# Patient Record
Sex: Male | Born: 1955 | Race: White | Hispanic: No | Smoking: Former smoker
Health system: Southern US, Community
[De-identification: ages and names within clinical notes are randomized; demographics above are authoritative.]

## PROBLEM LIST (undated history)

## (undated) DIAGNOSIS — J383 Other diseases of vocal cords: Secondary | ICD-10-CM

## (undated) DIAGNOSIS — I10 Essential (primary) hypertension: Secondary | ICD-10-CM

## (undated) DIAGNOSIS — R0600 Dyspnea, unspecified: Secondary | ICD-10-CM

## (undated) DIAGNOSIS — K760 Fatty (change of) liver, not elsewhere classified: Secondary | ICD-10-CM

## (undated) DIAGNOSIS — E785 Hyperlipidemia, unspecified: Secondary | ICD-10-CM

## (undated) DIAGNOSIS — IMO0002 Reserved for concepts with insufficient information to code with codable children: Secondary | ICD-10-CM

## (undated) DIAGNOSIS — I639 Cerebral infarction, unspecified: Secondary | ICD-10-CM

## (undated) DIAGNOSIS — I708 Atherosclerosis of other arteries: Secondary | ICD-10-CM

## (undated) DIAGNOSIS — K219 Gastro-esophageal reflux disease without esophagitis: Secondary | ICD-10-CM

## (undated) DIAGNOSIS — M1990 Unspecified osteoarthritis, unspecified site: Secondary | ICD-10-CM

## (undated) DIAGNOSIS — J449 Chronic obstructive pulmonary disease, unspecified: Secondary | ICD-10-CM

## (undated) DIAGNOSIS — F419 Anxiety disorder, unspecified: Secondary | ICD-10-CM

## (undated) DIAGNOSIS — Z720 Tobacco use: Secondary | ICD-10-CM

## (undated) DIAGNOSIS — M25511 Pain in right shoulder: Secondary | ICD-10-CM

## (undated) DIAGNOSIS — Z9289 Personal history of other medical treatment: Secondary | ICD-10-CM

## (undated) HISTORY — DX: Reserved for concepts with insufficient information to code with codable children: IMO0002

## (undated) HISTORY — DX: Hyperlipidemia, unspecified: E78.5

## (undated) HISTORY — DX: Essential (primary) hypertension: I10

## (undated) HISTORY — DX: Atherosclerosis of other arteries: I70.8

## (undated) HISTORY — PX: NECK SURGERY: SHX720

## (undated) HISTORY — DX: Personal history of other medical treatment: Z92.89

## (undated) HISTORY — DX: Tobacco use: Z72.0

## (undated) HISTORY — DX: Other diseases of vocal cords: J38.3

## (undated) HISTORY — DX: Gastro-esophageal reflux disease without esophagitis: K21.9

## (undated) HISTORY — DX: Cerebral infarction, unspecified: I63.9

## (undated) HISTORY — DX: Pain in right shoulder: M25.511

## (undated) HISTORY — DX: Fatty (change of) liver, not elsewhere classified: K76.0

## (undated) HISTORY — DX: Chronic obstructive pulmonary disease, unspecified: J44.9

## (undated) HISTORY — PX: HERNIA REPAIR: SHX51

---

## 1998-10-23 ENCOUNTER — Encounter: Admission: RE | Admit: 1998-10-23 | Discharge: 1998-10-23 | Payer: Self-pay | Admitting: *Deleted

## 1998-12-10 ENCOUNTER — Encounter: Admission: RE | Admit: 1998-12-10 | Discharge: 1998-12-10 | Payer: Self-pay | Admitting: Hematology and Oncology

## 1999-06-17 ENCOUNTER — Encounter: Admission: RE | Admit: 1999-06-17 | Discharge: 1999-06-17 | Payer: Self-pay | Admitting: Hematology and Oncology

## 1999-09-20 ENCOUNTER — Encounter: Payer: Self-pay | Admitting: Emergency Medicine

## 1999-09-20 ENCOUNTER — Emergency Department (HOSPITAL_COMMUNITY): Admission: EM | Admit: 1999-09-20 | Discharge: 1999-09-20 | Payer: Self-pay | Admitting: Emergency Medicine

## 1999-09-22 ENCOUNTER — Encounter: Admission: RE | Admit: 1999-09-22 | Discharge: 1999-09-22 | Payer: Self-pay | Admitting: Hematology and Oncology

## 2000-01-22 ENCOUNTER — Encounter: Admission: RE | Admit: 2000-01-22 | Discharge: 2000-01-22 | Payer: Self-pay | Admitting: Hematology and Oncology

## 2000-02-05 ENCOUNTER — Encounter: Admission: RE | Admit: 2000-02-05 | Discharge: 2000-02-05 | Payer: Self-pay | Admitting: Internal Medicine

## 2000-09-20 ENCOUNTER — Encounter: Admission: RE | Admit: 2000-09-20 | Discharge: 2000-09-20 | Payer: Self-pay | Admitting: Hematology and Oncology

## 2000-09-26 ENCOUNTER — Ambulatory Visit (HOSPITAL_COMMUNITY): Admission: RE | Admit: 2000-09-26 | Discharge: 2000-09-26 | Payer: Self-pay | Admitting: Cardiovascular Disease

## 2000-09-30 ENCOUNTER — Ambulatory Visit (HOSPITAL_COMMUNITY): Admission: RE | Admit: 2000-09-30 | Discharge: 2000-09-30 | Payer: Self-pay | Admitting: Cardiovascular Disease

## 2000-09-30 ENCOUNTER — Encounter: Payer: Self-pay | Admitting: Cardiovascular Disease

## 2000-10-31 ENCOUNTER — Encounter: Admission: RE | Admit: 2000-10-31 | Discharge: 2000-10-31 | Payer: Self-pay | Admitting: Internal Medicine

## 2001-02-06 ENCOUNTER — Encounter: Admission: RE | Admit: 2001-02-06 | Discharge: 2001-02-06 | Payer: Self-pay

## 2001-02-20 ENCOUNTER — Encounter: Admission: RE | Admit: 2001-02-20 | Discharge: 2001-02-20 | Payer: Self-pay | Admitting: Internal Medicine

## 2001-10-24 ENCOUNTER — Emergency Department (HOSPITAL_COMMUNITY): Admission: EM | Admit: 2001-10-24 | Discharge: 2001-10-24 | Payer: Self-pay | Admitting: Physical Therapy

## 2002-02-26 ENCOUNTER — Ambulatory Visit (HOSPITAL_COMMUNITY): Admission: RE | Admit: 2002-02-26 | Discharge: 2002-02-26 | Payer: Self-pay | Admitting: Internal Medicine

## 2002-02-26 ENCOUNTER — Encounter: Admission: RE | Admit: 2002-02-26 | Discharge: 2002-02-26 | Payer: Self-pay | Admitting: Internal Medicine

## 2002-05-09 ENCOUNTER — Encounter: Admission: RE | Admit: 2002-05-09 | Discharge: 2002-05-09 | Payer: Self-pay | Admitting: Internal Medicine

## 2002-05-28 ENCOUNTER — Encounter: Payer: Self-pay | Admitting: Internal Medicine

## 2002-05-28 ENCOUNTER — Ambulatory Visit (HOSPITAL_COMMUNITY): Admission: RE | Admit: 2002-05-28 | Discharge: 2002-05-28 | Payer: Self-pay | Admitting: Internal Medicine

## 2002-05-28 ENCOUNTER — Encounter: Admission: RE | Admit: 2002-05-28 | Discharge: 2002-05-28 | Payer: Self-pay | Admitting: Internal Medicine

## 2002-06-13 ENCOUNTER — Encounter: Admission: RE | Admit: 2002-06-13 | Discharge: 2002-06-13 | Payer: Self-pay | Admitting: Internal Medicine

## 2002-06-22 ENCOUNTER — Encounter: Admission: RE | Admit: 2002-06-22 | Discharge: 2002-06-22 | Payer: Self-pay | Admitting: Internal Medicine

## 2002-07-15 ENCOUNTER — Emergency Department (HOSPITAL_COMMUNITY): Admission: EM | Admit: 2002-07-15 | Discharge: 2002-07-15 | Payer: Self-pay | Admitting: Emergency Medicine

## 2002-07-17 ENCOUNTER — Encounter: Admission: RE | Admit: 2002-07-17 | Discharge: 2002-07-17 | Payer: Self-pay | Admitting: Internal Medicine

## 2002-07-24 ENCOUNTER — Encounter: Admission: RE | Admit: 2002-07-24 | Discharge: 2002-07-24 | Payer: Self-pay | Admitting: Internal Medicine

## 2002-10-22 ENCOUNTER — Encounter: Admission: RE | Admit: 2002-10-22 | Discharge: 2002-10-22 | Payer: Self-pay | Admitting: Internal Medicine

## 2002-11-26 ENCOUNTER — Encounter: Admission: RE | Admit: 2002-11-26 | Discharge: 2002-11-26 | Payer: Self-pay | Admitting: Internal Medicine

## 2003-02-18 ENCOUNTER — Encounter: Admission: RE | Admit: 2003-02-18 | Discharge: 2003-02-18 | Payer: Self-pay | Admitting: Internal Medicine

## 2003-03-05 ENCOUNTER — Encounter: Admission: RE | Admit: 2003-03-05 | Discharge: 2003-03-05 | Payer: Self-pay | Admitting: Internal Medicine

## 2003-03-05 ENCOUNTER — Ambulatory Visit (HOSPITAL_COMMUNITY): Admission: RE | Admit: 2003-03-05 | Discharge: 2003-03-05 | Payer: Self-pay | Admitting: Internal Medicine

## 2003-08-12 ENCOUNTER — Encounter: Admission: RE | Admit: 2003-08-12 | Discharge: 2003-08-12 | Payer: Self-pay | Admitting: Internal Medicine

## 2003-12-12 ENCOUNTER — Encounter: Admission: RE | Admit: 2003-12-12 | Discharge: 2003-12-12 | Payer: Self-pay | Admitting: Internal Medicine

## 2004-02-02 ENCOUNTER — Emergency Department (HOSPITAL_COMMUNITY): Admission: EM | Admit: 2004-02-02 | Discharge: 2004-02-02 | Payer: Self-pay | Admitting: Emergency Medicine

## 2004-07-02 ENCOUNTER — Encounter: Admission: RE | Admit: 2004-07-02 | Discharge: 2004-07-02 | Payer: Self-pay | Admitting: Internal Medicine

## 2004-07-03 ENCOUNTER — Ambulatory Visit (HOSPITAL_COMMUNITY): Admission: RE | Admit: 2004-07-03 | Discharge: 2004-07-03 | Payer: Self-pay | Admitting: Internal Medicine

## 2004-07-23 ENCOUNTER — Ambulatory Visit: Payer: Self-pay | Admitting: Internal Medicine

## 2004-09-15 ENCOUNTER — Ambulatory Visit: Payer: Self-pay | Admitting: Internal Medicine

## 2004-09-15 ENCOUNTER — Ambulatory Visit (HOSPITAL_COMMUNITY): Admission: RE | Admit: 2004-09-15 | Discharge: 2004-09-15 | Payer: Self-pay | Admitting: Internal Medicine

## 2004-12-25 ENCOUNTER — Emergency Department (HOSPITAL_COMMUNITY): Admission: EM | Admit: 2004-12-25 | Discharge: 2004-12-25 | Payer: Self-pay | Admitting: Family Medicine

## 2005-08-06 ENCOUNTER — Ambulatory Visit: Payer: Self-pay | Admitting: Internal Medicine

## 2006-02-11 ENCOUNTER — Ambulatory Visit: Payer: Self-pay | Admitting: Internal Medicine

## 2006-02-13 ENCOUNTER — Ambulatory Visit (HOSPITAL_COMMUNITY): Admission: RE | Admit: 2006-02-13 | Discharge: 2006-02-13 | Payer: Self-pay | Admitting: Internal Medicine

## 2006-02-14 ENCOUNTER — Ambulatory Visit: Payer: Self-pay | Admitting: Internal Medicine

## 2006-07-15 ENCOUNTER — Emergency Department (HOSPITAL_COMMUNITY): Admission: EM | Admit: 2006-07-15 | Discharge: 2006-07-16 | Payer: Self-pay | Admitting: Emergency Medicine

## 2006-07-16 ENCOUNTER — Emergency Department (HOSPITAL_COMMUNITY): Admission: EM | Admit: 2006-07-16 | Discharge: 2006-07-16 | Payer: Self-pay | Admitting: Emergency Medicine

## 2006-08-18 ENCOUNTER — Emergency Department (HOSPITAL_COMMUNITY): Admission: EM | Admit: 2006-08-18 | Discharge: 2006-08-18 | Payer: Self-pay | Admitting: Emergency Medicine

## 2006-08-20 ENCOUNTER — Emergency Department (HOSPITAL_COMMUNITY): Admission: EM | Admit: 2006-08-20 | Discharge: 2006-08-20 | Payer: Self-pay | Admitting: Emergency Medicine

## 2006-08-29 ENCOUNTER — Ambulatory Visit: Payer: Self-pay | Admitting: Internal Medicine

## 2006-09-05 ENCOUNTER — Encounter (INDEPENDENT_AMBULATORY_CARE_PROVIDER_SITE_OTHER): Payer: Self-pay | Admitting: Internal Medicine

## 2006-09-05 ENCOUNTER — Ambulatory Visit: Payer: Self-pay | Admitting: Internal Medicine

## 2006-09-05 LAB — CONVERTED CEMR LAB
CO2: 28 meq/L (ref 19–32)
Calcium: 9.5 mg/dL (ref 8.4–10.5)
Chloride: 103 meq/L (ref 96–112)
Creatinine, Ser: 0.9 mg/dL (ref 0.40–1.50)
Glucose, Bld: 70 mg/dL (ref 70–99)
Sodium: 140 meq/L (ref 135–145)
VLDL: 36 mg/dL (ref 0–40)

## 2006-09-12 ENCOUNTER — Ambulatory Visit: Payer: Self-pay | Admitting: *Deleted

## 2006-09-20 DIAGNOSIS — J383 Other diseases of vocal cords: Secondary | ICD-10-CM | POA: Insufficient documentation

## 2006-09-20 DIAGNOSIS — F1420 Cocaine dependence, uncomplicated: Secondary | ICD-10-CM | POA: Insufficient documentation

## 2006-09-20 DIAGNOSIS — I10 Essential (primary) hypertension: Secondary | ICD-10-CM | POA: Insufficient documentation

## 2006-09-20 DIAGNOSIS — E785 Hyperlipidemia, unspecified: Secondary | ICD-10-CM | POA: Insufficient documentation

## 2006-09-20 DIAGNOSIS — K219 Gastro-esophageal reflux disease without esophagitis: Secondary | ICD-10-CM | POA: Insufficient documentation

## 2006-09-20 DIAGNOSIS — F17200 Nicotine dependence, unspecified, uncomplicated: Secondary | ICD-10-CM | POA: Insufficient documentation

## 2006-12-13 ENCOUNTER — Ambulatory Visit: Payer: Self-pay | Admitting: Internal Medicine

## 2006-12-13 DIAGNOSIS — M25519 Pain in unspecified shoulder: Secondary | ICD-10-CM | POA: Insufficient documentation

## 2007-09-03 ENCOUNTER — Emergency Department (HOSPITAL_COMMUNITY): Admission: EM | Admit: 2007-09-03 | Discharge: 2007-09-03 | Payer: Self-pay | Admitting: Emergency Medicine

## 2007-09-08 ENCOUNTER — Ambulatory Visit: Payer: Self-pay | Admitting: *Deleted

## 2008-01-29 ENCOUNTER — Telehealth (INDEPENDENT_AMBULATORY_CARE_PROVIDER_SITE_OTHER): Payer: Self-pay | Admitting: *Deleted

## 2008-04-01 ENCOUNTER — Telehealth (INDEPENDENT_AMBULATORY_CARE_PROVIDER_SITE_OTHER): Payer: Self-pay | Admitting: *Deleted

## 2008-10-15 ENCOUNTER — Telehealth (INDEPENDENT_AMBULATORY_CARE_PROVIDER_SITE_OTHER): Payer: Self-pay | Admitting: *Deleted

## 2009-01-07 ENCOUNTER — Telehealth (INDEPENDENT_AMBULATORY_CARE_PROVIDER_SITE_OTHER): Payer: Self-pay | Admitting: *Deleted

## 2009-01-19 ENCOUNTER — Emergency Department (HOSPITAL_COMMUNITY): Admission: EM | Admit: 2009-01-19 | Discharge: 2009-01-19 | Payer: Self-pay | Admitting: Emergency Medicine

## 2009-01-31 ENCOUNTER — Ambulatory Visit: Payer: Self-pay | Admitting: Internal Medicine

## 2009-01-31 ENCOUNTER — Encounter (INDEPENDENT_AMBULATORY_CARE_PROVIDER_SITE_OTHER): Payer: Self-pay | Admitting: *Deleted

## 2009-01-31 LAB — CONVERTED CEMR LAB
BUN: 11 mg/dL (ref 6–23)
CO2: 25 meq/L (ref 19–32)
Chloride: 105 meq/L (ref 96–112)
Cocaine Metabolites: NEGATIVE
Creatinine, Ser: 0.99 mg/dL (ref 0.40–1.50)
Creatinine,U: 57.7 mg/dL
Glucose, Bld: 87 mg/dL (ref 70–99)
Leukocytes, UA: NEGATIVE
Nitrite: NEGATIVE
Phencyclidine (PCP): NEGATIVE
Potassium: 3.5 meq/L (ref 3.5–5.3)
Propoxyphene: NEGATIVE
Protein, ur: NEGATIVE mg/dL
RBC / HPF: NONE SEEN (ref <3)
Total Bilirubin: 0.2 mg/dL — ABNORMAL LOW (ref 0.3–1.2)
Urine Glucose: NEGATIVE mg/dL
Urobilinogen, UA: 0.2 (ref 0.0–1.0)

## 2009-03-31 ENCOUNTER — Emergency Department (HOSPITAL_COMMUNITY): Admission: EM | Admit: 2009-03-31 | Discharge: 2009-04-01 | Payer: Self-pay | Admitting: Emergency Medicine

## 2009-05-27 ENCOUNTER — Telehealth: Payer: Self-pay | Admitting: *Deleted

## 2009-05-28 ENCOUNTER — Ambulatory Visit: Payer: Self-pay | Admitting: Infectious Diseases

## 2009-05-28 ENCOUNTER — Encounter: Payer: Self-pay | Admitting: Internal Medicine

## 2009-05-28 LAB — CONVERTED CEMR LAB
CO2: 22 meq/L (ref 19–32)
Chloride: 110 meq/L (ref 96–112)
Cholesterol: 176 mg/dL (ref 0–200)
Glucose, Bld: 85 mg/dL (ref 70–99)
LDL Cholesterol: 109 mg/dL — ABNORMAL HIGH (ref 0–99)
Potassium: 3.4 meq/L — ABNORMAL LOW (ref 3.5–5.3)
Triglycerides: 180 mg/dL — ABNORMAL HIGH (ref <150)
VLDL: 36 mg/dL (ref 0–40)

## 2009-06-24 ENCOUNTER — Emergency Department (HOSPITAL_COMMUNITY): Admission: EM | Admit: 2009-06-24 | Discharge: 2009-06-24 | Payer: Self-pay | Admitting: Emergency Medicine

## 2009-12-01 ENCOUNTER — Telehealth: Payer: Self-pay | Admitting: Internal Medicine

## 2009-12-24 ENCOUNTER — Ambulatory Visit: Payer: Self-pay | Admitting: Internal Medicine

## 2009-12-24 DIAGNOSIS — J449 Chronic obstructive pulmonary disease, unspecified: Secondary | ICD-10-CM | POA: Insufficient documentation

## 2009-12-24 LAB — CONVERTED CEMR LAB
ALT: 15 units/L (ref 0–53)
AST: 19 units/L (ref 0–37)
BUN: 19 mg/dL (ref 6–23)
Chloride: 106 meq/L (ref 96–112)
Cholesterol: 184 mg/dL (ref 0–200)
Creatinine, Ser: 1.03 mg/dL (ref 0.40–1.50)
Glucose, Bld: 83 mg/dL (ref 70–99)
LDL Cholesterol: 90 mg/dL (ref 0–99)
Potassium: 3.9 meq/L (ref 3.5–5.3)
Sodium: 143 meq/L (ref 135–145)
Triglycerides: 321 mg/dL — ABNORMAL HIGH (ref <150)

## 2010-05-10 ENCOUNTER — Emergency Department (HOSPITAL_COMMUNITY): Admission: EM | Admit: 2010-05-10 | Discharge: 2010-05-11 | Payer: Self-pay | Admitting: Emergency Medicine

## 2010-05-12 ENCOUNTER — Emergency Department (HOSPITAL_COMMUNITY): Admission: EM | Admit: 2010-05-12 | Discharge: 2010-05-12 | Payer: Self-pay | Admitting: Emergency Medicine

## 2010-12-02 ENCOUNTER — Telehealth: Payer: Self-pay | Admitting: Internal Medicine

## 2010-12-22 NOTE — Progress Notes (Signed)
Summary: med refill/gp  Phone Note Refill Request Message from:  Fax from Pharmacy on December 01, 2009 12:39 PM  Refills Requested: Medication #1:  HYDROCHLOROTHIAZIDE 25 MG TABS Take 1 tablet by mouth once a day   Last Refilled: 10/25/2009  Medication #2:  BENAZEPRIL HCL 40 MG TABS Take 1 tablet by mouth once a day   Last Refilled: 10/25/2009  Method Requested: Electronic Initial call taken by: Chinita Pester RN,  December 01, 2009 12:39 PM  Follow-up for Phone Call        I will authorize just 1 month of medication for this patient, since we have no follow him since July 2010. He needs an appointment and lab work in order to received any further refills    Prescriptions: AMLODIPINE BESYLATE 5 MG TABS (AMLODIPINE BESYLATE) Take 1 tablet by mouth once a day  #31 x 0   Entered and Authorized by:   Vassie Loll MD   Signed by:   Vassie Loll MD on 12/01/2009   Method used:   Electronically to        [facility]  [phone]* (retail)       [address]       [place], [place]  [address]       Ph: [phone] or [phone]       Fax: [phone]   RxID:   [id number] ADVAIR DISKUS 250-50 MCG/DOSE MISC (FLUTICASONE-SALMETEROL) Inhale 1 puff two times a day  #1 month x 0   Entered and Authorized by:   Vassie Loll MD   Signed by:   Vassie Loll MD on 12/01/2009   Method used:   Electronically to        [facility]  [phone]* (retail)       [address]       [place], [place]  [address]       Ph: [phone] or [phone]       Fax: [phone]   RxID:   [id number] BENAZEPRIL HCL 40 MG TABS (BENAZEPRIL HCL) Take 1 tablet by mouth once a day  #30 x 0   Entered and Authorized by:   Vassie Loll MD   Signed by:   Vassie Loll MD on 12/01/2009   Method used:   Electronically to        [facility]  [phone]* (retail)       [address]       [place], [place]  [address]       Ph: [phone] or [phone]       Fax: [phone]   RxID:   [id number] HYDROCHLOROTHIAZIDE 25 MG TABS (HYDROCHLOROTHIAZIDE) Take 1 tablet by mouth once a day  #30 x 0   Entered and Authorized by:   Vassie Loll MD   Signed by:   Vassie Loll MD on 12/01/2009   Method used:   Electronically to        [facility]  [phone]* (retail)       [address]       [place], [place]  [address]       Ph: [phone] or [phone]       Fax: [phone]   RxID:   [id number]

## 2010-12-22 NOTE — Assessment & Plan Note (Signed)
Summary: HTN/labs and med refill/gg   Vital Signs:  Patient profile:   55 year old male Height:      71 inches (180.34 cm) Weight:      224.5 pounds (102.05 kg) BMI:     31.42 O2 Sat:      95 % on Room air Temp:     98.9 degrees F (37.17 degrees C) oral Pulse rate:   62 / minute BP sitting:   132 / 80  (right arm)  Vitals Entered By: Stanton Kidney Ditzler RN (December 24, 2009 10:53 AM)  O2 Flow:  Room air Is Patient Diabetic? No Pain Assessment Patient in pain? no      Nutritional Status BMI of > 30 = obese Nutritional Status Detail appetite good  Have you ever been in a relationship where you felt threatened, hurt or afraid?denies   Does patient need assistance? Functional Status Self care Ambulation Normal Comments Refills on meds and ck lungs - ? wheezing.   Primary Care Provider:  Rufina Falco MD   History of Present Illness: Pt is a 55 year old man with PMH significant for HTN, HLD, tobacco abuse, GERD, vocal cord disorder, and hx of illicit drug use; who presents to clinic for followup of his chronic problems and also to received refill of his medications.  He only complaint is mild wheezing and mild SOB, especially during exertion and when he is not compliant with his advair.  He continue to smoke one pack per day.    He is compliant with his medications and reports been clean of alcohol and drugs for the last 12 months.    Of note, he has been taking prilosec OTC for her GERD but reports still having some reflux symptoms.       Depression History:      The patient denies a depressed mood most of the day and a diminished interest in his usual daily activities.        The patient denies that he feels like life is not worth living, denies that he wishes that he were dead, and denies that he has thought about ending his life.         Preventive Screening-Counseling & Management  Alcohol-Tobacco     Smoking Status: current     Smoking Cessation Counseling: yes  Packs/Day: 1 ppd     Year Started: 35years  Caffeine-Diet-Exercise     Does Patient Exercise: no  Problems Prior to Update: 1)  COPD  (ICD-496) 2)  Shoulder Pain, Right  (ICD-719.41) 3)  Family History Diabetes 1st Degree Relative  (ICD-V18.0) 4)  Family History of Cad Male 1st Degree Relative <60  (ICD-V16.49) 5)  Hypertension  (ICD-401.9) 6)  Hyperlipidemia  (ICD-272.4) 7)  Tobacco Abuse  (ICD-305.1) 8)  Dependence, Cocaine, Unspecified  (ICD-304.20) 9)  Vocal Cord Disorder  (ICD-478.5) 10)  Gerd  (ICD-530.81)  Current Problems (verified): 1)  Shoulder Pain, Right  (ICD-719.41) 2)  Family History Diabetes 1st Degree Relative  (ICD-V18.0) 3)  Family History of Cad Male 1st Degree Relative <60  (ICD-V16.49) 4)  Hypertension  (ICD-401.9) 5)  Hyperlipidemia  (ICD-272.4) 6)  Tobacco Abuse  (ICD-305.1) 7)  Dependence, Cocaine, Unspecified  (ICD-304.20) 8)  Vocal Cord Disorder  (ICD-478.5) 9)  Gerd  (ICD-530.81)  Medications Prior to Update: 1)  Hydrochlorothiazide 25 Mg Tabs (Hydrochlorothiazide) .... Take 1 Tablet By Mouth Once A Day 2)  Benazepril Hcl 40 Mg Tabs (Benazepril Hcl) .... Take 1 Tablet By Mouth  Once A Day 3)  Aspirin 325 Mg Tabs (Aspirin) .... Take 1 Tablet By Mouth Once A Day 4)  Gemfibrozil 600 Mg Tabs (Gemfibrozil) .... Take 1 Tablet By Mouth Two Times A Day 5)  Amlodipine Besylate 5 Mg Tabs (Amlodipine Besylate) .... Take 1 Tablet By Mouth Once A Day 6)  Advair Diskus 250-50 Mcg/dose Misc (Fluticasone-Salmeterol) .... Inhale 1 Puff Two Times A Day 7)  Albuterol 90 Mcg/act  Aers (Albuterol) .... Take 2 Puffs Every 6 Hours As Needed For Shortness of Breath 8)  Prevacid Solutab 30 Mg Tbdp (Lansoprazole) .... Take 1 Tablet By Mouth Two Times A Day 9)  Cialis 10 Mg Tabs (Tadalafil) .... As Needed Only Up To 10 Times A Month.avoid Using Nitroglycerine  Current Medications (verified): 1)  Hydrochlorothiazide 25 Mg Tabs (Hydrochlorothiazide) .... Take 1 Tablet By  Mouth Once A Day 2)  Benazepril Hcl 40 Mg Tabs (Benazepril Hcl) .... Take 1 Tablet By Mouth Once A Day 3)  Amlodipine Besylate 5 Mg Tabs (Amlodipine Besylate) .... Take 1 Tablet By Mouth Once A Day 4)  Aspir-Low 81 Mg Tbec (Aspirin) .... Take 1 Tablet By Mouth Once A Day 5)  Advair Diskus 250-50 Mcg/dose Misc (Fluticasone-Salmeterol) .... Inhale 1 Puff Two Times A Day 6)  Albuterol 90 Mcg/act  Aers (Albuterol) .... Take 2 Puffs Every 6 Hours As Needed For Shortness of Breath 7)  Prevacid Solutab 30 Mg Tbdp (Lansoprazole) .... Take 1 Tablet By Mouth Two Times A Day 8)  Cialis 10 Mg Tabs (Tadalafil) .... As Needed Only Up To 10 Times A Month.avoid Using Nitroglycerine  Allergies (verified): No Known Drug Allergies  Past History:  Past Medical History: Last updated: 09/08/2007 Current Problems:  HYPERTENSION (ICD-401.9) HYPERLIPIDEMIA (ICD-272.4) TOBACCO ABUSE (ICD-305.1) DEPENDENCE, COCAINE, UNSPECIFIED (ICD-304.20) VOCAL CORD DISORDER (ICD-478.5) GERD (ICD-530.81) H/o Right shoulder pain sec to partial rotator cuff tear, tendinopathy, mild subacromial/subdeltoid  bursitis, A/C joint arthropathy per MRI done in 3/07 Bronchitis  Family History: Last updated: 12/13/2006 Family History of CAD Male 1st degree relative <60 Family History Diabetes 1st degree relative  Social History: Last updated: 12/13/2006 Single Divorced Current Smoker Alcohol use-no  Risk Factors: Smoking Status: current (12/24/2009) Packs/Day: 1 ppd (12/24/2009)  Social History: Packs/Day:  1 ppd  Review of Systems  The patient denies anorexia, fever, chest pain, syncope, prolonged cough, headaches, hemoptysis, abdominal pain, melena, hematochezia, hematuria, difficulty walking, depression, abnormal bleeding, and enlarged lymph nodes.    Physical Exam  General:  alert, well-developed, well-nourished, and well-hydrated.   Lungs:  normal respiratory effort, no intercostal retractions, no accessory  muscle use, no crackles, and no wheezes.   Heart:  normal rate, regular rhythm, no murmur, no gallop, and no rub.   Abdomen:  soft, non-tender, and normal bowel sounds.   Msk:  normal ROM, no joint tenderness, no joint swelling, no joint warmth, no joint deformities, and no crepitation.   Extremities:  No clubbing, cyanosis, edema, or deformity noted with normal full range of motion of all joints.   Neurologic:  alert & oriented X3, cranial nerves II-XII intact, strength normal in all extremities, sensation intact to pinprick, and gait normal.     Impression & Recommendations:  Problem # 1:  COPD (ICD-496) Patient COPD is stable and well controlled with his albuterol and advair regimen. Patient is still smoking. He is not wheezing today and his O2 sat was 95%. Willcontinue using advair and albuterol as needed; patient advised to quit smoking and to be compliant with  his medication regimen.  His updated medication list for this problem includes:    Advair Diskus 250-50 Mcg/dose Misc (Fluticasone-salmeterol) ..... Inhale 1 puff two times a day    Albuterol 90 Mcg/act Aers (Albuterol) .Marland Kitchen... Take 2 puffs every 6 hours as needed for shortness of breath  Problem # 2:  HYPERTENSION (ICD-401.9) BP well controlled. Will check renalfunction and electrolytes, will recommend smoking cessation and will refill his medications in order to continue using HCTZ, lisinopril and amlodipine. Pt was also advised to follow a low sodium diet.  His updated medication list for this problem includes:    Hydrochlorothiazide 25 Mg Tabs (Hydrochlorothiazide) .Marland Kitchen... Take 1 tablet by mouth once a day    Benazepril Hcl 40 Mg Tabs (Benazepril hcl) .Marland Kitchen... Take 1 tablet by mouth once a day    Amlodipine Besylate 5 Mg Tabs (Amlodipine besylate) .Marland Kitchen... Take 1 tablet by mouth once a day  Orders: T-Comprehensive Metabolic Panel ([id number])  Problem # 3:  HYPERLIPIDEMIA (ICD-272.4) Pt has been using gemfibrozilforhis elevated  triglycerides. Will check lipid profile and LFT's today and will decide if he will also need any statins to control his LDL as well. Patient advised to be compliant with hismediations and to follow a low fat diet.  The following medications were removed from the medication list:    Gemfibrozil 600 Mg Tabs (Gemfibrozil) .Marland Kitchen... Take 1 tablet by mouth two times a day  Orders: T-Lipid Profile ([id number])  Problem # 4:  GERD (ICD-530.81) Patient with mildsymptoms of reflux, which has not been relieved completely by prilosec. Will recommend prevacid two times a day and willalso provide information regarding lifestyle modification changes.  His updated medication list for this problem includes:    Prevacid Solutab 30 Mg Tbdp (Lansoprazole) .Marland Kitchen... Take 1 tablet by mouth two times a day  Complete Medication List: 1)  Hydrochlorothiazide 25 Mg Tabs (Hydrochlorothiazide) .... Take 1 tablet by mouth once a day 2)  Benazepril Hcl 40 Mg Tabs (Benazepril hcl) .... Take 1 tablet by mouth once a day 3)  Amlodipine Besylate 5 Mg Tabs (Amlodipine besylate) .... Take 1 tablet by mouth once a day 4)  Aspir-low 81 Mg Tbec (Aspirin) .... Take 1 tablet by mouth once a day 5)  Advair Diskus 250-50 Mcg/dose Misc (Fluticasone-salmeterol) .... Inhale 1 puff two times a day 6)  Albuterol 90 Mcg/act Aers (Albuterol) .... Take 2 puffs every 6 hours as needed for shortness of breath 7)  Prevacid Solutab 30 Mg Tbdp (Lansoprazole) .... Take 1 tablet by mouth two times a day 8)  Cialis 10 Mg Tabs (Tadalafil) .... As needed only up to 10 times a month.avoid using nitroglycerine  Other Orders: T-Hemoccult Card-Multiple (take home) (02725)  Patient Instructions: 1)  Please schedule a follow-up appointment in 6 months; unless abnormallabs found onyour labs results and we need to see you sooner. 2)  Limit your Sodium (Salt) and follow a low fat diet. 3)  Avoid foods high in acid (tomatoes, citrus juices, spicy foods). Avoid  eating within two hours of lying down or before exercising. Do not over eat; try smaller more frequent meals. Elevate head of bed twelve inches when sleeping. 4)  Tobacco is very bad for your health and your loved ones! You Should stop smoking!. 5)  Stop Smoking Tips: Choose a Quit date. Cut down before the Quit date. decide what you will do as a substitute when you feel the urge to smoke(gum,toothpick,exercise). 6)  You will be called with any abnormalities in  the tests scheduled or performed today.  If you don't hear from [place] within a week from when the test was performed, you can assume that your test was normal. Prescriptions: PREVACID SOLUTAB 30 MG TBDP (LANSOPRAZOLE) Take 1 tablet by mouth two times a day  #62 x 6   Entered and Authorized by:   Vassie Loll MD   Signed by:   Vassie Loll MD on 12/24/2009   Method used:   Print then Give to Patient   RxID:   [id number] ALBUTEROL 90 MCG/ACT  AERS (ALBUTEROL) Take 2 puffs every 6 hours as needed for Shortness of breath  #1 month x 11   Entered and Authorized by:   Vassie Loll MD   Signed by:   Vassie Loll MD on 12/24/2009   Method used:   Print then Give to Patient   RxID:   [id number] ADVAIR DISKUS 250-50 MCG/DOSE MISC (FLUTICASONE-SALMETEROL) Inhale 1 puff two times a day  #1 x 5   Entered and Authorized by:   Vassie Loll MD   Signed by:   Vassie Loll MD on 12/24/2009   Method used:   Print then Give to Patient   RxID:   [id number] AMLODIPINE BESYLATE 5 MG TABS (AMLODIPINE BESYLATE) Take 1 tablet by mouth once a day  #31 x 8   Entered and Authorized by:   Vassie Loll MD   Signed by:   Vassie Loll MD on 12/24/2009   Method used:   Print then Give to Patient   RxID:   [id number] BENAZEPRIL HCL 40 MG TABS (BENAZEPRIL HCL) Take 1 tablet by mouth once a day  #30 x 8   Entered and Authorized by:   Vassie Loll MD   Signed by:   Vassie Loll MD on 12/24/2009   Method used:   Print then Give to  Patient   RxID:   [id number] HYDROCHLOROTHIAZIDE 25 MG TABS (HYDROCHLOROTHIAZIDE) Take 1 tablet by mouth once a day  #30 x 8   Entered and Authorized by:   Vassie Loll MD   Signed by:   Vassie Loll MD on 12/24/2009   Method used:   Print then Give to Patient   RxID:   [id number]  Process Orders Check Orders Results:     [facility]: ABN not required for this insurance Tests Sent for requisitioning (December 24, 2009 5:09 PM):     12/24/2009: [facility] -- T-Lipid Profile [phone] (signed)     12/24/2009: [facility] -- T-Comprehensive Metabolic Panel [phone] (signed)    Prevention & Chronic Care Immunizations   Influenza vaccine: Not documented   Influenza vaccine deferral: Deferred  (05/28/2009)   Influenza vaccine due: 07/23/2009    Tetanus booster: Not documented   Td booster deferral: Deferred  (05/28/2009)    Pneumococcal vaccine: Not documented   Pneumococcal vaccine deferral: Deferred  (05/28/2009)  Colorectal Screening   Hemoccult: Not documented   Hemoccult action/deferral: Ordered  (12/24/2009)    Colonoscopy: Not documented   Colonoscopy action/deferral: Deferred  (05/28/2009)  Other Screening   PSA: Not documented   PSA action/deferral: Discussed-decision deferred  (05/28/2009)   Smoking status: current  (12/24/2009)   Smoking cessation counseling: yes  (12/24/2009)  Lipids   Total Cholesterol: 176  (05/28/2009)   Lipid panel action/deferral: Lipid Panel ordered   LDL: 109  (05/28/2009)   LDL Direct: Not documented   HDL: 31  (05/28/2009)   Triglycerides: 180  (05/28/2009)    SGOT (AST): 17  (  01/31/2009)   SGPT (ALT): 18  (01/31/2009) CMP ordered    Alkaline phosphatase: 78  (01/31/2009)   Total bilirubin: 0.2  (01/31/2009)    Lipid flowsheet reviewed?: Yes   Progress toward LDL goal: At goal  Hypertension   Last Blood Pressure: 132 / 80  (12/24/2009)   Serum  creatinine: 1.05  (05/28/2009)   BMP action: Ordered   Serum potassium 3.4  (05/28/2009) CMP ordered     Hypertension flowsheet reviewed?: Yes   Progress toward BP goal: At goal  Self-Management Support :    Patient will work on the following items until the next clinic visit to reach self-care goals:     Medications and monitoring: take my medicines every day, bring all of my medications to every visit  (12/24/2009)     Eating: eat more vegetables, use fresh or frozen vegetables, eat fruit for snacks and desserts, limit or avoid alcohol  (12/24/2009)     Activity: take a 30 minute walk every day  (12/24/2009)    Hypertension self-management support: Written self-care plan  (12/24/2009)   Hypertension self-care plan printed.    Lipid self-management support: Written self-care plan  (12/24/2009)   Lipid self-care plan printed.   Nursing Instructions: Provide Hemoccult cards with instructions (see order)

## 2010-12-24 NOTE — Progress Notes (Signed)
Summary: Refill/gh  Phone Note Refill Request Message from:  Fax from Pharmacy on December 02, 2010 11:11 AM  Refills Requested: Medication #1:  BENAZEPRIL HCL 40 MG TABS Take 1 tablet by mouth once a day   Dosage confirmed as above?Dosage Confirmed   Brand Name Necessary? No   Supply Requested: 1 year   Last Refilled: 10/29/2010  Medication #2:  AMLODIPINE BESYLATE 5 MG TABS Take 1 tablet by mouth once a day   Dosage confirmed as above?Dosage Confirmed   Brand Name Necessary? No   Supply Requested: 1 year   Last Refilled: 10/29/2010  Medication #3:  HYDROCHLOROTHIAZIDE 25 MG TABS Take 1 tablet by mouth once a day   Dosage confirmed as above?Dosage Confirmed   Brand Name Necessary? No   Supply Requested: 1 year   Last Refilled: 10/29/2010  Method Requested: Electronic Initial call taken by: Angelina Ok RN,  December 02, 2010 11:11 AM  Follow-up for Phone Call        Rx faxed to pharmacy. Follow-up by: Lars Mage MD,  December 02, 2010 12:25 PM    Prescriptions: AMLODIPINE BESYLATE 5 MG TABS (AMLODIPINE BESYLATE) Take 1 tablet by mouth once a day  #31 x 11   Entered and Authorized by:   Lars Mage MD   Signed by:   Lars Mage MD on 12/02/2010   Method used:   Faxed to ...       [facility]  [address]  [phone]* (retail)       [address]       [place], [place]  [address]       Ph: [phone] or [phone]       Fax: [phone]   RxID:   [id number] HYDROCHLOROTHIAZIDE 25 MG TABS (HYDROCHLOROTHIAZIDE) Take 1 tablet by mouth once a day  #30 x 11   Entered and Authorized by:   Lars Mage MD   Signed by:   Lars Mage MD on 12/02/2010   Method used:   Faxed to ...       [facility]  [address]  [phone]* (retail)       [address]       [place], [place]  [address]       Ph: [phone] or [phone]       Fax: [phone]   RxID:   [id number] BENAZEPRIL HCL 40 MG TABS (BENAZEPRIL HCL) Take 1 tablet by  mouth once a day  #30 x 11   Entered and Authorized by:   Lars Mage MD   Signed by:   Lars Mage MD on 12/02/2010   Method used:   Faxed to ...       [facility]  [address]  [phone]* (retail)       [address]       [place], [place]  [address]       Ph: [phone] or [phone]       Fax: [phone]   RxID:   [id number]

## 2011-02-01 ENCOUNTER — Other Ambulatory Visit: Payer: Self-pay | Admitting: *Deleted

## 2011-02-01 MED ORDER — ALBUTEROL 90 MCG/ACT IN AERS: 2.0000 | INHALATION_SPRAY | Freq: Four times a day (QID) | RESPIRATORY_TRACT | Status: DC | PRN

## 2011-02-07 LAB — URINALYSIS, ROUTINE W REFLEX MICROSCOPIC
Bilirubin Urine: NEGATIVE
Protein, ur: NEGATIVE mg/dL
Urobilinogen, UA: 0.2 mg/dL (ref 0.0–1.0)
pH: 5.5 (ref 5.0–8.0)

## 2011-02-07 LAB — URINE MICROSCOPIC-ADD ON

## 2011-02-11 ENCOUNTER — Encounter: Payer: Self-pay | Admitting: Internal Medicine

## 2011-03-02 LAB — URINALYSIS, ROUTINE W REFLEX MICROSCOPIC
Ketones, ur: NEGATIVE mg/dL
Protein, ur: NEGATIVE mg/dL
Specific Gravity, Urine: 1.016 (ref 1.005–1.030)

## 2011-03-02 LAB — URINE CULTURE: Colony Count: NO GROWTH

## 2011-03-02 LAB — URINE MICROSCOPIC-ADD ON

## 2011-03-09 LAB — DIFFERENTIAL
Basophils Absolute: 0.1 10*3/uL (ref 0.0–0.1)
Basophils Relative: 0 % (ref 0–1)
Eosinophils Absolute: 0.2 10*3/uL (ref 0.0–0.7)
Eosinophils Relative: 2 % (ref 0–5)
Lymphocytes Relative: 28 % (ref 12–46)
Lymphs Abs: 3.8 K/uL (ref 0.7–4.0)
Monocytes Absolute: 1.1 K/uL — ABNORMAL HIGH (ref 0.1–1.0)
Monocytes Relative: 8 % (ref 3–12)
Neutro Abs: 8.4 10*3/uL — ABNORMAL HIGH (ref 1.7–7.7)
Neutrophils Relative %: 62 % (ref 43–77)

## 2011-03-09 LAB — POCT I-STAT, CHEM 8
Creatinine, Ser: 1.1 mg/dL (ref 0.4–1.5)
Glucose, Bld: 93 mg/dL (ref 70–99)
HCT: 43 % (ref 39.0–52.0)
Sodium: 141 mEq/L (ref 135–145)

## 2011-03-09 LAB — POCT CARDIAC MARKERS
CKMB, poc: 2.3 ng/mL (ref 1.0–8.0)
Myoglobin, poc: 104 ng/mL (ref 12–200)
Troponin i, poc: <0.05 ng/mL (ref 0.00–0.09)

## 2011-03-09 LAB — URINALYSIS, ROUTINE W REFLEX MICROSCOPIC
Ketones, ur: NEGATIVE mg/dL
Leukocytes, UA: NEGATIVE
Nitrite: NEGATIVE
Protein, ur: NEGATIVE mg/dL
Urobilinogen, UA: 1 mg/dL (ref 0.0–1.0)
pH: 6 (ref 5.0–8.0)

## 2011-03-09 LAB — CBC
HCT: 41.8 % (ref 39.0–52.0)
Hemoglobin: 14.5 g/dL (ref 13.0–17.0)
MCHC: 34.8 g/dL (ref 30.0–36.0)
MCV: 93.1 fL (ref 78.0–100.0)
Platelets: 178 K/uL (ref 150–400)
RBC: 4.49 MIL/uL (ref 4.22–5.81)
RDW: 12 % (ref 11.5–15.5)
WBC: 13.7 K/uL — ABNORMAL HIGH (ref 4.0–10.5)

## 2011-06-12 ENCOUNTER — Emergency Department (HOSPITAL_COMMUNITY)
Admission: EM | Admit: 2011-06-12 | Discharge: 2011-06-12 | Disposition: A | Discharge status: Home / Self Care | Payer: Self-pay | Attending: Emergency Medicine | Admitting: Emergency Medicine

## 2011-06-12 DIAGNOSIS — I10 Essential (primary) hypertension: Secondary | ICD-10-CM | POA: Insufficient documentation

## 2011-06-12 DIAGNOSIS — M545 Low back pain, unspecified: Secondary | ICD-10-CM | POA: Insufficient documentation

## 2011-06-12 DIAGNOSIS — M79609 Pain in unspecified limb: Secondary | ICD-10-CM | POA: Insufficient documentation

## 2011-11-07 ENCOUNTER — Other Ambulatory Visit: Payer: Self-pay | Admitting: Internal Medicine

## 2011-12-27 ENCOUNTER — Encounter (HOSPITAL_COMMUNITY): Payer: Self-pay | Admitting: *Deleted

## 2011-12-27 ENCOUNTER — Emergency Department (HOSPITAL_COMMUNITY): Payer: Self-pay

## 2011-12-27 ENCOUNTER — Emergency Department (HOSPITAL_COMMUNITY)
Admission: EM | Admit: 2011-12-27 | Discharge: 2011-12-27 | Disposition: A | Discharge status: Home / Self Care | Payer: Self-pay | Attending: Emergency Medicine | Admitting: Emergency Medicine

## 2011-12-27 DIAGNOSIS — F17200 Nicotine dependence, unspecified, uncomplicated: Secondary | ICD-10-CM | POA: Insufficient documentation

## 2011-12-27 DIAGNOSIS — M5030 Other cervical disc degeneration, unspecified cervical region: Secondary | ICD-10-CM | POA: Insufficient documentation

## 2011-12-27 DIAGNOSIS — Z79899 Other long term (current) drug therapy: Secondary | ICD-10-CM | POA: Insufficient documentation

## 2011-12-27 DIAGNOSIS — J449 Chronic obstructive pulmonary disease, unspecified: Secondary | ICD-10-CM | POA: Insufficient documentation

## 2011-12-27 DIAGNOSIS — S161XXA Strain of muscle, fascia and tendon at neck level, initial encounter: Secondary | ICD-10-CM

## 2011-12-27 DIAGNOSIS — S139XXA Sprain of joints and ligaments of unspecified parts of neck, initial encounter: Secondary | ICD-10-CM | POA: Insufficient documentation

## 2011-12-27 DIAGNOSIS — I10 Essential (primary) hypertension: Secondary | ICD-10-CM | POA: Insufficient documentation

## 2011-12-27 DIAGNOSIS — J4489 Other specified chronic obstructive pulmonary disease: Secondary | ICD-10-CM | POA: Insufficient documentation

## 2011-12-27 DIAGNOSIS — X58XXXA Exposure to other specified factors, initial encounter: Secondary | ICD-10-CM | POA: Insufficient documentation

## 2011-12-27 DIAGNOSIS — Z981 Arthrodesis status: Secondary | ICD-10-CM | POA: Insufficient documentation

## 2011-12-27 DIAGNOSIS — E785 Hyperlipidemia, unspecified: Secondary | ICD-10-CM | POA: Insufficient documentation

## 2011-12-27 MED ORDER — MORPHINE SULFATE 4 MG/ML IJ SOLN
4.0000 mg | Freq: Once | INTRAMUSCULAR | Status: AC
  Administered 2011-12-27: 4 mg via INTRAVENOUS
  Filled 2011-12-27: qty 1

## 2011-12-27 MED ORDER — OXYCODONE-ACETAMINOPHEN 5-325 MG PO TABS: 1.0000 | ORAL_TABLET | Freq: Four times a day (QID) | ORAL | Status: AC | PRN

## 2011-12-27 MED ORDER — OXYCODONE-ACETAMINOPHEN 5-325 MG PO TABS
1.0000 | ORAL_TABLET | Freq: Once | ORAL | Status: AC
  Administered 2011-12-27: 1 via ORAL
  Filled 2011-12-27: qty 1

## 2011-12-27 MED ORDER — PREDNISONE 10 MG PO TABS: 20.0000 mg | ORAL_TABLET | Freq: Two times a day (BID) | ORAL | Status: DC

## 2011-12-27 MED ORDER — PREDNISONE 20 MG PO TABS
60.0000 mg | ORAL_TABLET | Freq: Once | ORAL | Status: AC
  Administered 2011-12-27: 60 mg via ORAL
  Filled 2011-12-27: qty 3

## 2011-12-27 MED ORDER — CYCLOBENZAPRINE HCL 10 MG PO TABS: 10.0000 mg | ORAL_TABLET | Freq: Two times a day (BID) | ORAL | Status: AC | PRN

## 2011-12-27 MED ORDER — ONDANSETRON 4 MG PO TBDP
4.0000 mg | ORAL_TABLET | Freq: Once | ORAL | Status: AC
  Administered 2011-12-27: 4 mg via ORAL
  Filled 2011-12-27: qty 1
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
## 2011-12-27 NOTE — ED Notes (Signed)
Pt reports pain in neck since Saturday. Denies known injury.

## 2011-12-28 NOTE — ED Provider Notes (Signed)
Medical screening examination/treatment/procedure(s) were performed by non-physician practitioner and as supervising physician I was immediately available for consultation/collaboration.   Laray Anger, DO 12/28/11 1021

## 2012-03-08 ENCOUNTER — Emergency Department (HOSPITAL_COMMUNITY)
Admission: EM | Admit: 2012-03-08 | Discharge: 2012-03-08 | Disposition: A | Discharge status: Home / Self Care | Payer: Self-pay | Attending: Emergency Medicine | Admitting: Emergency Medicine

## 2012-03-08 ENCOUNTER — Encounter (HOSPITAL_COMMUNITY): Payer: Self-pay

## 2012-03-08 ENCOUNTER — Telehealth: Payer: Self-pay | Admitting: *Deleted

## 2012-03-08 ENCOUNTER — Emergency Department (HOSPITAL_COMMUNITY): Payer: Self-pay

## 2012-03-08 DIAGNOSIS — M549 Dorsalgia, unspecified: Secondary | ICD-10-CM | POA: Insufficient documentation

## 2012-03-08 DIAGNOSIS — M545 Low back pain, unspecified: Secondary | ICD-10-CM | POA: Insufficient documentation

## 2012-03-08 DIAGNOSIS — I10 Essential (primary) hypertension: Secondary | ICD-10-CM | POA: Insufficient documentation

## 2012-03-08 MED ORDER — DIAZEPAM 5 MG PO TABS
5.0000 mg | ORAL_TABLET | Freq: Once | ORAL | Status: AC
  Administered 2012-03-08: 5 mg via ORAL
  Filled 2012-03-08: qty 1

## 2012-03-08 MED ORDER — HYDROMORPHONE HCL PF 2 MG/ML IJ SOLN
2.0000 mg | Freq: Once | INTRAMUSCULAR | Status: AC
  Administered 2012-03-08: 2 mg via INTRAMUSCULAR
  Filled 2012-03-08: qty 1

## 2012-03-08 MED ORDER — OXYCODONE-ACETAMINOPHEN 5-325 MG PO TABS: 1.0000 | ORAL_TABLET | ORAL | Status: AC | PRN

## 2012-03-08 MED ORDER — DIAZEPAM 5 MG PO TABS: 5.0000 mg | ORAL_TABLET | Freq: Three times a day (TID) | ORAL | Status: DC | PRN

## 2012-03-08 NOTE — Telephone Encounter (Signed)
See note

## 2012-03-08 NOTE — Discharge Instructions (Signed)
Back Pain, Adult Low back pain is very common. About 1 in 5 people have back pain.The cause of low back pain is rarely dangerous. The pain often gets better over time.About half of people with a sudden onset of back pain feel better in just 2 weeks. About 8 in 10 people feel better by 6 weeks.  CAUSES Some common causes of back pain include:  Strain of the muscles or ligaments supporting the spine.   Wear and tear (degeneration) of the spinal discs.   Arthritis.   Direct injury to the back.  DIAGNOSIS Most of the time, the direct cause of low back pain is not known.However, back pain can be treated effectively even when the exact cause of the pain is unknown.Answering your caregiver's questions about your overall health and symptoms is one of the most accurate ways to make sure the cause of your pain is not dangerous. If your caregiver needs more information, he or she may order lab work or imaging tests (X-rays or MRIs).However, even if imaging tests show changes in your back, this usually does not require surgery. HOME CARE INSTRUCTIONS For many people, back pain returns.Since low back pain is rarely dangerous, it is often a condition that people can learn to manageon their own.   Remain active. It is stressful on the back to sit or stand in one place. Do not sit, drive, or stand in one place for more than 30 minutes at a time. Take short walks on level surfaces as soon as pain allows.Try to increase the length of time you walk each day.   Do not stay in bed.Resting more than 1 or 2 days can delay your recovery.   Do not avoid exercise or work.Your body is made to move.It is not dangerous to be active, even though your back may hurt.Your back will likely heal faster if you return to being active before your pain is gone.   Pay attention to your body when you bend and lift. Many people have less discomfortwhen lifting if they bend their knees, keep the load close to their  bodies,and avoid twisting. Often, the most comfortable positions are those that put less stress on your recovering back.   Find a comfortable position to sleep. Use a firm mattress and lie on your side with your knees slightly bent. If you lie on your back, put a pillow under your knees.   Only take over-the-counter or prescription medicines as directed by your caregiver. Over-the-counter medicines to reduce pain and inflammation are often the most helpful.Your caregiver may prescribe muscle relaxant drugs.These medicines help dull your pain so you can more quickly return to your normal activities and healthy exercise.   Put ice on the injured area.   Put ice in a plastic bag.   Place a towel between your skin and the bag.   Leave the ice on for 15 to 20 minutes, 3 to 4 times a day for the first 2 to 3 days. After that, ice and heat may be alternated to reduce pain and spasms.   Ask your caregiver about trying back exercises and gentle massage. This may be of some benefit.   Avoid feeling anxious or stressed.Stress increases muscle tension and can worsen back pain.It is important to recognize when you are anxious or stressed and learn ways to manage it.Exercise is a great option.  SEEK MEDICAL CARE IF:  You have pain that is not relieved with rest or medicine.   You have   pain that does not improve in 1 week.   You have new symptoms.   You are generally not feeling well.  SEEK IMMEDIATE MEDICAL CARE IF:   You have pain that radiates from your back into your legs.   You develop new bowel or bladder control problems.   You have unusual weakness or numbness in your arms or legs.   You develop nausea or vomiting.   You develop abdominal pain.   You feel faint.  Document Released: 11/08/2005 Document Revised: 10/28/2011 Document Reviewed: 03/29/2011 [employer] Patient Information 2012 [employer]. 

## 2012-03-08 NOTE — Telephone Encounter (Signed)
Message copied by Hassan Buckler on Wed Mar 08, 2012  5:11 PM ------      Message from: Louretta Parma L      Created: Wed Mar 08, 2012  4:13 PM      Regarding: RE: Follow up of ED visit       Patient declined ov.  Says medicine is working will call back next week if not feeling better.            Thanks,            Irish Elders      ----- Message -----         From: Lars Mage, MD         Sent: 03/08/2012   1:33 PM           To: Imp Front Desk Pool, Imp Triage Nurse Pool      Subject: Follow up of ED visit                                    Please set up an appointment with a clinic doctor for follow up of ED visit for back pain. Please call the patient to let him know his appointment time and date.                  Thank you.

## 2012-03-08 NOTE — ED Provider Notes (Signed)
History     CSN: [id number]  Arrival date & time 03/08/12  0007   First MD Initiated Contact with Patient 03/08/12 [phone]      Chief Complaint  Patient presents with  . Back Pain    (Consider location/radiation/quality/duration/timing/severity/associated sxs/prior treatment) The history is provided by the patient.   patient reports he was working on a car at 4 days ago and was bent over the car for several hours and when he stood back up he was having severe pain in his low back.  He reports his low back pain has continued been constant since then.  It is moderate in severity.  It is worsened by movement and palpation.  It is improved by nothing.  He's tried over-the-counter medications without improvement.  He denies weakness or numbness in his lower committees.  His had no bowel or bladder incontinence or retention.  He has no peroneal numbness.  He denies IV drug abuse.  He's had no fevers or chills or weight loss.  Past Medical History  Diagnosis Date  . Hyperlipidemia   . Hypertension   . GERD (gastroesophageal reflux disease)   . Tobacco abuse   . COPD (chronic obstructive pulmonary disease)     bronchitis  . Disorder of vocal cord   . Cocaine dependence   . Right shoulder pain     2/2 partial rotator cuff tear, tendinopathy, mild subacromial/subdeltioid bursitis, A/C joint arthropathy per MRI done in 3/07    Past Surgical History  Procedure Date  . Neck surgery     Family History  Problem Relation Age of Onset  . Coronary artery disease      Male 1st degree relatives<60  . Diabetes      1 st degree relatives    History  Substance Use Topics  . Smoking status: Current Everyday Smoker -- 1.0 packs/day    Types: Cigarettes  . Smokeless tobacco: Not on file  . Alcohol Use: No      Review of Systems  Musculoskeletal: Positive for back pain.  All other systems reviewed and are negative.    Allergies  Review of patient's allergies indicates no known  allergies.  Home Medications   Current Outpatient Rx  Name Route Sig Dispense Refill  . AMLODIPINE BESYLATE 5 MG PO TABS Oral Take 5 mg by mouth daily.    Marland Kitchen BENAZEPRIL HCL 40 MG PO TABS Oral Take 40 mg by mouth daily.    Marland Kitchen FLUTICASONE-SALMETEROL 250-50 MCG/DOSE IN AEPB Inhalation Inhale 1 puff into the lungs every 12 (twelve) hours.      Marland Kitchen HYDROCHLOROTHIAZIDE 25 MG PO TABS Oral Take 25 mg by mouth daily.    . IBUPROFEN 200 MG PO TABS Oral Take 800 mg by mouth every 6 (six) hours as needed. For pain    . ADULT MULTIVITAMIN W/MINERALS CH Oral Take 1 tablet by mouth daily.    Marland Kitchen NICOTINE 14 MG/24HR TD PT24 Transdermal Place 1 patch onto the skin daily.    Marland Kitchen OMEPRAZOLE 20 MG PO CPDR Oral Take 20 mg by mouth daily.    . ALBUTEROL 90 MCG/ACT IN AERS Inhalation Inhale 2 puffs into the lungs every 6 (six) hours as needed for shortness of breath. 1 Act 11  . DIAZEPAM 5 MG PO TABS Oral Take 1 tablet (5 mg total) by mouth every 8 (eight) hours as needed (back spasm). 10 tablet 0  . OXYCODONE-ACETAMINOPHEN 5-325 MG PO TABS Oral Take 1 tablet by mouth every 4 (four)  hours as needed for pain. 15 tablet 0    BP 130/77  Pulse 75  Temp(Src) 97.7 F (36.5 C) (Oral)  Resp 16  Ht 5\' 11"  (1.803 m)  Wt 226 lb (102.513 kg)  BMI 31.52 kg/m2  SpO2 93%  Physical Exam  Nursing note and vitals reviewed. Constitutional: He is oriented to person, place, and time. He appears well-developed and well-nourished.  HENT:  Head: Normocephalic and atraumatic.  Eyes: EOM are normal.  Neck: Normal range of motion.  Cardiovascular: Normal rate, regular rhythm, normal heart sounds and intact distal pulses.   Pulmonary/Chest: Effort normal and breath sounds normal. No respiratory distress.  Abdominal: Soft. He exhibits no distension. There is no tenderness.  Musculoskeletal: Normal range of motion.       Mild paralumbar tenderness.  No spinal tenderness.  No rash noted.  Neurological: He is alert and oriented to  person, place, and time.       5 out of 5 strength in bilateral lower extremity major muscle groups.  Skin: Skin is warm and dry.  Psychiatric: He has a normal mood and affect. Judgment normal.    ED Course  Procedures (including critical care time)  Labs Reviewed - No data to display Dg Lumbar Spine Complete  03/08/2012  *RADIOLOGY REPORT*  Clinical Data: Lower back pain.  LUMBAR SPINE - COMPLETE 4+ VIEW  Comparison: CT of the abdomen and pelvis performed 07/15/2006  Findings: There is no evidence of fracture or subluxation. Vertebral bodies demonstrate normal height and alignment.  Mild anterior wedging at vertebral bodies T12 and L1 is likely developmental in nature.  Intervertebral disc spaces are preserved. Mild facet disease is noted along the lumbar spine.  The visualized bowel gas pattern is unremarkable in appearance; air and stool are noted within the colon.  The sacroiliac joints are within normal limits.  Scattered vascular calcifications are seen.  IMPRESSION: No evidence of fracture or subluxation along the lumbar spine.  Original Report Authenticated By: Tonia Ghent, M.D.   I personally reviewed the images  1. Back pain       MDM  Normal lower extremity neurologic exam. No bowel or bladder complaints. Xray for age greater than 50.  Likely musculoskeletal back pain. Doubt spinal epidural abscess. Doubt cauda equina. Doubt abdominal aortic aneurysm  6:05 AM The patient feels much better at this time.  DC home with close PCP followup         Lyanne Co, MD 03/08/12 [phone]

## 2012-03-08 NOTE — ED Notes (Signed)
Pt was working on a car this weekend and injured his lower back

## 2012-03-08 NOTE — ED Notes (Signed)
Pt. Discharged to home, pt. Alert and oriented, gait slow but steady, NAD noted

## 2012-03-15 ENCOUNTER — Encounter: Payer: Self-pay | Admitting: Internal Medicine

## 2012-03-16 ENCOUNTER — Encounter: Payer: Self-pay | Admitting: Internal Medicine

## 2012-03-16 ENCOUNTER — Ambulatory Visit (INDEPENDENT_AMBULATORY_CARE_PROVIDER_SITE_OTHER): Payer: Self-pay | Admitting: Internal Medicine

## 2012-03-16 VITALS — BP 112/72 | HR 82 | Temp 97.0°F | Ht 71.0 in | Wt 227.8 lb

## 2012-03-16 DIAGNOSIS — F17200 Nicotine dependence, unspecified, uncomplicated: Secondary | ICD-10-CM

## 2012-03-16 DIAGNOSIS — M545 Low back pain, unspecified: Secondary | ICD-10-CM

## 2012-03-16 DIAGNOSIS — E785 Hyperlipidemia, unspecified: Secondary | ICD-10-CM

## 2012-03-16 DIAGNOSIS — I10 Essential (primary) hypertension: Secondary | ICD-10-CM

## 2012-03-16 MED ORDER — HYDROCODONE-ACETAMINOPHEN 5-325 MG PO TABS: 1.0000 | ORAL_TABLET | Freq: Four times a day (QID) | ORAL | Status: AC | PRN

## 2012-03-16 MED ORDER — DIAZEPAM 5 MG PO TABS: 5.0000 mg | ORAL_TABLET | Freq: Every evening | ORAL | Status: DC | PRN

## 2012-03-16 MED ORDER — ALBUTEROL SULFATE HFA 108 (90 BASE) MCG/ACT IN AERS: 2.0000 | INHALATION_SPRAY | Freq: Four times a day (QID) | RESPIRATORY_TRACT | Status: DC | PRN

## 2012-03-16 NOTE — Patient Instructions (Signed)
1. Will give your two weeks supply of hydrocodone and valium 2. Call back to the clinic in two weeks about your back pain Will consider to refer you to Sports medicine if your pain is not better.

## 2012-03-16 NOTE — Progress Notes (Signed)
Patient ID: Bradley Sawyer, male   DOB: [birth year], 56 y.o.   MRN: [id number]  Subjective:   Patient ID: Bradley Sawyer male   DOB: 01-25-1956 56 y.o.   MRN: [id number]  HPI: [name] is a 56 y.o. man with PMH listed below who presents to the clinic for follow up visit from his ED visit on 03/08/12. Patient went to ED on 03/08/12 for back pain after working on his car for hours. A lumbar X ray showed no acute abnormality. He was given Valium and percocet for musculoskeletal back pain. He states that his back is better, but he still have some pain especially when he bends his back or lifts something heavy. He is here for evaluation.    Past Medical History  Diagnosis Date  . Hyperlipidemia   . Hypertension   . GERD (gastroesophageal reflux disease)   . Tobacco abuse   . COPD (chronic obstructive pulmonary disease)     bronchitis  . Disorder of vocal cord   . Cocaine dependence   . Right shoulder pain     2/2 partial rotator cuff tear, tendinopathy, mild subacromial/subdeltioid bursitis, A/C joint arthropathy per MRI done in 3/07   Current Outpatient Prescriptions  Medication Sig Dispense Refill  . amLODipine (NORVASC) 5 MG tablet Take 5 mg by mouth daily.      . benazepril (LOTENSIN) 40 MG tablet Take 40 mg by mouth daily.      . Fluticasone-Salmeterol (ADVAIR DISKUS) 250-50 MCG/DOSE AEPB Inhale 1 puff into the lungs every 12 (twelve) hours.        . hydrochlorothiazide (HYDRODIURIL) 25 MG tablet Take 25 mg by mouth daily.      Marland Kitchen ibuprofen (ADVIL,MOTRIN) 200 MG tablet Take 800 mg by mouth every 6 (six) hours as needed. For pain      . Multiple Vitamin (MULITIVITAMIN WITH MINERALS) TABS Take 1 tablet by mouth daily.      . nicotine (NICODERM CQ - DOSED IN MG/24 HOURS) 14 mg/24hr patch Place 1 patch onto the skin daily.      Marland Kitchen omeprazole (PRILOSEC) 20 MG capsule Take 20 mg by mouth daily.      Marland Kitchen oxyCODONE-acetaminophen (PERCOCET) 5-325 MG per tablet Take 1 tablet by mouth  every 4 (four) hours as needed for pain.  15 tablet  0  . albuterol (PROVENTIL HFA;VENTOLIN HFA) 108 (90 BASE) MCG/ACT inhaler Inhale 2 puffs into the lungs every 6 (six) hours as needed for wheezing.  1 Inhaler  2  . diazepam (VALIUM) 5 MG tablet Take 1 tablet (5 mg total) by mouth at bedtime as needed for anxiety or sleep.  30 tablet  0  . HYDROcodone-acetaminophen (NORCO) 5-325 MG per tablet Take 1 tablet by mouth every 6 (six) hours as needed for pain.  30 tablet  0   Family History  Problem Relation Age of Onset  . Coronary artery disease      Male 1st degree relatives<60  . Diabetes      1 st degree relatives   History   Social History  . Marital Status: Divorced    Spouse Name: N/A    Number of Children: N/A  . Years of Education: N/A   Social History Main Topics  . Smoking status: Current Everyday Smoker -- 1.0 packs/day    Types: Cigarettes  . Smokeless tobacco: None  . Alcohol Use: No  . Drug Use: No  . Sexually Active: None   Other Topics Concern  .  None   Social History Narrative  . None   Review of Systems: Review of Systems  No headache, fever, or sore throat. No shortness of breath or dyspnea on exertion. No chest pain, chest pressure or palpitation No nausea, vomiting, or abdominal pain. No melena, diarrhea or incontinence. No muscle weakness.                   Denies depression. No appetite or weight changes.   Objective:  Physical Exam: Filed Vitals:   03/16/12 1517  BP: 112/72  Pulse: 82  Temp: 97 F (36.1 C)  TempSrc: Oral  Height: 5\' 11"  (1.803 m)  Weight: 227 lb 12.8 oz (103.329 kg)   General: alert, well-developed, and cooperative to examination.  Head: normocephalic and atraumatic.  Eyes: vision grossly intact, pupils equal, pupils round, pupils reactive to light, no injection and anicteric.  Mouth: pharynx pink and moist, no erythema, and no exudates.  Neck: supple, full ROM, no thyromegaly, no JVD, and no carotid bruits.  Lungs:  normal respiratory effort, no accessory muscle use, normal breath sounds, no crackles, and no wheezes. Heart: normal rate, regular rhythm, no murmur, no gallop, and no rub.  Abdomen: soft, non-tender, normal bowel sounds, no distention, no guarding, no rebound tenderness, no hepatomegaly, and no splenomegaly.  Msk: no joint swelling, no joint warmth, and no redness over joints. Mild Paraspinal tenderness at her lumbar area. No spinal tenderness.  Pulses: 2+ DP/PT pulses bilaterally Extremities: No cyanosis, clubbing, edema Neurologic: alert & oriented X3, cranial nerves II-XII intact, strength normal in all extremities, sensation intact to light touch, and gait normal.  Skin: turgor normal and no rashes.  Psych: Oriented X3, memory intact for recent and remote, normally interactive, good eye contact, not anxious appearing, and not depressed appearing.   Assessment & Plan:

## 2012-03-17 ENCOUNTER — Encounter: Payer: Self-pay | Admitting: Internal Medicine

## 2012-03-17 ENCOUNTER — Other Ambulatory Visit: Payer: Self-pay | Admitting: Internal Medicine

## 2012-03-17 DIAGNOSIS — M545 Low back pain, unspecified: Secondary | ICD-10-CM | POA: Insufficient documentation

## 2012-03-17 DIAGNOSIS — E785 Hyperlipidemia, unspecified: Secondary | ICD-10-CM

## 2012-03-17 LAB — LDL CHOLESTEROL, DIRECT: Direct LDL: 109 mg/dL — ABNORMAL HIGH

## 2012-03-17 LAB — BASIC METABOLIC PANEL WITH GFR
BUN: 14 mg/dL (ref 6–23)
Calcium: 9.5 mg/dL (ref 8.4–10.5)
Creat: 0.98 mg/dL (ref 0.50–1.35)
GFR, Est African American: >89 mL/min
Glucose, Bld: 85 mg/dL (ref 70–99)

## 2012-03-17 LAB — LIPID PANEL

## 2012-03-17 NOTE — Progress Notes (Signed)
Addended by: Bufford Spikes on: 03/17/2012 03:32 PM   Modules accepted: Orders

## 2012-03-17 NOTE — Assessment & Plan Note (Signed)
He states that he has been compliant with his medications.  BP 112/72.   - will continue current therapy.

## 2012-03-17 NOTE — Assessment & Plan Note (Signed)
See HPI  - instructed patient to continue to take valium and percocet as needed for another 2-3 weeks. - if his back is not completely resolved, will consider referral to sports medicine.

## 2012-03-17 NOTE — Assessment & Plan Note (Signed)
Patient continues to smoke cig. Smoking cessation is done. Risks of smoking is discussed with patient and he understands.  Patient states that he does not want to quit smoking yet.

## 2012-03-17 NOTE — Assessment & Plan Note (Signed)
He has not had lipid panel checked for 2 years.  - check lipid panel.

## 2012-03-18 ENCOUNTER — Other Ambulatory Visit: Payer: Self-pay | Admitting: Internal Medicine

## 2012-03-18 DIAGNOSIS — E785 Hyperlipidemia, unspecified: Secondary | ICD-10-CM

## 2012-03-18 NOTE — Assessment & Plan Note (Signed)
Patient is asymptomatic. He is noted to have TG > 1000.   - will ask him to come back to clinic for FASTING lipid panel.

## 2012-03-20 ENCOUNTER — Telehealth: Payer: Self-pay | Admitting: Internal Medicine

## 2012-03-20 NOTE — Telephone Encounter (Signed)
I have called patient and discussed with him about his Lipid panel result. Patient is instructed to come back to the clinic for a fasting lipid panel this week. He understands the instruction.

## 2012-03-20 NOTE — Progress Notes (Signed)
Pt scheduled for fasting labs tomorrow.  Will make 2 week f/u at that time.

## 2012-03-21 ENCOUNTER — Other Ambulatory Visit (INDEPENDENT_AMBULATORY_CARE_PROVIDER_SITE_OTHER): Payer: Self-pay

## 2012-03-21 DIAGNOSIS — E785 Hyperlipidemia, unspecified: Secondary | ICD-10-CM

## 2012-03-21 LAB — LIPID PANEL
HDL: 33 mg/dL — ABNORMAL LOW (ref >39)
Triglycerides: 622 mg/dL — ABNORMAL HIGH (ref <150)

## 2012-03-27 ENCOUNTER — Other Ambulatory Visit: Payer: Self-pay | Admitting: Internal Medicine

## 2012-03-27 MED ORDER — GEMFIBROZIL 600 MG PO TABS: 600.0000 mg | ORAL_TABLET | Freq: Two times a day (BID) | ORAL | Status: DC

## 2012-03-28 NOTE — Progress Notes (Signed)
Pt was called;stated the pharmacy had called him yesterday about the medication (Lopid).

## 2012-04-04 ENCOUNTER — Encounter: Payer: Self-pay | Admitting: Internal Medicine

## 2012-04-28 ENCOUNTER — Other Ambulatory Visit: Payer: Self-pay | Admitting: Internal Medicine

## 2012-04-28 ENCOUNTER — Encounter: Payer: Self-pay | Admitting: Internal Medicine

## 2012-04-28 ENCOUNTER — Ambulatory Visit (INDEPENDENT_AMBULATORY_CARE_PROVIDER_SITE_OTHER): Payer: PRIVATE HEALTH INSURANCE | Admitting: Internal Medicine

## 2012-04-28 VITALS — BP 107/77 | HR 78 | Temp 96.9°F | Ht 71.0 in | Wt 231.3 lb

## 2012-04-28 DIAGNOSIS — M545 Low back pain, unspecified: Secondary | ICD-10-CM

## 2012-04-28 DIAGNOSIS — F17200 Nicotine dependence, unspecified, uncomplicated: Secondary | ICD-10-CM

## 2012-04-28 DIAGNOSIS — Z1211 Encounter for screening for malignant neoplasm of colon: Secondary | ICD-10-CM

## 2012-04-28 DIAGNOSIS — I10 Essential (primary) hypertension: Secondary | ICD-10-CM

## 2012-04-28 MED ORDER — BUPROPION HCL ER (SR) 150 MG PO TB12: ORAL_TABLET | ORAL | Status: DC

## 2012-04-28 MED ORDER — MELOXICAM 7.5 MG PO TABS: 7.5000 mg | ORAL_TABLET | Freq: Every day | ORAL | Status: DC

## 2012-04-28 MED ORDER — CYCLOBENZAPRINE HCL 5 MG PO TABS: 5.0000 mg | ORAL_TABLET | Freq: Three times a day (TID) | ORAL | Status: DC | PRN

## 2012-04-28 MED ORDER — VARENICLINE TARTRATE 1 MG PO TABS: ORAL_TABLET | ORAL | Status: DC

## 2012-04-28 NOTE — Assessment & Plan Note (Addendum)
Chantix today. Patient is motivated to quit. Side effects including suicidal ideation and depression was explained. Follow up in 2-3 weeks.

## 2012-04-28 NOTE — Progress Notes (Signed)
  Subjective:    Patient ID: Bradley Sawyer, male    DOB: 1956-02-29, 56 y.o.   MRN: [id number]  HPI Patient is here today for a follow up appointment for his BP.  He is very motivated to quit smoking. He is using nicotine patches at this time. He smokes about 1 ppd with nicotine patches on and about 2 ppd when he does not have nicotine patch on. He wants me to prescribe him chantix at this time. He has a new insurance and hope that chantix will be covered by this plan.  His BP is elevated today. He is taking all his medications as prescribed. He is having back pain and out for his valium.  Back pain is worse today. He lifted a machine on Wednesday, 2 days ago which made the pain worse. Pain is located at lower back, no radiation, 5/10 in intensity, gradually becoming worse, no weakness of legs noted, no change in bowel habits or urinary habits. No fevers, chills noted. Patient has been having back pain for while and was valium and percocet by the ER. He is out of those medications now.  No other complaints.    Review of Systems  Constitutional: Negative for fever, activity change and appetite change.  HENT: Negative for sore throat.   Respiratory: Negative for cough and shortness of breath.   Cardiovascular: Negative for chest pain and leg swelling.  Gastrointestinal: Negative for nausea, abdominal pain, diarrhea, constipation and abdominal distention.  Genitourinary: Negative for frequency, hematuria and difficulty urinating.  Musculoskeletal: Positive for back pain.  Neurological: Negative for dizziness and headaches.  Psychiatric/Behavioral: Negative for suicidal ideas and behavioral problems.       Objective:   Physical Exam  Constitutional: He is oriented to person, place, and time. He appears well-developed and well-nourished.  HENT:  Head: Normocephalic and atraumatic.  Eyes: Conjunctivae and EOM are normal. Pupils are equal, round, and reactive to light. No scleral  icterus.  Neck: Normal range of motion. Neck supple. No JVD present. No thyromegaly present.  Cardiovascular: Normal rate, regular rhythm, normal heart sounds and intact distal pulses.  Exam reveals no gallop and no friction rub.   No murmur heard. Pulmonary/Chest: Effort normal and breath sounds normal. No respiratory distress. He has no wheezes. He has no rales.  Abdominal: Soft. Bowel sounds are normal. He exhibits no distension and no mass. There is no tenderness. There is no rebound and no guarding.  Musculoskeletal: Normal range of motion. He exhibits no edema and no tenderness.  Lymphadenopathy:    He has no cervical adenopathy.  Neurological: He is alert and oriented to person, place, and time.       SLRT is negative on both sides. No tenderness or pain noted on spinal examination. There is some muscle stiffness around L4-L5 region.  Psychiatric: He has a normal mood and affect. His behavior is normal.          Assessment & Plan:

## 2012-04-28 NOTE — Patient Instructions (Signed)
Back Pain, Adult Low back pain is very common. About 1 in 5 people have back pain.The cause of low back pain is rarely dangerous. The pain often gets better over time.About half of people with a sudden onset of back pain feel better in just 2 weeks. About 8 in 10 people feel better by 6 weeks.  CAUSES Some common causes of back pain include:  Strain of the muscles or ligaments supporting the spine.   Wear and tear (degeneration) of the spinal discs.   Arthritis.   Direct injury to the back.  DIAGNOSIS Most of the time, the direct cause of low back pain is not known.However, back pain can be treated effectively even when the exact cause of the pain is unknown.Answering your caregiver's questions about your overall health and symptoms is one of the most accurate ways to make sure the cause of your pain is not dangerous. If your caregiver needs more information, he or she may order lab work or imaging tests (X-rays or MRIs).However, even if imaging tests show changes in your back, this usually does not require surgery. HOME CARE INSTRUCTIONS For many people, back pain returns.Since low back pain is rarely dangerous, it is often a condition that people can learn to manageon their own.   Remain active. It is stressful on the back to sit or stand in one place. Do not sit, drive, or stand in one place for more than 30 minutes at a time. Take short walks on level surfaces as soon as pain allows.Try to increase the length of time you walk each day.   Do not stay in bed.Resting more than 1 or 2 days can delay your recovery.   Do not avoid exercise or work.Your body is made to move.It is not dangerous to be active, even though your back may hurt.Your back will likely heal faster if you return to being active before your pain is gone.   Pay attention to your body when you bend and lift. Many people have less discomfortwhen lifting if they bend their knees, keep the load close to their  bodies,and avoid twisting. Often, the most comfortable positions are those that put less stress on your recovering back.   Find a comfortable position to sleep. Use a firm mattress and lie on your side with your knees slightly bent. If you lie on your back, put a pillow under your knees.   Only take over-the-counter or prescription medicines as directed by your caregiver. Over-the-counter medicines to reduce pain and inflammation are often the most helpful.Your caregiver may prescribe muscle relaxant drugs.These medicines help dull your pain so you can more quickly return to your normal activities and healthy exercise.   Put ice on the injured area.   Put ice in a plastic bag.   Place a towel between your skin and the bag.   Leave the ice on for 15 to 20 minutes, 3 to 4 times a day for the first 2 to 3 days. After that, ice and heat may be alternated to reduce pain and spasms.   Ask your caregiver about trying back exercises and gentle massage. This may be of some benefit.   Avoid feeling anxious or stressed.Stress increases muscle tension and can worsen back pain.It is important to recognize when you are anxious or stressed and learn ways to manage it.Exercise is a great option.  SEEK MEDICAL CARE IF:  You have pain that is not relieved with rest or medicine.   You have   pain that does not improve in 1 week.   You have new symptoms.   You are generally not feeling well.  SEEK IMMEDIATE MEDICAL CARE IF:   You have pain that radiates from your back into your legs.   You develop new bowel or bladder control problems.   You have unusual weakness or numbness in your arms or legs.   You develop nausea or vomiting.   You develop abdominal pain.   You feel faint.  Document Released: 11/08/2005 Document Revised: 10/28/2011 Document Reviewed: 03/29/2011 [employer] Patient Information 2012 [employer]. 

## 2012-04-28 NOTE — Assessment & Plan Note (Signed)
No new labs today. Elevated but acute back pain is contributing. No change in medications today. Follow up in 2-3 weeks. If still elevated will increase norvasc to 10. Smoking cessation and weight loss will help.

## 2012-04-28 NOTE — Assessment & Plan Note (Signed)
Mobic and cyclobenzaprine for 2-3 weeks. Heat pads 2-3 times  Aday. No danger signs at this time. No imaging necessary at this time. Smoking cessation will help.

## 2012-05-12 ENCOUNTER — Encounter: Payer: Self-pay | Admitting: Internal Medicine

## 2012-05-18 ENCOUNTER — Other Ambulatory Visit: Payer: Self-pay | Admitting: Internal Medicine

## 2012-05-18 ENCOUNTER — Other Ambulatory Visit (INDEPENDENT_AMBULATORY_CARE_PROVIDER_SITE_OTHER): Payer: PRIVATE HEALTH INSURANCE

## 2012-05-18 ENCOUNTER — Other Ambulatory Visit: Payer: Self-pay | Admitting: *Deleted

## 2012-05-18 DIAGNOSIS — R195 Other fecal abnormalities: Secondary | ICD-10-CM

## 2012-05-18 DIAGNOSIS — Z86010 Personal history of colonic polyps: Secondary | ICD-10-CM | POA: Insufficient documentation

## 2012-05-18 DIAGNOSIS — Z1211 Encounter for screening for malignant neoplasm of colon: Secondary | ICD-10-CM

## 2012-05-18 LAB — POC HEMOCCULT BLD/STL (HOME/3-CARD/SCREEN): Card #2 Fecal Occult Blod, POC: NEGATIVE

## 2012-05-18 MED ORDER — GEMFIBROZIL 600 MG PO TABS: 600.0000 mg | ORAL_TABLET | Freq: Two times a day (BID) | ORAL | Status: DC

## 2012-05-18 NOTE — Assessment & Plan Note (Signed)
I would refer him to gastroenterology for an outpatient diagnostic colonoscopy. I informed the patient about the results of his Hemoccult test.  The possible causes of Hemoccult positive stool include colon cancer, hemorrhoids, diverticulosis or infectious etiology. Followup after colonoscopy results.

## 2012-05-31 ENCOUNTER — Telehealth: Payer: Self-pay | Admitting: *Deleted

## 2012-05-31 NOTE — Telephone Encounter (Signed)
SPOKE WITH [name] THIS MORNING, MADE APPT WIT [facility] FOR AUGUST 1.013 FOR COLONOSCOPY. PATIENT IS TO CALL OFFICE TO SEE IF THEY WILL SET UP PAYMENT PLAN. [facility] SAYS THAT THE INSURANCE PLAN THAT THE PATIENT HAS ONLY WILL PAY FOR 100.00 OF THE BILL. APPT. MAILED TO THE PATIENT. LELA STURDIVANT NT 7/10/013  @ 11:24PM

## 2012-06-08 ENCOUNTER — Telehealth: Payer: Self-pay | Admitting: *Deleted

## 2012-06-08 NOTE — Telephone Encounter (Signed)
Phoned patient as he missed his previsit appointment 06-08-12 at 2:30 pm. He states that he is unable to afford his appointment or procedure with his insurance and that he told "the lady at the hospital" this. I spoke with Jerl Santos about preapproval for insurance. The diagnosis code sent with the ambulatory referral is 792.10 and the [place] code [place] 45[address]. Both of these numbers were given to the patient to call for preapproval or see what coverage there may be. I explained that I would cancel both appointments and should he desire to reschedule them to call our office to do so. I encouraged him to follow up due to his age and current recommendations for colon cancer screening and also for the fact that his referral states blood in stool and the need to further explore this. He states understanding.

## 2012-06-22 ENCOUNTER — Encounter: Payer: PRIVATE HEALTH INSURANCE | Admitting: Gastroenterology

## 2012-09-24 ENCOUNTER — Emergency Department (HOSPITAL_COMMUNITY): Payer: PRIVATE HEALTH INSURANCE

## 2012-09-24 ENCOUNTER — Encounter (HOSPITAL_COMMUNITY): Payer: Self-pay | Admitting: Emergency Medicine

## 2012-09-24 ENCOUNTER — Emergency Department (HOSPITAL_COMMUNITY)
Admission: EM | Admit: 2012-09-24 | Discharge: 2012-09-25 | Disposition: A | Discharge status: Home / Self Care | Payer: PRIVATE HEALTH INSURANCE | Attending: Emergency Medicine | Admitting: Emergency Medicine

## 2012-09-24 DIAGNOSIS — F17200 Nicotine dependence, unspecified, uncomplicated: Secondary | ICD-10-CM | POA: Insufficient documentation

## 2012-09-24 DIAGNOSIS — J4489 Other specified chronic obstructive pulmonary disease: Secondary | ICD-10-CM | POA: Insufficient documentation

## 2012-09-24 DIAGNOSIS — R079 Chest pain, unspecified: Secondary | ICD-10-CM

## 2012-09-24 DIAGNOSIS — R0789 Other chest pain: Secondary | ICD-10-CM | POA: Insufficient documentation

## 2012-09-24 DIAGNOSIS — Z79899 Other long term (current) drug therapy: Secondary | ICD-10-CM | POA: Insufficient documentation

## 2012-09-24 DIAGNOSIS — I10 Essential (primary) hypertension: Secondary | ICD-10-CM | POA: Insufficient documentation

## 2012-09-24 DIAGNOSIS — Z8719 Personal history of other diseases of the digestive system: Secondary | ICD-10-CM | POA: Insufficient documentation

## 2012-09-24 DIAGNOSIS — R209 Unspecified disturbances of skin sensation: Secondary | ICD-10-CM | POA: Insufficient documentation

## 2012-09-24 DIAGNOSIS — E785 Hyperlipidemia, unspecified: Secondary | ICD-10-CM | POA: Insufficient documentation

## 2012-09-24 DIAGNOSIS — J449 Chronic obstructive pulmonary disease, unspecified: Secondary | ICD-10-CM | POA: Insufficient documentation

## 2012-09-24 LAB — BASIC METABOLIC PANEL
CO2: 25 mEq/L (ref 19–32)
Calcium: 10 mg/dL (ref 8.4–10.5)
Chloride: 97 mEq/L (ref 96–112)
Creatinine, Ser: 0.88 mg/dL (ref 0.50–1.35)
Glucose, Bld: 137 mg/dL — ABNORMAL HIGH (ref 70–99)

## 2012-09-24 LAB — POCT I-STAT TROPONIN I

## 2012-09-24 LAB — CBC
Hemoglobin: 14.9 g/dL (ref 13.0–17.0)
MCH: 31.2 pg (ref 26.0–34.0)
MCV: 89.3 fL (ref 78.0–100.0)
RBC: 4.77 MIL/uL (ref 4.22–5.81)
WBC: 11.9 10*3/uL — ABNORMAL HIGH (ref 4.0–10.5)

## 2012-09-24 MED ORDER — ASPIRIN 81 MG PO CHEW
324.0000 mg | CHEWABLE_TABLET | Freq: Once | ORAL | Status: AC
  Administered 2012-09-24: 324 mg via ORAL
  Filled 2012-09-24: qty 4

## 2012-09-24 NOTE — ED Notes (Signed)
Pt states that he started noticing numbness in his left hand that started on Thursday that comes and goes.  Pt states that around the same time he noticed a "chest tightness" that comes and goes over left chest.  Pt states that it feels "like someone is pinching me from the inside".

## 2012-09-24 NOTE — ED Notes (Signed)
Patient transported to X-ray 

## 2012-09-24 NOTE — ED Provider Notes (Signed)
History     CSN: [id number]  Arrival date & time 09/24/12  1900   First MD Initiated Contact with Patient 09/24/12 2205      Chief Complaint  Patient presents with  . Numbness  . Chest Pain    (Consider location/radiation/quality/duration/timing/severity/associated sxs/prior treatment) HPI History provided by pt.   Pt presents w/ c/o CP and UE paresthesias x 3d.  Describes CP as intermittent, non-exertional, non-positional, non-pleuritic, brief pinching sensations in left anterolateral chest w/out associated SOB, diaphoresis, nausea, cough, abd pain.  COPD at baseline.  Denies trauma.  Has also had intermittent, mild pain in left side of neck that does not occur at same time as chest pains.  Has tingling sensation of left fingers which has been constant and started simultaneously to CP.  No UE pain or mid-line cervical pain.  PMH sig for tobacco use and HTN.  Strong FH MI.   No RF for PE.    Past Medical History  Diagnosis Date  . Hyperlipidemia   . Hypertension   . GERD (gastroesophageal reflux disease)   . Tobacco abuse   . COPD (chronic obstructive pulmonary disease)     bronchitis  . Disorder of vocal cord   . Cocaine dependence   . Right shoulder pain     2/2 partial rotator cuff tear, tendinopathy, mild subacromial/subdeltioid bursitis, A/C joint arthropathy per MRI done in 3/07    Past Surgical History  Procedure Date  . Neck surgery     Family History  Problem Relation Age of Onset  . Coronary artery disease      Male 1st degree relatives<60  . Diabetes      1 st degree relatives    History  Substance Use Topics  . Smoking status: Current Every Day Smoker -- 1.0 packs/day    Types: Cigarettes  . Smokeless tobacco: Not on file     Comment: Wants to try Chantix32.26  . Alcohol Use: No      Review of Systems  All other systems reviewed and are negative.    Allergies  Review of patient's allergies indicates no known allergies.  Home Medications     Current Outpatient Rx  Name  Route  Sig  Dispense  Refill  . ALBUTEROL SULFATE HFA 108 (90 BASE) MCG/ACT IN AERS   Inhalation   Inhale 2 puffs into the lungs every 6 (six) hours as needed for wheezing.   1 Inhaler   2   . AMLODIPINE BESYLATE 5 MG PO TABS   Oral   Take 5 mg by mouth daily.         Marland Kitchen BENAZEPRIL HCL 40 MG PO TABS   Oral   Take 40 mg by mouth daily.         . CYCLOBENZAPRINE HCL 5 MG PO TABS   Oral   Take 1 tablet (5 mg total) by mouth every 8 (eight) hours as needed for muscle spasms.   30 tablet   1   . HYDROCHLOROTHIAZIDE 25 MG PO TABS   Oral   Take 25 mg by mouth daily.         . ADULT MULTIVITAMIN W/MINERALS CH   Oral   Take 1 tablet by mouth daily.         Marland Kitchen OMEPRAZOLE 20 MG PO CPDR   Oral   Take 20 mg by mouth daily.         Marland Kitchen FLUTICASONE-SALMETEROL 250-50 MCG/DOSE IN AEPB   Inhalation   Inhale  1 puff into the lungs every 12 (twelve) hours.           Marland Kitchen GEMFIBROZIL 600 MG PO TABS   Oral   Take 600 mg by mouth 2 (two) times daily.           BP 138/90  Pulse 80  Temp 97.9 F (36.6 C) (Oral)  Resp 16  Ht 5\' 11"  (1.803 m)  SpO2 94%  Physical Exam  Nursing note and vitals reviewed. Constitutional: He is oriented to person, place, and time. He appears well-developed and well-nourished. No distress.  HENT:  Head: Normocephalic and atraumatic.  Eyes:       Normal appearance  Neck: Normal range of motion.  Cardiovascular: Normal rate, regular rhythm and intact distal pulses.   Pulmonary/Chest: Effort normal and breath sounds normal. No respiratory distress. He exhibits no tenderness.       No pleuritic pain reported  Abdominal: Soft. Bowel sounds are normal. He exhibits no distension. There is no tenderness. There is no guarding.  Musculoskeletal: Normal range of motion.       No peripheral edema or calf tenderness  Neurological: He is alert and oriented to person, place, and time.  Skin: Skin is warm and dry. No rash  noted.  Psychiatric: He has a normal mood and affect. His behavior is normal.    ED Course  Procedures (including critical care time)   Date: 09/25/2012  Rate: 81  Rhythm: normal sinus rhythm  QRS Axis: normal  Intervals: normal  ST/T Wave abnormalities: poor R wave progression  Conduction Disutrbances:none  Narrative Interpretation:   Old EKG Reviewed: none available   Labs Reviewed  CBC - Abnormal; Notable for the following:    WBC 11.9 (*)     All other components within normal limits  BASIC METABOLIC PANEL - Abnormal; Notable for the following:    Sodium 134 (*)     Potassium 3.2 (*)     Glucose, Bld 137 (*)     All other components within normal limits  POCT I-STAT TROPONIN I  POCT I-STAT TROPONIN I   Dg Chest 2 View  09/24/2012  *RADIOLOGY REPORT*  Clinical Data: Chest pain, shortness of breath, cough and chest numbness.  History of smoking.  CHEST - 2 VIEW  Comparison: Chest radiograph performed 06/24/2009  Findings: The lungs are well-aerated.  Chronic peribronchial thickening is noted.  There is no evidence of focal opacification, pleural effusion or pneumothorax.  The heart is normal in size; the mediastinal contour is within normal limits.  No acute osseous abnormalities are seen.  IMPRESSION: No acute cardiopulmonary process seen.  Chronic peribronchial thickening noted.   Original Report Authenticated By: Tonia Ghent, M.D.      1. Chest pain       MDM  [id number] M presents w/ atypical CP.  TIMI 1 (HTN, FH, tobacco).  Received aspirin.  EKG non-ischemic and 2 neg troponins.  I recommended CP protocol and discussed risks of patient going home this evening, but he prefers to follow up on an outpatient basis.  Consulted Dr. Tresa Endo, on call for [facility], and he will contact office so that patient may be seen tomorrow and stress echo or Coronary CT performed.  Strict return precautions discussed.         Otilio Miu, [place] 09/25/12 [phone]

## 2012-09-27 NOTE — ED Provider Notes (Signed)
Medical screening examination/treatment/procedure(s) were performed by non-physician practitioner and as supervising physician I was immediately available for consultation/collaboration.  Shanece Cochrane R. Samvel Zinn, MD 09/27/12 0703 

## 2012-09-29 ENCOUNTER — Observation Stay (HOSPITAL_COMMUNITY): Payer: PRIVATE HEALTH INSURANCE

## 2012-09-29 ENCOUNTER — Observation Stay (HOSPITAL_COMMUNITY)
Admission: EM | Admit: 2012-09-29 | Discharge: 2012-09-29 | Disposition: A | Discharge status: Home / Self Care | Payer: PRIVATE HEALTH INSURANCE | Attending: Emergency Medicine | Admitting: Emergency Medicine

## 2012-09-29 ENCOUNTER — Encounter (HOSPITAL_COMMUNITY): Payer: Self-pay | Admitting: Emergency Medicine

## 2012-09-29 ENCOUNTER — Encounter (HOSPITAL_COMMUNITY): Payer: Self-pay

## 2012-09-29 ENCOUNTER — Emergency Department (HOSPITAL_COMMUNITY)
Admission: EM | Admit: 2012-09-29 | Discharge: 2012-09-29 | Disposition: A | Discharge status: Home / Self Care | Payer: PRIVATE HEALTH INSURANCE | Attending: Emergency Medicine | Admitting: Emergency Medicine

## 2012-09-29 ENCOUNTER — Emergency Department (HOSPITAL_COMMUNITY): Payer: PRIVATE HEALTH INSURANCE

## 2012-09-29 DIAGNOSIS — E7800 Pure hypercholesterolemia, unspecified: Secondary | ICD-10-CM | POA: Insufficient documentation

## 2012-09-29 DIAGNOSIS — K219 Gastro-esophageal reflux disease without esophagitis: Secondary | ICD-10-CM | POA: Insufficient documentation

## 2012-09-29 DIAGNOSIS — F17200 Nicotine dependence, unspecified, uncomplicated: Secondary | ICD-10-CM | POA: Insufficient documentation

## 2012-09-29 DIAGNOSIS — J449 Chronic obstructive pulmonary disease, unspecified: Secondary | ICD-10-CM | POA: Insufficient documentation

## 2012-09-29 DIAGNOSIS — Z8249 Family history of ischemic heart disease and other diseases of the circulatory system: Secondary | ICD-10-CM | POA: Insufficient documentation

## 2012-09-29 DIAGNOSIS — R079 Chest pain, unspecified: Secondary | ICD-10-CM

## 2012-09-29 DIAGNOSIS — I10 Essential (primary) hypertension: Secondary | ICD-10-CM | POA: Insufficient documentation

## 2012-09-29 DIAGNOSIS — J4489 Other specified chronic obstructive pulmonary disease: Secondary | ICD-10-CM | POA: Insufficient documentation

## 2012-09-29 LAB — CBC
HCT: 42.6 % (ref 39.0–52.0)
MCV: 90.1 fL (ref 78.0–100.0)
Platelets: 217 10*3/uL (ref 150–400)
RBC: 4.73 MIL/uL (ref 4.22–5.81)
RDW: 12.3 % (ref 11.5–15.5)
WBC: 10.9 10*3/uL — ABNORMAL HIGH (ref 4.0–10.5)

## 2012-09-29 LAB — POCT I-STAT TROPONIN I
Troponin i, poc: 0.01 ng/mL (ref 0.00–0.08)
Troponin i, poc: 0.01 ng/mL (ref 0.00–0.08)

## 2012-09-29 LAB — BASIC METABOLIC PANEL
CO2: 28 mEq/L (ref 19–32)
Chloride: 98 mEq/L (ref 96–112)
Creatinine, Ser: 0.87 mg/dL (ref 0.50–1.35)
GFR calc Af Amer: >90 mL/min (ref >90)
Potassium: 3.5 mEq/L (ref 3.5–5.1)

## 2012-09-29 MED ORDER — ASPIRIN 325 MG PO TABS
325.0000 mg | ORAL_TABLET | ORAL | Status: AC
  Administered 2012-09-29: 325 mg via ORAL
  Filled 2012-09-29: qty 4

## 2012-09-29 MED ORDER — IOHEXOL 350 MG/ML SOLN
80.0000 mL | Freq: Once | INTRAVENOUS | Status: AC | PRN
  Administered 2012-09-29: 80 mL via INTRAVENOUS

## 2012-09-29 MED ORDER — METOPROLOL TARTRATE 25 MG PO TABS
50.0000 mg | ORAL_TABLET | Freq: Once | ORAL | Status: AC
  Administered 2012-09-29: 50 mg via ORAL
  Filled 2012-09-29: qty 2

## 2012-09-29 MED ORDER — METOPROLOL TARTRATE 1 MG/ML IV SOLN
INTRAVENOUS | Status: AC
  Filled 2012-09-29: qty 10

## 2012-09-29 MED ORDER — ASPIRIN 81 MG PO CHEW: 325.0000 mg | CHEWABLE_TABLET | Freq: Every day | ORAL | Status: DC

## 2012-09-29 MED ORDER — METOPROLOL TARTRATE 1 MG/ML IV SOLN
INTRAVENOUS | Status: AC
  Administered 2012-09-29: 5 mg
  Filled 2012-09-29: qty 5

## 2012-09-29 MED ORDER — ASPIRIN 81 MG PO CHEW: 324.0000 mg | CHEWABLE_TABLET | Freq: Once | ORAL | Status: DC

## 2012-09-29 MED ORDER — NITROGLYCERIN 0.4 MG SL SUBL
SUBLINGUAL_TABLET | SUBLINGUAL | Status: AC
  Administered 2012-09-29: 12:00:00
  Filled 2012-09-29: qty 25

## 2012-09-29 NOTE — ED Notes (Signed)
Phlebotomy called to collect I-stat troponin

## 2012-09-29 NOTE — ED Notes (Signed)
[facility] transporting pt. 

## 2012-09-29 NOTE — ED Notes (Addendum)
Patient here to have troponin and ED EKG rechecked for Cardiology.  Charge RN Barbara Cower) and PA Dahlia Client) aware of situation.  Patient being moved to CDU, per charge RN.  Report given to Verlon Au, RN by Lowella Bandy, RN.  ED EKG and I-stat troponin ordered, per protocol.

## 2012-09-29 NOTE — ED Notes (Signed)
Report given to [facility] in CDU. Will call when pt is being transported [phone]

## 2012-09-29 NOTE — ED Notes (Signed)
Pt sent here from [facility] ED for further evaluation of CP, to place pt in CDU for CP protocol, pt a/o x4, no complaints at this time

## 2012-09-29 NOTE — ED Notes (Signed)
Report given to [facility]. En route

## 2012-09-29 NOTE — ED Provider Notes (Signed)
Pt placed in the CDU on chest pain protocol from [facility] this morning.  He was discharged after his CT coronary.  On chart review Laurey Arrow realized that the patient had not had a delta troponin drawn and cardiology was not consulted for CT coronary results before the patient was discharged.  The patient was contact and asked to return to the ED for further eval.    CT coronary done this morning stated: Extensive nonobstructive coronary artery disease, as detailed  above, with areas of stenosis up to 25-50% in the LAD (closer to 25% than 50%). Patient's total coronary artery calcium score is 419 which is 94th percentile for patient's of matched age, gender  and race/ethnicity.   I consulted with cardiology on call, Dr. Patty Sermons with [facility] cardiology.  We reviewed his CT coronary, ECG and lab work. He recommends a repeat troponin and ECG. If both are nonischemic and the patient may be discharged home with followup in his office on Monday morning.  8:50 PM   ECG:  Date: 09/29/2012  Rate: 68  Rhythm: normal sinus rhythm  QRS Axis: normal  Intervals: normal  ST/T Wave abnormalities: normal  Conduction Disutrbances:none  Narrative Interpretation: nonischemic ECG  Old EKG Reviewed: unchanged  Delta troponin negative.  Will discharge home with [facility] F/U.    Dahlia Client Tait Balistreri, PA-C 09/29/12 2131

## 2012-09-29 NOTE — ED Provider Notes (Signed)
History     CSN: [id number]  Arrival date & time 09/29/12  4098   First MD Initiated Contact with Patient 09/29/12 [phone]      Chief Complaint  Patient presents with  . Chest Pain    (Consider location/radiation/quality/duration/timing/severity/associated sxs/prior treatment) HPI Comments: Patient with risk factors (HTN, high cholesterol, smoking, early FH of CAD) -- presents with continued recurrences of chest pain and numbness in L hand and forearm for > 1 week. Symptoms are unrelated to activity or food and are not associated with N/V, palpitations, diaphoresis. Pain is left chest, described as someone 'pinching the inside of my chest' that will last as long as 3-5 minutes. Denies abd pain, fever. No treatments for symptoms. No LE swelling. Patient was seen several days ago and requested outpatient work-up. States he has outpatient stress scheduled 11/20 but is worried and does not want to wait for this appointment. No pain or symptoms at present time. The onset of this condition was acute. The course is intermittent. Aggravating factors: none. Alleviating factors: none.    The history is provided by the patient.    Past Medical History  Diagnosis Date  . Hyperlipidemia   . Hypertension   . GERD (gastroesophageal reflux disease)   . Tobacco abuse   . COPD (chronic obstructive pulmonary disease)     bronchitis  . Disorder of vocal cord   . Cocaine dependence   . Right shoulder pain     2/2 partial rotator cuff tear, tendinopathy, mild subacromial/subdeltioid bursitis, A/C joint arthropathy per MRI done in 3/07    Past Surgical History  Procedure Date  . Neck surgery     Family History  Problem Relation Age of Onset  . Coronary artery disease      Male 1st degree relatives<60  . Diabetes      1 st degree relatives    History  Substance Use Topics  . Smoking status: Former Smoker -- 1.0 packs/day    Types: Cigarettes  . Smokeless tobacco: Not on file     Comment:  Wants to try Chantix32.26  . Alcohol Use: No      Review of Systems  Constitutional: Negative for fever and diaphoresis.  HENT: Negative for sore throat, rhinorrhea and neck pain.   Eyes: Negative for redness.  Respiratory: Negative for cough and shortness of breath.   Cardiovascular: Positive for chest pain. Negative for palpitations and leg swelling.  Gastrointestinal: Negative for nausea, vomiting, abdominal pain and diarrhea.  Genitourinary: Negative for dysuria.  Musculoskeletal: Negative for myalgias and back pain.  Skin: Negative for rash.  Neurological: Positive for numbness. Negative for syncope, light-headedness and headaches.    Allergies  Review of patient's allergies indicates no known allergies.  Home Medications   Current Outpatient Rx  Name  Route  Sig  Dispense  Refill  . ALBUTEROL SULFATE HFA 108 (90 BASE) MCG/ACT IN AERS   Inhalation   Inhale 2 puffs into the lungs every 6 (six) hours as needed for wheezing.   1 Inhaler   2   . AMLODIPINE BESYLATE 5 MG PO TABS   Oral   Take 5 mg by mouth daily.         Marland Kitchen BENAZEPRIL HCL 40 MG PO TABS   Oral   Take 40 mg by mouth daily.         . CYCLOBENZAPRINE HCL 5 MG PO TABS   Oral   Take 1 tablet (5 mg total) by mouth every 8 (  eight) hours as needed for muscle spasms.   30 tablet   1   . FLUTICASONE-SALMETEROL 250-50 MCG/DOSE IN AEPB   Inhalation   Inhale 1 puff into the lungs every 12 (twelve) hours.           Marland Kitchen HYDROCHLOROTHIAZIDE 25 MG PO TABS   Oral   Take 25 mg by mouth daily.         . ADULT MULTIVITAMIN W/MINERALS CH   Oral   Take 1 tablet by mouth daily.         Marland Kitchen OMEPRAZOLE 20 MG PO CPDR   Oral   Take 20 mg by mouth daily.           BP 130/95  Pulse 85  Temp 98 F (36.7 C)  Resp 17  SpO2 96%  Physical Exam  Nursing note and vitals reviewed. Constitutional: He appears well-developed and well-nourished.  HENT:  Head: Normocephalic and atraumatic.  Mouth/Throat: Mucous  membranes are normal. Mucous membranes are not dry.  Eyes: Conjunctivae normal are normal.  Neck: Trachea normal and normal range of motion. Neck supple. Normal carotid pulses and no JVD present. No muscular tenderness present. Carotid bruit is not present. No tracheal deviation present.       R anterior surgical scar on neck.   Cardiovascular: Normal rate, regular rhythm, S1 normal, S2 normal, normal heart sounds and intact distal pulses.  Exam reveals no distant heart sounds and no decreased pulses.   No murmur heard. Pulmonary/Chest: Effort normal and breath sounds normal. No respiratory distress. He has no wheezes. He exhibits no tenderness.  Abdominal: Soft. Normal aorta and bowel sounds are normal. There is no tenderness. There is no rebound and no guarding.  Musculoskeletal: He exhibits no edema.  Neurological: He is alert.  Skin: Skin is warm and dry. He is not diaphoretic. No cyanosis. No pallor.  Psychiatric: He has a normal mood and affect.    ED Course  Procedures (including critical care time)  Labs Reviewed  CBC - Abnormal; Notable for the following:    WBC 10.9 (*)     All other components within normal limits  BASIC METABOLIC PANEL - Abnormal; Notable for the following:    Glucose, Bld 171 (*)     All other components within normal limits  POCT I-STAT TROPONIN I   Dg Chest Port 1 View  09/29/2012  *RADIOLOGY REPORT*  Clinical Data: Chest pain  PORTABLE CHEST - 1 VIEW  Comparison: 09/24/2012  Findings: The heart and pulmonary vascularity are within normal limits.  The lungs are clear bilaterally.  No acute bony abnormality is seen.  IMPRESSION: No acute intrathoracic abnormality.   Original Report Authenticated By: Alcide Clever, M.D.      1. Chest pain     7:23 AM Patient seen and examined. Previous records reviewed. Work-up initiated. Medications ordered (ASA). EKG reviewed.   Vital signs reviewed and are as follows: Filed Vitals:   09/29/12 0709  BP: 130/95    Pulse: 85  Temp: 98 F (36.7 C)  Resp: 17    Date: 09/29/2012  Rate:78  Rhythm: normal sinus rhythm  QRS Axis: left  Intervals: normal  ST/T Wave abnormalities: normal  Conduction Disutrbances:none  Narrative Interpretation:   Old EKG Reviewed: changes noted from 09/24/12 -- new left axis deviation  8:38 AM Patient was discussed with Dr. Silverio Lay earlier. Will move to [facility] and have coronary CT done. BMI=32. I spoke with [facility] and Dr. Manus Gunning who are  aware of patient and plan.   Patient informed of results.   9:16 AM Dr. Silverio Lay has seen. Awaiting [facility].    MDM  Chest pain with multiple risk factors -- transfer to [facility] for coronary CT.         [place], [place] 09/29/12 [phone]

## 2012-09-29 NOTE — ED Notes (Signed)
Pt alert and oriented x4. Respirations even and unlabored, bilateral symmetrical rise and fall of chest. Skin warm and dry. In no acute distress. Denies needs.   

## 2012-09-29 NOTE — Progress Notes (Signed)
Utilization review completed.  

## 2012-09-29 NOTE — ED Notes (Signed)
Pt transported to CT for CT heart

## 2012-09-29 NOTE — ED Provider Notes (Signed)
Medical screening examination/treatment/procedure(s) were conducted as a shared visit with non-physician practitioner(s) and myself.  I personally evaluated the patient during the encounter  Bradley Sawyer is a 56 y.o. male hx of HTN, HL, smoker here with chest pain. Chest pain a week ago, was seen and recommended CDU with stress test. Patient didn't want to stay at that time and was set up for outpatient stress test. Had more chest pain today that resolved. EKG unchanged, Trop neg x 1. He is transferred to [facility] CDU for coronary CT.    Richardean Canal, MD 09/29/12 [phone]

## 2012-09-29 NOTE — ED Notes (Signed)
Pt presenting to ed with c/o left side chest pain onset x over 1 week pt states positive numbness and tingling radiating into his left arm pt denies nausea and vomiting. Pt states positive shortness of breath but "I have COPD"

## 2012-09-29 NOTE — ED Provider Notes (Signed)
9:47 AM Patient with a hx sig for chest pain was placed in CDU on chest pain protocol by Rhea Bleacher, PA-C from [facility] ED. Patient care resumed from [facility], [place].  Patient is here for chest pain and has received metoprolol. Patient re-evaluated and is resting comfortable, VSS, with no new complaints or concerns at this time. Plan per previous provider is for patient to have CT angiogram and disposition will determined by the results. On exam: hemodynamically stable, NAD, heart w/ RRR, lungs CTAB, Chest & abd non-tender, no peripheral edema or calf tenderness.   2:00 PM Patient's CT angio shows significant CAD with a calcium score of 419, putting the patient in the 94%ile for his age. Patient will be discharged with close Cardiology and PCP follow up. Also, patient has a right lower lobe lung nodule that should be followed in 1 year with a CT scan if patient is a smoker. No further evaluation needed at this time.   Emilia Beck, PA-C 09/29/12 1401

## 2012-09-30 NOTE — ED Provider Notes (Signed)
Medical screening examination/treatment/procedure(s) were performed by non-physician practitioner and as supervising physician I was immediately available for consultation/collaboration.  Doug Sou, MD 09/30/12 [phone]

## 2012-10-06 NOTE — ED Provider Notes (Signed)
Medical screening examination/treatment/procedure(s) were performed by non-physician practitioner and as supervising physician I was immediately available for consultation/collaboration.  Brodric Schauer R. Jakyrie Totherow, MD 10/06/12 1054 

## 2012-10-12 ENCOUNTER — Encounter: Payer: Self-pay | Admitting: Cardiovascular Disease

## 2012-10-12 ENCOUNTER — Ambulatory Visit (INDEPENDENT_AMBULATORY_CARE_PROVIDER_SITE_OTHER): Payer: Self-pay | Admitting: Cardiovascular Disease

## 2012-10-12 VITALS — BP 144/84 | HR 70 | Ht 71.0 in | Wt 233.0 lb

## 2012-10-12 DIAGNOSIS — J449 Chronic obstructive pulmonary disease, unspecified: Secondary | ICD-10-CM

## 2012-10-12 DIAGNOSIS — E785 Hyperlipidemia, unspecified: Secondary | ICD-10-CM

## 2012-10-12 DIAGNOSIS — J4489 Other specified chronic obstructive pulmonary disease: Secondary | ICD-10-CM

## 2012-10-12 DIAGNOSIS — I10 Essential (primary) hypertension: Secondary | ICD-10-CM

## 2012-10-12 DIAGNOSIS — F17200 Nicotine dependence, unspecified, uncomplicated: Secondary | ICD-10-CM

## 2012-10-12 MED ORDER — NITROGLYCERIN 0.4 MG SL SUBL: 0.4000 mg | SUBLINGUAL_TABLET | SUBLINGUAL | Status: DC | PRN

## 2012-10-12 MED ORDER — ATORVASTATIN CALCIUM 20 MG PO TABS: 20.0000 mg | ORAL_TABLET | Freq: Every day | ORAL | Status: DC

## 2012-10-12 NOTE — Assessment & Plan Note (Signed)
No active wheezing CXR ok in ER  F/U Dr Aundria Rud

## 2012-10-12 NOTE — Assessment & Plan Note (Signed)
Given high calcium score resume statin Lipitor 20 mg.  ASA  Would benefit form yearly ETT or other funcitonal study

## 2012-10-12 NOTE — Patient Instructions (Addendum)
Basic carbohydrate counting is a way to plan meals. It is done by counting the amount of carbohydrate in foods. Foods that have carbohydrates are starches (grains, beans, starchy vegetables) and sweets. Eating carbohydrates increases blood glucose (sugar) levels. People with diabetes use carbohydrate counting to help keep their blood glucose at a normal level.   COUNTING CARBOHYDRATES IN FOODS The first step in counting carbohydrates is to learn how many carbohydrate servings you should have in every meal. A dietitian can plan this for you. After learning the amount of carbohydrates to include in your meal plan, you can start to choose the carbohydrate-containing foods you want to eat.  There are 2 ways to identify the amount of carbohydrates in the foods you eat.  Read the Nutrition Facts panel on food labels. You need 2 pieces of information from the Nutrition Facts panel to count carbohydrates this way:  Serving size.  Total carbohydrate (in grams). Decide how many servings you will be eating. If it is 1 serving, you will be eating the amount of carbohydrate listed on the panel. If you will be eating 2 servings, you will be eating double the amount of carbohydrate listed on the panel.   Learn serving sizes. A serving size of most carbohydrate-containing foods is about 15 grams (g). Listed below are single serving sizes of common carbohydrate-containing foods:  1 slice bread.   cup unsweetened, dry cereal.   cup hot cereal.   cup rice.   cup mashed potatoes.   cup pasta.  1 cup fresh fruit.   cup canned fruit.  1 cup milk (whole, 2%, or skim).   cup starchy vegetables (peas, corn, or potatoes). Counting carbohydrates this way is similar to looking on the Nutrition Facts panel. Decide how many servings you will eat first. Multiply the number of servings you eat by 15 g. For example, if you have 2 cups of strawberries, you had 2 servings. That means you had 30 g of  carbohydrate (2 servings x 15 g = 30 g).  CALCULATING CARBOHYDRATES IN A MEAL Sample dinner  3 oz chicken breast.   cup brown rice.   cup corn.  1 cup fat-free milk.  1 cup strawberries with sugar-free whipped topping. Carbohydrate calculation First, identify the foods that contain carbohydrate:  Rice.  Corn.  Milk.  Strawberries. Calculate the number of servings eaten:  2 servings rice.  1 serving corn.  1 serving milk.  1 serving strawberries. Multiply the number of servings by 15 g:  2 servings rice x 15 g = 30 g.  1 serving corn x 15 g = 15 g.  1 serving milk x 15 g = 15 g.  1 serving strawberries x 15 g = 15 g. Add the amounts to find the total carbohydrates eaten: 30 g + 15 g + 15 g + 15 g = 75 g carbohydrate eaten at dinner. Document Released: 11/08/2005 Document Revised: 01/31/2012 Document Reviewed: 09/24/2011 [facility] Patient Information 2013 [place], [place].   Start taking Atorvastatin daily at bedtime  Return to the office in 3 months for fasting cholesterol labs  Your physician wants you to follow-up in: 3 months with Dr. Haywood Filler will receive a reminder letter in the mail two months in advance. If you don't receive a letter, please call our office to schedule the follow-up appointment.   Nitroglycerin sublingual tablets What is this medicine? NITROGLYCERIN (nye troe GLI ser in) is a type of vasodilator. It relaxes blood vessels, increasing the  blood and oxygen supply to your heart. This medicine is used to relieve chest pain caused by angina. It is also used to prevent chest pain before activities like climbing stairs, going outdoors in cold weather, or sexual activity.  How should I use this medicine? Take this medicine by mouth as needed. At the first sign of an angina attack (chest pain or tightness) place one tablet under your tongue. You can also take this medicine 5 to 10 minutes before an event likely to produce chest pain. Follow  the directions on the prescription label. Let the tablet dissolve under the tongue. Do not swallow whole. Replace the dose if you accidentally swallow it. It will help if your mouth is not dry. Saliva around the tablet will help it to dissolve more quickly. Do not eat or drink, smoke or chew tobacco while a tablet is dissolving. If you are not better within 5 minutes after taking ONE dose of nitroglycerin, call 9-1-1 immediately to seek emergency medical care. Do not take more than 3 nitroglycerin tablets over 15 minutes.    dietary supplements you use. Also tell them if you smoke, drink alcohol, or use illegal drugs. Some items may interact with your medicine. What should I watch for while using this medicine? Tell your doctor or health care professional if you feel your medicine is no longer working. Keep this medicine with you at all times. Sit or lie down when you take your medicine to prevent falling if you feel dizzy or faint after using it. Try to remain calm. This will help you to feel better faster. If you feel dizzy, take several deep breaths and lie down with your feet propped up, or bend forward with your head resting between your knees. You may get drowsy or dizzy. Do not drive, use machinery, or do anything that needs mental alertness until you know how this drug affects you. Do not stand or sit up quickly, especially if you are an older patient. This reduces the risk of dizzy or fainting spells. Alcohol can make you more drowsy and dizzy. Avoid alcoholic drinks. Do not treat yourself for coughs, colds, or pain while you are taking this medicine without asking your doctor or health care professional for advice. Some ingredients may increase your blood pressure. What side effects may I notice from receiving this medicine? Side effects that you should report to your doctor or health care professional as soon as possible: -blurred vision -dry mouth -skin rash -sweating -

## 2012-10-12 NOTE — Progress Notes (Signed)
Patient ID: Bradley Sawyer, male   DOB: 07-25-1956, 56 y.o.   MRN: [id number] 56 yo seen in ER 11/8  Reviewed records.  SSCP.  R/O normal ECG Negative troponin  Cardiac CT with no obstructive disease but calcium scoreover 400 with 25-50% mid LAD disease  SSCP atypical sharp fleeting and radiation to finger tips.  LDL is 107 was on statin before but script ran out.  Still smoking has nicotine patch on .  Has quit for 12 weeks last year.  No dyspnea, pleurisy, palpitations or syncope  Has primary care F/U at [facility]  ROS: Denies fever, malais, weight loss, blurry vision, decreased visual acuity, cough, sputum, SOB, hemoptysis, pleuritic pain, palpitaitons, heartburn, abdominal pain, melena, lower extremity edema, claudication, or rash.  All other systems reviewed and negative   General: Affect appropriate Healthy:  appears stated age HEENT: normal Neck supple with no adenopathy JVP normal no bruits no thyromegaly Lungs clear with no wheezing and good diaphragmatic motion Heart:  S1/S2 no murmur,rub, gallop or click PMI normal Abdomen: benighn, BS positve, no tenderness, no AAA no bruit.  No HSM or HJR Distal pulses intact with no bruits No edema Neuro non-focal Skin warm and dry No muscular weakness  Medications Current Outpatient Prescriptions  Medication Sig Dispense Refill  . albuterol (PROVENTIL HFA;VENTOLIN HFA) 108 (90 BASE) MCG/ACT inhaler Inhale 2 puffs into the lungs every 6 (six) hours as needed for wheezing.  1 Inhaler  2  . amLODipine (NORVASC) 5 MG tablet Take 5 mg by mouth daily.      Marland Kitchen aspirin 81 MG chewable tablet 2 TABS PO QD      . benazepril (LOTENSIN) 40 MG tablet Take 40 mg by mouth daily.      . cyclobenzaprine (FLEXERIL) 5 MG tablet Take 1 tablet (5 mg total) by mouth every 8 (eight) hours as needed for muscle spasms.  30 tablet  1  . fish oil-omega-3 fatty acids 1000 MG capsule Take 2 g by mouth daily.      . Fluticasone-Salmeterol (ADVAIR DISKUS) 250-50 MCG/DOSE  AEPB Inhale 1 puff into the lungs every 12 (twelve) hours.        . hydrochlorothiazide (HYDRODIURIL) 25 MG tablet Take 25 mg by mouth daily.      . meloxicam (MOBIC) 7.5 MG tablet Take 7.5 mg by mouth daily.      . Multiple Vitamin (MULITIVITAMIN WITH MINERALS) TABS Take 1 tablet by mouth daily.      Marland Kitchen omeprazole (PRILOSEC) 20 MG capsule Take 20 mg by mouth daily.      Marland Kitchen atorvastatin (LIPITOR) 20 MG tablet Take 1 tablet (20 mg total) by mouth at bedtime.  90 tablet  3  . nitroGLYCERIN (NITROSTAT) 0.4 MG SL tablet Place 1 tablet (0.4 mg total) under the tongue every 5 (five) minutes as needed for chest pain.  25 tablet  8    Allergies Review of patient's allergies indicates no known allergies.  Family History: Family History  Problem Relation Age of Onset  . Coronary artery disease      Male 1st degree relatives<60  . Diabetes      1 st degree relatives    Social History: History   Social History  . Marital Status: Divorced    Spouse Name: N/A    Number of Children: N/A  . Years of Education: N/A   Occupational History  . Not on file.   Social History Main Topics  . Smoking status: Former Smoker --  1.0 packs/day    Types: Cigarettes  . Smokeless tobacco: Not on file     Comment: Wants to try Chantix32.26  . Alcohol Use: No  . Drug Use: No  . Sexually Active: Not on file   Other Topics Concern  . Not on file   Social History Narrative  . No narrative on file    Electrocardiogram:  11/8  NSR normal ECG  Assessment and Plan

## 2012-10-12 NOTE — Assessment & Plan Note (Signed)
Counseled for less than 10 minutes.  Will try e-cig and still has patches.  Relationship of smoking to his COPD and coronary artery calcium discussed at length

## 2012-10-12 NOTE — Assessment & Plan Note (Signed)
Well controlled.  Continue current medications and low sodium Dash type diet.    

## 2012-11-22 DIAGNOSIS — I639 Cerebral infarction, unspecified: Secondary | ICD-10-CM

## 2012-11-22 HISTORY — DX: Cerebral infarction, unspecified: I63.9

## 2012-12-08 ENCOUNTER — Other Ambulatory Visit: Payer: Self-pay | Admitting: Internal Medicine

## 2012-12-08 NOTE — Telephone Encounter (Signed)
Last seen June 2013. Has Feb appt with Cards. Ask pt to make appt. April or MAy please with Dr Eben Burow

## 2012-12-08 NOTE — Telephone Encounter (Signed)
Flag sent to front desk pool for appt as directed. 

## 2013-01-10 ENCOUNTER — Telehealth: Payer: Self-pay | Admitting: *Deleted

## 2013-01-10 NOTE — Telephone Encounter (Signed)
Pt called stating he has taken chantix in past and quit smoking for 18 weeks. He then had some problems with his Son and restarted smoking, he is asking for another Rx for chantix so he can quit smoking again. Please advise

## 2013-01-11 ENCOUNTER — Other Ambulatory Visit: Payer: Self-pay | Admitting: Internal Medicine

## 2013-01-11 MED ORDER — VARENICLINE TARTRATE 1 MG PO TABS: ORAL_TABLET | ORAL | Status: DC

## 2013-01-11 MED ORDER — VARENICLINE TARTRATE 0.5 MG PO TABS: ORAL_TABLET | ORAL | Status: DC

## 2013-01-11 NOTE — Telephone Encounter (Signed)
Please see the routing note on prescriptions.  Thank you Kerston Landeck

## 2013-01-12 ENCOUNTER — Ambulatory Visit: Payer: PRIVATE HEALTH INSURANCE | Admitting: Cardiovascular Disease

## 2013-03-09 ENCOUNTER — Encounter: Payer: Self-pay | Admitting: Cardiovascular Disease

## 2013-03-12 ENCOUNTER — Ambulatory Visit (INDEPENDENT_AMBULATORY_CARE_PROVIDER_SITE_OTHER): Payer: Self-pay | Admitting: Internal Medicine

## 2013-03-12 ENCOUNTER — Encounter: Payer: Self-pay | Admitting: Internal Medicine

## 2013-03-12 VITALS — BP 137/78 | HR 67 | Temp 97.7°F | Wt 235.3 lb

## 2013-03-12 DIAGNOSIS — M545 Low back pain, unspecified: Secondary | ICD-10-CM

## 2013-03-12 DIAGNOSIS — R7309 Other abnormal glucose: Secondary | ICD-10-CM

## 2013-03-12 DIAGNOSIS — R739 Hyperglycemia, unspecified: Secondary | ICD-10-CM

## 2013-03-12 DIAGNOSIS — F17200 Nicotine dependence, unspecified, uncomplicated: Secondary | ICD-10-CM

## 2013-03-12 DIAGNOSIS — M533 Sacrococcygeal disorders, not elsewhere classified: Secondary | ICD-10-CM

## 2013-03-12 LAB — COMPLETE METABOLIC PANEL WITH GFR
ALT: 37 U/L (ref 0–53)
CO2: 27 mEq/L (ref 19–32)
Calcium: 9.7 mg/dL (ref 8.4–10.5)
Chloride: 102 mEq/L (ref 96–112)
GFR, Est African American: >89 mL/min
GFR, Est Non African American: 83 mL/min
Glucose, Bld: 93 mg/dL (ref 70–99)
Sodium: 138 mEq/L (ref 135–145)
Total Bilirubin: 0.2 mg/dL — ABNORMAL LOW (ref 0.3–1.2)
Total Protein: 7.5 g/dL (ref 6.0–8.3)

## 2013-03-12 LAB — TSH: TSH: 1.095 u[IU]/mL (ref 0.350–4.500)

## 2013-03-12 MED ORDER — CYCLOBENZAPRINE HCL 5 MG PO TABS: 5.0000 mg | ORAL_TABLET | Freq: Three times a day (TID) | ORAL | Status: DC | PRN

## 2013-03-12 MED ORDER — MELOXICAM 7.5 MG PO TABS: 7.5000 mg | ORAL_TABLET | Freq: Every day | ORAL | Status: DC

## 2013-03-12 NOTE — Progress Notes (Signed)
  Subjective:    Patient ID: Bradley Sawyer, male    DOB: 1956/10/26, 57 y.o.   MRN: [id number]  HPI Patient is doing well.  He gets back spasms every 2-3 months which last for about 3-4 days and is requesting a refill of meloxicam 7.5 mg tab.   BP is high today.  Patient does not complain of any shortness of breath although he continues to smoke and we discussed the importance of smoking cessation.   Review of Systems  Constitutional: Negative for fever, activity change and appetite change.  HENT: Negative for sore throat.   Respiratory: Negative for cough and shortness of breath.   Cardiovascular: Negative for chest pain and leg swelling.  Gastrointestinal: Negative for nausea, abdominal pain, diarrhea, constipation and abdominal distention.  Genitourinary: Negative for frequency, hematuria and difficulty urinating.  Neurological: Negative for dizziness and headaches.  Psychiatric/Behavioral: Negative for suicidal ideas and behavioral problems.       Objective:   Physical Exam  Constitutional: He is oriented to person, place, and time. He appears well-developed and well-nourished.  HENT:  Head: Normocephalic and atraumatic.  Eyes: Conjunctivae and EOM are normal. Pupils are equal, round, and reactive to light. No scleral icterus.  Neck: Normal range of motion. Neck supple. No JVD present. No thyromegaly present.  Cardiovascular: Normal rate, regular rhythm, normal heart sounds and intact distal pulses.  Exam reveals no gallop and no friction rub.   No murmur heard. Pulmonary/Chest: Effort normal and breath sounds normal. No respiratory distress. He has no wheezes. He has no rales.  Abdominal: Soft. Bowel sounds are normal. He exhibits no distension and no mass. There is no tenderness. There is no rebound and no guarding.  Musculoskeletal: Normal range of motion. He exhibits no edema and no tenderness.  Lymphadenopathy:    He has no cervical adenopathy.  Neurological: He is  alert and oriented to person, place, and time.  Psychiatric: He has a normal mood and affect. His behavior is normal.          Assessment & Plan:

## 2013-03-12 NOTE — Patient Instructions (Signed)
Smoking Cessation Quitting smoking is important to your health and has many advantages. However, it is not always easy to quit since nicotine is a very addictive drug. Often times, people try 3 times or more before being able to quit. This document explains the best ways for you to prepare to quit smoking. Quitting takes hard work and a lot of effort, but you can do it. ADVANTAGES OF QUITTING SMOKING  You will live longer, feel better, and live better.  Your body will feel the impact of quitting smoking almost immediately.  Within 20 minutes, blood pressure decreases. Your pulse returns to its normal level.  After 8 hours, carbon monoxide levels in the blood return to normal. Your oxygen level increases.  After 24 hours, the chance of having a heart attack starts to decrease. Your breath, hair, and body stop smelling like smoke.  After 48 hours, damaged nerve endings begin to recover. Your sense of taste and smell improve.  After 72 hours, the body is virtually free of nicotine. Your bronchial tubes relax and breathing becomes easier.  After 2 to 12 weeks, lungs can hold more air. Exercise becomes easier and circulation improves.  The risk of having a heart attack, stroke, cancer, or lung disease is greatly reduced.  After 1 year, the risk of coronary heart disease is cut in half.  After 5 years, the risk of stroke falls to the same as a nonsmoker.  After 10 years, the risk of lung cancer is cut in half and the risk of other cancers decreases significantly.  After 15 years, the risk of coronary heart disease drops, usually to the level of a nonsmoker.  If you are pregnant, quitting smoking will improve your chances of having a healthy baby.  The people you live with, especially any children, will be healthier.  You will have extra money to spend on things other than cigarettes. QUESTIONS TO THINK ABOUT BEFORE ATTEMPTING TO QUIT You may want to talk about your answers with your  caregiver.  Why do you want to quit?  If you tried to quit in the past, what helped and what did not?  What will be the most difficult situations for you after you quit? How will you plan to handle them?  Who can help you through the tough times? Your family? Friends? A caregiver?  What pleasures do you get from smoking? What ways can you still get pleasure if you quit? Here are some questions to ask your caregiver:  How can you help me to be successful at quitting?  What medicine do you think would be best for me and how should I take it?  What should I do if I need more help?  What is smoking withdrawal like? How can I get information on withdrawal? GET READY  Set a quit date.  Change your environment by getting rid of all cigarettes, ashtrays, matches, and lighters in your home, car, or work. Do not let people smoke in your home.  Review your past attempts to quit. Think about what worked and what did not. GET SUPPORT AND ENCOURAGEMENT You have a better chance of being successful if you have help. You can get support in many ways.  Tell your family, friends, and co-workers that you are going to quit and need their support. Ask them not to smoke around you.  Get individual, group, or telephone counseling and support. Programs are available at local hospitals and health centers. Call your local health department for   information about programs in your area.  Spiritual beliefs and practices may help some smokers quit.  Download a "quit meter" on your computer to keep track of quit statistics, such as how long you have gone without smoking, cigarettes not smoked, and money saved.  Get a self-help book about quitting smoking and staying off of tobacco. LEARN NEW SKILLS AND BEHAVIORS  Distract yourself from urges to smoke. Talk to someone, go for a walk, or occupy your time with a task.  Change your normal routine. Take a different route to work. Drink tea instead of coffee.  Eat breakfast in a different place.  Reduce your stress. Take a hot bath, exercise, or read a book.  Plan something enjoyable to do every day. Reward yourself for not smoking.  Explore interactive web-based programs that specialize in helping you quit. GET MEDICINE AND USE IT CORRECTLY Medicines can help you stop smoking and decrease the urge to smoke. Combining medicine with the above behavioral methods and support can greatly increase your chances of successfully quitting smoking.  Nicotine replacement therapy helps deliver nicotine to your body without the negative effects and risks of smoking. Nicotine replacement therapy includes nicotine gum, lozenges, inhalers, nasal sprays, and skin patches. Some may be available over-the-counter and others require a prescription.  Antidepressant medicine helps people abstain from smoking, but how this works is unknown. This medicine is available by prescription.  Nicotinic receptor partial agonist medicine simulates the effect of nicotine in your brain. This medicine is available by prescription. Ask your caregiver for advice about which medicines to use and how to use them based on your health history. Your caregiver will tell you what side effects to look out for if you choose to be on a medicine or therapy. Carefully read the information on the package. Do not use any other product containing nicotine while using a nicotine replacement product.  RELAPSE OR DIFFICULT SITUATIONS Most relapses occur within the first 3 months after quitting. Do not be discouraged if you start smoking again. Remember, most people try several times before finally quitting. You may have symptoms of withdrawal because your body is used to nicotine. You may crave cigarettes, be irritable, feel very hungry, cough often, get headaches, or have difficulty concentrating. The withdrawal symptoms are only temporary. They are strongest when you first quit, but they will go away within  10 14 days. To reduce the chances of relapse, try to:  Avoid drinking alcohol. Drinking lowers your chances of successfully quitting.  Reduce the amount of caffeine you consume. Once you quit smoking, the amount of caffeine in your body increases and can give you symptoms, such as a rapid heartbeat, sweating, and anxiety.  Avoid smokers because they can make you want to smoke.  Do not let weight gain distract you. Many smokers will gain weight when they quit, usually less than 10 pounds. Eat a healthy diet and stay active. You can always lose the weight gained after you quit.  Find ways to improve your mood other than smoking. FOR MORE INFORMATION  www.smokefree.gov  Document Released: 11/02/2001 Document Revised: 05/09/2012 Document Reviewed: 02/17/2012 [employer] Patient Information 2013 [employer].  

## 2013-03-15 ENCOUNTER — Telehealth: Payer: Self-pay | Admitting: *Deleted

## 2013-03-15 NOTE — Telephone Encounter (Signed)
Patient was informed about his Hba1c results. He was told to lose weight and eat healthy and set up an appointment for repeat check up after 3 months.  Patient also complained of back pain which is interfering in his work and not relieved with current medications. Patient will double the dose of his pain medication meloxicam and flexeril for next 3-4 days and if pain is not relieved by Monday, he should call our office back.   Yvetta Drotar

## 2013-03-15 NOTE — Telephone Encounter (Signed)
Pt called with concerns about his lab results.  He was expecting a call yesterday. Please call at [phone] with lab results.  Concerns DM

## 2013-03-16 NOTE — Assessment & Plan Note (Addendum)
Patient complains of back pain which has again come up.  Conservative management for now. Patient is >50 Has had pain >1 month Not relieved with pain medications  May consider MRI without contrast to evaluate causes of back pain if this continues. Meloxicam and flexeril for now.

## 2013-03-16 NOTE — Assessment & Plan Note (Signed)
We discussed the importance of smoking cessation. He has tried chantix in the past but doe snot have money to buy it again. He says that he will try and quit on his own.

## 2013-06-13 ENCOUNTER — Other Ambulatory Visit: Payer: Self-pay | Admitting: Internal Medicine

## 2013-06-13 ENCOUNTER — Other Ambulatory Visit: Payer: Self-pay | Admitting: *Deleted

## 2013-06-13 MED ORDER — AMLODIPINE BESYLATE 5 MG PO TABS: 5.0000 mg | ORAL_TABLET | Freq: Every day | ORAL | Status: DC

## 2013-06-25 ENCOUNTER — Emergency Department: Payer: Self-pay | Admitting: Emergency Medicine

## 2013-06-27 ENCOUNTER — Emergency Department: Payer: Self-pay

## 2013-06-27 LAB — URINALYSIS, COMPLETE
Bilirubin,UR: NEGATIVE
Glucose,UR: NEGATIVE mg/dL (ref 0–75)
Ketone: NEGATIVE
Ph: 6 (ref 4.5–8.0)
Squamous Epithelial: 3
WBC UR: 1 /HPF (ref 0–5)

## 2013-06-27 LAB — CBC
HCT: 42.2 % (ref 40.0–52.0)
HGB: 14.9 g/dL (ref 13.0–18.0)
MCH: 31.9 pg (ref 26.0–34.0)
MCHC: 35.2 g/dL (ref 32.0–36.0)
MCV: 91 fL (ref 80–100)
Platelet: 163 10*3/uL (ref 150–440)
RBC: 4.66 10*6/uL (ref 4.40–5.90)
WBC: 10.9 10*3/uL — ABNORMAL HIGH (ref 3.8–10.6)

## 2013-06-27 LAB — COMPREHENSIVE METABOLIC PANEL
Albumin: 3.6 g/dL (ref 3.4–5.0)
Alkaline Phosphatase: 215 U/L — ABNORMAL HIGH (ref 50–136)
Anion Gap: 3 — ABNORMAL LOW (ref 7–16)
BUN: 13 mg/dL (ref 7–18)
Bilirubin,Total: 1.3 mg/dL — ABNORMAL HIGH (ref 0.2–1.0)
Calcium, Total: 9.1 mg/dL (ref 8.5–10.1)
Co2: 32 mmol/L (ref 21–32)
EGFR (African American): >60
EGFR (Non-African Amer.): >60
Glucose: 111 mg/dL — ABNORMAL HIGH (ref 65–99)
Osmolality: 273 (ref 275–301)
Potassium: 3.5 mmol/L (ref 3.5–5.1)
SGPT (ALT): 217 U/L — ABNORMAL HIGH (ref 12–78)
Sodium: 136 mmol/L (ref 136–145)
Total Protein: 8.2 g/dL (ref 6.4–8.2)

## 2013-06-27 LAB — LIPASE, BLOOD: Lipase: 348 U/L (ref 73–393)

## 2013-07-01 ENCOUNTER — Emergency Department: Payer: Self-pay | Admitting: Emergency Medicine

## 2013-07-01 LAB — CBC
HCT: 42.6 % (ref 40.0–52.0)
HGB: 15.1 g/dL (ref 13.0–18.0)
MCH: 31.8 pg (ref 26.0–34.0)
MCV: 90 fL (ref 80–100)
Platelet: 209 10*3/uL (ref 150–440)
RBC: 4.73 10*6/uL (ref 4.40–5.90)
RDW: 12.7 % (ref 11.5–14.5)

## 2013-07-01 LAB — BASIC METABOLIC PANEL
Anion Gap: 5 — ABNORMAL LOW (ref 7–16)
BUN: 17 mg/dL (ref 7–18)
Calcium, Total: 9.5 mg/dL (ref 8.5–10.1)
Co2: 31 mmol/L (ref 21–32)
Creatinine: 0.83 mg/dL (ref 0.60–1.30)
EGFR (Non-African Amer.): >60
Glucose: 106 mg/dL — ABNORMAL HIGH (ref 65–99)
Osmolality: 274 (ref 275–301)
Sodium: 136 mmol/L (ref 136–145)

## 2013-07-16 ENCOUNTER — Telehealth: Payer: Self-pay | Admitting: *Deleted

## 2013-07-16 NOTE — Telephone Encounter (Signed)
Pt called past few days sharp pains in his "tubes". Denies any redness.  Usually goes to ER. Appt given 07/17/13 2:15PM Dr Zada Girt. [facility] Thom Ollinger RN 07/16/13 2:50PM

## 2013-07-17 ENCOUNTER — Ambulatory Visit (INDEPENDENT_AMBULATORY_CARE_PROVIDER_SITE_OTHER): Payer: PRIVATE HEALTH INSURANCE | Admitting: Internal Medicine

## 2013-07-17 ENCOUNTER — Encounter: Payer: Self-pay | Admitting: Internal Medicine

## 2013-07-17 VITALS — BP 141/78 | HR 69 | Temp 97.0°F | Ht 71.0 in | Wt 218.5 lb

## 2013-07-17 DIAGNOSIS — M545 Low back pain, unspecified: Secondary | ICD-10-CM

## 2013-07-17 DIAGNOSIS — J449 Chronic obstructive pulmonary disease, unspecified: Secondary | ICD-10-CM

## 2013-07-17 DIAGNOSIS — R1031 Right lower quadrant pain: Secondary | ICD-10-CM

## 2013-07-17 DIAGNOSIS — M533 Sacrococcygeal disorders, not elsewhere classified: Secondary | ICD-10-CM

## 2013-07-17 LAB — URINALYSIS, ROUTINE W REFLEX MICROSCOPIC
Bilirubin Urine: NEGATIVE
Ketones, ur: NEGATIVE mg/dL
Nitrite: NEGATIVE
Protein, ur: NEGATIVE mg/dL
Specific Gravity, Urine: 1.014 (ref 1.005–1.030)
Urobilinogen, UA: 0.2 mg/dL (ref 0.0–1.0)

## 2013-07-17 MED ORDER — ALBUTEROL SULFATE HFA 108 (90 BASE) MCG/ACT IN AERS: 2.0000 | INHALATION_SPRAY | Freq: Four times a day (QID) | RESPIRATORY_TRACT | Status: DC | PRN

## 2013-07-17 MED ORDER — MELOXICAM 7.5 MG PO TABS: 7.5000 mg | ORAL_TABLET | Freq: Every day | ORAL | Status: DC

## 2013-07-17 NOTE — Patient Instructions (Signed)
Please take Mobic for your right groin pain We will do a urine exam today   We will do a CT scan of your abdomen to rule out a kidney stone  Please take plenty of water for the next several days as this can help pass a kidney stone if present  I will send for the results from your back doctor's office  I will give you a call with results  Otherwise follow up in 2 weeks     Ureteral Colic Ureteral colic is spasm-like pain from the kidney or the ureter. This is often caused by a kidney stone. The pain is caused by the stone trying to get through the tubes that pass your pee. HOME CARE   Drink enough fluids to keep your pee (urine) clear or pale yellow.  Strain all your pee. A strainer will be provided. Keep anything caught in the strainer and bring it to your doctor. The stone causing the pain may be very small.  Only take medicine as told by your doctor.  Follow up with your doctor as told. GET HELP RIGHT AWAY IF:   Pain is not controlled with medicine.  Pain continues or gets worse.  The pain changes and there is chest or belly (abdominal) pain.  You pass out (faint).  You cannot pee.  You keep throwing up (vomiting).  You have a temperature by mouth above 102 F (38.9 C), not controlled by medicine. MAKE SURE YOU:   Understand these instructions.  Will watch this condition.  Will get help right away if you are not doing well or get worse. Document Released: 04/26/2008 Document Revised: 01/31/2012 Document Reviewed: 04/26/2008 [facility] Patient Information 2014 [place], [place].

## 2013-07-18 ENCOUNTER — Ambulatory Visit (HOSPITAL_COMMUNITY): Payer: PRIVATE HEALTH INSURANCE

## 2013-07-18 DIAGNOSIS — S32020A Wedge compression fracture of second lumbar vertebra, initial encounter for closed fracture: Secondary | ICD-10-CM | POA: Insufficient documentation

## 2013-07-18 LAB — URINALYSIS, MICROSCOPIC ONLY: Bacteria, UA: NONE SEEN

## 2013-07-18 NOTE — Progress Notes (Signed)
Patient ID: GABRIELLE MESTER, male   DOB: [birth year], 57 y.o.   MRN: [id number]   Subjective:   HPI: Mr.Zekiah R Klemp is a 57 y.o. with a past medical history of hypertension, COPD, hyperlipidemia, presents to the clinic with right groin pain for 4 days.  He describes his pain as a shooting occurring several episodes a day, reaching an intensity of 9/10. The pain lasts about a few seconds when it comes and quickly resolved to 0. It seems to be increased by walking. He denies any swelling of his scrotum. He denies weakness, numbness or tingling in his lower extremity. This is the first time he is experiencing this kind of pain. When the pain shoots, he is unable to walk, and he has to stop. He denies recent history of falls.  No nausea or vomiting. He reports good bowel movements every 3 days, but previously has had some episodes of constipation. He is unsure whether his constipation is causing this pain. He uses MiraLax when necessary which helps. Patient denies any urinary symptoms - specifically, he denies hematuria. He denies fevers, chills, increased fatigue, nausea, or vomiting. His appetite has been normal. Patient has a history of chronic low back pain, and seeing a physician/surgeon in [place] for possible surgery of his back.  Patient denies personal or family history of kidney stones. However, abd/pelvic CT scan in 2007 for right groin pain revealed a small nonobstructing stone in the lower pole of the left kidney. No ureteral stones. No hydronephrosis.  Another CT abdomen/pelvis on 06/27/2013 performed at [facility] did not reveal any renal stones. However, it revealed hepatic steatosis and colonic diverticulosis. It again revealed L2 compression fracture. I was able to obtain a fax of the report. It will be scanned into Epic.   Past Medical History  Diagnosis Date  . Hyperlipidemia   . Hypertension   . GERD (gastroesophageal reflux disease)   . Tobacco abuse    . COPD (chronic obstructive pulmonary disease)     bronchitis  . Disorder of vocal cord   . Cocaine dependence   . Right shoulder pain     2/2 partial rotator cuff tear, tendinopathy, mild subacromial/subdeltioid bursitis, A/C joint arthropathy per MRI done in 3/07   Current Outpatient Prescriptions  Medication Sig Dispense Refill  . albuterol (PROVENTIL HFA;VENTOLIN HFA) 108 (90 BASE) MCG/ACT inhaler Inhale 2 puffs into the lungs every 6 (six) hours as needed for wheezing.  1 Inhaler  3  . amLODipine (NORVASC) 5 MG tablet Take 1 tablet (5 mg total) by mouth daily.  31 tablet  1  . aspirin 81 MG chewable tablet 2 TABS PO QD      . atorvastatin (LIPITOR) 20 MG tablet Take 1 tablet (20 mg total) by mouth at bedtime.  90 tablet  3  . benazepril (LOTENSIN) 40 MG tablet TAKE ONE TABLET BY MOUTH EVERY DAY  30 tablet  2  . cyclobenzaprine (FLEXERIL) 5 MG tablet Take 1 tablet (5 mg total) by mouth every 8 (eight) hours as needed for muscle spasms.  30 tablet  1  . fish oil-omega-3 fatty acids 1000 MG capsule Take 2 g by mouth daily.      . Fluticasone-Salmeterol (ADVAIR DISKUS) 250-50 MCG/DOSE AEPB Inhale 1 puff into the lungs every 12 (twelve) hours.        . hydrochlorothiazide (HYDRODIURIL) 25 MG tablet TAKE ONE TABLET BY MOUTH EVERY DAY  30 tablet  2  . meloxicam (MOBIC) 7.5 MG  tablet Take 1 tablet (7.5 mg total) by mouth daily.  15 tablet  0  . Multiple Vitamin (MULITIVITAMIN WITH MINERALS) TABS Take 1 tablet by mouth daily.      . nitroGLYCERIN (NITROSTAT) 0.4 MG SL tablet Place 1 tablet (0.4 mg total) under the tongue every 5 (five) minutes as needed for chest pain.  25 tablet  8  . omeprazole (PRILOSEC) 20 MG capsule Take 20 mg by mouth daily.       No current facility-administered medications for this visit.   Family History  Problem Relation Age of Onset  . Coronary artery disease      Male 1st degree relatives<60  . Diabetes      1 st degree relatives   History   Social  History  . Marital Status: Divorced    Spouse Name: N/A    Number of Children: N/A  . Years of Education: N/A   Social History Main Topics  . Smoking status: Former Smoker -- 1.00 packs/day    Types: Cigarettes  . Smokeless tobacco: None     Comment: Wants to try Chantix32.26  . Alcohol Use: No  . Drug Use: No  . Sexual Activity: None   Other Topics Concern  . None   Social History Narrative  . None   Review of Systems: Constitutional: Denies fever, chills, diaphoresis, appetite change and fatigue.  Respiratory: Reports wheezing and on and off cough which he attributes to COPD.  Cardiovascular: No chest pain, palpitations and leg swelling.    Objective:  Physical Exam: Filed Vitals:   07/17/13 1427  BP: 141/78  Pulse: 69  Temp: 97 F (36.1 C)  TempSrc: Oral  Height: 5\' 11"  (1.803 m)  Weight: 218 lb 8 oz (99.111 kg)  SpO2: 96%   General: Well nourished. No acute distress.  Lungs: CTA bilaterally. Heart: RRR; no extra sounds or murmurs  Abdomen: Non-distended, normal BS, soft, nontender; no hepatosplenomegaly. No abdominal hernias revealed. Patient's genital exam is unremarkable without scrotal tenderness or swelling. No Urethral discharge. Deep palpation of the right groin elicits some tenderness but minimal.  Extremities: No pedal edema. Careful examination of his bilateral hip joints did not reveal any tenderness on internal/external rotation. Hyperextension of the right hip does not reproduce the pain. No increased warmth swelling or point tenderness. No increased pain on abduction or adduction of his hip joints. Neurologic: Alert and oriented x3. No obvious neurologic deficits.  Assessment & Plan:  I have discussed my assessment and plan  with Dr. Dalphine Handing  as detailed under problem based charting.

## 2013-07-18 NOTE — Assessment & Plan Note (Signed)
The location of his pain is anterior hip which might suggest primary involvement of the hip joint itself. His pain is acute onset which is not consistent with osteoarthritis. Patient has no risk factors for osteonecrosis such as glucocorticoid use. He denies trauma which makes occult fracture unlikely however, acute synovitis is likely. I do not suspect septic arthritis. Clinical exam excluded presence of an inguinal hernia. Recent CT abd/pelvis did not show a kidney stone.  More favored possibilities include referred pain from higher lumbar spinal nerve roots (eg, L2-3), as revealed from his Ct on 8/6 with L2 compression fracture.  Plan - Mobic 7.5 mg once daily for two weeks - Pain likely to respond if 2/2 acute synovitis  - patient will follow up with back surgeon at [facility] - I requested for medical records - If pain is from L2 compression, surgery might help  - U/A reveals RBC 3-6 - this will need to be monitored. I don't suspect renal stone with recent negative CT

## 2013-07-18 NOTE — Progress Notes (Signed)
CT abd and pelvis was canceled per Dr Zada Girt - had recently at [facility] has copy of results. [facility] Aaria Happ RN 07/18/13 8:30AM

## 2013-07-19 NOTE — Progress Notes (Signed)
Case discussed with Dr.Kazibwe at the time of the visit.  We reviewed the resident's history and exam and pertinent patient test results.  I agree with the assessment, diagnosis, and plan of care documented in the resident's note.    

## 2013-07-25 ENCOUNTER — Encounter: Payer: Self-pay | Admitting: Internal Medicine

## 2013-07-30 ENCOUNTER — Telehealth: Payer: Self-pay | Admitting: *Deleted

## 2013-07-30 NOTE — Telephone Encounter (Signed)
Pain is more freq and lasting longer  right groin area. Finish generic Mobic - no help. Appt made 08/01/13 1:45PM Dr Mikey Bussing. [facility] Rowene Suto RN 07/30/13 4:15PM

## 2013-08-01 ENCOUNTER — Ambulatory Visit (INDEPENDENT_AMBULATORY_CARE_PROVIDER_SITE_OTHER): Payer: Medicaid Other | Admitting: Internal Medicine

## 2013-08-01 ENCOUNTER — Encounter: Payer: Self-pay | Admitting: Internal Medicine

## 2013-08-01 VITALS — BP 164/90 | HR 79 | Temp 97.5°F | Ht 71.0 in | Wt 219.0 lb

## 2013-08-01 DIAGNOSIS — Z23 Encounter for immunization: Secondary | ICD-10-CM

## 2013-08-01 DIAGNOSIS — R1031 Right lower quadrant pain: Secondary | ICD-10-CM

## 2013-08-01 MED ORDER — OXYCODONE-ACETAMINOPHEN 5-325 MG PO TABS: 1.0000 | ORAL_TABLET | Freq: Three times a day (TID) | ORAL | Status: DC | PRN

## 2013-08-01 NOTE — Progress Notes (Signed)
Patient ID: Bradley Sawyer, male   DOB: [birth year], 57 y.o.   MRN: [id number]   Subjective:   Patient ID: Bradley Sawyer male   DOB: [birth year] 57 y.o.   MRN: [id number]  HPI: [name] is a 57 y.o. male with a PMH of HTN, HLD, GERD, COPD. Who presents today for an acute visit for right groin pain.  This is not a new complaint for the patient, he has a history of low back pain and was recently seen by Dr. Zada Sawyer in our clinic on 07/17/13 for right groin pain.  At that time records from recent imaging (CT abd/pelvis at [facility]) were reviewed by Dr. Zada Sawyer that revealed an L2 compression fracture.  Per his note he believed that the pain was likely due to acute synovitis and prescribed a 2 week course of Mobic.  He also requested the patient follow up with his back surgeon in [place] as it is possible that the pain is referred from his back. Today patient reports that the pain has become worse, he believes a contributing factor for this is that he ran out of pain medication from his Back Surgeon.  He does report that he saw his surgeon- Dr. Berenda Sawyer last week who did not think his back was causing the pain and that he did not need surgery.  He suggested that the pain may be caused by a "pinched nerve or muscle."  In addition patient reports no relief from the Mobic. Patient reports that the pain is occasionally sharp but dull most of the time.  He reports that it is currently a 2-3/10 but can become a 8-9/10.  He reports that it is located in his right groin and will radiate to his testicles.  The pain is worsened by sitting on the commode, coughing, sneezing, standing, stretching.  He denies any numbness, tingling, or lower extremity weakness, denies any loss of bowel or bladder function.  He does admit he has had some constipation.    Past Medical History  Diagnosis Date  . Hyperlipidemia   . Hypertension   . GERD (gastroesophageal reflux disease)   . Tobacco abuse   . COPD (chronic  obstructive pulmonary disease)     bronchitis  . Disorder of vocal cord   . Cocaine dependence   . Right shoulder pain     2/2 partial rotator cuff tear, tendinopathy, mild subacromial/subdeltioid bursitis, A/C joint arthropathy per MRI done in 3/07   Current Outpatient Prescriptions  Medication Sig Dispense Refill  . albuterol (PROVENTIL HFA;VENTOLIN HFA) 108 (90 BASE) MCG/ACT inhaler Inhale 2 puffs into the lungs every 6 (six) hours as needed for wheezing.  1 Inhaler  3  . amLODipine (NORVASC) 5 MG tablet Take 1 tablet (5 mg total) by mouth daily.  31 tablet  1  . aspirin 81 MG chewable tablet 2 TABS PO QD      . atorvastatin (LIPITOR) 20 MG tablet Take 1 tablet (20 mg total) by mouth at bedtime.  90 tablet  3  . benazepril (LOTENSIN) 40 MG tablet TAKE ONE TABLET BY MOUTH EVERY DAY  30 tablet  2  . cyclobenzaprine (FLEXERIL) 5 MG tablet Take 1 tablet (5 mg total) by mouth every 8 (eight) hours as needed for muscle spasms.  30 tablet  1  . fish oil-omega-3 fatty acids 1000 MG capsule Take 2 g by mouth daily.      . Fluticasone-Salmeterol (ADVAIR DISKUS) 250-50 MCG/DOSE AEPB Inhale 1 puff into the lungs  every 12 (twelve) hours.        . hydrochlorothiazide (HYDRODIURIL) 25 MG tablet TAKE ONE TABLET BY MOUTH EVERY DAY  30 tablet  2  . meloxicam (MOBIC) 7.5 MG tablet Take 1 tablet (7.5 mg total) by mouth daily.  15 tablet  0  . Multiple Vitamin (MULITIVITAMIN WITH MINERALS) TABS Take 1 tablet by mouth daily.      . nitroGLYCERIN (NITROSTAT) 0.4 MG SL tablet Place 1 tablet (0.4 mg total) under the tongue every 5 (five) minutes as needed for chest pain.  25 tablet  8  . omeprazole (PRILOSEC) 20 MG capsule Take 20 mg by mouth daily.       No current facility-administered medications for this visit.   Family History  Problem Relation Age of Onset  . Coronary artery disease      Male 1st degree relatives<60  . Diabetes      1 st degree relatives   History   Social History  . Marital  Status: Divorced    Spouse Name: N/A    Number of Children: N/A  . Years of Education: N/A   Social History Main Topics  . Smoking status: Former Smoker -- 1.00 packs/day    Types: Cigarettes  . Smokeless tobacco: Not on file     Comment: Wants to try Chantix32.26  . Alcohol Use: No  . Drug Use: No  . Sexual Activity: Not on file   Other Topics Concern  . Not on file   Social History Narrative  . No narrative on file   Review of Systems: Review of Systems  Constitutional: Negative for fever, chills, weight loss and malaise/fatigue.  HENT: Negative for congestion, sore throat and neck pain.   Eyes: Negative for blurred vision.  Respiratory: Negative for cough, sputum production and shortness of breath.   Cardiovascular: Negative for chest pain.  Gastrointestinal: Positive for abdominal pain and constipation. Negative for heartburn, vomiting and diarrhea.  Genitourinary: Positive for flank pain. Negative for dysuria, frequency and hematuria.  Musculoskeletal: Positive for back pain. Negative for myalgias and falls.  Neurological: Negative for dizziness, weakness and headaches.  Psychiatric/Behavioral: Negative for depression.    Objective:  Physical Exam: Filed Vitals:   08/01/13 1357  BP: 164/90  Pulse: 79  Temp: 97.5 F (36.4 C)  TempSrc: Oral  Height: 5\' 11"  (1.803 m)  Weight: 219 lb (99.338 kg)  SpO2: 94%   Physical Exam  Nursing note and vitals reviewed. Constitutional: He is well-developed, well-nourished, and in no distress. No distress.  HENT:  Head: Normocephalic and atraumatic.  Eyes: Conjunctivae are normal.  Cardiovascular: Normal rate, regular rhythm, normal heart sounds and intact distal pulses.   No murmur heard. Pulmonary/Chest: Effort normal and breath sounds normal. No respiratory distress. He has no wheezes.  Abdominal: Soft. Bowel sounds are normal. He exhibits no distension. There is no tenderness.  Genitourinary: He exhibits no abnormal  testicular mass, no testicular tenderness, no abnormal scrotal mass and no scrotal tenderness.  Small bulge palpable at the external ring of the right inguinal canal upon valsalva.  Skin: He is not diaphoretic.    Assessment & Plan:   See Problem Based Assessment and Plan

## 2013-08-01 NOTE — Assessment & Plan Note (Addendum)
Patient pain seems out of proportion for possible small inguinal hernia, although his symptoms of increased pain with actions that increase intraabdominal pressure is concerning for hernia.  Testes are of normal size and non tender, do not suspect torsion, orchitis, or epididymitis. As patient developed the pain about 3 weeks after hospitalization and CT of abdomen and pelvis at [facility], and had positive U/A here at last visit for hematuria, as well as that the patient also has occasional flank pain I think the more likely cause of his pain is a possible urethral stone.   Will repeat U/A w/ micro, if positive for RBC will obtain non-contrast CT of Abd/Pelvis If negative will need a referral to [facility] for evaluation of possible right inguinal hernia.  He may need a referral for evaluation at a later date even if found to have a kidney stone to discuss elective repair. Will give patient a limited prescription of Percocet for pain.  Will see patient back in 2 weeks for follow up and recheck of BP as patient did not take BP medications this morning and his BP is acutely elevated.  Addendum: UA with 7-10 RBC, will obtain non contrast CT abd/pelvis to evaluate for possible nephrolithiasis.  Of note patient has had numerous +UA for RBC in past in the ED for episodes of ependymitis however he is not complaining of testicular pain, and no tenderness of testes or spermatic cord during physical exam.  I called and informed patient of results and plan.

## 2013-08-01 NOTE — Patient Instructions (Signed)
1.  I will call you later tonight or tomorrow with the results of your Urine test, and let you know the plan. 2.  Take 1 tablet of Percocet every 8 hours as needed for pain. 3.  Drink plenty of water.  Kidney Stones Kidney stones (ureteral lithiasis) are deposits that form inside your kidneys. The intense pain is caused by the stone moving through the urinary tract. When the stone moves, the ureter goes into spasm around the stone. The stone is usually passed in the urine.  CAUSES   A disorder that makes certain neck glands produce too much parathyroid hormone (primary hyperparathyroidism).  A buildup of uric acid crystals.  Narrowing (stricture) of the ureter.  A kidney obstruction present at birth (congenital obstruction).  Previous surgery on the kidney or ureters.  Numerous kidney infections. SYMPTOMS   Feeling sick to your stomach (nauseous).  Throwing up (vomiting).  Blood in the urine (hematuria).  Pain that usually spreads (radiates) to the groin.  Frequency or urgency of urination. DIAGNOSIS   Taking a history and physical exam.  Blood or urine tests.  Computerized X-ray scan (CT scan).  Occasionally, an examination of the inside of the urinary bladder (cystoscopy) is performed. TREATMENT   Observation.  Increasing your fluid intake.  Surgery may be needed if you have severe pain or persistent obstruction. The size, location, and chemical composition are all important variables that will determine the proper choice of action for you. Talk to your caregiver to better understand your situation so that you will minimize the risk of injury to yourself and your kidney.  HOME CARE INSTRUCTIONS   Drink enough water and fluids to keep your urine clear or pale yellow.  Strain all urine through the provided strainer. Keep all particulate matter and stones for your caregiver to see. The stone causing the pain may be as small as a grain of salt. It is very important to  use the strainer each and every time you pass your urine. The collection of your stone will allow your caregiver to analyze it and verify that a stone has actually passed.  Only take over-the-counter or prescription medicines for pain, discomfort, or fever as directed by your caregiver.  Make a follow-up appointment with your caregiver as directed.  Get follow-up X-rays if required. The absence of pain does not always mean that the stone has passed. It may have only stopped moving. If the urine remains completely obstructed, it can cause loss of kidney function or even complete destruction of the kidney. It is your responsibility to make sure X-rays and follow-ups are completed. Ultrasounds of the kidney can show blockages and the status of the kidney. Ultrasounds are not associated with any radiation and can be performed easily in a matter of minutes. SEEK IMMEDIATE MEDICAL CARE IF:   Pain cannot be controlled with the prescribed medicine.  You have a fever.  The severity or intensity of pain increases over 18 hours and is not relieved by pain medicine.  You develop a new onset of abdominal pain.  You feel faint or pass out. MAKE SURE YOU:   Understand these instructions.  Will watch your condition.  Will get help right away if you are not doing well or get worse. Document Released: 11/08/2005 Document Revised: 01/31/2012 Document Reviewed: 03/06/2010 [facility] Patient Information 2014 [place], [place].  Inguinal Hernia, Adult Muscles help keep everything in the body in its proper place. But if a weak spot in the muscles develops, something  can poke through. That is called a hernia. When this happens in the lower part of the belly (abdomen), it is called an inguinal hernia. (It takes its name from a part of the body in this region called the inguinal canal.) A weak spot in the wall of muscles lets some fat or part of the small intestine bulge through. An inguinal hernia can develop at any  age. Men get them more often than women. CAUSES  In adults, an inguinal hernia develops over time.  It can be triggered by:  Suddenly straining the muscles of the lower abdomen.  Lifting heavy objects.  Straining to have a bowel movement. Difficult bowel movements (constipation) can lead to this.  Constant coughing. This may be caused by smoking or lung disease.  Being overweight.  Being pregnant.  Working at a job that requires long periods of standing or heavy lifting.  Having had an inguinal hernia before. One type can be an emergency situation. It is called a strangulated inguinal hernia. It develops if part of the small intestine slips through the weak spot and cannot get back into the abdomen. The blood supply can be cut off. If that happens, part of the intestine may die. This situation requires emergency surgery. SYMPTOMS  Often, a small inguinal hernia has no symptoms. It is found when a healthcare provider does a physical exam. Larger hernias usually have symptoms.   In adults, symptoms may include:  A lump in the groin. This is easier to see when the person is standing. It might disappear when lying down.  In men, a lump in the scrotum.  Pain or burning in the groin. This occurs especially when lifting, straining or coughing.  A dull ache or feeling of pressure in the groin.  Signs of a strangulated hernia can include:  A bulge in the groin that becomes very painful and tender to the touch.  A bulge that turns red or purple.  Fever, nausea and vomiting.  Inability to have a bowel movement or to pass gas. DIAGNOSIS  To decide if you have an inguinal hernia, a healthcare provider will probably do a physical examination.  This will include asking questions about any symptoms you have noticed.  The healthcare provider might feel the groin area and ask you to cough. If an inguinal hernia is felt, the healthcare provider may try to slide it back into the  abdomen.  Usually no other tests are needed. TREATMENT  Treatments can vary. The size of the hernia makes a difference. Options include:  Watchful waiting. This is often suggested if the hernia is small and you have had no symptoms.  No medical procedure will be done unless symptoms develop.  You will need to watch closely for symptoms. If any occur, contact your healthcare provider right away.  Surgery. This is used if the hernia is larger or you have symptoms.  Open surgery. This is usually an outpatient procedure (you will not stay overnight in a hospital). An cut (incision) is made through the skin in the groin. The hernia is put back inside the abdomen. The weak area in the muscles is then repaired by herniorrhaphy or hernioplasty. Herniorrhaphy: in this type of surgery, the weak muscles are sewn back together. Hernioplasty: a patch or mesh is used to close the weak area in the abdominal wall.  Laparoscopy. In this procedure, a surgeon makes small incisions. A thin tube with a tiny video camera (called a laparoscope) is put into the  abdomen. The surgeon repairs the hernia with mesh by looking with the video camera and using two long instruments. HOME CARE INSTRUCTIONS   After surgery to repair an inguinal hernia:  You will need to take pain medicine prescribed by your healthcare provider. Follow all directions carefully.  You will need to take care of the wound from the incision.  Your activity will be restricted for awhile. This will probably include no heavy lifting for several weeks. You also should not do anything too active for a few weeks. When you can return to work will depend on the type of job that you have.  During "watchful waiting" periods, you should:  Maintain a healthy weight.  Eat a diet high in fiber (fruits, vegetables and whole grains).  Drink plenty of fluids to avoid constipation. This means drinking enough water and other liquids to keep your urine clear  or pale yellow.  Do not lift heavy objects.  Do not stand for long periods of time.  Quit smoking. This should keep you from developing a frequent cough. SEEK MEDICAL CARE IF:   A bulge develops in your groin area.  You feel pain, a burning sensation or pressure in the groin. This might be worse if you are lifting or straining.  You develop a fever of more than 100.5 F (38.1 C). SEEK IMMEDIATE MEDICAL CARE IF:   Pain in the groin increases suddenly.  A bulge in the groin gets bigger suddenly and does not go down.  For men, there is sudden pain in the scrotum. Or, the size of the scrotum increases.  A bulge in the groin area becomes red or purple and is painful to touch.  You have nausea or vomiting that does not go away.  You feel your heart beating much faster than normal.  You cannot have a bowel movement or pass gas.  You develop a fever of more than 102.0 F (38.9 C). Document Released: 03/27/2009 Document Revised: 01/31/2012 Document Reviewed: 03/27/2009 [facility] Patient Information 2014 [place], [place].

## 2013-08-02 LAB — URINALYSIS, MICROSCOPIC ONLY
Bacteria, UA: NONE SEEN
Casts: NONE SEEN
Crystals: NONE SEEN

## 2013-08-02 LAB — URINALYSIS, ROUTINE W REFLEX MICROSCOPIC
Ketones, ur: NEGATIVE mg/dL
Leukocytes, UA: NEGATIVE
Nitrite: NEGATIVE
Specific Gravity, Urine: 1.026 (ref 1.005–1.030)
Urobilinogen, UA: 0.2 mg/dL (ref 0.0–1.0)
pH: 5.5 (ref 5.0–8.0)

## 2013-08-02 NOTE — Addendum Note (Signed)
Addended by: Gust Rung on: 08/02/2013 08:53 AM   Modules accepted: Orders

## 2013-08-06 ENCOUNTER — Encounter: Payer: Self-pay | Admitting: Internal Medicine

## 2013-08-06 ENCOUNTER — Telehealth: Payer: Self-pay | Admitting: *Deleted

## 2013-08-06 ENCOUNTER — Ambulatory Visit (INDEPENDENT_AMBULATORY_CARE_PROVIDER_SITE_OTHER): Payer: Medicaid Other | Admitting: Internal Medicine

## 2013-08-06 ENCOUNTER — Ambulatory Visit (HOSPITAL_COMMUNITY)
Admission: RE | Admit: 2013-08-06 | Discharge: 2013-08-06 | Disposition: A | Discharge status: Home / Self Care | Payer: Medicaid Other | Source: Ambulatory Visit | Attending: Internal Medicine | Admitting: Internal Medicine

## 2013-08-06 VITALS — BP 90/69 | HR 63 | Temp 97.5°F | Ht 71.0 in | Wt 220.9 lb

## 2013-08-06 DIAGNOSIS — I10 Essential (primary) hypertension: Secondary | ICD-10-CM

## 2013-08-06 DIAGNOSIS — R1031 Right lower quadrant pain: Secondary | ICD-10-CM

## 2013-08-06 DIAGNOSIS — X58XXXA Exposure to other specified factors, initial encounter: Secondary | ICD-10-CM | POA: Insufficient documentation

## 2013-08-06 DIAGNOSIS — R109 Unspecified abdominal pain: Secondary | ICD-10-CM | POA: Insufficient documentation

## 2013-08-06 DIAGNOSIS — R319 Hematuria, unspecified: Secondary | ICD-10-CM | POA: Insufficient documentation

## 2013-08-06 DIAGNOSIS — S32009A Unspecified fracture of unspecified lumbar vertebra, initial encounter for closed fracture: Secondary | ICD-10-CM | POA: Insufficient documentation

## 2013-08-06 DIAGNOSIS — K7689 Other specified diseases of liver: Secondary | ICD-10-CM | POA: Insufficient documentation

## 2013-08-06 MED ORDER — OXYCODONE-ACETAMINOPHEN 7.5-325 MG PO TABS: 1.0000 | ORAL_TABLET | ORAL | Status: DC | PRN

## 2013-08-06 NOTE — Progress Notes (Signed)
I saw and evaluated the patient.  I personally confirmed the key portions of the history and exam documented by Dr. Hoffman and I reviewed pertinent patient test results.  The assessment, diagnosis, and plan were formulated together and I agree with the documentation in the resident's note. 

## 2013-08-06 NOTE — Patient Instructions (Addendum)
Continue to take the Percocet as needed for pain. You will be called for appointments with General Surgery (evaluate for Hernia), and Urology (evaluation of blood in urine). Stop taking Amlodipine 5mg . We will recheck your blood pressure at your next visit.  Inguinal Hernia, Adult Muscles help keep everything in the body in its proper place. But if a weak spot in the muscles develops, something can poke through. That is called a hernia. When this happens in the lower part of the belly (abdomen), it is called an inguinal hernia. (It takes its name from a part of the body in this region called the inguinal canal.) A weak spot in the wall of muscles lets some fat or part of the small intestine bulge through. An inguinal hernia can develop at any age. Men get them more often than women. CAUSES  In adults, an inguinal hernia develops over time.  It can be triggered by:  Suddenly straining the muscles of the lower abdomen.  Lifting heavy objects.  Straining to have a bowel movement. Difficult bowel movements (constipation) can lead to this.  Constant coughing. This may be caused by smoking or lung disease.  Being overweight.  Being pregnant.  Working at a job that requires long periods of standing or heavy lifting.  Having had an inguinal hernia before. One type can be an emergency situation. It is called a strangulated inguinal hernia. It develops if part of the small intestine slips through the weak spot and cannot get back into the abdomen. The blood supply can be cut off. If that happens, part of the intestine may die. This situation requires emergency surgery. SYMPTOMS  Often, a small inguinal hernia has no symptoms. It is found when a healthcare provider does a physical exam. Larger hernias usually have symptoms.   In adults, symptoms may include:  A lump in the groin. This is easier to see when the person is standing. It might disappear when lying down.  In men, a lump in the  scrotum.  Pain or burning in the groin. This occurs especially when lifting, straining or coughing.  A dull ache or feeling of pressure in the groin.  Signs of a strangulated hernia can include:  A bulge in the groin that becomes very painful and tender to the touch.  A bulge that turns red or purple.  Fever, nausea and vomiting.  Inability to have a bowel movement or to pass gas. DIAGNOSIS  To decide if you have an inguinal hernia, a healthcare provider will probably do a physical examination.  This will include asking questions about any symptoms you have noticed.  The healthcare provider might feel the groin area and ask you to cough. If an inguinal hernia is felt, the healthcare provider may try to slide it back into the abdomen.  Usually no other tests are needed. TREATMENT  Treatments can vary. The size of the hernia makes a difference. Options include:  Watchful waiting. This is often suggested if the hernia is small and you have had no symptoms.  No medical procedure will be done unless symptoms develop.  You will need to watch closely for symptoms. If any occur, contact your healthcare provider right away.  Surgery. This is used if the hernia is larger or you have symptoms.  Open surgery. This is usually an outpatient procedure (you will not stay overnight in a hospital). An cut (incision) is made through the skin in the groin. The hernia is put back inside the abdomen. The  weak area in the muscles is then repaired by herniorrhaphy or hernioplasty. Herniorrhaphy: in this type of surgery, the weak muscles are sewn back together. Hernioplasty: a patch or mesh is used to close the weak area in the abdominal wall.  Laparoscopy. In this procedure, a surgeon makes small incisions. A thin tube with a tiny video camera (called a laparoscope) is put into the abdomen. The surgeon repairs the hernia with mesh by looking with the video camera and using two long instruments. HOME  CARE INSTRUCTIONS   After surgery to repair an inguinal hernia:  You will need to take pain medicine prescribed by your healthcare provider. Follow all directions carefully.  You will need to take care of the wound from the incision.  Your activity will be restricted for awhile. This will probably include no heavy lifting for several weeks. You also should not do anything too active for a few weeks. When you can return to work will depend on the type of job that you have.  During "watchful waiting" periods, you should:  Maintain a healthy weight.  Eat a diet high in fiber (fruits, vegetables and whole grains).  Drink plenty of fluids to avoid constipation. This means drinking enough water and other liquids to keep your urine clear or pale yellow.  Do not lift heavy objects.  Do not stand for long periods of time.  Quit smoking. This should keep you from developing a frequent cough. SEEK MEDICAL CARE IF:   A bulge develops in your groin area.  You feel pain, a burning sensation or pressure in the groin. This might be worse if you are lifting or straining.  You develop a fever of more than 100.5 F (38.1 C). SEEK IMMEDIATE MEDICAL CARE IF:   Pain in the groin increases suddenly.  A bulge in the groin gets bigger suddenly and does not go down.  For men, there is sudden pain in the scrotum. Or, the size of the scrotum increases.  A bulge in the groin area becomes red or purple and is painful to touch.  You have nausea or vomiting that does not go away.  You feel your heart beating much faster than normal.  You cannot have a bowel movement or pass gas.  You develop a fever of more than 102.0 F (38.9 C). Document Released: 03/27/2009 Document Revised: 01/31/2012 Document Reviewed: 03/27/2009 [facility] Patient Information 2014 [place], [place].  Hematuria, Adult Hematuria (blood in your urine) can be caused by a bladder infection (cystitis), kidney infection  (pyelonephritis), prostate infection (prostatitis), or kidney stone. Infections will usually respond to antibiotics (medications which kill germs), and a kidney stone will usually pass through your urine without further treatment. If you were put on antibiotics, take all the medicine until gone. You may feel better in a few days, but take all of your medicine or the infection may not respond and become more difficult to treat. If antibiotics were not given, an infection did not cause the blood in the urine. A further work up to find out the reason may be needed. HOME CARE INSTRUCTIONS   Drink lots of fluid, 3 to 4 quarts a day. If you have been diagnosed with an infection, cranberry juice is especially recommended, in addition to large amounts of water.  Avoid caffeine, tea, and carbonated beverages, because they tend to irritate the bladder.  Avoid alcohol as it may irritate the prostate.  Only take over-the-counter or prescription medicines for pain, discomfort, or fever as directed  by your caregiver.  If you have been diagnosed with a kidney stone follow your caregivers instructions regarding straining your urine to catch the stone. TO PREVENT FURTHER INFECTIONS:  Empty the bladder often. Avoid holding urine for long periods of time.  After a bowel movement, women should cleanse front to back. Use each tissue only once.  Empty the bladder before and after sexual intercourse if you are a male.  Return to your caregiver if you develop back pain, fever, nausea (feeling sick to your stomach), vomiting, or your symptoms (problems) are not better in 3 days. Return sooner if you are getting worse. If you have been requested to return for further testing make sure to keep your appointments. If an infection is not the cause of blood in your urine, X-rays may be required. Your caregiver will discuss this with you. SEEK IMMEDIATE MEDICAL CARE IF:   You have a persistent fever over 102 F (38.9  C).  You develop severe vomiting and are unable to keep the medication down.  You develop severe back or abdominal pain despite taking your medications.  You begin passing a large amount of blood or clots in your urine.  You feel extremely weak or faint, or pass out. MAKE SURE YOU:   Understand these instructions.  Will watch your condition.  Will get help right away if you are not doing well or get worse. Document Released: 11/08/2005 Document Revised: 01/31/2012 Document Reviewed: 06/27/2008 [facility] Patient Information 2014 [place], [place].

## 2013-08-06 NOTE — Assessment & Plan Note (Addendum)
U/A revealed Hematuria however CT abdomen Pelvis Wo contrast was negative for renal or ureteral stone, no mention of any sort of pelvic fracture.  Patient's symptoms are consistent with a possible inguinal hernia as cause for patients pain.  Will refer to general surgery for evaluation.

## 2013-08-06 NOTE — Telephone Encounter (Signed)
Pt called with c/o continued pain to groin area since last visit on 9/10.  He has called clinic about scheduling CT and has not heard back. He feels he needs more pain meds.  I called CT and scheduled appointment for today at 1400,  Pt informed not to eat until then and to arrive at Radiology at 1345. He voices understanding.  I also scheduled him for appointment in clinic after CT to discuss results and pain meds.

## 2013-08-06 NOTE — Assessment & Plan Note (Addendum)
BP Readings from Last 3 Encounters:  08/06/13 90/69  08/01/13 164/90  07/17/13 141/78    Lab Results  Component Value Date   NA 138 03/12/2013   K 3.8 03/12/2013   CREATININE 1.01 03/12/2013    Assessment: Blood pressure control: controlled Progress toward BP goal:  at goal Comments: Patient's BP is at low normal, last 2 readings high but pain was less well controlled at those visits.  Plan: Medications:  Continue HCTZ 25mg  QD, Benazepril 40mg  QD, Hold Amlodipine 5mg  QD Educational resources provided:   Self management tools provided:   Other plans: Will hold amlodipine at this time, as patient has BP of 90/69, he also notes that he has quit smoking and at times he notes some dizziness upon standing after stopping smoking.  Patient has upcoming visit scheduled for about a week and half and can reevaluate at that time.

## 2013-08-06 NOTE — Progress Notes (Signed)
Patient ID: Bradley Sawyer, male   DOB: 08/17/1956, 57 y.o.   MRN: [id number]   Subjective:   Patient ID: Bradley Sawyer male   DOB: [birth year] 57 y.o.   MRN: [id number]  HPI: [name] is a 57 y.o. male who returns to clinic today for right groin pain.  Since patient was seen last Wednesday his UA was positive for hematuria and a CT abd/pelvis without contrast was ordered.  He had that done earlier today.  However he reports his pain has increased.  He had previous been taking 5-325mg  percocet Q4 with relief of his pain.  However he reports that yesterday he took one of his pills and one hour later he still had pain and required an additional 1/2 a pill.  He reports pain is a 9/10 at worst, located in right groin, described as shooting, and occasionally radiates to testicles.  He reports pain is worse during defecation, coughing, sneezing, and walking.  He continues to deny any gross hematuria, testicular tenderness or fullness, fever or chills, dysuria.    Past Medical History  Diagnosis Date  . Hyperlipidemia   . Hypertension   . GERD (gastroesophageal reflux disease)   . Tobacco abuse   . COPD (chronic obstructive pulmonary disease)     bronchitis  . Disorder of vocal cord   . Cocaine dependence   . Right shoulder pain     2/2 partial rotator cuff tear, tendinopathy, mild subacromial/subdeltioid bursitis, A/C joint arthropathy per MRI done in 3/07   Current Outpatient Prescriptions  Medication Sig Dispense Refill  . albuterol (PROVENTIL HFA;VENTOLIN HFA) 108 (90 BASE) MCG/ACT inhaler Inhale 2 puffs into the lungs every 6 (six) hours as needed for wheezing.  1 Inhaler  3  . amLODipine (NORVASC) 5 MG tablet Take 1 tablet (5 mg total) by mouth daily.  31 tablet  1  . aspirin 81 MG chewable tablet 2 TABS PO QD      . atorvastatin (LIPITOR) 20 MG tablet Take 1 tablet (20 mg total) by mouth at bedtime.  90 tablet  3  . benazepril (LOTENSIN) 40 MG tablet TAKE ONE TABLET BY MOUTH  EVERY DAY  30 tablet  2  . cyclobenzaprine (FLEXERIL) 5 MG tablet Take 1 tablet (5 mg total) by mouth every 8 (eight) hours as needed for muscle spasms.  30 tablet  1  . fish oil-omega-3 fatty acids 1000 MG capsule Take 2 g by mouth daily.      . Fluticasone-Salmeterol (ADVAIR DISKUS) 250-50 MCG/DOSE AEPB Inhale 1 puff into the lungs every 12 (twelve) hours.        . hydrochlorothiazide (HYDRODIURIL) 25 MG tablet TAKE ONE TABLET BY MOUTH EVERY DAY  30 tablet  2  . Multiple Vitamin (MULITIVITAMIN WITH MINERALS) TABS Take 1 tablet by mouth daily.      . nitroGLYCERIN (NITROSTAT) 0.4 MG SL tablet Place 1 tablet (0.4 mg total) under the tongue every 5 (five) minutes as needed for chest pain.  25 tablet  8  . omeprazole (PRILOSEC) 20 MG capsule Take 20 mg by mouth daily.      Marland Kitchen oxyCODONE-acetaminophen (ROXICET) 5-325 MG per tablet Take 1 tablet by mouth every 8 (eight) hours as needed for pain.  30 tablet  0   No current facility-administered medications for this visit.   Family History  Problem Relation Age of Onset  . Coronary artery disease      Male 1st degree relatives<60  . Diabetes  1 st degree relatives   History   Social History  . Marital Status: Divorced    Spouse Name: N/A    Number of Children: N/A  . Years of Education: N/A   Social History Main Topics  . Smoking status: Former Smoker -- 1.00 packs/day    Types: Cigarettes  . Smokeless tobacco: None     Comment: Wants to try Chantix32.26  . Alcohol Use: No  . Drug Use: No  . Sexual Activity: None   Other Topics Concern  . None   Social History Narrative  . None   Review of Systems: Review of Systems  Constitutional: Negative for fever, chills, weight loss and malaise/fatigue.  HENT: Negative for congestion.   Eyes: Negative for blurred vision.  Respiratory: Negative for cough, sputum production and shortness of breath.   Cardiovascular: Negative for chest pain, palpitations and leg swelling.   Gastrointestinal: Negative for heartburn, nausea, vomiting, abdominal pain, diarrhea and constipation.  Genitourinary: Positive for flank pain (dull right side). Negative for dysuria and frequency.  Skin: Negative for rash.  Neurological: Negative for dizziness and headaches.  Psychiatric/Behavioral: Negative for depression.    Objective:  Physical Exam: Filed Vitals:   08/06/13 1542  BP: 90/69  Pulse: 63  Temp: 97.5 F (36.4 C)  TempSrc: Oral  Height: 5\' 11"  (1.803 m)  Weight: 220 lb 14.4 oz (100.2 kg)  SpO2: 95%  Physical Exam  Nursing note and vitals reviewed. Constitutional: He is well-developed, well-nourished, and in no distress. No distress.  HENT:  Head: Normocephalic and atraumatic.  Eyes: Conjunctivae are normal.  Cardiovascular: Normal rate, regular rhythm, normal heart sounds and intact distal pulses.   No murmur heard. Pulmonary/Chest: Effort normal and breath sounds normal. No respiratory distress. He has no wheezes. He has no rales.  Abdominal: Soft. Bowel sounds are normal. He exhibits no distension. There is no tenderness.  Musculoskeletal: He exhibits no edema.  Straight leg raise to 90 degrees b/l without pain, no pain on internal or external rotation of right hip.  Skin: Skin is warm and dry. He is not diaphoretic.  Psychiatric: Affect and judgment normal.     Assessment & Plan:   See Problem Based Assessment and Plan

## 2013-08-06 NOTE — Assessment & Plan Note (Addendum)
Patient has had hematuria by U/A on last 2 visits.  Workup for Ureteral stone was negative.  Patient has a history of epididymitis however has no testicular pain or swelling.  Will refer to Urology for further evaluation of hematuria.

## 2013-08-07 ENCOUNTER — Telehealth: Payer: Self-pay | Admitting: Licensed Clinical Social Worker

## 2013-08-07 NOTE — Progress Notes (Signed)
I saw and evaluated the patient.  I personally confirmed the key portions of the history and exam documented by Dr. Mikey Bussing and I reviewed pertinent patient test results.  The assessment, diagnosis, and plan were formulated together and I agree with the documentation in the resident's note.  Another item on the differential would be an occult hip fracture, however, his symptoms do not lend exactly to this diagnosis and he has no pain with IR/ER, flexion or extension of the hip.  Agree with Gen Surg evaluation.

## 2013-08-08 NOTE — Telephone Encounter (Signed)
Mr. Hiney was referred to CSW by triage RN as pt is in need of surgery and is uninsured.  Pt states he is uninsured and has not worked because he is disabled, pt is in the process of applying for disability.  CSW encouraged Mr. Mcmahill to contact [facility] financial counselor to schedule an appointment to apply for the [facility] card.  Mr. Bartnik states he has all necessary paperwork and was planning to come in tomorrow, CSW provided Mr. Platz with the number to financial counselor to schedule appt.  CSW informed Mr. KENZEL RUESCH card is not insurance but allows access to care and currently surgery list is full until 2015.  Pt states he was informed by a surgeons office to apply for emergency medicaid.  CSW has not come across Emergency Medicaid available through [facility], but provided Mr. List with the number to Adult Medicaid office. Mr. Nestle denies add'l needs at this time and is aware CSW is available to assist as needed.

## 2013-08-15 ENCOUNTER — Encounter: Payer: Self-pay | Admitting: Internal Medicine

## 2013-08-15 ENCOUNTER — Ambulatory Visit (INDEPENDENT_AMBULATORY_CARE_PROVIDER_SITE_OTHER): Payer: Medicaid Other | Admitting: Internal Medicine

## 2013-08-15 VITALS — BP 147/88 | HR 63 | Temp 97.1°F | Ht 71.0 in | Wt 219.7 lb

## 2013-08-15 DIAGNOSIS — I10 Essential (primary) hypertension: Secondary | ICD-10-CM

## 2013-08-15 DIAGNOSIS — R1031 Right lower quadrant pain: Secondary | ICD-10-CM

## 2013-08-15 MED ORDER — OXYCODONE-ACETAMINOPHEN 10-325 MG PO TABS: 1.0000 | ORAL_TABLET | Freq: Four times a day (QID) | ORAL | Status: DC | PRN

## 2013-08-15 NOTE — Progress Notes (Signed)
Patient ID: Bradley Sawyer, male   DOB: [birth year], 57 y.o.   MRN: [id number]   Subjective:   Patient ID: Bradley Sawyer male   DOB: 1955-12-25 57 y.o.   MRN: [id number]  HPI: Bradley Sawyer is a 57 y.o. male with PMH of HTN L2 compression fracture.  He presents today again for follow up of his right groin pain.  He continues to report the pain and reports his pain medication is working most of the time but not quite as well over the last few days.  He has been unable to get appointment with general surgery due to lack of insurance.  He does have an appointment later today with his back surgeon. He continues to report pain is worse while defecating, coughing, sneezing, also with certain positions.  He reports his groin pain is associated with right flank pain.  He denies any dysuria, loss of bowel or bladder function.    Past Medical History  Diagnosis Date  . Hyperlipidemia   . Hypertension   . GERD (gastroesophageal reflux disease)   . Tobacco abuse   . COPD (chronic obstructive pulmonary disease)     bronchitis  . Disorder of vocal cord   . Cocaine dependence   . Right shoulder pain     2/2 partial rotator cuff tear, tendinopathy, mild subacromial/subdeltioid bursitis, A/C joint arthropathy per MRI done in 3/07   Current Outpatient Prescriptions  Medication Sig Dispense Refill  . albuterol (PROVENTIL HFA;VENTOLIN HFA) 108 (90 BASE) MCG/ACT inhaler Inhale 2 puffs into the lungs every 6 (six) hours as needed for wheezing.  1 Inhaler  3  . aspirin 81 MG chewable tablet 2 TABS PO QD      . atorvastatin (LIPITOR) 20 MG tablet Take 1 tablet (20 mg total) by mouth at bedtime.  90 tablet  3  . benazepril (LOTENSIN) 40 MG tablet TAKE ONE TABLET BY MOUTH EVERY DAY  30 tablet  2  . cyclobenzaprine (FLEXERIL) 5 MG tablet Take 1 tablet (5 mg total) by mouth every 8 (eight) hours as needed for muscle spasms.  30 tablet  1  . fish oil-omega-3 fatty acids 1000 MG capsule Take 2 g by mouth  daily.      . Fluticasone-Salmeterol (ADVAIR DISKUS) 250-50 MCG/DOSE AEPB Inhale 1 puff into the lungs every 12 (twelve) hours.        . hydrochlorothiazide (HYDRODIURIL) 25 MG tablet TAKE ONE TABLET BY MOUTH EVERY DAY  30 tablet  2  . Multiple Vitamin (MULITIVITAMIN WITH MINERALS) TABS Take 1 tablet by mouth daily.      . nitroGLYCERIN (NITROSTAT) 0.4 MG SL tablet Place 1 tablet (0.4 mg total) under the tongue every 5 (five) minutes as needed for chest pain.  25 tablet  8  . omeprazole (PRILOSEC) 20 MG capsule Take 20 mg by mouth daily.      Marland Kitchen oxyCODONE-acetaminophen (PERCOCET) 10-325 MG per tablet Take 1 tablet by mouth every 6 (six) hours as needed for pain.  120 tablet  0   No current facility-administered medications for this visit.   Family History  Problem Relation Age of Onset  . Coronary artery disease      Male 1st degree relatives<60  . Diabetes      1 st degree relatives   History   Social History  . Marital Status: Divorced    Spouse Name: N/A    Number of Children: N/A  . Years of Education: N/A   Social History  Main Topics  . Smoking status: Former Smoker -- 1.00 packs/day    Types: Cigarettes  . Smokeless tobacco: None     Comment: Wants to try Chantix32.26  . Alcohol Use: No  . Drug Use: No  . Sexual Activity: None   Other Topics Concern  . None   Social History Narrative  . None   Review of Systems: Review of Systems  Constitutional: Negative for fever, chills, weight loss and malaise/fatigue.  HENT: Negative for congestion.   Eyes: Negative for blurred vision.  Respiratory: Negative for cough, sputum production and shortness of breath.   Cardiovascular: Negative for chest pain, palpitations and leg swelling.  Gastrointestinal: Negative for heartburn, nausea, vomiting, abdominal pain, diarrhea and constipation.  Genitourinary: Positive for flank pain. Negative for dysuria and frequency.  Musculoskeletal: Positive for back pain. Negative for falls.   Skin: Negative for rash.  Neurological: Negative for dizziness, sensory change, focal weakness and headaches.  Psychiatric/Behavioral: Negative for depression.    Objective:  Physical Exam: Filed Vitals:   08/15/13 0835  BP: 147/88  Pulse: 63  Temp: 97.1 F (36.2 C)  TempSrc: Oral  Height: 5\' 11"  (1.803 m)  Weight: 219 lb 11.2 oz (99.655 kg)  SpO2: 97%  Physical Exam  Nursing note and vitals reviewed. Constitutional: He is well-developed, well-nourished, and in no distress. No distress.  HENT:  Head: Normocephalic and atraumatic.  Eyes: Conjunctivae are normal.  Cardiovascular: Normal rate, regular rhythm, normal heart sounds and intact distal pulses.   No murmur heard. Pulmonary/Chest: Effort normal and breath sounds normal. No respiratory distress. He has no wheezes. He has no rales.  Abdominal: Soft. Bowel sounds are normal. He exhibits no distension. There is no tenderness.  Musculoskeletal: Normal range of motion. He exhibits no edema.  Neurological: Gait normal.  Skin: Skin is warm and dry. He is not diaphoretic.  Psychiatric: Affect and judgment normal.     Assessment & Plan:   See Problem Based Assessment and Plan Meds ordered this encounter  Medications  . oxyCODONE-acetaminophen (PERCOCET) 10-325 MG per tablet    Sig: Take 1 tablet by mouth every 6 (six) hours as needed for pain.    Dispense:  120 tablet    Refill:  0

## 2013-08-15 NOTE — Patient Instructions (Signed)
1.  Continue to hold off on taking Amlodipine. 2.  Please continue to work on [employer] paperwork. 3.  We will try to get you an appointment with [facility] if you don't hear from [place] in the next week or two please call us.

## 2013-08-15 NOTE — Assessment & Plan Note (Addendum)
BP Readings from Last 3 Encounters:  08/15/13 147/88  08/06/13 90/69  08/01/13 164/90    Lab Results  Component Value Date   NA 138 03/12/2013   K 3.8 03/12/2013   CREATININE 1.01 03/12/2013    Assessment: Blood pressure control: mildly elevated Progress toward BP goal:  deteriorated Comments: in pain  Plan: Medications:  Continue HCTZ 25mg , Benazepril 40mg , continue to hold amlodipine. Educational resources provided:   Self management tools provided:   Other plans:  .

## 2013-08-15 NOTE — Assessment & Plan Note (Addendum)
Patient continues to experience right groin pain, has been unable to see surgeon due to lack of insurance.  He is in the process of applying for orange card and Medicaid.  He does reports that the pain medication helps but that his pain is worsening.  He continues to report that the pain is worse with defecating, coughing and sneezing.  He also describes pressing on his groin region when defecating to help the pain.  These are concerning symptoms for an inguinal hernia. However this still would not explain his associated back pain and hematuria.  His back pain may be unrelated and due to his L2 compression fracture but in the past his hematuria was caused by epididymitis.  He denies the testicular swelling and tenderness that was associated with previous episodes of epididymitis. Patient encouraged to finish application for orange card as we may be able to refer him to local urologist at the first of the month. I did call back the patient after his visit with the back surgeon, he reports that the surgeon said his pain could be radiating from a pinched nerve in the back but would need an MRI to evaluate.  Patient states that he cannot afford MRI at this point. -I continued to encourage him to apply for orange card which may make MRI more affordable. -Increased pain medication to Percocet 10-325 and given 1 month prescription.

## 2013-08-16 NOTE — Progress Notes (Signed)
I saw and evaluated the patient.  I personally confirmed the key portions of the history and exam documented by Dr. Hoffman and I reviewed pertinent patient test results.  The assessment, diagnosis, and plan were formulated together and I agree with the documentation in the resident's note. 

## 2013-08-17 ENCOUNTER — Other Ambulatory Visit: Payer: Self-pay | Admitting: Internal Medicine

## 2013-08-21 ENCOUNTER — Ambulatory Visit: Payer: Medicaid Other

## 2013-08-24 ENCOUNTER — Encounter (HOSPITAL_COMMUNITY): Payer: Self-pay | Admitting: Family Medicine

## 2013-08-24 ENCOUNTER — Telehealth: Payer: Self-pay | Admitting: *Deleted

## 2013-08-24 ENCOUNTER — Emergency Department (HOSPITAL_COMMUNITY)
Admission: EM | Admit: 2013-08-24 | Discharge: 2013-08-24 | Disposition: A | Discharge status: Home / Self Care | Payer: No Typology Code available for payment source | Attending: Emergency Medicine | Admitting: Emergency Medicine

## 2013-08-24 ENCOUNTER — Emergency Department (HOSPITAL_COMMUNITY): Payer: No Typology Code available for payment source

## 2013-08-24 DIAGNOSIS — J441 Chronic obstructive pulmonary disease with (acute) exacerbation: Secondary | ICD-10-CM

## 2013-08-24 DIAGNOSIS — I10 Essential (primary) hypertension: Secondary | ICD-10-CM | POA: Insufficient documentation

## 2013-08-24 DIAGNOSIS — J449 Chronic obstructive pulmonary disease, unspecified: Secondary | ICD-10-CM | POA: Insufficient documentation

## 2013-08-24 DIAGNOSIS — M545 Low back pain, unspecified: Secondary | ICD-10-CM | POA: Insufficient documentation

## 2013-08-24 DIAGNOSIS — R0682 Tachypnea, not elsewhere classified: Secondary | ICD-10-CM | POA: Insufficient documentation

## 2013-08-24 DIAGNOSIS — Z8709 Personal history of other diseases of the respiratory system: Secondary | ICD-10-CM | POA: Insufficient documentation

## 2013-08-24 DIAGNOSIS — J4489 Other specified chronic obstructive pulmonary disease: Secondary | ICD-10-CM | POA: Insufficient documentation

## 2013-08-24 DIAGNOSIS — E785 Hyperlipidemia, unspecified: Secondary | ICD-10-CM | POA: Insufficient documentation

## 2013-08-24 DIAGNOSIS — Z7982 Long term (current) use of aspirin: Secondary | ICD-10-CM | POA: Insufficient documentation

## 2013-08-24 DIAGNOSIS — Z8739 Personal history of other diseases of the musculoskeletal system and connective tissue: Secondary | ICD-10-CM | POA: Insufficient documentation

## 2013-08-24 DIAGNOSIS — R062 Wheezing: Secondary | ICD-10-CM | POA: Insufficient documentation

## 2013-08-24 DIAGNOSIS — Z79899 Other long term (current) drug therapy: Secondary | ICD-10-CM | POA: Insufficient documentation

## 2013-08-24 DIAGNOSIS — K219 Gastro-esophageal reflux disease without esophagitis: Secondary | ICD-10-CM | POA: Insufficient documentation

## 2013-08-24 DIAGNOSIS — Z87891 Personal history of nicotine dependence: Secondary | ICD-10-CM | POA: Insufficient documentation

## 2013-08-24 DIAGNOSIS — F1410 Cocaine abuse, uncomplicated: Secondary | ICD-10-CM | POA: Insufficient documentation

## 2013-08-24 LAB — CBC
Hemoglobin: 15 g/dL (ref 13.0–17.0)
MCH: 31 pg (ref 26.0–34.0)
Platelets: 208 10*3/uL (ref 150–400)
RBC: 4.84 MIL/uL (ref 4.22–5.81)
WBC: 11.4 10*3/uL — ABNORMAL HIGH (ref 4.0–10.5)

## 2013-08-24 LAB — BASIC METABOLIC PANEL
Calcium: 9.9 mg/dL (ref 8.4–10.5)
GFR calc Af Amer: >90 mL/min (ref >90)
GFR calc non Af Amer: >90 mL/min (ref >90)
Potassium: 3.9 mEq/L (ref 3.5–5.1)
Sodium: 133 mEq/L — ABNORMAL LOW (ref 135–145)

## 2013-08-24 LAB — POCT I-STAT TROPONIN I: Troponin i, poc: 0 ng/mL (ref 0.00–0.08)

## 2013-08-24 MED ORDER — ALBUTEROL SULFATE HFA 108 (90 BASE) MCG/ACT IN AERS: 2.0000 | INHALATION_SPRAY | RESPIRATORY_TRACT | Status: DC | PRN

## 2013-08-24 MED ORDER — PREDNISONE 20 MG PO TABS: 40.0000 mg | ORAL_TABLET | Freq: Every day | ORAL | Status: DC

## 2013-08-24 MED ORDER — PREDNISONE 20 MG PO TABS
40.0000 mg | ORAL_TABLET | Freq: Once | ORAL | Status: AC
  Administered 2013-08-24: 40 mg via ORAL
  Filled 2013-08-24: qty 2

## 2013-08-24 MED ORDER — OXYCODONE-ACETAMINOPHEN 5-325 MG PO TABS
2.0000 | ORAL_TABLET | Freq: Once | ORAL | Status: AC
  Administered 2013-08-24: 2 via ORAL
  Filled 2013-08-24: qty 2

## 2013-08-24 MED ORDER — ALBUTEROL SULFATE (5 MG/ML) 0.5% IN NEBU
5.0000 mg | INHALATION_SOLUTION | Freq: Once | RESPIRATORY_TRACT | Status: AC
  Administered 2013-08-24: 5 mg via RESPIRATORY_TRACT
  Filled 2013-08-24: qty 1

## 2013-08-24 MED ORDER — AZITHROMYCIN 250 MG PO TABS: 250.0000 mg | ORAL_TABLET | Freq: Every day | ORAL | Status: DC

## 2013-08-24 MED ORDER — AZITHROMYCIN 250 MG PO TABS
500.0000 mg | ORAL_TABLET | Freq: Once | ORAL | Status: AC
  Administered 2013-08-24: 500 mg via ORAL
  Filled 2013-08-24: qty 2

## 2013-08-24 NOTE — ED Notes (Signed)
Patient reports having a CT scan at the outpatient clinic for his back, leg and groin pain to rule out kidney stones.  CT was negative.

## 2013-08-24 NOTE — ED Notes (Signed)
Per pt sts SOB that started yesterday afternoon. sts left hip, leg, groin pain. Pt speaking in short sentences. sts dry cough. sts took inhaler without relief.

## 2013-08-24 NOTE — Telephone Encounter (Signed)
Pt came by clinic after leaving ER for breathing. Discuss 2 referrals - [facility] has referrals - waiting for response. Left message for Lela. Stanton Kidney Audy Dauphine RN 08/24/13 2:30PM

## 2013-08-24 NOTE — ED Provider Notes (Signed)
CSN: [id number]     Arrival date & time 08/24/13  1156 History   First MD Initiated Contact with Patient 08/24/13 1209     Chief Complaint  Patient presents with  . Shortness of Breath   (Consider location/radiation/quality/duration/timing/severity/associated sxs/prior Treatment) HPI Comments: 57 year old male with a history of COPD as well as a history of more than 1 month for the right lower back, groin and leg pain. He states that this other pain has been chronic, he has been evaluated by his family doctor's with CAT scan to rule out kidney stone and has been referred to Gen. surgery to evaluate for hernia. He denies any masses in his scrotum at this time though he does note that sometimes he feels some testicular swelling, not at this time. His shortness of breath started yesterday, is persistent, gradually worsening, not associated with fevers or coughing. Nothing seems to make this better or worse  Patient is a 57 y.o. male presenting with shortness of breath. The history is provided by the patient.  Shortness of Breath   Past Medical History  Diagnosis Date  . Hyperlipidemia   . Hypertension   . GERD (gastroesophageal reflux disease)   . Tobacco abuse   . COPD (chronic obstructive pulmonary disease)     bronchitis  . Disorder of vocal cord   . Cocaine dependence   . Right shoulder pain     2/2 partial rotator cuff tear, tendinopathy, mild subacromial/subdeltioid bursitis, A/C joint arthropathy per MRI done in 3/07   Past Surgical History  Procedure Laterality Date  . Neck surgery     Family History  Problem Relation Age of Onset  . Coronary artery disease      Male 1st degree relatives<60  . Diabetes      1 st degree relatives   History  Substance Use Topics  . Smoking status: Former Smoker -- 1.00 packs/day    Types: Cigarettes  . Smokeless tobacco: Not on file     Comment: Wants to try Chantix32.26  . Alcohol Use: No    Review of Systems  Respiratory:  Positive for shortness of breath.   All other systems reviewed and are negative.    Allergies  Review of patient's allergies indicates no known allergies.  Home Medications   Current Outpatient Rx  Name  Route  Sig  Dispense  Refill  . albuterol (PROVENTIL HFA;VENTOLIN HFA) 108 (90 BASE) MCG/ACT inhaler   Inhalation   Inhale 2 puffs into the lungs every 6 (six) hours as needed for wheezing.   1 Inhaler   3   . aspirin EC 81 MG tablet   Oral   Take 243 mg by mouth daily.         . benazepril (LOTENSIN) 40 MG tablet   Oral   Take 40 mg by mouth daily.         Marland Kitchen esomeprazole (NEXIUM 24HR) 20 MG capsule   Oral   Take 20 mg by mouth daily before breakfast.         . Fluticasone-Salmeterol (ADVAIR) 500-50 MCG/DOSE AEPB   Inhalation   Inhale 1 puff into the lungs every 12 (twelve) hours.         . hydrochlorothiazide (HYDRODIURIL) 25 MG tablet   Oral   Take 25 mg by mouth daily.         . nitroGLYCERIN (NITROSTAT) 0.4 MG SL tablet   Sublingual   Place 1 tablet (0.4 mg total) under the tongue every 5 (  five) minutes as needed for chest pain.   25 tablet   8   . OVER THE COUNTER MEDICATION   Oral   Take 3 capsules by mouth daily. [employer] triple strength fish oil + krill  1500mg  fish oil + 300mg  krill oil / 2 capsules         . oxyCODONE-acetaminophen (PERCOCET) 10-325 MG per tablet   Oral   Take 1 tablet by mouth every 6 (six) hours as needed for pain.   120 tablet   0   . albuterol (PROVENTIL HFA;VENTOLIN HFA) 108 (90 BASE) MCG/ACT inhaler   Inhalation   Inhale 2 puffs into the lungs every 4 (four) hours as needed for wheezing or shortness of breath.   1 Inhaler   3   . azithromycin (ZITHROMAX Z-PAK) 250 MG tablet   Oral   Take 1 tablet (250 mg total) by mouth daily. 500mg  PO day 1, then 250mg  PO days 205   6 tablet   0   . predniSONE (DELTASONE) 20 MG tablet   Oral   Take 2 tablets (40 mg total) by mouth daily.   10 tablet   0    BP 103/66   Pulse 97  Temp(Src) 98 F (36.7 C)  Resp 16  SpO2 95% Physical Exam  Nursing note and vitals reviewed. Constitutional: He appears well-developed and well-nourished. No distress.  HENT:  Head: Normocephalic and atraumatic.  Mouth/Throat: Oropharynx is clear and moist. No oropharyngeal exudate.  Eyes: Conjunctivae and EOM are normal. Pupils are equal, round, and reactive to light. Right eye exhibits no discharge. Left eye exhibits no discharge. No scleral icterus.  Neck: Normal range of motion. Neck supple. No JVD present. No thyromegaly present.  Cardiovascular: Normal rate, regular rhythm, normal heart sounds and intact distal pulses.  Exam reveals no gallop and no friction rub.   No murmur heard. Pulmonary/Chest: Effort normal. No respiratory distress. He has wheezes ( Mild tachypnea, speaks in full sentences, forced expiratory wheezing with prolonged expiratory phase). He has no rales.  Abdominal: Soft. Bowel sounds are normal. He exhibits no distension and no mass. There is no tenderness.  Musculoskeletal: Normal range of motion. He exhibits no edema and no tenderness.  Lymphadenopathy:    He has no cervical adenopathy.  Neurological: He is alert. Coordination normal.  Skin: Skin is warm and dry. No rash noted. No erythema.  Psychiatric: He has a normal mood and affect. His behavior is normal.    ED Course  Procedures (including critical care time) Labs Review Labs Reviewed  BASIC METABOLIC PANEL - Abnormal; Notable for the following:    Sodium 133 (*)    Chloride 95 (*)    Glucose, Bld 116 (*)    All other components within normal limits  CBC - Abnormal; Notable for the following:    WBC 11.4 (*)    All other components within normal limits  POCT I-STAT TROPONIN I   Imaging Review Dg Chest 2 View (if Patient Has Fever And/or Copd)  08/24/2013   CLINICAL DATA:  Shortness of breath. Posterior low back pain.  EXAM: CHEST  2 VIEW  COMPARISON:  CT chest 09/29/2012.  FINDINGS:  The heart size is within normal limits. There is some hyperexpansion of the lungs. The 4 mm pulmonary nodules previously seen are not evident on the study. No focal airspace consolidation is present. There is no edema or effusion.  IMPRESSION: 1. No acute cardiopulmonary disease. 2. Emphysematous changes.   Electronically Signed  By: Gennette Pac M.D.   On: 08/24/2013 12:55    MDM   1. COPD exacerbation    Patient has a chronic pain on his right side going toes scrotum. He has some difficulty sitting up because it causes increased pain in his right flank. He is wheezing on exam and this would be consistent with COPD. There is no fever, his tachycardia is resolving, he is currently receiving a nebulized treatment, chest x-ray and we'll give prednisone. He can finish the workup for his back and groin pain with his family doctor and Development worker, international aid.  Pt has improved - 95% after ambulation - will be d/c with meds as below - xray without acute infection.  Filed Vitals:   08/24/13 1204 08/24/13 1215 08/24/13 1300 08/24/13 1349  BP: 115/84 119/87 108/84 103/66  Pulse: 114 96 102 97  Temp: 98 F (36.7 C)     Resp: 24 17 25 16   SpO2: 98% 97% 95% 95%     Meds given in ED:  Medications  albuterol (PROVENTIL) (5 MG/ML) 0.5% nebulizer solution 5 mg (5 mg Nebulization Given 08/24/13 1218)  oxyCODONE-acetaminophen (PERCOCET/ROXICET) 5-325 MG per tablet 2 tablet (2 tablets Oral Given 08/24/13 1248)  predniSONE (DELTASONE) tablet 40 mg (40 mg Oral Given 08/24/13 1248)  azithromycin (ZITHROMAX) tablet 500 mg (500 mg Oral Given 08/24/13 1349)    New Prescriptions   ALBUTEROL (PROVENTIL HFA;VENTOLIN HFA) 108 (90 BASE) MCG/ACT INHALER    Inhale 2 puffs into the lungs every 4 (four) hours as needed for wheezing or shortness of breath.   AZITHROMYCIN (ZITHROMAX Z-PAK) 250 MG TABLET    Take 1 tablet (250 mg total) by mouth daily. 500mg  PO day 1, then 250mg  PO days 205   PREDNISONE (DELTASONE) 20 MG TABLET     Take 2 tablets (40 mg total) by mouth daily.      Vida Roller, MD 08/24/13 [phone]

## 2013-08-30 ENCOUNTER — Encounter: Payer: PRIVATE HEALTH INSURANCE | Admitting: Internal Medicine

## 2013-08-31 ENCOUNTER — Ambulatory Visit (HOSPITAL_COMMUNITY)
Admission: RE | Admit: 2013-08-31 | Discharge: 2013-08-31 | Disposition: A | Discharge status: Home / Self Care | Payer: No Typology Code available for payment source | Source: Ambulatory Visit | Attending: Internal Medicine | Admitting: Internal Medicine

## 2013-08-31 ENCOUNTER — Ambulatory Visit (INDEPENDENT_AMBULATORY_CARE_PROVIDER_SITE_OTHER): Payer: No Typology Code available for payment source | Admitting: Internal Medicine

## 2013-08-31 ENCOUNTER — Encounter: Payer: Self-pay | Admitting: Internal Medicine

## 2013-08-31 VITALS — BP 140/89 | HR 63 | Temp 97.5°F | Ht 71.0 in | Wt 221.8 lb

## 2013-08-31 DIAGNOSIS — M545 Low back pain, unspecified: Secondary | ICD-10-CM

## 2013-08-31 DIAGNOSIS — J4489 Other specified chronic obstructive pulmonary disease: Secondary | ICD-10-CM

## 2013-08-31 DIAGNOSIS — R109 Unspecified abdominal pain: Secondary | ICD-10-CM | POA: Insufficient documentation

## 2013-08-31 DIAGNOSIS — R319 Hematuria, unspecified: Secondary | ICD-10-CM

## 2013-08-31 DIAGNOSIS — J449 Chronic obstructive pulmonary disease, unspecified: Secondary | ICD-10-CM

## 2013-08-31 DIAGNOSIS — M533 Sacrococcygeal disorders, not elsewhere classified: Secondary | ICD-10-CM

## 2013-08-31 DIAGNOSIS — R1031 Right lower quadrant pain: Secondary | ICD-10-CM

## 2013-08-31 DIAGNOSIS — F0631 Mood disorder due to known physiological condition with depressive features: Secondary | ICD-10-CM

## 2013-08-31 DIAGNOSIS — F329 Major depressive disorder, single episode, unspecified: Secondary | ICD-10-CM

## 2013-08-31 MED ORDER — GABAPENTIN 300 MG PO CAPS: 300.0000 mg | ORAL_CAPSULE | Freq: Every day | ORAL | Status: DC

## 2013-08-31 MED ORDER — FLUOXETINE HCL 10 MG PO CAPS: 10.0000 mg | ORAL_CAPSULE | Freq: Every day | ORAL | Status: DC

## 2013-08-31 NOTE — Assessment & Plan Note (Signed)
Completed steroid burst from ED and is currently back to baseline. 1. No further treatment, management, evaluation necessary at this time.

## 2013-08-31 NOTE — Assessment & Plan Note (Addendum)
Currently being evaluated. Pt has appt w/ urologist at [facility] on 10/15. Reminded pt of appointment. Appreciate urology recs.  Update by Dr. Criselda Peaches - -   Urology appointment scheduled and pending.

## 2013-08-31 NOTE — Assessment & Plan Note (Addendum)
Reviewed CT w/ Dr. Criselda Peaches. No official read regarding hips, however, joint space appears narrowed. Possible osteoarthritis may be responsible for some of the pain pt is experiencing. Hip OA would not explain testicular pain. Additionally, pain could be neuropathic or myofascial. 1. Hip X ray today to evaluate for OA. 2. Start trial of Gabapentin 300 mg PO qhs to see if this helps pt w/ pain.  Update by Dr. Criselda Peaches - -   Agree with above.  Groin pain has not been fully elucidated.  Will attempt trial of gabapentin for possible nerve component and check hip xrays.  Further workup based on xrays and response to gabapentin.

## 2013-08-31 NOTE — Progress Notes (Signed)
  Subjective:    Patient ID: Bradley Sawyer, male    DOB: Jan 12, 1956, 57 y.o.   MRN: [id number]  CC: ED follow up  HPI  COPD exacerbation - - presented to the ED for this; currently feels he is at his baseline, no SOB outside of his normal.  No chest tightness, fever, chills, nausea, vomiting.  + mild cough with some white sputum, seems normal for him.  He had a CXR at the time which showed no acute disease.   Constipation due to narcotics.  Relieved by stool softeners.   Has a history of chronic pain, particularly groin/back pain for which he has been referred to general surgery.  He has not seen a provider yet.  He has seen his back surgeon a few weeks ago who recommended an MRI, however, he has deferred this study due to the cost.  Has some low mood due to not being able to do what he used to do like work on cars, etc. Due to the pain.  Sometimes will get emotional and feel that it is hard to breathe.  Feels like they are anxiety like attacks.  No frank anhedonia.  Most symptoms seem related to his groin pain and change in financial status.  He does not want to be on a long term medication.  He has quit smoking.   Review of Systems  Constitutional: Negative for fever, chills and fatigue.  Respiratory: Positive for cough (chronic). Negative for shortness of breath and wheezing.   Cardiovascular: Negative for chest pain and leg swelling.  Gastrointestinal: Positive for constipation (improved with stool softeners). Negative for abdominal pain and diarrhea.  Genitourinary: Positive for testicular pain (with groin pain). Negative for hematuria, discharge, difficulty urinating and genital sores.  Musculoskeletal: Positive for arthralgias and back pain.       Groin pain with straining, coughing, sneezing, occasionally with walking  Skin: Negative for rash and wound.  Neurological: Negative for dizziness and light-headedness.  Psychiatric/Behavioral: Positive for dysphoric mood. Negative for  suicidal ideas. The patient is nervous/anxious.        Objective:   Physical Exam  Constitutional: He is oriented to person, place, and time. He appears well-developed and well-nourished. No distress.  HENT:  Head: Normocephalic and atraumatic.  Eyes: Conjunctivae are normal. No scleral icterus.  Cardiovascular: Normal rate, regular rhythm and normal heart sounds.   No murmur heard. Pulmonary/Chest: Breath sounds normal. No respiratory distress. He has no wheezes.  Abdominal: Soft. Bowel sounds are normal. He exhibits no distension. There is no tenderness.  Musculoskeletal:  Hip ROM was normal, no pain with IR/ER of the hip bilaterally.  Normal SLR's bilaterally.  No pain with palpation of the groin, no inguinal LAD.   Neurological: He is alert and oriented to person, place, and time. He exhibits normal muscle tone.  Skin: Skin is warm and dry. No rash noted. No pallor.  Psychiatric: He has a normal mood and affect. His behavior is normal.        Assessment & Plan:  RTC prn, will call him with results of studies.

## 2013-08-31 NOTE — Progress Notes (Signed)
Subjective:     Patient ID: Bradley Sawyer, male   DOB: 12/14/1955, 57 y.o.   MRN: [id number]  HPI This is a 57 yo M w/ hx of COPD and recurring pain from a recent accident here today for ED f/u visit for COPD exacerbation, with additional concerns of his pain and anxiety.  COPD Pt reports he is back to baseline. No increased SOB, gasping for air, chest pain/tightness. Endorses a cough that is unchanged from usual that produces white sputum with occasional dark brown spots. He denies fevers, chills, and peripheral swelling.  Pain Began about 1 wk after an accident where he had a riding lawnmower land on his back and compressed 2 of his vertebrae. Pain is described as occurring at back, hip, groin, and testicles. Except for the back pain, pt states that when pain occurs he cannot find a spot that is specifically tender. Furthermore, he experiences different areas and types of pain at different times. He describes his testicular pain as a sharp stabbing. Different pains are exacerbated by different movements such as walking, sitting for long periods, going to the bathroom, passing gas, bending over, and reaching for objects with a forward motion. He denies ever having a rash or hx of shingles. States that he takes his pain medication before he gets up in the morning and needs it in order to get through the day, which he does not enjoy. He has had prior CT scan that was negative per pt report.   Anxiety Pt has developed "anxiety" since his pain started post-accident. He denies specific triggers but describes the anxiety as occurring when "little things go wrong," such as his car not working and then he will then begin to think about his pain, bills and the various lifestyle changes that occurred since the accident. For example, he went from living in his own home to his son's RV, he only owns one car now when he used to have 3, he is no longer working due to his pain, etc. He states that he will have "2-3  good weeks and then 3 or 4 bad days." When he experiences anxiety he feels overwhelmed, gets "emotional" with periods of crying, and sometimes has difficulty breathing. During these episodes he denies feelings of doom, or chest pain/palpitations. He endorses feeling sad or down at times and denies suicidal or homicidal ideation. He states that he does not do the activities he normally enjoys, such as fixing cars and appliances, however, this appears to be more due to physical limitations due to pain. Pt still enjoys spending time with friends. He further explains that his "needs are met" and is receiving help from family financially and to "keep from being homeless" but describes feelings of loneliness and wonders at times "is this as good as it is going to get."  Pt has not used tobacco products since July 1st of this year. No alcohol use. No illicit drug use since 1998. Pt lives by himself in his son's RV.  Review of Systems  Gastrointestinal: Positive for constipation. Negative for blood in stool.       Some constipation related to narcotic use for pain. Relieved using OTC stool softeners  Genitourinary: Negative for dysuria, frequency, hematuria and difficulty urinating.       Has been told he has blood in urine but has not noted red or tea colored urine  Rest of ROS as per HPI    Objective:   Physical Exam  Constitutional: He is  oriented to person, place, and time. No distress.  HENT:  Head: Normocephalic and atraumatic.  Eyes: Conjunctivae and EOM are normal. Pupils are equal, round, and reactive to light. No scleral icterus.  Neck: Normal range of motion.  Surgical scar on right anterior of neck  Cardiovascular: Normal rate, regular rhythm, normal heart sounds and intact distal pulses.  Exam reveals no gallop and no friction rub.   No murmur heard. 2+ radial pulses bilaterally  Pulmonary/Chest: Effort normal and breath sounds normal. No respiratory distress. He has no wheezes. He has no  rales.  Musculoskeletal: Normal range of motion. He exhibits no edema and no tenderness.  Able to stand and ambulate about room. Unable to stay sitting for extended period of time during visit due to back pain. Went to stand once during visit due to this issue. Able to ambulate freely without difficulty. No tenderness to palpation of pt's back. Lumbar vertebrae intact w/o bony tenderness or crepitus.   Neurological: He is alert and oriented to person, place, and time.  Speaks in full intelligible sentences and answers questions appropriately.  Skin: Skin is warm and dry. No rash noted. No erythema.  Approx 3 cm in dia firm round mass on left hand pt states has been there since the 90s after trauma from a bolt while he was working on car. Mass non tender to palpation. Non erythematous. Not warm to touch.  Psychiatric: He has a normal mood and affect. His behavior is normal. Judgment and thought content normal.  For abdominal exam and more detailed MSK exam, please see Dr. Donnetta Hutching note.  Filed Vitals:   08/31/13 0950  BP: 140/89  Pulse: 63  Temp: 97.5 F (36.4 C)  TempSrc: Oral  Height: 5\' 11"  (1.803 m)  Weight: 221 lb 12.8 oz (100.608 kg)  SpO2: 96%      Assessment:     This is a 57 yo M w/ hx of COPD and recurring pain who is experiencing anxiety, but is otherwise back to baseline since ED visit for COPD exacerbation, being further evaluated for pain and hematuria as an outpatient.   For full assessment, please see problem oriented charting    Plan:     For plan, please see problem oriented charting

## 2013-08-31 NOTE — Assessment & Plan Note (Addendum)
Pt at risk of depression. Has had life changing event and PHQ-2 positive for feelings of sadness, negative for true anhedonia. No suicidal or homicidal ideation. Does not at this time meet definition for major depressive syndrome or episode where manifested symptoms are "present most of the day nearly every day for a minimum of two consecutive weeks." Pt does not meet DSM-V criteria for GAD. Much of pt's anxiety revolves around his pain, how it has affected his life, and lack of answers for what is causing it.  1. Continue monitoring for major depressive symptoms 2. Start Prozac 10 mg PO qday to help with anxiety while being evaluated for pain. Currently no intent to keep pt on this medication chronically.  Update - -   Agree with plan by medical student.

## 2013-08-31 NOTE — Patient Instructions (Signed)
It was a pleasure seeing you in clinic today! We are glad that your COPD is improved from your ED visit.  Today we are sending you to get a hip X-ray to see if you have arthritis in your hip.  We are starting you on Gabapentin 300 mg to be taken by mouth once a day at bedtime. If you are indeed having nerve pain, this medication should help. For your anxiety we are starting you on a small dose of Prozac 10 mg to be taken by mouth once a day.  Your Urologist appointment at [facility] is on 10/15 (Wednesday) @ 2:15 pm. This is to evaluate the blood that was found in your urine.

## 2013-09-03 ENCOUNTER — Telehealth: Payer: Self-pay | Admitting: *Deleted

## 2013-09-03 DIAGNOSIS — R1031 Right lower quadrant pain: Secondary | ICD-10-CM

## 2013-09-03 NOTE — Telephone Encounter (Signed)
Pt calls and leaves a message for dr Criselda Peaches stating that she had promised to call him w/ results at first of week, would like for her to call at [phone]

## 2013-09-03 NOTE — Assessment & Plan Note (Signed)
This has been the most chronic issue for Bradley Sawyer.  We have tried to get him in to see General Surgery, but his appointment is delayed due to insurance status.  He does not have an obvious hernia (from previous exams) and his hip ROM is normal.  Will attempt trial of gabapentin for possible nerve pain and check hip xrays for completion.

## 2013-09-03 NOTE — Telephone Encounter (Signed)
Just saw this, will return call first thing in the AM.  Thank you - -

## 2013-09-04 MED ORDER — OXYCODONE-ACETAMINOPHEN 10-325 MG PO TABS: 1.0000 | ORAL_TABLET | Freq: Four times a day (QID) | ORAL | Status: DC | PRN

## 2013-09-04 MED ORDER — GABAPENTIN 300 MG PO CAPS: 300.0000 mg | ORAL_CAPSULE | Freq: Two times a day (BID) | ORAL | Status: DC

## 2013-09-04 NOTE — Telephone Encounter (Signed)
Called and spoke with Bradley Sawyer regarding his Xray results.  We discussed the finding of moderate arthritis in the hip, but that this would likely not completely explain his pain.   He is taking the gabapentin at night and he is feeling some difference in the pain.  He is still taking narcotic pain medication.  Since starting the gabapentin, his pain medication is working better and he has had some times when the pain was completely gone.  The pain is somewhat worsened, however, today.  He does think the gabapentin may be working somewhat.    He only has about 30 pain pills left, he is taking 4 pills per day.  He is currently taking half pills every 3-4 hours.  Oxycodone/acetomenophin 10/325's, #120.   Last filled 9/24.   Plan - -   Change gabapentin to 300mg  BID  Increase Percocet to 10/325 #150  He will pick both up on Friday.

## 2013-09-19 ENCOUNTER — Telehealth: Payer: Self-pay | Admitting: *Deleted

## 2013-09-19 NOTE — Telephone Encounter (Signed)
Call from pt - states Percocet 10/325 is not controlling his pain; wants to know if there's anything else he can take. I called and talked to pt to see if he's also taking Gabapentin - states the combination is working but not lasting the complete 6 hourThanks

## 2013-09-21 ENCOUNTER — Ambulatory Visit (INDEPENDENT_AMBULATORY_CARE_PROVIDER_SITE_OTHER): Payer: No Typology Code available for payment source | Admitting: Internal Medicine

## 2013-09-21 ENCOUNTER — Encounter: Payer: Self-pay | Admitting: Internal Medicine

## 2013-09-21 VITALS — BP 149/78 | HR 72 | Temp 97.9°F | Ht 71.0 in | Wt 216.5 lb

## 2013-09-21 DIAGNOSIS — M545 Low back pain, unspecified: Secondary | ICD-10-CM

## 2013-09-21 DIAGNOSIS — R1031 Right lower quadrant pain: Secondary | ICD-10-CM

## 2013-09-21 MED ORDER — MELOXICAM 15 MG PO TABS: 15.0000 mg | ORAL_TABLET | Freq: Every day | ORAL | Status: DC

## 2013-09-21 NOTE — Assessment & Plan Note (Signed)
The patient notes a two-month history of right lower back pain, radiating to his right groin and right testicle. The patient has been seen here several times for evaluation and treatment of this, most recently with oxycodone medication increased 2 weeks ago. Given the fact that the character of the pain has not changed, I believe his current pain is due to his compression fracture.  Unlikely items on the differential include nephrolithiasis (CT negative for this last month) vs epididymitis (history of the same, but pain not acute, and only occurs in association with back pain) vs hernia (pain is constant, radiates from back, not positional/situational). -continue gabapentin and oxycodone-acetaminophen as prescribed -will start a trial of Meloxicam, 15 mg daily -pt to f/u with [facility] as scheduled in 2 weeks for further work-up

## 2013-09-21 NOTE — Telephone Encounter (Signed)
Pt has an apt today to see Dr Manson Passey.

## 2013-09-21 NOTE — Progress Notes (Signed)
HPI The patient is a 57 y.o. male with a history of right back/groin pain since a vertebral fracture, presenting for a follow-up of right back/groin pain.  The patient notes a 45-month history of right lower back pain, radiating to his right groin and right testicle.  At symptom onset, he was found to have a vertebral compression fracture, s/p a riding lawnmower incident, with L2 compression fracture seen on CT abd/pelvis 9/15.  The patient presents today because his pain is not well controlled with his pain medications, only improving from a 10/10 to a 3/10 after medication.  The patient is on oxycodone-acetaminophen 10-325, with quantity recently increased to 150 tabs/month (filled 10/17), and gabapentin 300 mg, recently increased to BID.  The patient notes no new pain, and no change in the character of the pain, but notes that this chronic pain is just not relieved by the medications.  The patient has a history of hematuria, but CT abdomen showed no kidney stones.  No dysuria, fevers.  The patient has appts at [facility] 11/14, with CT abd with contrast and cystoscopy scheduled for that time.  ROS: General: no fevers, chills, changes in weight, changes in appetite Skin: no rash HEENT: no blurry vision, hearing changes, sore throat Pulm: no dyspnea, coughing, wheezing CV: no chest pain, palpitations, shortness of breath Abd: no abdominal pain, nausea/vomiting, diarrhea/constipation GU: no dysuria, hematuria, polyuria Ext: no arthralgias, myalgias Neuro: no weakness, numbness, or tingling  Filed Vitals:   09/21/13 0949  BP: 149/78  Pulse: 72  Temp: 97.9 F (36.6 C)    PEX General: alert, cooperative, and in no apparent distress HEENT: pupils equal round and reactive to light, vision grossly intact, oropharynx clear and non-erythematous  Neck: supple Lungs: clear to ascultation bilaterally, normal work of respiration, no wheezes, rales, ronchi Heart: regular rate and rhythm, no murmurs,  gallops, or rubs Abdomen: soft, non-tender, non-distended, normal bowel sounds Back: ttp of lumbar spine.  No ttp or spasm of paraspinal muscles.  No CVA ttp on R or L. Extremities: no cyanosis, clubbing, or edema Neurologic: alert & oriented X3, cranial nerves II-XII intact, strength grossly intact, sensation intact to light touch  Current Outpatient Prescriptions on File Prior to Visit  Medication Sig Dispense Refill  . albuterol (PROVENTIL HFA;VENTOLIN HFA) 108 (90 BASE) MCG/ACT inhaler Inhale 2 puffs into the lungs every 6 (six) hours as needed for wheezing.  1 Inhaler  3  . albuterol (PROVENTIL HFA;VENTOLIN HFA) 108 (90 BASE) MCG/ACT inhaler Inhale 2 puffs into the lungs every 4 (four) hours as needed for wheezing or shortness of breath.  1 Inhaler  3  . aspirin EC 81 MG tablet Take 243 mg by mouth daily.      Marland Kitchen azithromycin (ZITHROMAX Z-PAK) 250 MG tablet Take 1 tablet (250 mg total) by mouth daily. 500mg  PO day 1, then 250mg  PO days 205  6 tablet  0  . benazepril (LOTENSIN) 40 MG tablet Take 40 mg by mouth daily.      Marland Kitchen esomeprazole (NEXIUM 24HR) 20 MG capsule Take 20 mg by mouth daily before breakfast.      . FLUoxetine (PROZAC) 10 MG capsule Take 1 capsule (10 mg total) by mouth daily.  30 capsule  2  . Fluticasone-Salmeterol (ADVAIR) 500-50 MCG/DOSE AEPB Inhale 1 puff into the lungs every 12 (twelve) hours.      . gabapentin (NEURONTIN) 300 MG capsule Take 1 capsule (300 mg total) by mouth 2 (two) times daily.  60 capsule  2  . hydrochlorothiazide (HYDRODIURIL) 25 MG tablet Take 25 mg by mouth daily.      . nitroGLYCERIN (NITROSTAT) 0.4 MG SL tablet Place 1 tablet (0.4 mg total) under the tongue every 5 (five) minutes as needed for chest pain.  25 tablet  8  . OVER THE COUNTER MEDICATION Take 3 capsules by mouth daily. [employer] triple strength fish oil + krill  1500mg  fish oil + 300mg  krill oil / 2 capsules      . oxyCODONE-acetaminophen (PERCOCET) 10-325 MG per tablet Take 1 tablet by  mouth every 6 (six) hours as needed for pain. Can be filled on 10/17.  150 tablet  0  . predniSONE (DELTASONE) 20 MG tablet Take 2 tablets (40 mg total) by mouth daily.  10 tablet  0   No current facility-administered medications on file prior to visit.    Assessment/Plan

## 2013-09-21 NOTE — Patient Instructions (Signed)
General Instructions: For your back pain: -we are adding Meloxicam.  Take 1 tablet, once per day for back pain. -continue to take your Oxycodone and Gabapentin medications -we are checking a urine sample today  Please return for a follow-up visit in 2-3 weeks.   Treatment Goals:  Goals (1 Years of Data) as of 09/21/13   None      Progress Toward Treatment Goals:  Treatment Goal 09/21/2013  Blood pressure at goal    Self Care Goals & Plans:  Self Care Goal 09/21/2013  Manage my medications take my medicines as prescribed; bring my medications to every visit  Eat healthy foods -  Be physically active -  Stop smoking cut down the number of cigarettes smoked    No flowsheet data found.   Care Management & Community Referrals:  Referral 09/21/2013  Referrals made for care management support none needed      Vertebral Fracture You have a fracture of one or more vertebra. These are the bony parts that form the spine. Minor vertebral fractures happen when people fall. Osteoporosis is associated with many of these fractures. Hospital care may not be necessary for minor compression fractures that are stable. However, multiple fractures of the spine or unstable injuries can cause severe pain and even damage the spinal cord. A spinal cord injury may cause paralysis, numbness, or loss of normal bowel and bladder control.  Normally there is pain and stiffness in the back for 3 to 6 weeks after a vertebral fracture. Bed rest for several days, pain medicine, and a slow return to activity is often the only treatment that is needed depending on the location of the fracture. Neck and back braces may be helpful in reducing pain and increasing mobility. When your pain allows, you should begin walking or swimming to help maintain your endurance. Exercises to improve motion and to strengthen the back may also be useful after the initial pain improves. Treatment for osteoporosis may be essential  for full recovery. This will help reduce your risk of vertebral fractures with a future fall. During the first few days after a spine fracture you may feel nauseated or vomit. If this is severe, hospital care with IV fluids will be needed.  Arrange for follow-up care as recommended to assure proper long-term care and prevention of further spine injury.  SEEK IMMEDIATE MEDICAL CARE IF:  You have increasing pain, vomiting, or are unable to move around at all.  You develop numbness, tingling, weakness, or paralysis of any part of your body.  You develop a loss of normal bowel or bladder control.  You have difficulty breathing, cough, fever, chest or abdominal pain. MAKE SURE YOU:   Understand these instructions.  Will watch your condition.  Will get help right away if you are not doing well or get worse. Document Released: 12/16/2004 Document Revised: 01/31/2012 Document Reviewed: 07/01/2009 [facility] Patient Information 2014 [place], [place].

## 2013-09-22 LAB — URINALYSIS, ROUTINE W REFLEX MICROSCOPIC
Bilirubin Urine: NEGATIVE
Glucose, UA: NEGATIVE mg/dL
Nitrite: NEGATIVE
Protein, ur: NEGATIVE mg/dL
Urobilinogen, UA: 0.2 mg/dL (ref 0.0–1.0)

## 2013-09-22 LAB — URINALYSIS, MICROSCOPIC ONLY: Squamous Epithelial / LPF: NONE SEEN

## 2013-09-24 NOTE — Progress Notes (Signed)
Case discussed with Dr. Brown at the time of the visit.  We reviewed the resident's history and exam and pertinent patient test results.  I agree with the assessment, diagnosis, and plan of care documented in the resident's note. 

## 2013-10-02 ENCOUNTER — Encounter: Payer: Self-pay | Admitting: Internal Medicine

## 2013-10-03 ENCOUNTER — Other Ambulatory Visit: Payer: Medicaid Other

## 2013-10-03 DIAGNOSIS — Z1211 Encounter for screening for malignant neoplasm of colon: Secondary | ICD-10-CM

## 2013-10-03 LAB — POC HEMOCCULT BLD/STL (HOME/3-CARD/SCREEN)
Card #3 Fecal Occult Blood, POC: NEGATIVE
Fecal Occult Blood, POC: NEGATIVE

## 2013-10-05 ENCOUNTER — Ambulatory Visit (INDEPENDENT_AMBULATORY_CARE_PROVIDER_SITE_OTHER): Payer: No Typology Code available for payment source | Admitting: Internal Medicine

## 2013-10-05 ENCOUNTER — Encounter: Payer: Self-pay | Admitting: Internal Medicine

## 2013-10-05 ENCOUNTER — Telehealth: Payer: Self-pay | Admitting: *Deleted

## 2013-10-05 VITALS — BP 158/88 | HR 74 | Temp 98.4°F | Ht 71.0 in

## 2013-10-05 DIAGNOSIS — S32020S Wedge compression fracture of second lumbar vertebra, sequela: Secondary | ICD-10-CM

## 2013-10-05 DIAGNOSIS — X58XXXS Exposure to other specified factors, sequela: Secondary | ICD-10-CM

## 2013-10-05 DIAGNOSIS — M549 Dorsalgia, unspecified: Secondary | ICD-10-CM

## 2013-10-05 DIAGNOSIS — IMO0002 Reserved for concepts with insufficient information to code with codable children: Secondary | ICD-10-CM

## 2013-10-05 MED ORDER — OXYCODONE-ACETAMINOPHEN 7.5-325 MG PO TABS: 2.0000 | ORAL_TABLET | Freq: Four times a day (QID) | ORAL | Status: DC | PRN

## 2013-10-05 MED ORDER — OXYCODONE-ACETAMINOPHEN 7.5-300 MG PO TABS: 2.0000 | ORAL_TABLET | Freq: Four times a day (QID) | ORAL | Status: DC | PRN

## 2013-10-05 NOTE — Progress Notes (Deleted)
  Subjective:    Patient ID: Bradley Sawyer, male    DOB: 1956-09-26, 57 y.o.   MRN: [id number]  HPI  Bradley Sawyer is a 57 year old male with a history significant for compression fracture of L2 after lawnmower accident. He presents today after addition of Aloxi and 50 mg daily to his regimen of Percocet 10 4/325 every 6 hours when necessary. He states that meloxicam  has helped some but he feels like the Percocet no longer is as effective and is requesting a dose increase.  Of note he was [facility] by [facility] for hematuria with no abnormal findings on CT of the abdomen.  Review of Systems     Objective:   Physical Exam        Assessment & Plan:

## 2013-10-05 NOTE — Telephone Encounter (Signed)
review 

## 2013-10-05 NOTE — Progress Notes (Addendum)
  Subjective:    Patient ID: Bradley Sawyer, male    DOB: September 08, 1956, 57 y.o.   MRN: [id number]  HPI  History significant for compression fracture of L2 with back and right groin pain. Presents for f/u back pain after adding meloxicam 15 mg qd to regimen of Percocet 10/325 q6h prn.  States meloxicam has helped but feels as though Percocet no longer as effective. Of note patient was evaluated by Urology at [facility] for hematuria with CT of the abdomen. Report is unattainable 'Care Everywhere' but per patient report no abnormalities were found other than mild prostate hypertrophy.  Review of Systems  Constitutional: Negative for fever and fatigue.  HENT: Negative.   Respiratory: Negative for shortness of breath.   Cardiovascular: Negative for chest pain and leg swelling.  Gastrointestinal: Negative.   Genitourinary: Positive for testicular pain. Negative for hematuria.  Musculoskeletal: Positive for back pain.  Neurological: Negative for weakness and headaches.  Psychiatric/Behavioral: Negative.        Objective:   Physical Exam  Constitutional: He is oriented to person, place, and time. He appears well-developed and well-nourished. No distress.  HENT:  Head: Normocephalic and atraumatic.  Eyes: Conjunctivae are normal. Pupils are equal, round, and reactive to light.  Neck: Neck supple.  Cardiovascular: Normal rate and regular rhythm.   Pulmonary/Chest: Effort normal and breath sounds normal.  Abdominal: Soft. Bowel sounds are normal.  Musculoskeletal: He exhibits no edema.  Antalgic gait  Neurological: He is alert and oriented to person, place, and time.  Skin: Skin is warm and dry.  Psychiatric: He has a normal mood and affect. His behavior is normal. Judgment and thought content normal.          Assessment & Plan:  See separate problem list charting:  Back pain secondary to compression fracture L2: change oxycodone to 7.5/300 take 2 tabs q6 prn #180 -opioid  contract updated -no [facility] card slots available for 2014, consider Sports Medicine referral on f/u if uncreased dosage not effective -Consider pain clinic referral if slots available for orange card  Addendum: [facility] pharmacy did not have the 7.5/300 mg thus Prescription was changed to 7.5/325 mg take 2 tabs every 6 hour when necessary while awake #180

## 2013-10-05 NOTE — Patient Instructions (Addendum)
We have increased your pain medicine dosage to 7.5/300 mg every 6 hours as needed while away. If your pain is mild only take 1 tablet. Continue the meloxicam 15 mg daily. We will let you know when a slot becomes available to see the Orthopedist. Keep your scheduled appointment with your PCP this month.

## 2013-10-05 NOTE — Addendum Note (Signed)
Addended by: Maura Crandall on: 10/05/2013 02:24 PM   Modules accepted: Orders

## 2013-10-08 NOTE — Progress Notes (Signed)
Case discussed with Dr. Schooler at the time of the visit.  We reviewed the resident's history and exam and pertinent patient test results.  I agree with the assessment, diagnosis, and plan of care documented in the resident's note.     

## 2013-10-16 ENCOUNTER — Encounter: Payer: Self-pay | Admitting: Internal Medicine

## 2013-10-16 ENCOUNTER — Ambulatory Visit (INDEPENDENT_AMBULATORY_CARE_PROVIDER_SITE_OTHER): Payer: No Typology Code available for payment source | Admitting: Internal Medicine

## 2013-10-16 VITALS — BP 165/88 | HR 66 | Temp 98.4°F | Wt 221.6 lb

## 2013-10-16 DIAGNOSIS — M5416 Radiculopathy, lumbar region: Secondary | ICD-10-CM

## 2013-10-16 DIAGNOSIS — M545 Low back pain, unspecified: Secondary | ICD-10-CM

## 2013-10-16 DIAGNOSIS — I10 Essential (primary) hypertension: Secondary | ICD-10-CM

## 2013-10-16 DIAGNOSIS — X58XXXS Exposure to other specified factors, sequela: Secondary | ICD-10-CM

## 2013-10-16 DIAGNOSIS — IMO0002 Reserved for concepts with insufficient information to code with codable children: Secondary | ICD-10-CM

## 2013-10-16 DIAGNOSIS — S32020S Wedge compression fracture of second lumbar vertebra, sequela: Secondary | ICD-10-CM

## 2013-10-16 DIAGNOSIS — E785 Hyperlipidemia, unspecified: Secondary | ICD-10-CM

## 2013-10-16 MED ORDER — ATORVASTATIN CALCIUM 20 MG PO TABS: 20.0000 mg | ORAL_TABLET | Freq: Every day | ORAL | Status: DC

## 2013-10-16 MED ORDER — GABAPENTIN 100 MG PO CAPS: 400.0000 mg | ORAL_CAPSULE | Freq: Three times a day (TID) | ORAL | Status: DC

## 2013-10-16 MED ORDER — AMLODIPINE BESYLATE 5 MG PO TABS: 5.0000 mg | ORAL_TABLET | Freq: Every day | ORAL | Status: DC

## 2013-10-16 NOTE — Patient Instructions (Signed)
Please increase gabapentin to 400 mg three times a day.  We will talk about adding Cymbalta at the next visit.  I have referred you to pain management for potential evaluation with diagnostic block of likely neuropathic pain after lumbar spine trauma in Aug of 2014.  We discussed possibly getting an MRI, but will hold off at this time due to expense.  Today we restarted amlodipine for blood pressure control.

## 2013-10-16 NOTE — Progress Notes (Signed)
Subjective:   Patient ID: Bradley Sawyer male   DOB: 1955-12-21 57 y.o.   MRN: [id number]  HPI: [name] is a 57 y.o. man who returns for f/u regarding management of this low back pain. In august the patient was mowing his grass when his lawn mowing rolled over him as he tried to mow on an incline. He had acute pain and was seen in ED. CT demonstrated L2 compression fracture. He has had pain radiation from his right paraspinal region  (L1-L2 distribution) around his waist and down to his groin. The pain is sharp and tingling of quality. It is severe at times. It is imrpoved with meloxicam, vicodin, and gabapentin. Activity makes the pain worse. Of note, the patient has a history of cocaine abuse. However, his last use was in 1998 and he attends 3 NA meetings weekly. I believe that he is taking his medications appropriately.    Past Medical History  Diagnosis Date  . Hyperlipidemia   . Hypertension   . GERD (gastroesophageal reflux disease)   . Tobacco abuse   . COPD (chronic obstructive pulmonary disease)     bronchitis  . Disorder of vocal cord   . Cocaine dependence     Last use 1998, attends 3 NA meetings weekly.  . Right shoulder pain     2/2 partial rotator cuff tear, tendinopathy, mild subacromial/subdeltioid bursitis, A/C joint arthropathy per MRI done in 3/07   Current Outpatient Prescriptions  Medication Sig Dispense Refill  . albuterol (PROVENTIL HFA;VENTOLIN HFA) 108 (90 BASE) MCG/ACT inhaler Inhale 2 puffs into the lungs every 6 (six) hours as needed for wheezing.  1 Inhaler  3  . albuterol (PROVENTIL HFA;VENTOLIN HFA) 108 (90 BASE) MCG/ACT inhaler Inhale 2 puffs into the lungs every 4 (four) hours as needed for wheezing or shortness of breath.  1 Inhaler  3  . aspirin EC 81 MG tablet Take 243 mg by mouth daily.      . benazepril (LOTENSIN) 40 MG tablet Take 40 mg by mouth daily.      Marland Kitchen esomeprazole (NEXIUM 24HR) 20 MG capsule Take 20 mg by mouth daily before  breakfast.      . FLUoxetine (PROZAC) 10 MG capsule Take 1 capsule (10 mg total) by mouth daily.  30 capsule  2  . Fluticasone-Salmeterol (ADVAIR) 500-50 MCG/DOSE AEPB Inhale 1 puff into the lungs every 12 (twelve) hours.      . gabapentin (NEURONTIN) 300 MG capsule Take 1 capsule (300 mg total) by mouth 2 (two) times daily.  60 capsule  2  . hydrochlorothiazide (HYDRODIURIL) 25 MG tablet Take 25 mg by mouth daily.      . meloxicam (MOBIC) 15 MG tablet Take 1 tablet (15 mg total) by mouth daily.  30 tablet  1  . nitroGLYCERIN (NITROSTAT) 0.4 MG SL tablet Place 1 tablet (0.4 mg total) under the tongue every 5 (five) minutes as needed for chest pain.  25 tablet  8  . OVER THE COUNTER MEDICATION Take 3 capsules by mouth daily. [employer] triple strength fish oil + krill  1500mg  fish oil + 300mg  krill oil / 2 capsules      . oxyCODONE-acetaminophen (PERCOCET) 7.5-325 MG per tablet Take 2 tablets by mouth every 6 (six) hours as needed for pain. While awake.  180 tablet  0   No current facility-administered medications for this visit.   Family History  Problem Relation Age of Onset  . Coronary artery disease  Male 1st degree relatives<60  . Diabetes      1 st degree relatives   History   Social History  . Marital Status: Divorced    Spouse Name: N/A    Number of Children: N/A  . Years of Education: N/A   Social History Main Topics  . Smoking status: Current Every Day Smoker -- 0.90 packs/day    Types: Cigarettes  . Smokeless tobacco: None     Comment: Started back x 2 weeks.  . Alcohol Use: No  . Drug Use: No  . Sexual Activity: None   Other Topics Concern  . None   Social History Narrative  . None   Review of Systems: Review of Systems  Constitutional: Negative for fever, chills and weight loss.  Respiratory: Positive for cough, shortness of breath and wheezing.   Cardiovascular: Negative for chest pain, palpitations and orthopnea.  Gastrointestinal: Negative for heartburn,  nausea, vomiting, diarrhea, constipation, blood in stool and melena.  Genitourinary: Negative for dysuria, urgency and frequency.  Musculoskeletal: Positive for back pain. Negative for falls.  Neurological: Negative for dizziness, tingling, tremors, sensory change and headaches.  Psychiatric/Behavioral: Negative for depression, suicidal ideas, hallucinations and substance abuse. The patient is not nervous/anxious.     Objective:  Physical Exam: Filed Vitals:   10/16/13 1555  BP: 165/88  Pulse: 66  Temp: 98.4 F (36.9 C)  TempSrc: Oral  Weight: 221 lb 9.6 oz (100.517 kg)  SpO2: 96%   Physical Exam  Constitutional: He is oriented to person, place, and time. He appears well-developed and well-nourished.  HENT:  Head: Normocephalic.  Mouth/Throat: Oropharynx is clear and moist. No oropharyngeal exudate.  Eyes: Conjunctivae and EOM are normal. Pupils are equal, round, and reactive to light. Right eye exhibits no discharge. Left eye exhibits no discharge.  Cardiovascular: Normal rate, regular rhythm, normal heart sounds and intact distal pulses.  Exam reveals no gallop and no friction rub.   No murmur heard. Pulmonary/Chest: No respiratory distress. He has wheezes. He exhibits no tenderness.  Coarse breath sounds bil  Abdominal: Soft. Bowel sounds are normal. He exhibits no distension and no mass. There is no tenderness. There is no rebound and no guarding. No hernia.  Musculoskeletal:       Arms: Low back pain that radiates to groin (right sided)  Neurological: He is alert and oriented to person, place, and time.  Psychiatric: He has a normal mood and affect. His behavior is normal.    Assessment & Plan:

## 2013-10-17 ENCOUNTER — Encounter: Payer: Self-pay | Admitting: Internal Medicine

## 2013-10-17 NOTE — Assessment & Plan Note (Signed)
A+P: Patient continues to have severe radicular pain of the L1 or L2 distribution. He is on max dose meloxicam which has helped him a lot. Today, I increased his gabapentin from 300 mg tid to 400 mg tid. I did not modify his opiate regimen at this visit. He has pills from his last visit. Of note, the patient has a history of cocaine abuse. However, his last use was in 1998 and he attends 3 NA meetings weekly. I believe that he is taking his medications appropriately. Further, his sponsor has access to his pill bottles at all time for pill counts if necessary.  Moving forward, we discussed the option of further imaging with MRI. He is reticent to complete this as it is very expensive. Thus, I elected to refer him to pain management for evaluation for diagnostic block of right L1 and/or L2 and subsequent treatment. The patient is in agreement with that plan. I will see the patient in early January. If pain persists, may consider MRI at that time.

## 2013-10-17 NOTE — Assessment & Plan Note (Signed)
BP Readings from Last 3 Encounters:  10/16/13 165/88  10/05/13 158/88  09/21/13 149/78    Lab Results  Component Value Date   NA 133* 08/24/2013   K 3.9 08/24/2013   CREATININE 0.75 08/24/2013    Assessment: Blood pressure control: moderately elevated Progress toward BP goal:  unchanged Comments: The patient had been taken off amlodpine at he had low BP at a previous visit. His last three visits have had elevated BP  Plan: Medications:  I added amlodipine 5 mg qd Educational resources provided:   Self management tools provided:   Other plans: Will f/u at future visits. BP may decrease once pain is better controlled.

## 2013-10-23 ENCOUNTER — Other Ambulatory Visit: Payer: Self-pay | Admitting: *Deleted

## 2013-10-23 DIAGNOSIS — J449 Chronic obstructive pulmonary disease, unspecified: Secondary | ICD-10-CM

## 2013-10-23 MED ORDER — ESOMEPRAZOLE MAGNESIUM 20 MG PO CPDR: 20.0000 mg | DELAYED_RELEASE_CAPSULE | Freq: Every day | ORAL | Status: DC

## 2013-10-23 MED ORDER — GABAPENTIN 100 MG PO CAPS: 400.0000 mg | ORAL_CAPSULE | Freq: Three times a day (TID) | ORAL | Status: DC

## 2013-10-23 MED ORDER — FLUOXETINE HCL 10 MG PO CAPS: 10.0000 mg | ORAL_CAPSULE | Freq: Every day | ORAL | Status: DC

## 2013-10-23 MED ORDER — FLUTICASONE-SALMETEROL 500-50 MCG/DOSE IN AEPB: 1.0000 | INHALATION_SPRAY | Freq: Two times a day (BID) | RESPIRATORY_TRACT | Status: DC

## 2013-10-23 MED ORDER — NITROGLYCERIN 0.4 MG SL SUBL: 0.4000 mg | SUBLINGUAL_TABLET | SUBLINGUAL | Status: DC | PRN

## 2013-10-23 MED ORDER — ALBUTEROL SULFATE HFA 108 (90 BASE) MCG/ACT IN AERS: 2.0000 | INHALATION_SPRAY | Freq: Four times a day (QID) | RESPIRATORY_TRACT | Status: DC | PRN

## 2013-10-23 NOTE — Telephone Encounter (Signed)
Pt has finished paperwork or will Thursday for [facility], they have given him a list of what they have and what could be changed as to offer him either free or low cost options, this is what needs to be changed: amlodipine to BENICAR, atorvastatin to CRESTOR, benazepril to ACCURETIC this will also cover the hctz, meloxicam to CELEBREX and [employer] triple strength fish oil w/ krill to LOVAZA. Please consider these changes and let me know i will call them in.

## 2013-10-23 NOTE — Progress Notes (Signed)
I saw and evaluated the patient.  I personally confirmed the key portions of the history and exam documented by Dr. Komanski and I reviewed pertinent patient test results.  The assessment, diagnosis, and plan were formulated together and I agree with the documentation in the resident's note.  

## 2013-10-24 ENCOUNTER — Other Ambulatory Visit: Payer: Self-pay | Admitting: Internal Medicine

## 2013-10-24 DIAGNOSIS — I10 Essential (primary) hypertension: Secondary | ICD-10-CM

## 2013-10-24 MED ORDER — QUINAPRIL-HYDROCHLOROTHIAZIDE 20-25 MG PO TABS: 1.0000 | ORAL_TABLET | Freq: Every day | ORAL | Status: DC

## 2013-10-24 MED ORDER — AMLODIPINE BESYLATE 5 MG PO TABS: 5.0000 mg | ORAL_TABLET | Freq: Every day | ORAL | Status: DC

## 2013-10-24 MED ORDER — OMEGA-3-ACID ETHYL ESTERS 1 G PO CAPS: 2.0000 g | ORAL_CAPSULE | Freq: Two times a day (BID) | ORAL | Status: DC

## 2013-10-24 MED ORDER — ROSUVASTATIN CALCIUM 20 MG PO TABS: 20.0000 mg | ORAL_TABLET | Freq: Every day | ORAL | Status: DC

## 2013-10-24 NOTE — Telephone Encounter (Signed)
Dr Glendell Docker, this is the pt that you were asking about, please read the note previous and consider the changes, also were you planning to stop the meloxicam? Because the county can get celebrex for free

## 2013-10-25 ENCOUNTER — Other Ambulatory Visit: Payer: Self-pay | Admitting: Internal Medicine

## 2013-10-25 NOTE — Telephone Encounter (Signed)
I just switched patient to accuretic which is a combined ACEi HCTZ that the patient can get through [facility] for free.

## 2013-10-30 ENCOUNTER — Encounter: Payer: Self-pay | Admitting: Internal Medicine

## 2013-10-30 ENCOUNTER — Ambulatory Visit (INDEPENDENT_AMBULATORY_CARE_PROVIDER_SITE_OTHER): Payer: No Typology Code available for payment source | Admitting: Internal Medicine

## 2013-10-30 VITALS — BP 157/90 | HR 71 | Temp 97.8°F | Ht 71.0 in | Wt 223.9 lb

## 2013-10-30 DIAGNOSIS — F17200 Nicotine dependence, unspecified, uncomplicated: Secondary | ICD-10-CM

## 2013-10-30 DIAGNOSIS — M549 Dorsalgia, unspecified: Secondary | ICD-10-CM

## 2013-10-30 DIAGNOSIS — S32020S Wedge compression fracture of second lumbar vertebra, sequela: Secondary | ICD-10-CM

## 2013-10-30 DIAGNOSIS — X58XXXS Exposure to other specified factors, sequela: Secondary | ICD-10-CM

## 2013-10-30 DIAGNOSIS — I10 Essential (primary) hypertension: Secondary | ICD-10-CM

## 2013-10-30 DIAGNOSIS — IMO0002 Reserved for concepts with insufficient information to code with codable children: Secondary | ICD-10-CM

## 2013-10-30 MED ORDER — OXYCODONE-ACETAMINOPHEN 7.5-325 MG PO TABS: 2.0000 | ORAL_TABLET | Freq: Four times a day (QID) | ORAL | Status: DC | PRN

## 2013-10-30 MED ORDER — VARENICLINE TARTRATE 1 MG PO TABS: ORAL_TABLET | ORAL | Status: DC

## 2013-10-30 MED ORDER — VARENICLINE TARTRATE 0.5 MG PO TABS: ORAL_TABLET | ORAL | Status: DC

## 2013-10-30 NOTE — Assessment & Plan Note (Signed)
Patient presents today for refill of his pain medication. He's currently taking oxycodone 7.5-325mg  1 to 2 tablets every 6 hours when necessary pain. He was given #180 tablets on 10/05/13; however this would only give him enough medication to take 1 tablet every 6 hours. He has about 6 tablets left, so I have refilled his narcotic with #200. He has been referred to pain medicine for a L1 or L2 block, which is still in the works. He's to follow up with Dr. Glendell Docker in one month.  -Percocet 7.5-325 mg one to 2 tablets every 6 hours when necessary pain, dispense #200

## 2013-10-30 NOTE — Progress Notes (Signed)
Patient ID: Bradley Sawyer, male   DOB: 02-28-1956, 57 y.o.   MRN: [id number]  Subjective:   Patient ID: Bradley Sawyer male   DOB: [birth year] 57 y.o.   MRN: [id number]  HPI: [name] is a 57 y.o. M with PMH HTN and chronic back pain presents tot he clinic for a f/u of his back pain.  Patient with history of a compression fracture at L2, which causes radicular pain in the L1 to 2 distribution that extends into his right groin. He was seen by Dr. Glendell Sawyer 11/25, where at that time Dr. Glendell Sawyer increase his gabapentin to 4 mg 3 times a day patient was to take in addition to his meloxicam and narcotic. Per his note the patient was to be referred to pain management for evaluation for a diagnostic block of the right L1 and or L2 nerve root, and was to followup with Dr. Carollee Sawyer in January. However per his note, he discussed possibility of obtaining an MRI with the patient if his pain persisted. He presents today requesting a refill of is pain medication, Percocet 7.5-325mg  1-2 tablets q6h PRN pain, which was last filled on 10/05/13 with #180 tablets.   He states that he started smoking again secondary to his pain about 2 months ago. He is requesting a prescription for Chantix, which he states he has taken in the past with good results.   Past Medical History  Diagnosis Date  . Hyperlipidemia   . Hypertension   . GERD (gastroesophageal reflux disease)   . Tobacco abuse   . COPD (chronic obstructive pulmonary disease)     bronchitis  . Disorder of vocal cord   . Cocaine dependence     Last use 1998, attends 3 NA meetings weekly.  . Right shoulder pain     2/2 partial rotator cuff tear, tendinopathy, mild subacromial/subdeltioid bursitis, A/C joint arthropathy per MRI done in 3/07   Current Outpatient Prescriptions  Medication Sig Dispense Refill  . albuterol (PROVENTIL HFA;VENTOLIN HFA) 108 (90 BASE) MCG/ACT inhaler Inhale 2 puffs into the lungs every 4 (four) hours as needed for  wheezing or shortness of breath.  1 Inhaler  3  . albuterol (PROVENTIL HFA;VENTOLIN HFA) 108 (90 BASE) MCG/ACT inhaler Inhale 2 puffs into the lungs every 6 (six) hours as needed for wheezing.  3 Inhaler  4  . amLODipine (NORVASC) 5 MG tablet Take 1 tablet (5 mg total) by mouth daily.  30 tablet  11  . aspirin EC 81 MG tablet Take 243 mg by mouth daily.      Marland Kitchen esomeprazole (NEXIUM 24HR) 20 MG capsule Take 1 capsule (20 mg total) by mouth daily before breakfast.  90 capsule  4  . FLUoxetine (PROZAC) 10 MG capsule Take 1 capsule (10 mg total) by mouth daily.  90 capsule  4  . Fluticasone-Salmeterol (ADVAIR) 500-50 MCG/DOSE AEPB Inhale 1 puff into the lungs every 12 (twelve) hours.  60 each  4  . gabapentin (NEURONTIN) 100 MG capsule Take 4 capsules (400 mg total) by mouth 3 (three) times daily.  180 capsule  4  . hydrochlorothiazide (HYDRODIURIL) 25 MG tablet Take 25 mg by mouth daily.      . meloxicam (MOBIC) 15 MG tablet Take 1 tablet (15 mg total) by mouth daily.  30 tablet  1  . nitroGLYCERIN (NITROSTAT) 0.4 MG SL tablet Place 1 tablet (0.4 mg total) under the tongue every 5 (five) minutes as needed for chest pain.  25 tablet  8  . omega-3 acid ethyl esters (LOVAZA) 1 G capsule Take 2 capsules (2 g total) by mouth 2 (two) times daily.  90 capsule  3  . oxyCODONE-acetaminophen (PERCOCET) 7.5-325 MG per tablet Take 2 tablets by mouth every 6 (six) hours as needed for pain. While awake.  180 tablet  0  . quinapril-hydrochlorothiazide (ACCURETIC) 20-25 MG per tablet Take 1 tablet by mouth daily.  90 tablet  3  . rosuvastatin (CRESTOR) 20 MG tablet Take 1 tablet (20 mg total) by mouth at bedtime.  30 tablet  11   No current facility-administered medications for this visit.   Family History  Problem Relation Age of Onset  . Coronary artery disease      Male 1st degree relatives<60  . Diabetes      1 st degree relatives   History   Social History  . Marital Status: Divorced    Spouse Name:  N/A    Number of Children: N/A  . Years of Education: N/A   Social History Main Topics  . Smoking status: Current Every Day Smoker -- 0.90 packs/day    Types: Cigarettes  . Smokeless tobacco: Not on file     Comment: Started back x 2 weeks.  . Alcohol Use: No  . Drug Use: No  . Sexual Activity: Not on file   Other Topics Concern  . Not on file   Social History Narrative  . No narrative on file   Review of Systems: A 12 point ROS was performed; pertinent positives and negatives were noted in the HPI   Objective:  Physical Exam: There were no vitals filed for this visit. Constitutional: Vital signs reviewed.  Patient is a well-developed and well-nourished male in no acute distress and cooperative with exam.  Head: Normocephalic and atraumatic Eyes: PERRL, EOMI, conjunctivae normal, No scleral icterus.  Cardiovascular: RRR, no MRG Pulmonary/Chest: normal respiratory effort, CTAB, no wheezes, rales, or rhonchi Abdominal: Soft. Non-tender, non-distended, bowel sounds are normal, no masses, organomegaly, or guarding present.  Musculoskeletal: No joint deformities and no non-tender, except +point tenderness around the location of his L1-L2 nerve root on the right. Neurological: A&O x3, cranial nerve II-XII are grossly intact, right sided radicular pain  Skin: Warm, dry and intact.   Psychiatric: Normal mood and affect. speech and behavior is normal.   Assessment & Plan:   Please refer to Problem List based Assessment and Plan

## 2013-10-30 NOTE — Assessment & Plan Note (Signed)
BP Readings from Last 3 Encounters:  10/30/13 157/90  10/16/13 165/88  10/05/13 158/88    Lab Results  Component Value Date   NA 133* 08/24/2013   K 3.9 08/24/2013   CREATININE 0.75 08/24/2013    Assessment: Blood pressure control: mildly elevated Progress toward BP goal:  improved (Elevation likely secondary to pain) Comments: The started on amlodipine 5 mg at his last visit, which he states he is taking. His blood pressure is improved from his last visit. Going to recheck his blood pressure prior to patient leaving, but he left before we're able to repeat it.  Plan: Medications:  continue current medications Other plans: Patient to followup in one month with his PCP.

## 2013-10-30 NOTE — Assessment & Plan Note (Signed)
  Assessment: Progress toward smoking cessation:    patient states he started smoking about 2 months ago which he attributes to his pain Barriers to progress toward smoking cessation:    he is ready to quit. He said he has quit twice in the past with the use of Chantix. Comments: He is requesting another prescription for Chantix, which he states he can get about the apartment  Plan: Instruction/counseling given:  I counseled patient on the dangers of tobacco use, advised patient to stop smoking, and reviewed strategies to maximize success. Medications to assist with smoking cessation:  Varenicline (Chantix) Other plans: He is to try the Chantix. We discussed the potential side effects of the medication, and he was given a medication profile.

## 2013-10-30 NOTE — Patient Instructions (Signed)
Varenicline oral tablets What is this medicine? VARENICLINE (var EN i kleen) is used to help people quit smoking. It can reduce the symptoms caused by stopping smoking. It is used with a patient support program recommended by your physician. This medicine may be used for other purposes; ask your health care provider or pharmacist if you have questions. COMMON BRAND NAME(S): Chantix What should I tell my health care provider before I take this medicine? They need to know if you have any of these conditions: -bipolar disorder, depression, schizophrenia or other mental illness -heart disease -kidney disease -peripheral vascular disease -stroke -suicidal thoughts, plans, or attempt; a previous suicide attempt by you or a family member -an unusual or allergic reaction to varenicline, other medicines, foods, dyes, or preservatives -pregnant or trying to get pregnant -breast-feeding How should I use this medicine? You should set a date to stop smoking and tell your doctor. Start this medicine one week before the quit date. You can also start taking this medicine before you choose a quit date, and then pick a quit date that is between 8 and 35 days of treatment with this medicine. Stick to your plan; ask about support groups or other ways to help you remain a 'quitter'. Take this medicine by mouth after eating. Take with a full glass of water. Follow the directions on the prescription label. Take your doses at regular intervals. Do not take your medicine more often than directed. A special MedGuide will be given to you by the pharmacist with each prescription and refill. Be sure to read this information carefully each time. Talk to your pediatrician regarding the use of this medicine in children. This medicine is not approved for use in children. Overdosage: If you think you have taken too much of this medicine contact a poison control center or emergency room at once. NOTE: This medicine is only for  you. Do not share this medicine with others. What if I miss a dose? If you miss a dose, take it as soon as you can. If it is almost time for your next dose, take only that dose. Do not take double or extra doses. What may interact with this medicine? -insulin -other stop smoking aids -theophylline -warfarin This list may not describe all possible interactions. Give your health care provider a list of all the medicines, herbs, non-prescription drugs, or dietary supplements you use. Also tell them if you smoke, drink alcohol, or use illegal drugs. Some items may interact with your medicine. What should I watch for while using this medicine? Visit your doctor or health care professional for regular check ups. Ask for ongoing advice and encouragement from your doctor or healthcare professional, friends, and family to help you quit. If you smoke while on this medication, quit again Your mouth may get dry. Chewing sugarless gum or sucking hard candy, and drinking plenty of water may help. Contact your doctor if the problem does not go away or is severe. You may get drowsy or dizzy. Do not drive, use machinery, or do anything that needs mental alertness until you know how this medicine affects you. Do not stand or sit up quickly, especially if you are an older patient. This reduces the risk of dizzy or fainting spells. The use of this medicine may increase the chance of suicidal thoughts or actions. Pay special attention to how you are responding while on this medicine. Any worsening of mood, or thoughts of suicide or dying should be reported to your health  care professional right away. What side effects may I notice from receiving this medicine? Side effects that you should report to your doctor or health care professional as soon as possible: -allergic reactions like skin rash, itching or hives, swelling of the face, lips, tongue, or throat -breathing problems -changes in vision -chest pain or chest  tightness -confusion, trouble speaking or understanding -fast, irregular heartbeat -feeling faint or lightheaded, falls -fever -pain in legs when walking -problems with balance, talking, walking -ringing in ears -sudden numbness or weakness of the face, arm or leg -suicidal thoughts or other mood changes -trouble passing urine or change in the amount of urine -unusual bleeding or bruising -unusually weak or tired Side effects that usually do not require medical attention (report to your doctor or health care professional if they continue or are bothersome): -constipation -headache -nausea, vomiting -strange dreams -stomach gas -trouble sleeping This list may not describe all possible side effects. Call your doctor for medical advice about side effects. You may report side effects to FDA at 1-800-FDA-1088. Where should I keep my medicine? Keep out of the reach of children. Store at room temperature between 15 and 30 degrees C (59 and 86 degrees F). Throw away any unused medicine after the expiration date. NOTE: This sheet is a summary. It may not cover all possible information. If you have questions about this medicine, talk to your doctor, pharmacist, or health care provider.  2014, Elsevier/Gold Standard. (2010-06-15 15:12:38)

## 2013-10-31 NOTE — Progress Notes (Signed)
Case discussed with Dr. Glenn at time of visit.  We reviewed the resident's history and exam and pertinent patient test results.  I agree with the assessment, diagnosis, and plan of care documented in the resident's note. 

## 2013-11-27 ENCOUNTER — Encounter: Payer: Self-pay | Admitting: Internal Medicine

## 2013-11-27 ENCOUNTER — Other Ambulatory Visit: Payer: Self-pay | Admitting: Neurosurgery

## 2013-11-27 DIAGNOSIS — S32009A Unspecified fracture of unspecified lumbar vertebra, initial encounter for closed fracture: Secondary | ICD-10-CM

## 2013-11-28 ENCOUNTER — Ambulatory Visit
Admission: RE | Admit: 2013-11-28 | Discharge: 2013-11-28 | Disposition: A | Discharge status: Home / Self Care | Payer: Medicaid Other | Source: Ambulatory Visit | Attending: Neurosurgery | Admitting: Neurosurgery

## 2013-11-28 DIAGNOSIS — S32009A Unspecified fracture of unspecified lumbar vertebra, initial encounter for closed fracture: Secondary | ICD-10-CM

## 2013-12-03 ENCOUNTER — Encounter: Payer: Self-pay | Admitting: Internal Medicine

## 2013-12-03 ENCOUNTER — Ambulatory Visit (INDEPENDENT_AMBULATORY_CARE_PROVIDER_SITE_OTHER): Payer: Medicaid Other | Admitting: Internal Medicine

## 2013-12-03 VITALS — BP 153/86 | HR 67 | Temp 97.7°F | Wt 227.8 lb

## 2013-12-03 DIAGNOSIS — S32020S Wedge compression fracture of second lumbar vertebra, sequela: Secondary | ICD-10-CM

## 2013-12-03 DIAGNOSIS — J4489 Other specified chronic obstructive pulmonary disease: Secondary | ICD-10-CM

## 2013-12-03 DIAGNOSIS — J449 Chronic obstructive pulmonary disease, unspecified: Secondary | ICD-10-CM

## 2013-12-03 DIAGNOSIS — S32009A Unspecified fracture of unspecified lumbar vertebra, initial encounter for closed fracture: Secondary | ICD-10-CM

## 2013-12-03 DIAGNOSIS — F17200 Nicotine dependence, unspecified, uncomplicated: Secondary | ICD-10-CM

## 2013-12-03 DIAGNOSIS — S32020A Wedge compression fracture of second lumbar vertebra, initial encounter for closed fracture: Secondary | ICD-10-CM

## 2013-12-03 DIAGNOSIS — I10 Essential (primary) hypertension: Secondary | ICD-10-CM

## 2013-12-03 MED ORDER — MELOXICAM 15 MG PO TABS: 15.0000 mg | ORAL_TABLET | Freq: Every day | ORAL | Status: DC

## 2013-12-03 MED ORDER — GABAPENTIN 300 MG PO CAPS: 600.0000 mg | ORAL_CAPSULE | Freq: Three times a day (TID) | ORAL | Status: DC

## 2013-12-03 MED ORDER — OXYCODONE-ACETAMINOPHEN 10-325 MG PO TABS: 2.0000 | ORAL_TABLET | Freq: Four times a day (QID) | ORAL | Status: DC | PRN

## 2013-12-03 MED ORDER — ESOMEPRAZOLE MAGNESIUM 20 MG PO CPDR: 20.0000 mg | DELAYED_RELEASE_CAPSULE | Freq: Every day | ORAL | Status: DC

## 2013-12-03 MED ORDER — VARENICLINE TARTRATE 1 MG PO TABS: ORAL_TABLET | ORAL | Status: DC

## 2013-12-03 MED ORDER — ROSUVASTATIN CALCIUM 20 MG PO TABS: 20.0000 mg | ORAL_TABLET | Freq: Every day | ORAL | Status: DC

## 2013-12-03 MED ORDER — QUINAPRIL-HYDROCHLOROTHIAZIDE 20-25 MG PO TABS: 1.0000 | ORAL_TABLET | Freq: Every day | ORAL | Status: DC

## 2013-12-03 MED ORDER — FLUTICASONE-SALMETEROL 500-50 MCG/DOSE IN AEPB: 1.0000 | INHALATION_SPRAY | Freq: Two times a day (BID) | RESPIRATORY_TRACT | Status: DC

## 2013-12-03 MED ORDER — AMLODIPINE BESYLATE 5 MG PO TABS: 5.0000 mg | ORAL_TABLET | Freq: Every day | ORAL | Status: DC

## 2013-12-03 MED ORDER — FLUOXETINE HCL 10 MG PO CAPS: 10.0000 mg | ORAL_CAPSULE | Freq: Every day | ORAL | Status: DC

## 2013-12-03 MED ORDER — ALBUTEROL SULFATE HFA 108 (90 BASE) MCG/ACT IN AERS: 2.0000 | INHALATION_SPRAY | Freq: Four times a day (QID) | RESPIRATORY_TRACT | Status: DC | PRN

## 2013-12-03 NOTE — Patient Instructions (Signed)
I have prescribed you 200 tablets of oxycodone-APAP 10-325 mg for the next month.  Please continue to see Dr. Annette Stable regarding your compression fractures. I will plan to see you in 4-6 weeks. At that time, we will discuss referral to pain management.

## 2013-12-03 NOTE — Progress Notes (Signed)
Subjective:   Patient ID: Bradley Sawyer male   DOB: 1956/01/11 58 y.o.   MRN: [id number]  HPI: Bradley Sawyer is a 58 y.o. man for f/u regarding his radicular pain. The patient has continued to have worsening low back pain that radiates to his right testicle. This pain developed acutely after suffering trauma when a lawnmower rolled voer him. The patient is very upset that he has been unable to see me as I had no clinic appointments until this month. He is also upset that his medicaid card was not accepted by our clinic because it listed the wrong PCP. As he felt "abandoned" by our clinic, he decided to self refer himself to a neurosurgeon (Dr. Annette Stable) who had an MRI completed last week. The MRI re-demonstrated the known compression fracture at L2 with surrounding pathology. The patient states that Dr. Annette Stable is recommending surgical correction of this problem. The patient reports worsening pain over the last 3 weeks. He reports using 6-10 pills on bad days and 2-4 pills on good days. He states that he does not want to be on narcotics long term and wishes to pursue surgery as a means to getting off the narcotics.     Past Medical History  Diagnosis Date  . Hyperlipidemia   . Hypertension   . GERD (gastroesophageal reflux disease)   . Tobacco abuse   . COPD (chronic obstructive pulmonary disease)     bronchitis  . Disorder of vocal cord   . Cocaine dependence     Last use 1998, attends 3 NA meetings weekly.  . Right shoulder pain     2/2 partial rotator cuff tear, tendinopathy, mild subacromial/subdeltioid bursitis, A/C joint arthropathy per MRI done in 3/07   Current Outpatient Prescriptions  Medication Sig Dispense Refill  . albuterol (PROVENTIL HFA;VENTOLIN HFA) 108 (90 BASE) MCG/ACT inhaler Inhale 2 puffs into the lungs every 6 (six) hours as needed for wheezing.  3 Inhaler  4  . amLODipine (NORVASC) 5 MG tablet Take 1 tablet (5 mg total) by mouth daily.  30 tablet  11  . aspirin  EC 81 MG tablet Take 243 mg by mouth daily.      Marland Kitchen esomeprazole (NEXIUM 24HR) 20 MG capsule Take 1 capsule (20 mg total) by mouth daily before breakfast.  90 capsule  4  . FLUoxetine (PROZAC) 10 MG capsule Take 1 capsule (10 mg total) by mouth daily.  90 capsule  4  . Fluticasone-Salmeterol (ADVAIR) 500-50 MCG/DOSE AEPB Inhale 1 puff into the lungs every 12 (twelve) hours.  60 each  4  . gabapentin (NEURONTIN) 300 MG capsule Take 2 capsules (600 mg total) by mouth 3 (three) times daily.  180 capsule  4  . meloxicam (MOBIC) 15 MG tablet Take 1 tablet (15 mg total) by mouth daily.  30 tablet  1  . nitroGLYCERIN (NITROSTAT) 0.4 MG SL tablet Place 1 tablet (0.4 mg total) under the tongue every 5 (five) minutes as needed for chest pain.  25 tablet  8  . omega-3 acid ethyl esters (LOVAZA) 1 G capsule Take 2 capsules (2 g total) by mouth 2 (two) times daily.  90 capsule  3  . oxyCODONE-acetaminophen (PERCOCET) 10-325 MG per tablet Take 2 tablets by mouth every 6 (six) hours as needed for pain.  200 tablet  0  . quinapril-hydrochlorothiazide (ACCURETIC) 20-25 MG per tablet Take 1 tablet by mouth daily.  90 tablet  3  . rosuvastatin (CRESTOR) 20 MG tablet Take  1 tablet (20 mg total) by mouth at bedtime.  30 tablet  11  . varenicline (CHANTIX CONTINUING MONTH PAK) 1 MG tablet 1 tab twice daily from day 8 after finishing up the other prescription 0.5 mg tabs as instructed.  60 tablet  2  . varenicline (CHANTIX) 0.5 MG tablet 1 tab once daily for 3 days and then take 1 tab twice daily for next 4 days and then switch to 2 mg tablets as instructed.  11 tablet  0   No current facility-administered medications for this visit.   Family History  Problem Relation Age of Onset  . Coronary artery disease      Male 1st degree relatives<60  . Diabetes      1 st degree relatives   History   Social History  . Marital Status: Divorced    Spouse Name: N/A    Number of Children: N/A  . Years of Education: N/A    Social History Main Topics  . Smoking status: Current Every Day Smoker -- 1.00 packs/day    Types: Cigarettes  . Smokeless tobacco: None     Comment: Started back x 2 weeks.  . Alcohol Use: No  . Drug Use: No  . Sexual Activity: None   Other Topics Concern  . None   Social History Narrative  . None   Review of Systems: Review of Systems  Constitutional: Negative for fever, chills, weight loss and malaise/fatigue.  HENT: Negative for sore throat.   Gastrointestinal: Negative for heartburn, nausea, vomiting, abdominal pain, diarrhea and constipation.  Musculoskeletal: Positive for back pain, joint pain and myalgias. Negative for falls.  Neurological: Positive for weakness. Negative for dizziness, sensory change, speech change, focal weakness and headaches.  Psychiatric/Behavioral: Positive for depression. Negative for suicidal ideas and substance abuse. The patient has insomnia. The patient is not nervous/anxious.     Objective:  Physical Exam: Filed Vitals:   12/03/13 1454  BP: 153/86  Pulse: 67  Temp: 97.7 F (36.5 C)  TempSrc: Oral  Weight: 227 lb 12.8 oz (103.329 kg)  SpO2: 94%   Physical Exam  Constitutional: He appears well-developed and well-nourished.  HENT:  Head: Normocephalic.  Mouth/Throat: Oropharynx is clear and moist.  Cardiovascular: Normal rate, regular rhythm, normal heart sounds and intact distal pulses.  Exam reveals no gallop and no friction rub.   No murmur heard. Pulmonary/Chest: Effort normal. No respiratory distress. He has wheezes. He has no rales. He exhibits no tenderness.  Abdominal: Soft. Bowel sounds are normal. He exhibits no distension. There is no tenderness.  Musculoskeletal:       Lumbar back: He exhibits decreased range of motion and tenderness. He exhibits no swelling, no edema and no deformity.       Back:       Legs: Pain across low back that radiates in the L2 dermatome.     Assessment & Plan:

## 2013-12-04 NOTE — Progress Notes (Signed)
Case discussed with Dr. Komanski at the time of the visit.  We reviewed the resident's history and exam and pertinent patient test results.  I agree with the assessment, diagnosis, and plan of care documented in the resident's note.      

## 2013-12-04 NOTE — Assessment & Plan Note (Signed)
A: Patient has established care with Dr. Annette Stable, a neurosurgeon, who i recommending lumbar fusion. I spent approximately 45 minutes reviewing the the patients MRI with him and explaining the various forms of treatment. The patient is reticent to establish care with a pain management physician because he has heard some bad stories from other members of narcotics [employer]. I explained that there are very good pain doctors in [place] ([facility]) who I would recommend for him. They have a lot of options to treat hi including injections and nerve ablation. The patient was interested in a referral to pain medicine at the next visit after his likely upcoming surgery.   P: Follow up with patient in 4 weeks and make likely pain management referral to [facility].

## 2013-12-05 ENCOUNTER — Encounter: Payer: Self-pay | Admitting: Licensed Clinical Social Worker

## 2013-12-05 ENCOUNTER — Other Ambulatory Visit: Payer: Self-pay | Admitting: Neurosurgery

## 2013-12-13 NOTE — Pre-Procedure Instructions (Signed)
Argil Mahl Bouknight  12/13/2013   Your procedure is scheduled on:  Tues, Jan 27 @ 7:30 AM  Report to [facility]  at 5:30 AM.  Call this number if you have problems the morning of surgery: [phone]   Remember:   Do not eat food or drink liquids after midnight.   Take these medicines the morning of surgery with A SIP OF WATER: Albuterol<Bring Your Inhaler With You>,Amlodipine(Norvasc),Esomeprazole(Nexium),Fluoxetine(Prozac),Advair(Fluticasone-Salmeterol), and Pain Pill(if needed)                Stop taking your Aspirin and Meloxicam. No Goody's,BC's,Aleve,Ibuprofen,Fish Oil,or any Herbal Medications   Do not wear jewelry  Do not wear lotions, powders, or colognes. You may wear deodorant.  Men may shave face and neck.  Do not bring valuables to the hospital.  [facility] is not responsible                  for any belongings or valuables.               Contacts, dentures or bridgework may not be worn into surgery.  Leave suitcase in the car. After surgery it may be brought to your room.  For patients admitted to the hospital, discharge time is determined by your                treatment team.                Special Instructions: Shower using CHG 2 nights before surgery and the night before surgery.  If you shower the day of surgery use CHG.  Use special wash - you have one bottle of CHG for all showers.  You should use approximately 1/3 of the bottle for each shower.   Please read over the following fact sheets that you were given: Pain Booklet, Coughing and Deep Breathing, Blood Transfusion Information, MRSA Information and Surgical Site Infection Prevention

## 2013-12-14 ENCOUNTER — Encounter (HOSPITAL_COMMUNITY)
Admission: RE | Admit: 2013-12-14 | Discharge: 2013-12-14 | Disposition: A | Discharge status: Home / Self Care | Payer: Medicaid Other | Source: Ambulatory Visit | Attending: Neurosurgery | Admitting: Neurosurgery

## 2013-12-14 ENCOUNTER — Encounter (HOSPITAL_COMMUNITY): Payer: Self-pay

## 2013-12-14 DIAGNOSIS — Z01818 Encounter for other preprocedural examination: Secondary | ICD-10-CM | POA: Insufficient documentation

## 2013-12-14 DIAGNOSIS — Z01812 Encounter for preprocedural laboratory examination: Secondary | ICD-10-CM | POA: Insufficient documentation

## 2013-12-14 HISTORY — DX: Anxiety disorder, unspecified: F41.9

## 2013-12-14 HISTORY — DX: Unspecified osteoarthritis, unspecified site: M19.90

## 2013-12-14 LAB — SURGICAL PCR SCREEN
MRSA, PCR: NEGATIVE
Staphylococcus aureus: POSITIVE — AB

## 2013-12-14 LAB — CBC WITH DIFFERENTIAL/PLATELET
BASOS ABS: 0 10*3/uL (ref 0.0–0.1)
BASOS PCT: 0 % (ref 0–1)
Eosinophils Absolute: 0.2 10*3/uL (ref 0.0–0.7)
Eosinophils Relative: 2 % (ref 0–5)
HEMATOCRIT: 41 % (ref 39.0–52.0)
HEMOGLOBIN: 14.5 g/dL (ref 13.0–17.0)
LYMPHS ABS: 4.1 10*3/uL — AB (ref 0.7–4.0)
Lymphocytes Relative: 39 % (ref 12–46)
MCH: 31.9 pg (ref 26.0–34.0)
MCHC: 35.4 g/dL (ref 30.0–36.0)
MCV: 90.1 fL (ref 78.0–100.0)
MONOS PCT: 9 % (ref 3–12)
Monocytes Absolute: 1 10*3/uL (ref 0.1–1.0)
NEUTROS ABS: 5.3 10*3/uL (ref 1.7–7.7)
Neutrophils Relative %: 50 % (ref 43–77)
Platelets: 192 10*3/uL (ref 150–400)
RBC: 4.55 MIL/uL (ref 4.22–5.81)
RDW: 12.2 % (ref 11.5–15.5)
WBC: 10.6 10*3/uL — AB (ref 4.0–10.5)

## 2013-12-14 LAB — BASIC METABOLIC PANEL
BUN: 13 mg/dL (ref 6–23)
CHLORIDE: 101 meq/L (ref 96–112)
CO2: 30 meq/L (ref 19–32)
CREATININE: 0.86 mg/dL (ref 0.50–1.35)
Calcium: 9.6 mg/dL (ref 8.4–10.5)
GFR calc non Af Amer: >90 mL/min (ref >90)
Glucose, Bld: 79 mg/dL (ref 70–99)
POTASSIUM: 3.9 meq/L (ref 3.7–5.3)
SODIUM: 143 meq/L (ref 137–147)

## 2013-12-14 LAB — TYPE AND SCREEN
ABO/RH(D): O POS
Antibody Screen: NEGATIVE

## 2013-12-14 LAB — ABO/RH: ABO/RH(D): O POS

## 2013-12-14 NOTE — Progress Notes (Signed)
PT. Notified of positive PCR for Staph.Prescription for mupirocin called to [facility] on [address].

## 2013-12-17 MED ORDER — CEFAZOLIN SODIUM-DEXTROSE 2-3 GM-% IV SOLR
2.0000 g | INTRAVENOUS | Status: AC
  Administered 2013-12-18: 2 g via INTRAVENOUS
  Filled 2013-12-17: qty 50

## 2013-12-18 ENCOUNTER — Telehealth: Payer: Self-pay | Admitting: *Deleted

## 2013-12-18 ENCOUNTER — Encounter (HOSPITAL_COMMUNITY)
Admission: RE | Disposition: A | Discharge status: Home Health | Payer: Self-pay | Source: Ambulatory Visit | Attending: Neurosurgery

## 2013-12-18 ENCOUNTER — Inpatient Hospital Stay (HOSPITAL_COMMUNITY): Payer: Medicaid Other

## 2013-12-18 ENCOUNTER — Inpatient Hospital Stay (HOSPITAL_COMMUNITY): Payer: Medicaid Other | Admitting: Certified Registered Nurse Anesthetist

## 2013-12-18 ENCOUNTER — Encounter (HOSPITAL_COMMUNITY): Payer: Medicaid Other | Admitting: Certified Registered Nurse Anesthetist

## 2013-12-18 ENCOUNTER — Inpatient Hospital Stay (HOSPITAL_COMMUNITY)
Admission: RE | Admit: 2013-12-18 | Discharge: 2013-12-26 | DRG: 456 | Disposition: A | Discharge status: Home Health | Payer: Medicaid Other | Source: Ambulatory Visit | Attending: Neurosurgery | Admitting: Neurosurgery

## 2013-12-18 ENCOUNTER — Encounter (HOSPITAL_COMMUNITY): Payer: Self-pay | Admitting: Neurosurgery

## 2013-12-18 DIAGNOSIS — G8190 Hemiplegia, unspecified affecting unspecified side: Secondary | ICD-10-CM | POA: Diagnosis not present

## 2013-12-18 DIAGNOSIS — I10 Essential (primary) hypertension: Secondary | ICD-10-CM | POA: Diagnosis present

## 2013-12-18 DIAGNOSIS — Z79899 Other long term (current) drug therapy: Secondary | ICD-10-CM

## 2013-12-18 DIAGNOSIS — F1210 Cannabis abuse, uncomplicated: Secondary | ICD-10-CM | POA: Diagnosis present

## 2013-12-18 DIAGNOSIS — F17200 Nicotine dependence, unspecified, uncomplicated: Secondary | ICD-10-CM | POA: Diagnosis present

## 2013-12-18 DIAGNOSIS — K219 Gastro-esophageal reflux disease without esophagitis: Secondary | ICD-10-CM | POA: Diagnosis present

## 2013-12-18 DIAGNOSIS — F411 Generalized anxiety disorder: Secondary | ICD-10-CM | POA: Diagnosis present

## 2013-12-18 DIAGNOSIS — J4489 Other specified chronic obstructive pulmonary disease: Secondary | ICD-10-CM | POA: Diagnosis present

## 2013-12-18 DIAGNOSIS — J449 Chronic obstructive pulmonary disease, unspecified: Secondary | ICD-10-CM | POA: Diagnosis present

## 2013-12-18 DIAGNOSIS — F1420 Cocaine dependence, uncomplicated: Secondary | ICD-10-CM | POA: Diagnosis present

## 2013-12-18 DIAGNOSIS — R471 Dysarthria and anarthria: Secondary | ICD-10-CM | POA: Diagnosis not present

## 2013-12-18 DIAGNOSIS — M8448XA Pathological fracture, other site, initial encounter for fracture: Principal | ICD-10-CM | POA: Diagnosis present

## 2013-12-18 DIAGNOSIS — Z7982 Long term (current) use of aspirin: Secondary | ICD-10-CM

## 2013-12-18 DIAGNOSIS — R29810 Facial weakness: Secondary | ICD-10-CM | POA: Diagnosis not present

## 2013-12-18 DIAGNOSIS — R488 Other symbolic dysfunctions: Secondary | ICD-10-CM | POA: Diagnosis not present

## 2013-12-18 DIAGNOSIS — Z833 Family history of diabetes mellitus: Secondary | ICD-10-CM

## 2013-12-18 DIAGNOSIS — E785 Hyperlipidemia, unspecified: Secondary | ICD-10-CM | POA: Diagnosis present

## 2013-12-18 DIAGNOSIS — M4000 Postural kyphosis, site unspecified: Secondary | ICD-10-CM | POA: Diagnosis present

## 2013-12-18 DIAGNOSIS — S32000A Wedge compression fracture of unspecified lumbar vertebra, initial encounter for closed fracture: Secondary | ICD-10-CM | POA: Diagnosis present

## 2013-12-18 DIAGNOSIS — I6340 Cerebral infarction due to embolism of unspecified cerebral artery: Secondary | ICD-10-CM | POA: Diagnosis not present

## 2013-12-18 DIAGNOSIS — Z8249 Family history of ischemic heart disease and other diseases of the circulatory system: Secondary | ICD-10-CM

## 2013-12-18 DIAGNOSIS — I6529 Occlusion and stenosis of unspecified carotid artery: Secondary | ICD-10-CM | POA: Diagnosis present

## 2013-12-18 HISTORY — PX: LUMBAR SPINE SURGERY: SHX701

## 2013-12-18 HISTORY — PX: ANTERIOR LAT LUMBAR FUSION: SHX1168

## 2013-12-18 SURGERY — ANTERIOR LATERAL LUMBAR FUSION 1 LEVEL
Anesthesia: General

## 2013-12-18 MED ORDER — LISINOPRIL 20 MG PO TABS
20.0000 mg | ORAL_TABLET | Freq: Every day | ORAL | Status: DC
  Administered 2013-12-18 – 2013-12-26 (×8): 20 mg via ORAL
  Filled 2013-12-18 (×9): qty 1

## 2013-12-18 MED ORDER — DEXAMETHASONE SODIUM PHOSPHATE 10 MG/ML IJ SOLN
INTRAMUSCULAR | Status: AC
  Administered 2013-12-18: 10 mg via INTRAVENOUS
  Filled 2013-12-18: qty 1

## 2013-12-18 MED ORDER — HYDROCHLOROTHIAZIDE 25 MG PO TABS
25.0000 mg | ORAL_TABLET | Freq: Every day | ORAL | Status: DC
  Administered 2013-12-18 – 2013-12-26 (×8): 25 mg via ORAL
  Filled 2013-12-18 (×9): qty 1

## 2013-12-18 MED ORDER — EPHEDRINE SULFATE 50 MG/ML IJ SOLN
INTRAMUSCULAR | Status: AC
  Filled 2013-12-18: qty 1

## 2013-12-18 MED ORDER — THROMBIN 20000 UNITS EX SOLR
CUTANEOUS | Status: DC | PRN
  Administered 2013-12-18: 09:00:00 via TOPICAL

## 2013-12-18 MED ORDER — ONDANSETRON HCL 4 MG/2ML IJ SOLN: 4.0000 mg | Freq: Once | INTRAMUSCULAR | Status: DC | PRN

## 2013-12-18 MED ORDER — LIDOCAINE HCL (CARDIAC) 20 MG/ML IV SOLN
INTRAVENOUS | Status: AC
Start: 2013-12-18 — End: 2013-12-18
  Filled 2013-12-18: qty 5

## 2013-12-18 MED ORDER — THROMBIN 5000 UNITS EX SOLR
OROMUCOSAL | Status: DC | PRN
  Administered 2013-12-18: 09:00:00 via TOPICAL

## 2013-12-18 MED ORDER — CEFAZOLIN SODIUM 1-5 GM-% IV SOLN
1.0000 g | Freq: Three times a day (TID) | INTRAVENOUS | Status: AC
  Administered 2013-12-18 (×2): 1 g via INTRAVENOUS
  Filled 2013-12-18 (×2): qty 50

## 2013-12-18 MED ORDER — OXYCODONE HCL 5 MG/5ML PO SOLN: 5.0000 mg | Freq: Once | ORAL | Status: AC | PRN

## 2013-12-18 MED ORDER — QUINAPRIL-HYDROCHLOROTHIAZIDE 20-25 MG PO TABS: 1.0000 | ORAL_TABLET | Freq: Every day | ORAL | Status: DC

## 2013-12-18 MED ORDER — SENNA 8.6 MG PO TABS
1.0000 | ORAL_TABLET | Freq: Two times a day (BID) | ORAL | Status: DC
  Administered 2013-12-18 – 2013-12-26 (×15): 8.6 mg via ORAL
  Filled 2013-12-18 (×17): qty 1

## 2013-12-18 MED ORDER — OXYCODONE-ACETAMINOPHEN 10-325 MG PO TABS: 2.0000 | ORAL_TABLET | Freq: Four times a day (QID) | ORAL | Status: DC | PRN

## 2013-12-18 MED ORDER — HYDROMORPHONE HCL PF 1 MG/ML IJ SOLN
INTRAMUSCULAR | Status: AC
  Administered 2013-12-18: 0.5 mg
  Filled 2013-12-18: qty 1

## 2013-12-18 MED ORDER — LACTATED RINGERS IV SOLN
INTRAVENOUS | Status: DC | PRN
  Administered 2013-12-18 (×5): via INTRAVENOUS

## 2013-12-18 MED ORDER — OXYCODONE HCL 5 MG PO TABS
5.0000 mg | ORAL_TABLET | Freq: Once | ORAL | Status: AC | PRN
  Administered 2013-12-18: 5 mg via ORAL

## 2013-12-18 MED ORDER — ONDANSETRON HCL 4 MG/2ML IJ SOLN
INTRAMUSCULAR | Status: AC
  Filled 2013-12-18: qty 2

## 2013-12-18 MED ORDER — MOMETASONE FURO-FORMOTEROL FUM 200-5 MCG/ACT IN AERO
2.0000 | INHALATION_SPRAY | Freq: Two times a day (BID) | RESPIRATORY_TRACT | Status: DC
  Administered 2013-12-18 – 2013-12-26 (×15): 2 via RESPIRATORY_TRACT
  Filled 2013-12-18: qty 8.8

## 2013-12-18 MED ORDER — SODIUM CHLORIDE 0.9 % IV SOLN: 250.0000 mL | INTRAVENOUS | Status: DC

## 2013-12-18 MED ORDER — FENTANYL CITRATE 0.05 MG/ML IJ SOLN
INTRAMUSCULAR | Status: AC
  Filled 2013-12-18: qty 5

## 2013-12-18 MED ORDER — ALBUTEROL SULFATE (2.5 MG/3ML) 0.083% IN NEBU: 2.5000 mg | INHALATION_SOLUTION | Freq: Four times a day (QID) | RESPIRATORY_TRACT | Status: DC | PRN

## 2013-12-18 MED ORDER — POLYETHYLENE GLYCOL 3350 17 G PO PACK
17.0000 g | PACK | Freq: Every day | ORAL | Status: DC | PRN
  Administered 2013-12-19 – 2013-12-25 (×2): 17 g via ORAL
  Filled 2013-12-18 (×2): qty 1

## 2013-12-18 MED ORDER — DIAZEPAM 5 MG PO TABS
5.0000 mg | ORAL_TABLET | Freq: Four times a day (QID) | ORAL | Status: DC | PRN
  Administered 2013-12-18 – 2013-12-19 (×4): 10 mg via ORAL
  Administered 2013-12-21 – 2013-12-24 (×3): 5 mg via ORAL
  Administered 2013-12-24: 10 mg via ORAL
  Administered 2013-12-24: 5 mg via ORAL
  Administered 2013-12-25 (×2): 10 mg via ORAL
  Administered 2013-12-26: 5 mg via ORAL
  Filled 2013-12-18: qty 2
  Filled 2013-12-18 (×2): qty 1
  Filled 2013-12-18: qty 2
  Filled 2013-12-18: qty 1
  Filled 2013-12-18 (×3): qty 2
  Filled 2013-12-18 (×2): qty 1
  Filled 2013-12-18 (×2): qty 2

## 2013-12-18 MED ORDER — SUCCINYLCHOLINE CHLORIDE 20 MG/ML IJ SOLN
INTRAMUSCULAR | Status: AC
  Filled 2013-12-18: qty 1

## 2013-12-18 MED ORDER — PROPOFOL 10 MG/ML IV BOLUS
INTRAVENOUS | Status: DC | PRN
  Administered 2013-12-18: 120 mg via INTRAVENOUS

## 2013-12-18 MED ORDER — SODIUM CHLORIDE 0.9 % IJ SOLN
INTRAMUSCULAR | Status: AC
  Filled 2013-12-18: qty 10

## 2013-12-18 MED ORDER — FLUOXETINE HCL 10 MG PO CAPS
10.0000 mg | ORAL_CAPSULE | Freq: Every day | ORAL | Status: DC
  Administered 2013-12-18 – 2013-12-26 (×9): 10 mg via ORAL
  Filled 2013-12-18 (×9): qty 1

## 2013-12-18 MED ORDER — ALBUTEROL SULFATE HFA 108 (90 BASE) MCG/ACT IN AERS: 2.0000 | INHALATION_SPRAY | Freq: Four times a day (QID) | RESPIRATORY_TRACT | Status: DC | PRN

## 2013-12-18 MED ORDER — ACETAMINOPHEN 650 MG RE SUPP
650.0000 mg | RECTAL | Status: DC | PRN
Start: 2013-12-18 — End: 2013-12-26

## 2013-12-18 MED ORDER — ONDANSETRON HCL 4 MG/2ML IJ SOLN
INTRAMUSCULAR | Status: DC | PRN
  Administered 2013-12-18: 4 mg via INTRAVENOUS

## 2013-12-18 MED ORDER — MEPERIDINE HCL 25 MG/ML IJ SOLN: 6.2500 mg | INTRAMUSCULAR | Status: DC | PRN

## 2013-12-18 MED ORDER — FLEET ENEMA 7-19 GM/118ML RE ENEM: 1.0000 | ENEMA | Freq: Once | RECTAL | Status: AC | PRN

## 2013-12-18 MED ORDER — ROCURONIUM BROMIDE 50 MG/5ML IV SOLN
INTRAVENOUS | Status: AC
  Filled 2013-12-18: qty 1

## 2013-12-18 MED ORDER — ZOLPIDEM TARTRATE 5 MG PO TABS: 5.0000 mg | ORAL_TABLET | Freq: Every evening | ORAL | Status: DC | PRN

## 2013-12-18 MED ORDER — NITROGLYCERIN 0.4 MG SL SUBL: 0.4000 mg | SUBLINGUAL_TABLET | SUBLINGUAL | Status: DC | PRN

## 2013-12-18 MED ORDER — ONDANSETRON HCL 4 MG/2ML IJ SOLN: 4.0000 mg | INTRAMUSCULAR | Status: DC | PRN

## 2013-12-18 MED ORDER — PANTOPRAZOLE SODIUM 40 MG PO TBEC
80.0000 mg | DELAYED_RELEASE_TABLET | Freq: Every day | ORAL | Status: DC
  Administered 2013-12-18 – 2013-12-19 (×2): 80 mg via ORAL
  Filled 2013-12-18 (×2): qty 2

## 2013-12-18 MED ORDER — SODIUM CHLORIDE 0.9 % IR SOLN
Status: DC | PRN
  Administered 2013-12-18: 09:00:00

## 2013-12-18 MED ORDER — MELOXICAM 15 MG PO TABS
15.0000 mg | ORAL_TABLET | Freq: Every day | ORAL | Status: DC
  Administered 2013-12-19 – 2013-12-26 (×8): 15 mg via ORAL
  Filled 2013-12-18 (×8): qty 1

## 2013-12-18 MED ORDER — GABAPENTIN 300 MG PO CAPS
600.0000 mg | ORAL_CAPSULE | Freq: Three times a day (TID) | ORAL | Status: DC
  Administered 2013-12-18 – 2013-12-26 (×24): 600 mg via ORAL
  Filled 2013-12-18 (×26): qty 2

## 2013-12-18 MED ORDER — PHENYLEPHRINE HCL 10 MG/ML IJ SOLN
10.0000 mg | INTRAVENOUS | Status: DC | PRN
  Administered 2013-12-18: 40 ug/min via INTRAVENOUS

## 2013-12-18 MED ORDER — LIDOCAINE HCL (CARDIAC) 20 MG/ML IV SOLN
INTRAVENOUS | Status: DC | PRN
  Administered 2013-12-18: 100 mg via INTRAVENOUS

## 2013-12-18 MED ORDER — OXYCODONE HCL 5 MG PO TABS
ORAL_TABLET | ORAL | Status: AC
  Filled 2013-12-18: qty 1

## 2013-12-18 MED ORDER — PROPOFOL 10 MG/ML IV BOLUS
INTRAVENOUS | Status: AC
  Filled 2013-12-18: qty 20

## 2013-12-18 MED ORDER — OXYCODONE-ACETAMINOPHEN 5-325 MG PO TABS
1.0000 | ORAL_TABLET | ORAL | Status: DC | PRN
  Administered 2013-12-18 – 2013-12-26 (×12): 2 via ORAL
  Filled 2013-12-18 (×13): qty 2

## 2013-12-18 MED ORDER — FENTANYL CITRATE 0.05 MG/ML IJ SOLN
INTRAMUSCULAR | Status: DC | PRN
  Administered 2013-12-18: 150 ug via INTRAVENOUS
  Administered 2013-12-18: 100 ug via INTRAVENOUS
  Administered 2013-12-18 (×3): 50 ug via INTRAVENOUS

## 2013-12-18 MED ORDER — HYDROCODONE-ACETAMINOPHEN 5-325 MG PO TABS
1.0000 | ORAL_TABLET | ORAL | Status: DC | PRN
Start: 2013-12-18 — End: 2013-12-26
  Administered 2013-12-18 – 2013-12-24 (×3): 2 via ORAL
  Filled 2013-12-18 (×3): qty 2

## 2013-12-18 MED ORDER — ASPIRIN EC 81 MG PO TBEC
243.0000 mg | DELAYED_RELEASE_TABLET | Freq: Every day | ORAL | Status: DC
Start: 2013-12-18 — End: 2013-12-22
  Administered 2013-12-18 – 2013-12-21 (×4): 243 mg via ORAL
  Filled 2013-12-18 (×5): qty 3

## 2013-12-18 MED ORDER — PHENOL 1.4 % MT LIQD: 1.0000 | OROMUCOSAL | Status: DC | PRN

## 2013-12-18 MED ORDER — MIDAZOLAM HCL 2 MG/2ML IJ SOLN
INTRAMUSCULAR | Status: AC
  Filled 2013-12-18: qty 2

## 2013-12-18 MED ORDER — SODIUM CHLORIDE 0.9 % IJ SOLN
3.0000 mL | INTRAMUSCULAR | Status: DC | PRN
  Administered 2013-12-22 – 2013-12-24 (×2): 3 mL via INTRAVENOUS

## 2013-12-18 MED ORDER — ALUM & MAG HYDROXIDE-SIMETH 200-200-20 MG/5ML PO SUSP
30.0000 mL | Freq: Four times a day (QID) | ORAL | Status: DC | PRN
  Administered 2013-12-19 – 2013-12-22 (×3): 30 mL via ORAL
  Filled 2013-12-18 (×3): qty 30

## 2013-12-18 MED ORDER — AMLODIPINE BESYLATE 5 MG PO TABS
5.0000 mg | ORAL_TABLET | Freq: Every day | ORAL | Status: DC
  Administered 2013-12-18 – 2013-12-26 (×8): 5 mg via ORAL
  Filled 2013-12-18 (×9): qty 1

## 2013-12-18 MED ORDER — MIDAZOLAM HCL 5 MG/5ML IJ SOLN
INTRAMUSCULAR | Status: DC | PRN
  Administered 2013-12-18: 2 mg via INTRAVENOUS

## 2013-12-18 MED ORDER — 0.9 % SODIUM CHLORIDE (POUR BTL) OPTIME
TOPICAL | Status: DC | PRN
  Administered 2013-12-18: 1000 mL

## 2013-12-18 MED ORDER — OMEGA-3-ACID ETHYL ESTERS 1 G PO CAPS
2.0000 g | ORAL_CAPSULE | Freq: Two times a day (BID) | ORAL | Status: DC
  Administered 2013-12-18 – 2013-12-26 (×15): 2 g via ORAL
  Filled 2013-12-18 (×18): qty 2

## 2013-12-18 MED ORDER — LIDOCAINE HCL (CARDIAC) 20 MG/ML IV SOLN
INTRAVENOUS | Status: AC
  Filled 2013-12-18: qty 5

## 2013-12-18 MED ORDER — HYDROMORPHONE HCL PF 1 MG/ML IJ SOLN
0.5000 mg | INTRAMUSCULAR | Status: DC | PRN
  Administered 2013-12-18 – 2013-12-23 (×19): 1 mg via INTRAVENOUS
  Administered 2013-12-23: 0.5 mg via INTRAVENOUS
  Administered 2013-12-24 – 2013-12-25 (×6): 1 mg via INTRAVENOUS
  Filled 2013-12-18 (×26): qty 1

## 2013-12-18 MED ORDER — SODIUM CHLORIDE 0.9 % IJ SOLN
3.0000 mL | Freq: Two times a day (BID) | INTRAMUSCULAR | Status: DC
  Administered 2013-12-18 – 2013-12-26 (×13): 3 mL via INTRAVENOUS

## 2013-12-18 MED ORDER — VARENICLINE TARTRATE 0.5 MG PO TABS
0.5000 mg | ORAL_TABLET | Freq: Every day | ORAL | Status: DC
  Administered 2013-12-18 – 2013-12-26 (×9): 0.5 mg via ORAL
  Filled 2013-12-18 (×9): qty 1

## 2013-12-18 MED ORDER — DEXAMETHASONE SODIUM PHOSPHATE 10 MG/ML IJ SOLN: 10.0000 mg | INTRAMUSCULAR | Status: DC

## 2013-12-18 MED ORDER — ATORVASTATIN CALCIUM 40 MG PO TABS
40.0000 mg | ORAL_TABLET | Freq: Every day | ORAL | Status: DC
  Administered 2013-12-18 – 2013-12-25 (×8): 40 mg via ORAL
  Filled 2013-12-18 (×9): qty 1

## 2013-12-18 MED ORDER — HYDROMORPHONE HCL PF 1 MG/ML IJ SOLN
INTRAMUSCULAR | Status: AC
  Administered 2013-12-18: 1 mg
  Filled 2013-12-18: qty 1

## 2013-12-18 MED ORDER — BISACODYL 10 MG RE SUPP
10.0000 mg | Freq: Every day | RECTAL | Status: DC | PRN
Start: 2013-12-18 — End: 2013-12-26
  Filled 2013-12-18 (×2): qty 1

## 2013-12-18 MED ORDER — HYDROMORPHONE HCL PF 1 MG/ML IJ SOLN
0.2500 mg | INTRAMUSCULAR | Status: DC | PRN
  Administered 2013-12-18 (×3): 0.5 mg via INTRAVENOUS

## 2013-12-18 MED ORDER — MENTHOL 3 MG MT LOZG: 1.0000 | LOZENGE | OROMUCOSAL | Status: DC | PRN

## 2013-12-18 MED ORDER — SUCCINYLCHOLINE CHLORIDE 20 MG/ML IJ SOLN
INTRAMUSCULAR | Status: DC | PRN
Start: 2013-12-18 — End: 2013-12-18
  Administered 2013-12-18: 140 mg via INTRAVENOUS

## 2013-12-18 MED ORDER — ACETAMINOPHEN 325 MG PO TABS
650.0000 mg | ORAL_TABLET | ORAL | Status: DC | PRN
Start: 2013-12-18 — End: 2013-12-26

## 2013-12-18 MED ORDER — PHENYLEPHRINE 40 MCG/ML (10ML) SYRINGE FOR IV PUSH (FOR BLOOD PRESSURE SUPPORT)
PREFILLED_SYRINGE | INTRAVENOUS | Status: AC
  Filled 2013-12-18: qty 10

## 2013-12-18 SURGICAL SUPPLY — 76 items
7.0MM ROUNT FLUTED BUR ×1 IMPLANT
ADH SKN CLS APL DERMABOND .7 (GAUZE/BANDAGES/DRESSINGS) ×3
ADH SKN CLS LQ APL DERMABOND (GAUZE/BANDAGES/DRESSINGS) ×2
APL SKNCLS STERI-STRIP NONHPOA (GAUZE/BANDAGES/DRESSINGS) ×2
APPLIER CLIP 11 MED OPEN (CLIP) ×2
APR CLP MED 11 20 MLT OPN (CLIP) ×1
BAG DECANTER FOR FLEXI CONT (MISCELLANEOUS) ×2 IMPLANT
BENZOIN TINCTURE PRP APPL 2/3 (GAUZE/BANDAGES/DRESSINGS) ×3 IMPLANT
BLADE SURG ROTATE 9660 (MISCELLANEOUS) IMPLANT
BUR MATCHSTICK NEURO 3.0 LAGG (BURR) ×2 IMPLANT
CAP END 2 TI LOCK SCREW X-CORE (Screw) IMPLANT
CLIP APPLIE 11 MED OPEN (CLIP) IMPLANT
CONT SPEC 4OZ CLIKSEAL STRL BL (MISCELLANEOUS) ×1 IMPLANT
CORDS BIPOLAR (ELECTRODE) ×1 IMPLANT
CORE TITANIUM XCORE 2 35-55MM (Neuro Prosthesis/Implant) ×1 IMPLANT
COVER BACK TABLE 24X17X13 BIG (DRAPES) IMPLANT
DERMABOND ADHESIVE PROPEN (GAUZE/BANDAGES/DRESSINGS) ×2
DERMABOND ADVANCED (GAUZE/BANDAGES/DRESSINGS) ×3
DERMABOND ADVANCED .7 DNX12 (GAUZE/BANDAGES/DRESSINGS) ×3 IMPLANT
DERMABOND ADVANCED .7 DNX6 (GAUZE/BANDAGES/DRESSINGS) IMPLANT
DRAPE C-ARM 42X72 X-RAY (DRAPES) ×2 IMPLANT
DRAPE C-ARMOR (DRAPES) ×2 IMPLANT
DRAPE LAPAROTOMY 100X72X124 (DRAPES) ×2 IMPLANT
DRAPE POUCH INSTRU U-SHP 10X18 (DRAPES) ×2 IMPLANT
DRAPE SURG 17X23 STRL (DRAPES) ×4 IMPLANT
DRSG OPSITE POSTOP 3X4 (GAUZE/BANDAGES/DRESSINGS) ×4 IMPLANT
DRSG OPSITE POSTOP 4X6 (GAUZE/BANDAGES/DRESSINGS) ×1 IMPLANT
ELECT BLADE 4.0 EZ CLEAN MEGAD (MISCELLANEOUS) ×2
ELECT REM PT RETURN 9FT ADLT (ELECTROSURGICAL) ×2
ELECTRODE BLDE 4.0 EZ CLN MEGD (MISCELLANEOUS) IMPLANT
ELECTRODE REM PT RTRN 9FT ADLT (ELECTROSURGICAL) ×1 IMPLANT
ENDCAP XCORE TI 18X22X40X0 (Cap) ×2 IMPLANT
GAUZE SPONGE 4X4 16PLY XRAY LF (GAUZE/BANDAGES/DRESSINGS) IMPLANT
GLOVE ECLIPSE 7.5 STRL STRAW (GLOVE) ×6 IMPLANT
GLOVE ECLIPSE 8.5 STRL (GLOVE) ×2 IMPLANT
GLOVE EXAM NITRILE LRG STRL (GLOVE) ×2 IMPLANT
GLOVE EXAM NITRILE MD LF STRL (GLOVE) IMPLANT
GLOVE EXAM NITRILE XL STR (GLOVE) IMPLANT
GLOVE EXAM NITRILE XS STR PU (GLOVE) IMPLANT
GLOVE INDICATOR 7.5 STRL GRN (GLOVE) ×2 IMPLANT
GOWN BRE IMP SLV AUR LG STRL (GOWN DISPOSABLE) IMPLANT
GOWN BRE IMP SLV AUR XL STRL (GOWN DISPOSABLE) IMPLANT
GOWN STRL REIN 2XL LVL4 (GOWN DISPOSABLE) IMPLANT
GOWN STRL REUS W/ TWL LRG LVL3 (GOWN DISPOSABLE) ×1 IMPLANT
GOWN STRL REUS W/ TWL XL LVL3 (GOWN DISPOSABLE) IMPLANT
GOWN STRL REUS W/TWL LRG LVL3 (GOWN DISPOSABLE) ×2
GOWN STRL REUS W/TWL XL LVL3 (GOWN DISPOSABLE) ×4
GUIDEWIRE NITINOL BEVEL TIP (WIRE) ×1 IMPLANT
KIT BASIN OR (CUSTOM PROCEDURE TRAY) ×2 IMPLANT
KIT DILATOR XLIF 5 (KITS) IMPLANT
KIT MAXCESS (KITS) ×1 IMPLANT
KIT ROOM TURNOVER OR (KITS) ×2 IMPLANT
KIT XLIF (KITS) ×1
NEEDLE HYPO 22GX1.5 SAFETY (NEEDLE) ×2 IMPLANT
NEEDLE I-PASS III (NEEDLE) ×2 IMPLANT
NS IRRIG 1000ML POUR BTL (IV SOLUTION) ×2 IMPLANT
PACK LAMINECTOMY NEURO (CUSTOM PROCEDURE TRAY) ×2 IMPLANT
PLATE TRAVERSE SZ 45 FIXED (Plate) ×1 IMPLANT
ROD PRECEPT PRE-BENT 80MM (Rod) ×1 IMPLANT
SCREW POLYAXIAL 6.5X45MM (Screw) ×1 IMPLANT
SCREW PRECEPT 6.5X40 (Screw) ×1 IMPLANT
SCREW PRECEPT SET (Screw) ×2 IMPLANT
SCREW SET ENDCAP (Screw) ×4 IMPLANT
SCREW TRAVERSE 6.5X40MM (Screw) ×4 IMPLANT
SPONGE INTESTINAL PEANUT (DISPOSABLE) ×2 IMPLANT
SPONGE LAP 4X18 X RAY DECT (DISPOSABLE) IMPLANT
STAPLER VISISTAT 35W (STAPLE) ×1 IMPLANT
STRIP CLOSURE SKIN 1/2X4 (GAUZE/BANDAGES/DRESSINGS) ×4 IMPLANT
SUT VIC AB 2-0 CT1 18 (SUTURE) ×6 IMPLANT
SUT VIC AB 3-0 SH 8-18 (SUTURE) ×5 IMPLANT
SYR 20ML ECCENTRIC (SYRINGE) ×2 IMPLANT
TAPE CLOTH 3X10 TAN LF (GAUZE/BANDAGES/DRESSINGS) ×6 IMPLANT
TOWEL OR 17X24 6PK STRL BLUE (TOWEL DISPOSABLE) ×2 IMPLANT
TOWEL OR 17X26 10 PK STRL BLUE (TOWEL DISPOSABLE) ×2 IMPLANT
TRAY FOLEY CATH 16FRSI W/METER (SET/KITS/TRAYS/PACK) ×1 IMPLANT
WATER STERILE IRR 1000ML POUR (IV SOLUTION) ×2 IMPLANT

## 2013-12-18 NOTE — Plan of Care (Signed)
Problem: Consults Goal: Diagnosis - Spinal Surgery Outcome: Completed/Met Date Met:  12/18/13 Microdiscectomy

## 2013-12-18 NOTE — OR Nursing (Signed)
Nuvasive electrodes removed from patient without incident.

## 2013-12-18 NOTE — Brief Op Note (Signed)
12/18/2013  11:43 AM  PATIENT:  Bradley Sawyer  58 y.o. male  PRE-OPERATIVE DIAGNOSIS:  compression fracture  POST-OPERATIVE DIAGNOSIS:  compression fracture  PROCEDURE:  Procedure(s): Lumbar Two Anteriorlateral Corpectomy w/ cage, interbody fusion, anterior plating, Lumbar One to Lumbar Three percutaneous pedicle screws (N/A)  SURGEON:  Surgeon(s) and Role:    * Charlie Pitter, MD - Primary    * Otilio Connors, MD - Assisting  PHYSICIAN ASSISTANT:   ASSISTANTS:    ANESTHESIA:   general  EBL:  Total I/O In: 4000 [I.V.:4000] Out: 800 [Urine:200; Blood:600]  BLOOD ADMINISTERED:none  DRAINS: none   LOCAL MEDICATIONS USED:  MARCAINE     SPECIMEN:  No Specimen  DISPOSITION OF SPECIMEN:  N/A  COUNTS:  YES  TOURNIQUET:  * No tourniquets in log *  DICTATION: .Dragon Dictation  PLAN OF CARE: Admit to inpatient   PATIENT DISPOSITION:  PACU - hemodynamically stable.   Delay start of Pharmacological VTE agent (>24hrs) due to surgical blood loss or risk of bleeding: yes

## 2013-12-18 NOTE — H&P (Signed)
Bradley Sawyer is an 58 y.o. male.   Chief Complaint: Back pain  HPI: a 58 year old male with posttraumatic L2 compression fracture with persistent pain worsening kyphotic angulation and intermittent pain radiating into his right groin. Workup demonstrates evidence of an L2 compression fracture with some retropulsion of the entire vertebral body. There is collapse of the disc spaces above L1-L2 3. There is marked foraminal stenosis right greater than left at L23. Patient's failed conservative management. He presents now for anterior lateral retroperitoneal corpectomy with strut graft fusion and lateral plating coupled with percutaneous pedicle fixation.   Past Medical History  Diagnosis Date  . Hyperlipidemia   . Hypertension   . GERD (gastroesophageal reflux disease)   . Tobacco abuse   . COPD (chronic obstructive pulmonary disease)     bronchitis  . Disorder of vocal cord   . Cocaine dependence     Last use 1998, attends 3 NA meetings weekly.  . Right shoulder pain     2/2 partial rotator cuff tear, tendinopathy, mild subacromial/subdeltioid bursitis, A/C joint arthropathy per MRI done in 3/07  . Arthritis   . Anxiety     Past Surgical History  Procedure Laterality Date  . Neck surgery      Family History  Problem Relation Age of Onset  . Coronary artery disease      Male 1st degree relatives<60  . Diabetes      1 st degree relatives   Social History:  reports that he has been smoking Cigarettes.  He has a 48 pack-year smoking history. He does not have any smokeless tobacco history on file. He reports that he uses illicit drugs (Cocaine and Marijuana). He reports that he does not drink alcohol.  Allergies: No Known Allergies  Medications Prior to Admission  Medication Sig Dispense Refill  . albuterol (PROVENTIL HFA;VENTOLIN HFA) 108 (90 BASE) MCG/ACT inhaler Inhale 2 puffs into the lungs every 6 (six) hours as needed for wheezing.  3 Inhaler  4  . amLODipine (NORVASC) 5  MG tablet Take 1 tablet (5 mg total) by mouth daily.  30 tablet  11  . aspirin EC 81 MG tablet Take 243 mg by mouth daily.      Marland Kitchen esomeprazole (NEXIUM 24HR) 20 MG capsule Take 1 capsule (20 mg total) by mouth daily before breakfast.  90 capsule  4  . FLUoxetine (PROZAC) 10 MG capsule Take 1 capsule (10 mg total) by mouth daily.  90 capsule  4  . Fluticasone-Salmeterol (ADVAIR) 500-50 MCG/DOSE AEPB Inhale 1 puff into the lungs every 12 (twelve) hours.  60 each  4  . gabapentin (NEURONTIN) 300 MG capsule Take 2 capsules (600 mg total) by mouth 3 (three) times daily.  180 capsule  4  . meloxicam (MOBIC) 15 MG tablet Take 1 tablet (15 mg total) by mouth daily.  30 tablet  1  . nitroGLYCERIN (NITROSTAT) 0.4 MG SL tablet Place 1 tablet (0.4 mg total) under the tongue every 5 (five) minutes as needed for chest pain.  25 tablet  8  . omega-3 acid ethyl esters (LOVAZA) 1 G capsule Take 2 capsules (2 g total) by mouth 2 (two) times daily.  90 capsule  3  . oxyCODONE-acetaminophen (PERCOCET) 10-325 MG per tablet Take 2 tablets by mouth every 6 (six) hours as needed for pain.  200 tablet  0  . quinapril-hydrochlorothiazide (ACCURETIC) 20-25 MG per tablet Take 1 tablet by mouth daily.  90 tablet  3  . rosuvastatin (CRESTOR)  20 MG tablet Take 1 tablet (20 mg total) by mouth at bedtime.  30 tablet  11  . varenicline (CHANTIX CONTINUING MONTH PAK) 1 MG tablet 1 tab twice daily from day 8 after finishing up the other prescription 0.5 mg tabs as instructed.  60 tablet  2  . varenicline (CHANTIX) 0.5 MG tablet 1 tab once daily for 3 days and then take 1 tab twice daily for next 4 days and then switch to 2 mg tablets as instructed.  11 tablet  0    No results found for this or any previous visit (from the past 48 hour(s)). No results found.  Review of Systems  Constitutional: Negative.   HENT: Negative.   Eyes: Negative.   Respiratory: Negative.   Cardiovascular: Negative.   Gastrointestinal: Negative.    Genitourinary: Negative.   Musculoskeletal: Negative.   Skin: Negative.   Neurological: Negative.   Endo/Heme/Allergies: Negative.   Psychiatric/Behavioral: Negative.     Blood pressure 122/80, pulse 65, temperature 97.1 F (36.2 C), temperature source Oral, resp. rate 16, SpO2 94.00%. Physical Exam  Constitutional: He is oriented to person, place, and time. He appears well-developed and well-nourished. No distress.  HENT:  Head: Normocephalic and atraumatic.  Right Ear: External ear normal.  Left Ear: External ear normal.  Nose: Nose normal.  Mouth/Throat: Oropharynx is clear and moist. No oropharyngeal exudate.  Eyes: Conjunctivae and EOM are normal. Pupils are equal, round, and reactive to light. Right eye exhibits no discharge. Left eye exhibits no discharge.  Neck: Normal range of motion. Neck supple. No tracheal deviation present. No thyromegaly present.  Cardiovascular: Normal rate, regular rhythm, normal heart sounds and intact distal pulses.  Exam reveals no friction rub.   No murmur heard. Respiratory: Effort normal and breath sounds normal. No respiratory distress. He has no wheezes.  GI: Soft. Bowel sounds are normal. He exhibits no distension. There is no tenderness.  Musculoskeletal: Normal range of motion. He exhibits no edema and no tenderness.  Neurological: He is alert and oriented to person, place, and time. He has normal reflexes. He displays normal reflexes. No cranial nerve deficit. He exhibits normal muscle tone. Coordination normal.  Skin: Skin is warm and dry. No rash noted. He is not diaphoretic. No erythema.  Psychiatric: He has a normal mood and affect. His behavior is normal. Judgment and thought content normal.     Assessment/Plan Symptomatic L2 compression fracture with kyphotic angulation and foraminal stenosis. Plan left anterior lateral retroperitoneal L2 corpectomy with L1-L3 strut graft reconstruction utilizing cage and local autograft with lateral  plating. Plan further augmentation with percutaneous L1-L3 pedicle screw fixation. Risks and benefits been explained. Patient wishes to proceed.   Shelba Susi A 12/18/2013, 7:35 AM

## 2013-12-18 NOTE — OR Nursing (Signed)
Nuvasive electrodes for nerve monitoring placed on patient at 0755.

## 2013-12-18 NOTE — Preoperative (Signed)
Beta Blockers   Reason not to administer Beta Blockers:Not Applicable 

## 2013-12-18 NOTE — Progress Notes (Signed)
Patient has been educated regarding the importance of using the IS to prevent pneumonia.  Patient has made little effort to use the IS and says "I can't do it."  Patient and family were encouraged to use it again.  Will continue to monitor.  Kizzie Bane, RN

## 2013-12-18 NOTE — Anesthesia Postprocedure Evaluation (Signed)
Anesthesia Post Note  Patient: Bradley Sawyer  Procedure(s) Performed: Procedure(s) (LRB): Lumbar Two Anteriorlateral Corpectomy w/ cage, interbody fusion, anterior plating, Lumbar One to Lumbar Three percutaneous pedicle screws (N/A)  Anesthesia type: general  Patient location: PACU  Post pain: Pain level controlled  Post assessment: Patient's Cardiovascular Status Stable  Last Vitals:  Filed Vitals:   12/18/13 1556  BP: 120/76  Pulse:   Temp:   Resp:     Post vital signs: Reviewed and stable  Level of consciousness: sedated  Complications: No apparent anesthesia complications

## 2013-12-18 NOTE — Op Note (Signed)
Date of procedure: 12/18/2013  Date of dictation: Same  Service: Neurosurgery  Preoperative diagnosis: L2 compression fracture with intractable back pain and radiculopathy secondary to kyphotic angulation and foraminal collapse.   Postoperative diagnosis: Same  Procedure Name: Left L2 anterior lateral/retroperitoneal corpectomy with strut graft anterior cage fusion from L1-L3 utilizing locally harvested autograft and anterior lateral plate instrumentation.  L1-L3 nonsegmental pedicle screw fixation utilizing percutaneous pedicle screw technique  Surgeon:Chaya Dehaan A.Augusten Lipkin, M.D.  Asst. Surgeon: Luiz Ochoa  Anesthesia: General  Indication: Patient is a 58 year old male status post L2 fracture with intractable back pain and radiation into his right groin failing all conservative management. 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 Patient has failed conservative management. He presents now for anterior reconstruction with posterior percutaneous fixation.  Operative note: After induction of anesthesia, patient positioned in the right lateral decubitus position and her purply padded. Planning fluoroscopy was used and the patient was positioned in a laterally flexed position and secured to the bed. Patient's left abdomen flank and lumbar region were prepped and draped sterilely. Incision made over the L2 body. A secondary incision was made in the left posterior flank. Using blunt dissection access was gained into the retroperitoneal space. Paratenon was swept forward and introducer was passed into the retroperitoneal space and Dr. into the lateral body of L2 under fluoroscopic guidance. This was sequentially dilated. Intraoperative neural monitoring was used throughout. There is no evidence of adjacent neural structures. Self-retaining retractor was placed. The psoas muscle was split and retracted off the body of L2 and the disc space of  L1-L2 3 was exposed. Direct stimulation overlying these regions yielded no evidence of adjacent neural structures. Segmental artery and vein overlying L2 was dissected free coagulated and divided. Disc space at L1-2 and L2-3 were then incised with a 15 blade. Discectomy was then performed at both levels using various curettes and scraping instruments. Disc material was removed using pituitary rongeurs. The body of L2 was then removed in a piecemeal fashion using various biting curettes biting rongeurs and a high-speed drill. Bone was saved for later use in autographing. After the great majority of the L2 vertebral body had been removed the corpectomy defect was inspected and found to be adequate for insertion of the cage. The posterior wall of L2 was not resected as it was not grossly violating the spinal canal. This was left in place for additional support and bone growth and 40 x 22 mm in plates using theNuvasive cage were attached to a 42 mm body. This was packed with morselized autograft harvested from the corpectomy. This is impacted into place and found to be in good position in both AP and lateral fluoroscopic views. The cage was distracted and secured. A 45 mm traverse lateral plate was placed over the vertebral bodies of  L1 and L3. This is attached under fluoroscopic guidance using 6.5 mm screws into the bodies of L1 and L3. Screws were placed in a unicortical manner only. Final images revealed good position of the cage and hardware with improved alignment of the spine and no obvious complications. The patient's bed was returned to neutral position. Incisions were made overlying the pedicle of L1 and L3. Jamshidi needles were then passed into the proximal pedicle of L1 and L3. Neural monitoring was used during pedicle probing. The Jamshidi was then passed through the pedicle into the vertebral bodies of L1 and L3 on the left side. Guidewires were placed. Each pedicle was then tapped  with a screw tap and  then 6.5 mm pedicle screws were passed into the bodies of L1 and L3. A titanium rod was then passed percutaneously from L1-L3 and then locked into place. Applying instruments were removed. Final images in both the AP and lateral planes revealed good positioning of both the anterior and posterior is mentation with normal lamina spine. All wounds were then irrigated and closed in typical fashion. Steri-Strips and sterile dressing were applied. There were no apparent outpatient.

## 2013-12-18 NOTE — Progress Notes (Signed)
Pt rates his pain 8/10 when awakened to ask about pain. Dr Conrad [place] aware. OK to tx to rm. Sign out requested.

## 2013-12-18 NOTE — Telephone Encounter (Signed)
Patient had surgery today with Dr. Annette Stable for anterior lateral lumbar fusion.  Patient has Medicaid and our office is listed on card.  [place] calling to get an NPI # to authorize for surgery.  NPI # given and patient will need to contact DSS to have card changed to correct PCP.  Nolene Ebbs, RN

## 2013-12-18 NOTE — Anesthesia Preprocedure Evaluation (Addendum)
Anesthesia Evaluation  Patient identified by MRN, date of birth, ID band Patient awake    Reviewed: Allergy & Precautions, H&P , NPO status , Patient's Chart, lab work & pertinent test results  Airway Mallampati: I TM Distance: >3 FB Neck ROM: Full    Dental  (+) Edentulous Upper and Edentulous Lower   Pulmonary COPD COPD inhaler, Current Smoker,          Cardiovascular hypertension, Pt. on medications     Neuro/Psych Anxiety  Neuromuscular disease    GI/Hepatic GERD-  Medicated and Controlled,  Endo/Other    Renal/GU      Musculoskeletal   Abdominal   Peds  Hematology   Anesthesia Other Findings   Reproductive/Obstetrics                          Anesthesia Physical Anesthesia Plan  ASA: II  Anesthesia Plan: General   Post-op Pain Management:    Induction: Intravenous  Airway Management Planned: Oral ETT  Additional Equipment:   Intra-op Plan:   Post-operative Plan: Extubation in OR  Informed Consent: I have reviewed the patients History and Physical, chart, labs and discussed the procedure including the risks, benefits and alternatives for the proposed anesthesia with the patient or authorized representative who has indicated his/her understanding and acceptance.   Dental advisory given  Plan Discussed with: CRNA, Surgeon and Anesthesiologist  Anesthesia Plan Comments:        Anesthesia Quick Evaluation

## 2013-12-18 NOTE — Progress Notes (Signed)
UR complete.  Zamoria Boss RN, MSN 

## 2013-12-18 NOTE — Transfer of Care (Signed)
Immediate Anesthesia Transfer of Care Note  Patient: Bradley Sawyer  Procedure(s) Performed: Procedure(s): Lumbar Two Anteriorlateral Corpectomy w/ cage, interbody fusion, anterior plating, Lumbar One to Lumbar Three percutaneous pedicle screws (N/A)  Patient Location: PACU  Anesthesia Type:General  Level of Consciousness: patient cooperative  Airway & Oxygen Therapy: Patient Spontanous Breathing and Patient connected to nasal cannula oxygen  Post-op Assessment: Report given to PACU RN, Post -op Vital signs reviewed and stable and Patient moving all extremities X 4  Post vital signs: Reviewed and stable  Complications: No apparent anesthesia complications

## 2013-12-19 MED ORDER — NON FORMULARY: 40.0000 mg | Freq: Every morning | Status: DC

## 2013-12-19 MED ORDER — OXYCODONE HCL 5 MG PO TABS
15.0000 mg | ORAL_TABLET | ORAL | Status: DC | PRN
  Administered 2013-12-19 – 2013-12-21 (×7): 30 mg via ORAL
  Administered 2013-12-21: 15 mg via ORAL
  Administered 2013-12-21: 30 mg via ORAL
  Administered 2013-12-21: 15 mg via ORAL
  Administered 2013-12-21: 30 mg via ORAL
  Administered 2013-12-22 (×4): 15 mg via ORAL
  Administered 2013-12-23: 30 mg via ORAL
  Administered 2013-12-23: 15 mg via ORAL
  Administered 2013-12-23: 20 mg via ORAL
  Administered 2013-12-23 (×2): 15 mg via ORAL
  Administered 2013-12-24: 20 mg via ORAL
  Administered 2013-12-24 – 2013-12-26 (×7): 30 mg via ORAL
  Filled 2013-12-19: qty 6
  Filled 2013-12-19 (×2): qty 3
  Filled 2013-12-19 (×3): qty 6
  Filled 2013-12-19: qty 3
  Filled 2013-12-19 (×2): qty 6
  Filled 2013-12-19: qty 4
  Filled 2013-12-19: qty 6
  Filled 2013-12-19: qty 3
  Filled 2013-12-19: qty 6
  Filled 2013-12-19 (×3): qty 3
  Filled 2013-12-19: qty 6
  Filled 2013-12-19: qty 3
  Filled 2013-12-19 (×4): qty 6
  Filled 2013-12-19: qty 3
  Filled 2013-12-19: qty 4
  Filled 2013-12-19 (×6): qty 6

## 2013-12-19 MED ORDER — ESOMEPRAZOLE MAGNESIUM 20 MG PO CPDR
20.0000 mg | DELAYED_RELEASE_CAPSULE | Freq: Every day | ORAL | Status: DC
  Administered 2013-12-19 – 2013-12-25 (×7): 20 mg via ORAL
  Filled 2013-12-19 (×8): qty 1

## 2013-12-19 MED ORDER — NICOTINE 21 MG/24HR TD PT24
21.0000 mg | MEDICATED_PATCH | Freq: Every day | TRANSDERMAL | Status: DC
  Administered 2013-12-19 – 2013-12-26 (×8): 21 mg via TRANSDERMAL
  Filled 2013-12-19 (×8): qty 1

## 2013-12-19 NOTE — Evaluation (Addendum)
Occupational Therapy Evaluation Patient Details Name: Bradley Sawyer MRN: [id number] DOB: 07/16/1956 Today's Date: 12/19/2013 Time: 9480-1655 OT Time Calculation (min): 25 min  OT Assessment / Plan / Recommendation History of present illness Patient is a 58 year old male status post L2 fracture with intractable back pain and radiation into his right groin failing all conservative management. 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 Patient has failed conservative management. He presents now for anterior reconstruction with posterior percutaneous fixation.   Clinical Impression   Pt presents with below problem list. Pt independent with ADLs, PTA. Feel pt will benefit from acute OT to increase independence prior to d/c.     OT Assessment  Patient needs continued OT Services    Follow Up Recommendations  No OT follow up;Supervision - Intermittent (when OOB/mobility)    Barriers to Discharge      Equipment Recommendations  Other (comment) (AE kit; shower equipment tbd)    Recommendations for Other Services    Frequency  Min 2X/week    Precautions / Restrictions Precautions Precautions: Back Precaution Booklet Issued:  (given by PT) Precaution Comments: Reviewed precautions with pt. Required Braces or Orthoses: Spinal Brace Spinal Brace: Lumbar corset;Applied in sitting position Restrictions Weight Bearing Restrictions: No   Pertinent Vitals/Pain Pain 9/10. Increased activity during session. Nurse brought meds towards beginning of session.     ADL  Grooming: Teeth care;Brushing hair;Min guard Where Assessed - Grooming: Supported standing Upper Body Dressing: Set up;Supervision/safety Where Assessed - Upper Body Dressing: Unsupported sitting Lower Body Dressing: Minimal assistance (with AE) Where Assessed - Lower Body Dressing: Unsupported sit to stand Toilet Transfer: Min Psychiatric nurse Method: Sit  to Loss adjuster, chartered: Radiographer, therapeutic Method: Not assessed Equipment Used: Back brace;Gait belt;Reacher;Long-handled sponge;Long-handled shoe horn;Sock aid Transfers/Ambulation Related to ADLs: Min guard ADL Comments: Educated on AE for LB ADLs. Performed grooming at sink-cues for precautions. Pt practiced with sockaid.  Educated on using two cups for teeth care to help maintain precautions. Educated on use of toilet aid for hygiene.    OT Diagnosis: Acute pain  OT Problem List: Decreased activity tolerance;Impaired balance (sitting and/or standing);Decreased knowledge of use of DME or AE;Decreased knowledge of precautions;Pain OT Treatment Interventions: Self-care/ADL training;DME and/or AE instruction;Therapeutic activities;Patient/family education;Balance training   OT Goals(Current goals can be found in the care plan section) Acute Rehab OT Goals Patient Stated Goal: not stated OT Goal Formulation: With patient Time For Goal Achievement: 12/26/13 Potential to Achieve Goals: Good ADL Goals Pt Will Perform Lower Body Bathing: with modified independence;with adaptive equipment;sit to/from stand Pt Will Perform Lower Body Dressing: with modified independence;with adaptive equipment;sit to/from stand Pt Will Transfer to Toilet: with modified independence;ambulating (3 in 1 over commode) Pt Will Perform Toileting - Clothing Manipulation and hygiene: with modified independence;sit to/from stand Pt Will Perform Tub/Shower Transfer: Tub transfer;with supervision;ambulating;rolling walker;shower seat Additional ADL Goal #1: Pt will independently verbalize and demonstrate 3/3 back precautions.  Visit Information  Last OT Received On: 12/19/13 Assistance Needed: +1 History of Present Illness: Patient is a 58 year old male status post L2 fracture with intractable back pain and radiation into his right groin failing all conservative management. 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 Patient has failed conservative management. He presents now for anterior reconstruction with posterior percutaneous fixation.       Prior Functioning     Home Living Family/patient expects to be discharged to:: Private  residence Living Arrangements: Other (Comment) (discharging with brother) Available Help at Discharge: Family;Available PRN/intermittently Type of Home: House Home Access: Level entry Home Layout: One level Home Equipment: Bedside commode Additional Comments: brother has 3 in 1 commode  Prior Function Level of Independence: Independent Comments: pt drives and was fully independent prior to surgery Communication Communication: No difficulties Dominant Hand: Right         Vision/Perception     Cognition  Cognition Arousal/Alertness: Awake/alert Behavior During Therapy: WFL for tasks assessed/performed Overall Cognitive Status: Within Functional Limits for tasks assessed    Extremity/Trunk Assessment Upper Extremity Assessment Upper Extremity Assessment: Overall WFL for tasks assessed Lower Extremity Assessment Lower Extremity Assessment: Defer to PT evaluation Cervical / Trunk Assessment Cervical / Trunk Assessment: Normal     Mobility Bed Mobility Overal bed mobility: Needs Assistance Bed Mobility: Rolling;Sit to Sidelying;Sidelying to Sit Rolling: Min guard Sidelying to sit: Min guard Sit to sidelying: Min guard General bed mobility comments: Cues for technique. Transfers Overall transfer level: Needs assistance Equipment used: None Transfers: Sit to/from Stand Sit to Stand: Min guard General transfer comment: Min guard for safety. Cues for technique.     Exercise        End of Session OT - End of Session Equipment Utilized During Treatment: Gait belt;Back brace Activity Tolerance: Patient tolerated treatment well Patient left: in bed;with call  bell/phone within reach Nurse Communication: Other (comment) (wants inhaler)  GO     Benito Mccreedy OTR/L [phone] 12/19/2013, 12:39 PM

## 2013-12-19 NOTE — Progress Notes (Signed)
Postop day 1. Pain well controlled this morning. No radicular pain. Groin pain which was preop has resolved. Patient up ambulating with therapy today. Overall looks good.  Afebrile. Vitals are stable. Wound dressings clean and dry. Motor and sensory function intact.  Overall doing well. Continue efforts at slow mobilization. Possible discharge home tomorrow

## 2013-12-19 NOTE — Progress Notes (Signed)
Patient says the IV dilaudid is not effective and is asking for IV morphine.  Paged Dr. Annette Stable and awaiting response.  Kizzie Bane, RN

## 2013-12-19 NOTE — Evaluation (Signed)
Physical Therapy Evaluation Patient Details Name: KHALEEL BECKOM MRN: [id number] DOB: Feb 06, 1956 Today's Date: 12/19/2013 Time: 1478-2956 PT Time Calculation (min): 16 min  PT Assessment / Plan / Recommendation History of Present Illness  Patient is a 58 year old male status post L2 fracture with intractable back pain and radiation into his right groin failing all conservative management. 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 Patient has failed conservative management. He presents now for anterior reconstruction with posterior percutaneous fixation.  Clinical Impression  Patient is s/p L1-L3 anterior lumbar fusion surgery resulting in the deficits listed below (see PT Problem List).  Patient will benefit from skilled PT to increase their independence and safety with mobility (while adhering to their precautions) to allow discharge home with brother. Pt highly motivated. Pt requesting to ambulate with [facility] tomorrow due to feeling unsteady today with ambulation.      PT Assessment  Patient needs continued PT services    Follow Up Recommendations  No PT follow up;Supervision - Intermittent    Does the patient have the potential to tolerate intense rehabilitation      Barriers to Discharge        Equipment Recommendations  None recommended by PT    Recommendations for Other Services OT consult   Frequency Min 5X/week    Precautions / Restrictions Precautions Precautions: Back Precaution Booklet Issued: Yes (comment) Required Braces or Orthoses: Spinal Brace Spinal Brace: Lumbar corset;Applied in sitting position Restrictions Weight Bearing Restrictions: No   Pertinent Vitals/Pain 9/10; patient repositioned for comfort       Mobility  Bed Mobility Overal bed mobility: Needs Assistance Bed Mobility: Rolling;Sidelying to Sit;Sit to Sidelying Rolling: Min guard Sidelying to sit: Supervision Sit to  sidelying: Supervision General bed mobility comments: HOB flattened to simulate home; multimodal cues for log rolling technique to adhere to back precautions  Transfers Overall transfer level: Needs assistance Equipment used: None Transfers: Sit to/from Stand Sit to Stand: Min guard General transfer comment: cues for back precautions and technique  Ambulation/Gait Ambulation/Gait assistance: Min guard Ambulation Distance (Feet): 100 Feet Assistive device: 1 person hand held assist Gait Pattern/deviations: Step-through pattern;Decreased stride length;Narrow base of support Gait velocity: decreased; guarded due to pain Gait velocity interpretation: Below normal speed for age/gender General Gait Details: pt with UE support PRN throughout ambulation; pt very guarded due to pain; cues for back precautions during turns          PT Diagnosis: Difficulty walking;Acute pain  PT Problem List: Decreased activity tolerance;Decreased balance;Decreased mobility;Decreased knowledge of use of DME;Decreased knowledge of precautions;Pain PT Treatment Interventions: DME instruction;Gait training;Functional mobility training;Therapeutic activities;Therapeutic exercise;Balance training;Neuromuscular re-education;Patient/family education     PT Goals(Current goals can be found in the care plan section) Acute Rehab PT Goals Patient Stated Goal: to go home with brother in a few days  PT Goal Formulation: With patient Time For Goal Achievement: 12/26/13 Potential to Achieve Goals: Good  Visit Information  Last PT Received On: 12/19/13 Assistance Needed: +1 History of Present Illness: Patient is a 58 year old male status post L2 fracture with intractable back pain and radiation into his right groin failing all conservative management. 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 Patient has failed conservative management. He presents now for  anterior reconstruction with posterior percutaneous fixation.       Prior [place] expects to be discharged to:: Private residence Living Arrangements: Other (Comment) (is discharging with brother )  Available Help at Discharge: Family;Available PRN/intermittently Type of Home: House Home Access: Level entry Home Layout: One level Home Equipment: Bedside commode Additional Comments: brother has 3 in 1 commode  Prior Function Level of Independence: Independent Comments: pt drives and was fully independent prior to surgery Communication Communication: No difficulties Dominant Hand: Right    Cognition  Cognition Arousal/Alertness: Awake/alert Behavior During Therapy: WFL for tasks assessed/performed Overall Cognitive Status: Within Functional Limits for tasks assessed    Extremity/Trunk Assessment Upper Extremity Assessment Upper Extremity Assessment: Defer to OT evaluation Lower Extremity Assessment Lower Extremity Assessment: Overall WFL for tasks assessed Cervical / Trunk Assessment Cervical / Trunk Assessment: Normal   Balance Balance Overall balance assessment: Needs assistance Sitting-balance support: Feet supported;No upper extremity supported Sitting balance-Leahy Scale: Good Standing balance support: During functional activity;Single extremity supported Standing balance-Leahy Scale: Fair  End of Session PT - End of Session Equipment Utilized During Treatment: Gait belt;Back brace Activity Tolerance: Patient tolerated treatment well Patient left: in bed;with call bell/phone within reach Nurse Communication: Mobility status;Precautions  GP     Gustavus Bryant, [place] 12/19/2013, 9:55 AM

## 2013-12-20 ENCOUNTER — Encounter (HOSPITAL_COMMUNITY): Payer: Self-pay | Admitting: Neurosurgery

## 2013-12-20 MED ORDER — BISACODYL 10 MG RE SUPP
10.0000 mg | Freq: Every day | RECTAL | Status: DC
  Administered 2013-12-20 – 2013-12-25 (×4): 10 mg via RECTAL
  Filled 2013-12-20 (×2): qty 1

## 2013-12-20 NOTE — Progress Notes (Signed)
Postop day 2. Pain recently well-controlled. Mostly just incisional discomfort. No lower extremity symptoms. No complaints of numbness or weakness. Patient's been up ambulating doing reasonably well. He is having some abdominal distention and reports no flatus.  Afebrile. Vital stable. Urine output Good. Awake and alert. Oriented and appropriate. Motor and sensory function intact. Abdomen distended. Hypoactive bowel sounds. Chest normal.  Overall doing reasonably well. Abdominal exam worrisome for early ileus. We'll try Dulcolax for rectal stimulation. Continue efforts at mobilization. Try to use as a little narcotic medication as possible.

## 2013-12-20 NOTE — Progress Notes (Signed)
Occupational Therapy Treatment Patient Details Name: ORVIN NETTER MRN: [id number] DOB: [birth year] Today's Date: 12/20/2013 Time: 9417-4081 OT Time Calculation (min): 32 min  OT Assessment / Plan / Recommendation  History of present illness Patient is a 58 year old male status post L2 fracture with intractable back pain and radiation into his right groin failing all conservative management. 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 Patient has failed conservative management. He presents now for anterior reconstruction with posterior percutaneous fixation.   OT comments  Pt participated in ADL retraining session today. Requires Min A for use of A/E & LB ADL's. He plans to d/c home w/ brother/sister-in-law PRN assist. Issued A/E kit today and insructted pt in use, care and precautions related to back surgery. Pt verbalized understanding of this.    Follow Up Recommendations  No OT follow up;Supervision - Intermittent    Barriers to Discharge       Equipment Recommendations  Other (comment) Warden/ranger TBD)    Recommendations for Other Services    Frequency Min 2X/week   Progress towards OT Goals Progress towards OT goals: Progressing toward goals  Plan Discharge plan remains appropriate    Precautions / Restrictions Precautions Precautions: Back Precaution Booklet Issued: Yes (comment) Precaution Comments: Reviewed precautions with pt. Required Braces or Orthoses: Spinal Brace Spinal Brace: Lumbar corset;Applied in sitting position Restrictions Weight Bearing Restrictions: No   Pertinent Vitals/Pain 1-2/10 low back pain. Pt had been pre-medicated prior to OT session, he appeared groggy/lethargic. Suspect due to medications. Resting in bed, RN staff aware.    ADL  Grooming: Performed;Min guard;Wash/dry hands Where Assessed - Grooming: Supported standing Lower Body Dressing: Performed;Minimal  assistance;Other (comment) (W/ A/E, issued A/E kit 2* Medicaid & reviewed use, care & precaution w/ pt.) Where Assessed - Lower Body Dressing: Supported sit to stand Toilet Transfer: Performed;Min Psychiatric nurse Method: Sit to Loss adjuster, chartered: Raised toilet seat with arms (or 3-in-1 over toilet) Toileting - Clothing Manipulation and Hygiene: Performed;Min guard Where Assessed - Best boy and Hygiene: Sit to stand from 3-in-1 or toilet Tub/Shower Transfer Method: Not assessed Equipment Used: Back brace;Gait belt;Reacher;Long-handled sponge;Long-handled shoe horn;Sock aid Transfers/Ambulation Related to ADLs: Min guard overall, pt reports having taken pain medication prior to OT and states "I feel like I'm high" at end of session. ADL Comments: Educated on AE for LB ADLs. Handout issued and reviewed with pt. Performed toileting/transfers and grooming at sink-cues for precautions. Pt practiced with LH reacher, sockaid, reviewed use of shoehorn and LH sponge w/ back precautions. Issued A/E kit for pt to take home - pt is aware of this.    OT Diagnosis:    OT Problem List:   OT Treatment Interventions:     OT Goals(current goals can now be found in the care plan section) Acute Rehab OT Goals Patient Stated Goal: Home w/ family (brother/sister-in-law)  Visit Information  Last OT Received On: 12/20/13 Assistance Needed: +1 History of Present Illness: Patient is a 58 year old male status post L2 fracture with intractable back pain and radiation into his right groin failing all conservative management. 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 Patient has failed conservative management. He presents now for anterior reconstruction with posterior percutaneous fixation.    Subjective Data      Prior Functioning       Cognition  Cognition Arousal/Alertness: Lethargic;Suspect due to  medications Behavior During Therapy: [facility] for tasks assessed/performed  Overall Cognitive Status: Within Functional Limits for tasks assessed    Mobility  Bed Mobility Overal bed mobility: Needs Assistance Bed Mobility: Rolling;Sit to Sidelying;Sidelying to Sit Rolling: Min guard Sidelying to sit: Supervision Sit to sidelying: Min guard General bed mobility comments: Cues for technique. Transfers Overall transfer level: Needs assistance Equipment used: Rolling walker (2 wheeled) Transfers: Sit to/from Stand Sit to Stand: Min guard General transfer comment: Min guard for safety. Cues for technique.    Exercises      Balance Balance Overall balance assessment: Needs assistance Sitting-balance support: No upper extremity supported;Feet supported Sitting balance-Leahy Scale: Good Standing balance support: Single extremity supported;During functional activity Standing balance-Leahy Scale: Fair  End of Session OT - End of Session Equipment Utilized During Treatment: Rolling walker;Back brace Activity Tolerance: Patient limited by lethargy;Patient tolerated treatment well Patient left: in bed;with call bell/phone within reach;with bed alarm set Nurse Communication: Mobility status;Other (comment) (In bed resting)  GO     Carlynn Herald, Atheena Spano Beth Dixon 12/20/2013, 9:41 AM

## 2013-12-20 NOTE — Progress Notes (Signed)
Physical Therapy Treatment Patient Details Name: Bradley Sawyer MRN: [id number] DOB: 01-12-1956 Today's Date: 12/20/2013 Time: 5621-3086 PT Time Calculation (min): 23 min  PT Assessment / Plan / Recommendation  History of Present Illness Patient is a 58 year old male status post L2 fracture with intractable back pain and radiation into his right groin failing all conservative management. 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 Patient has failed conservative management. He presents now for anterior reconstruction with posterior percutaneous fixation.   PT Comments   Patient with increased pain and soreness today. Did not attempt to use cane today per patient request. Needs cues for safety and use of RW.   Follow Up Recommendations  No PT follow up;Supervision - Intermittent     Does the patient have the potential to tolerate intense rehabilitation     Barriers to Discharge        Equipment Recommendations  None recommended by PT    Recommendations for Other Services    Frequency Min 5X/week   Progress towards PT Goals Progress towards PT goals: Progressing toward goals  Plan Current plan remains appropriate    Precautions / Restrictions Precautions Precautions: Back Precaution Booklet Issued: Yes (comment) Precaution Comments: Reviewed precautions with pt. Required Braces or Orthoses: Spinal Brace Spinal Brace: Lumbar corset;Applied in sitting position Restrictions Weight Bearing Restrictions: No   Pertinent Vitals/Pain 10/10 back pain. Patient premedicated    Mobility  Bed Mobility Overal bed mobility: Needs Assistance Bed Mobility: Rolling;Sit to Sidelying;Sidelying to Sit Rolling: Min guard Sidelying to sit: Supervision Sit to sidelying: Min guard General bed mobility comments: Cues for technique to ensure log roll technique Transfers Overall transfer level: Needs assistance Equipment used:  Rolling walker (2 wheeled) Transfers: Sit to/from Stand Sit to Stand: Min guard General transfer comment: Min guard for safety. Cues for technique. Patient does not use hands at all to stand Ambulation/Gait Ambulation/Gait assistance: Min guard Ambulation Distance (Feet): 120 Feet Assistive device: Rolling walker (2 wheeled) Gait velocity: decreased; guarded due to pain Gait velocity interpretation: Below normal speed for age/gender General Gait Details: Cues for upright posture. Patient having difficulty manuver around objects with RW    Exercises     PT Diagnosis:    PT Problem List:   PT Treatment Interventions:     PT Goals (current goals can now be found in the care plan section) Acute Rehab PT Goals Patient Stated Goal: Home w/ family (brother/sister-in-law)  Visit Information  Last PT Received On: 12/20/13 Assistance Needed: +1 History of Present Illness: Patient is a 58 year old male status post L2 fracture with intractable back pain and radiation into his right groin failing all conservative management. 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 Patient has failed conservative management. He presents now for anterior reconstruction with posterior percutaneous fixation.    Subjective Data  Patient Stated Goal: Home w/ family (brother/sister-in-law)   Cognition  Cognition Arousal/Alertness: Lethargic;Suspect due to medications Behavior During Therapy: WFL for tasks assessed/performed Overall Cognitive Status: Within Functional Limits for tasks assessed    Balance  Balance Overall balance assessment: Needs assistance Sitting-balance support: No upper extremity supported;Feet supported Sitting balance-Leahy Scale: Good Standing balance support: Single extremity supported;During functional activity Standing balance-Leahy Scale: Fair  End of Session PT - End of Session Equipment Utilized During  Treatment: Gait belt;Back brace Activity Tolerance: Patient tolerated treatment well;Patient limited by fatigue Patient left: in bed;with call bell/phone within reach Nurse Communication: Mobility  status;Precautions   GP     Jacqualyn Posey 12/20/2013, 12:32 PM 12/20/2013 Jacqualyn Posey PTA [phone] pager [phone] office

## 2013-12-21 ENCOUNTER — Inpatient Hospital Stay (HOSPITAL_COMMUNITY): Payer: Medicaid Other

## 2013-12-21 DIAGNOSIS — G8190 Hemiplegia, unspecified affecting unspecified side: Secondary | ICD-10-CM

## 2013-12-21 MED ORDER — DIAZEPAM 5 MG PO TABS: 5.0000 mg | ORAL_TABLET | Freq: Four times a day (QID) | ORAL | Status: DC | PRN

## 2013-12-21 MED ORDER — ASPIRIN 300 MG RE SUPP
300.0000 mg | Freq: Every day | RECTAL | Status: DC
  Administered 2013-12-21: 300 mg via RECTAL
  Filled 2013-12-21 (×2): qty 1

## 2013-12-21 MED ORDER — OXYCODONE-ACETAMINOPHEN 10-325 MG PO TABS: 1.0000 | ORAL_TABLET | ORAL | Status: DC | PRN

## 2013-12-21 MED ORDER — SODIUM CHLORIDE 0.9 % IV BOLUS (SEPSIS)
250.0000 mL | Freq: Once | INTRAVENOUS | Status: AC
  Administered 2013-12-21: 250 mL via INTRAVENOUS

## 2013-12-21 NOTE — Discharge Summary (Addendum)
Physician Discharge Summary  Patient ID: Bradley Sawyer MRN: [id number] DOB/AGE: 58/18/1957 58 y.o.  Admit date: 12/18/2013 Discharge date: 12/21/2013  Admission Diagnoses:  Discharge Diagnoses:  Principal Problem:   Lumbar compression fracture   Discharged Condition: good  Hospital Course: Patient admitted to the hospital where he underwent uncomplicated L2 corpectomy with L1-L3 fusion. Postoperatively he is doing very well. He is up ambulating with minimal difficulty. His pain is well controlled. Bowels are working. Ready for discharge home.  Consults:   Significant Diagnostic Studies:   Treatments:   Discharge Exam: Blood pressure 134/68, pulse 76, temperature 97.6 F (36.4 C), temperature source Oral, resp. rate 20, height 5\' 11"  (1.803 m), weight 104.8 kg (231 lb 0.7 oz), SpO2 94.00%. Awake and alert. Oriented and appropriate. Motor and sensory function intact. Wounds clean and dry. Chest and abdomen benign.   Disposition: 01-Home or Self Care   Future Appointments Provider Department Dept Phone   01/01/2014 8:45 AM Marrion Coy, MD [place] [phone]       Medication List         albuterol 108 (90 BASE) MCG/ACT inhaler  Commonly known as:  PROVENTIL HFA;VENTOLIN HFA  Inhale 2 puffs into the lungs every 6 (six) hours as needed for wheezing.     amLODipine 5 MG tablet  Commonly known as:  NORVASC  Take 1 tablet (5 mg total) by mouth daily.     aspirin EC 81 MG tablet  Take 243 mg by mouth daily.     diazepam 5 MG tablet  Commonly known as:  VALIUM  Take 1-2 tablets (5-10 mg total) by mouth every 6 (six) hours as needed for muscle spasms.     esomeprazole 20 MG capsule  Commonly known as:  NEXIUM 24HR  Take 1 capsule (20 mg total) by mouth daily before breakfast.     FLUoxetine 10 MG capsule  Commonly known as:  PROZAC  Take 1 capsule (10 mg total) by mouth daily.     Fluticasone-Salmeterol 500-50 MCG/DOSE Aepb   Commonly known as:  ADVAIR  Inhale 1 puff into the lungs every 12 (twelve) hours.     gabapentin 300 MG capsule  Commonly known as:  NEURONTIN  Take 2 capsules (600 mg total) by mouth 3 (three) times daily.     meloxicam 15 MG tablet  Commonly known as:  MOBIC  Take 1 tablet (15 mg total) by mouth daily.     nitroGLYCERIN 0.4 MG SL tablet  Commonly known as:  NITROSTAT  Place 1 tablet (0.4 mg total) under the tongue every 5 (five) minutes as needed for chest pain.     omega-3 acid ethyl esters 1 G capsule  Commonly known as:  LOVAZA  Take 2 capsules (2 g total) by mouth 2 (two) times daily.     oxyCODONE-acetaminophen 10-325 MG per tablet  Commonly known as:  PERCOCET  Take 1-2 tablets by mouth every 4 (four) hours as needed for pain.     quinapril-hydrochlorothiazide 20-25 MG per tablet  Commonly known as:  ACCURETIC  Take 1 tablet by mouth daily.     rosuvastatin 20 MG tablet  Commonly known as:  CRESTOR  Take 1 tablet (20 mg total) by mouth at bedtime.     varenicline 0.5 MG tablet  Commonly known as:  CHANTIX  1 tab once daily for 3 days and then take 1 tab twice daily for next 4 days and then switch to 2 mg tablets  as instructed.     varenicline 1 MG tablet  Commonly known as:  CHANTIX CONTINUING MONTH PAK  1 tab twice daily from day 8 after finishing up the other prescription 0.5 mg tabs as instructed.         Signed: Latiya Navia A 12/21/2013, 2:37 PM  Addendum: On the day of plan discharge the patient developed acute onset of left-sided weakness with some hemi-neglect. 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 Workup has demonstrated evidence of significant carotid artery plaque but no evidence of significant cardiac abnormality or ongoing source of emboli. Patient has been seen and evaluated by the stroke service. He's been kept on an aspirin daily. His overall symptoms have improved. He has minimal  neglect and motor refractive. Otherwise doing very well. Patient ready for discharge home.

## 2013-12-21 NOTE — Progress Notes (Signed)
Report given to [facility] on 3100. Pt is stable and only has complained of pain in which was treated with 15mg  Oxycodone. Pt made aware that he would be transferred to ICU and feels comfortable. Pt call bell has been off and on working this evening and maintenance requested to come and fix it. Call bell seems to be working but Therapist, sports reoriented pt and family to always feel free to call RN direct number or use the call bell installed on the bed. RN will continue to monitor and transfer to ICU when bed becomes available.

## 2013-12-21 NOTE — Progress Notes (Signed)
Physical Therapy Treatment Patient Details Name: Bradley Sawyer MRN: [id number] DOB: June 24, 1956 Today's Date: 12/21/2013 Time: 9629-5284 PT Time Calculation (min): 25 min  PT Assessment / Plan / Recommendation  History of Present Illness Patient is a 58 year old male status post L2 fracture with intractable back pain and radiation into his right groin failing all conservative management. 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 Patient has failed conservative management. He presents now for anterior reconstruction with posterior percutaneous fixation.   PT Comments   During session today, noted LUE weakness.  Patient having difficulty holding onto RW with LUE.  Noted Rt gaze preference during gait.  Patient drifting to left, and running into wall on left side.  Returned patient to room and called RN, informing her of symptoms. - RN to room and initiated Code Stroke.  Follow Up Recommendations  Home health PT;Supervision/Assistance - 24 hour     Does the patient have the potential to tolerate intense rehabilitation     Barriers to Discharge        Equipment Recommendations  Rolling walker with 5" wheels    Recommendations for Other Services    Frequency Min 5X/week   Progress towards PT Goals Progress towards PT goals: Not progressing toward goals - comment (Change in status during session with LUE weakness/neglect)  Plan Discharge plan needs to be updated;Equipment recommendations need to be updated    Precautions / Restrictions Precautions Precautions: Back Precaution Comments: Reviewed precautions with pt. Required Braces or Orthoses: Spinal Brace Spinal Brace: Lumbar corset;Applied in sitting position Restrictions Weight Bearing Restrictions: No   Pertinent Vitals/Pain     Mobility  Bed Mobility Overal bed mobility: Needs Assistance Bed Mobility: Rolling;Sidelying to Sit;Sit to Sidelying Rolling: Min  guard Sidelying to sit: Min assist Sit to sidelying: Min assist General bed mobility comments: Verbal cue reminders for technique.  Patient requiring assist to raise trunk from bed.  Once upright, patient with good balance. Transfers Overall transfer level: Needs assistance Equipment used: Rolling walker (2 wheeled) Transfers: Sit to/from Stand Sit to Stand: Min assist;From elevated surface General transfer comment: Min assist to rise to standing and for balance.  Ambulation/Gait Ambulation/Gait assistance: Mod assist Ambulation Distance (Feet): 120 Feet Assistive device: Rolling walker (2 wheeled) Gait Pattern/deviations: Step-through pattern;Decreased step length - right;Decreased step length - left;Shuffle;Staggering left;Drifts right/left (Right gaze preference) Gait velocity: decreased Gait velocity interpretation: Below normal speed for age/gender General Gait Details: Verbal cues for safe use of RW.  Patient having difficulty keeping Lt hand on handgrip of RW.  Patient with right gaze preference, running into wall on Lt.  Required mod assist for maneuvering RW and for balance/safety.  Returned to room.  RN called and informed of Lt UE weakness, Lt neglect, and decreased ambulation.      PT Goals (current goals can now be found in the care plan section)    Visit Information  Last PT Received On: 12/21/13 Assistance Needed: +1 History of Present Illness: Patient is a 58 year old male status post L2 fracture with intractable back pain and radiation into his right groin failing all conservative management. 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 Patient has failed conservative management. He presents now for anterior reconstruction with posterior percutaneous fixation.    Subjective Data  Subjective: "Something's not right"  When trying to use UE's to pull self up in bed.  Patient unable to bring LUE up to rail.  Cognition  Cognition Arousal/Alertness: Lethargic;Suspect due to medications Behavior During Therapy: [facility] for tasks assessed/performed Overall Cognitive Status: Within Functional Limits for tasks assessed    Balance  Balance Overall balance assessment: Needs assistance Standing balance support: Bilateral upper extremity supported Standing balance-Leahy Scale: Fair  End of Session PT - End of Session Equipment Utilized During Treatment: Gait belt;Back brace Activity Tolerance: Patient limited by fatigue;Patient limited by lethargy Patient left: in bed;with call bell/phone within reach;with bed alarm set;with nursing/sitter in room (Called RN to room due to symptoms noted) Nurse Communication: Mobility status;Other (comment) (Change in status - LUE weakness, Lt neglect)   GP     Despina Pole 12/21/2013, 3:45 PM Carita Pian. Sanjuana Kava, [place] Pager [phone]

## 2013-12-21 NOTE — Progress Notes (Signed)
Writer called MRI to check on stat order placed by Dr. Saintclair Halsted. Notified that patient is next on list as they were getting another patient at this time.

## 2013-12-21 NOTE — Progress Notes (Signed)
Pt able to move his bowels multiple times. Abdomen continues to appear tight, but patient states "its much better now." Bowel sounds remain active, and pt denies tenderness. Will continue to encourage fluids, and ambulation. Verdie Drown RN, BSN

## 2013-12-21 NOTE — Progress Notes (Signed)
Pt walking with PT and therapist noticed that pt started to exhibit signs of stroke. Therapist notified RN and RN came to assess pt. Pt had noticeable left side weakness and minor facial droop. Pt also had slurred speech but this has been accurate with pts baseline. Code Stroke was called and Dr. Annette Stable notified. Pt went down for CT and Dr. Aram Beecham and rapid response nurse escorted the pt along with Pt's RN. Pt passed swallow screen upon arrival and was able to eat dinner.

## 2013-12-21 NOTE — Consult Note (Signed)
NEURO HOSPITALIST CONSULT NOTE    Reason for Consult: Code stroke: left sided weakness. Referring Physician: Dr. Annette Stable    Chief Complaint: left hemiparesis  HPI:                                                                                                                                         Bradley Sawyer is an 58 y.o. male with a past medical history significant for HTN, hyperlipidemia, smoking, cocaine use ( last use 1998), COPD, arthritis, who underwent uncomplicated L2 corpectomy with L1-L3 fusion 1/27, and was noted to have left sided weakness earlier today. He was last known well at 1200 today, when he was ambulating with his physical therapist and was noted to be leaning to the left side. No reportred HA, vertigo, double vision, difficulty swallowing, slurred speech, language or vision impairment.  Initial NIHSS 6 Unenhanced CT brain showed no acute abnormality. At this moment he complains of having mild weakness of the left arm.    Date last known well:  Time last known well:  tPA Given: no, patient had major surgery 12/18/13 NIHSS: 6  MRS: 2  Past Medical History  Diagnosis Date  . Hyperlipidemia   . Hypertension   . GERD (gastroesophageal reflux disease)   . Tobacco abuse   . COPD (chronic obstructive pulmonary disease)     bronchitis  . Disorder of vocal cord   . Cocaine dependence     Last use 1998, attends 3 NA meetings weekly.  . Right shoulder pain     2/2 partial rotator cuff tear, tendinopathy, mild subacromial/subdeltioid bursitis, A/C joint arthropathy per MRI done in 3/07  . Arthritis   . Anxiety     Past Surgical History  Procedure Laterality Date  . Neck surgery    . Lumbar spine surgery  12/18/2013    L2     DR POOL  . Anterior lat lumbar fusion N/A 12/18/2013    Procedure: Lumbar Two Anteriorlateral Corpectomy w/ cage, interbody fusion, anterior plating, Lumbar One to Lumbar Three percutaneous pedicle screws;  Surgeon:  Charlie Pitter, MD;  Location: [facility];  Service: Neurosurgery;  Laterality: N/A;    Family History  Problem Relation Age of Onset  . Coronary artery disease      Male 1st degree relatives<60  . Diabetes      1 st degree relatives   Social History:  reports that he has been smoking Cigarettes.  He has a 48 pack-year smoking history. He has never used smokeless tobacco. He reports that he uses illicit drugs (Cocaine and Marijuana). He reports that he does not drink alcohol.  Allergies: No Known Allergies  Medications:  I have reviewed the patient's current medications.  ROS:                                                                                                                                       History obtained from the patient and chart review.  General ROS: negative for - chills, fatigue, fever, night sweats, weight gain or weight loss Psychological ROS: negative for - behavioral disorder, hallucinations, memory difficulties, mood swings or suicidal ideation Ophthalmic ROS: negative for - blurry vision, double vision, eye pain or loss of vision ENT ROS: negative for - epistaxis, nasal discharge, oral lesions, sore throat, tinnitus or vertigo Allergy and Immunology ROS: negative for - hives or itchy/watery eyes Hematological and Lymphatic ROS: negative for - bleeding problems, bruising or swollen lymph nodes Endocrine ROS: negative for - galactorrhea, hair pattern changes, polydipsia/polyuria or temperature intolerance Respiratory ROS: negative for - cough, hemoptysis, shortness of breath or wheezing Cardiovascular ROS: negative for - chest pain, dyspnea on exertion, edema or irregular heartbeat Gastrointestinal ROS: negative for - abdominal pain, diarrhea, hematemesis, nausea/vomiting or stool incontinence Genito-Urinary ROS: negative for - dysuria,  hematuria, incontinence or urinary frequency/urgency Musculoskeletal ROS: negative for - joint swelling Neurological ROS: as noted in HPI Dermatological ROS: negative for rash and skin lesion changes  Physical exam: pleasant male in no apparent distress. Blood pressure 120/62, pulse 76, temperature 97.9 F (36.6 C), temperature source Oral, resp. rate 20, height 5\' 11"  (1.803 m), weight 104.8 kg (231 lb 0.7 oz), SpO2 98.00%. Head: normocephalic. Neck: supple, no bruits, no JVD. Cardiac: no murmurs. Lungs: clear. Abdomen: soft, no tender, no mass. Extremities: no edema. CV: pulses palpable throughout  Neurologic Examination:                                                                                                      Mental Status: Alert, awake, oriented x 4, thought content appropriate. Comprehension, naming, and repetition intact. Speech fluent without evidence of aphasia.  Able to follow 3 step commands without difficulty. Cranial Nerves: II: Discs flat bilaterally; Visual fields grossly normal, pupils equal, round, reactive to light and accommodation III,IV, VI: ptosis not present, extra-ocular motions intact bilaterally V,VII: smile mildly asymmetric due to left nasolabial flattening, facial light touch sensation normal bilaterally VIII: hearing normal bilaterally IX,X: gag reflex present XI: bilateral shoulder shrug XII: midline tongue extension without atrophy or fasciculations Motor: Mild left hemiparesis Sensory: Pinprick and light touch slightly diminished in the left side. Deep  Tendon Reflexes:  Right: Upper Extremity   Left: Upper extremity   biceps (C-5 to C-6) 2/4   biceps (C-5 to C-6) 2/4 tricep (C7) 2/4    triceps (C7) 2/4 Brachioradialis (C6) 2/4  Brachioradialis (C6) 2/4  Lower Extremity Lower Extremity  quadriceps (L-2 to L-4) 2/4   quadriceps (L-2 to L-4) 2/4 Achilles (S1) 2/4   Achilles (S1) 2/4  Plantars: Right: downgoing   Left:  downgoing Cerebellar: Significant for mild dysmetria on the left arm.  Gait:   Unable to test   No results found for this or any previous visit (from the past 48 hour(s)). No results found.    Assessment: 58 y.o. male with acute onset left hemiparesis, subtle left face weakness noted around noon time today. NIHSS 6, unremarkable unenhanced CT brain. Suspect acute right subcortical lacunar infarct. Unfortunately, he is not a candidate for thrombolysis due to major surgery1/27/15. Suggest aspirin 81 mg daily, stroke work up.      Stroke Risk Factors - HTN, hyperlipidemia, smoking.  Plan: 1. HgbA1c, fasting lipid panel 2. MRI, MRA  of the brain without contrast 3. Echocardiogram 4. Carotid dopplers 5. Prophylactic therapy-aspirin rectally until he passes swallowing evaluation  6. Risk factor modification 7. Telemetry monitoring 8. Frequent neuro checks 9. PT/OT SLP  Dorian Pod, MD [facility] [phone]  12/21/2013, 4:25 PM

## 2013-12-21 NOTE — Progress Notes (Signed)
Patient ID: Bradley Sawyer, male   DOB: 07/13/1956, 58 y.o.   MRN: [id number] Appears neurologically stable, however in this setting of an acute CVA with a pending workup, will transfer to ICU and allow more frequent neuro checks and vitals.  MRI pending.

## 2013-12-21 NOTE — Code Documentation (Addendum)
Code stroke called at 1526.  Patient was LSN 1200/  As per PT, she had come to work with him and noticed he needed some extra helping sitting up and his left arm kept sliding off the walker.  Got and walking around in hall with PT and she noticed he had a right gaze, and kept drifting to the left while walking.  She brought him back to room and code stroke was called.  Dr Annette Stable notified and at bedside.  Patient to CT scan.  Nihss 6.  Rn to call if assistance needed

## 2013-12-21 NOTE — Discharge Instructions (Signed)

## 2013-12-21 NOTE — Progress Notes (Signed)
Patient transferred to room 3M06 from room 4N08 at this time. Alert and in stable condition. Family at side and took all belongings.

## 2013-12-22 DIAGNOSIS — I6789 Other cerebrovascular disease: Secondary | ICD-10-CM

## 2013-12-22 LAB — HEMOGLOBIN A1C
HEMOGLOBIN A1C: 5.9 % — AB (ref <5.7)
Mean Plasma Glucose: 123 mg/dL — ABNORMAL HIGH (ref <117)

## 2013-12-22 LAB — LIPID PANEL
CHOLESTEROL: 121 mg/dL (ref 0–200)
HDL: 41 mg/dL (ref >39)
LDL Cholesterol: 52 mg/dL (ref 0–99)
TRIGLYCERIDES: 142 mg/dL (ref <150)
Total CHOL/HDL Ratio: 3 RATIO
VLDL: 28 mg/dL (ref 0–40)

## 2013-12-22 MED ORDER — ASPIRIN EC 325 MG PO TBEC
325.0000 mg | DELAYED_RELEASE_TABLET | Freq: Every day | ORAL | Status: DC
Start: 2013-12-22 — End: 2013-12-26
  Administered 2013-12-23 – 2013-12-26 (×4): 325 mg via ORAL
  Filled 2013-12-22 (×4): qty 1

## 2013-12-22 MED ORDER — ASPIRIN 325 MG PO TABS
ORAL_TABLET | ORAL | Status: AC
  Administered 2013-12-22: 325 mg
  Filled 2013-12-22: qty 1

## 2013-12-22 NOTE — Progress Notes (Signed)
Subjective: Patient reports Patient feels better this morning still feels weak and left side pain seems well-controlled  Objective: Vital signs in last 24 hours: Temp:  [97.6 F (36.4 C)-98.8 F (37.1 C)] 98.5 F (36.9 C) (01/31 0800) Pulse Rate:  [68-99] 68 (01/31 0700) Resp:  [11-23] 19 (01/31 0800) BP: (64-134)/(31-106) 134/105 mmHg (01/31 0800) SpO2:  [84 %-98 %] 94 % (01/31 0800) Weight:  [99.9 kg (220 lb 3.8 oz)] 99.9 kg (220 lb 3.8 oz) (01/30 2100)  Intake/Output from previous day: 01/30 0701 - 01/31 0700 In: 840 [P.O.:840] Out: 650 [Urine:650] Intake/Output this shift: Total I/O In: -  Out: 275 [Urine:275]  Awake alert oriented significant improvement and dysarthria still with a left hemi-apraxia and subtle weakness at 4+ out of 5  Lab Results: No results found for this basename: WBC, HGB, HCT, PLT,  in the last 72 hours BMET No results found for this basename: NA, K, CL, CO2, GLUCOSE, BUN, CREATININE, CALCIUM,  in the last 72 hours  Studies/Results: Ct Head Wo Contrast  12/21/2013   CLINICAL DATA:  Left arm and leg weakness  EXAM: CT HEAD WITHOUT CONTRAST  TECHNIQUE: Contiguous axial images were obtained from the base of the skull through the vertex without intravenous contrast.  COMPARISON:  None.  FINDINGS: There is no evidence of mass effect, midline shift or extra-axial fluid collections. There is no evidence of a space-occupying lesion or intracranial hemorrhage. There is no evidence of a cortical-based area of acute infarction.  The ventricles and sulci are appropriate for the patient's age. The basal cisterns are patent.  Visualized portions of the orbits are unremarkable. There is a right maxillary sinus mucous retention cyst.  The osseous structures are unremarkable.  IMPRESSION: No acute intracranial pathology.  These results were called by telephone at the time of interpretation on 12/21/2013 at 4:27 PM to Dr. Concepcion Living [facility] , who verbally acknowledged these  results.   Electronically Signed   By: Kathreen Devoid   On: 12/21/2013 16:28   Mr Jodene Nam Head Wo Contrast  12/22/2013   CLINICAL DATA:  Recent surgery, now with left-sided weakness.  EXAM: MRI HEAD WITHOUT CONTRAST  MRA HEAD WITHOUT CONTRAST  TECHNIQUE: Multiplanar, multiecho pulse sequences of the brain and surrounding structures were obtained without intravenous contrast. Angiographic images of the head were obtained using MRA technique without contrast.  COMPARISON:  CT of the head December 21, 2013 at 1619 hr  FINDINGS: MRI HEAD FINDINGS  Two foci of right frontal lobe white matter reduced diffusion with low ADC values measure up to 8 mm, in addition to sub cm focus of reduced diffusion in the right posterior frontal lobe cortex, axial 27/62 with low ADC values. Additional punctate foci of reduced diffusion within the right parietal subcortical and deep white matter as well as right occipital lobe. Lesions show bright T2 FLAIR signal. No susceptibility artifact to suggest hemorrhage.  The ventricles and sulci are normal for patient's age. A few scattered subcentimeter supratentorial white matter FLAIR T2 hyperintensities, exclusive of the aforementioned lesions noted, within normal range for patient's age. No midline shift or mass effect.  No abnormal extra-axial fluid collections. Ocular globes and orbital contents are unremarkable though not tailored for evaluation.  Mild paranasal sinus mucosal thickening without air-fluid levels. The patient appears edentulous. Mild degenerative change of the temporomandibular joints. Trace fluid signal within the left mastoid tip. No abnormal sellar expansion. No cerebellar tonsillar ectopia. No suspicious calvarial bone marrow signal.  MRA HEAD FINDINGS  Anterior circulation: Mildly decreased intensity of the flow related enhancement of the right included cervical, petrous, cavernous and supra clinoid internal carotid arteries. The left internal carotid artery flow related  enhancement is normal. Patent anterior communicating artery. Normal flow related enhancement of the anterior and middle cerebral arteries, including more distal segments. Bilateral posterior communicating arteries present.  Posterior circulation: Left vertebral artery is dominant. Basilar artery is patent, with normal flow related enhancement of the main branch vessels. Normal flow related enhancement of the posterior cerebral arteries.  No large vessel occlusion, hemodynamically significant stenosis, abnormal luminal irregularity, aneurysm within the anterior nor posterior circulation.  IMPRESSION: MRI head: Multiple subcentimeter foci of acute to early subacute ischemia within the right frontal, parietal and occipital lobes, spanning multiple vascular territories. This could reflect embolic phenomenon.  MRA head: Less robust flow related enhancement of the right internal carotid artery which may reflect slow flow or even artifact with normal MRA of the intracranial vessels.  Critical Value/emergent results were called by telephone at the time of interpretation on 12/22/2013 at 1:50 AM to Dr. Kary Kos , who verbally acknowledged these results.   Electronically Signed   By: Elon Alas   On: 12/22/2013 01:55   Mr Brain Wo Contrast  12/22/2013   CLINICAL DATA:  Recent surgery, now with left-sided weakness.  EXAM: MRI HEAD WITHOUT CONTRAST  MRA HEAD WITHOUT CONTRAST  TECHNIQUE: Multiplanar, multiecho pulse sequences of the brain and surrounding structures were obtained without intravenous contrast. Angiographic images of the head were obtained using MRA technique without contrast.  COMPARISON:  CT of the head December 21, 2013 at 1619 hr  FINDINGS: MRI HEAD FINDINGS  Two foci of right frontal lobe white matter reduced diffusion with low ADC values measure up to 8 mm, in addition to sub cm focus of reduced diffusion in the right posterior frontal lobe cortex, axial 27/62 with low ADC values. Additional punctate  foci of reduced diffusion within the right parietal subcortical and deep white matter as well as right occipital lobe. Lesions show bright T2 FLAIR signal. No susceptibility artifact to suggest hemorrhage.  The ventricles and sulci are normal for patient's age. A few scattered subcentimeter supratentorial white matter FLAIR T2 hyperintensities, exclusive of the aforementioned lesions noted, within normal range for patient's age. No midline shift or mass effect.  No abnormal extra-axial fluid collections. Ocular globes and orbital contents are unremarkable though not tailored for evaluation.  Mild paranasal sinus mucosal thickening without air-fluid levels. The patient appears edentulous. Mild degenerative change of the temporomandibular joints. Trace fluid signal within the left mastoid tip. No abnormal sellar expansion. No cerebellar tonsillar ectopia. No suspicious calvarial bone marrow signal.  MRA HEAD FINDINGS  Anterior circulation: Mildly decreased intensity of the flow related enhancement of the right included cervical, petrous, cavernous and supra clinoid internal carotid arteries. The left internal carotid artery flow related enhancement is normal. Patent anterior communicating artery. Normal flow related enhancement of the anterior and middle cerebral arteries, including more distal segments. Bilateral posterior communicating arteries present.  Posterior circulation: Left vertebral artery is dominant. Basilar artery is patent, with normal flow related enhancement of the main branch vessels. Normal flow related enhancement of the posterior cerebral arteries.  No large vessel occlusion, hemodynamically significant stenosis, abnormal luminal irregularity, aneurysm within the anterior nor posterior circulation.  IMPRESSION: MRI head: Multiple subcentimeter foci of acute to early subacute ischemia within the right frontal, parietal and occipital lobes, spanning multiple vascular territories. This could reflect  embolic phenomenon.  MRA head: Less robust flow related enhancement of the right internal carotid artery which may reflect slow flow or even artifact with normal MRA of the intracranial vessels.  Critical Value/emergent results were called by telephone at the time of interpretation on 12/22/2013 at 1:50 AM to Dr. Kary Kos , who verbally acknowledged these results.   Electronically Signed   By: Elon Alas   On: 12/22/2013 01:55    Assessment/Plan: Continue observation in the ICU will discuss MRI results with a stroke service currently on aspirin if it's important to anticoagulate him to further it is acceptable however if he doesn't had significant benefit would prefer not to.  LOS: 4 days     Graciana Sessa P 12/22/2013, 8:21 AM

## 2013-12-22 NOTE — Progress Notes (Signed)
Physical Therapy Treatment Patient Details Name: Bradley Sawyer MRN: [id number] DOB: [birth year] Today's Date: 12/22/2013 Time: 1856-3149 PT Time Calculation (min): 29 min  PT Assessment / Plan / Recommendation  History of Present Illness Patient is a 58 year old male status post L2 fracture with intractable back pain and radiation into his right groin failing all conservative management. 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 Patient has failed conservative management. He presents now for anterior reconstruction with posterior percutaneous fixation.  On 12/21/13, patient with new Rt CVA - transferred to ICU.   PT Comments   MRI from yesterday showed Rt CVA. Patient continues with LUE weakness and Lt neglect.  Patient can state that he has had a "mini-stroke", but then denies that he is having any trouble - decreased awareness of deficits.  Patient continues to run into obstacles in hallway, and is unsafe with gait.  Modified d/c recommendations to include HHPT and 24 hour assist.  Follow Up Recommendations  Home health PT;Supervision/Assistance - 24 hour     Does the patient have the potential to tolerate intense rehabilitation     Barriers to Discharge        Equipment Recommendations  Rolling walker with 5" wheels    Recommendations for Other Services    Frequency Min 5X/week   Progress towards PT Goals Progress towards PT goals: Progressing toward goals (Current goals remain appropriate)  Plan Current plan remains appropriate    Precautions / Restrictions Precautions Precautions: Back;Fall Precaution Comments: Reviewed precautions with patient.   Required Braces or Orthoses: Spinal Brace Spinal Brace: Lumbar corset;Applied in sitting position Restrictions Weight Bearing Restrictions: No   Pertinent Vitals/Pain     Mobility  Bed Mobility Overal bed mobility: Needs Assistance Bed Mobility:  Rolling;Sidelying to Sit;Sit to Sidelying Rolling: Supervision Sidelying to sit: Supervision Sit to sidelying: Supervision General bed mobility comments: Patient requires verbal cues to use proper technique to maintain back precautions.' Transfers Overall transfer level: Needs assistance Equipment used: Rolling walker (2 wheeled) Transfers: Sit to/from Stand Sit to Stand: Min assist General transfer comment: Assist to rise from bed and for balance.  Assist for sit <> stand at toilet to control descent, to rise, and for balance.  Ambulation/Gait Ambulation/Gait assistance: Mod assist Ambulation Distance (Feet): 120 Feet Assistive device: Rolling walker (2 wheeled) Gait Pattern/deviations: Step-through pattern;Decreased step length - right;Decreased step length - left;Staggering left;Drifts right/left Gait velocity: decreased General Gait Details: Verbal cues for safe use of RW.  Continues to have difficulty keeping Lt hand on RW.  Continues to run into obstacles in hallway - Lt neglect.  Cues to turn head to left to scan environment.  Continues to have decreased balance with gait. Modified Rankin (Stroke Patients Only) Pre-Morbid Rankin Score: Moderate disability (Due to recent back surgery) Modified Rankin: Moderately severe disability      PT Goals (current goals can now be found in the care plan section)    Visit Information  Last PT Received On: 12/22/13 Assistance Needed: +1 History of Present Illness: Patient is a 58 year old male status post L2 fracture with intractable back pain and radiation into his right groin failing all conservative management. 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 Patient has failed conservative management. He presents now for anterior reconstruction with posterior percutaneous fixation.  On 12/21/13, patient with new Rt CVA - transferred to ICU.    Subjective Data  Subjective: "They  told me this morning  that I walked fine to the bathroom"  Patient denying deficits, but can state that the MD told him he had a "mini-stroke".   Cognition  Cognition Arousal/Alertness: Awake/alert Behavior During Therapy: Restless;Impulsive Overall Cognitive Status: Impaired/Different from baseline Area of Impairment: Safety/judgement;Awareness;Problem solving Safety/Judgement: Decreased awareness of deficits;Decreased awareness of safety Problem Solving: Slow processing;Difficulty sequencing;Requires verbal cues General Comments: Patient denying any problems - though can state that he had a "mini-stroke"  Recognizes that he needs help (managing underwear in bathroom), but still states he is fine.  States he "walks fine" however continues to have difficulty keeping Lt hand on RW, and running into obstacles in hallway due to Lt neglect.    Balance     End of Session PT - End of Session Equipment Utilized During Treatment: Gait belt;Back brace Activity Tolerance: Patient limited by fatigue Patient left: in bed;with call bell/phone within reach;with bed alarm set Nurse Communication: Mobility status;Patient requests pain meds   GP     Despina Pole 12/22/2013, 11:42 AM Carita Pian. Sanjuana Kava, [place] Pager [phone]

## 2013-12-22 NOTE — Progress Notes (Addendum)
Stroke Team Progress Note  HISTORY   58 y.o. male with a past medical history significant for HTN, hyperlipidemia, smoking, cocaine use ( last use 1998), COPD, arthritis, who underwent uncomplicated L2 corpectomy with L1-L3 fusion 1/27, and was noted to have left sided weakness yesterday. He was last known well at 1200 yesterday, when he was ambulating with his physical therapist and was noted to be leaning to the left side. No reportred HA, vertigo, double vision, difficulty swallowing, slurred speech, language or vision impairment. Initial NIHSS 6. Unenhanced CT brain showed no acute abnormality. Appears to have embolic strokes on  R frontal, parietal and occipital lobes.  No hx of A-fib.    SUBJECTIVE Exam significantly improved, speech close to baseline. Minor LUE drift.   OBJECTIVE Most recent Vital Signs: Filed Vitals:   12/22/13 0600 12/22/13 0700 12/22/13 0800 12/22/13 0850  BP: 100/85 130/56 134/105   Pulse: 74 68    Temp:   98.5 F (36.9 C)   TempSrc:   Oral   Resp: 21 12 19    Height:      Weight:      SpO2: 94% 95% 94% 94%   CBG (last 3)  No results found for this basename: GLUCAP,  in the last 72 hours  IV Fluid Intake:   . sodium chloride      MEDICATIONS  . amLODipine  5 mg Oral Daily  . aspirin EC  243 mg Oral Daily  . aspirin  300 mg Rectal Daily  . atorvastatin  40 mg Oral q1800  . bisacodyl  10 mg Rectal Daily  . esomeprazole  20 mg Oral Q1200  . FLUoxetine  10 mg Oral Daily  . gabapentin  600 mg Oral TID  . lisinopril  20 mg Oral Daily   And  . hydrochlorothiazide  25 mg Oral Daily  . meloxicam  15 mg Oral Daily  . mometasone-formoterol  2 puff Inhalation BID  . nicotine  21 mg Transdermal Daily  . omega-3 acid ethyl esters  2 g Oral BID  . senna  1 tablet Oral BID  . sodium chloride  3 mL Intravenous Q12H  . varenicline  0.5 mg Oral Daily   PRN:  acetaminophen, acetaminophen, albuterol, alum & mag hydroxide-simeth, bisacodyl, diazepam,  HYDROcodone-acetaminophen, HYDROmorphone (DILAUDID) injection, menthol-cetylpyridinium, nitroGLYCERIN, ondansetron (ZOFRAN) IV, oxyCODONE, oxyCODONE-acetaminophen, phenol, polyethylene glycol, sodium chloride, zolpidem  Diet:  Dysphagia  Activity:  Bedrest DVT Prophylaxis:  SCDs  CLINICALLY SIGNIFICANT STUDIES Basic Metabolic Panel: No results found for this basename: NA, K, CL, CO2, GLUCOSE, BUN, CREATININE, CALCIUM, MG, PHOS,  in the last 168 hours Liver Function Tests: No results found for this basename: AST, ALT, ALKPHOS, BILITOT, PROT, ALBUMIN,  in the last 168 hours CBC: No results found for this basename: WBC, NEUTROABS, HGB, HCT, MCV, PLT,  in the last 168 hours Coagulation: No results found for this basename: LABPROT, INR,  in the last 168 hours Cardiac Enzymes: No results found for this basename: CKTOTAL, CKMB, CKMBINDEX, TROPONINI,  in the last 168 hours Urinalysis: No results found for this basename: COLORURINE, APPERANCEUR, LABSPEC, PHURINE, GLUCOSEU, HGBUR, BILIRUBINUR, KETONESUR, PROTEINUR, UROBILINOGEN, NITRITE, LEUKOCYTESUR,  in the last 168 hours Lipid Panel    Component Value Date/Time   CHOL 121 12/22/2013 0057   TRIG 142 12/22/2013 0057   HDL 41 12/22/2013 0057   CHOLHDL 3.0 12/22/2013 0057   VLDL 28 12/22/2013 0057   LDLCALC 52 12/22/2013 0057   HgbA1C  Lab Results  Component Value  Date   HGBA1C 5.9* 12/21/2013    Urine Drug Screen:      Component Value Date/Time   LABOPIA NEG 01/31/2009 2108   COCAINSCRNUR NEG 01/31/2009 2108   LABBENZ NEG 01/31/2009 2108   AMPHETMU NEG 01/31/2009 2108    Alcohol Level: No results found for this basename: ETH,  in the last 168 hours  Ct Head Wo Contrast  12/21/2013   CLINICAL DATA:  Left arm and leg weakness  EXAM: CT HEAD WITHOUT CONTRAST  TECHNIQUE: Contiguous axial images were obtained from the base of the skull through the vertex without intravenous contrast.  COMPARISON:  None.  FINDINGS: There is no evidence of mass effect,  midline shift or extra-axial fluid collections. There is no evidence of a space-occupying lesion or intracranial hemorrhage. There is no evidence of a cortical-based area of acute infarction.  The ventricles and sulci are appropriate for the patient's age. The basal cisterns are patent.  Visualized portions of the orbits are unremarkable. There is a right maxillary sinus mucous retention cyst.  The osseous structures are unremarkable.  IMPRESSION: No acute intracranial pathology.  These results were called by telephone at the time of interpretation on 12/21/2013 at 4:27 PM to Dr. Concepcion Living [facility] , who verbally acknowledged these results.   Electronically Signed   By: Kathreen Devoid   On: 12/21/2013 16:28   Mr Jodene Nam Head Wo Contrast  12/22/2013   CLINICAL DATA:  Recent surgery, now with left-sided weakness.  EXAM: MRI HEAD WITHOUT CONTRAST  MRA HEAD WITHOUT CONTRAST  TECHNIQUE: Multiplanar, multiecho pulse sequences of the brain and surrounding structures were obtained without intravenous contrast. Angiographic images of the head were obtained using MRA technique without contrast.  COMPARISON:  CT of the head December 21, 2013 at 1619 hr  FINDINGS: MRI HEAD FINDINGS  Two foci of right frontal lobe white matter reduced diffusion with low ADC values measure up to 8 mm, in addition to sub cm focus of reduced diffusion in the right posterior frontal lobe cortex, axial 27/62 with low ADC values. Additional punctate foci of reduced diffusion within the right parietal subcortical and deep white matter as well as right occipital lobe. Lesions show bright T2 FLAIR signal. No susceptibility artifact to suggest hemorrhage.  The ventricles and sulci are normal for patient's age. A few scattered subcentimeter supratentorial white matter FLAIR T2 hyperintensities, exclusive of the aforementioned lesions noted, within normal range for patient's age. No midline shift or mass effect.  No abnormal extra-axial fluid collections.  Ocular globes and orbital contents are unremarkable though not tailored for evaluation.  Mild paranasal sinus mucosal thickening without air-fluid levels. The patient appears edentulous. Mild degenerative change of the temporomandibular joints. Trace fluid signal within the left mastoid tip. No abnormal sellar expansion. No cerebellar tonsillar ectopia. No suspicious calvarial bone marrow signal.  MRA HEAD FINDINGS  Anterior circulation: Mildly decreased intensity of the flow related enhancement of the right included cervical, petrous, cavernous and supra clinoid internal carotid arteries. The left internal carotid artery flow related enhancement is normal. Patent anterior communicating artery. Normal flow related enhancement of the anterior and middle cerebral arteries, including more distal segments. Bilateral posterior communicating arteries present.  Posterior circulation: Left vertebral artery is dominant. Basilar artery is patent, with normal flow related enhancement of the main branch vessels. Normal flow related enhancement of the posterior cerebral arteries.  No large vessel occlusion, hemodynamically significant stenosis, abnormal luminal irregularity, aneurysm within the anterior nor posterior circulation.  IMPRESSION: MRI  head: Multiple subcentimeter foci of acute to early subacute ischemia within the right frontal, parietal and occipital lobes, spanning multiple vascular territories. This could reflect embolic phenomenon.  MRA head: Less robust flow related enhancement of the right internal carotid artery which may reflect slow flow or even artifact with normal MRA of the intracranial vessels.  Critical Value/emergent results were called by telephone at the time of interpretation on 12/22/2013 at 1:50 AM to Dr. Kary Kos , who verbally acknowledged these results.   Electronically Signed   By: Elon Alas   On: 12/22/2013 01:55   Mr Brain Wo Contrast  12/22/2013   CLINICAL DATA:  Recent surgery,  now with left-sided weakness.  EXAM: MRI HEAD WITHOUT CONTRAST  MRA HEAD WITHOUT CONTRAST  TECHNIQUE: Multiplanar, multiecho pulse sequences of the brain and surrounding structures were obtained without intravenous contrast. Angiographic images of the head were obtained using MRA technique without contrast.  COMPARISON:  CT of the head December 21, 2013 at 1619 hr  FINDINGS: MRI HEAD FINDINGS  Two foci of right frontal lobe white matter reduced diffusion with low ADC values measure up to 8 mm, in addition to sub cm focus of reduced diffusion in the right posterior frontal lobe cortex, axial 27/62 with low ADC values. Additional punctate foci of reduced diffusion within the right parietal subcortical and deep white matter as well as right occipital lobe. Lesions show bright T2 FLAIR signal. No susceptibility artifact to suggest hemorrhage.  The ventricles and sulci are normal for patient's age. A few scattered subcentimeter supratentorial white matter FLAIR T2 hyperintensities, exclusive of the aforementioned lesions noted, within normal range for patient's age. No midline shift or mass effect.  No abnormal extra-axial fluid collections. Ocular globes and orbital contents are unremarkable though not tailored for evaluation.  Mild paranasal sinus mucosal thickening without air-fluid levels. The patient appears edentulous. Mild degenerative change of the temporomandibular joints. Trace fluid signal within the left mastoid tip. No abnormal sellar expansion. No cerebellar tonsillar ectopia. No suspicious calvarial bone marrow signal.  MRA HEAD FINDINGS  Anterior circulation: Mildly decreased intensity of the flow related enhancement of the right included cervical, petrous, cavernous and supra clinoid internal carotid arteries. The left internal carotid artery flow related enhancement is normal. Patent anterior communicating artery. Normal flow related enhancement of the anterior and middle cerebral arteries, including  more distal segments. Bilateral posterior communicating arteries present.  Posterior circulation: Left vertebral artery is dominant. Basilar artery is patent, with normal flow related enhancement of the main branch vessels. Normal flow related enhancement of the posterior cerebral arteries.  No large vessel occlusion, hemodynamically significant stenosis, abnormal luminal irregularity, aneurysm within the anterior nor posterior circulation.  IMPRESSION: MRI head: Multiple subcentimeter foci of acute to early subacute ischemia within the right frontal, parietal and occipital lobes, spanning multiple vascular territories. This could reflect embolic phenomenon.  MRA head: Less robust flow related enhancement of the right internal carotid artery which may reflect slow flow or even artifact with normal MRA of the intracranial vessels.  Critical Value/emergent results were called by telephone at the time of interpretation on 12/22/2013 at 1:50 AM to Dr. Kary Kos , who verbally acknowledged these results.   Electronically Signed   By: Elon Alas   On: 12/22/2013 01:55    CT of the brain    MRI of the brain  Multiple subcentimeter foci of acute to early subacute  ischemia within the right frontal, parietal and occipital lobes,  spanning multiple  vascular territories. This could reflect embolic  phenomenon.   MRA of the brain  Less robust flow related enhancement of the right internal  carotid artery which may reflect slow flow or even artifact with  normal MRA of the intracranial vessels.   2D Echocardiogram    Carotid Doppler    CXR    EKG  normal EKG, normal sinus rhythm, unchanged from previous tracings. For complete results please see formal report.   Therapy Recommendations pending  Physical Exam  Mental Status:  Alert, awake, oriented x 4, thought content appropriate. Comprehension, naming, and repetition intact. Speech fluent without evidence of aphasia. Able to follow 3 step  commands without difficulty.  Cranial Nerves:  II: Discs flat bilaterally; Visual fields grossly normal, pupils equal, round, reactive to light and accommodation  III,IV, VI: ptosis not present, extra-ocular motions intact bilaterally  V,VII: smile mildly asymmetric due to left nasolabial flattening, facial light touch sensation normal bilaterally  VIII: hearing normal bilaterally  IX,X: gag reflex present  XI: bilateral shoulder shrug  XII: midline tongue extension without atrophy or fasciculations  Motor:  Mild left hemiparesis improved.  Sensory: Pinprick and light touch slightly diminished in the left side.  Deep Tendon Reflexes:  Right: Upper Extremity Left: Upper extremity  biceps (C-5 to C-6) 2/4 biceps (C-5 to C-6) 2/4  tricep (C7) 2/4 triceps (C7) 2/4  Brachioradialis (C6) 2/4 Brachioradialis (C6) 2/4  Lower Extremity Lower Extremity  quadriceps (L-2 to L-4) 2/4 quadriceps (L-2 to L-4) 2/4  Achilles (S1) 2/4 Achilles (S1) 2/4  Plantars:  Right: downgoing Left: downgoing  Cerebellar:  Significant for mild dysmetria on the left arm.  Gait:  Unable to test   ASSESSMENT 58 y.o. male with acute onset left hemiparesis, subtle left face weakness noted around noon time today. NIHSS 6 improved now, unremarkable unenhanced CT brain. Embolic strokes R hemisphere.    R hemisphere embolic strokes.   Hospital day # 4  TREATMENT/PLAN  Con't ASA  On MRA he has poor flow on RICA which could be an over call or clot that with emboli to R hemisphere, ordered carotid doppler  2Decho  If no source found on carotid doppler or 2decho, would suggest TEE for thrombus evaluation  No anticoagulation even if suspect thrombus for another week.    Leotis Pain   12/22/2013 9:14 AM  I have personally obtained a history, examined the patient, evaluated imaging results, and formulated the assessment and plan of care. I agree with the above.

## 2013-12-22 NOTE — Progress Notes (Signed)
VASCULAR LAB PRELIMINARY  PRELIMINARY  PRELIMINARY  PRELIMINARY  Carotid Dopplers completed.    Preliminary report:  There is 1-39% right ICA stenosis, with abnormal flow signals.  There is 40-59% left ICA stenosis, distally.  Right vertebral artery flow is antegrade.  Left vertebral artery flow is elevated.  Drevon Plog, RVT 12/22/2013, 10:13 AM

## 2013-12-22 NOTE — Progress Notes (Signed)
  Echocardiogram 2D Echocardiogram has been performed.  Mauricio Po 12/22/2013, 11:44 AM

## 2013-12-23 NOTE — Progress Notes (Signed)
Physical Therapy Treatment Patient Details Name: Bradley Sawyer MRN: [id number] DOB: July 14, 1956 Today's Date: 12/23/2013 Time: 1410-1434 PT Time Calculation (min): 24 min  PT Assessment / Plan / Recommendation  History of Present Illness Patient is a 58 year old male status post L2 fracture with intractable back pain and radiation into his right groin failing all conservative management. 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 Patient has failed conservative management. He presents now for anterior reconstruction with posterior percutaneous fixation.  On 12/21/13, patient with new Rt CVA - transferred to ICU.   PT Comments   Pt able to increase ambulation distance today. Continues to require cues for back precautions and safety. Pt was able to negotiate obstacles with ease in hallway but continues to have difficulty in smaller areas (ie room). Pt continues to be frustrated and restless; stated multiple times "i still dont know why im here, i could be doing all this at home". Will cont to follow per POC.  Follow Up Recommendations  Home health PT;Supervision/Assistance - 24 hour     Does the patient have the potential to tolerate intense rehabilitation     Barriers to Discharge        Equipment Recommendations  Rolling walker with 5" wheels    Recommendations for Other Services OT consult  Frequency Min 5X/week   Progress towards PT Goals Progress towards PT goals: Progressing toward goals  Plan Current plan remains appropriate    Precautions / Restrictions Precautions Precautions: Back;Fall Precaution Comments: pt able ro recall 2/3 back preacutions; requries cues during session multiple times to adhere to precautions  Required Braces or Orthoses: Spinal Brace Spinal Brace: Lumbar corset;Applied in sitting position Restrictions Weight Bearing Restrictions: No   Pertinent Vitals/Pain 8/10; RN notified pt is  requesting pain meds    Mobility  Bed Mobility Overal bed mobility: Needs Assistance Bed Mobility: Rolling;Sidelying to Sit Rolling: Supervision Sidelying to sit: Supervision General bed mobility comments: pt intially twisting to get OOB; requried max cues to slow down and perform Log rolling technique to adhere to back precautions; pt very impulsive Transfers Overall transfer level: Needs assistance Equipment used: Rolling walker (2 wheeled) Transfers: Sit to/from Stand Sit to Stand: Min guard General transfer comment: min guard to steady with transfers; pt slightly unsteady; cues for hand placement and to keep back as straight as possible with transfers  Ambulation/Gait Ambulation/Gait assistance: Min guard Ambulation Distance (Feet): 150 Feet Assistive device: Rolling walker (2 wheeled) Gait Pattern/deviations: Decreased stride length;Trunk flexed;Drifts right/left Gait velocity: decreased Gait velocity interpretation: Below normal speed for age/gender General Gait Details: pt drifts at times; primarily to Lt and with head turns; pt able to avoid objects in hallway but continues to have difficulty managing RW throughout room and in smaller quarters; cues for sequencing and upright posture; min guard to steady with ambulation  Modified Rankin (Stroke Patients Only) Pre-Morbid Rankin Score: Moderate disability Modified Rankin: Moderately severe disability         PT Diagnosis:    PT Problem List:   PT Treatment Interventions:     PT Goals (current goals can now be found in the care plan section) Acute Rehab PT Goals Patient Stated Goal: home to do this stuff PT Goal Formulation: With patient Time For Goal Achievement: 12/26/13 Potential to Achieve Goals: Good  Visit Information  Last PT Received On: 12/23/13 Assistance Needed: +1 History of Present Illness: Patient is a 58 year old male status post L2 fracture with intractable back  pain and radiation into his right groin  failing all conservative management. 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 Patient has failed conservative management. He presents now for anterior reconstruction with posterior percutaneous fixation.  On 12/21/13, patient with new Rt CVA - transferred to ICU.    Subjective Data  Subjective: "i just dont understand while im still here. i could be doing all this at home"  Patient Stated Goal: home to do this stuff   Cognition  Cognition Arousal/Alertness: Awake/alert Behavior During Therapy: Restless;Impulsive Overall Cognitive Status: Impaired/Different from baseline Area of Impairment: Safety/judgement;Awareness;Problem solving Memory: Decreased short-term memory Safety/Judgement: Decreased awareness of deficits;Decreased awareness of safety Problem Solving: Slow processing;Difficulty sequencing;Requires verbal cues General Comments: pt continues to deny any deficits with balance and/or safety awareness; stated again "i had a mini stroke"    Balance  Balance Overall balance assessment: Needs assistance Sitting-balance support: Feet supported;Single extremity supported Sitting balance-Leahy Scale: Good Standing balance support: Bilateral upper extremity supported;During functional activity Standing balance-Leahy Scale: Fair Standing balance comment: bil UEs supported by RW  End of Session PT - End of Session Equipment Utilized During Treatment: Gait belt;Back brace Activity Tolerance: Patient limited by fatigue;Patient limited by pain Patient left: in chair;with call bell/phone within reach Nurse Communication: Mobility status;Patient requests pain meds   GP     Gustavus Bryant, [place] 12/23/2013, 4:18 PM

## 2013-12-23 NOTE — Progress Notes (Signed)
Stroke Team Progress Note  HISTORY   58 y.o. male with a past medical history significant for HTN, hyperlipidemia, smoking, cocaine use ( last use 1998), COPD, arthritis, who underwent uncomplicated L2 corpectomy with L1-L3 fusion 1/27, and was noted to have left sided weakness yesterday. He was last known well at 1200 yesterday, when he was ambulating with his physical therapist and was noted to be leaning to the left side. No reportred HA, vertigo, double vision, difficulty swallowing, slurred speech, language or vision impairment. Initial NIHSS 6. Unenhanced CT brain showed no acute abnormality. Appears to have embolic strokes on  R frontal, parietal and occipital lobes.  No hx of A-fib.    SUBJECTIVE Exam significantly improved, speech close to baseline. LUE drift resolved. Minor facial droop  OBJECTIVE Most recent Vital Signs: Filed Vitals:   12/23/13 0500 12/23/13 0530 12/23/13 0600 12/23/13 0700  BP: 141/74  127/96 128/74  Pulse: 70  60 66  Temp:      TempSrc:      Resp: 14 15 12 11   Height:      Weight:      SpO2: 95%  94% 93%   CBG (last 3)  No results found for this basename: GLUCAP,  in the last 72 hours  IV Fluid Intake:   . sodium chloride      MEDICATIONS  . amLODipine  5 mg Oral Daily  . aspirin EC  325 mg Oral Daily  . atorvastatin  40 mg Oral q1800  . bisacodyl  10 mg Rectal Daily  . esomeprazole  20 mg Oral Q1200  . FLUoxetine  10 mg Oral Daily  . gabapentin  600 mg Oral TID  . lisinopril  20 mg Oral Daily   And  . hydrochlorothiazide  25 mg Oral Daily  . meloxicam  15 mg Oral Daily  . mometasone-formoterol  2 puff Inhalation BID  . nicotine  21 mg Transdermal Daily  . omega-3 acid ethyl esters  2 g Oral BID  . senna  1 tablet Oral BID  . sodium chloride  3 mL Intravenous Q12H  . varenicline  0.5 mg Oral Daily   PRN:  acetaminophen, acetaminophen, albuterol, alum & mag hydroxide-simeth, bisacodyl, diazepam, HYDROcodone-acetaminophen, HYDROmorphone  (DILAUDID) injection, menthol-cetylpyridinium, nitroGLYCERIN, ondansetron (ZOFRAN) IV, oxyCODONE, oxyCODONE-acetaminophen, phenol, polyethylene glycol, sodium chloride, zolpidem  Diet:  Dysphagia  Activity:  Bedrest DVT Prophylaxis:  SCDs  CLINICALLY SIGNIFICANT STUDIES Basic Metabolic Panel: No results found for this basename: NA, K, CL, CO2, GLUCOSE, BUN, CREATININE, CALCIUM, MG, PHOS,  in the last 168 hours Liver Function Tests: No results found for this basename: AST, ALT, ALKPHOS, BILITOT, PROT, ALBUMIN,  in the last 168 hours CBC: No results found for this basename: WBC, NEUTROABS, HGB, HCT, MCV, PLT,  in the last 168 hours Coagulation: No results found for this basename: LABPROT, INR,  in the last 168 hours Cardiac Enzymes: No results found for this basename: CKTOTAL, CKMB, CKMBINDEX, TROPONINI,  in the last 168 hours Urinalysis: No results found for this basename: COLORURINE, APPERANCEUR, LABSPEC, PHURINE, GLUCOSEU, HGBUR, BILIRUBINUR, KETONESUR, PROTEINUR, UROBILINOGEN, NITRITE, LEUKOCYTESUR,  in the last 168 hours Lipid Panel    Component Value Date/Time   CHOL 121 12/22/2013 0057   TRIG 142 12/22/2013 0057   HDL 41 12/22/2013 0057   CHOLHDL 3.0 12/22/2013 0057   VLDL 28 12/22/2013 0057   LDLCALC 52 12/22/2013 0057   HgbA1C  Lab Results  Component Value Date   HGBA1C 5.9* 12/21/2013  Urine Drug Screen:      Component Value Date/Time   LABOPIA NEG 01/31/2009 2108   COCAINSCRNUR NEG 01/31/2009 2108   LABBENZ NEG 01/31/2009 2108   AMPHETMU NEG 01/31/2009 2108    Alcohol Level: No results found for this basename: ETH,  in the last 168 hours  Ct Head Wo Contrast  12/21/2013   CLINICAL DATA:  Left arm and leg weakness  EXAM: CT HEAD WITHOUT CONTRAST  TECHNIQUE: Contiguous axial images were obtained from the base of the skull through the vertex without intravenous contrast.  COMPARISON:  None.  FINDINGS: There is no evidence of mass effect, midline shift or extra-axial fluid  collections. There is no evidence of a space-occupying lesion or intracranial hemorrhage. There is no evidence of a cortical-based area of acute infarction.  The ventricles and sulci are appropriate for the patient's age. The basal cisterns are patent.  Visualized portions of the orbits are unremarkable. There is a right maxillary sinus mucous retention cyst.  The osseous structures are unremarkable.  IMPRESSION: No acute intracranial pathology.  These results were called by telephone at the time of interpretation on 12/21/2013 at 4:27 PM to Dr. Eugene [facility] , who verbally acknowledged these results.   Electronically Signed   By: Elige Ko   On: 12/21/2013 16:28   Mr Maxine Glenn Head Wo Contrast  12/22/2013   CLINICAL DATA:  Recent surgery, now with left-sided weakness.  EXAM: MRI HEAD WITHOUT CONTRAST  MRA HEAD WITHOUT CONTRAST  TECHNIQUE: Multiplanar, multiecho pulse sequences of the brain and surrounding structures were obtained without intravenous contrast. Angiographic images of the head were obtained using MRA technique without contrast.  COMPARISON:  CT of the head December 21, 2013 at 1619 hr  FINDINGS: MRI HEAD FINDINGS  Two foci of right frontal lobe white matter reduced diffusion with low ADC values measure up to 8 mm, in addition to sub cm focus of reduced diffusion in the right posterior frontal lobe cortex, axial 27/62 with low ADC values. Additional punctate foci of reduced diffusion within the right parietal subcortical and deep white matter as well as right occipital lobe. Lesions show bright T2 FLAIR signal. No susceptibility artifact to suggest hemorrhage.  The ventricles and sulci are normal for patient's age. A few scattered subcentimeter supratentorial white matter FLAIR T2 hyperintensities, exclusive of the aforementioned lesions noted, within normal range for patient's age. No midline shift or mass effect.  No abnormal extra-axial fluid collections. Ocular globes and orbital contents are  unremarkable though not tailored for evaluation.  Mild paranasal sinus mucosal thickening without air-fluid levels. The patient appears edentulous. Mild degenerative change of the temporomandibular joints. Trace fluid signal within the left mastoid tip. No abnormal sellar expansion. No cerebellar tonsillar ectopia. No suspicious calvarial bone marrow signal.  MRA HEAD FINDINGS  Anterior circulation: Mildly decreased intensity of the flow related enhancement of the right included cervical, petrous, cavernous and supra clinoid internal carotid arteries. The left internal carotid artery flow related enhancement is normal. Patent anterior communicating artery. Normal flow related enhancement of the anterior and middle cerebral arteries, including more distal segments. Bilateral posterior communicating arteries present.  Posterior circulation: Left vertebral artery is dominant. Basilar artery is patent, with normal flow related enhancement of the main branch vessels. Normal flow related enhancement of the posterior cerebral arteries.  No large vessel occlusion, hemodynamically significant stenosis, abnormal luminal irregularity, aneurysm within the anterior nor posterior circulation.  IMPRESSION: MRI head: Multiple subcentimeter foci of acute to early subacute  ischemia within the right frontal, parietal and occipital lobes, spanning multiple vascular territories. This could reflect embolic phenomenon.  MRA head: Less robust flow related enhancement of the right internal carotid artery which may reflect slow flow or even artifact with normal MRA of the intracranial vessels.  Critical Value/emergent results were called by telephone at the time of interpretation on 12/22/2013 at 1:50 AM to Dr. Kary Kos , who verbally acknowledged these results.   Electronically Signed   By: Elon Alas   On: 12/22/2013 01:55   Mr Brain Wo Contrast  12/22/2013   CLINICAL DATA:  Recent surgery, now with left-sided weakness.  EXAM:  MRI HEAD WITHOUT CONTRAST  MRA HEAD WITHOUT CONTRAST  TECHNIQUE: Multiplanar, multiecho pulse sequences of the brain and surrounding structures were obtained without intravenous contrast. Angiographic images of the head were obtained using MRA technique without contrast.  COMPARISON:  CT of the head December 21, 2013 at 1619 hr  FINDINGS: MRI HEAD FINDINGS  Two foci of right frontal lobe white matter reduced diffusion with low ADC values measure up to 8 mm, in addition to sub cm focus of reduced diffusion in the right posterior frontal lobe cortex, axial 27/62 with low ADC values. Additional punctate foci of reduced diffusion within the right parietal subcortical and deep white matter as well as right occipital lobe. Lesions show bright T2 FLAIR signal. No susceptibility artifact to suggest hemorrhage.  The ventricles and sulci are normal for patient's age. A few scattered subcentimeter supratentorial white matter FLAIR T2 hyperintensities, exclusive of the aforementioned lesions noted, within normal range for patient's age. No midline shift or mass effect.  No abnormal extra-axial fluid collections. Ocular globes and orbital contents are unremarkable though not tailored for evaluation.  Mild paranasal sinus mucosal thickening without air-fluid levels. The patient appears edentulous. Mild degenerative change of the temporomandibular joints. Trace fluid signal within the left mastoid tip. No abnormal sellar expansion. No cerebellar tonsillar ectopia. No suspicious calvarial bone marrow signal.  MRA HEAD FINDINGS  Anterior circulation: Mildly decreased intensity of the flow related enhancement of the right included cervical, petrous, cavernous and supra clinoid internal carotid arteries. The left internal carotid artery flow related enhancement is normal. Patent anterior communicating artery. Normal flow related enhancement of the anterior and middle cerebral arteries, including more distal segments. Bilateral  posterior communicating arteries present.  Posterior circulation: Left vertebral artery is dominant. Basilar artery is patent, with normal flow related enhancement of the main branch vessels. Normal flow related enhancement of the posterior cerebral arteries.  No large vessel occlusion, hemodynamically significant stenosis, abnormal luminal irregularity, aneurysm within the anterior nor posterior circulation.  IMPRESSION: MRI head: Multiple subcentimeter foci of acute to early subacute ischemia within the right frontal, parietal and occipital lobes, spanning multiple vascular territories. This could reflect embolic phenomenon.  MRA head: Less robust flow related enhancement of the right internal carotid artery which may reflect slow flow or even artifact with normal MRA of the intracranial vessels.  Critical Value/emergent results were called by telephone at the time of interpretation on 12/22/2013 at 1:50 AM to Dr. Kary Kos , who verbally acknowledged these results.   Electronically Signed   By: Elon Alas   On: 12/22/2013 01:55    CT of the brain    MRI of the brain  Multiple subcentimeter foci of acute to early subacute  ischemia within the right frontal, parietal and occipital lobes,  spanning multiple vascular territories. This could reflect embolic  phenomenon.  MRA of the brain  Less robust flow related enhancement of the right internal  carotid artery which may reflect slow flow or even artifact with  normal MRA of the intracranial vessels.   2D Echocardiogram  The estimated ejection fraction was in the range of 55% to 60%. No evidence of thrombus   Carotid Doppler    CXR    EKG  normal EKG, normal sinus rhythm, unchanged from previous tracings. For complete results please see formal report.   Therapy Recommendations pending  Physical Exam  Mental Status:  Alert, awake, oriented x 4, thought content appropriate. Comprehension, naming, and repetition intact. Speech fluent  without evidence of aphasia. Able to follow 3 step commands without difficulty.  Cranial Nerves:  II: Discs flat bilaterally; Visual fields grossly normal, pupils equal, round, reactive to light and accommodation  III,IV, VI: ptosis not present, extra-ocular motions intact bilaterally  V,VII: smile mildly asymmetric due to left nasolabial flattening, facial light touch sensation normal bilaterally  VIII: hearing normal bilaterally  IX,X: gag reflex present  XI: bilateral shoulder shrug  XII: midline tongue extension without atrophy or fasciculations  Motor:  LUE drift resolved 5/5 b/l Sensory: Pinprick and light touch slightly diminished in the left side.  Deep Tendon Reflexes:  Right: Upper Extremity Left: Upper extremity  biceps (C-5 to C-6) 2/4 biceps (C-5 to C-6) 2/4  tricep (C7) 2/4 triceps (C7) 2/4  Brachioradialis (C6) 2/4 Brachioradialis (C6) 2/4  Lower Extremity Lower Extremity  quadriceps (L-2 to L-4) 2/4 quadriceps (L-2 to L-4) 2/4  Achilles (S1) 2/4 Achilles (S1) 2/4  Plantars:  Right: downgoing Left: downgoing  Cerebellar:  Significant for mild dysmetria on the left arm.  Gait:  Unable to test   ASSESSMENT 58 y.o. male with acute onset left hemiparesis, subtle left face weakness noted around noon time today. NIHSS 6 improved now, unremarkable unenhanced CT brain. Embolic strokes R hemisphere.    R hemisphere embolic strokes.   Hospital day # 5  TREATMENT/PLAN  Con't ASA  On MRA he has poor flow on RICA which could be an over call or clot that with emboli to R hemisphere, ordered carotid doppler, will be done today  2D echo normal EF, no wall motion abnormality   Would obtain TEE early next week.   No anticoagulation even if suspect thrombus for another week due to stroke.   Leotis Pain   12/23/2013 8:22 AM  I have personally obtained a history, examined the patient, evaluated imaging results, and formulated the assessment and plan of care. I agree  with the above.

## 2013-12-23 NOTE — Progress Notes (Signed)
Subjective: Patient reports Overall doing well no significant pain this morning speech and left hemiparesis and hemiapraxia improving  Objective: Vital signs in last 24 hours: Temp:  [97.3 F (36.3 C)-98.6 F (37 C)] 98.1 F (36.7 C) (02/01 0400) Pulse Rate:  [58-77] 66 (02/01 0700) Resp:  [8-24] 11 (02/01 0700) BP: (106-170)/(46-106) 128/74 mmHg (02/01 0700) SpO2:  [90 %-97 %] 93 % (02/01 0700)  Intake/Output from previous day: 01/31 0701 - 02/01 0700 In: 1583 [P.O.:1580; I.V.:3] Out: 275 [Urine:275] Intake/Output this shift:    Awake alert oriented strength appears to be 5 of 5 with a very slight left pronator drift.  Lab Results: No results found for this basename: WBC, HGB, HCT, PLT,  in the last 72 hours BMET No results found for this basename: NA, K, CL, CO2, GLUCOSE, BUN, CREATININE, CALCIUM,  in the last 72 hours  Studies/Results: Ct Head Wo Contrast  12/21/2013   CLINICAL DATA:  Left arm and leg weakness  EXAM: CT HEAD WITHOUT CONTRAST  TECHNIQUE: Contiguous axial images were obtained from the base of the skull through the vertex without intravenous contrast.  COMPARISON:  None.  FINDINGS: There is no evidence of mass effect, midline shift or extra-axial fluid collections. There is no evidence of a space-occupying lesion or intracranial hemorrhage. There is no evidence of a cortical-based area of acute infarction.  The ventricles and sulci are appropriate for the patient's age. The basal cisterns are patent.  Visualized portions of the orbits are unremarkable. There is a right maxillary sinus mucous retention cyst.  The osseous structures are unremarkable.  IMPRESSION: No acute intracranial pathology.  These results were called by telephone at the time of interpretation on 12/21/2013 at 4:27 PM to Dr. Concepcion Living [facility] , who verbally acknowledged these results.   Electronically Signed   By: Kathreen Devoid   On: 12/21/2013 16:28   Mr Jodene Nam Head Wo Contrast  12/22/2013    CLINICAL DATA:  Recent surgery, now with left-sided weakness.  EXAM: MRI HEAD WITHOUT CONTRAST  MRA HEAD WITHOUT CONTRAST  TECHNIQUE: Multiplanar, multiecho pulse sequences of the brain and surrounding structures were obtained without intravenous contrast. Angiographic images of the head were obtained using MRA technique without contrast.  COMPARISON:  CT of the head December 21, 2013 at 1619 hr  FINDINGS: MRI HEAD FINDINGS  Two foci of right frontal lobe white matter reduced diffusion with low ADC values measure up to 8 mm, in addition to sub cm focus of reduced diffusion in the right posterior frontal lobe cortex, axial 27/62 with low ADC values. Additional punctate foci of reduced diffusion within the right parietal subcortical and deep white matter as well as right occipital lobe. Lesions show bright T2 FLAIR signal. No susceptibility artifact to suggest hemorrhage.  The ventricles and sulci are normal for patient's age. A few scattered subcentimeter supratentorial white matter FLAIR T2 hyperintensities, exclusive of the aforementioned lesions noted, within normal range for patient's age. No midline shift or mass effect.  No abnormal extra-axial fluid collections. Ocular globes and orbital contents are unremarkable though not tailored for evaluation.  Mild paranasal sinus mucosal thickening without air-fluid levels. The patient appears edentulous. Mild degenerative change of the temporomandibular joints. Trace fluid signal within the left mastoid tip. No abnormal sellar expansion. No cerebellar tonsillar ectopia. No suspicious calvarial bone marrow signal.  MRA HEAD FINDINGS  Anterior circulation: Mildly decreased intensity of the flow related enhancement of the right included cervical, petrous, cavernous and supra clinoid internal carotid  arteries. The left internal carotid artery flow related enhancement is normal. Patent anterior communicating artery. Normal flow related enhancement of the anterior and middle  cerebral arteries, including more distal segments. Bilateral posterior communicating arteries present.  Posterior circulation: Left vertebral artery is dominant. Basilar artery is patent, with normal flow related enhancement of the main branch vessels. Normal flow related enhancement of the posterior cerebral arteries.  No large vessel occlusion, hemodynamically significant stenosis, abnormal luminal irregularity, aneurysm within the anterior nor posterior circulation.  IMPRESSION: MRI head: Multiple subcentimeter foci of acute to early subacute ischemia within the right frontal, parietal and occipital lobes, spanning multiple vascular territories. This could reflect embolic phenomenon.  MRA head: Less robust flow related enhancement of the right internal carotid artery which may reflect slow flow or even artifact with normal MRA of the intracranial vessels.  Critical Value/emergent results were called by telephone at the time of interpretation on 12/22/2013 at 1:50 AM to Dr. Kary Kos , who verbally acknowledged these results.   Electronically Signed   By: Elon Alas   On: 12/22/2013 01:55   Mr Brain Wo Contrast  12/22/2013   CLINICAL DATA:  Recent surgery, now with left-sided weakness.  EXAM: MRI HEAD WITHOUT CONTRAST  MRA HEAD WITHOUT CONTRAST  TECHNIQUE: Multiplanar, multiecho pulse sequences of the brain and surrounding structures were obtained without intravenous contrast. Angiographic images of the head were obtained using MRA technique without contrast.  COMPARISON:  CT of the head December 21, 2013 at 1619 hr  FINDINGS: MRI HEAD FINDINGS  Two foci of right frontal lobe white matter reduced diffusion with low ADC values measure up to 8 mm, in addition to sub cm focus of reduced diffusion in the right posterior frontal lobe cortex, axial 27/62 with low ADC values. Additional punctate foci of reduced diffusion within the right parietal subcortical and deep white matter as well as right occipital lobe.  Lesions show bright T2 FLAIR signal. No susceptibility artifact to suggest hemorrhage.  The ventricles and sulci are normal for patient's age. A few scattered subcentimeter supratentorial white matter FLAIR T2 hyperintensities, exclusive of the aforementioned lesions noted, within normal range for patient's age. No midline shift or mass effect.  No abnormal extra-axial fluid collections. Ocular globes and orbital contents are unremarkable though not tailored for evaluation.  Mild paranasal sinus mucosal thickening without air-fluid levels. The patient appears edentulous. Mild degenerative change of the temporomandibular joints. Trace fluid signal within the left mastoid tip. No abnormal sellar expansion. No cerebellar tonsillar ectopia. No suspicious calvarial bone marrow signal.  MRA HEAD FINDINGS  Anterior circulation: Mildly decreased intensity of the flow related enhancement of the right included cervical, petrous, cavernous and supra clinoid internal carotid arteries. The left internal carotid artery flow related enhancement is normal. Patent anterior communicating artery. Normal flow related enhancement of the anterior and middle cerebral arteries, including more distal segments. Bilateral posterior communicating arteries present.  Posterior circulation: Left vertebral artery is dominant. Basilar artery is patent, with normal flow related enhancement of the main branch vessels. Normal flow related enhancement of the posterior cerebral arteries.  No large vessel occlusion, hemodynamically significant stenosis, abnormal luminal irregularity, aneurysm within the anterior nor posterior circulation.  IMPRESSION: MRI head: Multiple subcentimeter foci of acute to early subacute ischemia within the right frontal, parietal and occipital lobes, spanning multiple vascular territories. This could reflect embolic phenomenon.  MRA head: Less robust flow related enhancement of the right internal carotid artery which may  reflect slow flow or  even artifact with normal MRA of the intracranial vessels.  Critical Value/emergent results were called by telephone at the time of interpretation on 12/22/2013 at 1:50 AM to Dr. Kary Kos , who verbally acknowledged these results.   Electronically Signed   By: Elon Alas   On: 12/22/2013 01:55    Assessment/Plan: Improving from his CVA on Friday will transfer him to the floor  LOS: 5 days     Forest Redwine P 12/23/2013, 8:24 AM

## 2013-12-23 NOTE — Progress Notes (Signed)
Report called to 4N RN, Sonia Baller.  Said she would call me back in 10 minutes. [place], [place]

## 2013-12-23 NOTE — Progress Notes (Signed)
Patient to be transferred to [id number].  Report called to receiving nurse.  Asked to call back in 10 minutes. [place], [place]

## 2013-12-24 MED ORDER — STROKE: EARLY STAGES OF RECOVERY BOOK
Freq: Once | Status: AC
  Administered 2013-12-24: 19:00:00
  Filled 2013-12-24: qty 1

## 2013-12-24 MED ORDER — SODIUM CHLORIDE 0.9 % IV SOLN
INTRAVENOUS | Status: DC
  Administered 2013-12-24: 23:00:00 via INTRAVENOUS
  Administered 2013-12-25: 500 mL via INTRAVENOUS

## 2013-12-24 NOTE — Progress Notes (Signed)
Stroke Team Progress Note  HISTORY 58 y.o. male with a past medical history significant for HTN, hyperlipidemia, smoking, cocaine use ( last use 1998), COPD, arthritis, who underwent uncomplicated L2 corpectomy with L1-L3 fusion 12/18/2013, and was noted to have left sided weakness 12/20/2013. He was last known well at 1200 12/20/2013, when he was ambulating with his physical therapist and was noted to be leaning to the left side. No reportred HA, vertigo, double vision, difficulty swallowing, slurred speech, language or vision impairment. Initial NIHSS 6. Unenhanced CT brain showed no acute abnormality. Appears to have embolic strokes on  R frontal, parietal and occipital lobes.  No hx of A-fib. He was not a tPA candidate secondary to recent surgery.   SUBJECTIVE Patient lying in the bed. Helpful with history discussion with Dr. Leonie Sawyer.  OBJECTIVE Most recent Vital Signs: Filed Vitals:   12/23/13 2122 12/24/13 0145 12/24/13 0604 12/24/13 0904  BP: 122/74 111/71 129/54 156/71  Pulse: 66 66 67 77  Temp: 97.9 F (36.6 C) 98.1 F (36.7 C) 97.9 F (36.6 C) 98.1 F (36.7 C)  TempSrc: Oral Oral Oral Oral  Resp: 20 20 22 22   Height:      Weight:      SpO2: 91% 91% 93% 90%   CBG (last 3)  No results found for this basename: GLUCAP,  in the last 72 hours  IV Fluid Intake:   . sodium chloride      MEDICATIONS  . amLODipine  5 mg Oral Daily  . aspirin EC  325 mg Oral Daily  . atorvastatin  40 mg Oral q1800  . bisacodyl  10 mg Rectal Daily  . esomeprazole  20 mg Oral Q1200  . FLUoxetine  10 mg Oral Daily  . gabapentin  600 mg Oral TID  . lisinopril  20 mg Oral Daily   And  . hydrochlorothiazide  25 mg Oral Daily  . meloxicam  15 mg Oral Daily  . mometasone-formoterol  2 puff Inhalation BID  . nicotine  21 mg Transdermal Daily  . omega-3 acid ethyl esters  2 g Oral BID  . senna  1 tablet Oral BID  . sodium chloride  3 mL Intravenous Q12H  . varenicline  0.5 mg Oral Daily   PRN:   acetaminophen, acetaminophen, albuterol, alum & mag hydroxide-simeth, bisacodyl, diazepam, HYDROcodone-acetaminophen, HYDROmorphone (DILAUDID) injection, menthol-cetylpyridinium, nitroGLYCERIN, ondansetron (ZOFRAN) IV, oxyCODONE, oxyCODONE-acetaminophen, phenol, polyethylene glycol, sodium chloride, zolpidem  Diet:  Dysphagia  Activity:  Bedrest DVT Prophylaxis:  SCDs  CLINICALLY SIGNIFICANT STUDIES Basic Metabolic Panel: No results found for this basename: NA, K, CL, CO2, GLUCOSE, BUN, CREATININE, CALCIUM, MG, PHOS,  in the last 168 hours Liver Function Tests: No results found for this basename: AST, ALT, ALKPHOS, BILITOT, PROT, ALBUMIN,  in the last 168 hours CBC: No results found for this basename: WBC, NEUTROABS, HGB, HCT, MCV, PLT,  in the last 168 hours Coagulation: No results found for this basename: LABPROT, INR,  in the last 168 hours Cardiac Enzymes: No results found for this basename: CKTOTAL, CKMB, CKMBINDEX, TROPONINI,  in the last 168 hours Urinalysis: No results found for this basename: COLORURINE, APPERANCEUR, LABSPEC, PHURINE, GLUCOSEU, HGBUR, BILIRUBINUR, KETONESUR, PROTEINUR, UROBILINOGEN, NITRITE, LEUKOCYTESUR,  in the last 168 hours Lipid Panel    Component Value Date/Time   CHOL 121 12/22/2013 0057   TRIG 142 12/22/2013 0057   HDL 41 12/22/2013 0057   CHOLHDL 3.0 12/22/2013 0057   VLDL 28 12/22/2013 0057   LDLCALC 52 12/22/2013 0057  HgbA1C  Lab Results  Component Value Date   HGBA1C 5.9* 12/21/2013    Urine Drug Screen:      Component Value Date/Time   LABOPIA NEG 01/31/2009 2108   COCAINSCRNUR NEG 01/31/2009 2108   LABBENZ NEG 01/31/2009 2108   AMPHETMU NEG 01/31/2009 2108    Alcohol Level: No results found for this basename: ETH,  in the last 168 hours  No results found.  CT of the brain    MRI of the brain  Multiple subcentimeter foci of acute to early subacute ischemia within the right frontal, parietal and occipital lobes, spanning multiple vascular  territories. This could reflect embolic phenomenon.  MRA of the brain  Less robust flow related enhancement of the right internal carotid artery which may reflect slow flow or even artifact with normal MRA of the intracranial vessels.  2D Echocardiogram  The estimated ejection fraction was in the range of 55% to 60%. No evidence of thrombus   Carotid Doppler  There is 1-39% right ICA stenosis, with abnormal flow signals. There is 40-59% left ICA stenosis, distally. Right vertebral artery flow is antegrade. Left vertebral artery flow is elevated.  CXR    EKG  normal EKG, normal sinus rhythm, unchanged from previous tracings. For complete results please see formal report.   Therapy Recommendations HH PT, HH OT, RW  Physical Exam  Mental Status:  Alert, awake, oriented x 4, thought content appropriate. Comprehension, naming, and repetition intact. Speech fluent without evidence of aphasia. Able to follow 3 step commands without difficulty.  Cranial Nerves:  II: Discs flat bilaterally; Visual fields grossly normal, pupils equal, round, reactive to light and accommodation  III,IV, VI: ptosis not present, extra-ocular motions intact bilaterally  V,VII: smile mildly asymmetric due to left nasolabial flattening, facial light touch sensation normal bilaterally  VIII: hearing normal bilaterally  IX,X: gag reflex present  XI: bilateral shoulder shrug  XII: midline tongue extension without atrophy or fasciculations  Motor:  LUE drift resolved 5/5 b/l Sensory: Pinprick and light touch slightly diminished in the left side.  Deep Tendon Reflexes:  Right: Upper Extremity Left: Upper extremity  biceps (C-5 to C-6) 2/4 biceps (C-5 to C-6) 2/4  tricep (C7) 2/4 triceps (C7) 2/4  Brachioradialis (C6) 2/4 Brachioradialis (C6) 2/4  Lower Extremity Lower Extremity  quadriceps (L-2 to L-4) 2/4 quadriceps (L-2 to L-4) 2/4  Achilles (S1) 2/4 Achilles (S1) 2/4  Plantars:  Right: downgoing Left: downgoing   Cerebellar:  Significant for mild dysmetria on the left arm.  Gait:  Unable to test   ASSESSMENT [name] is a 58 y.o. male presenting with acute onset left hemiparesis, subtle left face weakness 2 days post lumbar surgery for compression fractures. MRI confirms embolic strokes R hemisphere in multiple vascular territories, etiology unclear.  On aspirin 81 mg orally every day prior to admission, though he had stopped taking in preparation for surgery. He is now on aspirin 325 mg orally every day for secondary stroke prevention. Patient with resultant left motor apraxia, left facial weakness. Work up underway.  hypertension Hyperlipidemia, LDL 52, on lovaza and crestor PTA, now on lipitor and lovaza, at goal LDL < 100 COPD Tobacco abuse Hx cocaine abuse, last use [facility] day # 6  TREATMENT/PLAN  Continue aspirin for secondary stroke prevention. No indication to add plavix at this time.  TEE to look for embolic source. Arranged with Biehle for tomorrow.  If positive for PFO (patent foramen  ovale), check bilateral lower extremity venous dopplers to rule out DVT as possible source of stroke. (I have made patient NPO after midnight tonight).  Burnetta Sabin, MSN, RN, ANVP-BC, ANP-BC, [facility] Pager: [phone] 12/24/2013 5:44 PM  I have personally obtained a history, examined the patient, evaluated imaging results, and formulated the assessment and plan of care. I agree with the above. Antony Contras, MD

## 2013-12-24 NOTE — Progress Notes (Signed)
Overall stable. No new evidence or problem. Patient still notes incisional discomfort and some pain with hip flexion on the left which is to be expected after the surgery he underwent. Still no right-sided symptoms. Patient denies headache. Having no new symptoms of numbness or weakness. Still having some apraxia on the left.  Afebrile. Vitals are stable. Patient is awake and alert. He is oriented and appropriate. Cranial nerve function is intact aside from some very slight left-sided facial weakness. Motor 5/5 bilaterally. No pronator drift. Still some motor apraxia on the left. Wounds clean and dry. Chest and abdomen benign.  Overall stable. MRI scan consistent with small embolic strokes. Patient now on aspirin. Question as to whether he should be on Plavix as well. I will discuss with neurology. Continue efforts at mobilization. Possible discharge Wednesday.

## 2013-12-24 NOTE — Progress Notes (Signed)
Physical Therapy Treatment Patient Details Name: Bradley Sawyer MRN: [id number] DOB: Dec 03, 1955 Today's Date: 12/24/2013 Time: 5366-4403 PT Time Calculation (min): 24 min  PT Assessment / Plan / Recommendation  History of Present Illness Patient is a 58 year old male status post L2 fracture with intractable back pain and radiation into his right groin failing all conservative management. 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 Patient has failed conservative management. He presents now for anterior reconstruction with posterior percutaneous fixation.  On 12/21/13, patient with new Rt CVA - transferred to ICU.   PT Comments   Patient continues to demonstrate good overall functional mobility. Ambulating at supervision levels, no physical assist required. Patient able to verbalize precautions but does still require minimal cues for safety.  Feel patient will be safe for dc home to brothers with supervision and PRN assist in addition to HHPT.   Follow Up Recommendations  Home health PT;Supervision/Assistance - 24 hour     Does the patient have the potential to tolerate intense rehabilitation     Barriers to Discharge        Equipment Recommendations  Rolling walker with 5" wheels    Recommendations for Other Services    Frequency Min 5X/week   Progress towards PT Goals Progress towards PT goals: Progressing toward goals  Plan Current plan remains appropriate    Precautions / Restrictions Precautions Precautions: Back;Fall Precaution Comments: pt able ro recall 2/3 back preacutions; requries cues during session multiple times to adhere to precautions  Required Braces or Orthoses: Spinal Brace Spinal Brace: Lumbar corset;Applied in sitting position Restrictions Weight Bearing Restrictions: No   Pertinent Vitals/Pain 6/10    Mobility  Bed Mobility Overal bed mobility: Needs Assistance Bed Mobility:  Rolling;Sidelying to Sit;Sit to Sidelying Rolling: Supervision Sidelying to sit: Supervision Sit to sidelying: Supervision General bed mobility comments: Good compliance and technique today Transfers Overall transfer level: Needs assistance Equipment used: Rolling walker (2 wheeled) Transfers: Sit to/from Stand Sit to Stand: Supervision General transfer comment: supervision with VCs for hand placement  Ambulation/Gait Ambulation/Gait assistance: Supervision Ambulation Distance (Feet): 620 Feet Assistive device: Rolling walker (2 wheeled) Gait Pattern/deviations: Decreased stride length;Trunk flexed;Drifts right/left Gait velocity: improving Gait velocity interpretation: Below normal speed for age/gender General Gait Details: VCs for upright posture      PT Goals (current goals can now be found in the care plan section) Acute Rehab PT Goals Patient Stated Goal: would like to go home (to brothers) PT Goal Formulation: With patient Time For Goal Achievement: 12/26/13 Potential to Achieve Goals: Good  Visit Information  Last PT Received On: 12/24/13 Assistance Needed: +1 History of Present Illness: Patient is a 58 year old male status post L2 fracture with intractable back pain and radiation into his right groin failing all conservative management. 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 Patient has failed conservative management. He presents now for anterior reconstruction with posterior percutaneous fixation.  On 12/21/13, patient with new Rt CVA - transferred to ICU.    Subjective Data  Subjective: "I would like to know what you think as far as me going to my brothers" Patient Stated Goal: would like to go home (to brothers)   Cognition  Cognition Arousal/Alertness: Awake/alert Behavior During Therapy: WFL for tasks assessed/performed Overall Cognitive Status: Within Functional Limits for tasks assessed     Balance  General Comments General comments (skin integrity, edema, etc.): Minimal cues for precautions, able to verbalize with good  recall 3/3   End of Session PT - End of Session Equipment Utilized During Treatment: Gait belt;Back brace Activity Tolerance: Patient limited by fatigue;Patient limited by pain Patient left: in bed;with call bell/phone within reach;with bed alarm set Nurse Communication: Mobility status;Patient requests pain meds   GP     [name] 12/24/2013, 9:29 AM [name], PT DPT  [phone]

## 2013-12-24 NOTE — Evaluation (Signed)
Occupational Therapy Re-Evaluation Patient Details Name: TREMELL REIMERS MRN: [id number] DOB: 01-05-1956 Today's Date: 12/24/2013 Time: 6629-4765 OT Time Calculation (min): 25 min  OT Assessment / Plan / Recommendation History of present illness Patient is a 58 year old male status post L2 fracture with intractable back pain and radiation into his right groin failing all conservative management. 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 Patient has failed conservative management. He presents now for anterior reconstruction with posterior percutaneous fixation.  On 12/21/13, patient with new Rt CVA - transferred to ICU.   Clinical Impression   Pt presents with below problem list. Feel pt will benefit from acute OT to increase independence prior to d/c. Recommending HHOT and 24/7 supervision/assistance upon d/c.     OT Assessment  Patient needs continued OT Services    Follow Up Recommendations  Home health OT;Supervision/Assistance - 24 hour    Barriers to Discharge Decreased caregiver support    Equipment Recommendations  Other (comment) (may need toilet aid)    Recommendations for Other Services    Frequency  Min 2X/week    Precautions / Restrictions Precautions Precautions: Back;Fall Precaution Booklet Issued: No Precaution Comments: Reviewed precautions with pt Required Braces or Orthoses: Spinal Brace Spinal Brace: Lumbar corset;Applied in sitting position Restrictions Weight Bearing Restrictions: No   Pertinent Vitals/Pain Pain 8/10. Nurse gave meds before session. Increased activity during session.     ADL  Grooming: Teeth care;Min guard Where Assessed - Grooming: Supported standing Upper Body Dressing: Minimal assistance (back brace) Where Assessed - Upper Body Dressing: Unsupported sitting Lower Body Dressing: Minimal assistance (socks; assisted with putting sock on sockaid) Where Assessed - Lower  Body Dressing: Supported sitting Toilet Transfer: Min Radio broadcast assistant Method: Sit to Loss adjuster, chartered:  (bed and chair) Tub/Shower Transfer Method: Not assessed Equipment Used: Gait belt;Back brace;Rolling walker;Reacher;Sock aid;Long-handled sponge Transfers/Ambulation Related to ADLs: Min guard/Supervision for ambulation and transfers. ADL Comments: Practiced with reacher and sockaid during session. Pt performed teeth care and used cups to avoid breaking precautions. Told pt to be using left hand during activities as he appears to demonstrate decreased coordination.  Discussed use of bag on walker.    OT Diagnosis: Acute pain;Disturbance of vision  OT Problem List: Decreased strength;Decreased activity tolerance;Impaired balance (sitting and/or standing);Decreased range of motion;Decreased knowledge of use of DME or AE;Decreased cognition;Decreased knowledge of precautions;Decreased coordination;Pain OT Treatment Interventions: Self-care/ADL training;DME and/or AE instruction;Therapeutic activities;Patient/family education;Balance training   OT Goals(Current goals can be found in the care plan section) Acute Rehab OT Goals Patient Stated Goal: not stated OT Goal Formulation: With patient Time For Goal Achievement: 12/31/13 Potential to Achieve Goals: Good  Visit Information  Last OT Received On: 12/24/13 Assistance Needed: +1 History of Present Illness: Patient is a 58 year old male status post L2 fracture with intractable back pain and radiation into his right groin failing all conservative management. 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 Patient has failed conservative management. He presents now for anterior reconstruction with posterior percutaneous fixation.  On 12/21/13, patient with new Rt CVA - transferred to ICU.       Prior [place] expects to be discharged to:: Private residence Living Arrangements: Other (Comment) (discharging with brother) Available Help at Discharge: Family;Available PRN/intermittently Type of Home: House Home Access: Level entry Home Layout: One level Home Equipment: Bedside commode (pt now has AE kit) Additional Comments: brother  has 3 in 1 commode  Prior Function Level of Independence: Independent Comments: pt drives and was fully independent prior to surgery Communication Communication: No difficulties Dominant Hand: Right         Vision/Perception Vision - Assessment Vision Assessment: Vision tested Tracking/Visual Pursuits: Other (comment) (difficulty tracking in superior quadrants) Visual Fields: Other (comment) (difficulty in left superior quadrant)   Cognition  Cognition Arousal/Alertness: Awake/alert Behavior During Therapy: WFL for tasks assessed/performed Overall Cognitive Status: Impaired/Different from baseline Area of Impairment: Memory Memory: Decreased short-term memory    Extremity/Trunk Assessment Upper Extremity Assessment Upper Extremity Assessment: LUE deficits/detail LUE Coordination: decreased fine motor (mild-difficulty opening package during session) Lower Extremity Assessment Lower Extremity Assessment: Defer to PT evaluation     Mobility Bed Mobility Overal bed mobility: Needs Assistance Bed Mobility: Rolling;Sidelying to Sit;Sit to Sidelying Rolling: Supervision Sidelying to sit: Supervision Sit to sidelying: Supervision General bed mobility comments: cues to reinforce technique. Transfers Overall transfer level: Needs assistance Equipment used: Rolling walker (2 wheeled) Transfers: Sit to/from Stand Sit to Stand: Min guard;Supervision General transfer comment: cues for hand placement     Exercise     Balance     End of Session OT - End of Session Equipment Utilized During Treatment: Gait belt;Rolling walker;Back  brace Activity Tolerance: Patient tolerated treatment well Patient left: in bed;with call bell/phone within reach;with bed alarm set Nurse Communication: Other (comment) (needs new suction-fell on floor)  GO     Benito Mccreedy OTR/L [phone] 12/24/2013, 1:35 PM

## 2013-12-25 ENCOUNTER — Encounter (HOSPITAL_COMMUNITY)
Admission: RE | Disposition: A | Discharge status: Home Health | Payer: Self-pay | Source: Ambulatory Visit | Attending: Neurosurgery

## 2013-12-25 ENCOUNTER — Encounter (HOSPITAL_COMMUNITY): Payer: Self-pay | Admitting: *Deleted

## 2013-12-25 DIAGNOSIS — I10 Essential (primary) hypertension: Secondary | ICD-10-CM

## 2013-12-25 DIAGNOSIS — I6789 Other cerebrovascular disease: Secondary | ICD-10-CM

## 2013-12-25 HISTORY — PX: TEE WITHOUT CARDIOVERSION: SHX5443

## 2013-12-25 SURGERY — ECHOCARDIOGRAM, TRANSESOPHAGEAL
Anesthesia: Moderate Sedation

## 2013-12-25 MED ORDER — DIPHENHYDRAMINE HCL 50 MG/ML IJ SOLN
INTRAMUSCULAR | Status: AC
  Filled 2013-12-25: qty 1

## 2013-12-25 MED ORDER — BUTAMBEN-TETRACAINE-BENZOCAINE 2-2-14 % EX AERO
INHALATION_SPRAY | CUTANEOUS | Status: DC | PRN
Start: 2013-12-25 — End: 2013-12-25
  Administered 2013-12-25: 2 via TOPICAL

## 2013-12-25 MED ORDER — MIDAZOLAM HCL 10 MG/2ML IJ SOLN
INTRAMUSCULAR | Status: DC | PRN
  Administered 2013-12-25: 2 mg via INTRAVENOUS

## 2013-12-25 MED ORDER — MIDAZOLAM HCL 5 MG/ML IJ SOLN
INTRAMUSCULAR | Status: AC
Start: 2013-12-25 — End: ?
  Filled 2013-12-25: qty 2

## 2013-12-25 MED ORDER — FENTANYL CITRATE 0.05 MG/ML IJ SOLN
INTRAMUSCULAR | Status: AC
  Filled 2013-12-25: qty 2

## 2013-12-25 MED ORDER — FENTANYL CITRATE 0.05 MG/ML IJ SOLN
INTRAMUSCULAR | Status: DC | PRN
Start: 2013-12-25 — End: 2013-12-25
  Administered 2013-12-25: 25 ug via INTRAVENOUS

## 2013-12-25 NOTE — Progress Notes (Signed)
PT Cancellation Note  Patient Details Name: Bradley Sawyer MRN: [id number] DOB: June 02, 1956   Cancelled Treatment:    Reason Eval/Treat Not Completed: Patient at procedure or test/unavailable   [name] 12/25/2013, 10:21 AM

## 2013-12-25 NOTE — CV Procedure (Signed)
Procedure: TEE  Indication: CVA  Sedation: Versed 2 mg, Fentanyl 50 mcg  Findings: Please see echo section for full report.  Normal LV size and systolic function, EF 82-70%. Normal RV size and systolic function.  Mild LAE. No LAA thrombus noted.  No PFO/ASD.  Grade III plaque in the aortic arch/descending thoracic aorta.    Would consider loop recorder to assess for atrial fibrillation.   Loralie Champagne 12/25/2013

## 2013-12-25 NOTE — Progress Notes (Signed)
Stroke Team Progress Note  HISTORY 58 y.o. male with a past medical history significant for HTN, hyperlipidemia, smoking, cocaine use ( last use 1998), COPD, arthritis, who underwent uncomplicated L2 corpectomy with L1-L3 fusion 12/18/2013, and was noted to have left sided weakness 12/20/2013. He was last known well at 1200 12/20/2013, when he was ambulating with his physical therapist and was noted to be leaning to the left side. No reportred HA, vertigo, double vision, difficulty swallowing, slurred speech, language or vision impairment. Initial NIHSS 6. Unenhanced CT brain showed no acute abnormality. Appears to have embolic strokes on  R frontal, parietal and occipital lobes.  No hx of A-fib. He was not a tPA candidate secondary to recent surgery.   SUBJECTIVE Patient lying in the bed. No complaints.  OBJECTIVE Most recent Vital Signs: Filed Vitals:   12/25/13 1059 12/25/13 1100 12/25/13 1110 12/25/13 1111  BP:  157/73 157/73 139/81  Pulse: 67 69 72 71  Temp: 98.1 F (36.7 C)     TempSrc: Oral     Resp: 13 11 15 12   Height:      Weight:      SpO2: 92% 91% 94% 92%   CBG (last 3)  No results found for this basename: GLUCAP,  in the last 72 hours  IV Fluid Intake:   . sodium chloride    . sodium chloride 500 mL (12/25/13 0946)    MEDICATIONS  . amLODipine  5 mg Oral Daily  . aspirin EC  325 mg Oral Daily  . atorvastatin  40 mg Oral q1800  . bisacodyl  10 mg Rectal Daily  . esomeprazole  20 mg Oral Q1200  . FLUoxetine  10 mg Oral Daily  . gabapentin  600 mg Oral TID  . lisinopril  20 mg Oral Daily   And  . hydrochlorothiazide  25 mg Oral Daily  . meloxicam  15 mg Oral Daily  . mometasone-formoterol  2 puff Inhalation BID  . nicotine  21 mg Transdermal Daily  . omega-3 acid ethyl esters  2 g Oral BID  . senna  1 tablet Oral BID  . sodium chloride  3 mL Intravenous Q12H  . varenicline  0.5 mg Oral Daily   PRN:  acetaminophen, acetaminophen, albuterol, alum & mag  hydroxide-simeth, bisacodyl, diazepam, HYDROcodone-acetaminophen, HYDROmorphone (DILAUDID) injection, menthol-cetylpyridinium, nitroGLYCERIN, ondansetron (ZOFRAN) IV, oxyCODONE, oxyCODONE-acetaminophen, phenol, polyethylene glycol, sodium chloride, zolpidem  Diet:  NPO  Activity:  Up to bathroom DVT Prophylaxis:  SCDs  CLINICALLY SIGNIFICANT STUDIES Basic Metabolic Panel: No results found for this basename: NA, K, CL, CO2, GLUCOSE, BUN, CREATININE, CALCIUM, MG, PHOS,  in the last 168 hours Liver Function Tests: No results found for this basename: AST, ALT, ALKPHOS, BILITOT, PROT, ALBUMIN,  in the last 168 hours CBC: No results found for this basename: WBC, NEUTROABS, HGB, HCT, MCV, PLT,  in the last 168 hours Coagulation: No results found for this basename: LABPROT, INR,  in the last 168 hours Cardiac Enzymes: No results found for this basename: CKTOTAL, CKMB, CKMBINDEX, TROPONINI,  in the last 168 hours Urinalysis: No results found for this basename: COLORURINE, APPERANCEUR, LABSPEC, PHURINE, GLUCOSEU, HGBUR, BILIRUBINUR, KETONESUR, PROTEINUR, UROBILINOGEN, NITRITE, LEUKOCYTESUR,  in the last 168 hours Lipid Panel    Component Value Date/Time   CHOL 121 12/22/2013 0057   TRIG 142 12/22/2013 0057   HDL 41 12/22/2013 0057   CHOLHDL 3.0 12/22/2013 0057   VLDL 28 12/22/2013 0057   LDLCALC 52 12/22/2013 0057   HgbA1C  Lab Results  Component Value Date   HGBA1C 5.9* 12/21/2013    Urine Drug Screen:      Component Value Date/Time   LABOPIA NEG 01/31/2009 2108   COCAINSCRNUR NEG 01/31/2009 2108   LABBENZ NEG 01/31/2009 2108   AMPHETMU NEG 01/31/2009 2108    Alcohol Level: No results found for this basename: ETH,  in the last 168 hours  No results found.  CT of the brain    MRI of the brain  Multiple subcentimeter foci of acute to early subacute ischemia within the right frontal, parietal and occipital lobes, spanning multiple vascular territories. This could reflect embolic  phenomenon.  MRA of the brain  Less robust flow related enhancement of the right internal carotid artery which may reflect slow flow or even artifact with normal MRA of the intracranial vessels.  2D Echocardiogram  The estimated ejection fraction was in the range of 55% to 60%. No evidence of thrombus   Carotid Doppler  There is 1-39% right ICA stenosis, with abnormal flow signals. There is 40-59% left ICA stenosis, distally. Right vertebral artery flow is antegrade. Left vertebral artery flow is elevated.  EKG  normal EKG, normal sinus rhythm, unchanged from previous tracings. For complete results please see formal report.   Therapy Recommendations HH PT, HH OT, RW  Physical Exam  Mental Status:  Alert, awake, oriented x 4, thought content appropriate. Comprehension, naming, and repetition intact. Speech fluent without evidence of aphasia. Able to follow 3 step commands without difficulty.  Cranial Nerves:  II: Discs flat bilaterally; Visual fields grossly normal, pupils equal, round, reactive to light and accommodation  III,IV, VI: ptosis not present, extra-ocular motions intact bilaterally  V,VII: smile mildly asymmetric due to left nasolabial flattening, facial light touch sensation normal bilaterally  VIII: hearing normal bilaterally  IX,X: gag reflex present  XI: bilateral shoulder shrug  XII: midline tongue extension without atrophy or fasciculations  Motor:  LUE drift resolved 5/5 b/l Sensory: Pinprick and light touch slightly diminished in the left side.  Deep Tendon Reflexes:  Right: Upper Extremity Left: Upper extremity  biceps (C-5 to C-6) 2/4 biceps (C-5 to C-6) 2/4  tricep (C7) 2/4 triceps (C7) 2/4  Brachioradialis (C6) 2/4 Brachioradialis (C6) 2/4  Lower Extremity Lower Extremity  quadriceps (L-2 to L-4) 2/4 quadriceps (L-2 to L-4) 2/4  Achilles (S1) 2/4 Achilles (S1) 2/4  Plantars:  Right: downgoing Left: downgoing  Cerebellar:  Significant for mild dysmetria on  the left arm.  Gait:  Unable to test   ASSESSMENT Bradley Sawyer is a 58 y.o. male presenting with acute onset left hemiparesis, subtle left face weakness 2 days post lumbar surgery for compression fractures. MRI confirms embolic strokes R hemisphere in multiple vascular territories, etiology unclear.  TEE negative. Loop not considered this admission. On aspirin 81 mg orally every day prior to admission, though he had stopped taking in preparation for surgery. He is now on aspirin 325 mg orally every day for secondary stroke prevention. Patient with resultant left motor apraxia, left facial weakness. Work up completed.  hypertension Hyperlipidemia, LDL 52, on lovaza and crestor PTA, now on lipitor and lovaza, at goal LDL < 100 COPD Tobacco abuse Hx cocaine abuse, last use [facility] day # 7  TREATMENT/PLAN  Continue aspirin for secondary stroke prevention. No indication to add plavix.  Home health PT, OT and RW ( I ordered)   Patient has a 10-15% risk of having another stroke over the next year,  the highest risk is within 2 weeks of the most recent stroke/TIA (risk of having a stroke following a stroke or TIA is the same).  Ongoing risk factor control by Primary Care Physician  Stroke Service will sign off. Please call should any needs arise.  Will ask patient to schedule an outpatient TCD bubble study with emboli monitoring with Dr. Leonie Man in 1 month to further evaluate for possible PFO. (I have added to discharge instruction sheet). Will see Dr. Leonie Man at that time, Stroke Clinic, in  1 month  Bradley BIBY, MSN, Bradley Sawyer, Bradley Sawyer, Bradley Sawyer, [facility] Pager: [phone] 12/25/2013 11:52 AM  I have personally obtained a history, examined the patient, evaluated imaging results, and formulated the assessment and plan of care. I agree with the above.  Antony Contras, MD

## 2013-12-25 NOTE — Interval H&P Note (Signed)
History and Physical Interval Note:  12/25/2013 10:38 AM  Bradley Sawyer  has presented today for surgery, with the diagnosis of STROKE  The various methods of treatment have been discussed with the patient and family. After consideration of risks, benefits and other options for treatment, the patient has consented to  Procedure(s): TRANSESOPHAGEAL ECHOCARDIOGRAM (TEE) (N/A) as a surgical intervention .  The patient's history has been reviewed, patient examined, no change in status, stable for surgery.  I have reviewed the patient's chart and labs.  Questions were answered to the patient's satisfaction.     Bradley Sawyer [employer]

## 2013-12-25 NOTE — Progress Notes (Signed)
  Echocardiogram Echocardiogram Transesophageal has been performed.  Bradley Sawyer, [place] 12/25/2013, 10:59 AM

## 2013-12-25 NOTE — H&P (View-Only) (Signed)
Stroke Team Progress Note  HISTORY 58 y.o. male with a past medical history significant for HTN, hyperlipidemia, smoking, cocaine use ( last use 1998), COPD, arthritis, who underwent uncomplicated L2 corpectomy with L1-L3 fusion 12/18/2013, and was noted to have left sided weakness 12/20/2013. He was last known well at 1200 12/20/2013, when he was ambulating with his physical therapist and was noted to be leaning to the left side. No reportred HA, vertigo, double vision, difficulty swallowing, slurred speech, language or vision impairment. Initial NIHSS 6. Unenhanced CT brain showed no acute abnormality. Appears to have embolic strokes on  R frontal, parietal and occipital lobes.  No hx of A-fib. He was not a tPA candidate secondary to recent surgery.   SUBJECTIVE Patient lying in the bed. Helpful with history discussion with Dr. Leonie Man.  OBJECTIVE Most recent Vital Signs: Filed Vitals:   12/23/13 2122 12/24/13 0145 12/24/13 0604 12/24/13 0904  BP: 122/74 111/71 129/54 156/71  Pulse: 66 66 67 77  Temp: 97.9 F (36.6 C) 98.1 F (36.7 C) 97.9 F (36.6 C) 98.1 F (36.7 C)  TempSrc: Oral Oral Oral Oral  Resp: 20 20 22 22   Height:      Weight:      SpO2: 91% 91% 93% 90%   CBG (last 3)  No results found for this basename: GLUCAP,  in the last 72 hours  IV Fluid Intake:   . sodium chloride      MEDICATIONS  . amLODipine  5 mg Oral Daily  . aspirin EC  325 mg Oral Daily  . atorvastatin  40 mg Oral q1800  . bisacodyl  10 mg Rectal Daily  . esomeprazole  20 mg Oral Q1200  . FLUoxetine  10 mg Oral Daily  . gabapentin  600 mg Oral TID  . lisinopril  20 mg Oral Daily   And  . hydrochlorothiazide  25 mg Oral Daily  . meloxicam  15 mg Oral Daily  . mometasone-formoterol  2 puff Inhalation BID  . nicotine  21 mg Transdermal Daily  . omega-3 acid ethyl esters  2 g Oral BID  . senna  1 tablet Oral BID  . sodium chloride  3 mL Intravenous Q12H  . varenicline  0.5 mg Oral Daily   PRN:   acetaminophen, acetaminophen, albuterol, alum & mag hydroxide-simeth, bisacodyl, diazepam, HYDROcodone-acetaminophen, HYDROmorphone (DILAUDID) injection, menthol-cetylpyridinium, nitroGLYCERIN, ondansetron (ZOFRAN) IV, oxyCODONE, oxyCODONE-acetaminophen, phenol, polyethylene glycol, sodium chloride, zolpidem  Diet:  Dysphagia  Activity:  Bedrest DVT Prophylaxis:  SCDs  CLINICALLY SIGNIFICANT STUDIES Basic Metabolic Panel: No results found for this basename: NA, K, CL, CO2, GLUCOSE, BUN, CREATININE, CALCIUM, MG, PHOS,  in the last 168 hours Liver Function Tests: No results found for this basename: AST, ALT, ALKPHOS, BILITOT, PROT, ALBUMIN,  in the last 168 hours CBC: No results found for this basename: WBC, NEUTROABS, HGB, HCT, MCV, PLT,  in the last 168 hours Coagulation: No results found for this basename: LABPROT, INR,  in the last 168 hours Cardiac Enzymes: No results found for this basename: CKTOTAL, CKMB, CKMBINDEX, TROPONINI,  in the last 168 hours Urinalysis: No results found for this basename: COLORURINE, APPERANCEUR, LABSPEC, PHURINE, GLUCOSEU, HGBUR, BILIRUBINUR, KETONESUR, PROTEINUR, UROBILINOGEN, NITRITE, LEUKOCYTESUR,  in the last 168 hours Lipid Panel    Component Value Date/Time   CHOL 121 12/22/2013 0057   TRIG 142 12/22/2013 0057   HDL 41 12/22/2013 0057   CHOLHDL 3.0 12/22/2013 0057   VLDL 28 12/22/2013 0057   LDLCALC 52 12/22/2013 0057  HgbA1C  Lab Results  Component Value Date   HGBA1C 5.9* 12/21/2013    Urine Drug Screen:      Component Value Date/Time   LABOPIA NEG 01/31/2009 2108   COCAINSCRNUR NEG 01/31/2009 2108   LABBENZ NEG 01/31/2009 2108   AMPHETMU NEG 01/31/2009 2108    Alcohol Level: No results found for this basename: ETH,  in the last 168 hours  No results found.  CT of the brain    MRI of the brain  Multiple subcentimeter foci of acute to early subacute ischemia within the right frontal, parietal and occipital lobes, spanning multiple vascular  territories. This could reflect embolic phenomenon.  MRA of the brain  Less robust flow related enhancement of the right internal carotid artery which may reflect slow flow or even artifact with normal MRA of the intracranial vessels.  2D Echocardiogram  The estimated ejection fraction was in the range of 55% to 60%. No evidence of thrombus   Carotid Doppler  There is 1-39% right ICA stenosis, with abnormal flow signals. There is 40-59% left ICA stenosis, distally. Right vertebral artery flow is antegrade. Left vertebral artery flow is elevated.  CXR    EKG  normal EKG, normal sinus rhythm, unchanged from previous tracings. For complete results please see formal report.   Therapy Recommendations HH PT, HH OT, RW  Physical Exam  Mental Status:  Alert, awake, oriented x 4, thought content appropriate. Comprehension, naming, and repetition intact. Speech fluent without evidence of aphasia. Able to follow 3 step commands without difficulty.  Cranial Nerves:  II: Discs flat bilaterally; Visual fields grossly normal, pupils equal, round, reactive to light and accommodation  III,IV, VI: ptosis not present, extra-ocular motions intact bilaterally  V,VII: smile mildly asymmetric due to left nasolabial flattening, facial light touch sensation normal bilaterally  VIII: hearing normal bilaterally  IX,X: gag reflex present  XI: bilateral shoulder shrug  XII: midline tongue extension without atrophy or fasciculations  Motor:  LUE drift resolved 5/5 b/l Sensory: Pinprick and light touch slightly diminished in the left side.  Deep Tendon Reflexes:  Right: Upper Extremity Left: Upper extremity  biceps (C-5 to C-6) 2/4 biceps (C-5 to C-6) 2/4  tricep (C7) 2/4 triceps (C7) 2/4  Brachioradialis (C6) 2/4 Brachioradialis (C6) 2/4  Lower Extremity Lower Extremity  quadriceps (L-2 to L-4) 2/4 quadriceps (L-2 to L-4) 2/4  Achilles (S1) 2/4 Achilles (S1) 2/4  Plantars:  Right: downgoing Left: downgoing   Cerebellar:  Significant for mild dysmetria on the left arm.  Gait:  Unable to test   ASSESSMENT Mr. ELLIE SPICKLER is a 58 y.o. male presenting with acute onset left hemiparesis, subtle left face weakness 2 days post lumbar surgery for compression fractures. MRI confirms embolic strokes R hemisphere in multiple vascular territories, etiology unclear.  On aspirin 81 mg orally every day prior to admission, though he had stopped taking in preparation for surgery. He is now on aspirin 325 mg orally every day for secondary stroke prevention. Patient with resultant left motor apraxia, left facial weakness. Work up underway.  hypertension Hyperlipidemia, LDL 52, on lovaza and crestor PTA, now on lipitor and lovaza, at goal LDL < 100 COPD Tobacco abuse Hx cocaine abuse, last use [facility] day # 6  TREATMENT/PLAN  Continue aspirin for secondary stroke prevention. No indication to add plavix at this time.  TEE to look for embolic source. Arranged with [facility] for tomorrow.  If positive for PFO (patent foramen  ovale), check bilateral lower extremity venous dopplers to rule out DVT as possible source of stroke. (I have made patient NPO after midnight tonight).  Burnetta Sabin, MSN, RN, ANVP-BC, ANP-BC, [facility] Pager: [phone] 12/24/2013 5:44 PM  I have personally obtained a history, examined the patient, evaluated imaging results, and formulated the assessment and plan of care. I agree with the above. Antony Contras, MD

## 2013-12-25 NOTE — Progress Notes (Signed)
Occupational Therapy Treatment Patient Details Name: Bradley Sawyer MRN: [id number] DOB: Nov 20, 1956 Today's Date: 12/25/2013 Time: 7829-5621 OT Time Calculation (min): 49 min  OT Assessment / Plan / Recommendation  History of present illness Patient is a 58 year old male status post L2 fracture with intractable back pain and radiation into his right groin failing all conservative management. 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 Patient has failed conservative management. He presents now for anterior reconstruction with posterior percutaneous fixation.  On 12/21/13, patient with new Rt CVA - transferred to ICU. MRI head: Multiple subcentimeter foci of acute to early subacute   OT comments  Pt progressing with BADLs.  Lt. UE seems to be improving.  Pt with significant impairment with saccadic function of eyes and noted to run into items in environment.  Recommend no driving at discharge and HHOT  Follow Up Recommendations  Home health OT;Supervision/Assistance - 24 hour    Barriers to Discharge       Equipment Recommendations  None recommended by OT    Recommendations for Other Services    Frequency Min 2X/week   Progress towards OT Goals Progress towards OT goals: Progressing toward goals  Plan Discharge plan remains appropriate    Precautions / Restrictions Precautions Precautions: Back;Fall Precaution Booklet Issued: No Precaution Comments: Reviewed precautions with pt Required Braces or Orthoses: Spinal Brace Spinal Brace: Lumbar corset;Applied in sitting position   Pertinent Vitals/Pain     ADL  Toilet Transfer: Magazine features editor Method: Sit to Loss adjuster, chartered: Comfort height toilet;Grab bars Toileting - Clothing Manipulation and Hygiene: Performed;Min guard Where Assessed - Best boy and Hygiene: Sit to stand from 3-in-1 or toilet Equipment Used: Back  brace;Rolling walker Transfers/Ambulation Related to ADLs: min guard.  Pt noted to run into door frame on Lt.  ADL Comments:      OT Diagnosis:    OT Problem List:   OT Treatment Interventions:     OT Goals(current goals can now be found in the care plan section) ADL Goals Pt Will Perform Lower Body Bathing: with modified independence;with adaptive equipment;sit to/from stand Pt Will Perform Lower Body Dressing: with modified independence;with adaptive equipment;sit to/from stand Pt Will Transfer to Toilet: with modified independence;ambulating Pt Will Perform Toileting - Clothing Manipulation and hygiene: with modified independence;sit to/from stand Pt Will Perform Tub/Shower Transfer: Tub transfer;with supervision;ambulating;rolling walker;shower seat Additional ADL Goal #1: Pt will independently verbalize and demonstrate 3/3 back precautions.  Visit Information  Last OT Received On: 12/25/13 History of Present Illness: Patient is a 58 year old male status post L2 fracture with intractable back pain and radiation into his right groin failing all conservative management. 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 Patient has failed conservative management. He presents now for anterior reconstruction with posterior percutaneous fixation.  On 12/21/13, patient with new Rt CVA - transferred to ICU. MRI head: Multiple subcentimeter foci of acute to early subacute    Subjective Data      Prior Functioning       Cognition  Cognition Arousal/Alertness: Awake/alert Behavior During Therapy: Restless Overall Cognitive Status: Impaired/Different from baseline Area of Impairment: Safety/judgement General Comments: Pt initially     Mobility  Bed Mobility Overal bed mobility: Needs Assistance Bed Mobility: Rolling;Sidelying to Sit Rolling: Supervision Sidelying to sit: Supervision Sit to sidelying: Supervision General bed  mobility comments: cues to reinforce technique. Transfers Overall transfer level: Needs assistance Equipment used: Rolling  walker (2 wheeled) Transfers: Sit to/from [facility] Sit to Stand: Min guard Stand pivot transfers: Min guard General transfer comment: cues for safety and hand placement    Exercises      Balance    End of Session OT - End of Session Equipment Utilized During Treatment: Rolling walker;Back brace Activity Tolerance: Patient tolerated treatment well Patient left: in bed;with call bell/phone within reach;with bed alarm set  Walls, Jill Stopka M 12/25/2013, 5:29 PM

## 2013-12-25 NOTE — Progress Notes (Signed)
Overall stable. Results of the transesophageal echocardiogram pending. No new headaches or problems. Pain overall well controlled. Patient mobilizing without difficulty.  Afebrile. Vitals are stable. Examination stable.  Overall doing fairly well. Tentatively plan discharge tomorrow.

## 2013-12-26 ENCOUNTER — Encounter (HOSPITAL_COMMUNITY): Payer: Self-pay | Admitting: Cardiology

## 2013-12-26 MED ORDER — ASPIRIN 325 MG PO TBEC: 325.0000 mg | DELAYED_RELEASE_TABLET | Freq: Every day | ORAL | Status: DC

## 2013-12-26 NOTE — Plan of Care (Signed)
Dr Vertell Limber is here and notified MD that pt had widen QRS overnight and 8 beats of what seems to be like VTACH. No further orders received. Pt is eating breakfast at this time.

## 2013-12-26 NOTE — Plan of Care (Signed)
D/c instructions given to pt. V/u. Tele and iv removed. No med scripts. Pts family called office to find out about prescriptions. Waiting for Dr Annette Stable office to call back.

## 2013-12-26 NOTE — Progress Notes (Signed)
Talked to patient about [facility] choices; pt chose [facility] for HHPT/OT; [facility] with [facility] called for arrangements; [facility] with [facility] called for equipment; Aneta Mins [phone]

## 2013-12-26 NOTE — Progress Notes (Signed)
Physical Therapy Treatment Patient Details Name: Bradley Sawyer MRN: [id number] DOB: [birth year] Today's Date: 12/26/2013 Time: 0902-0926 PT Time Calculation (min): 24 min  PT Assessment / Plan / Recommendation  History of Present Illness Patient is a 58 year old male status post L2 fracture with intractable back pain and radiation into his right groin failing all conservative management. 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 Patient has failed conservative management. He presents now for anterior reconstruction with posterior percutaneous fixation.  On 12/21/13, patient with new Rt CVA - transferred to ICU.   PT Comments   Patient progressing well, performed stair negotiation, increased ambulation, and educated on mobility expectations and simulated care transfer for dc home.   Follow Up Recommendations  Home health PT;Supervision/Assistance - 24 hour     Does the patient have the potential to tolerate intense rehabilitation     Barriers to Discharge        Equipment Recommendations  Rolling walker with 5" wheels    Recommendations for Other Services    Frequency Min 5X/week   Progress towards PT Goals Progress towards PT goals: Progressing toward goals  Plan Current plan remains appropriate    Precautions / Restrictions Precautions Precautions: Back;Fall Precaution Comments: pt able ro recall 2/3 back preacutions; requries cues during session multiple times to adhere to precautions  Required Braces or Orthoses: Spinal Brace Spinal Brace: Lumbar corset;Applied in sitting position Restrictions Weight Bearing Restrictions: No   Pertinent Vitals/Pain 4/10    Mobility  Bed Mobility Overal bed mobility: Needs Assistance Bed Mobility: Rolling;Sidelying to Sit Rolling: Supervision Sidelying to sit: Supervision General bed mobility comments: pt intially twisting to get OOB; requried max cues to slow down and  perform Log rolling technique to adhere to back precautions; pt very impulsive Transfers Overall transfer level: Modified independent Equipment used: Rolling walker (2 wheeled) Ambulation/Gait Ambulation/Gait assistance: Supervision Ambulation Distance (Feet): 640 Feet Assistive device: Rolling walker (2 wheeled) Gait velocity: decreased General Gait Details: pt drifts at times; primarily to Lt and with head turns; pt able to avoid objects in hallway but continues to have difficulty managing RW throughout room and in smaller quarters; cues for sequencing and upright posture; min guard to steady with ambulation  Stairs: Yes Stairs assistance: Supervision Stair Management: One rail Right;Step to pattern;Forwards Number of Stairs: 8      PT Goals (current goals can now be found in the care plan section) Acute Rehab PT Goals Patient Stated Goal: home to do this stuff PT Goal Formulation: With patient Time For Goal Achievement: 12/26/13 Potential to Achieve Goals: Good  Visit Information  Last PT Received On: 12/26/13 Assistance Needed: +1 History of Present Illness: Patient is a 58 year old male status post L2 fracture with intractable back pain and radiation into his right groin failing all conservative management. 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 Patient has failed conservative management. He presents now for anterior reconstruction with posterior percutaneous fixation.  On 12/21/13, patient with new Rt CVA - transferred to ICU.    Subjective Data  Subjective: I am ready to get out of here Patient Stated Goal: home to do this stuff   Cognition  Cognition Arousal/Alertness: Awake/alert Behavior During Therapy: WFL for tasks assessed/performed Overall Cognitive Status: Within Functional Limits for tasks assessed       End of Session PT - End of Session Equipment Utilized During Treatment: Gait belt;Back  brace Activity Tolerance: Patient limited by  fatigue;Patient limited by pain Patient left: in chair;with call bell/phone within reach Nurse Communication: Mobility status;Patient requests pain meds   GP     [name] 12/26/2013, 1:26 PM [name], [place] DPT  [phone]

## 2014-01-01 ENCOUNTER — Encounter: Payer: No Typology Code available for payment source | Admitting: Internal Medicine

## 2014-01-02 NOTE — Addendum Note (Signed)
Addended by: Hulan Fray on: 01/02/2014 06:10 PM   Modules accepted: Orders

## 2014-01-07 ENCOUNTER — Telehealth: Payer: Self-pay | Admitting: *Deleted

## 2014-01-07 NOTE — Telephone Encounter (Signed)
Pt called and left a message that he feels he needs more prozac because it doesn't seem to be working well, triage attempted to call pt back left vmail for him

## 2014-02-05 ENCOUNTER — Encounter: Payer: No Typology Code available for payment source | Admitting: Internal Medicine

## 2014-02-19 ENCOUNTER — Encounter: Payer: Self-pay | Admitting: Internal Medicine

## 2014-02-19 ENCOUNTER — Ambulatory Visit (INDEPENDENT_AMBULATORY_CARE_PROVIDER_SITE_OTHER): Payer: Medicaid Other | Admitting: Internal Medicine

## 2014-02-19 VITALS — BP 115/72 | HR 79 | Temp 97.0°F | Wt 210.3 lb

## 2014-02-19 DIAGNOSIS — I10 Essential (primary) hypertension: Secondary | ICD-10-CM

## 2014-02-19 DIAGNOSIS — J449 Chronic obstructive pulmonary disease, unspecified: Secondary | ICD-10-CM

## 2014-02-19 DIAGNOSIS — M545 Low back pain, unspecified: Secondary | ICD-10-CM

## 2014-02-19 DIAGNOSIS — S32020A Wedge compression fracture of second lumbar vertebra, initial encounter for closed fracture: Secondary | ICD-10-CM

## 2014-02-19 DIAGNOSIS — F17200 Nicotine dependence, unspecified, uncomplicated: Secondary | ICD-10-CM

## 2014-02-19 DIAGNOSIS — J4489 Other specified chronic obstructive pulmonary disease: Secondary | ICD-10-CM

## 2014-02-19 MED ORDER — FLUTICASONE-SALMETEROL 500-50 MCG/DOSE IN AEPB: 1.0000 | INHALATION_SPRAY | Freq: Two times a day (BID) | RESPIRATORY_TRACT | Status: DC

## 2014-02-19 MED ORDER — MELOXICAM 15 MG PO TABS: 15.0000 mg | ORAL_TABLET | Freq: Every day | ORAL | Status: DC

## 2014-02-19 MED ORDER — OXYCODONE-ACETAMINOPHEN 5-325 MG PO TABS: 1.0000 | ORAL_TABLET | Freq: Four times a day (QID) | ORAL | Status: DC | PRN

## 2014-02-19 MED ORDER — ESOMEPRAZOLE MAGNESIUM 20 MG PO CPDR: 20.0000 mg | DELAYED_RELEASE_CAPSULE | Freq: Every day | ORAL | Status: DC

## 2014-02-19 MED ORDER — OMEGA-3-ACID ETHYL ESTERS 1 G PO CAPS: 2.0000 g | ORAL_CAPSULE | Freq: Two times a day (BID) | ORAL | Status: DC

## 2014-02-19 MED ORDER — QUINAPRIL-HYDROCHLOROTHIAZIDE 20-25 MG PO TABS
1.0000 | ORAL_TABLET | Freq: Every day | ORAL | Status: DC
Start: 2014-02-19 — End: 2014-04-02

## 2014-02-19 MED ORDER — FLUOXETINE HCL 10 MG PO CAPS: 10.0000 mg | ORAL_CAPSULE | Freq: Every day | ORAL | Status: DC

## 2014-02-19 NOTE — Assessment & Plan Note (Signed)
BP Readings from Last 3 Encounters:  02/19/14 115/72  12/26/13 153/84  12/26/13 153/84    Lab Results  Component Value Date   NA 143 12/14/2013   K 3.9 12/14/2013   CREATININE 0.86 12/14/2013    Assessment: Blood pressure control: controlled Progress toward BP goal:  at goal Comments: Patient is taking his meds as prescribed and his BP is well controlled.  Plan: Medications:  continue current medications Educational resources provided: brochure Self management tools provided: home blood pressure logbook Other plans: F/U in 2/3 months

## 2014-02-19 NOTE — Assessment & Plan Note (Signed)
Appears stable. Continue current treatment. Encouraged patient to cease smoking. He states that he is cutting back.

## 2014-02-19 NOTE — Assessment & Plan Note (Addendum)
The patients low back pain is likely due to lumbar radiculopathy. Back surgery was attempted, but the patient remains in significant pain. I have continued the patients pain medicines until he can see Dr. Annette Stable. I spoke with Dr. Maryruth Eve at [facility] regarding this patient. He agreed to have the patient seen at [facility]. I have referred the patient to [facility]. The patient is in agreement with this plan.  Current pain regiment:  - Meloxicam 15 mg qd - Gabapentin 600 mg TID - Hydrocodone-APAP 5-325 mg 1-2 tablets q 6 prn (takes 8-12 tablets per day) (Prescribe 180 tablets for 2.5 weeks)

## 2014-02-19 NOTE — Progress Notes (Signed)
Case discussed with Dr. Komanski at time of visit.  We reviewed the resident's history and exam and pertinent patient test results.  I agree with the assessment, diagnosis, and plan of care documented in the resident's note. 

## 2014-02-19 NOTE — Assessment & Plan Note (Signed)
Patient reports that he did not tolerate Chantix. I removed this from his medications list. He states that he has cut back is trying to quit. I encouraged him.

## 2014-02-19 NOTE — Patient Instructions (Signed)
I have given you a two week supply of pain medicines to help cover your pain until your appointment with Dr. Trenton Gammon.   I have referred you to [facility]. We will call you when you get an appointment with them.  Please follow up with me in 2-3 months.   Please call for new or worsening symptoms.
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
## 2014-04-01 ENCOUNTER — Telehealth: Payer: Self-pay | Admitting: *Deleted

## 2014-04-01 NOTE — Telephone Encounter (Signed)
Call from pt stating he was unable to get his quinapril/hctz 20-25mg  and his nexium 20mg  tabs from [facility] on [address].  Pt recently obtained medicaid and both meds are non-preferred.    PREFERRED ACE INHIBITOR/ DIURETIC COMBINATIONS:     captopril/HCTZ (generic for Capozide)   benazepril/HCTZ (generic for Lotensin HCT)   enalapril/HCTZ (generic for Vaseretic)   lisinopril/HCTZ (generic for Prinzide and Zestoretic)    PREFERRED PROTON PUMP INHIBITORS:    omeprazole RX capsule (generic for Prilosec Capsule)   pantoprazole (generic for Protonix)   Prilosec OTC   Protonix Suspension   Will forward info to pcp for review and medication changes in necessary.  Of note, pt states he has taken prilosec and benazepril in the past without any problems.  Please advise.

## 2014-04-02 ENCOUNTER — Other Ambulatory Visit: Payer: Self-pay | Admitting: Internal Medicine

## 2014-04-02 MED ORDER — LISINOPRIL-HYDROCHLOROTHIAZIDE 20-12.5 MG PO TABS: 1.0000 | ORAL_TABLET | Freq: Every day | ORAL | Status: DC

## 2014-04-02 MED ORDER — PANTOPRAZOLE SODIUM 40 MG PO TBEC: 40.0000 mg | DELAYED_RELEASE_TABLET | Freq: Every day | ORAL | Status: DC

## 2014-04-09 ENCOUNTER — Encounter: Payer: Self-pay | Admitting: Internal Medicine

## 2014-04-09 ENCOUNTER — Ambulatory Visit (INDEPENDENT_AMBULATORY_CARE_PROVIDER_SITE_OTHER): Payer: Medicaid Other | Admitting: Internal Medicine

## 2014-04-09 VITALS — BP 127/76 | HR 55 | Temp 97.9°F | Wt 215.6 lb

## 2014-04-09 DIAGNOSIS — R195 Other fecal abnormalities: Secondary | ICD-10-CM

## 2014-04-09 DIAGNOSIS — I639 Cerebral infarction, unspecified: Secondary | ICD-10-CM

## 2014-04-09 DIAGNOSIS — S32020A Wedge compression fracture of second lumbar vertebra, initial encounter for closed fracture: Secondary | ICD-10-CM

## 2014-04-09 DIAGNOSIS — I10 Essential (primary) hypertension: Secondary | ICD-10-CM

## 2014-04-09 DIAGNOSIS — X58XXXA Exposure to other specified factors, initial encounter: Secondary | ICD-10-CM

## 2014-04-09 DIAGNOSIS — E785 Hyperlipidemia, unspecified: Secondary | ICD-10-CM

## 2014-04-09 DIAGNOSIS — R0989 Other specified symptoms and signs involving the circulatory and respiratory systems: Secondary | ICD-10-CM

## 2014-04-09 DIAGNOSIS — S32009A Unspecified fracture of unspecified lumbar vertebra, initial encounter for closed fracture: Secondary | ICD-10-CM

## 2014-04-09 DIAGNOSIS — Z8673 Personal history of transient ischemic attack (TIA), and cerebral infarction without residual deficits: Secondary | ICD-10-CM

## 2014-04-09 DIAGNOSIS — R6889 Other general symptoms and signs: Secondary | ICD-10-CM

## 2014-04-09 MED ORDER — AMLODIPINE BESYLATE 10 MG PO TABS: 10.0000 mg | ORAL_TABLET | Freq: Every day | ORAL | Status: DC

## 2014-04-09 MED ORDER — GABAPENTIN 400 MG PO CAPS: 800.0000 mg | ORAL_CAPSULE | Freq: Three times a day (TID) | ORAL | Status: DC

## 2014-04-09 MED ORDER — PRAVASTATIN SODIUM 20 MG PO TABS: 20.0000 mg | ORAL_TABLET | Freq: Every day | ORAL | Status: DC

## 2014-04-09 NOTE — Patient Instructions (Signed)
Thank you for bringing your medicines today. This helps [place] keep you safe from mistakes.  Please take the increased dose of amlodipine 10 mg daily.  Please take the cholesterol medicine.  Please follow up with the stroke doctors with the appointment that we are making for you.  Please follow up with the GI doctors so you can get the colonoscopy.

## 2014-04-09 NOTE — Progress Notes (Signed)
Patient ID: Bradley Sawyer, male   DOB: 19-Nov-1956, 58 y.o.   MRN: [id number]    Subjective:   Patient ID: Bradley Sawyer male   DOB: 1956-02-03 58 y.o.   MRN: [id number]  HPI: [name] is a 58 y.o. man who presents for routine follow up of his chronic medical problems. See problem based charting below.    Past Medical History  Diagnosis Date  . Hyperlipidemia   . Hypertension   . GERD (gastroesophageal reflux disease)   . Tobacco abuse   . COPD (chronic obstructive pulmonary disease)     bronchitis  . Disorder of vocal cord   . Cocaine dependence     Last use 1998, attends 3 NA meetings weekly.  . Right shoulder pain     2/2 partial rotator cuff tear, tendinopathy, mild subacromial/subdeltioid bursitis, A/C joint arthropathy per MRI done in 3/07  . Arthritis   . Anxiety    Current Outpatient Prescriptions  Medication Sig Dispense Refill  . albuterol (PROVENTIL HFA;VENTOLIN HFA) 108 (90 BASE) MCG/ACT inhaler Inhale 2 puffs into the lungs every 6 (six) hours as needed for wheezing.  3 Inhaler  4  . amLODipine (NORVASC) 5 MG tablet Take 1 tablet (5 mg total) by mouth daily.  30 tablet  11  . aspirin EC 325 MG EC tablet Take 1 tablet (325 mg total) by mouth daily.  30 tablet  0  . aspirin EC 81 MG tablet Take 243 mg by mouth daily.      . diazepam (VALIUM) 5 MG tablet Take 1-2 tablets (5-10 mg total) by mouth every 6 (six) hours as needed for muscle spasms.  60 tablet  0  . FLUoxetine (PROZAC) 10 MG capsule Take 1 capsule (10 mg total) by mouth daily.  90 capsule  4  . Fluticasone-Salmeterol (ADVAIR) 500-50 MCG/DOSE AEPB Inhale 1 puff into the lungs every 12 (twelve) hours.  60 each  4  . gabapentin (NEURONTIN) 300 MG capsule Take 2 capsules (600 mg total) by mouth 3 (three) times daily.  180 capsule  4  . lisinopril-hydrochlorothiazide (PRINZIDE) 20-12.5 MG per tablet Take 1 tablet by mouth daily.  30 tablet  6  . meloxicam (MOBIC) 15 MG tablet Take 1 tablet (15 mg  total) by mouth daily.  30 tablet  1  . nitroGLYCERIN (NITROSTAT) 0.4 MG SL tablet Place 1 tablet (0.4 mg total) under the tongue every 5 (five) minutes as needed for chest pain.  25 tablet  8  . omega-3 acid ethyl esters (LOVAZA) 1 G capsule Take 2 capsules (2 g total) by mouth 2 (two) times daily.  90 capsule  3  . oxyCODONE-acetaminophen (ROXICET) 5-325 MG per tablet Take 1-2 tablets by mouth every 6 (six) hours as needed.  180 tablet  0  . pantoprazole (PROTONIX) 40 MG tablet Take 1 tablet (40 mg total) by mouth daily.  30 tablet  6  . rosuvastatin (CRESTOR) 20 MG tablet Take 1 tablet (20 mg total) by mouth at bedtime.  30 tablet  11   No current facility-administered medications for this visit.   Family History  Problem Relation Age of Onset  . Coronary artery disease      Male 1st degree relatives<60  . Diabetes      1 st degree relatives   History   Social History  . Marital Status: Divorced    Spouse Name: N/A    Number of Children: N/A  . Years of Education: N/A  Social History Main Topics  . Smoking status: Current Every Day Smoker -- 1.00 packs/day for 48 years    Types: Cigarettes  . Smokeless tobacco: Never Used     Comment: Started back x 2 weeks.  . Alcohol Use: No  . Drug Use: Yes    Special: Cocaine, Marijuana     Comment: over 3 yrs since last use  . Sexual Activity: None   Other Topics Concern  . None   Social History Narrative  . None   Review of Systems:  Review of Systems  Constitutional: Negative for fever, chills, weight loss and malaise/fatigue.  HENT: Negative for sore throat.   Eyes: Negative for blurred vision, photophobia and pain.  Respiratory: Positive for cough. Negative for hemoptysis, sputum production and shortness of breath.   Cardiovascular: Negative for chest pain, palpitations and orthopnea.  Gastrointestinal: Negative for heartburn, nausea, vomiting and abdominal pain.  Musculoskeletal: Positive for back pain and myalgias.  Negative for falls, joint pain and neck pain.  Neurological: Negative for dizziness, tingling, tremors, sensory change, focal weakness, weakness and headaches.     Objective:  Physical Exam: Filed Vitals:   04/09/14 1324  BP: 156/90  Pulse: 69  Temp: 97.9 F (36.6 C)  TempSrc: Oral  Weight: 215 lb 9.6 oz (97.796 kg)  SpO2: 96%   Physical Exam  Constitutional: He is oriented to person, place, and time. He appears well-developed and well-nourished. No distress.  HENT:  Head: Atraumatic.  Mouth/Throat: Oropharynx is clear and moist.  Cardiovascular: Normal rate, regular rhythm, normal heart sounds and intact distal pulses.  Exam reveals no friction rub.   No murmur heard. Pulmonary/Chest: Effort normal. No respiratory distress. He has wheezes. He has no rales.  Neurological: He is alert and oriented to person, place, and time.  Skin: He is not diaphoretic.  Psychiatric: He has a normal mood and affect. His behavior is normal.    Assessment & Plan:

## 2014-04-10 ENCOUNTER — Telehealth: Payer: Self-pay | Admitting: Licensed Clinical Social Worker

## 2014-04-10 ENCOUNTER — Encounter: Payer: Self-pay | Admitting: Licensed Clinical Social Worker

## 2014-04-10 DIAGNOSIS — R0989 Other specified symptoms and signs involving the circulatory and respiratory systems: Secondary | ICD-10-CM | POA: Insufficient documentation

## 2014-04-10 DIAGNOSIS — I639 Cerebral infarction, unspecified: Secondary | ICD-10-CM | POA: Insufficient documentation

## 2014-04-10 NOTE — Telephone Encounter (Signed)
Bradley Sawyer was referred to CSW for transportation issues [place] of county.  Pt is covered by [employer] and would be eligible for [facility] transportation.  CSW placed call to Mr. Guthmiller and discuss, pt was unaware of this services.  CSW mailed letter and brochure, along with out of county forms.  CSW will sign off.

## 2014-04-10 NOTE — Assessment & Plan Note (Signed)
The patient has continued low back pan that radiates to his right groin/ testicle. I have previously referred the patient to [place] pain clinics. He was refused likely due to his remote (17 years ago) history of narcotics use. The patient is active in NA and has had no signs of misuse of opioid pain medications that I have previously prescribed. After his surgery in early 2015, he had worsening low back pain. I personally contacted a pain clinic in [facility] that I have a personal connection with. They agreed to see the patient and an appointment was scheduled. The patient decided to cancel that appointment because he did not want to drive his care, with over 300,000 miles to [place] every month for refills. I explained to the patient that I am currently unable to fill his opioid pain medications. Currently, he will be receiveing his opioids from his surgeon Dr. Annette Stable. I believe that once the patients pain is controlled on a stable dose of opioids that the patient may complete a pain contract with the [facility]. Of note, the patient was offered consultation with our social worker for access to transportation to appointments. The patient refused this.  Today, I increased the patients gabapentin from 1800 mg qd to 2400 mg qd. Patient will f/u with Dr. Annette Stable regarding his opioid prescription at this time.

## 2014-04-10 NOTE — Assessment & Plan Note (Signed)
Patient is now agreable to colonoscopy referred to GI for screening colonoscopy.

## 2014-04-10 NOTE — Assessment & Plan Note (Signed)
The patient was found to have unequal blood pressure in his arms. The blood pressure in his right arm was ~ 30 mmHg less than the left. I am concerned about possible peripheral arterial disease. The patient has no symptoms at this time. I recommended to start work up with bil. Arterial US upper extremity.

## 2014-04-10 NOTE — Assessment & Plan Note (Signed)
The patient had a post-operative CVA earlier this year. He was recommended to have follow up with neurology regarding this problem. He has never been able to follow up. I have referred the patient to neurology for evaluation for possible holter monitor vs loop recorder as patients CVA was concerning for embolic source. May benefit for ruling out paroxysmal atrial fibrillation.

## 2014-04-10 NOTE — Assessment & Plan Note (Signed)
Patient reports running out of his statin. This was reordered.

## 2014-04-10 NOTE — Assessment & Plan Note (Signed)
BP Readings from Last 3 Encounters:  04/09/14 127/76  02/19/14 115/72  12/26/13 153/84    Lab Results  Component Value Date   NA 143 12/14/2013   K 3.9 12/14/2013   CREATININE 0.86 12/14/2013    Assessment: Blood pressure control: mildly elevated Progress toward BP goal:  deteriorated Comments: The patients BP is elevated in left arm and significantly lower in the right arm.  Plan: Medications:  Increased the amlodipine to 10 mg daily. Other plans: See unequal blood pressure in arms.

## 2014-04-11 NOTE — Progress Notes (Signed)
Case discussed with Dr. Komanski at the time of the visit.  We reviewed the resident's history and exam and pertinent patient test results.  I agree with the assessment, diagnosis, and plan of care documented in the resident's note.      

## 2014-04-16 ENCOUNTER — Other Ambulatory Visit: Payer: Self-pay | Admitting: Neurology

## 2014-04-16 DIAGNOSIS — I6350 Cerebral infarction due to unspecified occlusion or stenosis of unspecified cerebral artery: Secondary | ICD-10-CM

## 2014-04-17 DIAGNOSIS — N50811 Right testicular pain: Secondary | ICD-10-CM | POA: Insufficient documentation

## 2014-04-17 DIAGNOSIS — N35919 Unspecified urethral stricture, male, unspecified site: Secondary | ICD-10-CM | POA: Insufficient documentation

## 2014-04-17 DIAGNOSIS — R351 Nocturia: Secondary | ICD-10-CM | POA: Insufficient documentation

## 2014-04-29 ENCOUNTER — Other Ambulatory Visit: Payer: Self-pay | Admitting: Internal Medicine

## 2014-04-29 ENCOUNTER — Encounter: Payer: Self-pay | Admitting: Internal Medicine

## 2014-04-29 ENCOUNTER — Ambulatory Visit (INDEPENDENT_AMBULATORY_CARE_PROVIDER_SITE_OTHER): Payer: Medicaid Other | Admitting: Internal Medicine

## 2014-04-29 VITALS — BP 134/70 | HR 72 | Ht 71.0 in | Wt 217.2 lb

## 2014-04-29 DIAGNOSIS — Z1211 Encounter for screening for malignant neoplasm of colon: Secondary | ICD-10-CM

## 2014-04-29 DIAGNOSIS — R195 Other fecal abnormalities: Secondary | ICD-10-CM

## 2014-04-29 MED ORDER — MOVIPREP 100 G PO SOLR: 1.0000 | Freq: Once | ORAL | Status: DC

## 2014-04-29 NOTE — Progress Notes (Signed)
HISTORY OF PRESENT ILLNESS:  Bradley Sawyer is a 58 y.o. male with multiple medical problems as listed below. He is sent today regarding remote Hemoccult-positive stool and the need for colonoscopy. Review of the computer shows Hemoccult-positive stool from June of 2013. Hemoglobin have been normal. His GI review of systems is negative except for occasional heartburn. No prior history of GI evaluations. His chronic medical problems are stable  REVIEW OF SYSTEMS:  All non-GI ROS negative except for anxiety, arthritis, back pain, depression, cough, hearing problems, shortness of breath  Past Medical History  Diagnosis Date  . Hyperlipidemia   . Hypertension   . GERD (gastroesophageal reflux disease)   . Tobacco abuse   . COPD (chronic obstructive pulmonary disease)     bronchitis  . Disorder of vocal cord   . Cocaine dependence     Last use 1998, attends 3 NA meetings weekly.  . Right shoulder pain     2/2 partial rotator cuff tear, tendinopathy, mild subacromial/subdeltioid bursitis, A/C joint arthropathy per MRI done in 3/07  . Arthritis   . Anxiety   . Compression fracture     L2  . CVA (cerebral infarction)   . Fatty liver     Past Surgical History  Procedure Laterality Date  . Neck surgery    . Lumbar spine surgery  12/18/2013    L2     DR POOL  . Anterior lat lumbar fusion N/A 12/18/2013    Procedure: Lumbar Two Anteriorlateral Corpectomy w/ cage, interbody fusion, anterior plating, Lumbar One to Lumbar Three percutaneous pedicle screws;  Surgeon: Charlie Pitter, MD;  Location: [facility];  Service: Neurosurgery;  Laterality: N/A;  . Tee without cardioversion N/A 12/25/2013    Procedure: TRANSESOPHAGEAL ECHOCARDIOGRAM (TEE);  Surgeon: Larey Dresser, MD;  Location: [facility];  Service: Cardiovascular;  Laterality: N/A;    Social History Abdifatah Colquhoun Dodge  reports that he has been smoking Cigarettes.  He has a 48 pack-year smoking history. He has never used smokeless  tobacco. He reports that he uses illicit drugs (Cocaine and Marijuana). He reports that he does not drink alcohol.  family history includes Diabetes in his brother and paternal grandmother; Heart disease in his brother and father; Muscular dystrophy in his brother and sister.  No Known Allergies     PHYSICAL EXAMINATION: Vital signs: BP 134/70  Pulse 72  Ht 5\' 11"  (1.803 m)  Wt 217 lb 3.2 oz (98.521 kg)  BMI 30.31 kg/m2  Constitutional: generally well-appearing, no acute distress Psychiatric: alert and oriented x3, cooperative Eyes: extraocular movements intact, anicteric, conjunctiva pink Mouth: oral pharynx moist, no lesions Neck: supple no lymphadenopathy Cardiovascular: heart regular rate and rhythm, no murmur Lungs: clear to auscultation bilaterally Abdomen: soft, nontender, nondistended, no obvious ascites, no peritoneal signs, normal bowel sounds, no organomegaly Rectal: Deferred until colonoscopy Extremities: no lower extremity edema bilaterally Skin: no lesions on visible extremities. Multiple tattoos Neuro: No focal deficits.   ASSESSMENT:  #1. Remote Hemoccult-positive stool #2. GERD without alarm features #3. Screening colonoscopy. Appropriate candidate without contraindication   PLAN:  #1. Colonoscopy. Movi prep prescribed. Patient instructed on its use. The nature of the procedure, as well as the risks, benefits, and alternatives were carefully and thoroughly reviewed with the patient. Ample time for discussion and questions allowed. The patient understood, was satisfied, and agreed to proceed.

## 2014-04-29 NOTE — Patient Instructions (Addendum)
You have been given a separate informational sheet regarding your tobacco use, the importance of quitting and local resources to help you quit. You have been scheduled for a colonoscopy with propofol. Please follow written instructions given to you at your visit today.  Please pick up your prep kit at the pharmacy within the next 1-3 days. If you use inhalers (even only as needed), please bring them with you on the day of your procedure. Your physician has requested that you go to www.startemmi.com and enter the access code given to you at your visit today. This web site gives a general overview about your procedure. However, you should still follow specific instructions given to you by our office regarding your preparation for the procedure. 

## 2014-05-03 ENCOUNTER — Ambulatory Visit (INDEPENDENT_AMBULATORY_CARE_PROVIDER_SITE_OTHER): Payer: Medicaid Other | Admitting: Neurology

## 2014-05-03 ENCOUNTER — Ambulatory Visit (INDEPENDENT_AMBULATORY_CARE_PROVIDER_SITE_OTHER): Payer: Self-pay

## 2014-05-03 ENCOUNTER — Encounter: Payer: Self-pay | Admitting: Neurology

## 2014-05-03 VITALS — BP 137/83 | HR 66

## 2014-05-03 DIAGNOSIS — I6350 Cerebral infarction due to unspecified occlusion or stenosis of unspecified cerebral artery: Secondary | ICD-10-CM

## 2014-05-03 DIAGNOSIS — Z0289 Encounter for other administrative examinations: Secondary | ICD-10-CM

## 2014-05-03 DIAGNOSIS — I6340 Cerebral infarction due to embolism of unspecified cerebral artery: Secondary | ICD-10-CM

## 2014-05-03 DIAGNOSIS — I639 Cerebral infarction, unspecified: Secondary | ICD-10-CM

## 2014-05-03 NOTE — Progress Notes (Signed)
[name] [address] [place]. [place] [address] [phone]       OFFICE FOLLOW-UP  & TCD Bubble Study Visit NOTE  [name] Date of Birth:  Jan 23, 1956 Medical Record Number:  [id number]   HPI: 58 year male seen today for first office followup visit after hospital admission for stroke in January 2015. He underwent uncomplicated L2 corpectomy with L1-L3 fusion on 12/18/13. 2 days later he was noted to have some left-sided weakness. While in the waiting with physical therapy as he was noted to be leaning to the left side. He denied any complaints symptoms. CT scan was unremarkable. NIH stroke scale was 6. MRI scan showed scattered embolic strokes involving right frontal, parietal and occipital lobes. He had no known history of rectal fibrillation. Hemoglobin A1c was 5.9. Total cholesterol was 121, HDL 41 and LDL 52 mg percent. Urine drug screen was negative. MRI of the brain showed no large vessel stenosis. Transthoracic echo showed normal ejection fraction. Carotid Doppler showed mild 1-39% right ICA and 49% left ICA stenosis. EKG and telemetry monitoring revealed sinus rhythm. Patient was started on aspirin for stroke prevention and consult to quit tobacco. He had remote history of cocaine abuse but had quit in 1998. Patient was discharged home with home therapy and states has done well his left-sided weakness has normalized. He is no residual deficits. He still has some back pain which is his main limiting factor. He is back to his neurological baseline. He is here today for TCD bubble study.  ROS:   14 system review of systems is positive for shortness of breath, cough, wheezing, anxiety, depression and all other systems negative  PMH:  Past Medical History  Diagnosis Date  . Hyperlipidemia   . Hypertension   . GERD (gastroesophageal reflux disease)   . Tobacco abuse   . COPD (chronic obstructive pulmonary disease)     bronchitis  . Disorder of vocal cord    . Cocaine dependence     Last use 1998, attends 3 NA meetings weekly.  . Right shoulder pain     2/2 partial rotator cuff tear, tendinopathy, mild subacromial/subdeltioid bursitis, A/C joint arthropathy per MRI done in 3/07  . Arthritis   . Anxiety   . Compression fracture     L2  . CVA (cerebral infarction)   . Fatty liver     Social History:  History   Social History  . Marital Status: Divorced    Spouse Name: N/A    Number of Children: 1  . Years of Education: N/A   Occupational History  . Disabled    Social History Main Topics  . Smoking status: Current Every Day Smoker -- 1.00 packs/day for 48 years    Types: Cigarettes  . Smokeless tobacco: Never Used     Comment: Started back x 2 weeks.  . Alcohol Use: No  . Drug Use: Yes    Special: Cocaine, Marijuana     Comment: over 3 yrs since last use  . Sexual Activity: Not on file   Other Topics Concern  . Not on file   Social History Narrative  . No narrative on file    Medications:   Current Outpatient Prescriptions on File Prior to Visit  Medication Sig Dispense Refill  . albuterol (PROVENTIL HFA;VENTOLIN HFA) 108 (90 BASE) MCG/ACT inhaler Inhale 2 puffs into the lungs every 6 (six) hours as needed for wheezing.  3 Inhaler  4  . amLODipine (NORVASC) 10  MG tablet Take 1 tablet (10 mg total) by mouth daily.  30 tablet  11  . aspirin EC 325 MG EC tablet Take 1 tablet (325 mg total) by mouth daily.  30 tablet  0  . diazepam (VALIUM) 5 MG tablet Take 1-2 tablets (5-10 mg total) by mouth every 6 (six) hours as needed for muscle spasms.  60 tablet  0  . FLUoxetine (PROZAC) 10 MG capsule Take 1 capsule (10 mg total) by mouth daily.  90 capsule  4  . Fluticasone-Salmeterol (ADVAIR) 500-50 MCG/DOSE AEPB Inhale 1 puff into the lungs every 12 (twelve) hours.  60 each  4  . gabapentin (NEURONTIN) 400 MG capsule Take 2 capsules (800 mg total) by mouth 3 (three) times daily.  180 capsule  4  . lisinopril-hydrochlorothiazide  (PRINZIDE) 20-12.5 MG per tablet Take 1 tablet by mouth daily.  30 tablet  6  . meloxicam (MOBIC) 15 MG tablet TAKE ONE TABLET BY MOUTH ONCE DAILY  30 tablet  2  . MOVIPREP 100 G SOLR Take 1 kit (200 g total) by mouth once.  1 kit  0  . nitroGLYCERIN (NITROSTAT) 0.4 MG SL tablet Place 1 tablet (0.4 mg total) under the tongue every 5 (five) minutes as needed for chest pain.  25 tablet  8  . omega-3 acid ethyl esters (LOVAZA) 1 G capsule Take 2 capsules (2 g total) by mouth 2 (two) times daily.  90 capsule  3  . oxyCODONE-acetaminophen (ROXICET) 5-325 MG per tablet Take 1-2 tablets by mouth every 6 (six) hours as needed.  180 tablet  0  . pantoprazole (PROTONIX) 40 MG tablet Take 1 tablet (40 mg total) by mouth daily.  30 tablet  6  . pravastatin (PRAVACHOL) 20 MG tablet Take 1 tablet (20 mg total) by mouth daily.  30 tablet  11   No current facility-administered medications on file prior to visit.    Allergies:  No Known Allergies  Physical Exam General: well developed, well nourished obese caucasian male, seated, in no evident distress Head: head normocephalic and atraumatic. Orohparynx benign Neck: supple with no carotid or supraclavicular bruits Cardiovascular: regular rate and rhythm, no murmurs Musculoskeletal: no deformity Skin:  no rash/petichiae. Left palm nodular lesion keloid versus lipoma Vascular:  Normal pulses all extremities Filed Vitals:   05/03/14 1447  BP: 137/83  Pulse: 66   Neurologic Exam Mental Status: Awake and fully alert. Oriented to place and time. Recent and remote memory intact. Attention span, concentration and fund of knowledge appropriate. Mood and affect appropriate.  Cranial Nerves: Fundoscopic exam reveals sharp disc margins. Pupils equal, briskly reactive to light. Extraocular movements full without nystagmus. Visual fields full to confrontation. Hearing intact. Facial sensation intact. Face, tongue, palate moves normally and symmetrically.  Motor:  Normal bulk and tone. Normal strength in all tested extremity muscles. Sensory.: intact to touch and pinprick and vibratory sensation.  Coordination: Rapid alternating movements normal in all extremities. Finger-to-nose and heel-to-shin performed accurately bilaterally. Gait and Station: Arises from chair without difficulty. Stance is normal. Gait demonstrates normal stride length and balance . Able to heel, toe and tandem walk without difficulty.  Reflexes: 1+ and symmetric. Toes downgoing.   NIHSS  0 Modified Rankin  1   ASSESSMENT: 21 year Caucasian male with embolic right hemispheric infarcts without identified source in January 2015 2 days  after back surgery. Vascular risk factor of hyperlipidemia and mild obesity only    PLAN: I had a long discussion with  the patient regarding his recent stroke, risk factors, secondary stroke prevention strategies and answered questions. Recommend continue aspirin for stroke prevention and maintain strict control of lipids with LDL cholesterol goal below 120 mg percent. I also advised him to diet, exercise regularly and lose weight. Return for followup in 3 months with Jeani Hawking Lam,NP or call earlier if necessary.Consider possible participation in the RESPECT ESUS stroke prevention trial if interested. We will check TCD bubble study today        [name] [place]. [place] [address]. [phone]       TRANSCRANIAL DOPPLER BUBBLE STUDY   Mr. GOVANNI PLEMONS Date of Birth:  Jan 27, 1956 Medical Record Number:  [id number]   Indications: Diagnostic  Date of Study : 05/03/2014  Clinical History:  57 year patient with strokes Technical Description:   Transcranial Doppler Bubble Study was performed at the bedside after taking written informed consent from the patient and explaining risk/benefits. Both middle cerebral arteries could not be insonated using a headset due to technical diffciulties hence procedure was done using  Right Carotid using orbital window. And IV line was inserted in the left forearm by the RN using aseptic precautions. Agitated saline injection at rest and after valsalva maneuver did/didnot result in high intensity transient signals (HITS).   Impression:  Negative Transcranial Doppler Bubble Study  not indicative of right to left intracardiac shunt.   Results were explained to the patient. Questions were answered.     Note: This document was prepared with digital dictation and possible smart phrase technology. Any transcriptional errors that result from this process are unintentional

## 2014-05-03 NOTE — Patient Instructions (Addendum)
I had a long discussion with the patient regarding his recent stroke, risk factors, secondary stroke prevention strategies and answered questions. Recommend continue aspirin for stroke prevention and maintain strict control of lipids with LDL cholesterol goal below 120 mg percent. I also advised him to diet, exercise regularly and lose weight. Consider possible participation in the RESPECT ESUS stroke prevention trial if interested. Return for followup in 3 months with Jeani Hawking Lam,NP or call earlier if necessary

## 2014-05-07 ENCOUNTER — Other Ambulatory Visit: Payer: Self-pay | Admitting: Neurosurgery

## 2014-05-07 DIAGNOSIS — S32009A Unspecified fracture of unspecified lumbar vertebra, initial encounter for closed fracture: Secondary | ICD-10-CM

## 2014-05-10 ENCOUNTER — Ambulatory Visit
Admission: RE | Admit: 2014-05-10 | Discharge: 2014-05-10 | Disposition: A | Discharge status: Home / Self Care | Payer: Medicaid Other | Source: Ambulatory Visit | Attending: Neurosurgery | Admitting: Neurosurgery

## 2014-05-10 DIAGNOSIS — S32009A Unspecified fracture of unspecified lumbar vertebra, initial encounter for closed fracture: Secondary | ICD-10-CM

## 2014-05-28 ENCOUNTER — Encounter: Payer: Self-pay | Admitting: Internal Medicine

## 2014-05-28 ENCOUNTER — Ambulatory Visit (AMBULATORY_SURGERY_CENTER): Payer: Medicaid Other | Admitting: Internal Medicine

## 2014-05-28 VITALS — BP 142/73 | HR 60 | Temp 97.7°F | Resp 20 | Ht 71.0 in | Wt 217.0 lb

## 2014-05-28 DIAGNOSIS — D126 Benign neoplasm of colon, unspecified: Secondary | ICD-10-CM

## 2014-05-28 DIAGNOSIS — Z1211 Encounter for screening for malignant neoplasm of colon: Secondary | ICD-10-CM

## 2014-05-28 DIAGNOSIS — R195 Other fecal abnormalities: Secondary | ICD-10-CM

## 2014-05-28 MED ORDER — SODIUM CHLORIDE 0.9 % IV SOLN: 500.0000 mL | INTRAVENOUS | Status: DC

## 2014-05-28 NOTE — Op Note (Signed)
[facility]  [address]. [place], [address]   COLONOSCOPY PROCEDURE REPORT  PATIENT: Bradley Sawyer, Bradley Sawyer  MR#: [id number] BIRTHDATE: 1956/09/25 , 57  yrs. old GENDER: Male ENDOSCOPIST: Eustace Quail, MD REFERRED RA:[id number] Karna Christmas, M.D. PROCEDURE DATE:  05/28/2014 PROCEDURE:   Colonoscopy with snare polypectomy x 6 First Screening Colonoscopy - Avg.  risk and is 50 yrs.  old or older Yes.  Prior Negative Screening - Now for repeat screening. N/A  History of Adenoma - Now for follow-up colonoscopy & has been > or = to 3 yrs.  N/A  Polyps Removed Today? Yes. ASA CLASS:   Class II INDICATIONS:average risk screening and heme-positive stool. MEDICATIONS: MAC sedation, administered by CRNA and propofol (Diprivan) 400mg  IV DESCRIPTION OF PROCEDURE:   After the risks benefits and alternatives of the procedure were thoroughly explained, informed consent was obtained.  A digital rectal exam revealed no abnormalities of the rectum.   The LB [id number]  endoscope was introduced through the anus and advanced to the cecum, which was identified by both the appendix and ileocecal valve. No adverse events experienced.   The quality of the prep was adequate, using MoviPrep  The instrument was then slowly withdrawn as the colon was fully examined.  COLON FINDINGS: The colonoscope was advanced to the cecal tip.  5 mm cecal polyp, 5 mm, 6 mm, transverse colon polyp, 4 mm descending colon polyp were all removed with cold snare, retrieved, and submitted.  A 12 mm sigmoid colon polyp and 8 mm rectal polyp were both removed with hot snare.  Also retrieved and submitted. Additionally, moderately severe left-sided diverticulosis.  . Retroflexed views revealed internal hemorrhoids. The time to cecum=4 minutes 27 seconds.  Withdrawal time=18 minutes 14 seconds. The scope was withdrawn and the procedure completed. COMPLICATIONS: There were no complications.  ENDOSCOPIC  IMPRESSION: 1. Multiple colon polyps status post polypectomy 2. Moderately severe left-sided diverticulosis   RECOMMENDATIONS: 1. Repeat Colonoscopy in 3 years.   eSigned:  Eustace Quail, MD 05/28/2014 4:25 PM   cc: The Patient    ; Marrion Coy, MD [facility])   PATIENT NAME:  Bradley Sawyer, Bradley Sawyer MR#: [id number]

## 2014-05-28 NOTE — Patient Instructions (Signed)
YOU HAD AN ENDOSCOPIC PROCEDURE TODAY AT THE [facility]: Refer to the procedure report that was given to you for any specific questions about what was found during the examination.  If the procedure report does not answer your questions, please call your gastroenterologist to clarify.  If you requested that your care partner not be given the details of your procedure findings, then the procedure report has been included in a sealed envelope for you to review at your convenience later.  YOU SHOULD EXPECT: Some feelings of bloating in the abdomen. Passage of more gas than usual.  Walking can help get rid of the air that was put into your GI tract during the procedure and reduce the bloating. If you had a lower endoscopy (such as a colonoscopy or flexible sigmoidoscopy) you may notice spotting of blood in your stool or on the toilet paper. If you underwent a bowel prep for your procedure, then you may not have a normal bowel movement for a few days.  DIET: Your first meal following the procedure should be a light meal and then it is ok to progress to your normal diet.  A half-sandwich or bowl of soup is an example of a good first meal.  Heavy or fried foods are harder to digest and may make you feel nauseous or bloated.  Likewise meals heavy in dairy and vegetables can cause extra gas to form and this can also increase the bloating.  Drink plenty of fluids but you should avoid alcoholic beverages for 24 hours.  ACTIVITY: Your care partner should take you home directly after the procedure.  You should plan to take it easy, moving slowly for the rest of the day.  You can resume normal activity the day after the procedure however you should NOT DRIVE or use heavy machinery for 24 hours (because of the sedation medicines used during the test).    SYMPTOMS TO REPORT IMMEDIATELY: A gastroenterologist can be reached at any hour.  During normal business hours, 8:30 AM to 5:00 PM Monday through Friday,  call [phone].  After hours and on weekends, please call the GI answering service at [phone] who will take a message and have the physician on call contact you.   Following lower endoscopy (colonoscopy or flexible sigmoidoscopy):  Excessive amounts of blood in the stool  Significant tenderness or worsening of abdominal pains  Swelling of the abdomen that is new, acute  Fever of 100F or higher  FOLLOW UP: If any biopsies were taken you will be contacted by phone or by letter within the next 1-3 weeks.  Call your gastroenterologist if you have not heard about the biopsies in 3 weeks.  Our staff will call the home number listed on your records the next business day following your procedure to check on you and address any questions or concerns that you may have at that time regarding the information given to you following your procedure. This is a courtesy call and so if there is no answer at the home number and we have not heard from you through the emergency physician on call, we will assume that you have returned to your regular daily activities without incident.  SIGNATURES/CONFIDENTIALITY: You and/or your care partner have signed paperwork which will be entered into your electronic medical record.  These signatures attest to the fact that that the information above on your After Visit Summary has been reviewed and is understood.  Full responsibility of the confidentiality of this   discharge information lies with you and/or your care-partner.  Recommendations Repeat colonoscopy in 3 years.  

## 2014-05-28 NOTE — Progress Notes (Signed)
Called to room to assist during endoscopic procedure.  Patient ID and intended procedure confirmed with present staff. Received instructions for my participation in the procedure from the performing physician.  

## 2014-05-29 ENCOUNTER — Telehealth: Payer: Self-pay

## 2014-05-29 NOTE — Telephone Encounter (Signed)
  Follow up Call-  Call back number 05/28/2014  Post procedure Call Back phone  # [phone]  Permission to leave phone message Yes     Patient questions:  Do you have a fever, pain , or abdominal swelling? No. Pain Score  0 *  Have you tolerated food without any problems? Yes.    Have you been able to return to your normal activities? Yes.    Do you have any questions about your discharge instructions: Diet   No. Medications  No. Follow up visit  No.  Do you have questions or concerns about your Care? No.  Actions: * If pain score is 4 or above: No action needed, pain <4.

## 2014-06-04 ENCOUNTER — Encounter: Payer: Self-pay | Admitting: Internal Medicine

## 2014-07-02 ENCOUNTER — Ambulatory Visit (INDEPENDENT_AMBULATORY_CARE_PROVIDER_SITE_OTHER): Payer: Medicaid Other | Admitting: Dietician

## 2014-07-02 ENCOUNTER — Encounter: Payer: Medicaid Other | Admitting: Internal Medicine

## 2014-07-02 ENCOUNTER — Telehealth: Payer: Self-pay | Admitting: Internal Medicine

## 2014-07-02 VITALS — Ht 71.0 in | Wt 227.8 lb

## 2014-07-02 DIAGNOSIS — E669 Obesity, unspecified: Secondary | ICD-10-CM

## 2014-07-02 NOTE — Telephone Encounter (Signed)
Left message to patient that he does not have to come in to see Ms. Butch Penny Plyler this afternoon, only his appointment today with me at 3:30PM.

## 2014-07-02 NOTE — Progress Notes (Signed)
Medical Nutrition Therapy:  Appt start time: 1330 end time:  1430.  Assessment:  Primary concerns today: Meal planning and how to cook/eat healthy on his limited budget.  Patient requested easy to make and low budget recipes that would help him lose weight. He currently resides in a camper that is parked, has small stove and refrigerator and freezer space. Limited cooking utensils and recently had his food stamps decreased from 200/month down to 110 /month. Is very stressed about situation but is verbalizing how he can make ends meet.  Learning Readiness: Change in progress- is drinking more diet drink now instead of regular soda, trying to eat more vegetables, bought a juicer.  Barriers to learning/adherence to lifestyle change: limited budget, transportation, knowledge of meal planning and cooking and kitchen space and tools- lives in camper with small freezer, refrigerator, gas stove (has no gas)  and microwave. . Frequent foods include used to eat out most meals and drink regular soft drinks. Now trying to eat healthier to loose weight.   24-hr recall not done today as it was not appropriate to patient's questions and concerns.  BMI is 31.8- he is obese. Recent weight gain of 10# noted.   Usual physical activity includes activities of daily living. He reports he would like to swim. COPD prevents him from doing too much.   Estimated calorie needs:  Progress Towards Goal(s):  In progress.   Nutritional Diagnosis:  NB-1.1 Food and nutrition-related knowledge deficit As related to lack of prior exposure to meal planning.  As evidenced by his questions and report.    Intervention:  Nutrition education on recips, cooking methods for limited soace and budget. . Coordination of care- refferal to social work to assist with referral to [facility] if needed and appropriate. Handouts given during visit include:  AVS  healthy shopping on a budget, food pantry list, recipes for easy to cook  foods using the microwave Demonstrated degree of understanding via:  Teach Back   Monitoring/Evaluation:  Dietary intake, exercise, and body weight prn.

## 2014-07-02 NOTE — Patient Instructions (Signed)
Bradley Sawyer,   It was a pleasure meeting with you today.    Please do not hesitate to contact me or make a follow up visit for further assistance with reaching your goals.   I forgot to print this for you.   Directions on how to boil eggs that are easy to peel:  Place large eggs (older eggs will be easier to peel) in a medium heavy saucepan. Add water to cover by 1 1/2 inches.  Bring to a boil; immediately remove from heat.  Cover, twirl eggs and water by swirling the pan around in circles to loosen the egg membrane.  Let stand for 10 minutes. Transfer eggs to a bowl of ice water to cool.  Gently crack the eggs and peel under running water for ease  Sincerely,  Debera Lat, Dietitian [phone]

## 2014-07-03 ENCOUNTER — Encounter: Payer: Medicaid Other | Admitting: Internal Medicine

## 2014-07-03 ENCOUNTER — Telehealth: Payer: Self-pay | Admitting: Licensed Clinical Social Worker

## 2014-07-03 NOTE — Telephone Encounter (Signed)
Bradley Sawyer was referred to [name] by CDE for referral to [name].  CSW placed called to pt.  CSW left message requesting return call. CSW provided contact hours and phone number.  Pt notified on voice mail no referral required for access to [name].

## 2014-07-05 NOTE — Telephone Encounter (Signed)
CSW placed called to pt.  CSW left message requesting return call. CSW provided contact hours and phone number. 

## 2014-07-10 ENCOUNTER — Encounter: Payer: Self-pay | Admitting: Internal Medicine

## 2014-07-10 ENCOUNTER — Ambulatory Visit: Payer: Medicaid Other | Admitting: Internal Medicine

## 2014-07-10 ENCOUNTER — Ambulatory Visit (INDEPENDENT_AMBULATORY_CARE_PROVIDER_SITE_OTHER): Payer: Medicaid Other | Admitting: Internal Medicine

## 2014-07-10 VITALS — BP 93/69 | HR 66 | Temp 98.2°F | Wt 220.6 lb

## 2014-07-10 DIAGNOSIS — I2510 Atherosclerotic heart disease of native coronary artery without angina pectoris: Secondary | ICD-10-CM | POA: Insufficient documentation

## 2014-07-10 DIAGNOSIS — F17200 Nicotine dependence, unspecified, uncomplicated: Secondary | ICD-10-CM

## 2014-07-10 DIAGNOSIS — I25119 Atherosclerotic heart disease of native coronary artery with unspecified angina pectoris: Secondary | ICD-10-CM

## 2014-07-10 DIAGNOSIS — R6889 Other general symptoms and signs: Secondary | ICD-10-CM

## 2014-07-10 DIAGNOSIS — I209 Angina pectoris, unspecified: Secondary | ICD-10-CM

## 2014-07-10 DIAGNOSIS — I10 Essential (primary) hypertension: Secondary | ICD-10-CM

## 2014-07-10 DIAGNOSIS — S32020K Wedge compression fracture of second lumbar vertebra, subsequent encounter for fracture with nonunion: Secondary | ICD-10-CM

## 2014-07-10 DIAGNOSIS — R0989 Other specified symptoms and signs involving the circulatory and respiratory systems: Secondary | ICD-10-CM

## 2014-07-10 DIAGNOSIS — IMO0002 Reserved for concepts with insufficient information to code with codable children: Secondary | ICD-10-CM

## 2014-07-10 DIAGNOSIS — Z86010 Personal history of colonic polyps: Secondary | ICD-10-CM

## 2014-07-10 LAB — CBC WITH DIFFERENTIAL/PLATELET
Basophils Absolute: 0 10*3/uL (ref 0.0–0.1)
Basophils Relative: 0 % (ref 0–1)
EOS ABS: 0 10*3/uL (ref 0.0–0.7)
EOS PCT: 0 % (ref 0–5)
HCT: 40.2 % (ref 39.0–52.0)
Hemoglobin: 14.2 g/dL (ref 13.0–17.0)
Lymphocytes Relative: 31 % (ref 12–46)
Lymphs Abs: 4.3 10*3/uL — ABNORMAL HIGH (ref 0.7–4.0)
MCH: 31.2 pg (ref 26.0–34.0)
MCHC: 35.3 g/dL (ref 30.0–36.0)
MCV: 88.4 fL (ref 78.0–100.0)
Monocytes Absolute: 1 10*3/uL (ref 0.1–1.0)
Monocytes Relative: 7 % (ref 3–12)
NEUTROS PCT: 62 % (ref 43–77)
Neutro Abs: 8.6 10*3/uL — ABNORMAL HIGH (ref 1.7–7.7)
PLATELETS: 223 10*3/uL (ref 150–400)
RBC: 4.55 MIL/uL (ref 4.22–5.81)
RDW: 13.9 % (ref 11.5–15.5)
WBC: 13.8 10*3/uL — ABNORMAL HIGH (ref 4.0–10.5)

## 2014-07-10 MED ORDER — GABAPENTIN 400 MG PO CAPS: 800.0000 mg | ORAL_CAPSULE | Freq: Three times a day (TID) | ORAL | Status: DC

## 2014-07-10 MED ORDER — MELOXICAM 15 MG PO TABS: 15.0000 mg | ORAL_TABLET | Freq: Every day | ORAL | Status: DC

## 2014-07-10 NOTE — Progress Notes (Signed)
   Subjective:    Patient ID: Bradley Sawyer, male    DOB: 14-Jul-1956, 58 y.o.   MRN: [id number]  [name] is a 58yo man w/ PMHx of HTN, COPD, CVA in 2015, HLD, tobacco abuse, compression fracture of L2 with back and right groin pain s/p lumbar fusion in January 2015 who presents today for the following:  1. HTN/Unequal Pressures in Upper Extremities: At last visit on 04/10/14, patient noted to have unequal BPs in upper extremities with right BP ~30 mmHg less than left arm. Bilateral arterial U/S of upper extremities was ordered but patient never received imaging. Today his right arm BP is 93/69 and left arm is 142/84. Patient denies any color changes in his right arm/hand or weakness. He is currently taking Amlodipine 10 mg daily and Lisinopril-HCTZ 20-12.5 mg daily. He denies dizziness, headaches.  2. Positive Occult Blood Test: Patient had positive fecal occult blood test at last visit on 04/10/14. Pt received colonoscopy on 05/28/14 and was found to have 4 colon polyps (tubular adenomas) s/p polypectomy as well as moderate-severe left-sided diverticulosis. Pt recommended to have next colonoscopy in 3 years (05/2017). He denies melena, hematochezia.   3. Compression Fracture s/p Lumbar Fusion: Pt had lumbar fusion surgery in January 2015. He reports his back pain and groin pain have improved after receiving steroid injections last week at his neurosurgeon's office. He is scheduled to receive additional steroid injections on September 8th. He takes Gabapentin 800 mg TID and Meloxicam 15 mg daily for his pain. He is currently being seen at a pain clinic "across the street" where he receives Roxicet 5-325 mg Q6H as needed.   4. CAD: Pt was seen in ED in 2013 for chest pain. He received a coronary CT which showed non-obstructive coronary disease, 25-50% stenosis of LAD. He was seen by cardiology in 2013 and was recommended to follow up annually which he has not done. Currently, he is experiencing  chest pain about 1-2 x per month. His chest pain lasts 5-10 minutes, is relieved by Nitroglycerin, and is not precipitated by exertion. He denies palpitations, orthopnea, and SOB more than normal from his COPD.   5. Tobacco abuse: Patient currently smokes 3/4- 1 ppd. He previously smoked 2 ppd for many years. He states he is trying to quit and has quit several times in the past for 3 months but his back pain causes him to go back to smoking. He is currently on Chantix and feels that it is helping him cut down on his smoking.    Review of Systems General: Denies fever, chills, night sweats, changes in weight, changes in appetite HEENT: Denies ear pain, changes in vision, rhinorrhea, sore throat CV: See HPI Pulm: Denies cough, wheezing GI: Denies abdominal pain, nausea, vomiting, diarrhea, constipation GU: Denies dysuria, hematuria, frequency Msk: Denies muscle cramps, joint pains Neuro: Denies weakness, numbness, tingling Skin: Denies rashes, bruising    Objective:   Physical Exam General: appears stated age, sitting up in chair, NAD HEENT: [place]/AT, EOMI, PERRL, sclera anicteric, pharynx non-erythematous, mucus membranes moist Neck: supple, no JVD, no lymphadenopathy CV: RRR, normal S1/S2, no m/g/r Pulm: CTA bilaterally, breaths non-labored, no wheezing Abd: BS+, soft, non-distended, non-tender Ext: warm, no edema, moves all Neuro: alert and oriented x 3, CNs II-XII intact, strength 5/5 in upper and lower extremities bilaterally     Assessment & Plan:

## 2014-07-10 NOTE — Assessment & Plan Note (Signed)
Reviewed results of colonoscopy with patient. His next colonoscopy is scheduled for 3 years from now (05/2017).

## 2014-07-10 NOTE — Assessment & Plan Note (Signed)
BP in right arm 93/69, left arm 142/84 today. - Bilateral arterial U/S of upper extremities ordered - Continue current BP regimen

## 2014-07-10 NOTE — Assessment & Plan Note (Signed)
  Assessment: Progress toward smoking cessation:  smoking less Barriers to progress toward smoking cessation:  lack of motivation to quit Comments:   Plan: Instruction/counseling given:  I counseled patient on the dangers of tobacco use, advised patient to stop smoking, and reviewed strategies to maximize success. Educational resources provided:    Self management tools provided:    Medications to assist with smoking cessation:  Varenicline (Chantix) Patient agreed to the following self-care plans for smoking cessation: cut down the number of cigarettes smoked;set a quit date and stop smoking  Other plans: I proposed to pt to cut down to 1/2 ppd. Pt agrees to this goal and hopes to actually quit by the time I see him at his next appointment.

## 2014-07-10 NOTE — Assessment & Plan Note (Signed)
-   Continue Meloxicam 15 mg daily - Continue Gabapentin 800 mg TID - Continue Roxicet 5-325 mg Q6H as needed- pt following in pain clinic

## 2014-07-10 NOTE — Assessment & Plan Note (Addendum)
Pt has hx of CAD on coronary CT scan from 2013. Pt has not followed up with cardiology annually as recommended. Pt is having CP 1-2x per month which is relieved by Nitroglycerin. - Referral to cardiology - Continue to monitor chest pain - Continue risk factor modification- aspirin, pravastatin, BP control

## 2014-07-10 NOTE — Patient Instructions (Addendum)
It was a pleasure taking care of you today, Bradley Sawyer.  Here is what we discussed today:  1. High Blood Pressure/ Unequal pressures in arms - Get ultrasound of upper arms today - Continue Amlodipine, Lisinopril-HCTZ medications  2. Colonoscopy - Colon polyps removed - Next colonoscopy in 3 years  3. Compression fracture - Continue Gabapentin 800 mg three times a day - Continue Percocet as needed - Continue Meloxicam 15 mg daily - Refills given today  4. Occasional chest pain - Referral to cardiology  General Instructions:   Please try to bring all your medicines next time. This will help [place] keep you safe from mistakes.   Progress Toward Treatment Goals:  Treatment Goal 04/09/2014  Blood pressure deteriorated  Stop smoking smoking the same amount    Self Care Goals & Plans:  Self Care Goal 04/09/2014  Manage my medications take my medicines as prescribed; bring my medications to every visit  Eat healthy foods -  Be physically active -  Stop smoking -    No flowsheet data found.   Care Management & Community Referrals:  Referral 09/21/2013  Referrals made for care management support none needed

## 2014-07-10 NOTE — Assessment & Plan Note (Signed)
BP Readings from Last 3 Encounters:  07/10/14 93/69  05/28/14 142/73  05/03/14 137/83    Lab Results  Component Value Date   NA 143 12/14/2013   K 3.9 12/14/2013   CREATININE 0.86 12/14/2013    Assessment: Blood pressure control: mildly elevated Progress toward BP goal:  unchanged Comments: BP in upper extremities unequal. Left arm today 142/84, right arm 93/69.  Plan: Medications:  continue current medications Educational resources provided:   Self management tools provided:   Other plans: Will get bilateral arterial U/S of upper extremities to assess unequal pressures. Concern for subclavian stenosis.

## 2014-07-11 ENCOUNTER — Telehealth: Payer: Self-pay | Admitting: *Deleted

## 2014-07-11 NOTE — Telephone Encounter (Signed)
Pt has tried gemfibrozil in the past and has had triglycerides >500.  Medication approved until 07/10/2015.Despina Hidden Cassady8/20/20159:19 AM

## 2014-07-12 NOTE — Progress Notes (Signed)
Internal Medicine Clinic Attending Date of visit: 07/10/2014  I saw and evaluated the patient.  I personally confirmed the key portions of the history and exam documented by Dr. Arcelia Jew and I reviewed pertinent patient test results.  The assessment, diagnosis, and plan were formulated together and I agree with the documentation in the resident's note.

## 2014-07-18 NOTE — Telephone Encounter (Signed)
No response back from Mr. Mackowski.  CSW will sign off.

## 2014-07-23 ENCOUNTER — Telehealth: Payer: Self-pay | Admitting: Internal Medicine

## 2014-07-23 ENCOUNTER — Ambulatory Visit (HOSPITAL_COMMUNITY)
Admission: RE | Admit: 2014-07-23 | Discharge: 2014-07-23 | Disposition: A | Discharge status: Home / Self Care | Payer: Medicaid Other | Source: Ambulatory Visit | Attending: Internal Medicine | Admitting: Internal Medicine

## 2014-07-23 DIAGNOSIS — R6889 Other general symptoms and signs: Secondary | ICD-10-CM

## 2014-07-23 DIAGNOSIS — R0989 Other specified symptoms and signs involving the circulatory and respiratory systems: Secondary | ICD-10-CM

## 2014-07-23 NOTE — Telephone Encounter (Signed)
Dr. 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 Dr. Oneida Alar will try to get patient in for an appointment this week to be evaluated.

## 2014-07-23 NOTE — Telephone Encounter (Signed)
Tried to contact pt about doppler upper extremities results. Will try again later.

## 2014-07-23 NOTE — Progress Notes (Signed)
*  PRELIMINARY RESULTS* Vascular Ultrasound Upper Extremity Arterial Doppler has been completed.  Right Upper Extremity Arterial Duplex has been completed.    There is a significant difference in brachial artery pressures. 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  07/23/2014 12:56 PM Maudry Mayhew, RVT, RDCS, RDMS

## 2014-07-24 ENCOUNTER — Other Ambulatory Visit: Payer: Self-pay | Admitting: *Deleted

## 2014-07-24 ENCOUNTER — Encounter: Payer: Self-pay | Admitting: Vascular Surgery

## 2014-07-24 DIAGNOSIS — I771 Stricture of artery: Secondary | ICD-10-CM

## 2014-07-25 ENCOUNTER — Ambulatory Visit (HOSPITAL_COMMUNITY)
Admission: RE | Admit: 2014-07-25 | Discharge: 2014-07-25 | Disposition: A | Discharge status: Home / Self Care | Payer: Medicaid Other | Source: Ambulatory Visit | Attending: Vascular Surgery | Admitting: Vascular Surgery

## 2014-07-25 ENCOUNTER — Ambulatory Visit (INDEPENDENT_AMBULATORY_CARE_PROVIDER_SITE_OTHER): Payer: Medicaid Other | Admitting: Vascular Surgery

## 2014-07-25 ENCOUNTER — Telehealth: Payer: Self-pay | Admitting: Cardiovascular Disease

## 2014-07-25 ENCOUNTER — Encounter: Payer: Self-pay | Admitting: Vascular Surgery

## 2014-07-25 VITALS — BP 129/72 | HR 60 | Temp 98.3°F | Resp 16 | Ht 71.0 in | Wt 217.5 lb

## 2014-07-25 DIAGNOSIS — I771 Stricture of artery: Secondary | ICD-10-CM

## 2014-07-25 DIAGNOSIS — G458 Other transient cerebral ischemic attacks and related syndromes: Secondary | ICD-10-CM

## 2014-07-25 DIAGNOSIS — I6529 Occlusion and stenosis of unspecified carotid artery: Secondary | ICD-10-CM

## 2014-07-25 MED ORDER — CLOPIDOGREL BISULFATE 75 MG PO TABS: 75.0000 mg | ORAL_TABLET | Freq: Every day | ORAL | Status: DC

## 2014-07-25 NOTE — Progress Notes (Signed)
[facility] [place] HISTORY AND PHYSICAL   History of Present Illness:  Patient is a 58 y.o. year old male who presents for evaluation of right subclavian artery stenosis. This was found incidentally on measuring the blood pressure in his right arm. The patient denies any exertional fatigue in the right arm. He does have occasional numbness in both hands. However the right is not worse than left. He states he did have a stroke in the past which Inderal his speech memory and right hand. This was after a back operation in February of 2015. He fully recovered. He denies any recent symptoms of TIA or stroke. He denies any symptoms of dizziness. However, he has had several episodes recently where he had a lower field cut in his right eye. This would resolve and a few minutes. He is on aspirin twice daily. He currently is a smoker but is trying to quit. He is right handed. He states that they have not been able to get a blood pressure in his right arm for several months.  Other medical problems include hyperlipidemia, hypertension, tobacco abuse, COPD, chronic back pain all of which are currently stable. He has had previous cervical and lumbar fusion.  Past Medical History  Diagnosis Date  . Hyperlipidemia   . Hypertension   . GERD (gastroesophageal reflux disease)   . Tobacco abuse   . COPD (chronic obstructive pulmonary disease)     bronchitis  . Disorder of vocal cord   . Cocaine dependence     Last use 1998, attends 3 NA meetings weekly.  . Right shoulder pain     2/2 partial rotator cuff tear, tendinopathy, mild subacromial/subdeltioid bursitis, A/C joint arthropathy per MRI done in 3/07  . Arthritis   . Anxiety   . Compression fracture     L2  . CVA (cerebral infarction)   . Fatty liver     Past Surgical History  Procedure Laterality Date  . Neck surgery    . Lumbar spine surgery  12/18/2013    L2     DR POOL  . Anterior lat lumbar fusion N/A 12/18/2013   Procedure: Lumbar Two Anteriorlateral Corpectomy w/ cage, interbody fusion, anterior plating, Lumbar One to Lumbar Three percutaneous pedicle screws;  Surgeon: Charlie Pitter, MD;  Location: [facility];  Service: Neurosurgery;  Laterality: N/A;  . Tee without cardioversion N/A 12/25/2013    Procedure: TRANSESOPHAGEAL ECHOCARDIOGRAM (TEE);  Surgeon: Larey Dresser, MD;  Location: [facility];  Service: Cardiovascular;  Laterality: N/A;    Social History History  Substance Use Topics  . Smoking status: Current Every Day Smoker -- 1.00 packs/day for 48 years    Types: Cigarettes  . Smokeless tobacco: Never Used     Comment: Started back x 2 weeks.  . Alcohol Use: No    Family History Family History  Problem Relation Age of Onset  . Diabetes Paternal Grandmother     1 st degree relatives  . Heart disease Father   . Diabetes Brother   . Heart disease Brother   . Muscular dystrophy Brother   . Muscular dystrophy Sister     Allergies  No Known Allergies   Current Outpatient Prescriptions  Medication Sig Dispense Refill  . albuterol (PROVENTIL HFA;VENTOLIN HFA) 108 (90 BASE) MCG/ACT inhaler Inhale 2 puffs into the lungs every 6 (six) hours as needed for wheezing.  3 Inhaler  4  . amLODipine (NORVASC) 10 MG tablet Take 1 tablet (10 mg  total) by mouth daily.  30 tablet  11  . aspirin EC 325 MG EC tablet Take 1 tablet (325 mg total) by mouth daily.  30 tablet  0  . clopidogrel (PLAVIX) 75 MG tablet Take 1 tablet (75 mg total) by mouth daily.  30 tablet  6  . diazepam (VALIUM) 5 MG tablet Take 1-2 tablets (5-10 mg total) by mouth every 6 (six) hours as needed for muscle spasms.  60 tablet  0  . FLUoxetine (PROZAC) 10 MG capsule Take 1 capsule (10 mg total) by mouth daily.  90 capsule  4  . Fluticasone-Salmeterol (ADVAIR) 500-50 MCG/DOSE AEPB Inhale 1 puff into the lungs every 12 (twelve) hours.  60 each  4  . gabapentin (NEURONTIN) 400 MG capsule Take 2 capsules (800 mg total) by mouth 3  (three) times daily.  180 capsule  4  . lisinopril-hydrochlorothiazide (PRINZIDE) 20-12.5 MG per tablet Take 1 tablet by mouth daily.  30 tablet  6  . meloxicam (MOBIC) 15 MG tablet Take 1 tablet (15 mg total) by mouth daily.  30 tablet  2  . nitroGLYCERIN (NITROSTAT) 0.4 MG SL tablet Place 1 tablet (0.4 mg total) under the tongue every 5 (five) minutes as needed for chest pain.  25 tablet  8  . omega-3 acid ethyl esters (LOVAZA) 1 G capsule Take 2 capsules (2 g total) by mouth 2 (two) times daily.  90 capsule  3  . oxyCODONE-acetaminophen (ROXICET) 5-325 MG per tablet Take 1-2 tablets by mouth every 6 (six) hours as needed.  180 tablet  0  . pantoprazole (PROTONIX) 40 MG tablet Take 1 tablet (40 mg total) by mouth daily.  30 tablet  6  . pravastatin (PRAVACHOL) 20 MG tablet Take 1 tablet (20 mg total) by mouth daily.  30 tablet  11   No current facility-administered medications for this visit.    ROS:   General:  No weight loss, Fever, chills  HEENT: No recent headaches, no nasal bleeding, no visual changes, no sore throat  Neurologic: No dizziness, blackouts, seizures. No recent symptoms of stroke or mini- stroke. No recent episodes of slurred speech, or temporary blindness.  Cardiac: No recent episodes of chest pain/pressure, no shortness of breath at rest.  + shortness of breath with exertion.  Denies history of atrial fibrillation or irregular heartbeat  Vascular: No history of rest pain in feet.  No history of claudication.  No history of non-healing ulcer, No history of DVT   Pulmonary: No home oxygen, no productive cough, no hemoptysis,  + asthma or wheezing  Musculoskeletal:  [ x] Arthritis, [x ] Low back pain,  [x ] Joint pain  Hematologic:No history of hypercoagulable state.  No history of easy bleeding.  No history of anemia  Gastrointestinal: No hematochezia or melena,  No gastroesophageal reflux, no trouble swallowing  Urinary: [ ]  chronic Kidney disease, [ ]  on HD - [ ]   MWF or [ ]  TTHS, [ ]  Burning with urination, [ ]  Frequent urination, [ ]  Difficulty urinating;   Skin: No rashes  Psychological: + history of anxiety,  No history of depression   Physical Examination  Filed Vitals:   07/25/14 0936 07/25/14 0939  BP: 109/59 129/72  Pulse: 94 60  Temp: 98.3 F (36.8 C)   TempSrc: Oral   Resp: 16   Height: 5\' 11"  (1.803 m)   Weight: 217 lb 8 oz (98.657 kg)   SpO2: 98%     Body mass index is  30.35 kg/(m^2).  General:  Alert and oriented, no acute distress HEENT: Normal Neck: 1-2+ right carotid pulse 2+ left carotid pulse bilateral carotid bruits, well-healed right transverse neck scar from previous fusion  Pulmonary: Coarse breath sounds at bases bilaterally Cardiac: Regular Rate and Rhythm without murmur Abdomen: Soft, non-tender, non-distended, no mass Skin: No rash Extremity Pulses:  2+ radial, brachial, femoral, dorsalis pedis, posterior tibial pulses bilaterally Musculoskeletal: No deformity or edema  Neurologic: Upper and lower extremity motor 5/5 and symmetric  DATA:  Carotid duplex scan dated 07/25/2014 done in our office today. I reviewed and interpreted this study. This shows bilateral less than 40% internal carotid artery stenosis. Right brachial pressure 80 left brachial pressure 138 right carotid system velocities decreased suggestive of right common carotid stenosis.   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 His right eye symptoms may be related to this. If this is a high-grade stenosis he may be at risk of stroke. His right subclavian his most likely affected by this innominate artery stenosis. However, this is asymptomatic.   PLAN:  Arch and carotid angio possible innominate or CCA stent on  9/25.  Start Plavix in combo with ASA today.  Risks benefits possible complications of angio and intervention including but  not limited possible stroke risk to  bleeding infection contrast reaction were explained to the patient today. We will plan on a 1 night hospital stay if we do an intervention. However if it is a diagnostic Angio only he will be a little on same day after his bed rest.   Ruta Hinds, MD [facility] [place]: [phone] Pager: [phone]

## 2014-07-30 ENCOUNTER — Ambulatory Visit: Payer: Medicaid Other | Admitting: Cardiology

## 2014-07-30 ENCOUNTER — Other Ambulatory Visit: Payer: Self-pay | Admitting: *Deleted

## 2014-07-30 NOTE — Addendum Note (Signed)
Addended by: Hulan Fray on: 07/30/2014 06:58 PM   Modules accepted: Orders

## 2014-08-06 ENCOUNTER — Other Ambulatory Visit: Payer: Self-pay | Admitting: *Deleted

## 2014-08-07 MED ORDER — FLUTICASONE-SALMETEROL 500-50 MCG/DOSE IN AEPB: 1.0000 | INHALATION_SPRAY | Freq: Two times a day (BID) | RESPIRATORY_TRACT | Status: DC

## 2014-08-12 ENCOUNTER — Encounter: Payer: Self-pay | Admitting: Internal Medicine

## 2014-08-12 ENCOUNTER — Ambulatory Visit (INDEPENDENT_AMBULATORY_CARE_PROVIDER_SITE_OTHER): Payer: Medicaid Other | Admitting: Internal Medicine

## 2014-08-12 VITALS — BP 86/55 | HR 72 | Temp 98.3°F | Ht 71.0 in | Wt 220.7 lb

## 2014-08-12 DIAGNOSIS — S6010XA Contusion of unspecified finger with damage to nail, initial encounter: Secondary | ICD-10-CM | POA: Insufficient documentation

## 2014-08-12 DIAGNOSIS — E785 Hyperlipidemia, unspecified: Secondary | ICD-10-CM

## 2014-08-12 DIAGNOSIS — F17200 Nicotine dependence, unspecified, uncomplicated: Secondary | ICD-10-CM

## 2014-08-12 DIAGNOSIS — I10 Essential (primary) hypertension: Secondary | ICD-10-CM

## 2014-08-12 DIAGNOSIS — Z0000 Encounter for general adult medical examination without abnormal findings: Secondary | ICD-10-CM | POA: Insufficient documentation

## 2014-08-12 DIAGNOSIS — M25511 Pain in right shoulder: Secondary | ICD-10-CM

## 2014-08-12 DIAGNOSIS — M25519 Pain in unspecified shoulder: Secondary | ICD-10-CM

## 2014-08-12 DIAGNOSIS — S6000XA Contusion of unspecified finger without damage to nail, initial encounter: Secondary | ICD-10-CM

## 2014-08-12 DIAGNOSIS — Z23 Encounter for immunization: Secondary | ICD-10-CM

## 2014-08-12 DIAGNOSIS — I6529 Occlusion and stenosis of unspecified carotid artery: Secondary | ICD-10-CM

## 2014-08-12 MED ORDER — VARENICLINE TARTRATE 0.5 MG PO TABS: ORAL_TABLET | ORAL | Status: DC

## 2014-08-12 MED ORDER — OMEGA-3-ACID ETHYL ESTERS 1 G PO CAPS: 2.0000 g | ORAL_CAPSULE | Freq: Two times a day (BID) | ORAL | Status: DC

## 2014-08-12 NOTE — Assessment & Plan Note (Signed)
Patient advised to take Tylenol 500mg  1-2 tablet q8 hr for pain or to use OTC rubs such as Bang aid.

## 2014-08-12 NOTE — Assessment & Plan Note (Addendum)
  Assessment: Progress toward smoking cessation:   had quit but restarted. Has an 52 pack-year smoking hx (since age 58, 1 to 2 pack/day) Barriers to progress toward smoking cessation:   Withdrawal symtpoms Comments: He is ready to quit, wants to restart Chantix.   Plan: Instruction/counseling given:  I counseled patient on the dangers of tobacco use, advised patient to stop smoking, and reviewed strategies to maximize success. Educational resources provided:    Self management tools provided:    Medications to assist with smoking cessation:  Varenicline (Chantix) Patient agreed to the following self-care plans for smoking cessation:    Other plans: Rx Chantix. Continue providing smoking cessation counseling.

## 2014-08-12 NOTE — Assessment & Plan Note (Signed)
Advised pt to keep band-aid over nail for now to prevent further damage and wait until new nail grows to let the "old" nail fall off naturally.

## 2014-08-12 NOTE — Patient Instructions (Signed)
-  Start taking Plavix daily and only one aspirin per day.  -Start taking Chantix to quit smoking.  -Follow up in 1 month.   Please bring your medicines with you each time you come.   Medicines may be  Eye drops  Herbal   Vitamins  Pills  Seeing these help [place] take care of you.

## 2014-08-12 NOTE — Assessment & Plan Note (Addendum)
BP Readings from Last 3 Encounters:  07/25/14 129/72  07/10/14 93/69  05/28/14 142/73    Lab Results  Component Value Date   NA 143 12/14/2013   K 3.9 12/14/2013   CREATININE 0.86 12/14/2013    Assessment: Blood pressure control:  Controlled Progress toward BP goal:   At goal Comments: He has blood pressure differential with 154/75 on the left arm and 86/55 on the right arm. He has known right subclavian artery stenosis with angio scheduled for 10/02. He is on amlodipine 10mg  daily, lisinopril-HCTZ 20-12.5mg  daily.   Plan: Medications:  continue current medications Educational resources provided:   Self management tools provided:   Other plans: Continue monitoring. Follow up in 1 month, after his procedure.

## 2014-08-12 NOTE — Assessment & Plan Note (Addendum)
Is being followed by Dr. Oneida Alar in Vascular Surgery with arch and right subclavian angio scheduled for 10/02. He has no arm pain/acheness/heaviness.  Pt advised to start taking plavix daily and take only one ASA per day.

## 2014-08-12 NOTE — Progress Notes (Signed)
   Subjective:    Patient ID: Bradley Sawyer, male    DOB: 1956-02-15, 58 y.o.   MRN: [id number]  HPI Mr. Biggins is a 58 year old man with PMH of CVA, tobacco use, HLP, HTN, CAD, s/p anterior lumbar spine fusion, and recently diagnosed right subclavian artery stenosis who presents for follow up visit for his high blood pressure. He continues to have differential BP in both arms with 154/75 on the left and 86/55 on the right. He has seen Dr. Oneida Alar in Vascular Surgery and will have arch and carotic angio on 10/02. He was prescribed Plavix and ASA per Dr. Oneida Alar but he is not sure he is taking Plavix, he continues to take two ASA of 325mg  per day. He had quit smoking for 2-3 days but restarted. He is interested in trying Chantix again.  He his right middle finger and damage the nail but notes that a new nail is growing underneath, denies pain with the nail.  He has started going to the [facility], walks 1 mile per day, uses the pool, and the gym, wants something for right shoulder arthritis.    Review of Systems  Constitutional: Negative for fever, chills, diaphoresis, activity change, appetite change, fatigue and unexpected weight change.  HENT: Negative for congestion, facial swelling and nosebleeds.   Respiratory: Negative for cough, shortness of breath and wheezing.   Cardiovascular: Negative for palpitations and leg swelling.  Gastrointestinal: Negative for abdominal pain and blood in stool.  Genitourinary: Negative for dysuria and difficulty urinating.  Musculoskeletal: Positive for arthralgias. Negative for myalgias.  Skin: Negative for color change.       Damaged nail of right middle finger  Neurological: Negative for dizziness, weakness and light-headedness.  Psychiatric/Behavioral: Negative for agitation.       Objective:   Physical Exam  Nursing note and vitals reviewed. Constitutional: He is oriented to person, place, and time. He appears well-developed and well-nourished. No  distress.  Obese  Cardiovascular: Normal rate and regular rhythm.   Pulmonary/Chest: Effort normal. No respiratory distress. He has no wheezes. He has no rales.  Musculoskeletal: He exhibits no edema and no tenderness.  Neurological: He is alert and oriented to person, place, and time.  Skin: Skin is warm and dry. He is not diaphoretic.  Right middle finger nail with damage at the nail bed with no s/s of infection. New nail growing underneath.   Psychiatric: He has a normal mood and affect.          Assessment & Plan:

## 2014-08-12 NOTE — Assessment & Plan Note (Signed)
Refilled his Omega 3 fatty acid.

## 2014-08-12 NOTE — Assessment & Plan Note (Signed)
He received his flu vaccine during this visit.

## 2014-08-13 ENCOUNTER — Other Ambulatory Visit: Payer: Self-pay | Admitting: *Deleted

## 2014-08-13 DIAGNOSIS — J449 Chronic obstructive pulmonary disease, unspecified: Secondary | ICD-10-CM

## 2014-08-13 MED ORDER — ALBUTEROL SULFATE HFA 108 (90 BASE) MCG/ACT IN AERS: 2.0000 | INHALATION_SPRAY | Freq: Four times a day (QID) | RESPIRATORY_TRACT | Status: DC | PRN

## 2014-08-13 NOTE — Progress Notes (Signed)
INTERNAL MEDICINE TEACHING ATTENDING ADDENDUM - Yavuz Kirby, MD: I reviewed and discussed at the time of visit with the resident Dr. Kennerly, the patient's medical history, physical examination, diagnosis and results of pertinent tests and treatment and I agree with the patient's care as documented.  

## 2014-08-13 NOTE — Telephone Encounter (Signed)
Pt also request refill on Plavix but according to the chart it was reflled by Dr Oneida Alar on 07/25/14; pt was called, had to leave a message.

## 2014-08-19 ENCOUNTER — Encounter (HOSPITAL_COMMUNITY): Payer: Self-pay | Admitting: Pharmacy Technician

## 2014-08-22 MED ORDER — SODIUM CHLORIDE 0.9 % IV SOLN
INTRAVENOUS | Status: DC
  Administered 2014-08-23: 1000 mL via INTRAVENOUS

## 2014-08-23 ENCOUNTER — Other Ambulatory Visit: Payer: Self-pay | Admitting: *Deleted

## 2014-08-23 ENCOUNTER — Encounter (HOSPITAL_COMMUNITY)
Admission: RE | Disposition: A | Discharge status: Home / Self Care | Payer: Self-pay | Source: Ambulatory Visit | Attending: Vascular Surgery

## 2014-08-23 ENCOUNTER — Observation Stay (HOSPITAL_COMMUNITY)
Admission: RE | Admit: 2014-08-23 | Discharge: 2014-08-24 | Disposition: A | Discharge status: Home / Self Care | Payer: Medicaid Other | Source: Ambulatory Visit | Attending: Vascular Surgery | Admitting: Vascular Surgery

## 2014-08-23 DIAGNOSIS — Z791 Long term (current) use of non-steroidal anti-inflammatories (NSAID): Secondary | ICD-10-CM | POA: Insufficient documentation

## 2014-08-23 DIAGNOSIS — Z7982 Long term (current) use of aspirin: Secondary | ICD-10-CM | POA: Insufficient documentation

## 2014-08-23 DIAGNOSIS — E785 Hyperlipidemia, unspecified: Secondary | ICD-10-CM | POA: Diagnosis not present

## 2014-08-23 DIAGNOSIS — Z79899 Other long term (current) drug therapy: Secondary | ICD-10-CM | POA: Insufficient documentation

## 2014-08-23 DIAGNOSIS — I6523 Occlusion and stenosis of bilateral carotid arteries: Secondary | ICD-10-CM

## 2014-08-23 DIAGNOSIS — K219 Gastro-esophageal reflux disease without esophagitis: Secondary | ICD-10-CM | POA: Insufficient documentation

## 2014-08-23 DIAGNOSIS — I6521 Occlusion and stenosis of right carotid artery: Secondary | ICD-10-CM

## 2014-08-23 DIAGNOSIS — I10 Essential (primary) hypertension: Secondary | ICD-10-CM | POA: Insufficient documentation

## 2014-08-23 DIAGNOSIS — Z01818 Encounter for other preprocedural examination: Secondary | ICD-10-CM

## 2014-08-23 DIAGNOSIS — I748 Embolism and thrombosis of other arteries: Secondary | ICD-10-CM | POA: Diagnosis not present

## 2014-08-23 DIAGNOSIS — J449 Chronic obstructive pulmonary disease, unspecified: Secondary | ICD-10-CM | POA: Diagnosis not present

## 2014-08-23 DIAGNOSIS — Z8673 Personal history of transient ischemic attack (TIA), and cerebral infarction without residual deficits: Secondary | ICD-10-CM | POA: Insufficient documentation

## 2014-08-23 DIAGNOSIS — M549 Dorsalgia, unspecified: Secondary | ICD-10-CM | POA: Diagnosis not present

## 2014-08-23 DIAGNOSIS — I6502 Occlusion and stenosis of left vertebral artery: Secondary | ICD-10-CM

## 2014-08-23 DIAGNOSIS — I6529 Occlusion and stenosis of unspecified carotid artery: Principal | ICD-10-CM | POA: Insufficient documentation

## 2014-08-23 DIAGNOSIS — F17210 Nicotine dependence, cigarettes, uncomplicated: Secondary | ICD-10-CM | POA: Insufficient documentation

## 2014-08-23 DIAGNOSIS — G8929 Other chronic pain: Secondary | ICD-10-CM | POA: Diagnosis not present

## 2014-08-23 DIAGNOSIS — I708 Atherosclerosis of other arteries: Secondary | ICD-10-CM | POA: Diagnosis present

## 2014-08-23 DIAGNOSIS — I739 Peripheral vascular disease, unspecified: Secondary | ICD-10-CM | POA: Diagnosis present

## 2014-08-23 DIAGNOSIS — Z7902 Long term (current) use of antithrombotics/antiplatelets: Secondary | ICD-10-CM | POA: Insufficient documentation

## 2014-08-23 HISTORY — PX: CAROTID ANGIOGRAM: SHX5504

## 2014-08-23 HISTORY — PX: OTHER SURGICAL HISTORY: SHX169

## 2014-08-23 HISTORY — PX: ARCH AORTOGRAM: SHX5501

## 2014-08-23 LAB — POCT I-STAT, CHEM 8
BUN: 22 mg/dL (ref 6–23)
CALCIUM ION: 1.16 mmol/L (ref 1.12–1.23)
CHLORIDE: 103 meq/L (ref 96–112)
Creatinine, Ser: 0.8 mg/dL (ref 0.50–1.35)
Glucose, Bld: 98 mg/dL (ref 70–99)
HCT: 39 % (ref 39.0–52.0)
Hemoglobin: 13.3 g/dL (ref 13.0–17.0)
Potassium: 3.6 mEq/L — ABNORMAL LOW (ref 3.7–5.3)
SODIUM: 140 meq/L (ref 137–147)
TCO2: 25 mmol/L (ref 0–100)

## 2014-08-23 SURGERY — ARCH AORTOGRAM
Anesthesia: LOCAL

## 2014-08-23 MED ORDER — ACETAMINOPHEN 325 MG PO TABS: 325.0000 mg | ORAL_TABLET | ORAL | Status: DC | PRN

## 2014-08-23 MED ORDER — LISINOPRIL 20 MG PO TABS
20.0000 mg | ORAL_TABLET | Freq: Every day | ORAL | Status: DC
  Administered 2014-08-23: 20 mg via ORAL
  Filled 2014-08-23 (×2): qty 1

## 2014-08-23 MED ORDER — DOCUSATE SODIUM 100 MG PO CAPS
100.0000 mg | ORAL_CAPSULE | Freq: Two times a day (BID) | ORAL | Status: DC
  Filled 2014-08-23 (×2): qty 1

## 2014-08-23 MED ORDER — OXYCODONE-ACETAMINOPHEN 5-325 MG PO TABS
1.0000 | ORAL_TABLET | ORAL | Status: DC | PRN
  Administered 2014-08-23 – 2014-08-24 (×4): 2 via ORAL
  Filled 2014-08-23 (×4): qty 2

## 2014-08-23 MED ORDER — ACETAMINOPHEN 650 MG RE SUPP: 325.0000 mg | RECTAL | Status: DC | PRN

## 2014-08-23 MED ORDER — AMLODIPINE BESYLATE 10 MG PO TABS
10.0000 mg | ORAL_TABLET | Freq: Every day | ORAL | Status: DC
  Administered 2014-08-23: 10 mg via ORAL
  Filled 2014-08-23 (×2): qty 1

## 2014-08-23 MED ORDER — ONDANSETRON HCL 4 MG/2ML IJ SOLN: 4.0000 mg | Freq: Four times a day (QID) | INTRAMUSCULAR | Status: DC | PRN

## 2014-08-23 MED ORDER — OMEGA-3-ACID ETHYL ESTERS 1 G PO CAPS
2.0000 g | ORAL_CAPSULE | Freq: Two times a day (BID) | ORAL | Status: DC
  Administered 2014-08-23 (×2): 2 g via ORAL
  Filled 2014-08-23 (×5): qty 2

## 2014-08-23 MED ORDER — GABAPENTIN 400 MG PO CAPS
800.0000 mg | ORAL_CAPSULE | Freq: Three times a day (TID) | ORAL | Status: DC
  Administered 2014-08-23 (×3): 800 mg via ORAL
  Filled 2014-08-23 (×7): qty 2

## 2014-08-23 MED ORDER — ALUM & MAG HYDROXIDE-SIMETH 200-200-20 MG/5ML PO SUSP: 15.0000 mL | ORAL | Status: DC | PRN

## 2014-08-23 MED ORDER — METOPROLOL TARTRATE 1 MG/ML IV SOLN: 2.0000 mg | INTRAVENOUS | Status: DC | PRN

## 2014-08-23 MED ORDER — LABETALOL HCL 5 MG/ML IV SOLN
10.0000 mg | INTRAVENOUS | Status: DC | PRN
  Filled 2014-08-23: qty 4

## 2014-08-23 MED ORDER — MORPHINE SULFATE 2 MG/ML IJ SOLN: 2.0000 mg | INTRAMUSCULAR | Status: DC | PRN

## 2014-08-23 MED ORDER — NITROGLYCERIN 0.4 MG SL SUBL: 0.4000 mg | SUBLINGUAL_TABLET | SUBLINGUAL | Status: DC | PRN

## 2014-08-23 MED ORDER — DOCUSATE SODIUM 100 MG PO CAPS: 100.0000 mg | ORAL_CAPSULE | Freq: Every day | ORAL | Status: DC

## 2014-08-23 MED ORDER — HYDROCHLOROTHIAZIDE 12.5 MG PO CAPS
12.5000 mg | ORAL_CAPSULE | Freq: Every day | ORAL | Status: DC
  Administered 2014-08-23: 12.5 mg via ORAL
  Filled 2014-08-23 (×2): qty 1

## 2014-08-23 MED ORDER — ASPIRIN EC 81 MG PO TBEC
81.0000 mg | DELAYED_RELEASE_TABLET | Freq: Every day | ORAL | Status: DC
  Filled 2014-08-23: qty 1

## 2014-08-23 MED ORDER — PANTOPRAZOLE SODIUM 40 MG PO TBEC
40.0000 mg | DELAYED_RELEASE_TABLET | Freq: Every day | ORAL | Status: DC
  Administered 2014-08-23: 40 mg via ORAL
  Filled 2014-08-23: qty 1

## 2014-08-23 MED ORDER — ALBUTEROL SULFATE (2.5 MG/3ML) 0.083% IN NEBU
3.0000 mL | INHALATION_SOLUTION | Freq: Four times a day (QID) | RESPIRATORY_TRACT | Status: DC | PRN
  Administered 2014-08-24: 3 mL via RESPIRATORY_TRACT
  Filled 2014-08-23: qty 3

## 2014-08-23 MED ORDER — CLOPIDOGREL BISULFATE 75 MG PO TABS
75.0000 mg | ORAL_TABLET | Freq: Every day | ORAL | Status: DC
  Filled 2014-08-23: qty 1

## 2014-08-23 MED ORDER — PANTOPRAZOLE SODIUM 40 MG PO TBEC: 40.0000 mg | DELAYED_RELEASE_TABLET | Freq: Every day | ORAL | Status: DC

## 2014-08-23 MED ORDER — LISINOPRIL-HYDROCHLOROTHIAZIDE 20-12.5 MG PO TABS: 1.0000 | ORAL_TABLET | Freq: Every day | ORAL | Status: DC

## 2014-08-23 MED ORDER — POLYETHYLENE GLYCOL 3350 17 G PO PACK
17.0000 g | PACK | Freq: Every day | ORAL | Status: DC | PRN
  Filled 2014-08-23: qty 1

## 2014-08-23 MED ORDER — VARENICLINE TARTRATE 1 MG PO TABS
1.0000 mg | ORAL_TABLET | Freq: Every day | ORAL | Status: DC
  Administered 2014-08-23 – 2014-08-24 (×2): 1 mg via ORAL
  Filled 2014-08-23 (×3): qty 1

## 2014-08-23 MED ORDER — DEXTROSE-NACL 5-0.45 % IV SOLN
INTRAVENOUS | Status: DC
  Administered 2014-08-23: 10:00:00 via INTRAVENOUS

## 2014-08-23 MED ORDER — HYDRALAZINE HCL 20 MG/ML IJ SOLN: 10.0000 mg | INTRAMUSCULAR | Status: DC | PRN

## 2014-08-23 NOTE — Interval H&P Note (Signed)
History and Physical Interval Note:  08/23/2014 7:55 AM  Bradley Sawyer  has presented today for surgery, with the diagnosis of carotid stenosis  The various methods of treatment have been discussed with the patient and family. After consideration of risks, benefits and other options for treatment, the patient has consented to  Procedure(s): ARCH AORTOGRAM (N/A) CAROTID ANGIOGRAM (N/A) as a surgical intervention .  The patient's history has been reviewed, patient examined, no change in status, stable for surgery.  I have reviewed the patient's chart and labs.  Questions were answered to the patient's satisfaction.     Carlyne Keehan E

## 2014-08-23 NOTE — Progress Notes (Signed)
Pt received from cath lab. A/o x3 rt femoral site level 0, dressing dry and intact. Pt on bedrest for 5 hr. Pt VU.

## 2014-08-23 NOTE — Progress Notes (Signed)
Neuro assessment unchanged. No deficits 

## 2014-08-23 NOTE — Progress Notes (Signed)
Site area: rt groin Site Prior to Removal:  Level 0 Pressure Applied For: 20 minutes Manual:   yes Patient Status During Pull:  stable Post Pull Site:  Level 0 Post Pull Instructions Given:  yes Post Pull Pulses Present: yes Dressing Applied:  tegaderm Bedrest begins @ 0930 Comments: no complications

## 2014-08-23 NOTE — Op Note (Addendum)
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 The patient was placed in supine position and the table. Both groins are prepped and draped in usual sterile fashion. Local anesthesia was infiltrated over the right common femoral artery. Ultrasound was used to identify the right common femoral artery. An introducer needle was used to cannulate the right common femoral artery without difficulty. An 035 versacore wire was then threaded up into the abdominal aorta under fluoroscopic guidance. A 5 French sheath was then placed over the guidewire the right common femoral artery. This was thoroughly flushed with heparinized saline. A 5 French pigtail catheter was then placed over the guidewire and advanced as a unit up into the ascending aorta. Arch aortogram was then performed and a 60 LAO projection. This shows normal arch configuration. The proximal innominate artery is patent. There is a large calcified plaque at the distal innominate and bifurcation. This causes subtotal occlusion of the right common carotid artery. There is no antegrade opacification of the right subclavian artery. The right subclavian artery does eventually fill from retrograde flow from the right vertebral artery. The left common carotid artery is patent. The left subclavian artery is patent. The left vertebral artery is large and patent and flowing antegrade. The visualized portions of the right carotid bifurcation are patent. Next the pigtail catheter was pulled back a right guidewire and exchanged for a Berenstein 2 catheter. This was used to selectively catheterize the left common carotid artery. The left  common carotid artery was injected in AP and lateral projection. Intracranial views were also performed in AP and lateral projection for interpretation by the neuroradiologist. The left carotid bifurcation is widely patent. The left common and internal and external carotid arteries are widely patent.  Next resting catheter was pulled back over guidewire back down into the aortic arch. This was then used to selectively catheterize the left subclavian artery. Left subclavian artery injection was performed in AP projection. This shows wide patency of the left subclavian artery as well as the left vertebral artery. There is retrograde flow up and over the posterior communicating artery to the right vertebral artery which eventually fills the right subclavian artery. At this point the [facility] catheter was removed a right guidewire. The 5 French sheath was thoroughly flushed with heparinized saline. The patient was taken to the holding area in stable condition. Patient tolerated procedure well and there were no complications.  Operative management: The patient will be scheduled for a cardiology evaluation to see if he has concomitant coronary occlusive disease. At that point we will discuss whether or not to do a sternotomy for innominate bypass or possibly innominate endarterectomy for stroke prophylaxis.  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  Ruta Hinds, MD [facility] [place] Office: [phone] Pager: [phone]

## 2014-08-23 NOTE — Plan of Care (Signed)
Problem: Phase I Progression Outcomes Goal: OOB as tolerated unless otherwise ordered Outcome: Completed/Met Date Met:  08/23/14 Bedrest up after 5hr.

## 2014-08-23 NOTE — Progress Notes (Signed)
No neuro deficits. Speech clear and appropriate. PERRL. Upper and lower extremities equal in strength. Alert and oriented. Smile symmetrical.

## 2014-08-23 NOTE — H&P (View-Only) (Signed)
[facility] [place] HISTORY AND PHYSICAL   History of Present Illness:  Patient is a 58 y.o. year old male who presents for evaluation of right subclavian artery stenosis. This was found incidentally on measuring the blood pressure in his right arm. The patient denies any exertional fatigue in the right arm. He does have occasional numbness in both hands. However the right is not worse than left. He states he did have a stroke in the past which Inderal his speech memory and right hand. This was after a back operation in February of 2015. He fully recovered. He denies any recent symptoms of TIA or stroke. He denies any symptoms of dizziness. However, he has had several episodes recently where he had a lower field cut in his right eye. This would resolve and a few minutes. He is on aspirin twice daily. He currently is a smoker but is trying to quit. He is right handed. He states that they have not been able to get a blood pressure in his right arm for several months.  Other medical problems include hyperlipidemia, hypertension, tobacco abuse, COPD, chronic back pain all of which are currently stable. He has had previous cervical and lumbar fusion.  Past Medical History  Diagnosis Date  . Hyperlipidemia   . Hypertension   . GERD (gastroesophageal reflux disease)   . Tobacco abuse   . COPD (chronic obstructive pulmonary disease)     bronchitis  . Disorder of vocal cord   . Cocaine dependence     Last use 1998, attends 3 NA meetings weekly.  . Right shoulder pain     2/2 partial rotator cuff tear, tendinopathy, mild subacromial/subdeltioid bursitis, A/C joint arthropathy per MRI done in 3/07  . Arthritis   . Anxiety   . Compression fracture     L2  . CVA (cerebral infarction)   . Fatty liver     Past Surgical History  Procedure Laterality Date  . Neck surgery    . Lumbar spine surgery  12/18/2013    L2     DR POOL  . Anterior lat lumbar fusion N/A 12/18/2013   Procedure: Lumbar Two Anteriorlateral Corpectomy w/ cage, interbody fusion, anterior plating, Lumbar One to Lumbar Three percutaneous pedicle screws;  Surgeon: Charlie Pitter, MD;  Location: [facility];  Service: Neurosurgery;  Laterality: N/A;  . Tee without cardioversion N/A 12/25/2013    Procedure: TRANSESOPHAGEAL ECHOCARDIOGRAM (TEE);  Surgeon: Larey Dresser, MD;  Location[facility] END[facility]COPY;  Service: Cardiovascular;  Laterality: N/A;    Social History History  Substance Use Topics  . Smoking status: Current Every Day Smoker -- 1.00 packs/day for 48 years    Types: Cigarettes  . Smokeless tobacco: Never Used     Comment: Started back x 2 weeks.  . Alcohol Use: No    Family History Family History  Problem Relation Age of Onset  . Diabetes Paternal Grandmother     1 st degree relatives  . Heart disease Father   . Diabetes Brother   . Heart disease Brother   . Muscular dystrophy Brother   . Muscular dystrophy Sister     Allergies  No Known Allergies   Current Outpatient Prescriptions  Medication Sig Dispense Refill  . albuterol (PROVENTIL HFA;VENTOLIN HFA) 108 (90 BASE) MCG/ACT inhaler Inhale 2 puffs into the lungs every 6 (six) hours as needed for wheezing.  3 Inhaler  4  . amLODipine (NORVASC) 10 MG tablet Take 1 tablet (10 mg  total) by mouth daily.  30 tablet  11  . aspirin EC 325 MG EC tablet Take 1 tablet (325 mg total) by mouth daily.  30 tablet  0  . clopidogrel (PLAVIX) 75 MG tablet Take 1 tablet (75 mg total) by mouth daily.  30 tablet  6  . diazepam (VALIUM) 5 MG tablet Take 1-2 tablets (5-10 mg total) by mouth every 6 (six) hours as needed for muscle spasms.  60 tablet  0  . FLUoxetine (PROZAC) 10 MG capsule Take 1 capsule (10 mg total) by mouth daily.  90 capsule  4  . Fluticasone-Salmeterol (ADVAIR) 500-50 MCG/DOSE AEPB Inhale 1 puff into the lungs every 12 (twelve) hours.  60 each  4  . gabapentin (NEURONTIN) 400 MG capsule Take 2 capsules (800 mg total) by mouth 3  (three) times daily.  180 capsule  4  . lisinopril-hydrochlorothiazide (PRINZIDE) 20-12.5 MG per tablet Take 1 tablet by mouth daily.  30 tablet  6  . meloxicam (MOBIC) 15 MG tablet Take 1 tablet (15 mg total) by mouth daily.  30 tablet  2  . nitroGLYCERIN (NITROSTAT) 0.4 MG SL tablet Place 1 tablet (0.4 mg total) under the tongue every 5 (five) minutes as needed for chest pain.  25 tablet  8  . omega-3 acid ethyl esters (LOVAZA) 1 G capsule Take 2 capsules (2 g total) by mouth 2 (two) times daily.  90 capsule  3  . oxyCODONE-acetaminophen (ROXICET) 5-325 MG per tablet Take 1-2 tablets by mouth every 6 (six) hours as needed.  180 tablet  0  . pantoprazole (PROTONIX) 40 MG tablet Take 1 tablet (40 mg total) by mouth daily.  30 tablet  6  . pravastatin (PRAVACHOL) 20 MG tablet Take 1 tablet (20 mg total) by mouth daily.  30 tablet  11   No current facility-administered medications for this visit.    ROS:   General:  No weight loss, Fever, chills  HEENT: No recent headaches, no nasal bleeding, no visual changes, no sore throat  Neurologic: No dizziness, blackouts, seizures. No recent symptoms of stroke or mini- stroke. No recent episodes of slurred speech, or temporary blindness.  Cardiac: No recent episodes of chest pain/pressure, no shortness of breath at rest.  + shortness of breath with exertion.  Denies history of atrial fibrillation or irregular heartbeat  Vascular: No history of rest pain in feet.  No history of claudication.  No history of non-healing ulcer, No history of DVT   Pulmonary: No home oxygen, no productive cough, no hemoptysis,  + asthma or wheezing  Musculoskeletal:  [ x] Arthritis, [x ] Low back pain,  [x ] Joint pain  Hematologic:No history of hypercoagulable state.  No history of easy bleeding.  No history of anemia  Gastrointestinal: No hematochezia or melena,  No gastroesophageal reflux, no trouble swallowing  Urinary: [ ]  chronic Kidney disease, [ ]  on HD - [ ]   MWF or [ ]  TTHS, [ ]  Burning with urination, [ ]  Frequent urination, [ ]  Difficulty urinating;   Skin: No rashes  Psychological: + history of anxiety,  No history of depression   Physical Examination  Filed Vitals:   07/25/14 0936 07/25/14 0939  BP: 109/59 129/72  Pulse: 94 60  Temp: 98.3 F (36.8 C)   TempSrc: Oral   Resp: 16   Height: 5\' 11"  (1.803 m)   Weight: 217 lb 8 oz (98.657 kg)   SpO2: 98%     Body mass index is  30.35 kg/(m^2).  General:  Alert and oriented, no acute distress HEENT: Normal Neck: 1-2+ right carotid pulse 2+ left carotid pulse bilateral carotid bruits, well-healed right transverse neck scar from previous fusion  Pulmonary: Coarse breath sounds at bases bilaterally Cardiac: Regular Rate and Rhythm without murmur Abdomen: Soft, non-tender, non-distended, no mass Skin: No rash Extremity Pulses:  2+ radial, brachial, femoral, dorsalis pedis, posterior tibial pulses bilaterally Musculoskeletal: No deformity or edema  Neurologic: Upper and lower extremity motor 5/5 and symmetric  DATA:  Carotid duplex scan dated 07/25/2014 done in our office today. I reviewed and interpreted this study. This shows bilateral less than 40% internal carotid artery stenosis. Right brachial pressure 80 left brachial pressure 138 right carotid system velocities decreased suggestive of right common carotid stenosis.   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 His right eye symptoms may be related to this. If this is a high-grade stenosis he may be at risk of stroke. His right subclavian his most likely affected by this innominate artery stenosis. However, this is asymptomatic.   PLAN:  Arch and carotid angio possible innominate or CCA stent on  9/25.  Start Plavix in combo with ASA today.  Risks benefits possible complications of angio and intervention including but  not limited possible stroke risk to  bleeding infection contrast reaction were explained to the patient today. We will plan on a 1 night hospital stay if we do an intervention. However if it is a diagnostic Angio only he will be a little on same day after his bed rest.   Ruta Hinds, MD [facility] [place]: [phone] Pager: [phone]

## 2014-08-23 NOTE — Progress Notes (Signed)
Pt bedrest up. Rt femoral groin level 0, placed bandaid.

## 2014-08-24 DIAGNOSIS — I6529 Occlusion and stenosis of unspecified carotid artery: Secondary | ICD-10-CM | POA: Diagnosis not present

## 2014-08-24 LAB — CBC
HEMATOCRIT: 38.1 % — AB (ref 39.0–52.0)
Hemoglobin: 12.8 g/dL — ABNORMAL LOW (ref 13.0–17.0)
MCH: 31.2 pg (ref 26.0–34.0)
MCHC: 33.6 g/dL (ref 30.0–36.0)
MCV: 92.9 fL (ref 78.0–100.0)
Platelets: 176 10*3/uL (ref 150–400)
RBC: 4.1 MIL/uL — AB (ref 4.22–5.81)
RDW: 13.2 % (ref 11.5–15.5)
WBC: 10.2 10*3/uL (ref 4.0–10.5)

## 2014-08-24 LAB — BASIC METABOLIC PANEL
Anion gap: 11 (ref 5–15)
BUN: 18 mg/dL (ref 6–23)
CO2: 28 meq/L (ref 19–32)
CREATININE: 0.95 mg/dL (ref 0.50–1.35)
Calcium: 9.2 mg/dL (ref 8.4–10.5)
Chloride: 102 mEq/L (ref 96–112)
GFR calc Af Amer: >90 mL/min (ref >90)
GFR calc non Af Amer: >90 mL/min (ref >90)
Glucose, Bld: 100 mg/dL — ABNORMAL HIGH (ref 70–99)
Potassium: 4.1 mEq/L (ref 3.7–5.3)
Sodium: 141 mEq/L (ref 137–147)

## 2014-08-24 NOTE — Progress Notes (Signed)
Patient d/c home, d/c instruction given, patient verbalized understanding.Condition stable.

## 2014-08-24 NOTE — Progress Notes (Addendum)
  Progress Note    08/24/2014 7:33 AM 1 Day Post-Op  Subjective:  No complaints.  Ready to go home.   Afebrile VSS 96% RA  Filed Vitals:   08/24/14 0617  BP: 117/87  Pulse: 54  Temp: 98 F (36.7 C)  Resp: 18    Physical Exam: Cardiac:  Regular Lungs:  Non labored Incisions:  Right groin is soft without hematoma Extremities:  Right foot warm with palpable DP  CBC    Component Value Date/Time   WBC 10.2 08/24/2014 0034   RBC 4.10* 08/24/2014 0034   HGB 12.8* 08/24/2014 0034   HCT 38.1* 08/24/2014 0034   PLT 176 08/24/2014 0034   MCV 92.9 08/24/2014 0034   MCH 31.2 08/24/2014 0034   MCHC 33.6 08/24/2014 0034   RDW 13.2 08/24/2014 0034   LYMPHSABS 4.3* 07/10/2014 1459   MONOABS 1.0 07/10/2014 1459   EOSABS 0.0 07/10/2014 1459   BASOSABS 0.0 07/10/2014 1459    BMET    Component Value Date/Time   NA 141 08/24/2014 0034   K 4.1 08/24/2014 0034   CL 102 08/24/2014 0034   CO2 28 08/24/2014 0034   GLUCOSE 100* 08/24/2014 0034   BUN 18 08/24/2014 0034   CREATININE 0.95 08/24/2014 0034   CREATININE 1.01 03/12/2013 1111   CALCIUM 9.2 08/24/2014 0034   GFRNONAA >90 08/24/2014 0034   GFRNONAA 83 03/12/2013 1111   GFRAA >90 08/24/2014 0034   GFRAA >89 03/12/2013 1111    INR No results found for this basename: inr     Intake/Output Summary (Last 24 hours) at 08/24/14 0733 Last data filed at 08/23/14 1700  Gross per 24 hour  Intake    960 ml  Output    400 ml  Net    560 ml     Assessment:  58 y.o. male is s/p:  Arch aortogram, selective left carotid left subclavian angiogram  1 Day Post-Op  Plan: -pt doing well this am -right groin is soft without hematoma -discharge home this am -will need cardiology consult prior to possible innominate bypass or possible innominate endarterectomy for stroke prophylaxis.  He will f/u in our office after cardiology evaluation.   Leontine Locket, PA-C [facility] [phone] 08/24/2014 7:33 AM   Cardiology workup  followed by operation in the next few weeks Neuro intact today No hematoma  Ruta Hinds, MD [facility] [place] Office: [phone] Pager: [phone]

## 2014-08-24 NOTE — Progress Notes (Signed)
Patient drove himself to hospital for this admission and wants to know if it is ok to drive himself back, MD paged and he called back to said he would prefer the patient to get somebody to drive him home. Patient was made aware.

## 2014-08-24 NOTE — Discharge Summary (Signed)
Discharge Summary    Bradley Sawyer [birth year] 58 y.o. male  [id number]  Admission Date: 08/23/2014  Discharge Date: 08/24/14  Physician: Elam Dutch, MD  Admission Diagnosis: carotid stenosis   HPI:   This is a 57 y.o. male who presents for evaluation of right subclavian artery stenosis. This was found incidentally on measuring the blood pressure in his right arm. The patient denies any exertional fatigue in the right arm. He does have occasional numbness in both hands. However the right is not worse than left. He states he did have a stroke in the past which Inderal his speech memory and right hand. This was after a back operation in February of 2015. He fully recovered. He denies any recent symptoms of TIA or stroke. He denies any symptoms of dizziness. However, he has had several episodes recently where he had a lower field cut in his right eye. This would resolve and a few minutes. He is on aspirin twice daily. He currently is a smoker but is trying to quit. He is right handed. He states that they have not been able to get a blood pressure in his right arm for several months. Other medical problems include hyperlipidemia, hypertension, tobacco abuse, COPD, chronic back pain all of which are currently stable. He has had previous cervical and lumbar fusion.   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  The pt tolerated the procedure well and was transported to the PACU in good condition.   By POD 1, he was doing well.  His right groin was soft without hematoma.  He has a palpable right DP pulse.   The remainder of the hospital course consisted of increasing mobilization and increasing intake of solids without  difficulty.  CBC    Component Value Date/Time   WBC 10.2 08/24/2014 0034   RBC 4.10* 08/24/2014 0034   HGB 12.8* 08/24/2014 0034   HCT 38.1* 08/24/2014 0034   PLT 176 08/24/2014 0034   MCV 92.9 08/24/2014 0034   MCH 31.2 08/24/2014 0034   MCHC 33.6 08/24/2014 0034   RDW 13.2 08/24/2014 0034   LYMPHSABS 4.3* 07/10/2014 1459   MONOABS 1.0 07/10/2014 1459   EOSABS 0.0 07/10/2014 1459   BASOSABS 0.0 07/10/2014 1459    BMET    Component Value Date/Time   NA 141 08/24/2014 0034   K 4.1 08/24/2014 0034   CL 102 08/24/2014 0034   CO2 28 08/24/2014 0034   GLUCOSE 100* 08/24/2014 0034   BUN 18 08/24/2014 0034   CREATININE 0.95 08/24/2014 0034   CREATININE 1.01 03/12/2013 1111   CALCIUM 9.2 08/24/2014 0034   GFRNONAA >90 08/24/2014 0034   GFRNONAA 83 03/12/2013 1111   GFRAA >90 08/24/2014 0034   GFRAA >89 03/12/2013 1111     Discharge Instructions:   The patient is discharged with extensive instructions on wound care and progressive ambulation.  They are instructed not to drive or perform any heavy lifting until returning to see the physician in his office.  Discharge Instructions   Call MD for:  redness, tenderness, or signs of infection (pain, swelling, bleeding, redness, odor or green/yellow discharge around incision site)    Complete by:  As directed      Call MD for:  severe or increased pain, loss or decreased  feeling  in affected limb(s)    Complete by:  As directed      Call MD for:  temperature >100.5    Complete by:  As directed      Discharge wound care:    Complete by:  As directed   Shower daily with soap and water starting 08/25/14     Driving Restrictions    Complete by:  As directed   No driving for 48 hours and while taking pain medication.     Lifting restrictions    Complete by:  As directed   No lifting for 3 weeks     Resume previous diet    Complete by:  As directed            Discharge Diagnosis:  carotid stenosis  Secondary Diagnosis: Patient Active Problem List    Diagnosis Date Noted  . PAD (peripheral artery disease) 08/23/2014  . Contusion of finger with damage to nail 08/12/2014  . Preventative health care 08/12/2014  . Occlusion and stenosis of carotid artery without mention of cerebral infarction 07/25/2014  . Subclavian steal syndrome 07/25/2014  . CAD (coronary artery disease) 07/10/2014  . Unequal blood pressure in upper extremities 04/10/2014  . CVA (cerebral infarction) 04/10/2014  . 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 Hematuria 08/06/2013  . Compression fracture of L2, with back and right groin pain 07/18/2013  . History of colon polyps 05/18/2012  . COPD 12/24/2009  . SHOULDER PAIN, RIGHT 12/13/2006  . HYPERLIPIDEMIA 09/20/2006  . TOBACCO ABUSE 09/20/2006  . HYPERTENSION 09/20/2006  . VOCAL CORD DISORDER 09/20/2006  . GERD 09/20/2006   Past Medical History  Diagnosis Date  . Hyperlipidemia   . Hypertension   . GERD (gastroesophageal reflux disease)   . Tobacco abuse   . COPD (chronic obstructive pulmonary disease)     bronchitis  . Disorder of vocal cord   . Cocaine dependence     Last use 1998, attends 3 NA meetings weekly.  . Right shoulder pain     2/2 partial rotator cuff tear, tendinopathy, mild subacromial/subdeltioid bursitis, A/C joint arthropathy per MRI done in 3/07  . Arthritis   . Anxiety   . Compression fracture     L2  . CVA (cerebral infarction)   . Fatty liver        Medication List         albuterol 108 (90 BASE) MCG/ACT inhaler  Commonly known as:  PROVENTIL HFA;VENTOLIN HFA  Inhale 2 puffs into the lungs every 6 (six) hours as needed for wheezing.     amLODipine 10 MG tablet  Commonly known as:  NORVASC  Take 1 tablet (10 mg total) by mouth daily.     aspirin EC 81 MG tablet  Take 81 mg by mouth daily.     atorvastatin 20 MG tablet  Commonly known as:  LIPITOR  Take 20 mg by mouth daily.     clopidogrel 75 MG tablet  Commonly known as:  PLAVIX  Take 1  tablet (75 mg total) by mouth daily.     docusate sodium 100 MG capsule  Commonly known as:  COLACE  Take 100 mg by mouth 2 (two) times daily.     Fluticasone-Salmeterol 500-50 MCG/DOSE Aepb  Commonly known as:  ADVAIR  Inhale 1 puff into the lungs every 12 (twelve) hours.     gabapentin 400 MG capsule  Commonly known as:  NEURONTIN  Take 2 capsules (800 mg total)  by mouth 3 (three) times daily.     lisinopril-hydrochlorothiazide 20-12.5 MG per tablet  Commonly known as:  PRINZIDE  Take 1 tablet by mouth daily.     nitroGLYCERIN 0.4 MG SL tablet  Commonly known as:  NITROSTAT  Place 1 tablet (0.4 mg total) under the tongue every 5 (five) minutes as needed for chest pain.     omega-3 acid ethyl esters 1 G capsule  Commonly known as:  LOVAZA  Take 2 capsules (2 g total) by mouth 2 (two) times daily.     oxyCODONE-acetaminophen 7.5-325 MG per tablet  Commonly known as:  PERCOCET  Take 1 tablet by mouth every 6 (six) hours as needed for pain.     pantoprazole 40 MG tablet  Commonly known as:  PROTONIX  Take 1 tablet (40 mg total) by mouth daily.     polyethylene glycol packet  Commonly known as:  MIRALAX / GLYCOLAX  Take 17 g by mouth daily as needed (for constipation).     pravastatin 20 MG tablet  Commonly known as:  PRAVACHOL  Take 1 tablet (20 mg total) by mouth daily.     varenicline 0.5 MG tablet  Commonly known as:  CHANTIX  Days 1 to 3: 0.5 mg once daily then day 4 to 7: 0.5 mg twice daily, then day 8 and on: 1 mg twice daily for 11 weeks.        No pain medication Rx given.  Disposition: home  Patient's condition: is Good  Follow up: 1. Dr. Oneida Alar after cardiology evaluation prior to possible innominate bypass or possible innominate endarterectomy for stroke prophylaxis    Leontine Locket, PA-C [facility] [phone] 08/24/2014  7:37 AM

## 2014-08-26 ENCOUNTER — Telehealth: Payer: Self-pay | Admitting: Vascular Surgery

## 2014-08-26 NOTE — Telephone Encounter (Addendum)
Message copied by Gena Fray on Mon Aug 26, 2014 11:54 AM ------      Message from: Mena Goes      Created: Fri Aug 23, 2014  3:45 PM      Regarding: Schedule                   ----- Message -----         From: Elam Dutch, MD         Sent: 08/23/2014   9:03 AM           To: [facility] Charge Pool            Pt needs cardiology eval preop for possible innominate bypass or endarterectomy.  He needs to see me after cardiology appt.            He has seen [facility] in the past            Arch aortogram first order left SCA and leftCCA            Ultrasound of groin.            Ruta Hinds, MD      [facility] [place]      Office: [phone]      Pager: [phone]       ------  08/26/14: spoke with Mikeal Hawthorne to give appt info. Cards appt 09/09/14 @ 10:45am and Dr Oneida Alar 09/11/14 @ 9:30am) dpm

## 2014-08-28 ENCOUNTER — Ambulatory Visit (INDEPENDENT_AMBULATORY_CARE_PROVIDER_SITE_OTHER): Payer: Medicaid Other | Admitting: Physician Assistant

## 2014-08-28 ENCOUNTER — Encounter: Payer: Self-pay | Admitting: Physician Assistant

## 2014-08-28 VITALS — BP 116/69 | HR 67 | Ht 71.0 in | Wt 221.0 lb

## 2014-08-28 DIAGNOSIS — I10 Essential (primary) hypertension: Secondary | ICD-10-CM

## 2014-08-28 DIAGNOSIS — E785 Hyperlipidemia, unspecified: Secondary | ICD-10-CM

## 2014-08-28 DIAGNOSIS — Z720 Tobacco use: Secondary | ICD-10-CM

## 2014-08-28 DIAGNOSIS — J449 Chronic obstructive pulmonary disease, unspecified: Secondary | ICD-10-CM

## 2014-08-28 DIAGNOSIS — I2510 Atherosclerotic heart disease of native coronary artery without angina pectoris: Secondary | ICD-10-CM

## 2014-08-28 DIAGNOSIS — Z01810 Encounter for preprocedural cardiovascular examination: Secondary | ICD-10-CM | POA: Insufficient documentation

## 2014-08-28 DIAGNOSIS — I739 Peripheral vascular disease, unspecified: Secondary | ICD-10-CM

## 2014-08-28 DIAGNOSIS — F17200 Nicotine dependence, unspecified, uncomplicated: Secondary | ICD-10-CM

## 2014-08-28 NOTE — Patient Instructions (Addendum)
Your physician has requested that you have en exercise stress myoview THIS IS FOR SURGERY CLEARANCE. For further information please visit [email or web address]. Please follow instruction sheet, as given.  Your physician recommends that you schedule a follow-up appointment in: 3 MONTHS WITH DR. Johnsie Cancel  NO CHANGES WERE MADE WITH MEDICATIONS

## 2014-08-28 NOTE — Progress Notes (Signed)
Cardiology Office Note    Date:  08/28/2014   ID:  Bradley Sawyer, DOB 12-Oct-1956, MRN [id number]  PCP:  Albin Felling, MD  Cardiologist:  Dr. Jenkins Rouge      History of Present Illness: Bradley Sawyer is a 58 y.o. male a history of HTN, HL, COPD, tobacco abuse. He was seen in 2013 by Dr. Johnsie Cancel after a visit to the emergency room with chest pain. Cardiac CTA demonstrated nonobstructive CAD.  Patient had lumbar spine surgery in 11/2013. Postoperative course was complicated by embolic right hemispheric CVA.  The patient was recently evaluated by Dr. Oneida Alar for vascular surgery due to right subclavian stenosis.  This was found incidentally due to blood pressure discrepancy.  Patient did report lower visual field cut in the right eye at times. This was felt to possibly be related to his subclavian stenosis. Patient recently underwent arch aortogram. 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 Innominate bypass or possible innominate endarterectomy has been recommended. He returns for preoperative cardiovascular evaluation.   Patient notes that he recently started working out at the [facility] ~ 4-5 weeks ago.  He was lifting weights (30-40 lbs), walking 1 1/2 miles, swimming in the pool.  He had no chest discomfort with this.  He has chronic DOE related to COPD without significant change.  He is probably NYHA 2b.  He denies orthopnea, PND, edema.  He denies syncope.  He still has occasional "pinching" in his L chest.  He has had this for years (since he saw Dr. Johnsie Cancel) without change.  It does not occur with exertion.  He has no associated symptoms.    Studies:  - TEE (2/15):  EF 55-60%, normal wall motion, normal caliber aortic arch and descending thoracic aorta with grade 3 plaque, mild LAE, normal RVSF, no cardiac source of emboli  - Echo (1/15):  EF 55-60%, normal wall motion  - Carotid US (9/15):  Bilateral ICA <40%;  known right subclavian steal  - Cardiac CTA (11/13):  Ca Score 419, LM 0-25%, LAD 25-50%, ostial D2 0-25%, prox CFX 0-25%, OM1 0-25%, RCA 0-25%; small R lung nodules (4 mm) >> FU CT recommended 1 year (unless low risk for bronchogenic CA); severe hepatic steatosis - FU CT at [facility] in 8/14: no masses identified   Recent Labs/Images:   Recent Labs  12/22/13 0057  08/24/14 0034  NA  --   < > 141  K  --   < > 4.1  BUN  --   < > 18  CREATININE  --   < > 0.95  HGB  --   < > 12.8*  LDLCALC 52  --   --   HDL 41  --   --   < > = values in this interval not displayed.   No results found.   Wt Readings from Last 3 Encounters:  08/23/14 220 lb (99.791 kg)  08/23/14 220 lb (99.791 kg)  08/12/14 220 lb 11.2 oz (100.109 kg)     Past Medical History  Diagnosis Date  . Hyperlipidemia   . Hypertension   . GERD (gastroesophageal reflux disease)   . Tobacco abuse   . COPD (chronic obstructive pulmonary disease)     bronchitis  . Disorder of vocal cord   . Cocaine dependence     Last use 1998, attends 3 NA meetings weekly.  . Right shoulder pain     2/2 partial  rotator cuff tear, tendinopathy, mild subacromial/subdeltioid bursitis, A/C joint arthropathy per MRI done in 3/07  . Arthritis   . Anxiety   . Compression fracture     L2  . CVA (cerebral infarction)   . Fatty liver     Current Outpatient Prescriptions  Medication Sig Dispense Refill  . albuterol (PROVENTIL HFA;VENTOLIN HFA) 108 (90 BASE) MCG/ACT inhaler Inhale 2 puffs into the lungs every 6 (six) hours as needed for wheezing.  3 Inhaler  2  . amLODipine (NORVASC) 10 MG tablet Take 1 tablet (10 mg total) by mouth daily.  30 tablet  11  . aspirin EC 81 MG tablet Take 81 mg by mouth daily.      Marland Kitchen atorvastatin (LIPITOR) 20 MG tablet Take 20 mg by mouth daily.      . clopidogrel (PLAVIX) 75 MG tablet Take 1 tablet (75 mg total) by mouth daily.  30 tablet  6  . docusate sodium (COLACE) 100 MG capsule Take 100 mg by mouth 2 (two)  times daily.      . Fluticasone-Salmeterol (ADVAIR) 500-50 MCG/DOSE AEPB Inhale 1 puff into the lungs every 12 (twelve) hours.  180 each  4  . gabapentin (NEURONTIN) 400 MG capsule Take 2 capsules (800 mg total) by mouth 3 (three) times daily.  180 capsule  4  . lisinopril-hydrochlorothiazide (PRINZIDE) 20-12.5 MG per tablet Take 1 tablet by mouth daily.  30 tablet  6  . nitroGLYCERIN (NITROSTAT) 0.4 MG SL tablet Place 1 tablet (0.4 mg total) under the tongue every 5 (five) minutes as needed for chest pain.  25 tablet  8  . omega-3 acid ethyl esters (LOVAZA) 1 G capsule Take 2 capsules (2 g total) by mouth 2 (two) times daily.  90 capsule  3  . oxyCODONE-acetaminophen (PERCOCET) 7.5-325 MG per tablet Take 1 tablet by mouth every 6 (six) hours as needed for pain.      . pantoprazole (PROTONIX) 40 MG tablet Take 1 tablet (40 mg total) by mouth daily.  30 tablet  6  . polyethylene glycol (MIRALAX / GLYCOLAX) packet Take 17 g by mouth daily as needed (for constipation).      . pravastatin (PRAVACHOL) 20 MG tablet Take 1 tablet (20 mg total) by mouth daily.  30 tablet  11  . varenicline (CHANTIX) 0.5 MG tablet Days 1 to 3: 0.5 mg once daily then day 4 to 7: 0.5 mg twice daily, then day 8 and on: 1 mg twice daily for 11 weeks.  150 tablet  0   No current facility-administered medications for this visit.     Allergies:   Review of patient's allergies indicates no known allergies.   Social History:  The patient  reports that he has been smoking Cigarettes.  He has a 48 pack-year smoking history. He has never used smokeless tobacco. He reports that he uses illicit drugs (Cocaine and Marijuana). He reports that he does not drink alcohol.   Family History:  The patient's family history includes Diabetes in his brother and paternal grandmother; Heart disease in his brother and father; Muscular dystrophy in his brother and sister.   ROS:  Please see the history of present illness.      All other systems  reviewed and negative.    PHYSICAL EXAM: VS:  BP 116/69  Pulse 67  Ht 5\' 11"  (1.803 m)  Wt 221 lb (100.245 kg)  BMI 30.84 kg/m2 Well nourished, well developed, in no acute distress HEENT: normal Neck: no  JVD Cardiac:  normal S1, S2; RRR; no murmur Lungs:  Decreased breath sounds bilaterally, no wheezing, rhonchi or rales Abd: soft, nontender, no hepatomegaly Ext: no edema Skin: warm and dry Neuro:  CNs 2-12 intact, no focal abnormalities noted  EKG:  NSR, HR 67, normal axis, no ST changes      ASSESSMENT AND PLAN:  1.  Pre-operative cardiovascular examination:  He presents for presurgical cardiovascular evaluation. He is able to achieve approximately 4 METs without anginal symptoms. However, his risk factor profile is extensive. Vascular surgery is planned and this is felt to be high risk. I reviewed this with Dr. Johnsie Cancel. We will arrange ETT-Myoview for risk stratification. If this demonstrates high risk features, he will need cardiac catheterization. If this is low risk, he may proceed with his vascular surgery without further cardiac intervention.  2.  Coronary artery disease no angina: Proceed with Myoview as noted. Continue aspirin, Plavix, statin.  3.  PAD (peripheral artery disease): Followup with Dr. Oneida Alar as planned.   4.  Essential hypertension: Controlled.   5.  Hyperlipemia: Continue statin.   6.  TOBACCO ABUSE: He is trying to quit.   7.  Chronic obstructive pulmonary disease: Continue Proventil, Advair.    Disposition:    FU with  Dr. Johnsie Cancel in 3 months or sooner if his stress test is abnormal.    Signed, Richardson Dopp, PA-C, MHS 08/28/2014 8:25 AM    [facility] [place], [place], [place]  [address] Phone: [phone]; Fax: [phone]

## 2014-08-30 ENCOUNTER — Telehealth: Payer: Self-pay | Admitting: Vascular Surgery

## 2014-08-30 NOTE — Telephone Encounter (Addendum)
Message copied by Gena Fray on Fri Aug 30, 2014  1:56 PM ------      Message from: Denman George      Created: Fri Aug 30, 2014  1:22 PM      Regarding: needs appt. with Dr. Servando Snare       This pt. needs an appt. with Dr. Servando Snare.  He will be scheduled for an Innominate BP or Innominate Endarterectomy, and my need a sternotomy by Dr. Servando Snare for this surgery.  If poss. try to get this appt. Prior to coming back to see Dr. Oneida Alar on 10/21.   ------  08/30/14: Faxed referral request to [facility] ([phone]) along with notes, dpm

## 2014-09-02 ENCOUNTER — Ambulatory Visit (HOSPITAL_COMMUNITY): Payer: Medicaid Other | Attending: Cardiovascular Disease | Admitting: Radiology

## 2014-09-02 VITALS — BP 122/63 | Ht 71.0 in | Wt 216.0 lb

## 2014-09-02 DIAGNOSIS — R0600 Dyspnea, unspecified: Secondary | ICD-10-CM | POA: Diagnosis not present

## 2014-09-02 DIAGNOSIS — R079 Chest pain, unspecified: Secondary | ICD-10-CM | POA: Insufficient documentation

## 2014-09-02 DIAGNOSIS — E785 Hyperlipidemia, unspecified: Secondary | ICD-10-CM

## 2014-09-02 DIAGNOSIS — I2510 Atherosclerotic heart disease of native coronary artery without angina pectoris: Secondary | ICD-10-CM

## 2014-09-02 DIAGNOSIS — Z01810 Encounter for preprocedural cardiovascular examination: Secondary | ICD-10-CM

## 2014-09-02 DIAGNOSIS — R002 Palpitations: Secondary | ICD-10-CM | POA: Diagnosis not present

## 2014-09-02 MED ORDER — TECHNETIUM TC 99M SESTAMIBI GENERIC - CARDIOLITE
11.0000 | Freq: Once | INTRAVENOUS | Status: AC | PRN
  Administered 2014-09-02: 11 via INTRAVENOUS

## 2014-09-02 MED ORDER — TECHNETIUM TC 99M SESTAMIBI GENERIC - CARDIOLITE
33.0000 | Freq: Once | INTRAVENOUS | Status: AC | PRN
  Administered 2014-09-02: 33 via INTRAVENOUS

## 2014-09-02 MED ORDER — REGADENOSON 0.4 MG/5ML IV SOLN
0.4000 mg | Freq: Once | INTRAVENOUS | Status: AC
  Administered 2014-09-02: 0.4 mg via INTRAVENOUS

## 2014-09-02 NOTE — Progress Notes (Signed)
[facility] [address] [place],  [address] [phone]    Cardiology Nuclear Med Study  Bradley Sawyer is a 58 y.o. male     MRN : [id number]     DOB: 1956-05-07  Procedure Date: 09/02/2014  Nuclear Med Background Indication for Stress Test:  Evaluation for Ischemia and Surgical Clearance:Right Subclavian Artery Bypass with Dr. Oneida Alar History:  COPD and CAD Cardiac Risk Factors: CVD, N/O CAD  Symptoms:  Chest Pain, DOE and Palpitations   Nuclear Pre-Procedure Caffeine/Decaff Intake:  7:00pm NPO After: 11:00pm   Lungs:  clear O2 Sat: 90-92% on room air. IV 0.9% NS with Angio Cath:  20g  IV Site: L Hand  IV Started by:  Matilde Haymaker, RN  Chest Size (in):  46 Cup Size: n/a  Height: 5\' 11"  (1.803 m)  Weight:  216 lb (97.977 kg)  BMI:  Body mass index is 30.14 kg/(m^2). Tech Comments:  n/a    Nuclear Med Study 1 or 2 day study: 1 day  Stress Test Type:  Treadmill/Lexiscan  Reading MD: n/a  Order Authorizing Provider:  Verlin Dike  Resting Radionuclide: Technetium 107m Sestamibi  Resting Radionuclide Dose: 11.0 mCi   Stress Radionuclide:  Technetium 30m Sestamibi  Stress Radionuclide Dose: 33.0 mCi           Stress Protocol Rest HR: 56 Stress HR: 116  Rest BP: 122/63 Stress BP: 213/64  Exercise Time (min): 3:43 METS: 5.40   Predicted Max HR: 162 bpm % Max HR: 71.6 bpm Rate Pressure Product: 24708   Dose of Adenosine (mg):  n/a Dose of Lexiscan: 0.4 mg  Dose of Atropine (mg): n/a Dose of Dobutamine: n/a mcg/kg/min (at max HR)  Stress Test Technologist: Perrin Maltese, EMT-P  Nuclear Technologist:  Vedia Pereyra, CNMT     Rest Procedure:  Myocardial perfusion imaging was performed at rest 45 minutes following the intravenous administration of Technetium 20m Sestamibi. Rest ECG: NSR - Normal EKG  Stress Procedure:  The patient received IV Lexiscan 0.4 mg over 15-seconds with concurrent low level exercise and then  Technetium 86m Sestamibi was injected at 30-seconds while the patient continued walking one more minute. This patient was sob with the Lexiscan injection. Quantitative spect images were obtained after a 45-minute delay. Stress ECG: There are scattered PVCs.  QPS Raw Data Images:  Normal; no motion artifact; normal heart/lung ratio. Stress Images:  Inferior and inferoapical perfusion defect Rest Images:  inferoapical perfusion defect Subtraction (SDS):  These findings are consistent with ischemia. Transient Ischemic Dilatation (Normal <1.22):  0.83 Lung/Heart Ratio (Normal <0.45):  0.26  Quantitative Gated Spect Images QGS EDV:  98 ml QGS ESV:  33 ml  Impression Exercise Capacity:  Poor exercise capacity. BP Response:  Hypertensive blood pressure response. Clinical Symptoms:  No significant symptoms noted. ECG Impression:  PVC's and trigemeny noted, but no ischemic EKG changes Comparison with Prior Nuclear Study: No previous nuclear study performed  Overall Impression:  Intermediate risk stress nuclear study with a medium-sized (Extent 13%), moderate severity partially reversible (SDS 4) inferior, inferoapical and apical lateral defect consistent with mild ischemia. Poor exercise effort and marked hypertensive response to exercise, with PVC's and ventricular trigeminy.  LV Ejection Fraction: 66%.  LV Wall Motion:  Normal Wall Motion  Pixie Casino, MD, [facility] Board Certified in Nuclear Cardiology Attending Cardiologist [place]

## 2014-09-03 ENCOUNTER — Telehealth: Payer: Self-pay | Admitting: *Deleted

## 2014-09-03 ENCOUNTER — Encounter: Payer: Self-pay | Admitting: Physician Assistant

## 2014-09-03 ENCOUNTER — Encounter: Payer: Self-pay | Admitting: *Deleted

## 2014-09-03 NOTE — Telephone Encounter (Signed)
Bradley Sawyer More Detail >>      Bradley Hector, MD      Sent: Tue September 03, 2014  3:54 PM      To: Richmond Campbell, LPN; Elam Dutch, MD              Message      Myovue abnormal needs cath before major vascular surgery to include sternotomy  Will try to arrange 10/22              Bradley Sawyer  09/02/2014 9:15 AM   Clinical Support  MRN:  [id number]   Description: Male DOB: Jan 31, 1956  Provider: Tarri Glenn Deal  Department: [facility] Nuclear Med          Diagnoses      Coronary artery disease involving native coronary artery of native heart without angina pectoris    -  Primary      ICD-9-CM: 414.01 ICD-10-CM: I25.10      Pre-operative cardiovascular examination          ICD-9-CM: V72.81 ICD-10-CM: Z01.810                 Current Vitals Most recent update: 09/02/2014  3:53 PM by Saddie Benders      BP Ht Wt BMI        122/63 5\' 11"  (1.803 m) 216 lb (97.977 kg) 30.14 kg/m2          Vitals History Recorded                 Progress Notes      Pixie Casino, MD at 09/02/2014 10:24 AM      Status: [place] [address] [employer], [place] [address] [phone]        Cardiology Nuclear Med Study   Bradley Sawyer is a 58 y.o. male     MRN : [id number]     DOB: 08/20/1956   Procedure Date: 09/02/2014   Nuclear Med Background Indication for Stress Test:  Evaluation for Ischemia and Surgical Clearance:Right Subclavian Artery Bypass with Dr. Oneida Alar History:  COPD and CAD Cardiac Risk Factors: CVD, N/O CAD   Symptoms:  Chest Pain, DOE and Palpitations     Nuclear Pre-Procedure Caffeine/Decaff Intake:  7:00pm  NPO After: 11:00pm    Lungs:  clear O2 Sat: 90-92% on room air.  IV 0.9% NS with Angio Cath:  20g   IV Site: L Hand   IV Started by:  Matilde Haymaker, RN   Chest Size (in):  46  Cup Size: n/a   Height: 5\' 11"  (1.803 m)   Weight:  216 lb (97.977 kg)    BMI:  Body mass index is 30.14 kg/(m^2).  Tech Comments:  n/a        Nuclear Med Study 1 or 2 day study: 1 day   Stress Test Type:  Treadmill/Lexiscan   Reading MD: n/a   Order Authorizing Provider:  Verlin Dike   Resting Radionuclide: Technetium 58m Sestamibi   Resting Radionuclide Dose: 11.0 mCi    Stress Radionuclide:  Technetium 49m Sestamibi   Stress Radionuclide Dose: 33.0 mCi              Stress Protocol Rest HR: 56  Stress HR: 116   Rest BP: 122/63  Stress BP: 213/64   Exercise Time (min): 3:43  METS: 5.40      Predicted Max HR: 162 bpm % Max HR: 71.6 bpm Rate Pressure Product: 24708      Dose of Adenosine (mg):  n/a  Dose of Lexiscan: 0.4 mg   Dose of Atropine (mg): n/a  Dose of Dobutamine: n/a mcg/kg/min (at max HR)   Stress Test Technologist: Perrin Maltese, EMT-P   Nuclear Technologist:  Vedia Pereyra, CNMT         Rest Procedure:  Myocardial perfusion imaging was performed at rest 45 minutes following the intravenous administration of Technetium 73m Sestamibi. Rest ECG: NSR - Normal EKG   Stress Procedure:  The patient received IV Lexiscan 0.4 mg over 15-seconds with concurrent low level exercise and then Technetium 58m Sestamibi was injected at 30-seconds while the patient continued walking one more minute. This patient was sob with the Lexiscan injection. Quantitative spect images were obtained after a 45-minute delay. Stress ECG: There are scattered PVCs.   QPS Raw Data Images:  Normal; no motion artifact; normal heart/lung ratio. Stress Images:  Inferior and inferoapical perfusion defect Rest Images:  inferoapical perfusion defect Subtraction (SDS):  These findings are consistent with ischemia. Transient Ischemic Dilatation (Normal <1.22):  0.83 Lung/Heart Ratio (Normal <0.45):  0.26   Quantitative Gated Spect Images QGS EDV:  98 ml QGS ESV:  33 ml   Impression Exercise Capacity:  Poor exercise capacity. BP Response:  Hypertensive  blood pressure response. Clinical Symptoms:  No significant symptoms noted. ECG Impression:  PVC's and trigemeny noted, but no ischemic EKG changes Comparison with Prior Nuclear Study: No previous nuclear study performed   Overall Impression:  Intermediate risk stress nuclear study with a medium-sized (Extent 13%), moderate severity partially reversible (SDS 4) inferior, inferoapical and apical lateral defect consistent with mild ischemia. Poor exercise effort and marked hypertensive response to exercise, with PVC's and ventricular trigeminy.   LV Ejection Fraction: 66%.  LV Wall Motion:  Normal Wall Motion   Pixie Casino, MD, [facility] Board Certified in Nuclear Cardiology Attending Cardiologist [place]                           Revision History       Date/Time User Action      > 09/02/2014  4:43 PM Pixie Casino, MD Sign        09/02/2014  4:14 PM Benjiman Core Young Sign at close encounter        09/02/2014  4:00 PM Sabrina Hansel Feinstein Sign at close encounter        09/02/2014 10:26 AM Evlyn Kanner, RN Sign at close encounter                        Encounter-Level Documents:            Electronic signature on 09/02/2014 9:17 AM          Electronic signature on 09/02/2014 9:18 AM             Not recorded       Ordered Facility-Administered Medications        Dose Freq Start End      regadenoson (LEXISCAN) injection SOLN 0.4 mg 0.4 mg  Once 09/02/2014 09/02/2014      Route: Intravenous      technetium sestamibi generic (CARDIOLITE) injection 11 milli Curie 11 milli Curie Once PRN 09/02/2014 09/02/2014      Route: Intravenous  technetium sestamibi generic (CARDIOLITE) injection 33 milli Curie 33 milli Curie Once PRN 09/02/2014 09/02/2014      Route: Intravenous                Administrations This Visit      regadenoson (LEXISCAN) injection SOLN 0.4 mg      Administered Action Dose Route Site Administered By Ordering Provider       09/02/2014  11:20 Given 0.4 mg Intravenous   Bradley Moselle, MD      Patient Supplied?: No           technetium sestamibi generic (CARDIOLITE) injection 11 milli Curie      Administered Action Dose Route Site Administered By Ordering Provider      09/02/2014  09:35 Contrast Given Dubach, MD      Patient Supplied?: No           technetium sestamibi generic (CARDIOLITE) injection 33 milli Curie      Administered Action Dose Route Site Administered By Ordering Provider      09/02/2014  11:20 Contrast Given [place] Kenneth C. Hilty, MD      Patient Supplied?: No                          Referring Provider      Bradley Hector, MD            Other Encounter Related Information      Allergies & Medications             Problem List             History             Patient-Entered Questionnaires                Administrations This Visit      regadenoson (LEXISCAN) injection SOLN 0.4 mg      Administered Action Dose Route Site Administered By Ordering Provider      09/02/2014  11:20 Given 0.4 mg Intravenous   Bradley Moselle, MD      Patient Supplied?: No           technetium sestamibi generic (CARDIOLITE) injection 11 milli Curie      Administered Action Dose Route Site Administered By Ordering Provider      09/02/2014  09:35 Contrast Given [place] Kenneth C. Hilty, MD      Patient Supplied?: No           technetium sestamibi generic (CARDIOLITE) injection 33 milli Curie      Administered Action Dose Route Site Administered By Ordering Provider      09/02/2014  11:20 Contrast Given [place] Kenneth C. Hilty, MD      Patient Supplied?: No                   All Flowsheet Templates (all recorded)      Encounter Vitals Flowsheet      Custom Formula Data Flowsheet      Anthropometrics  Flowsheet      Nuclear Med Study Flowsheet      Ejection Fraction Flowsheet  Referring Provider      Bradley Hector, MD             All Charges for This Encounter      Code Description Service Date Service Provider Modifiers Qty      15400867 regadenoson 0.4 MG/5ML Soln 5 mL Syringe 09/02/2014 Pixie Casino, MD   4      [id number] NM TC99M SESTAMIBI CARDIOLITE 09/02/2014 Pixie Casino, MD   2      [id number] [facility] TREADMILL PHARM STRESS 09/02/2014 Pixie Casino, MD   1      [id number] [place] Lynnette Caffey SPECT W WA 09/02/2014 Pixie Casino, MD   1                AVS Reports      No AVS Snapshots are available for this encounter.            Smoking Cessation Audit Trail        Ready to Quit Counseling Given User Date and Time      Office Visit - 07/10/2014 Not Answered Yes Marcelino Duster, [place] 07/10/2014  1:41 PM      Procedure visit - 05/28/2014 No Yes Irene Limbo, RN 05/28/2014  2:59 PM      Office Visit - 04/29/2014 No Yes June S McMurray, [place] 04/29/2014  8:48 AM      Office Visit - 04/09/2014 Yes Yes Marcelino Duster, CMA 04/09/2014  1:25 PM      Office Visit - 02/19/2014 No Yes Marcelino Duster, [place] 02/19/2014  2:04 PM        Not Answered Yes Marcelino Duster, CMA 02/19/2014  1:59 PM      Hospital Encounter - 12/18/2013 No No Valente David, RN 12/18/2013  4:33 PM      Office Visit - 12/03/2013 Yes Yes Marcelino Duster, [place] 12/03/2013  2:54 PM        Not Answered Yes Marcelino Duster, [place] 12/03/2013  2:54 PM      Office Visit - 10/05/2013 Yes Yes Malachy Mood Stur[place] 10/05/2013 10:51 AM      Office Visit - 09/21/2013 Not Answered Yes Ebbie Latus, RN 09/21/2013  9:58 AM      Office Visit - 07/17/2013 Not Answered Not Answered Lavonna Monarch Ditzler, RN 07/17/2013  2:28 PM        No Not Answered Lavonna Monarch Ditzler, RN 07/17/2013  2:28 PM      Office Visit - 03/12/2013 Yes Yes Marcelino Duster, [place] 03/12/2013 10:37 AM      Office Visit - 04/28/2012 Yes Yes Janell Quiet,  MD 04/28/2012 11:39 AM        Yes Not Answered Trey Sailors Herbin, RN 04/28/2012  9:39 AM              Diabetic Foot Exam      No data filed            Diabetic Foot Form - Detailed      No data filed            Diabetic Foot Exam - Simple      No data filed                 Guarantor Account: Devlyn, Retter ([id number])      Relation to Patient: Account Type Service Area      Self Personal/Family [place]  Coverages for This Account     Coverage ID Payor Plan Insurance ID      [id number] [place] [id number] N              Guarantor Account: Daelen, [place] ([id number])      Relation to Patient: Account Type Service Area      Self Personal/Family [place] for This Account     Coverage ID Payor Plan Insurance ID      [id number] MEDICAID [place] [id number] N              Guarantor Account: Tripton, Ned ([id number])      Relation to Patient: Account Type Service Area      Self Personal/Family [facility] [place] [facility]                 Guarantor Account: Emerick, [place] ([id number])      Relation to Patient: Account Type Service Area      Self Personal/Family [facility]            PT  AWARE  CATH SET UP  FOR  09-12-14  WITH DR NISHAN./CY

## 2014-09-04 ENCOUNTER — Encounter (HOSPITAL_COMMUNITY): Payer: Self-pay | Admitting: Pharmacy Technician

## 2014-09-04 NOTE — Telephone Encounter (Signed)
Spoke with [facility]- canceled appt with Dr Servando Snare for 09/05/14- they are working to get him rescheduled after 09/12/14- their office will call me back with appointment information. Patient is aware that his appt has been canceled, dpm

## 2014-09-04 NOTE — Telephone Encounter (Signed)
Message copied by Gena Fray on Wed Sep 04, 2014  3:28 PM ------      Message from: Denman George      Created: Wed Sep 04, 2014 11:45 AM      Regarding: FW: reschedule office appt?       Can you reschedule appts. with Both Dr. Servando Snare and Dr. Oneida Alar after Cardiac Cath. that is scheduled on 10/22.  See notes below.       ----- Message -----         From: Elam Dutch, MD         Sent: 09/04/2014  11:40 AM           To: Lynetta Mare Pullins, RN      Subject: RE: reschedule office appt?                              See him after cath.  He needs to see Servando Snare after cath too.            Charles      ----- Message -----         From: Sherrye Payor, RN         Sent: 09/04/2014  11:06 AM           To: Elam Dutch, MD      Subject: reschedule office appt?                                  He had an abnormal ST 10/12.  He is scheduled for a Cardiac Cath on 10/22.  Should we reschedule the appt. with you on 10/21, or do you prefer to see him anyway, preceding the card. cath?              ------

## 2014-09-05 ENCOUNTER — Encounter: Payer: Medicaid Other | Admitting: Cardiothoracic Surgery

## 2014-09-09 ENCOUNTER — Ambulatory Visit: Payer: Medicaid Other | Admitting: Physician Assistant

## 2014-09-10 ENCOUNTER — Telehealth: Payer: Self-pay | Admitting: *Deleted

## 2014-09-10 ENCOUNTER — Other Ambulatory Visit (INDEPENDENT_AMBULATORY_CARE_PROVIDER_SITE_OTHER): Payer: Medicaid Other | Admitting: *Deleted

## 2014-09-10 ENCOUNTER — Other Ambulatory Visit: Payer: Self-pay | Admitting: *Deleted

## 2014-09-10 ENCOUNTER — Other Ambulatory Visit: Payer: Self-pay | Admitting: Internal Medicine

## 2014-09-10 DIAGNOSIS — Z01810 Encounter for preprocedural cardiovascular examination: Secondary | ICD-10-CM

## 2014-09-10 DIAGNOSIS — F17200 Nicotine dependence, unspecified, uncomplicated: Secondary | ICD-10-CM

## 2014-09-10 DIAGNOSIS — I2510 Atherosclerotic heart disease of native coronary artery without angina pectoris: Secondary | ICD-10-CM

## 2014-09-10 LAB — CBC WITH DIFFERENTIAL/PLATELET
BASOS ABS: 0 10*3/uL (ref 0.0–0.1)
Basophils Relative: 0.3 % (ref 0.0–3.0)
EOS ABS: 0.1 10*3/uL (ref 0.0–0.7)
Eosinophils Relative: 1.1 % (ref 0.0–5.0)
HEMATOCRIT: 39.4 % (ref 39.0–52.0)
Hemoglobin: 13.1 g/dL (ref 13.0–17.0)
LYMPHS ABS: 3.4 10*3/uL (ref 0.7–4.0)
Lymphocytes Relative: 31.3 % (ref 12.0–46.0)
MCHC: 33.2 g/dL (ref 30.0–36.0)
MCV: 94.3 fl (ref 78.0–100.0)
Monocytes Absolute: 0.8 10*3/uL (ref 0.1–1.0)
Monocytes Relative: 7.8 % (ref 3.0–12.0)
Neutro Abs: 6.4 10*3/uL (ref 1.4–7.7)
Neutrophils Relative %: 59.5 % (ref 43.0–77.0)
PLATELETS: 230 10*3/uL (ref 150.0–400.0)
RBC: 4.18 Mil/uL — ABNORMAL LOW (ref 4.22–5.81)
RDW: 12.9 % (ref 11.5–15.5)
WBC: 10.8 10*3/uL — ABNORMAL HIGH (ref 4.0–10.5)

## 2014-09-10 LAB — BASIC METABOLIC PANEL
BUN: 22 mg/dL (ref 6–23)
CALCIUM: 9.4 mg/dL (ref 8.4–10.5)
CO2: 23 mEq/L (ref 19–32)
Chloride: 103 mEq/L (ref 96–112)
Creatinine, Ser: 1.1 mg/dL (ref 0.4–1.5)
GFR: 70.8 mL/min (ref >60.00)
GLUCOSE: 105 mg/dL — AB (ref 70–99)
POTASSIUM: 3.8 meq/L (ref 3.5–5.1)
Sodium: 140 mEq/L (ref 135–145)

## 2014-09-10 LAB — PROTIME-INR
INR: 1 ratio (ref 0.8–1.0)
Prothrombin Time: 11.3 s (ref 9.6–13.1)

## 2014-09-10 MED ORDER — VARENICLINE TARTRATE 0.5 MG PO TABS: ORAL_TABLET | ORAL | Status: DC

## 2014-09-10 NOTE — Telephone Encounter (Signed)
I can't promise him he can stay it all depends on what we find an if there are any complications He needs to get this done so he can have his surgery

## 2014-09-10 NOTE — Telephone Encounter (Signed)
Call from pt - States he received Chantix rx (9/21) for the first several weeks so now he needs week 5 - 8 .Thanks

## 2014-09-10 NOTE — Telephone Encounter (Signed)
Pt walked in and wanted to talk w/nurse regarding cath scheduled for 10/22.  Spoke w/him.  He states he is currently living with his son who lives   1 1/2 hrs away.  He does not have anyone that can come stay with him at hospital day of procedure.  He wants to know if he can stay overnight so son could pick him up on Friday morning.  Advised that if had just a cath without intervention would not stay overnight. He is requesting that arrangements be made for him to stay due to length of drive and not being able to give his son a specific time to pick him up at [facility] Stay on Thursday.  Advised would send Dr. Johnsie Cancel a message to make him aware of the situation.

## 2014-09-11 ENCOUNTER — Other Ambulatory Visit: Payer: Self-pay | Admitting: Cardiovascular Disease

## 2014-09-11 ENCOUNTER — Encounter: Payer: Medicaid Other | Admitting: Vascular Surgery

## 2014-09-11 DIAGNOSIS — I2583 Coronary atherosclerosis due to lipid rich plaque: Principal | ICD-10-CM

## 2014-09-11 DIAGNOSIS — I2510 Atherosclerotic heart disease of native coronary artery without angina pectoris: Secondary | ICD-10-CM

## 2014-09-12 ENCOUNTER — Encounter (HOSPITAL_COMMUNITY)
Admission: RE | Disposition: A | Discharge status: Home / Self Care | Payer: Self-pay | Source: Ambulatory Visit | Attending: Cardiovascular Disease

## 2014-09-12 ENCOUNTER — Ambulatory Visit (HOSPITAL_COMMUNITY)
Admission: RE | Admit: 2014-09-12 | Discharge: 2014-09-12 | Disposition: A | Discharge status: Home / Self Care | Payer: Medicaid Other | Source: Ambulatory Visit | Attending: Cardiovascular Disease | Admitting: Cardiovascular Disease

## 2014-09-12 DIAGNOSIS — Z7902 Long term (current) use of antithrombotics/antiplatelets: Secondary | ICD-10-CM | POA: Diagnosis not present

## 2014-09-12 DIAGNOSIS — Z7982 Long term (current) use of aspirin: Secondary | ICD-10-CM | POA: Insufficient documentation

## 2014-09-12 DIAGNOSIS — I739 Peripheral vascular disease, unspecified: Secondary | ICD-10-CM | POA: Diagnosis not present

## 2014-09-12 DIAGNOSIS — K219 Gastro-esophageal reflux disease without esophagitis: Secondary | ICD-10-CM | POA: Diagnosis not present

## 2014-09-12 DIAGNOSIS — Z720 Tobacco use: Secondary | ICD-10-CM | POA: Diagnosis not present

## 2014-09-12 DIAGNOSIS — E785 Hyperlipidemia, unspecified: Secondary | ICD-10-CM | POA: Insufficient documentation

## 2014-09-12 DIAGNOSIS — I2583 Coronary atherosclerosis due to lipid rich plaque: Secondary | ICD-10-CM

## 2014-09-12 DIAGNOSIS — Z8673 Personal history of transient ischemic attack (TIA), and cerebral infarction without residual deficits: Secondary | ICD-10-CM | POA: Diagnosis not present

## 2014-09-12 DIAGNOSIS — I10 Essential (primary) hypertension: Secondary | ICD-10-CM | POA: Diagnosis not present

## 2014-09-12 DIAGNOSIS — M1990 Unspecified osteoarthritis, unspecified site: Secondary | ICD-10-CM | POA: Insufficient documentation

## 2014-09-12 DIAGNOSIS — I2510 Atherosclerotic heart disease of native coronary artery without angina pectoris: Secondary | ICD-10-CM

## 2014-09-12 DIAGNOSIS — Z79891 Long term (current) use of opiate analgesic: Secondary | ICD-10-CM | POA: Diagnosis not present

## 2014-09-12 DIAGNOSIS — J449 Chronic obstructive pulmonary disease, unspecified: Secondary | ICD-10-CM | POA: Insufficient documentation

## 2014-09-12 DIAGNOSIS — Z01810 Encounter for preprocedural cardiovascular examination: Secondary | ICD-10-CM | POA: Diagnosis present

## 2014-09-12 DIAGNOSIS — R9430 Abnormal result of cardiovascular function study, unspecified: Secondary | ICD-10-CM

## 2014-09-12 HISTORY — PX: LEFT HEART CATHETERIZATION WITH CORONARY ANGIOGRAM: SHX5451

## 2014-09-12 SURGERY — LEFT HEART CATHETERIZATION WITH CORONARY ANGIOGRAM
Anesthesia: LOCAL

## 2014-09-12 MED ORDER — SODIUM CHLORIDE 0.9 % IJ SOLN: 3.0000 mL | Freq: Two times a day (BID) | INTRAMUSCULAR | Status: DC

## 2014-09-12 MED ORDER — DIAZEPAM 2 MG PO TABS: 2.0000 mg | ORAL_TABLET | Freq: Four times a day (QID) | ORAL | Status: DC | PRN

## 2014-09-12 MED ORDER — NITROGLYCERIN 1 MG/10 ML FOR IR/CATH LAB
INTRA_ARTERIAL | Status: AC
  Filled 2014-09-12: qty 10

## 2014-09-12 MED ORDER — HEPARIN (PORCINE) IN NACL 2-0.9 UNIT/ML-% IJ SOLN
INTRAMUSCULAR | Status: AC
  Filled 2014-09-12: qty 500

## 2014-09-12 MED ORDER — OXYCODONE-ACETAMINOPHEN 5-325 MG PO TABS: 1.0000 | ORAL_TABLET | ORAL | Status: DC | PRN

## 2014-09-12 MED ORDER — LIDOCAINE HCL (PF) 1 % IJ SOLN
INTRAMUSCULAR | Status: AC
  Filled 2014-09-12: qty 30

## 2014-09-12 MED ORDER — SODIUM CHLORIDE 0.9 % IJ SOLN: 3.0000 mL | INTRAMUSCULAR | Status: DC | PRN

## 2014-09-12 MED ORDER — FENTANYL CITRATE 0.05 MG/ML IJ SOLN
INTRAMUSCULAR | Status: AC
  Filled 2014-09-12: qty 2

## 2014-09-12 MED ORDER — HEPARIN (PORCINE) IN NACL 2-0.9 UNIT/ML-% IJ SOLN
INTRAMUSCULAR | Status: AC
  Filled 2014-09-12: qty 1000

## 2014-09-12 MED ORDER — MIDAZOLAM HCL 2 MG/2ML IJ SOLN
INTRAMUSCULAR | Status: AC
  Filled 2014-09-12: qty 2

## 2014-09-12 MED ORDER — SODIUM CHLORIDE 0.9 % IV SOLN: 250.0000 mL | INTRAVENOUS | Status: DC | PRN

## 2014-09-12 MED ORDER — SODIUM CHLORIDE 0.45 % IV SOLN: INTRAVENOUS | Status: AC

## 2014-09-12 MED ORDER — SODIUM CHLORIDE 0.9 % IV SOLN
250.0000 mL | INTRAVENOUS | Status: DC | PRN
  Administered 2014-09-12: 1000 mL via INTRAVENOUS

## 2014-09-12 MED ORDER — ASPIRIN 81 MG PO CHEW: 81.0000 mg | CHEWABLE_TABLET | ORAL | Status: DC

## 2014-09-12 NOTE — Interval H&P Note (Signed)
History and Physical Interval Note:  Patient is preop for vascular surgery.  He has a symptomatic right innominate occlusion.  This will require sternotomy.  He has an abnormal myovue with inferior wall ischemia.  Cath is indicated for pre op risk stratification.  Risks including stroke bleeding and MI discussed with patient He will be done from femoral artery due to UE vascular disease  He indicated he may need to stay over night due to lack of transportation.    09/12/2014 7:13 AM  [name]  has presented today for surgery, with the diagnosis of abnormal myoview  The various methods of treatment have been discussed with the patient and family. After consideration of risks, benefits and other options for treatment, the patient has consented to  Procedure(s): LEFT HEART CATHETERIZATION WITH CORONARY ANGIOGRAM (N/A) as a surgical intervention .  The patient's history has been reviewed, patient examined, no change in status, stable for surgery.  I have reviewed the patient's chart and labs.  Questions were answered to the patient's satisfaction.     Jenkins Rouge

## 2014-09-12 NOTE — Telephone Encounter (Addendum)
Message copied by Gena Fray on Thu Sep 12, 2014  1:08 PM ------      Message from: Gregery Na      Created: Thu Sep 12, 2014 12:51 PM      Regarding: RE: Follow Up       I have called [facility]  to try and move his appt. up with Dr. Servando Snare. Today Jolene at their office is going to speak with him and call me back. I will let you know once I hear something. I know Dr. Oneida Alar is wanting to get this done soon. Thanks for following up!      ----- Message -----         From: Gena Fray         Sent: 09/12/2014   9:38 AM           To: Gareth Morgan Dehart, RN      Subject: Follow Up                                                Colletta [place],            Did you ask Dr Oneida Alar to call [facility] to get this patients appointment moved up? I see that he is scheduled for 11/19 with Dr Servando Snare. I can schedule him to see [facility] the same day at 3:45 if we are keeping that date with [facility].            Thanks,      Hinton Dyer       ------

## 2014-09-12 NOTE — H&P (View-Only) (Signed)
Cardiology Office Note    Date:  08/28/2014   ID:  Bradley Sawyer, DOB 1956-03-16, MRN [id number]  PCP:  Albin Felling, MD  Cardiologist:  Dr. Jenkins Rouge      History of Present Illness: Bradley Sawyer is a 58 y.o. male a history of HTN, HL, COPD, tobacco abuse. He was seen in 2013 by Dr. Johnsie Cancel after a visit to the emergency room with chest pain. Cardiac CTA demonstrated nonobstructive CAD.  Patient had lumbar spine surgery in 11/2013. Postoperative course was complicated by embolic right hemispheric CVA.  The patient was recently evaluated by Dr. Oneida Alar for vascular surgery due to right subclavian stenosis.  This was found incidentally due to blood pressure discrepancy.  Patient did report lower visual field cut in the right eye at times. This was felt to possibly be related to his subclavian stenosis. Patient recently underwent arch aortogram. 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 Innominate bypass or possible innominate endarterectomy has been recommended. He returns for preoperative cardiovascular evaluation.   Patient notes that he recently started working out at the [facility] ~ 4-5 weeks ago.  He was lifting weights (30-40 lbs), walking 1 1/2 miles, swimming in the pool.  He had no chest discomfort with this.  He has chronic DOE related to COPD without significant change.  He is probably NYHA 2b.  He denies orthopnea, PND, edema.  He denies syncope.  He still has occasional "pinching" in his L chest.  He has had this for years (since he saw Dr. Johnsie Cancel) without change.  It does not occur with exertion.  He has no associated symptoms.    Studies:  - TEE (2/15):  EF 55-60%, normal wall motion, normal caliber aortic arch and descending thoracic aorta with grade 3 plaque, mild LAE, normal RVSF, no cardiac source of emboli  - Echo (1/15):  EF 55-60%, normal wall motion  - Carotid US (9/15):  Bilateral ICA <40%;  known right subclavian steal  - Cardiac CTA (11/13):  Ca Score 419, LM 0-25%, LAD 25-50%, ostial D2 0-25%, prox CFX 0-25%, OM1 0-25%, RCA 0-25%; small R lung nodules (4 mm) >> FU CT recommended 1 year (unless low risk for bronchogenic CA); severe hepatic steatosis - FU CT at [facility] in 8/14: no masses identified   Recent Labs/Images:   Recent Labs  12/22/13 0057  08/24/14 0034  NA  --   < > 141  K  --   < > 4.1  BUN  --   < > 18  CREATININE  --   < > 0.95  HGB  --   < > 12.8*  LDLCALC 52  --   --   HDL 41  --   --   < > = values in this interval not displayed.   No results found.   Wt Readings from Last 3 Encounters:  08/23/14 220 lb (99.791 kg)  08/23/14 220 lb (99.791 kg)  08/12/14 220 lb 11.2 oz (100.109 kg)     Past Medical History  Diagnosis Date  . Hyperlipidemia   . Hypertension   . GERD (gastroesophageal reflux disease)   . Tobacco abuse   . COPD (chronic obstructive pulmonary disease)     bronchitis  . Disorder of vocal cord   . Cocaine dependence     Last use 1998, attends 3 NA meetings weekly.  . Right shoulder pain     2/2 partial  rotator cuff tear, tendinopathy, mild subacromial/subdeltioid bursitis, A/C joint arthropathy per MRI done in 3/07  . Arthritis   . Anxiety   . Compression fracture     L2  . CVA (cerebral infarction)   . Fatty liver     Current Outpatient Prescriptions  Medication Sig Dispense Refill  . albuterol (PROVENTIL HFA;VENTOLIN HFA) 108 (90 BASE) MCG/ACT inhaler Inhale 2 puffs into the lungs every 6 (six) hours as needed for wheezing.  3 Inhaler  2  . amLODipine (NORVASC) 10 MG tablet Take 1 tablet (10 mg total) by mouth daily.  30 tablet  11  . aspirin EC 81 MG tablet Take 81 mg by mouth daily.      Marland Kitchen atorvastatin (LIPITOR) 20 MG tablet Take 20 mg by mouth daily.      . clopidogrel (PLAVIX) 75 MG tablet Take 1 tablet (75 mg total) by mouth daily.  30 tablet  6  . docusate sodium (COLACE) 100 MG capsule Take 100 mg by mouth 2 (two)  times daily.      . Fluticasone-Salmeterol (ADVAIR) 500-50 MCG/DOSE AEPB Inhale 1 puff into the lungs every 12 (twelve) hours.  180 each  4  . gabapentin (NEURONTIN) 400 MG capsule Take 2 capsules (800 mg total) by mouth 3 (three) times daily.  180 capsule  4  . lisinopril-hydrochlorothiazide (PRINZIDE) 20-12.5 MG per tablet Take 1 tablet by mouth daily.  30 tablet  6  . nitroGLYCERIN (NITROSTAT) 0.4 MG SL tablet Place 1 tablet (0.4 mg total) under the tongue every 5 (five) minutes as needed for chest pain.  25 tablet  8  . omega-3 acid ethyl esters (LOVAZA) 1 G capsule Take 2 capsules (2 g total) by mouth 2 (two) times daily.  90 capsule  3  . oxyCODONE-acetaminophen (PERCOCET) 7.5-325 MG per tablet Take 1 tablet by mouth every 6 (six) hours as needed for pain.      . pantoprazole (PROTONIX) 40 MG tablet Take 1 tablet (40 mg total) by mouth daily.  30 tablet  6  . polyethylene glycol (MIRALAX / GLYCOLAX) packet Take 17 g by mouth daily as needed (for constipation).      . pravastatin (PRAVACHOL) 20 MG tablet Take 1 tablet (20 mg total) by mouth daily.  30 tablet  11  . varenicline (CHANTIX) 0.5 MG tablet Days 1 to 3: 0.5 mg once daily then day 4 to 7: 0.5 mg twice daily, then day 8 and on: 1 mg twice daily for 11 weeks.  150 tablet  0   No current facility-administered medications for this visit.     Allergies:   Review of patient's allergies indicates no known allergies.   Social History:  The patient  reports that he has been smoking Cigarettes.  He has a 48 pack-year smoking history. He has never used smokeless tobacco. He reports that he uses illicit drugs (Cocaine and Marijuana). He reports that he does not drink alcohol.   Family History:  The patient's family history includes Diabetes in his brother and paternal grandmother; Heart disease in his brother and father; Muscular dystrophy in his brother and sister.   ROS:  Please see the history of present illness.      All other systems  reviewed and negative.    PHYSICAL EXAM: VS:  BP 116/69  Pulse 67  Ht 5\' 11"  (1.803 m)  Wt 221 lb (100.245 kg)  BMI 30.84 kg/m2 Well nourished, well developed, in no acute distress HEENT: normal Neck: no  JVD Cardiac:  normal S1, S2; RRR; no murmur Lungs:  Decreased breath sounds bilaterally, no wheezing, rhonchi or rales Abd: soft, nontender, no hepatomegaly Ext: no edema Skin: warm and dry Neuro:  CNs 2-12 intact, no focal abnormalities noted  EKG:  NSR, HR 67, normal axis, no ST changes      ASSESSMENT AND PLAN:  1.  Pre-operative cardiovascular examination:  He presents for presurgical cardiovascular evaluation. He is able to achieve approximately 4 METs without anginal symptoms. However, his risk factor profile is extensive. Vascular surgery is planned and this is felt to be high risk. I reviewed this with Dr. Johnsie Cancel. We will arrange ETT-Myoview for risk stratification. If this demonstrates high risk features, he will need cardiac catheterization. If this is low risk, he may proceed with his vascular surgery without further cardiac intervention.  2.  Coronary artery disease no angina: Proceed with Myoview as noted. Continue aspirin, Plavix, statin.  3.  PAD (peripheral artery disease): Followup with Dr. Oneida Alar as planned.   4.  Essential hypertension: Controlled.   5.  Hyperlipemia: Continue statin.   6.  TOBACCO ABUSE: He is trying to quit.   7.  Chronic obstructive pulmonary disease: Continue Proventil, Advair.    Disposition:    FU with  Dr. Johnsie Cancel in 3 months or sooner if his stress test is abnormal.    Signed, Richardson Dopp, PA-C, MHS 08/28/2014 8:25 AM    [facility] [address], [place], [place]  [address] Phone: [phone]; Fax: [phone]

## 2014-09-12 NOTE — Discharge Instructions (Signed)

## 2014-09-12 NOTE — Progress Notes (Signed)
Site area: right groin a 5 french sheath was removed  Site Prior to Removal:  Level 0  Pressure Applied For 20 MINUTES    Minutes Beginning at 0845  Manual:   Yes.    Patient Status During Pull:  stable  Post Pull Groin Site:  Level 0  Post Pull Instructions Given:  Yes.    Post Pull Pulses Present:  Yes.    Dressing Applied:  Yes.    Comments:  Pt remain stable during sheath pull.   Pt denies any discomfort at this time.

## 2014-09-12 NOTE — CV Procedure (Signed)
   Cardiac Catheterization Procedure Note  Name: Bradley Sawyer MRN: [id number] DOB: [birth year]  Procedure: Left Heart Cath, Selective Coronary Angiography, LV angiography  Indication:  Pre vascular surgery abnormal myovue   Procedural details: The right groin was prepped, draped, and anesthetized with 1% lidocaine. Using modified Seldinger technique, a 5 French sheath was introduced into the right femoral artery. Standard Judkins catheters were used for coronary angiography and left ventriculography. Catheter exchanges were performed over a guidewire. There were no immediate procedural complications. The patient was transferred to the post catheterization recovery area for further monitoring.  Procedural Findings: Hemodynamics:  AO 1130 60 LV  128 15    Lots of ectopy during hand injection   Coronary angiography: Coronary dominance: right  Left mainstem:  Normal  Left anterior descending (LAD):  Normal   D1: normal  D2 Normal  Left circumflex (LCx):  Normal   OM1:  Large vessel normal  Right coronary artery (RCA):  Dominant and normal   PDA: normal  PLA:  normal  Left ventriculography: Left ventricular systolic function is normal, LVEF is estimated at 55-65%, there is no significant mitral regurgitation   Final Conclusions:   No significant CAD  Ok to proceed with right innominate bypass and sternotomy with Dr Oneida Alar and Servando Snare    Recommendations:  Medical Rx  Jenkins Rouge 09/12/2014, 8:14 AM

## 2014-09-16 ENCOUNTER — Encounter: Payer: Self-pay | Admitting: Cardiothoracic Surgery

## 2014-09-16 ENCOUNTER — Institutional Professional Consult (permissible substitution) (INDEPENDENT_AMBULATORY_CARE_PROVIDER_SITE_OTHER): Payer: Medicaid Other | Admitting: Cardiothoracic Surgery

## 2014-09-16 VITALS — BP 136/80 | HR 73 | Ht 71.0 in | Wt 230.0 lb

## 2014-09-16 DIAGNOSIS — I708 Atherosclerosis of other arteries: Secondary | ICD-10-CM

## 2014-09-16 HISTORY — DX: Atherosclerosis of other arteries: I70.8

## 2014-09-16 NOTE — Patient Instructions (Addendum)
Stop  Lovaza and Mobic Stop Plavix when Dr Oneida Alar off tells you too.

## 2014-09-16 NOTE — Progress Notes (Signed)
Bradley Sawyer [address]       Bradley Sawyer,Bradley Sawyer [address]             [phone]                    [name] [place] Medical Record #[id number] Date of Birth: 26-Aug-1956  Referring: Elam Dutch, MD Primary Care: Albin Felling, MD  Chief Complaint:    Chief Complaint  Patient presents with  . NEW THORACIC    CONSULT    History of Present Illness:    Bradley Sawyer 58 y.o. male is seen in the office  who presents for evaluation of right subclavian artery stenosis. In January 2015 he had back surgery after being involved in a lawnmower accident. Just prior to discharge he was noted to have neurologic changes including decreased vision in right eye and weaknesses left side. He was admitted to the ICU and worked up for stroke. He was then later seen by Dr. fields because of absent right radial pulse. Further evaluation with arch aortogram demonstrated high-grade proximal innominate artery stenosis.  This was found incidentally on measuring the blood pressure in his right arm. The patient denies any exertional fatigue in the right arm. He does have occasional numbness in both hands. However the right is not worse than left. He states he did have a stroke in the past which Inderal his speech memory and right hand.  He fully recovered. He denies any recent symptoms of TIA or stroke. He denies any symptoms of dizziness. However, he has had several episodes recently where he had a lower field cut in his right eye. This would resolve and a few minutes. He is on aspirin and Plavix daily. He currently is a smoker but is trying to quit. He is right handed. He states that they have not been able to get a blood pressure in his right arm for several months. Other medical problems include hyperlipidemia, hypertension, tobacco abuse, COPD, chronic back pain all of which are currently stable. He has had previous cervical and lumbar fusion     Current Activity/ Functional  Status:  Patient is independent with mobility/ambulation, transfers, ADL's, IADL's.   Zubrod Score: At the time of surgery this patient's most appropriate activity status/level should be described as: []     0    Normal activity, no symptoms [x]     1    Restricted in physical strenuous activity but ambulatory, able to do out light work []     2    Ambulatory and capable of self care, unable to do work activities, up and about               >50 % of waking hours                              []     3    Only limited self care, in bed greater than 50% of waking hours []     4    Completely disabled, no self care, confined to bed or chair []     5    Moribund   Past Medical History  Diagnosis Date  . Hyperlipidemia   . Hypertension   . GERD (gastroesophageal reflux disease)   . Tobacco abuse   . COPD (chronic obstructive pulmonary disease)     bronchitis  . Disorder of vocal cord   . Cocaine  dependence     Last use 1998, attends 3 NA meetings weekly.  . Right shoulder pain     2/2 partial rotator cuff tear, tendinopathy, mild subacromial/subdeltioid bursitis, A/C joint arthropathy per MRI done in 3/07  . Arthritis   . Anxiety   . Compression fracture     L2  . CVA (cerebral infarction)   . Fatty liver   . Hx of cardiovascular stress test     ETT/Lexiscan-Myoview (10/15):  Inferior, inferoapical, apical lateral defect c/w mild ischemia, EF 66%; Intermediate Risk    Past Surgical History  Procedure Laterality Date  . Neck surgery    . Lumbar spine surgery  12/18/2013    L2     DR POOL  . Anterior lat lumbar fusion N/A 12/18/2013    Procedure: Lumbar Two Anteriorlateral Corpectomy w/ cage, interbody fusion, anterior plating, Lumbar One to Lumbar Three percutaneous pedicle screws;  Surgeon: Charlie Pitter, MD;  Location: [facility];  Service: Neurosurgery;  Laterality: N/A;  . Tee without cardioversion N/A 12/25/2013    Procedure: TRANSESOPHAGEAL ECHOCARDIOGRAM (TEE);  Surgeon: Larey Dresser, MD;  [facility]: [facility] ENDOSCOPY;  Service: Cardiovascular;  Laterality: N/A;    Family History  Problem Relation Age of Onset  . Diabetes Paternal Grandmother     1 st degree relatives  . Heart disease Father   . Diabetes Brother   . Heart disease Brother   . Muscular dystrophy Brother   . Muscular dystrophy Sister   . Heart attack Father   . Hypertension Paternal Grandmother     History   Social History  . Marital Status: Divorced    Spouse Name: N/A    Number of Children: 1  . Years of Education: N/A   Occupational History  . Disabled    Social History Main Topics  . Smoking status: Current Some Day Smoker -- 1.00 packs/day for 48 years    Types: Cigarettes  . Smokeless tobacco: Never Used     Comment: Started back x 2 weeks.  . Alcohol Use: No  . Drug Use: Yes    Special: Cocaine, Marijuana     Comment: over 3 yrs since last use  . Sexual Activity: Not on file   Other Topics Concern  . Not on file   Social History Narrative  . No narrative on file    History  Smoking status  . Current Some Day Smoker -- 1.00 packs/day for 48 years  . Types: Cigarettes  Smokeless tobacco  . Never Used    Comment: Started back x 2 weeks.    History  Alcohol Use No     No Known Allergies  Current Outpatient Prescriptions  Medication Sig Dispense Refill  . amLODipine (NORVASC) 10 MG tablet Take 1 tablet (10 mg total) by mouth daily.  30 tablet  11  . aspirin EC 81 MG tablet Take 81 mg by mouth daily.      Marland Kitchen atorvastatin (LIPITOR) 20 MG tablet Take 20 mg by mouth daily.      . clopidogrel (PLAVIX) 75 MG tablet Take 1 tablet (75 mg total) by mouth daily.  30 tablet  6  . docusate sodium (COLACE) 100 MG capsule Take 100 mg by mouth 2 (two) times daily.      Marland Kitchen FLUoxetine (PROZAC) 10 MG capsule Take 10 mg by mouth daily.      . Fluticasone-Salmeterol (ADVAIR) 500-50 MCG/DOSE AEPB Inhale 1 puff into the lungs every 12 (twelve) hours.  180 each  4  . gabapentin  (NEURONTIN) 400 MG capsule Take 2 capsules (800 mg total) by mouth 3 (three) times daily.  180 capsule  4  . lisinopril-hydrochlorothiazide (PRINZIDE) 20-12.5 MG per tablet Take 1 tablet by mouth daily.  30 tablet  6  . meloxicam (MOBIC) 15 MG tablet Take 15 mg by mouth daily.      . nicotine (NICODERM CQ - DOSED IN MG/24 HOURS) 21 mg/24hr patch Place 21 mg onto the skin daily.      Marland Kitchen omega-3 acid ethyl esters (LOVAZA) 1 G capsule Take 2 capsules (2 g total) by mouth 2 (two) times daily.  90 capsule  3  . pantoprazole (PROTONIX) 40 MG tablet Take 1 tablet (40 mg total) by mouth daily.  30 tablet  6  . polyethylene glycol (MIRALAX / GLYCOLAX) packet Take 17 g by mouth daily as needed (for constipation).      . pravastatin (PRAVACHOL) 20 MG tablet Take 1 tablet (20 mg total) by mouth daily.  30 tablet  11  . varenicline (CHANTIX) 0.5 MG tablet Weeks 5-8: Take 1 mg (2 tabs) twice daily  120 tablet  0  . albuterol (PROVENTIL HFA;VENTOLIN HFA) 108 (90 BASE) MCG/ACT inhaler Inhale 2 puffs into the lungs every 6 (six) hours as needed for wheezing.  3 Inhaler  2  . nitroGLYCERIN (NITROSTAT) 0.4 MG SL tablet Place 1 tablet (0.4 mg total) under the tongue every 5 (five) minutes as needed for chest pain.  25 tablet  8  . oxyCODONE-acetaminophen (PERCOCET) 7.5-325 MG per tablet Take 1 tablet by mouth every 6 (six) hours as needed for pain.       No current facility-administered medications for this visit.     Review of Systems:     Cardiac Review of Systems: Y or N  Chest Pain [ n   ]  Resting SOB [ n  ] Exertional SOB  [  ]  Orthopnea [  ]   Pedal Edema [   ]    Palpitations [  ] Syncope  [  ]   Presyncope [   ]  General Review of Systems: [Y] = yes [  ]=no Constitional: recent weight change [  ];  Wt loss over the last 3 months [   ] anorexia [  ]; fatigue [  ]; nausea [  ]; night sweats [  ]; fever [  ]; or chills [  ];          Dental: poor dentition[  ]; Last Dentist visit:   Eye : blurred vision [   ]; diplopia [   ]; vision changes [  ];  Amaurosis fugax[  ]; Resp: cough [ y ];  wheezing[ y ];  hemoptysis[n ]; shortness of breath[ n ]; paroxysmal nocturnal dyspnea[  ]; dyspnea on exertion[ y ]; or orthopnea[  ];  GI:  gallstones[  ], vomiting[  ];  dysphagia[  ]; melena[  ];  hematochezia [  ]; heartburn[  ];   Hx of  Colonoscopy[  ]; GU: kidney stones [  ]; hematuria[  ];   dysuria [  ];  nocturia[  ];  history of     obstruction [  ]; urinary frequency [  ]             Skin: rash, swelling[  ];, hair loss[  ];  peripheral edema[  ];  or itching[  ]; Musculosketetal: myalgias[  ];  joint  swelling[  ];  joint erythema[  ];  joint pain[  ];  back pain[chronic back and groin pain   ];  Heme/Lymph: bruising[  ];  bleeding[  ];  anemia[  ];  Neuro: TIA[ y right eye and left body ];  headaches[  ];  stroke[y  ];  vertigo[  ];  seizures[n  ];   paresthesias[  ];  difficulty walking[  ];  Psych:depression[ y ]; anxiety[ y ];  Endocrine: diabetes[ n ];  thyroid dysfunction[ n ];  Immunizations: Flu up to date [  ?]; Pneumococcal up to date [ ? ];  Other:  Physical Exam: BP 136/80  Pulse 73  Ht 5\' 11"  (1.803 m)  Wt 230 lb (104.327 kg)  BMI 32.09 kg/m2  SpO2 94%  PHYSICAL EXAMINATION:  General appearance: alert, cooperative, appears older than stated age and no distress Neurologic: intact Heart: regular rate and rhythm, S1, S2 normal, no murmur, click, rub or gallop Lungs: diminished breath sounds bibasilar Abdomen: soft, non-tender; bowel sounds normal; no masses,  no organomegaly Extremities: extremities normal, atraumatic, no cyanosis or edema and Homans sign is negative, no sign of DVT Patient has absent right radial pulse there is a high-pitched bruit of the right subclavian artery Has palpable DP and PT pulses bilaterally as large soft tissue mass in the left palm which he notes is related to a previous accident with a drill.  Diagnostic Studies & Laboratory data:     Recent  Radiology Findings:   No results found.    Recent Lab Findings: Lab Results  Component Value Date   WBC 10.8* 09/10/2014   HGB 13.1 09/10/2014   HCT 39.4 09/10/2014   PLT 230.0 09/10/2014   GLUCOSE 105* 09/10/2014   CHOL 121 12/22/2013   TRIG 142 12/22/2013   HDL 41 12/22/2013   LDLDIRECT 109* 03/16/2012   LDLCALC 52 12/22/2013   ALT 37 03/12/2013   AST 28 03/12/2013   NA 140 09/10/2014   K 3.8 09/10/2014   CL 103 09/10/2014   CREATININE 1.1 09/10/2014   BUN 22 09/10/2014   CO2 23 09/10/2014   TSH 1.095 03/12/2013   INR 1.0 09/10/2014   HGBA1C 5.9* 12/21/2013      Assessment / Plan:   I reviewed the patient's recent arch aortogram and agree with Dr. Oneida Alar assessment proceeding with innominate to subclavian/carotid bypass because of the high-grade innominate stenosis, this is likely will require at least partial sternotomy to have adequate proximal control. I have discussed this in detail with the patient. In addition I discussed with him the risks and options of such surgeries particularly related to stroke and sternal wound infection. I've encouraged him to stop smoking. He will stop his Lovaza  and Mobic now and will stop his Plavix prior to surgery when instructed to do so by  Dr. Oneida Alar. Recent cardiology workup including cardiac catheterization showed no need for coronary artery bypass grafting. We will plan to coordinate with Dr. fields timing of surgery.    Active Ambulatory Problems    Diagnosis Date Noted  . Hyperlipemia 09/20/2006  . TOBACCO ABUSE 09/20/2006  . Essential hypertension 09/20/2006  . VOCAL CORD DISORDER 09/20/2006  . COPD (chronic obstructive pulmonary disease) 12/24/2009  . GERD 09/20/2006  . SHOULDER PAIN, RIGHT 12/13/2006  . History of colon polyps 05/18/2012  . Compression fracture of L2, with back and right groin pain 07/18/2013  . Hematuria 08/06/2013  . 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  Unequal blood pressure in  upper extremities 04/10/2014  . CVA (cerebral infarction) 04/10/2014  . CAD (coronary artery disease) 07/10/2014  . Occlusion and stenosis of carotid artery without mention of cerebral infarction 07/25/2014  . Subclavian steal syndrome 07/25/2014  . Contusion of finger with damage to nail 08/12/2014  . Preventative health care 08/12/2014  . PAD (peripheral artery disease) 08/23/2014  . Pre-operative cardiovascular examination 08/28/2014   Resolved Ambulatory Problems    Diagnosis Date Noted  . DEPENDENCE, COCAINE, UNSPECIFIED 09/20/2006  . Back pain, lumbosacral 03/17/2012  . Lumbar radiculopathy 10/16/2013  . Lumbar compression fracture 12/18/2013   Past Medical History  Diagnosis Date  . Hyperlipidemia   . Hypertension   . GERD (gastroesophageal reflux disease)   . Tobacco abuse   . Disorder of vocal cord   . Cocaine dependence   . Right shoulder pain   . Arthritis   . Anxiety   . Compression fracture   . Fatty liver   . Hx of cardiovascular stress test      I spent 55 minutes counseling the patient face to face. The total time spent in the appointment was 80 minutes.  Grace Isaac MD      [place] [place] [address] Office [phone]   Beeper [phone]  09/16/2014 2:48 PM

## 2014-09-17 NOTE — Telephone Encounter (Signed)
Notified nurse

## 2014-09-18 ENCOUNTER — Encounter: Payer: Self-pay | Admitting: Vascular Surgery

## 2014-09-19 ENCOUNTER — Ambulatory Visit (INDEPENDENT_AMBULATORY_CARE_PROVIDER_SITE_OTHER): Payer: Medicaid Other | Admitting: Vascular Surgery

## 2014-09-19 ENCOUNTER — Other Ambulatory Visit: Payer: Self-pay

## 2014-09-19 ENCOUNTER — Encounter: Payer: Self-pay | Admitting: Vascular Surgery

## 2014-09-19 VITALS — BP 144/75 | HR 67 | Resp 16 | Ht 70.5 in | Wt 225.9 lb

## 2014-09-19 DIAGNOSIS — I771 Stricture of artery: Secondary | ICD-10-CM | POA: Insufficient documentation

## 2014-09-19 NOTE — Progress Notes (Signed)
[facility] [place] HISTORY AND PHYSICAL    History of Present Illness:  Patient is a 58 y.o. year old male who returns for follow-up today for now known innominate artery stenosis.. This was found incidentally on measuring the blood pressure in his right arm. The patient denies any exertional fatigue in the right arm. He does have occasional numbness in both hands. However the right is not worse than left. He states he did have a stroke in the past which affected his speech memory and right hand. This was after a back operation in February of 2015. He fully recovered. He denies any recent symptoms of TIA or stroke. He denies any symptoms of dizziness. However, he has had several episodes recently where he had a lower field cut in his right eye. This would resolve and a few minutes. He is on Plavix. He currently is a smoker but is trying to quit. He is right handed. He states that they have not been able to get a blood pressure in his right arm for several months.  Other medical problems include hyperlipidemia, hypertension, tobacco abuse, COPD, chronic back pain all of which are currently stable. He has had previous cervical and lumbar fusion. Recent cardiac catheterization showed no significant flow-limiting coronary lesions.    Past Medical History   Diagnosis  Date   .  Hyperlipidemia     .  Hypertension     .  GERD (gastroesophageal reflux disease)     .  Tobacco abuse     .  COPD (chronic obstructive pulmonary disease)         bronchitis   .  Disorder of vocal cord     .  Cocaine dependence         Last use 1998, attends 3 NA meetings weekly.   .  Right shoulder pain         2/2 partial rotator cuff tear, tendinopathy, mild subacromial/subdeltioid bursitis, A/C joint arthropathy per MRI done in 3/07   .  Arthritis     .  Anxiety     .  Compression fracture         L2   .  CVA (cerebral infarction)     .  Fatty liver         Past Surgical History   Procedure   Laterality  Date   .  Neck surgery       .  Lumbar spine surgery    12/18/2013       L2     DR POOL   .  Anterior lat lumbar fusion  N/A  12/18/2013       Procedure: Lumbar Two Anteriorlateral Corpectomy w/ cage, interbody fusion, anterior plating, Lumbar One to Lumbar Three percutaneous pedicle screws;  Surgeon: Charlie Pitter, MD;  Location: [facility];  Service: Neurosurgery;  Laterality: N/A;   .  Tee without cardioversion  N/A  12/25/2013       Procedure: TRANSESOPHAGEAL ECHOCARDIOGRAM (TEE);  Surgeon: Larey Dresser, MD;  Location: [facility] ENDOSCOPY;  Service: Cardiovascular;  Laterality: N/A;     Social History History   Substance Use Topics   .  Smoking status:  Current Every Day Smoker -- 1.00 packs/day for 48 years       Types:  Cigarettes   .  Smokeless tobacco:  Never Used         Comment: Started back x 2 weeks.   .  Alcohol Use:  No     Family History Family History   Problem  Relation  Age of Onset   .  Diabetes  Paternal Grandmother         1 st degree relatives   .  Heart disease  Father     .  Diabetes  Brother     .  Heart disease  Brother     .  Muscular dystrophy  Brother     .  Muscular dystrophy  Sister       Allergies  No Known Allergies     Current Outpatient Prescriptions   Medication  Sig  Dispense  Refill   .  albuterol (PROVENTIL HFA;VENTOLIN HFA) 108 (90 BASE) MCG/ACT inhaler  Inhale 2 puffs into the lungs every 6 (six) hours as needed for wheezing.   3 Inhaler   4   .  amLODipine (NORVASC) 10 MG tablet  Take 1 tablet (10 mg total) by mouth daily.   30 tablet   11   .  aspirin EC 325 MG EC tablet  Take 1 tablet (325 mg total) by mouth daily.   30 tablet   0   .  clopidogrel (PLAVIX) 75 MG tablet  Take 1 tablet (75 mg total) by mouth daily.   30 tablet   6   .  diazepam (VALIUM) 5 MG tablet  Take 1-2 tablets (5-10 mg total) by mouth every 6 (six) hours as needed for muscle spasms.   60 tablet   0   .  FLUoxetine (PROZAC) 10 MG capsule  Take 1 capsule  (10 mg total) by mouth daily.   90 capsule   4   .  Fluticasone-Salmeterol (ADVAIR) 500-50 MCG/DOSE AEPB  Inhale 1 puff into the lungs every 12 (twelve) hours.   60 each   4   .  gabapentin (NEURONTIN) 400 MG capsule  Take 2 capsules (800 mg total) by mouth 3 (three) times daily.   180 capsule   4   .  lisinopril-hydrochlorothiazide (PRINZIDE) 20-12.5 MG per tablet  Take 1 tablet by mouth daily.   30 tablet   6   .  meloxicam (MOBIC) 15 MG tablet  Take 1 tablet (15 mg total) by mouth daily.   30 tablet   2   .  nitroGLYCERIN (NITROSTAT) 0.4 MG SL tablet  Place 1 tablet (0.4 mg total) under the tongue every 5 (five) minutes as needed for chest pain.   25 tablet   8   .  omega-3 acid ethyl esters (LOVAZA) 1 G capsule  Take 2 capsules (2 g total) by mouth 2 (two) times daily.   90 capsule   3   .  oxyCODONE-acetaminophen (ROXICET) 5-325 MG per tablet  Take 1-2 tablets by mouth every 6 (six) hours as needed.   180 tablet   0   .  pantoprazole (PROTONIX) 40 MG tablet  Take 1 tablet (40 mg total) by mouth daily.   30 tablet   6   .  pravastatin (PRAVACHOL) 20 MG tablet  Take 1 tablet (20 mg total) by mouth daily.   30 tablet   11      No current facility-administered medications for this visit.     ROS:    General:  No weight loss, Fever, chills  HEENT: No recent headaches, no nasal bleeding, no visual changes, no sore throat  Neurologic: No dizziness, blackouts, seizures. No recent symptoms of stroke or mini- stroke. No recent episodes  of slurred speech, or temporary blindness.  Cardiac: No recent episodes of chest pain/pressure, no shortness of breath at rest.  + shortness of breath with exertion.  Denies history of atrial fibrillation or irregular heartbeat  Vascular: No history of rest pain in feet.  No history of claudication.  No history of non-healing ulcer, No history of DVT    Pulmonary: No home oxygen, no productive cough, no hemoptysis,  + asthma or wheezing  Musculoskeletal:  [ x]  Arthritis, [x ] Low back pain,  [x ] Joint pain  Hematologic:No history of hypercoagulable state.  No history of easy bleeding.  No history of anemia  Gastrointestinal: No hematochezia or melena,  No gastroesophageal reflux, no trouble swallowing  Urinary: [ ]  chronic Kidney disease, [ ]  on HD - [ ]  MWF or [ ]  TTHS, [ ]  Burning with urination, [ ]  Frequent urination, [ ]  Difficulty urinating;    Skin: No rashes  Psychological: + history of anxiety,  No history of depression   Physical Examination    Filed Vitals:   09/19/14 1244  BP: 144/75  Pulse: 67  Resp: 16  Height: 5' 10.5" (1.791 m)  Weight: 225 lb 14.4 oz (102.468 kg)    General:  Alert and oriented, no acute distress HEENT: Normal Neck: 1-2+ right carotid pulse 2+ left carotid pulse bilateral carotid bruits, well-healed right transverse neck scar from previous fusion   Pulmonary: Coarse breath sounds at bases bilaterally Cardiac: Regular Rate and Rhythm without murmur Abdomen: Soft, non-tender, non-distended, no mass Skin: No rash Extremity Pulses:  2+ radial, brachial, femoral, dorsalis pedis, posterior tibial pulses bilaterally Musculoskeletal: No deformity or edema     Neurologic: Upper and lower extremity motor 5/5 and symmetric  DATA:  Carotid duplex scan dated 07/25/2014 done in our office today. I reviewed and interpreted this study. This shows bilateral less than 40% internal carotid artery stenosis. Right brachial pressure 80 left brachial pressure 138 right carotid system velocities decreased suggestive of right common carotid stenosis.  Arteriogram showed high-grade exophytic calcified stenosis in the mid innominate with retrograde filling of the right subclavian via the vertebral no significant carotid bifurcation stenosis bilaterally   ASSESSMENT:  currently asymptomatic innominate stenosis but most likely the source of his stroke several months ago.  PLAN:  aorto to innominate bypass or possible  endarterectomy or possible subclavian bypass. We will coordinate this with Dr. Servando Snare schedule as he will assist [place] in obtaining access to the chest. Risks benefits possible complications and procedure details were explained to the patient today including not limited to bleeding infection stroke risk of 2-5%. We will need to stop his Plavix 7-10 days prior to the procedure. He will remain on aspirin postoperatively.  I also contacted his pain management clinic today at his request. Apparently had refused to continue his pain medications thinking that we would do that now that he is being considered for operation. I discussed the patient that we would manage his pain postoperatively but that we would not provide him pain medication for his chronic back pain. I also left a message with his pain clinic discussing this.   Ruta Hinds, MD [facility] [place] Office: [phone] Pager: [phone]

## 2014-09-24 ENCOUNTER — Encounter: Payer: Medicaid Other | Admitting: Internal Medicine

## 2014-10-03 ENCOUNTER — Encounter: Payer: Medicaid Other | Admitting: Cardiothoracic Surgery

## 2014-10-03 NOTE — Pre-Procedure Instructions (Addendum)
Bradley Sawyer  10/03/2014   Your procedure is scheduled on:  Tuesday, November 24.  Report to [facility] Admitting at 5:30 AM.  Call this number if you have problems the morning of surgery: [phone]   Remember:   Do not eat food or drink liquids after midnight Monday, November 23.   Take these medicines the morning of surgery with A SIP OF WATER: amLODipine (NORVASC),  FLUoxetine (PROZAC), gabapentin (NEURONTIN), pantoprazole (PROTONIX).    Use Fluticasone-Salmeterol (ADVAIR) inhaler.                   Take if needed: oxyCODONE-acetaminophen (PERCOCET), nitroGLYCERIN (NITROSTAT).  May use Albuterol inhaler if needed, bring inhaler to the hospital with you.                Stop plavix 10-04-2014               On Tuesday, November 17 stop taking: meloxicam (MOBIC), omega-3 acid ethyl esters (LOVAZA).   Do not wear jewelry, make-up or nail polish.  Do not wear lotions, powders, or perfumes.    Men may shave face and neck.  Do not bring valuables to the hospital.            [facility] is not responsible for any belongings or valuables.               Contacts, dentures or bridgework may not be worn into surgery.  Leave suitcase in the car. After surgery it may be brought to your room.  For patients admitted to the hospital, discharge time is determined by your  treatment team.             Special Instructions: Review  [facility] - Preparing For Surgery.   Please read over the following fact sheets that you were given: Pain Booklet, Coughing and Deep Breathing, Blood Transfusion Information and Surgical Site Infection Prevention

## 2014-10-04 ENCOUNTER — Encounter (HOSPITAL_COMMUNITY): Payer: Self-pay

## 2014-10-04 ENCOUNTER — Encounter (HOSPITAL_COMMUNITY)
Admission: RE | Admit: 2014-10-04 | Discharge: 2014-10-04 | Disposition: A | Discharge status: Home / Self Care | Payer: Medicaid Other | Source: Ambulatory Visit | Attending: Vascular Surgery | Admitting: Vascular Surgery

## 2014-10-04 DIAGNOSIS — Z01812 Encounter for preprocedural laboratory examination: Secondary | ICD-10-CM | POA: Insufficient documentation

## 2014-10-04 LAB — URINALYSIS, ROUTINE W REFLEX MICROSCOPIC
BILIRUBIN URINE: NEGATIVE
Glucose, UA: NEGATIVE mg/dL
KETONES UR: NEGATIVE mg/dL
Leukocytes, UA: NEGATIVE
Nitrite: NEGATIVE
PROTEIN: NEGATIVE mg/dL
Specific Gravity, Urine: 1.012 (ref 1.005–1.030)
UROBILINOGEN UA: 0.2 mg/dL (ref 0.0–1.0)
pH: 5.5 (ref 5.0–8.0)

## 2014-10-04 LAB — COMPREHENSIVE METABOLIC PANEL
ALBUMIN: 3.4 g/dL — AB (ref 3.5–5.2)
ALT: 20 U/L (ref 0–53)
ANION GAP: 17 — AB (ref 5–15)
AST: 18 U/L (ref 0–37)
Alkaline Phosphatase: 90 U/L (ref 39–117)
BILIRUBIN TOTAL: 0.3 mg/dL (ref 0.3–1.2)
BUN: 9 mg/dL (ref 6–23)
CO2: 22 mEq/L (ref 19–32)
CREATININE: 0.76 mg/dL (ref 0.50–1.35)
Calcium: 9.3 mg/dL (ref 8.4–10.5)
Chloride: 102 mEq/L (ref 96–112)
GFR calc Af Amer: >90 mL/min (ref >90)
GFR calc non Af Amer: >90 mL/min (ref >90)
Glucose, Bld: 119 mg/dL — ABNORMAL HIGH (ref 70–99)
Potassium: 3.4 mEq/L — ABNORMAL LOW (ref 3.7–5.3)
Sodium: 141 mEq/L (ref 137–147)
TOTAL PROTEIN: 7.5 g/dL (ref 6.0–8.3)

## 2014-10-04 LAB — CBC
HCT: 37.1 % — ABNORMAL LOW (ref 39.0–52.0)
Hemoglobin: 12.4 g/dL — ABNORMAL LOW (ref 13.0–17.0)
MCH: 30.2 pg (ref 26.0–34.0)
MCHC: 33.4 g/dL (ref 30.0–36.0)
MCV: 90.5 fL (ref 78.0–100.0)
PLATELETS: 213 10*3/uL (ref 150–400)
RBC: 4.1 MIL/uL — ABNORMAL LOW (ref 4.22–5.81)
RDW: 12.4 % (ref 11.5–15.5)
WBC: 10.2 10*3/uL (ref 4.0–10.5)

## 2014-10-04 LAB — BLOOD GAS, ARTERIAL
ACID-BASE EXCESS: 1.4 mmol/L (ref 0.0–2.0)
Bicarbonate: 25.4 mEq/L — ABNORMAL HIGH (ref 20.0–24.0)
Drawn by: 42180
FIO2: 0.21 %
O2 Saturation: 92.6 %
PATIENT TEMPERATURE: 98.6
PCO2 ART: 39.8 mmHg (ref 35.0–45.0)
TCO2: 26.6 mmol/L (ref 0–100)
pH, Arterial: 7.421 (ref 7.350–7.450)
pO2, Arterial: 66.2 mmHg — ABNORMAL LOW (ref 80.0–100.0)

## 2014-10-04 LAB — PROTIME-INR
INR: 1.03 (ref 0.00–1.49)
PROTHROMBIN TIME: 13.6 s (ref 11.6–15.2)

## 2014-10-04 LAB — URINE MICROSCOPIC-ADD ON

## 2014-10-04 LAB — PREPARE RBC (CROSSMATCH)

## 2014-10-04 LAB — APTT: aPTT: 31 seconds (ref 24–37)

## 2014-10-04 LAB — SURGICAL PCR SCREEN
MRSA, PCR: NEGATIVE
Staphylococcus aureus: NEGATIVE

## 2014-10-10 ENCOUNTER — Encounter: Payer: Medicaid Other | Admitting: Cardiothoracic Surgery

## 2014-10-10 ENCOUNTER — Ambulatory Visit: Payer: Medicaid Other | Admitting: Vascular Surgery

## 2014-10-11 ENCOUNTER — Other Ambulatory Visit: Payer: Self-pay | Admitting: Internal Medicine

## 2014-10-14 MED ORDER — DEXTROSE 5 % IV SOLN
1.5000 g | INTRAVENOUS | Status: AC
  Administered 2014-10-15 (×2): 1.5 g via INTRAVENOUS
  Filled 2014-10-14: qty 1.5

## 2014-10-15 ENCOUNTER — Inpatient Hospital Stay (HOSPITAL_COMMUNITY): Payer: Medicaid Other

## 2014-10-15 ENCOUNTER — Inpatient Hospital Stay (HOSPITAL_COMMUNITY): Payer: Medicaid Other | Admitting: Certified Registered"

## 2014-10-15 ENCOUNTER — Encounter (HOSPITAL_COMMUNITY)
Admission: RE | Disposition: A | Discharge status: Home Health | Payer: Medicaid Other | Source: Ambulatory Visit | Attending: Vascular Surgery

## 2014-10-15 ENCOUNTER — Inpatient Hospital Stay (HOSPITAL_COMMUNITY)
Admission: RE | Admit: 2014-10-15 | Discharge: 2014-10-20 | DRG: 038 | Disposition: A | Discharge status: Home Health | Payer: Medicaid Other | Source: Ambulatory Visit | Attending: Vascular Surgery | Admitting: Vascular Surgery

## 2014-10-15 DIAGNOSIS — J939 Pneumothorax, unspecified: Secondary | ICD-10-CM

## 2014-10-15 DIAGNOSIS — E785 Hyperlipidemia, unspecified: Secondary | ICD-10-CM | POA: Diagnosis present

## 2014-10-15 DIAGNOSIS — Z8673 Personal history of transient ischemic attack (TIA), and cerebral infarction without residual deficits: Secondary | ICD-10-CM

## 2014-10-15 DIAGNOSIS — I10 Essential (primary) hypertension: Secondary | ICD-10-CM | POA: Diagnosis present

## 2014-10-15 DIAGNOSIS — J9811 Atelectasis: Secondary | ICD-10-CM | POA: Diagnosis not present

## 2014-10-15 DIAGNOSIS — D72829 Elevated white blood cell count, unspecified: Secondary | ICD-10-CM | POA: Diagnosis not present

## 2014-10-15 DIAGNOSIS — D62 Acute posthemorrhagic anemia: Secondary | ICD-10-CM | POA: Diagnosis not present

## 2014-10-15 DIAGNOSIS — K219 Gastro-esophageal reflux disease without esophagitis: Secondary | ICD-10-CM | POA: Diagnosis present

## 2014-10-15 DIAGNOSIS — Z7902 Long term (current) use of antithrombotics/antiplatelets: Secondary | ICD-10-CM | POA: Diagnosis not present

## 2014-10-15 DIAGNOSIS — J9383 Other pneumothorax: Secondary | ICD-10-CM | POA: Diagnosis not present

## 2014-10-15 DIAGNOSIS — J449 Chronic obstructive pulmonary disease, unspecified: Secondary | ICD-10-CM | POA: Diagnosis present

## 2014-10-15 DIAGNOSIS — F329 Major depressive disorder, single episode, unspecified: Secondary | ICD-10-CM | POA: Diagnosis present

## 2014-10-15 DIAGNOSIS — I708 Atherosclerosis of other arteries: Secondary | ICD-10-CM

## 2014-10-15 DIAGNOSIS — M1990 Unspecified osteoarthritis, unspecified site: Secondary | ICD-10-CM | POA: Diagnosis present

## 2014-10-15 DIAGNOSIS — I771 Stricture of artery: Secondary | ICD-10-CM | POA: Diagnosis present

## 2014-10-15 DIAGNOSIS — Z7982 Long term (current) use of aspirin: Secondary | ICD-10-CM

## 2014-10-15 DIAGNOSIS — G8929 Other chronic pain: Secondary | ICD-10-CM | POA: Diagnosis present

## 2014-10-15 DIAGNOSIS — F17210 Nicotine dependence, cigarettes, uncomplicated: Secondary | ICD-10-CM | POA: Diagnosis present

## 2014-10-15 DIAGNOSIS — I6522 Occlusion and stenosis of left carotid artery: Secondary | ICD-10-CM

## 2014-10-15 DIAGNOSIS — Z9889 Other specified postprocedural states: Secondary | ICD-10-CM

## 2014-10-15 DIAGNOSIS — M545 Low back pain: Secondary | ICD-10-CM | POA: Diagnosis present

## 2014-10-15 DIAGNOSIS — I6521 Occlusion and stenosis of right carotid artery: Principal | ICD-10-CM | POA: Diagnosis present

## 2014-10-15 DIAGNOSIS — I6529 Occlusion and stenosis of unspecified carotid artery: Secondary | ICD-10-CM | POA: Diagnosis present

## 2014-10-15 DIAGNOSIS — I2510 Atherosclerotic heart disease of native coronary artery without angina pectoris: Secondary | ICD-10-CM | POA: Diagnosis present

## 2014-10-15 DIAGNOSIS — K760 Fatty (change of) liver, not elsewhere classified: Secondary | ICD-10-CM | POA: Diagnosis present

## 2014-10-15 HISTORY — PX: STERNOTOMY: SHX1057

## 2014-10-15 HISTORY — PX: ENDARTERECTOMY: SHX5162

## 2014-10-15 HISTORY — PX: AORTA -INNOMIATE BYPASS: SHX5723

## 2014-10-15 HISTORY — PX: PATCH ANGIOPLASTY: SHX6230

## 2014-10-15 LAB — POCT I-STAT 3, ART BLOOD GAS (G3+)
Acid-Base Excess: 2 mmol/L (ref 0.0–2.0)
Bicarbonate: 28.2 mEq/L — ABNORMAL HIGH (ref 20.0–24.0)
O2 SAT: 97 %
Patient temperature: 100.3
TCO2: 30 mmol/L (ref 0–100)
pCO2 arterial: 49.7 mmHg — ABNORMAL HIGH (ref 35.0–45.0)
pH, Arterial: 7.366 (ref 7.350–7.450)
pO2, Arterial: 97 mmHg (ref 80.0–100.0)

## 2014-10-15 SURGERY — CREATION, BYPASS, ARTERIAL, AORTA TO BRACHIOCEPHALIC
Anesthesia: General | Site: Chest | Laterality: Right

## 2014-10-15 MED ORDER — HYDRALAZINE HCL 20 MG/ML IJ SOLN: 5.0000 mg | INTRAMUSCULAR | Status: DC | PRN

## 2014-10-15 MED ORDER — HEPARIN SODIUM (PORCINE) 1000 UNIT/ML IJ SOLN
INTRAMUSCULAR | Status: DC | PRN
  Administered 2014-10-15: 5000 [IU] via INTRAVENOUS
  Administered 2014-10-15: 10000 [IU] via INTRAVENOUS

## 2014-10-15 MED ORDER — POTASSIUM CHLORIDE CRYS ER 20 MEQ PO TBCR: 20.0000 meq | EXTENDED_RELEASE_TABLET | Freq: Every day | ORAL | Status: DC | PRN

## 2014-10-15 MED ORDER — LABETALOL HCL 5 MG/ML IV SOLN
10.0000 mg | INTRAVENOUS | Status: DC | PRN
  Filled 2014-10-15: qty 4

## 2014-10-15 MED ORDER — GUAIFENESIN-DM 100-10 MG/5ML PO SYRP
15.0000 mL | ORAL_SOLUTION | ORAL | Status: DC | PRN
  Administered 2014-10-19 – 2014-10-20 (×2): 15 mL via ORAL
  Filled 2014-10-15 (×2): qty 15

## 2014-10-15 MED ORDER — SUFENTANIL CITRATE 50 MCG/ML IV SOLN
INTRAVENOUS | Status: AC
Start: 2014-10-15 — End: 2014-10-15
  Filled 2014-10-15: qty 1

## 2014-10-15 MED ORDER — NEOSTIGMINE METHYLSULFATE 10 MG/10ML IV SOLN
INTRAVENOUS | Status: AC
  Filled 2014-10-15: qty 1

## 2014-10-15 MED ORDER — METOPROLOL TARTRATE 1 MG/ML IV SOLN
2.0000 mg | INTRAVENOUS | Status: DC | PRN
Start: 2014-10-15 — End: 2014-10-20

## 2014-10-15 MED ORDER — GLYCOPYRROLATE 0.2 MG/ML IJ SOLN
INTRAMUSCULAR | Status: AC
  Filled 2014-10-15: qty 3

## 2014-10-15 MED ORDER — SUFENTANIL CITRATE 50 MCG/ML IV SOLN
INTRAVENOUS | Status: AC
  Filled 2014-10-15: qty 1

## 2014-10-15 MED ORDER — DOCUSATE SODIUM 100 MG PO CAPS
100.0000 mg | ORAL_CAPSULE | Freq: Every day | ORAL | Status: DC
  Administered 2014-10-16 – 2014-10-17 (×2): 100 mg via ORAL
  Filled 2014-10-15 (×2): qty 1

## 2014-10-15 MED ORDER — PROPOFOL 10 MG/ML IV BOLUS
INTRAVENOUS | Status: AC
  Filled 2014-10-15: qty 20

## 2014-10-15 MED ORDER — LACTATED RINGERS IV SOLN
INTRAVENOUS | Status: DC | PRN
  Administered 2014-10-15 (×2): via INTRAVENOUS

## 2014-10-15 MED ORDER — LACTATED RINGERS IV SOLN
INTRAVENOUS | Status: DC | PRN
Start: 2014-10-15 — End: 2014-10-15
  Administered 2014-10-15: 07:00:00 via INTRAVENOUS

## 2014-10-15 MED ORDER — SODIUM CHLORIDE 0.9 % IJ SOLN
INTRAMUSCULAR | Status: AC
  Filled 2014-10-15: qty 10

## 2014-10-15 MED ORDER — EPHEDRINE SULFATE 50 MG/ML IJ SOLN
INTRAMUSCULAR | Status: DC | PRN
  Administered 2014-10-15: 10 mg via INTRAVENOUS

## 2014-10-15 MED ORDER — ONDANSETRON HCL 4 MG/2ML IJ SOLN: 4.0000 mg | Freq: Once | INTRAMUSCULAR | Status: DC | PRN

## 2014-10-15 MED ORDER — MEPERIDINE HCL 25 MG/ML IJ SOLN: 6.2500 mg | INTRAMUSCULAR | Status: DC | PRN

## 2014-10-15 MED ORDER — SODIUM CHLORIDE 0.9 % IR SOLN
Status: DC | PRN
  Administered 2014-10-15: 1

## 2014-10-15 MED ORDER — CHLORHEXIDINE GLUCONATE CLOTH 2 % EX PADS: 6.0000 | MEDICATED_PAD | Freq: Once | CUTANEOUS | Status: DC

## 2014-10-15 MED ORDER — OXYCODONE HCL 5 MG PO TABS
5.0000 mg | ORAL_TABLET | ORAL | Status: DC | PRN
  Administered 2014-10-15 – 2014-10-16 (×2): 10 mg via ORAL
  Filled 2014-10-15 (×2): qty 2

## 2014-10-15 MED ORDER — DEXTROSE 5 % IV SOLN
1.5000 g | Freq: Two times a day (BID) | INTRAVENOUS | Status: AC
  Administered 2014-10-15 – 2014-10-16 (×2): 1.5 g via INTRAVENOUS
  Filled 2014-10-15 (×2): qty 1.5

## 2014-10-15 MED ORDER — PROTAMINE SULFATE 10 MG/ML IV SOLN
INTRAVENOUS | Status: DC | PRN
  Administered 2014-10-15: 50 mg via INTRAVENOUS

## 2014-10-15 MED ORDER — SUCCINYLCHOLINE CHLORIDE 20 MG/ML IJ SOLN
INTRAMUSCULAR | Status: AC
  Filled 2014-10-15: qty 1

## 2014-10-15 MED ORDER — ROCURONIUM BROMIDE 50 MG/5ML IV SOLN
INTRAVENOUS | Status: AC
  Filled 2014-10-15: qty 2

## 2014-10-15 MED ORDER — PANTOPRAZOLE SODIUM 40 MG PO TBEC
40.0000 mg | DELAYED_RELEASE_TABLET | Freq: Every day | ORAL | Status: DC
  Administered 2014-10-16 – 2014-10-17 (×2): 40 mg via ORAL
  Filled 2014-10-15 (×2): qty 1

## 2014-10-15 MED ORDER — MAGNESIUM SULFATE 2 GM/50ML IV SOLN
2.0000 g | Freq: Every day | INTRAVENOUS | Status: DC | PRN
  Filled 2014-10-15: qty 50

## 2014-10-15 MED ORDER — DEXTROSE-NACL 5-0.45 % IV SOLN
INTRAVENOUS | Status: DC
  Administered 2014-10-15 – 2014-10-16 (×3): via INTRAVENOUS

## 2014-10-15 MED ORDER — HYDROMORPHONE HCL 1 MG/ML IJ SOLN: 0.2500 mg | INTRAMUSCULAR | Status: DC | PRN

## 2014-10-15 MED ORDER — OXYCODONE HCL 5 MG PO TABS: 5.0000 mg | ORAL_TABLET | Freq: Once | ORAL | Status: DC | PRN

## 2014-10-15 MED ORDER — ACETAMINOPHEN 325 MG RE SUPP
325.0000 mg | RECTAL | Status: DC | PRN
  Filled 2014-10-15: qty 2

## 2014-10-15 MED ORDER — ASPIRIN EC 325 MG PO TBEC
325.0000 mg | DELAYED_RELEASE_TABLET | Freq: Every day | ORAL | Status: DC
  Administered 2014-10-16 – 2014-10-20 (×5): 325 mg via ORAL
  Filled 2014-10-15 (×5): qty 1

## 2014-10-15 MED ORDER — PHENOL 1.4 % MT LIQD: 1.0000 | OROMUCOSAL | Status: DC | PRN

## 2014-10-15 MED ORDER — PROPOFOL 10 MG/ML IV BOLUS: INTRAVENOUS | Status: DC | PRN

## 2014-10-15 MED ORDER — ALUM & MAG HYDROXIDE-SIMETH 200-200-20 MG/5ML PO SUSP: 15.0000 mL | ORAL | Status: DC | PRN

## 2014-10-15 MED ORDER — SUFENTANIL CITRATE 50 MCG/ML IV SOLN
INTRAVENOUS | Status: DC | PRN
  Administered 2014-10-15: 50 ug via INTRAVENOUS
  Administered 2014-10-15: 5 ug via INTRAVENOUS
  Administered 2014-10-15: 25 ug via INTRAVENOUS
  Administered 2014-10-15 (×4): 10 ug via INTRAVENOUS
  Administered 2014-10-15: 40 ug via INTRAVENOUS

## 2014-10-15 MED ORDER — HEPARIN SODIUM (PORCINE) 1000 UNIT/ML IJ SOLN
INTRAMUSCULAR | Status: AC
  Filled 2014-10-15: qty 1

## 2014-10-15 MED ORDER — DEXTROSE 5 % IV SOLN
1.5000 g | Freq: Once | INTRAVENOUS | Status: DC
  Filled 2014-10-15: qty 1.5

## 2014-10-15 MED ORDER — MIDAZOLAM HCL 2 MG/2ML IJ SOLN
INTRAMUSCULAR | Status: AC
  Filled 2014-10-15: qty 2

## 2014-10-15 MED ORDER — SODIUM CHLORIDE 0.9 % IV SOLN: 500.0000 mL | Freq: Once | INTRAVENOUS | Status: AC | PRN

## 2014-10-15 MED ORDER — MORPHINE SULFATE 2 MG/ML IJ SOLN
2.0000 mg | INTRAMUSCULAR | Status: DC | PRN
  Administered 2014-10-15 (×5): 4 mg via INTRAVENOUS
  Administered 2014-10-15 – 2014-10-16 (×5): 2 mg via INTRAVENOUS
  Administered 2014-10-16 (×2): 4 mg via INTRAVENOUS
  Administered 2014-10-16 (×3): 2 mg via INTRAVENOUS
  Administered 2014-10-16: 4 mg via INTRAVENOUS
  Administered 2014-10-17 – 2014-10-18 (×5): 2 mg via INTRAVENOUS
  Filled 2014-10-15: qty 1
  Filled 2014-10-15: qty 2
  Filled 2014-10-15 (×2): qty 1
  Filled 2014-10-15 (×4): qty 2
  Filled 2014-10-15 (×4): qty 1
  Filled 2014-10-15: qty 2
  Filled 2014-10-15 (×3): qty 1
  Filled 2014-10-15 (×2): qty 2
  Filled 2014-10-15 (×2): qty 1

## 2014-10-15 MED ORDER — THROMBIN 20000 UNITS EX SOLR
CUTANEOUS | Status: AC
  Filled 2014-10-15: qty 20000

## 2014-10-15 MED ORDER — MIDAZOLAM HCL 5 MG/5ML IJ SOLN
INTRAMUSCULAR | Status: DC | PRN
  Administered 2014-10-15 (×2): 2 mg via INTRAVENOUS
  Administered 2014-10-15 (×2): 1 mg via INTRAVENOUS
  Administered 2014-10-15: 5 mg via INTRAVENOUS

## 2014-10-15 MED ORDER — ACETAMINOPHEN 325 MG PO TABS
325.0000 mg | ORAL_TABLET | ORAL | Status: DC | PRN
  Administered 2014-10-18 – 2014-10-19 (×3): 650 mg via ORAL
  Administered 2014-10-20 (×2): 325 mg via ORAL
  Filled 2014-10-15 (×4): qty 2

## 2014-10-15 MED ORDER — LACTATED RINGERS IV SOLN
INTRAVENOUS | Status: DC | PRN
  Administered 2014-10-15 (×2): via INTRAVENOUS

## 2014-10-15 MED ORDER — LIDOCAINE HCL (CARDIAC) 20 MG/ML IV SOLN
INTRAVENOUS | Status: AC
  Filled 2014-10-15: qty 5

## 2014-10-15 MED ORDER — ROCURONIUM BROMIDE 100 MG/10ML IV SOLN
INTRAVENOUS | Status: DC | PRN
  Administered 2014-10-15 (×2): 50 mg via INTRAVENOUS
  Administered 2014-10-15 (×2): 20 mg via INTRAVENOUS
  Administered 2014-10-15: 10 mg via INTRAVENOUS
  Administered 2014-10-15: 20 mg via INTRAVENOUS

## 2014-10-15 MED ORDER — EPHEDRINE SULFATE 50 MG/ML IJ SOLN
INTRAMUSCULAR | Status: AC
  Filled 2014-10-15: qty 1

## 2014-10-15 MED ORDER — PROTAMINE SULFATE 10 MG/ML IV SOLN
INTRAVENOUS | Status: AC
Start: 2014-10-15 — End: 2014-10-15
  Filled 2014-10-15: qty 5

## 2014-10-15 MED ORDER — MIDAZOLAM HCL 10 MG/2ML IJ SOLN
INTRAMUSCULAR | Status: AC
  Filled 2014-10-15: qty 2

## 2014-10-15 MED ORDER — SODIUM CHLORIDE 0.9 % IR SOLN
Status: DC | PRN
  Administered 2014-10-15: 500 mL

## 2014-10-15 MED ORDER — OXYCODONE HCL 5 MG/5ML PO SOLN: 5.0000 mg | Freq: Once | ORAL | Status: DC | PRN

## 2014-10-15 MED ORDER — SODIUM CHLORIDE 0.9 % IV SOLN: INTRAVENOUS | Status: DC

## 2014-10-15 MED ORDER — THROMBIN 20000 UNITS EX KIT
PACK | CUTANEOUS | Status: DC | PRN
  Administered 2014-10-15: 20 mL via TOPICAL

## 2014-10-15 MED ORDER — PHENYLEPHRINE HCL 10 MG/ML IJ SOLN
10.0000 mg | INTRAVENOUS | Status: DC | PRN
  Administered 2014-10-15: 10 ug/min via INTRAVENOUS

## 2014-10-15 MED ORDER — CETYLPYRIDINIUM CHLORIDE 0.05 % MT LIQD
7.0000 mL | Freq: Two times a day (BID) | OROMUCOSAL | Status: DC
  Administered 2014-10-15 – 2014-10-19 (×6): 7 mL via OROMUCOSAL

## 2014-10-15 MED ORDER — ONDANSETRON HCL 4 MG/2ML IJ SOLN
4.0000 mg | Freq: Four times a day (QID) | INTRAMUSCULAR | Status: DC | PRN
  Administered 2014-10-15 – 2014-10-16 (×2): 4 mg via INTRAVENOUS
  Filled 2014-10-15 (×2): qty 2

## 2014-10-15 MED ORDER — ARTIFICIAL TEARS OP OINT
TOPICAL_OINTMENT | OPHTHALMIC | Status: AC
  Filled 2014-10-15: qty 3.5

## 2014-10-15 MED ORDER — SODIUM CHLORIDE 0.9 % IJ SOLN
INTRAMUSCULAR | Status: AC
  Filled 2014-10-15: qty 20

## 2014-10-15 MED ORDER — DEXMEDETOMIDINE HCL IN NACL 200 MCG/50ML IV SOLN
INTRAVENOUS | Status: DC | PRN
  Administered 2014-10-15: .7 ug/kg/h via INTRAVENOUS

## 2014-10-15 MED FILL — Sodium Chloride Irrigation Soln 0.9%: Qty: 3000 | Status: AC

## 2014-10-15 MED FILL — Heparin Sodium (Porcine) Inj 1000 Unit/ML: INTRAMUSCULAR | Qty: 30 | Status: AC

## 2014-10-15 MED FILL — Sodium Chloride IV Soln 0.9%: INTRAVENOUS | Qty: 1000 | Status: AC

## 2014-10-15 SURGICAL SUPPLY — 64 items
ATTRACTOMAT 16X20 MAGNETIC DRP (DRAPES) ×1 IMPLANT
BAG DECANTER FOR FLEXI CONT (MISCELLANEOUS) ×1 IMPLANT
BLADE SAW STERNAL (BLADE) ×1 IMPLANT
CANISTER SUCTION 2500CC (MISCELLANEOUS) ×4 IMPLANT
CANNULA VESSEL 3MM 2 BLNT TIP (CANNULA) ×2 IMPLANT
CATH ROBINSON RED A/P 18FR (CATHETERS) ×4 IMPLANT
CONN ST 1/4X3/8  BEN (MISCELLANEOUS)
CONN ST 1/4X3/8 BEN (MISCELLANEOUS) IMPLANT
CONN Y 3/8X3/8X3/8  BEN (MISCELLANEOUS)
CONN Y 3/8X3/8X3/8 BEN (MISCELLANEOUS) IMPLANT
CONT SPEC 4OZ CLIKSEAL STRL BL (MISCELLANEOUS) ×1 IMPLANT
COVER SURGICAL LIGHT HANDLE (MISCELLANEOUS) ×4 IMPLANT
DRAIN CHANNEL 32F RND 10.7 FF (WOUND CARE) IMPLANT
DRAPE CARDIOVASCULAR INCISE (DRAPES)
DRAPE INCISE IOBAN 66X45 STRL (DRAPES) ×10 IMPLANT
DRAPE SRG 135X102X78XABS (DRAPES) IMPLANT
DRILL BIT 2.2MM W/14M STOP (BIT) ×1 IMPLANT
DRSG AQUACEL AG ADV 3.5X10 (GAUZE/BANDAGES/DRESSINGS) ×2 IMPLANT
DRSG COVADERM 4X14 (GAUZE/BANDAGES/DRESSINGS) IMPLANT
ELECT BLADE 4.0 EZ CLEAN MEGAD (MISCELLANEOUS) ×4
ELECTRODE BLDE 4.0 EZ CLN MEGD (MISCELLANEOUS) IMPLANT
EVACUATOR SILICONE 100CC (DRAIN) ×1 IMPLANT
GLOVE BIO SURGEON STRL SZ 6 (GLOVE) ×1 IMPLANT
GLOVE BIO SURGEON STRL SZ 6.5 (GLOVE) ×2 IMPLANT
GLOVE BIO SURGEON STRL SZ7.5 (GLOVE) ×6 IMPLANT
GLOVE BIOGEL PI IND STRL 6.5 (GLOVE) ×6 IMPLANT
GLOVE BIOGEL PI INDICATOR 6.5 (GLOVE) ×6
GOWN STRL REUS W/ TWL LRG LVL3 (GOWN DISPOSABLE) ×9 IMPLANT
GOWN STRL REUS W/TWL LRG LVL3 (GOWN DISPOSABLE) ×12
HEMASHIELD FINESSE CARDIO (Vascular Products) ×4 IMPLANT
HEMOSTAT POWDER SURGIFOAM 1G (HEMOSTASIS) ×7 IMPLANT
KIT ROOM TURNOVER OR (KITS) ×4 IMPLANT
KIT SUCTION CATH 14FR (SUCTIONS) IMPLANT
NS IRRIG 1000ML POUR BTL (IV SOLUTION) ×8 IMPLANT
PACK AORTA (CUSTOM PROCEDURE TRAY) ×4 IMPLANT
PAD ARMBOARD 7.5X6 YLW CONV (MISCELLANEOUS) ×8 IMPLANT
PAD DEFIB R2 (MISCELLANEOUS) ×2 IMPLANT
PATCH HEMASHIELD FINESS CARDIO (Vascular Products) ×1 IMPLANT
PENCIL BUTTON HOLSTER BLD 10FT (ELECTRODE) ×1 IMPLANT
PLATE UNIVERSAL 8 HOLE (Plate) ×2 IMPLANT
PROBE PENCIL 8 MHZ STRL DISP (MISCELLANEOUS) ×1 IMPLANT
RUBBERBAND STERILE (MISCELLANEOUS) ×2 IMPLANT
SCREW SELF TAP MAT 2.9X14MM (Screw) ×4 IMPLANT
SPONGE LAP 4X18 X RAY DECT (DISPOSABLE) ×2 IMPLANT
SUT ETHILON 3 0 PS 1 (SUTURE) ×1 IMPLANT
SUT PROLENE 3 0 SH DA (SUTURE) ×4 IMPLANT
SUT PROLENE 4 0 RB 1 (SUTURE) ×8
SUT PROLENE 4-0 RB1 .5 CRCL 36 (SUTURE) ×6 IMPLANT
SUT PROLENE 5 0 C 1 24 (SUTURE) ×4 IMPLANT
SUT PROLENE 7 0 BV1 MDA (SUTURE) ×2 IMPLANT
SUT SILK  1 MH (SUTURE) ×4
SUT SILK 1 MH (SUTURE) ×10 IMPLANT
SUT SILK 1 TIES 10X30 (SUTURE) ×1 IMPLANT
SUT SILK 2 0 SH CR/8 (SUTURE) ×4 IMPLANT
SUT SILK 3 0 SH CR/8 (SUTURE) IMPLANT
SUT STEEL 6MS V (SUTURE) ×10 IMPLANT
SUT VIC AB 1 CTX 18 (SUTURE) ×5 IMPLANT
SUT VIC AB 2-0 CT1 18 (SUTURE) ×1 IMPLANT
SUT VIC AB 2-0 CTX 36 (SUTURE) ×10 IMPLANT
SUT VIC AB 3-0 X1 27 (SUTURE) ×10 IMPLANT
TRAY FOLEY CATH 16FRSI W/METER (SET/KITS/TRAYS/PACK) ×4 IMPLANT
TUBE SUCTION CARDIAC 10FR (CANNULA) ×2 IMPLANT
WATER STERILE IRR 1000ML POUR (IV SOLUTION) ×4 IMPLANT
YANKAUER SUCT BULB TIP NO VENT (SUCTIONS) ×2 IMPLANT

## 2014-10-15 NOTE — Progress Notes (Signed)
Patient ID: Bradley Sawyer, male   DOB: [birth year], 58 y.o.   MRN: [id number] EVENING ROUNDS NOTE :     [address]       [place] [address]             [phone]                 Day of Surgery Procedure(s) (LRB): ENDARTERCTOMY OF  INNOMINATE ARTERY  (N/A) PATCH ANGIOPLASTY OF RIGHT SUBCLAVIAN ARTERY , RIGHT COMMON CAROTID ARTERY & INNOMINATE ARTERY (Right) ENDARTERECTOMY RIGHT SUBCLAVIAN ARTERY (Right) ENDARTERECTOMY RIGHT COMMON CAROTID ARTERY (Right) PARTIAL STERNOTOMY & PLATING OF STERNUM (N/A)  Total Length of Stay:  LOS: 0 days  BP 114/68 mmHg  Pulse 89  Temp(Src) 97.8 F (36.6 C) (Oral)  Resp 17  SpO2 95%  .Intake/Output      11/23 0701 - 11/24 0700 11/24 0701 - 11/25 0700   I.V.  2837.5   Total Intake   2837.5   Urine  850   Drains  25   Blood  300   Total Output   1175   Net   +1662.5          . dextrose 5 % and 0.45% NaCl 75 mL/hr at 10/15/14 1400     Lab Results  Component Value Date   WBC 10.2 10/04/2014   HGB 12.4* 10/04/2014   HCT 37.1* 10/04/2014   PLT 213 10/04/2014   GLUCOSE 119* 10/04/2014   CHOL 121 12/22/2013   TRIG 142 12/22/2013   HDL 41 12/22/2013   LDLDIRECT 109* 03/16/2012   LDLCALC 52 12/22/2013   ALT 20 10/04/2014   AST 18 10/04/2014   NA 141 10/04/2014   K 3.4* 10/04/2014   CL 102 10/04/2014   CREATININE 0.76 10/04/2014   BUN 9 10/04/2014   CO2 22 10/04/2014   TSH 1.095 03/12/2013   INR 1.03 10/04/2014   HGBA1C 5.9* 12/21/2013   Awake and neuro intact,  Full pulse in rt hand Complaint of  Back pain getting pain meds  Grace Isaac MD  Beeper [phone] Office [phone] 10/15/2014 6:41 PM

## 2014-10-15 NOTE — Telephone Encounter (Signed)
I tried to call without luck. Was Rx'd Sep but he did not keep Nov appt. I need to know is he still smoking, any side effects? I could extend it one more month if he can tell me these things.

## 2014-10-15 NOTE — Anesthesia Preprocedure Evaluation (Addendum)
Anesthesia Evaluation  Patient identified by MRN, date of birth, ID band Patient awake    Reviewed: Allergy & Precautions, H&P , NPO status , Patient's Chart, lab work & pertinent test results  History of Anesthesia Complications Negative for: history of anesthetic complications  Airway Mallampati: II  TM Distance: >3 FB Neck ROM: Full    Dental  (+) Edentulous Upper, Edentulous Lower   Pulmonary neg pulmonary ROS, COPDformer smoker,  1 ppd smoker x 48 years + rhonchi   + decreased breath sounds      Cardiovascular hypertension, Pt. on medications + CAD and + Peripheral Vascular Disease Rhythm:Regular Rate:Normal     Neuro/Psych CVA 2015 without residual CVA    GI/Hepatic GERD-  Medicated and Controlled,Fatty liver   Endo/Other  negative endocrine ROS  Renal/GU negative Renal ROS     Musculoskeletal negative musculoskeletal ROS (+)   Abdominal   Peds  Hematology negative hematology ROS (+)   Anesthesia Other Findings   Reproductive/Obstetrics negative OB ROS                            Anesthesia Physical Anesthesia Plan  ASA: III  Anesthesia Plan: General   Post-op Pain Management:    Induction: Intravenous  Airway Management Planned: Oral ETT  Additional Equipment: Arterial line, CVP and Ultrasound Guidance Line Placement  Intra-op Plan:   Post-operative Plan: Post-operative intubation/ventilation  Informed Consent: I have reviewed the patients History and Physical, chart, labs and discussed the procedure including the risks, benefits and alternatives for the proposed anesthesia with the patient or authorized representative who has indicated his/her understanding and acceptance.     Plan Discussed with: CRNA and Surgeon  Anesthesia Plan Comments:        Anesthesia Quick Evaluation

## 2014-10-15 NOTE — Transfer of Care (Signed)
Immediate Anesthesia Transfer of Care Note  Patient: Bradley Sawyer  Procedure(s) Performed: Procedure(s): ENDARTERCTOMY OF  INNOMINATE ARTERY  (N/A) PATCH ANGIOPLASTY OF RIGHT SUBCLAVIAN ARTERY , RIGHT COMMON CAROTID ARTERY & INNOMINATE ARTERY (Right) ENDARTERECTOMY RIGHT SUBCLAVIAN ARTERY (Right) ENDARTERECTOMY RIGHT COMMON CAROTID ARTERY (Right) PARTIAL STERNOTOMY & PLATING OF STERNUM (N/A)  Patient Location: SICU  Anesthesia Type:General  Level of Consciousness: awake, oriented, sedated and patient cooperative  Airway & Oxygen Therapy: Patient Spontanous Breathing, Patient remains intubated per anesthesia plan and Patient placed on Ventilator (see vital sign flow sheet for setting)  Post-op Assessment: Report given to PACU RN, Post -op Vital signs reviewed and stable and Patient moving all extremities  Post vital signs: Reviewed and stable  Complications: No apparent anesthesia complications

## 2014-10-15 NOTE — Brief Op Note (Signed)
10/15/2014  12:58 PM  PATIENT:  Bradley Sawyer  58 y.o. 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

## 2014-10-15 NOTE — H&P (View-Only) (Signed)
[facility] HISTORY AND PHYSICAL    History of Present Illness:  Patient is a 58 y.o. year old male who returns for follow-up today for now known innominate artery stenosis.. This was found incidentally on measuring the blood pressure in his right arm. The patient denies any exertional fatigue in the right arm. He does have occasional numbness in both hands. However the right is not worse than left. He states he did have a stroke in the past which affected his speech memory and right hand. This was after a back operation in February of 2015. He fully recovered. He denies any recent symptoms of TIA or stroke. He denies any symptoms of dizziness. However, he has had several episodes recently where he had a lower field cut in his right eye. This would resolve and a few minutes. He is on Plavix. He currently is a smoker but is trying to quit. He is right handed. He states that they have not been able to get a blood pressure in his right arm for several months.  Other medical problems include hyperlipidemia, hypertension, tobacco abuse, COPD, chronic back pain all of which are currently stable. He has had previous cervical and lumbar fusion. Recent cardiac catheterization showed no significant flow-limiting coronary lesions.    Past Medical History   Diagnosis  Date   .  Hyperlipidemia     .  Hypertension     .  GERD (gastroesophageal reflux disease)     .  Tobacco abuse     .  COPD (chronic obstructive pulmonary disease)         bronchitis   .  Disorder of vocal cord     .  Cocaine dependence         Last use 1998, attends 3 NA meetings weekly.   .  Right shoulder pain         2/2 partial rotator cuff tear, tendinopathy, mild subacromial/subdeltioid bursitis, A/C joint arthropathy per MRI done in 3/07   .  Arthritis     .  Anxiety     .  Compression fracture         L2   .  CVA (cerebral infarction)     .  Fatty liver         Past Surgical History   Procedure   Laterality  Date   .  Neck surgery       .  Lumbar spine surgery    12/18/2013       L2     DR POOL   .  Anterior lat lumbar fusion  N/A  12/18/2013       Procedure: Lumbar Two Anteriorlateral Corpectomy w/ cage, interbody fusion, anterior plating, Lumbar One to Lumbar Three percutaneous pedicle screws;  Surgeon: Charlie Pitter, MD;  Location: [facility]UR[facility];  Service: Neurosurgery;  Laterality: N/A;   .  Tee without cardioversion  N/A  12/25/2013       Procedure: TRANSESOPHAGEAL ECHOCARDIOGRAM (TEE);  Surgeon: Larey Dresser, MD;  Location: [facility] ENDOSCOPY;  Service: Cardiovascular;  Laterality: N/A;     Social History History   Substance Use Topics   .  Smoking status:  Current Every Day Smoker -- 1.00 packs/day for 48 years       Types:  Cigarettes   .  Smokeless tobacco:  Never Used         Comment: Started back x 2 weeks.   .  Alcohol Use:  No     Family History Family History   Problem  Relation  Age of Onset   .  Diabetes  Paternal Grandmother         1 st degree relatives   .  Heart disease  Father     .  Diabetes  Brother     .  Heart disease  Brother     .  Muscular dystrophy  Brother     .  Muscular dystrophy  Sister       Allergies  No Known Allergies     Current Outpatient Prescriptions   Medication  Sig  Dispense  Refill   .  albuterol (PROVENTIL HFA;VENTOLIN HFA) 108 (90 BASE) MCG/ACT inhaler  Inhale 2 puffs into the lungs every 6 (six) hours as needed for wheezing.   3 Inhaler   4   .  amLODipine (NORVASC) 10 MG tablet  Take 1 tablet (10 mg total) by mouth daily.   30 tablet   11   .  aspirin EC 325 MG EC tablet  Take 1 tablet (325 mg total) by mouth daily.   30 tablet   0   .  clopidogrel (PLAVIX) 75 MG tablet  Take 1 tablet (75 mg total) by mouth daily.   30 tablet   6   .  diazepam (VALIUM) 5 MG tablet  Take 1-2 tablets (5-10 mg total) by mouth every 6 (six) hours as needed for muscle spasms.   60 tablet   0   .  FLUoxetine (PROZAC) 10 MG capsule  Take 1 capsule  (10 mg total) by mouth daily.   90 capsule   4   .  Fluticasone-Salmeterol (ADVAIR) 500-50 MCG/DOSE AEPB  Inhale 1 puff into the lungs every 12 (twelve) hours.   60 each   4   .  gabapentin (NEURONTIN) 400 MG capsule  Take 2 capsules (800 mg total) by mouth 3 (three) times daily.   180 capsule   4   .  lisinopril-hydrochlorothiazide (PRINZIDE) 20-12.5 MG per tablet  Take 1 tablet by mouth daily.   30 tablet   6   .  meloxicam (MOBIC) 15 MG tablet  Take 1 tablet (15 mg total) by mouth daily.   30 tablet   2   .  nitroGLYCERIN (NITROSTAT) 0.4 MG SL tablet  Place 1 tablet (0.4 mg total) under the tongue every 5 (five) minutes as needed for chest pain.   25 tablet   8   .  omega-3 acid ethyl esters (LOVAZA) 1 G capsule  Take 2 capsules (2 g total) by mouth 2 (two) times daily.   90 capsule   3   .  oxyCODONE-acetaminophen (ROXICET) 5-325 MG per tablet  Take 1-2 tablets by mouth every 6 (six) hours as needed.   180 tablet   0   .  pantoprazole (PROTONIX) 40 MG tablet  Take 1 tablet (40 mg total) by mouth daily.   30 tablet   6   .  pravastatin (PRAVACHOL) 20 MG tablet  Take 1 tablet (20 mg total) by mouth daily.   30 tablet   11      No current facility-administered medications for this visit.     ROS:    General:  No weight loss, Fever, chills  HEENT: No recent headaches, no nasal bleeding, no visual changes, no sore throat  Neurologic: No dizziness, blackouts, seizures. No recent symptoms of stroke or mini- stroke. No recent episodes  of slurred speech, or temporary blindness.  Cardiac: No recent episodes of chest pain/pressure, no shortness of breath at rest.  + shortness of breath with exertion.  Denies history of atrial fibrillation or irregular heartbeat  Vascular: No history of rest pain in feet.  No history of claudication.  No history of non-healing ulcer, No history of DVT    Pulmonary: No home oxygen, no productive cough, no hemoptysis,  + asthma or wheezing  Musculoskeletal:  [ x]  Arthritis, [x ] Low back pain,  [x ] Joint pain  Hematologic:No history of hypercoagulable state.  No history of easy bleeding.  No history of anemia  Gastrointestinal: No hematochezia or melena,  No gastroesophageal reflux, no trouble swallowing  Urinary: [ ]  chronic Kidney disease, [ ]  on HD - [ ]  MWF or [ ]  TTHS, [ ]  Burning with urination, [ ]  Frequent urination, [ ]  Difficulty urinating;    Skin: No rashes  Psychological: + history of anxiety,  No history of depression   Physical Examination    Filed Vitals:   09/19/14 1244  BP: 144/75  Pulse: 67  Resp: 16  Height: 5' 10.5" (1.791 m)  Weight: 225 lb 14.4 oz (102.468 kg)    General:  Alert and oriented, no acute distress HEENT: Normal Neck: 1-2+ right carotid pulse 2+ left carotid pulse bilateral carotid bruits, well-healed right transverse neck scar from previous fusion   Pulmonary: Coarse breath sounds at bases bilaterally Cardiac: Regular Rate and Rhythm without murmur Abdomen: Soft, non-tender, non-distended, no mass Skin: No rash Extremity Pulses:  2+ radial, brachial, femoral, dorsalis pedis, posterior tibial pulses bilaterally Musculoskeletal: No deformity or edema     Neurologic: Upper and lower extremity motor 5/5 and symmetric  DATA:  Carotid duplex scan dated 07/25/2014 done in our office today. I reviewed and interpreted this study. This shows bilateral less than 40% internal carotid artery stenosis. Right brachial pressure 80 left brachial pressure 138 right carotid system velocities decreased suggestive of right common carotid stenosis.  Arteriogram showed high-grade exophytic calcified stenosis in the mid innominate with retrograde filling of the right subclavian via the vertebral no significant carotid bifurcation stenosis bilaterally   ASSESSMENT:  currently asymptomatic innominate stenosis but most likely the source of his stroke several months ago.  PLAN:  aorto to innominate bypass or possible  endarterectomy or possible subclavian bypass. We will coordinate this with Dr. Servando Snare schedule as he will assist [place] in obtaining access to the chest. Risks benefits possible complications and procedure details were explained to the patient today including not limited to bleeding infection stroke risk of 2-5%. We will need to stop his Plavix 7-10 days prior to the procedure. He will remain on aspirin postoperatively.  I also contacted his pain management clinic today at his request. Apparently had refused to continue his pain medications thinking that we would do that now that he is being considered for operation. I discussed the patient that we would manage his pain postoperatively but that we would not provide him pain medication for his chronic back pain. I also left a message with his pain clinic discussing this.   Ruta Hinds, MD [facility] Office: [phone] Pager: [phone]

## 2014-10-15 NOTE — Op Note (Signed)
Procedure: Endarterectomy of innominate right common and right subclavian arteries with Dacron patch angioplasty  Preoperative diagnosis: Symptomatic innominate artery stenosis  Postoperative diagnosis: Same  Anesthesia: Gen.  Co-surgeon: Ceasar Mons M.D.  Operative findings: Large exophytic plaque innominate artery extending into proximal subclavian and common carotid arteries, Dacron patch  Operative details: After obtaining informed consent, the patient was taken to the operating room. The patient was placed in supine position on the operating room table. After induction of general anesthesia and endotracheal intubation, the patient was prepped and draped in usual sterile fashion from the chin down to the knees. Next Dr. Servando Snare performed a sternotomy with slight extension toward the right shoulder for exposure. The innominate artery and right subclavian artery and right common carotid arteries were dissected free circumferentially. The innominate vein was protected and moved out of the way. The vagus nerve was identified posterior to the takeoff of the subclavian and right common carotid. This was protected. The vertebral artery was dissected free circumferentially and a vessel loop was placed around this. The artery was fairly soft and clamp mobile in the innominate at its origin. A vessel loop was placed around this. A vessel loop was also placed around the subclavian artery just distal to the vertebral artery where it was also soft. The proximal common carotid artery also had a vessel loop placed around it. The patient was given 10,000 units of intravenous heparin. The patient was given an additional 5000 units of heparin during the course of the case. Next a peripheral DeBakey clamp was placed on the proximal common carotid as well as the distal subclavian artery and the vertebral artery controlled with a vessel loop. Finally the innominate artery was controlled with a peripheral DeBakey clamp.  Longitudinal opening was made in the anterolateral surface of the innominate artery. There was a large exophytic plaque and a large amount of granular atherosclerotic debris at the bifurcation of the innominate artery. This was all removed under direct vision. A good proximal endpoint was obtained. A good distal endpoint was also obtained. The posterior wall of the subclavian artery and common carotid artery was tacked down to make sure that the endpoint was stabilized. This was done with interrupted 7-0 Prolene sutures. After all loose debris had been removed everything was thoroughly irrigated with heparinized saline. Dacron patch was then brought up in the operative field and formed into a bifid type patch with one tongue of the patch extending approximately 3 cm into the right subclavian artery and 2 cm into the right common carotid artery and the tail end of the patch extending down onto the proximal innominate artery. This was sewn in place with a running 5-0 Prolene suture. Just prior to completion anastomosis was forebled backbled and thoroughly flushed. Anastomosis was secured labs were first released to the subclavian artery and in the innominate artery released to flush down the subclavian artery. Next the vertebral artery was reopened finally followed by the common carotid artery each of these was done after approximate 5 cardiac cycles. There was good Doppler flow in the common carotid subclavian and vertebral arteries at this point. Hemostasis was obtained with addition of a repair stitch. The patient was also given 50 mg of protamine. The closure of the chest is dictated as a separate operative note by Dr. Servando Snare. The patient was taken to the intensive care unit in stable condition. He was still under general anesthesia and on a ventilator at the time of departure from the operating room. Instrument  sponge and needle count was correct at the end of the case.  Ruta Hinds, MD [facility] [place] Office: [phone] Pager: [phone]

## 2014-10-15 NOTE — OR Nursing (Signed)
Second Call to SICU at 1240.

## 2014-10-15 NOTE — Interval H&P Note (Signed)
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 After consideration of risks, benefits and other options for treatment, the patient has consented to  Procedure(s): AORTA -INNOMIATE BYPASS to subclavian bypass with possible sternotomy partial/complete (N/A) as a surgical intervention .  The patient's history has been reviewed, patient examined, no change in status, stable for surgery.  I have reviewed the patient's chart and labs.  Questions were answered to the patient's satisfaction.     Antania Hoefling E

## 2014-10-15 NOTE — Anesthesia Postprocedure Evaluation (Signed)
Anesthesia Post Note  Patient: Bradley Sawyer  Procedure(s) Performed: Procedure(s) (LRB): ENDARTERCTOMY OF  INNOMINATE ARTERY  (N/A) PATCH ANGIOPLASTY OF RIGHT SUBCLAVIAN ARTERY , RIGHT COMMON CAROTID ARTERY & INNOMINATE ARTERY (Right) ENDARTERECTOMY RIGHT SUBCLAVIAN ARTERY (Right) ENDARTERECTOMY RIGHT COMMON CAROTID ARTERY (Right) PARTIAL STERNOTOMY & PLATING OF STERNUM (N/A)  Anesthesia type: General  Patient location: ICU  Post pain: Pain level controlled  Post assessment: Post-op Vital signs reviewed  Last Vitals:  Filed Vitals:   10/15/14 1400  BP: 120/72  Pulse: 105  Temp:   Resp: 20    Post vital signs: stable  Level of consciousness: Patient remains intubated per anesthesia plan  Complications: No apparent anesthesia complications

## 2014-10-15 NOTE — Progress Notes (Signed)
Pt awake extubated moving all extremities following commands no voice changes 2+ right radial and brachial pulse JP 25 serosanguinous BP symmetric R and L  Stable post innominate carotid subclavian endarterectomy Clears later this evening D/c JP in am if minimal output   Ruta Hinds, MD [facility] [place]: [phone] Pager: [phone]

## 2014-10-15 NOTE — Procedures (Signed)
Extubation Procedure Note  Patient Details:   Name: TRUSTIN CHAPA DOB: [birth year] MRN: [id number]   Airway Documentation:     Evaluation  O2 sats: stable throughout Complications: No apparent complications Patient did tolerate procedure well. Bilateral Breath Sounds: Clear, Diminished Suctioning: Oral, Airway Yes    Pt extubated to 2L [place] per Heart Protocol. NIF -30, VC -.80. ABG within normal limits. Pt stable throughout with no complications. RT will continue to monitor.   Jesse Sans 10/15/2014, 2:50 PM

## 2014-10-15 NOTE — Progress Notes (Signed)
  Day of Surgery Note    Subjective:  Intubated, awake moves all extremities  Filed Vitals:   10/15/14 1400  BP: 120/72  Pulse: 105  Temp:   Resp: 20    Incisions:   covered Extremities:  2+ right radial pulse Cardiac:  regular Lungs:  intubated Neuro:  Agitated, but moving all extremities    Assessment/Plan:  This is a 58 y.o. male who is s/p Endarterectomy of innominate right common and right subclavian arteries with Dacron patch angioplasty  -pt doing well and has a palpable right radial pulse -agitated on the vent-awake and following commands.  Awaiting blood gas for extubation -JP drain with ~ 50 cc since his arrival from the OR. -anticipate extubation this afternoon   Leontine Locket, PA-C 10/15/2014 2:24 PM

## 2014-10-15 NOTE — Anesthesia Procedure Notes (Signed)
Procedures

## 2014-10-15 NOTE — H&P (Signed)
Bradley Sawyer [address]       [place] [address]             [phone]                    Bradley Sawyer [facility] Medical Record #[id number] Date of Birth: [birth year]  Referring:Dr Fields Primary Care: Albin Felling, MD  Chief Complaint:    No chief complaint on file.   History of Present Illness:    Bradley Sawyer 58 y.o. male is seen in the office  who presents for evaluation of right subclavian artery stenosis. In January 2015 he had back surgery after being involved in a lawnmower accident. Just prior to discharge he was noted to have neurologic changes including decreased vision in right eye and weaknesses left side. He was admitted to the ICU and worked up for stroke. He was then later seen by Dr. fields because of absent right radial pulse. Further evaluation with arch aortogram demonstrated high-grade proximal innominate artery stenosis.  This was found incidentally on measuring the blood pressure in his right arm. The patient denies any exertional fatigue in the right arm. He does have occasional numbness in both hands. However the right is not worse than left. He states he did have a stroke in the past which Inderal his speech memory and right hand.  He fully recovered. He denies any recent symptoms of TIA or stroke. He denies any symptoms of dizziness. However, he has had several episodes recently where he had a lower field cut in his right eye. This would resolve and a few minutes. He is on aspirin and Plavix daily. He currently is a smoker but is trying to quit. He is right handed. He states that they have not been able to get a blood pressure in his right arm for several months. Other medical problems include hyperlipidemia, hypertension, tobacco abuse, COPD, chronic back pain all of which are currently stable. He has had previous cervical and lumbar fusion     Current Activity/ Functional Status:  Patient is independent with mobility/ambulation, transfers,  ADL's, IADL's.   Zubrod Score: At the time of surgery this patient's most appropriate activity status/level should be described as: []     0    Normal activity, no symptoms [x]     1    Restricted in physical strenuous activity but ambulatory, able to do out light work []     2    Ambulatory and capable of self care, unable to do work activities, up and about               >50 % of waking hours                              []     3    Only limited self care, in bed greater than 50% of waking hours []     4    Completely disabled, no self care, confined to bed or chair []     5    Moribund   Past Medical History  Diagnosis Date  . Hyperlipidemia   . Hypertension   . GERD (gastroesophageal reflux disease)   . Tobacco abuse   . COPD (chronic obstructive pulmonary disease)     bronchitis  . Disorder of vocal cord   . Cocaine dependence     Last use 1998, attends 3 NA meetings weekly.  Marland Kitchen  Right shoulder pain     2/2 partial rotator cuff tear, tendinopathy, mild subacromial/subdeltioid bursitis, A/C joint arthropathy per MRI done in 3/07  . Arthritis   . Anxiety   . Compression fracture     L2  . CVA (cerebral infarction)   . Fatty liver   . Hx of cardiovascular stress test     ETT/Lexiscan-Myoview (10/15):  Inferior, inferoapical, apical lateral defect c/w mild ischemia, EF 66%; Intermediate Risk  . 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 Neck surgery    . Lumbar spine surgery  12/18/2013    L2     DR POOL  . Anterior lat lumbar fusion N/A 12/18/2013    Procedure: Lumbar Two Anteriorlateral Corpectomy w/ cage, interbody fusion, anterior plating, Lumbar One to Lumbar Three percutaneous pedicle screws;  Surgeon: Charlie Pitter, MD;  Location: [facility];  Service: Neurosurgery;  Laterality: N/A;  . Tee without cardioversion N/A 12/25/2013    Procedure: TRANSESOPHAGEAL ECHOCARDIOGRAM (TEE);  Surgeon: Larey Dresser,  MD;  Location: [facility];  Service: Cardiovascular;  Laterality: N/A;  . Arch aortogram, left carotid,left subclavian angiogram  08-23-2014    Family History  Problem Relation Age of Onset  . Diabetes Paternal Grandmother     1 st degree relatives  . Heart disease Father   . Diabetes Brother   . Heart disease Brother   . Muscular dystrophy Brother   . Muscular dystrophy Sister   . Heart attack Father   . Hypertension Paternal Grandmother     History   Social History  . Marital Status: Divorced    Spouse Name: N/A    Number of Children: 1  . Years of Education: N/A   Occupational History  . Disabled    Social History Main Topics  . Smoking status: Former Smoker -- 1.00 packs/day for 48 years    Types: Cigarettes    Quit date: 09/23/2014  . Smokeless tobacco: Never Used     Comment: Started back x 2 weeks.  . Alcohol Use: No  . Drug Use: No     Comment: no recent use  . Sexual Activity: Not on file   Other Topics Concern  . Not on file   Social History Narrative    History  Smoking status  . Former Smoker -- 1.00 packs/day for 48 years  . Types: Cigarettes  . Quit date: 09/23/2014  Smokeless tobacco  . Never Used    Comment: Started back x 2 weeks.    History  Alcohol Use No     No Known Allergies  Current Facility-Administered Medications  Medication Dose Route Frequency Provider Last Rate Last Dose  . 0.9 %  sodium chloride infusion   Intravenous Continuous Elam Dutch, MD      . cefUROXime (ZINACEF) 1.5 g in dextrose 5 % 50 mL IVPB  1.5 g Intravenous 30 min Pre-Op Elam Dutch, MD      . Chlorhexidine Gluconate Cloth 2 % PADS 6 each  6 each Topical Once Elam Dutch, MD       And  . Chlorhexidine Gluconate Cloth 2 % PADS 6 each  6 each Topical Once Elam Dutch, MD         Review of Systems:     Cardiac Review of Systems: Y or N  Chest Pain [ n   ]  Resting SOB [ n  ] Exertional SOB  [  ]  Orthopnea [  ]   Pedal Edema [    ]    Palpitations [  ] Syncope  [  ]   Presyncope [   ]  General Review of Systems: [Y] = yes [  ]=no Constitional: recent weight change [  ];  Wt loss over the last 3 months [   ] anorexia [  ]; fatigue [  ]; nausea [  ]; night sweats [  ]; fever [  ]; or chills [  ];          Dental: poor dentition[  ]; Last Dentist visit:   Eye : blurred vision [  ]; diplopia [   ]; vision changes [  ];  Amaurosis fugax[  ]; Resp: cough [ y ];  wheezing[ y ];  hemoptysis[n ]; shortness of breath[ n ]; paroxysmal nocturnal dyspnea[  ]; dyspnea on exertion[ y ]; or orthopnea[  ];  GI:  gallstones[  ], vomiting[  ];  dysphagia[  ]; melena[  ];  hematochezia [  ]; heartburn[  ];   Hx of  Colonoscopy[  ]; GU: kidney stones [  ]; hematuria[  ];   dysuria [  ];  nocturia[  ];  history of     obstruction [  ]; urinary frequency [  ]             Skin: rash, swelling[  ];, hair loss[  ];  peripheral edema[  ];  or itching[  ]; Musculosketetal: myalgias[  ];  joint swelling[  ];  joint erythema[  ];  joint pain[  ];  back pain[chronic back and groin pain   ];  Heme/Lymph: bruising[  ];  bleeding[  ];  anemia[  ];  Neuro: TIA[ y right eye and left body ];  headaches[  ];  stroke[y  ];  vertigo[  ];  seizures[n  ];   paresthesias[  ];  difficulty walking[  ];  Psych:depression[ y ]; anxiety[ y ];  Endocrine: diabetes[ n ];  thyroid dysfunction[ n ];  Immunizations: Flu up to date [  ?]; Pneumococcal up to date [ ? ];  Other:  Physical Exam: BP 151/79 mmHg  Pulse 64  Temp(Src) 98.1 F (36.7 C) (Oral)  Resp 20  SpO2 95%  PHYSICAL EXAMINATION:  General appearance: alert, cooperative, appears older than stated age and no distress Neurologic: intact Heart: regular rate and rhythm, S1, S2 normal, no murmur, click, rub or gallop Lungs: diminished breath sounds bibasilar Abdomen: soft, non-tender; bowel sounds normal; no masses,  no organomegaly Extremities: extremities normal, atraumatic, no cyanosis or edema and  Homans sign is negative, no sign of DVT Patient has absent right radial pulse there is a high-pitched bruit of the right subclavian artery Has palpable DP and PT pulses bilaterally as large soft tissue mass in the left palm which he notes is related to a previous accident with a drill.  Diagnostic Studies & Laboratory data:     Recent Radiology Findings:   No results found.    Recent Lab Findings: Lab Results  Component Value Date   WBC 10.2 10/04/2014   HGB 12.4* 10/04/2014   HCT 37.1* 10/04/2014   PLT 213 10/04/2014   GLUCOSE 119* 10/04/2014   CHOL 121 12/22/2013   TRIG 142 12/22/2013   HDL 41 12/22/2013   LDLDIRECT 109* 03/16/2012   LDLCALC 52 12/22/2013   ALT 20 10/04/2014   AST 18 10/04/2014   NA 141 10/04/2014  K 3.4* 10/04/2014   CL 102 10/04/2014   CREATININE 0.76 10/04/2014   BUN 9 10/04/2014   CO2 22 10/04/2014   TSH 1.095 03/12/2013   INR 1.03 10/04/2014   HGBA1C 5.9* 12/21/2013      Assessment / Plan:   I reviewed the patient's recent arch aortogram and agree with Dr. Oneida Alar assessment proceeding with innominate to subclavian/carotid bypass because of the high-grade innominate stenosis, this is likely will require at least partial sternotomy and possible full to have adequate proximal control. I have discussed this in detail with the patient. In addition I discussed with him the risks and options of such surgeries particularly related to stroke and sternal wound infection. I've encouraged him to stop smoking. He will stop his Lovaza  and Mobic now and will stop his Plavix prior to surgery when instructed to do so by  Dr. Oneida Alar. Recent cardiology workup including cardiac catheterization showed no need for coronary artery bypass grafting.     Active Ambulatory Problems    Diagnosis Date Noted  . Hyperlipemia 09/20/2006  . TOBACCO ABUSE 09/20/2006  . Essential hypertension 09/20/2006  . VOCAL CORD DISORDER 09/20/2006  . COPD (chronic obstructive pulmonary  disease) 12/24/2009  . GERD 09/20/2006  . SHOULDER PAIN, RIGHT 12/13/2006  . History of colon polyps 05/18/2012  . Compression fracture of L2, with back and right groin pain 07/18/2013  . Hematuria 08/06/2013  . 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 Unequal blood pressure in upper extremities 04/10/2014  . CVA (cerebral infarction) 04/10/2014  . CAD (coronary artery disease) 07/10/2014  . Occlusion and stenosis of carotid artery without mention of cerebral infarction 07/25/2014  . Subclavian steal syndrome 07/25/2014  . Contusion of finger with damage to nail 08/12/2014  . Preventative health care 08/12/2014  . PAD (peripheral artery disease) 08/23/2014  . Pre-operative cardiovascular examination 08/28/2014  . 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 Innominate artery stenosis 09/19/2014   Resolved Ambulatory Problems    Diagnosis Date Noted  . DEPENDENCE, COCAINE, UNSPECIFIED 09/20/2006  . Back pain, lumbosacral 03/17/2012  . Lumbar radiculopathy 10/16/2013  . Lumbar compression fracture 12/18/2013   Past Medical History  Diagnosis Date  . Hyperlipidemia   . Hypertension   . GERD (gastroesophageal reflux disease)   . Tobacco abuse   . Disorder of vocal cord   . Cocaine dependence   . Right shoulder pain   . Arthritis   . Anxiety   . Compression fracture   . Fatty liver   . Hx of cardiovascular stress test    The goals risks and alternatives of the planned surgical procedure AORTA -INNOMIATE BYPASS to subclavian bypass with possible sternotomy partial/complete   have been discussed with the patient in detail. The risks of the procedure including death, infection, stroke, myocardial infarction, bleeding, blood transfusion have all been discussed specifically.  I have quoted Bradley Sawyer a 1 % of perioperative mortality and a complication rate as high as 20 %. The patient's questions have been answered.ISAHIA Sawyer is willing   to proceed with the planned procedure.    Bradley Isaac MD      [address] [place] [address] Office [phone]   Beeper [phone]  10/15/2014 7:22 AM

## 2014-10-16 ENCOUNTER — Encounter (HOSPITAL_COMMUNITY): Payer: Self-pay | Admitting: Vascular Surgery

## 2014-10-16 ENCOUNTER — Inpatient Hospital Stay (HOSPITAL_COMMUNITY): Payer: Medicaid Other

## 2014-10-16 LAB — BASIC METABOLIC PANEL
ANION GAP: 13 (ref 5–15)
BUN: 11 mg/dL (ref 6–23)
CO2: 26 meq/L (ref 19–32)
Calcium: 8.6 mg/dL (ref 8.4–10.5)
Chloride: 99 mEq/L (ref 96–112)
Creatinine, Ser: 0.73 mg/dL (ref 0.50–1.35)
GFR calc Af Amer: >90 mL/min (ref >90)
GFR calc non Af Amer: >90 mL/min (ref >90)
Glucose, Bld: 106 mg/dL — ABNORMAL HIGH (ref 70–99)
Potassium: 3.8 mEq/L (ref 3.7–5.3)
SODIUM: 138 meq/L (ref 137–147)

## 2014-10-16 LAB — CBC
HCT: 32.8 % — ABNORMAL LOW (ref 39.0–52.0)
Hemoglobin: 10.8 g/dL — ABNORMAL LOW (ref 13.0–17.0)
MCH: 30.7 pg (ref 26.0–34.0)
MCHC: 32.9 g/dL (ref 30.0–36.0)
MCV: 93.2 fL (ref 78.0–100.0)
PLATELETS: 222 10*3/uL (ref 150–400)
RBC: 3.52 MIL/uL — AB (ref 4.22–5.81)
RDW: 12.7 % (ref 11.5–15.5)
WBC: 15.2 10*3/uL — AB (ref 4.0–10.5)

## 2014-10-16 MED ORDER — OMEGA-3-ACID ETHYL ESTERS 1 G PO CAPS
2.0000 g | ORAL_CAPSULE | Freq: Two times a day (BID) | ORAL | Status: DC
  Administered 2014-10-16 – 2014-10-20 (×8): 2 g via ORAL
  Filled 2014-10-16 (×9): qty 2

## 2014-10-16 MED ORDER — MOMETASONE FURO-FORMOTEROL FUM 200-5 MCG/ACT IN AERO
2.0000 | INHALATION_SPRAY | Freq: Two times a day (BID) | RESPIRATORY_TRACT | Status: DC
  Administered 2014-10-16 – 2014-10-20 (×8): 2 via RESPIRATORY_TRACT
  Filled 2014-10-16: qty 8.8

## 2014-10-16 MED ORDER — ALBUTEROL SULFATE (2.5 MG/3ML) 0.083% IN NEBU
3.0000 mL | INHALATION_SOLUTION | Freq: Four times a day (QID) | RESPIRATORY_TRACT | Status: DC | PRN
  Administered 2014-10-18: 3 mL via RESPIRATORY_TRACT
  Filled 2014-10-16: qty 3

## 2014-10-16 MED ORDER — LISINOPRIL-HYDROCHLOROTHIAZIDE 20-12.5 MG PO TABS: 1.0000 | ORAL_TABLET | Freq: Every day | ORAL | Status: DC

## 2014-10-16 MED ORDER — LISINOPRIL 20 MG PO TABS
20.0000 mg | ORAL_TABLET | Freq: Every day | ORAL | Status: DC
  Administered 2014-10-16 – 2014-10-20 (×5): 20 mg via ORAL
  Filled 2014-10-16 (×5): qty 1

## 2014-10-16 MED ORDER — FLUOXETINE HCL 10 MG PO CAPS
10.0000 mg | ORAL_CAPSULE | Freq: Every day | ORAL | Status: DC
  Administered 2014-10-16 – 2014-10-20 (×5): 10 mg via ORAL
  Filled 2014-10-16 (×5): qty 1

## 2014-10-16 MED ORDER — HYDROCHLOROTHIAZIDE 12.5 MG PO CAPS
12.5000 mg | ORAL_CAPSULE | Freq: Every day | ORAL | Status: DC
  Administered 2014-10-16 – 2014-10-20 (×5): 12.5 mg via ORAL
  Filled 2014-10-16 (×5): qty 1

## 2014-10-16 MED ORDER — PRAVASTATIN SODIUM 20 MG PO TABS
20.0000 mg | ORAL_TABLET | Freq: Every day | ORAL | Status: DC
  Administered 2014-10-16 – 2014-10-20 (×5): 20 mg via ORAL
  Filled 2014-10-16 (×5): qty 1

## 2014-10-16 MED ORDER — OXYCODONE HCL 5 MG PO TABS
15.0000 mg | ORAL_TABLET | ORAL | Status: DC | PRN
  Administered 2014-10-16 – 2014-10-20 (×20): 15 mg via ORAL
  Filled 2014-10-16 (×21): qty 3

## 2014-10-16 MED ORDER — AMLODIPINE BESYLATE 10 MG PO TABS
10.0000 mg | ORAL_TABLET | Freq: Every day | ORAL | Status: DC
  Administered 2014-10-16 – 2014-10-20 (×5): 10 mg via ORAL
  Filled 2014-10-16 (×5): qty 1

## 2014-10-16 MED ORDER — GABAPENTIN 400 MG PO CAPS
800.0000 mg | ORAL_CAPSULE | Freq: Three times a day (TID) | ORAL | Status: DC
  Administered 2014-10-16 – 2014-10-20 (×13): 800 mg via ORAL
  Filled 2014-10-16 (×16): qty 2

## 2014-10-16 MED ORDER — METOCLOPRAMIDE HCL 5 MG/ML IJ SOLN
10.0000 mg | Freq: Three times a day (TID) | INTRAMUSCULAR | Status: AC
  Administered 2014-10-16 (×3): 10 mg via INTRAVENOUS
  Filled 2014-10-16 (×3): qty 2

## 2014-10-16 MED ORDER — PNEUMOCOCCAL VAC POLYVALENT 25 MCG/0.5ML IJ INJ
0.5000 mL | INJECTION | INTRAMUSCULAR | Status: DC
  Filled 2014-10-16: qty 0.5

## 2014-10-16 MED FILL — Thrombin For Soln 20000 Unit: CUTANEOUS | Qty: 1 | Status: AC

## 2014-10-16 NOTE — Progress Notes (Signed)
Informed PA about patient's pain issues last night, PA will talk to MD about alterative pain meds.

## 2014-10-16 NOTE — Progress Notes (Addendum)
  [facility] Progress Note  10/16/2014 7:07 AM 1 Day Post-Op  Subjective:  Having pain at surgical site with chronic back pain and neck pain. Pain poorly controlled.   Tmax 99.2 BP sys (right arm) 60s-140s 02 97% 3L  Filed Vitals:   10/16/14 0405  BP:   Pulse:   Temp: 98.2 F (36.8 C)  Resp:     Physical Exam: General: alert and oriented, appears uncomfortable, in no acute distress.  Incisions:  Sternal dressing dressed. JP drain with serosanguinous drainage.  Extremities:  2+ right radial pulse. Moving all extremities equally. 5/5 strength upper and lower extremities bilaterally. Feet are warm bilaterally.  Cardiac: regular, nml s1 s2 Lungs: rhonchi bilaterally  CBC    Component Value Date/Time   WBC 15.2* 10/16/2014 0344   RBC 3.52* 10/16/2014 0344   HGB 10.8* 10/16/2014 0344   HCT 32.8* 10/16/2014 0344   PLT 222 10/16/2014 0344   MCV 93.2 10/16/2014 0344   MCH 30.7 10/16/2014 0344   MCHC 32.9 10/16/2014 0344   RDW 12.7 10/16/2014 0344   LYMPHSABS 3.4 09/10/2014 1042   MONOABS 0.8 09/10/2014 1042   EOSABS 0.1 09/10/2014 1042   BASOSABS 0.0 09/10/2014 1042    BMET    Component Value Date/Time   NA 138 10/16/2014 0344   K 3.8 10/16/2014 0344   CL 99 10/16/2014 0344   CO2 26 10/16/2014 0344   GLUCOSE 106* 10/16/2014 0344   BUN 11 10/16/2014 0344   CREATININE 0.73 10/16/2014 0344   CREATININE 1.01 03/12/2013 1111   CALCIUM 8.6 10/16/2014 0344   GFRNONAA >90 10/16/2014 0344   GFRNONAA 83 03/12/2013 1111   GFRAA >90 10/16/2014 0344   GFRAA >89 03/12/2013 1111    INR    Component Value Date/Time   INR 1.03 10/04/2014 1000     Intake/Output Summary (Last 24 hours) at 10/16/14 0707 Last data filed at 10/16/14 0600  Gross per 24 hour  Intake 3787.5 ml  Output   3125 ml  Net  662.5 ml     Assessment:  58 y.o. male is s/p: endarterectomy of innominate, right common and right subclavian arteries with Dacron patch angioplasty;  partial sternotomy and plating of sternum   1 Day Post-Op  Plan: -Neuro intact. 2+ right radial pulse. BP stable in right arm -JP drain with 120 cc output since 2000 yesterday. Will keep in today.  -Acute surgical blood loss anemia: Hgb 10.8, down from 12.4 last pm. Will monitor.  -Poor pain control. Requiring morphine q hour. Has history of chronic back and neck pain. Will discuss starting toradol with attending.  -Sternotomy per CT surgery.  -Creatinine stable. -D/C foley. Has not ambulated. Mobilize. IS.  -Restarted on home nebs and ICS.  -Dispo: Will discuss with attending.   Virgina Jock, PA-C [facility] Office: [phone] Pager: [phone] 10/16/2014 7:07 AM     Addendum  I have independently interviewed and examined the patient, and I agree with the physician assistant's findings.  Pt with palpable radial and neuro intact.  Leave drain per CTS.  Would not use toradol for pain control at this point.  Will up narcotics due to likely tolerance.  Ok to transfer to stepdown ICU.  Would watch one more day in unit setting given increasing oxygen demand with increasing narcotic use.  Adele Barthel, MD [facility] [place] Office: [phone] Pager: [phone]  10/16/2014, 10:10 AM

## 2014-10-16 NOTE — Progress Notes (Signed)
Pt arrived to unit via wheelchair accompanied by RN and tech, along with pt belongings. Pt transferred from wheelchair to recliner. No s/s of acute distress noted, VS stable.

## 2014-10-16 NOTE — Progress Notes (Signed)
Patient ID: Bradley Sawyer, male   DOB: June 24, 1956, 58 y.o.   MRN: [id number] TCTS DAILY ICU PROGRESS NOTE                   [address]            [place] [address]          [phone]   1 Day Post-Op Procedure(s) (LRB): ENDARTERCTOMY OF  INNOMINATE ARTERY  (N/A) PATCH ANGIOPLASTY OF RIGHT SUBCLAVIAN ARTERY , RIGHT COMMON CAROTID ARTERY & INNOMINATE ARTERY (Right) ENDARTERECTOMY RIGHT SUBCLAVIAN ARTERY (Right) ENDARTERECTOMY RIGHT COMMON CAROTID ARTERY (Right) PARTIAL STERNOTOMY & PLATING OF STERNUM (N/A)  Total Length of Stay:  LOS: 1 day   Subjective: Awake and neuro intact   Objective: Vital signs in last 24 hours: Temp:  [97.8 F (36.6 C)-99.2 F (37.3 C)] 98.2 F (36.8 C) (11/25 0405) Pulse Rate:  [72-105] 81 (11/25 0802) Cardiac Rhythm:  [-] Normal sinus rhythm (11/25 0802) Resp:  [12-24] 16 (11/25 0802) BP: (68-143)/(49-87) 130/87 mmHg (11/25 0802) SpO2:  [90 %-100 %] 99 % (11/25 0802) Arterial Line BP: (89-184)/(48-86) 161/64 mmHg (11/25 0300) FiO2 (%):  [50 %] 50 % (11/24 1314)  There were no vitals filed for this visit.  Weight change:    Hemodynamic parameters for last 24 hours:    Intake/Output from previous day: 11/24 0701 - 11/25 0700 In: 3862.5 [I.V.:3812.5; IV Piggyback:50] Out: 3225 [Urine:2780; Drains:145; Blood:300]  Intake/Output this shift:    Current Meds: Scheduled Meds: . antiseptic oral rinse  7 mL Mouth Rinse BID  . aspirin EC  325 mg Oral Daily  . cefUROXime (ZINACEF)  IV  1.5 g Intravenous Once  . cefUROXime (ZINACEF)  IV  1.5 g Intravenous Q12H  . docusate sodium  100 mg Oral Daily  . gabapentin  800 mg Oral TID  . metoCLOPramide (REGLAN) injection  10 mg Intravenous 3 times per day  . mometasone-formoterol  2 puff Inhalation BID  . pantoprazole  40 mg Oral Daily  . pravastatin  20 mg Oral Daily   Continuous Infusions: . dextrose 5 % and 0.45% NaCl 75 mL/hr at 10/16/14 0317   PRN Meds:.acetaminophen  **OR** acetaminophen, albuterol, alum & mag hydroxide-simeth, guaiFENesin-dextromethorphan, hydrALAZINE, labetalol, magnesium sulfate 1 - 4 g bolus IVPB, metoprolol, morphine injection, ondansetron, oxyCODONE, phenol, potassium chloride  General appearance: alert and cooperative Neurologic: intact Heart: regular rate and rhythm, S1, S2 normal, no murmur, click, rub or gallop Lungs: rhonchi bilaterally and wheezes bilaterally Abdomen: mildly distended with hypoactive bowel sounds Extremities: extremities normal, atraumatic, no cyanosis or edema and Homans sign is negative, no sign of DVT Wound: sternum intact Rt arm neuro vascular intact  Lab Results: CBC: Recent Labs  10/16/14 0344  WBC 15.2*  HGB 10.8*  HCT 32.8*  PLT 222   BMET:  Recent Labs  10/16/14 0344  NA 138  K 3.8  CL 99  CO2 26  GLUCOSE 106*  BUN 11  CREATININE 0.73  CALCIUM 8.6    PT/INR: No results for input(s): LABPROT, INR in the last 72 hours. Radiology: Dg Chest Port 1 View  10/16/2014   CLINICAL DATA:  58 year old male status post vascular surgery.  Recent endarterectomy of the innominate artery with angioplasty of right subclavian artery, right common carotid artery, endarterectomy right subclavian artery and right common carotid artery. Operation 10/15/2014.  EXAM: PORTABLE CHEST - 1 VIEW  COMPARISON:  08/24/2013, prior CT of the chest 07/01/2013  FINDINGS: The  diameter of the cardiac silhouette is unchanged, within normal limits.  The superior mediastinum measures enlarged from the comparison study, with poor definition of the aortic arch and superior left heart border.  Surgical changes of median sternotomy/sternal plating, compatible with the given history.  Surgical drain at the thoracic inlet.  Left IJ central venous catheter, appears to terminate near the confluence of the brachiocephalic vein and superior vena cava.  Lung volumes are low.  No large pleural effusion or pneumothorax.  Coarsened  interstitial markings with interlobular septal thickening. No confluent airspace disease.  IMPRESSION: Early postoperative changes of vascular surgery of the branch vessels, compatible with the given history.  Superior mediastinum is widened compared to the prior plain film. This may be projectional or related to the recent surgery, however, persisting mediastinal hematoma or seroma could also have this appearance.  Coarsened interstitial markings, and interlobular septal thickening may represent early interstitial edema. No confluent airspace disease or large pleural effusion.  Mediastinal drain and left IJ central catheter in place.  These results were called by telephone at the time of interpretation on 10/16/2014 at 8:16 am to the Nurse caring for the patient, Ms Adelfa Koh , who verbally acknowledged these results.  Signed,  Dulcy Fanny. Earleen Newport, DO  [facility]  [facility]   Electronically Signed   By: Corrie Mckusick D.O.   On: 10/16/2014 08:21     Assessment/Plan: S/P Procedure(s) (LRB): ENDARTERCTOMY OF  INNOMINATE ARTERY  (N/A) PATCH ANGIOPLASTY OF RIGHT SUBCLAVIAN ARTERY , RIGHT COMMON CAROTID ARTERY & INNOMINATE ARTERY (Right) ENDARTERECTOMY RIGHT SUBCLAVIAN ARTERY (Right) ENDARTERECTOMY RIGHT COMMON CAROTID ARTERY (Right) PARTIAL STERNOTOMY & PLATING OF STERNUM (N/A) Mobilize Leave neck drain one more day then d/c Expected Acute  Blood - loss Anemia Three doses of reglan , hold on advancing diet beyond clears for now   Bradley Sawyer 10/16/2014 8:38 AM

## 2014-10-17 ENCOUNTER — Encounter (HOSPITAL_COMMUNITY): Payer: Self-pay | Admitting: *Deleted

## 2014-10-17 ENCOUNTER — Inpatient Hospital Stay (HOSPITAL_COMMUNITY): Payer: Medicaid Other

## 2014-10-17 LAB — CBC
HEMATOCRIT: 33.4 % — AB (ref 39.0–52.0)
HEMOGLOBIN: 10.9 g/dL — AB (ref 13.0–17.0)
MCH: 31.1 pg (ref 26.0–34.0)
MCHC: 32.6 g/dL (ref 30.0–36.0)
MCV: 95.4 fL (ref 78.0–100.0)
Platelets: 209 10*3/uL (ref 150–400)
RBC: 3.5 MIL/uL — ABNORMAL LOW (ref 4.22–5.81)
RDW: 12.5 % (ref 11.5–15.5)
WBC: 17.9 10*3/uL — ABNORMAL HIGH (ref 4.0–10.5)

## 2014-10-17 LAB — BASIC METABOLIC PANEL
ANION GAP: 11 (ref 5–15)
BUN: 8 mg/dL (ref 6–23)
CHLORIDE: 95 meq/L — AB (ref 96–112)
CO2: 28 mEq/L (ref 19–32)
Calcium: 8.6 mg/dL (ref 8.4–10.5)
Creatinine, Ser: 0.65 mg/dL (ref 0.50–1.35)
GFR calc Af Amer: >90 mL/min (ref >90)
GFR calc non Af Amer: >90 mL/min (ref >90)
GLUCOSE: 118 mg/dL — AB (ref 70–99)
Potassium: 3.8 mEq/L (ref 3.7–5.3)
SODIUM: 134 meq/L — AB (ref 137–147)

## 2014-10-17 MED ORDER — PANTOPRAZOLE SODIUM 40 MG PO TBEC
40.0000 mg | DELAYED_RELEASE_TABLET | Freq: Every day | ORAL | Status: DC
  Administered 2014-10-18 – 2014-10-20 (×3): 40 mg via ORAL
  Filled 2014-10-17 (×3): qty 1

## 2014-10-17 MED ORDER — GUAIFENESIN ER 600 MG PO TB12
600.0000 mg | ORAL_TABLET | Freq: Two times a day (BID) | ORAL | Status: DC
  Administered 2014-10-17 – 2014-10-20 (×6): 600 mg via ORAL
  Filled 2014-10-17 (×9): qty 1

## 2014-10-17 MED ORDER — MELOXICAM 15 MG PO TABS
15.0000 mg | ORAL_TABLET | Freq: Every day | ORAL | Status: DC
  Administered 2014-10-17 – 2014-10-20 (×4): 15 mg via ORAL
  Filled 2014-10-17 (×4): qty 1

## 2014-10-17 MED ORDER — CLOPIDOGREL BISULFATE 75 MG PO TABS
75.0000 mg | ORAL_TABLET | Freq: Every day | ORAL | Status: DC
Start: 2014-10-17 — End: 2014-10-20
  Administered 2014-10-17 – 2014-10-20 (×4): 75 mg via ORAL
  Filled 2014-10-17 (×4): qty 1

## 2014-10-17 MED ORDER — NICOTINE 21 MG/24HR TD PT24
21.0000 mg | MEDICATED_PATCH | Freq: Every day | TRANSDERMAL | Status: DC
Start: 2014-10-17 — End: 2014-10-20
  Administered 2014-10-18: 21 mg via TRANSDERMAL
  Filled 2014-10-17 (×4): qty 1

## 2014-10-17 MED ORDER — DOCUSATE SODIUM 100 MG PO CAPS
100.0000 mg | ORAL_CAPSULE | Freq: Two times a day (BID) | ORAL | Status: DC
  Administered 2014-10-17 – 2014-10-20 (×6): 100 mg via ORAL
  Filled 2014-10-17 (×7): qty 1

## 2014-10-17 MED ORDER — NITROGLYCERIN 0.4 MG SL SUBL: 0.4000 mg | SUBLINGUAL_TABLET | SUBLINGUAL | Status: DC | PRN

## 2014-10-17 MED ORDER — POLYETHYLENE GLYCOL 3350 17 G PO PACK
17.0000 g | PACK | Freq: Every day | ORAL | Status: DC | PRN
  Administered 2014-10-19: 17 g via ORAL
  Filled 2014-10-17 (×2): qty 1

## 2014-10-17 MED ORDER — OXYCODONE HCL ER 10 MG PO T12A
10.0000 mg | EXTENDED_RELEASE_TABLET | Freq: Two times a day (BID) | ORAL | Status: DC
  Administered 2014-10-17 – 2014-10-20 (×7): 10 mg via ORAL
  Filled 2014-10-17 (×7): qty 1

## 2014-10-17 MED ORDER — PNEUMOCOCCAL VAC POLYVALENT 25 MCG/0.5ML IJ INJ: 0.5000 mL | INJECTION | INTRAMUSCULAR | Status: DC | PRN

## 2014-10-17 MED ORDER — ATORVASTATIN CALCIUM 20 MG PO TABS: 20.0000 mg | ORAL_TABLET | Freq: Every day | ORAL | Status: DC

## 2014-10-17 NOTE — Progress Notes (Addendum)
   Daily Progress Note  Assessment/Planning: POD #2 s/p EA R innominate, CCA and SCA   Drain mgmt per CTS  IS x 10 q1  Keep in stepdown one more day while increasing pain meds given suboptimal respiratory status currently  Continue pain rx titration (pt baseline chronic pain): oxycontin 10 mg 1 PO BID added  Subjective  - 2 Days Post-Op  C/o L elbow and shoulder pain  Objective Filed Vitals:   10/17/14 0138 10/17/14 0200 10/17/14 0251 10/17/14 0300  BP: 129/78  122/74   Pulse: 96 83 88 85  Temp:   98.9 F (37.2 C)   TempSrc:   Oral   Resp: 20 15 16 14   SpO2: 91% 92% 92% 93%    Intake/Output Summary (Last 24 hours) at 10/17/14 0739 Last data filed at 10/17/14 0400  Gross per 24 hour  Intake 1878.75 ml  Output   2260 ml  Net -381.25 ml    PULM  BLL rales, substernal drain 60 cc serosang CV  RRR GI  soft, NTND VASC  Palpable R radial NEURO M/S 5/5 sym, CN gross intact  Laboratory CBC    Component Value Date/Time   WBC 17.9* 10/17/2014 0622   HGB 10.9* 10/17/2014 0622   HCT 33.4* 10/17/2014 0622   PLT 209 10/17/2014 0622    BMET    Component Value Date/Time   NA 134* 10/17/2014 0622   K 3.8 10/17/2014 0622   CL 95* 10/17/2014 0622   CO2 28 10/17/2014 0622   GLUCOSE 118* 10/17/2014 0622   BUN 8 10/17/2014 0622   CREATININE 0.65 10/17/2014 0622   CREATININE 1.01 03/12/2013 1111   CALCIUM 8.6 10/17/2014 0622   GFRNONAA >90 10/17/2014 0622   GFRNONAA 83 03/12/2013 1111   GFRAA >90 10/17/2014 0622   GFRAA >89 03/12/2013 1111    Adele Barthel, MD [facility] [place] Office: [phone] Pager: [phone]  10/17/2014, 7:39 AM

## 2014-10-17 NOTE — Progress Notes (Addendum)
[address]       [place] [address]             [phone]          2 Days Post-Op Procedure(s) (LRB): ENDARTERCTOMY OF  INNOMINATE ARTERY  (N/A) PATCH ANGIOPLASTY OF RIGHT SUBCLAVIAN ARTERY , RIGHT COMMON CAROTID ARTERY & INNOMINATE ARTERY (Right) ENDARTERECTOMY RIGHT SUBCLAVIAN ARTERY (Right) ENDARTERECTOMY RIGHT COMMON CAROTID ARTERY (Right) PARTIAL STERNOTOMY & PLATING OF STERNUM (N/A)  Subjective: Having a lot of left shoulder pain.  Feels bloated, but passing flatus.   Objective: Vital signs in last 24 hours: Patient Vitals for the past 24 hrs:  BP Temp Temp src Pulse Resp SpO2  10/17/14 0300 - - - 85 14 93 %  10/17/14 0251 122/74 mmHg 98.9 F (37.2 C) Oral 88 16 92 %  10/17/14 0200 - - - 83 15 92 %  10/17/14 0138 129/78 mmHg - - 96 20 91 %  10/17/14 0100 - - - 88 17 91 %  10/17/14 0000 - - - 89 14 91 %  10/16/14 2347 - - - 88 19 91 %  10/16/14 2341 99/70 mmHg 98.5 F (36.9 C) Oral 96 15 91 %  10/16/14 2201 (!) 145/83 mmHg - - - - -  10/16/14 1956 132/81 mmHg - - - - -  10/16/14 1936 132/81 mmHg 98.8 F (37.1 C) Oral 95 (!) 22 95 %  10/16/14 1813 134/86 mmHg - - 93 19 95 %  10/16/14 1700 (!) 143/78 mmHg - - 85 16 96 %  10/16/14 1600 (!) 141/87 mmHg - - 82 18 97 %  10/16/14 1500 120/76 mmHg - - 81 14 96 %  10/16/14 1400 (!) 150/79 mmHg - - 88 (!) 24 95 %  10/16/14 1300 (!) 145/80 mmHg - - 81 15 95 %  10/16/14 1255 (!) 141/81 mmHg - - 86 20 96 %  10/16/14 1200 - 98.2 F (36.8 C) Oral - - -  10/16/14 1100 - - - 81 14 96 %  10/16/14 1007 - - - - - 96 %  10/16/14 0900 (!) 153/85 mmHg - - 81 18 97 %   Current Weight  10/04/14 226 lb 13.7 oz (102.9 kg)     Intake/Output from previous day: 11/25 0701 - 11/26 0700 In: 1878.8 [P.O.:300; I.V.:1578.8] Out: 2260 [Urine:2200; Drains:60]    PHYSICAL EXAM:  Heart: RRR Lungs: Coarse rhonchi bilaterally Wound: Dressed and dry Neuro: Intact    Lab Results: CBC: Recent Labs  10/16/14 0344 10/17/14 0622  WBC 15.2* 17.9*  HGB 10.8* 10.9*  HCT 32.8* 33.4*  PLT 222 209   BMET:  Recent Labs  10/16/14 0344 10/17/14 0622  NA 138 134*  K 3.8 3.8  CL 99 95*  CO2 26 28  GLUCOSE 106* 118*  BUN 11 8  CREATININE 0.73 0.65  CALCIUM 8.6 8.6    PT/INR: No results for input(s): LABPROT, INR in the last 72 hours.  CXR: FINDINGS: Left IJ central line tip overlies the level of superior vena cava. Surgical drain overlies the thoracic inlet.  There is probable right apical pneumothorax, estimated to be 5-10% of lung volume. Heart size is enlarged. Minimal left lower lobe atelectasis. Upper mediastinum appears widened and stable compared most recent study.  IMPRESSION: 1. Right apical pneumothorax, 5-10% of lung volume. 2. Cardiomegaly. 3. Left lower lobe atelectasis. 4. Lines and tubes as described. 5. Critical Value/emergent results were called by telephone at  the time of interpretation on 10/17/2014 at 9:15 am to , the patient's nurse, Loree Fee, who verbally acknowledged these results. She will follow-up with the patient's team  Assessment/Plan: S/P Procedure(s) (LRB): ENDARTERCTOMY OF  INNOMINATE ARTERY  (N/A) PATCH ANGIOPLASTY OF RIGHT SUBCLAVIAN ARTERY , RIGHT COMMON CAROTID ARTERY & INNOMINATE ARTERY (Right) ENDARTERECTOMY RIGHT SUBCLAVIAN ARTERY (Right) ENDARTERECTOMY RIGHT COMMON CAROTID ARTERY (Right) PARTIAL STERNOTOMY & PLATING OF STERNUM (N/A)  Pain control seems to be primary issue.  He has arthritis and takes Mobic, also has chronic pain.  VVS has restarted home Oxycontin, also has prn Oxycodone.    Drain output scant.  Will d/c JP.  Pulm- needs increased pulm toilet.  Will add flutter valve and Mucinex for secretions. Continue home COPD meds.  Leukocytosis, no fever.  Likely atelectasis.Continue to monitor.   LOS: 2 days    COLLINS,GINA H 10/17/2014  Patient seen and examined Primary complaint is left shoulder pain- new since  surgery. + pain with PROM Will resume mobic in addition to narcotics

## 2014-10-17 NOTE — Op Note (Signed)
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  PROCEDURE:  Partial sternotomy for exposure of innominate artery and common carotid artery and subclavian artery with endarterectomy of innominate artery, subclavian artery, and common carotid artery with patch angioplasty.  Plating of sternum.  SURGEON:  Lanelle Bal, MD, Thoracic Surgery.  CO-SURGEON:  [place] Fields, MD, Vascular Surgery.  BRIEF HISTORY:  The patient is a 58 year old male who suffered a stroke following an orthopedic procedure, which resolved quickly.  Evaluation revealed a mid-innominate artery stenosis of 95% extending to the common carotid with subtotal occlusion of the right subclavian artery. Subclavian and carotid endarterectomy and/or bypass was recommended to the patient.  Cardiac catheterization confirmed that he had no coronary artery disease.  The procedure was done with co-surgeons, myself, and Dr. Oneida Alar.  Risks and options of surgery were explained to the patient, particularly risk of stroke.  The patient agreed and signed informed consent.  DESCRIPTION OF PROCEDURE:  With left central line and arterial line in place, the patient underwent general endotracheal anesthesia.  The skin of the chest and neck were prepped and draped in usual sterile manner. Appropriate time-out was performed.  Procedure dictated under this notice, the exposure of the innominate artery.  Subclavian  artery procedure was dictated by Dr. Oneida Alar is the endarterectomy.  We started with a sternal-splitting incision dividing the upper sternum to approximately the third intercostal space and then teeing to the right. Small retractor was placed and the partial sternotomy was opened.  We were able to identify the pericardium.  The innominate vein was dissected carefully as to not be torn, and it was encircled with a vessel loop for mobility.  The proximal innominate artery as it comes, the aorta was identified and dissection carried out superiorly to the takeoff of the common carotid artery on the right.  A Rultract was placed under the manubrium on the right elevating the upper manubrium and sternum to allow exposure of the subclavian artery distal to the takeoff of the vertebral artery.  Vessel loops for control were placed around the vertebral artery at its origin and the subclavian artery distal to it.  Care was taken to identify the phrenic nerve and the vagus nerve and the recurrent nerve as it traveled around the subclavian artery.  Each was preserved with the exposure adequate both for proximal and distal control.  The patient was then systemically heparinized. Under Dr. Oneida Alar' note, there is description of the endarterectomy and patch angioplasty of the innominate artery, origin of the common carotid, and subclavian artery.  With this completed and hemostatic, protamine was administered.  A small Blake drain was left in the substernal space.  The vessel loops were removed.  The mediastinal tissue was closed over the aorta.  Three sternal wires were placed in the manubrium and the sternum reapproximated.  An Universal Synthes sternal plate was then selected and placed vertically across the transverse cut on the sternum to stabilize this.  Each four 14 mm screws were used in the universal plate to secure it in place.  This provided a very stable closure of the upper sternum.  The  fascia was closed with interrupted 0-Vicryl, running 3-0 Vicryl in the subcutaneous tissue, 3-0 subcuticular stitch in skin edges.  Dermabond was applied and dressing was applied.  Estimated blood loss was approximately 150 mL.  The patient tolerated the procedure without obvious complications and was transferred to the Surgical Intensive Care Unit in stable condition, intubated for weaning as tolerated.     Lanelle Bal, MD     EG/MEDQ  D:  10/16/2014  T:  10/16/2014  Job:  [id number]

## 2014-10-18 ENCOUNTER — Inpatient Hospital Stay (HOSPITAL_COMMUNITY): Payer: Medicaid Other

## 2014-10-18 LAB — CBC
HEMATOCRIT: 31.8 % — AB (ref 39.0–52.0)
HEMOGLOBIN: 10.4 g/dL — AB (ref 13.0–17.0)
MCH: 30.7 pg (ref 26.0–34.0)
MCHC: 32.7 g/dL (ref 30.0–36.0)
MCV: 93.8 fL (ref 78.0–100.0)
Platelets: 200 10*3/uL (ref 150–400)
RBC: 3.39 MIL/uL — AB (ref 4.22–5.81)
RDW: 12.4 % (ref 11.5–15.5)
WBC: 18.8 10*3/uL — ABNORMAL HIGH (ref 4.0–10.5)

## 2014-10-18 LAB — BASIC METABOLIC PANEL
Anion gap: 12 (ref 5–15)
BUN: 19 mg/dL (ref 6–23)
CO2: 30 meq/L (ref 19–32)
Calcium: 8.6 mg/dL (ref 8.4–10.5)
Chloride: 93 mEq/L — ABNORMAL LOW (ref 96–112)
Creatinine, Ser: 0.87 mg/dL (ref 0.50–1.35)
GFR calc Af Amer: >90 mL/min (ref >90)
GFR calc non Af Amer: >90 mL/min (ref >90)
Glucose, Bld: 95 mg/dL (ref 70–99)
POTASSIUM: 3.7 meq/L (ref 3.7–5.3)
Sodium: 135 mEq/L — ABNORMAL LOW (ref 137–147)

## 2014-10-18 MED ORDER — ENOXAPARIN SODIUM 40 MG/0.4ML ~~LOC~~ SOLN
40.0000 mg | SUBCUTANEOUS | Status: DC
  Filled 2014-10-18: qty 0.4

## 2014-10-18 MED ORDER — MORPHINE SULFATE 2 MG/ML IJ SOLN
2.0000 mg | INTRAMUSCULAR | Status: DC | PRN
  Administered 2014-10-18 – 2014-10-20 (×4): 2 mg via INTRAVENOUS
  Filled 2014-10-18 (×4): qty 1

## 2014-10-18 MED ORDER — ENOXAPARIN SODIUM 60 MG/0.6ML ~~LOC~~ SOLN
50.0000 mg | SUBCUTANEOUS | Status: DC
  Administered 2014-10-18 – 2014-10-19 (×2): 50 mg via SUBCUTANEOUS
  Filled 2014-10-18 (×3): qty 0.6

## 2014-10-18 NOTE — Progress Notes (Addendum)
      [address]       [place] [address]             [phone]        3 Days Post-Op Procedure(s) (LRB): ENDARTERCTOMY OF  INNOMINATE ARTERY  (N/A) PATCH ANGIOPLASTY OF RIGHT SUBCLAVIAN ARTERY , RIGHT COMMON CAROTID ARTERY & INNOMINATE ARTERY (Right) ENDARTERECTOMY RIGHT SUBCLAVIAN ARTERY (Right) ENDARTERECTOMY RIGHT COMMON CAROTID ARTERY (Right) PARTIAL STERNOTOMY & PLATING OF STERNUM (N/A)  Subjective: Patient still with complaints of left shoulder pain.  Objective: Vital signs in last 24 hours: Temp:  [97.8 F (36.6 C)-99.2 F (37.3 C)] 98.9 F (37.2 C) (11/27 0700) Pulse Rate:  [79-102] 102 (11/27 0700) Cardiac Rhythm:  [-] Normal sinus rhythm (11/26 2000) Resp:  [12-21] 21 (11/27 0700) BP: (104-144)/(63-89) 114/66 mmHg (11/27 0700) SpO2:  [90 %-96 %] 90 % (11/27 0700)   Current Weight  10/04/14 226 lb 13.7 oz (102.9 kg)      Intake/Output from previous day: 11/26 0701 - 11/27 0700 In: 555 [P.O.:480; I.V.:75] Out: 950 [Urine:950]   Physical Exam:  Cardiovascular: RRR Pulmonary: Coarse breath sound bilaterally Abdomen: Soft, non tender, bowel sounds present. Extremities: Trace bilateral lower extremity edema. Left shoulder pain Wounds: Clean and dry.  No erythema or signs of infection.  Lab Results: CBC: Recent Labs  10/16/14 0344 10/17/14 0622  WBC 15.2* 17.9*  HGB 10.8* 10.9*  HCT 32.8* 33.4*  PLT 222 209   BMET:  Recent Labs  10/16/14 0344 10/17/14 0622  NA 138 134*  K 3.8 3.8  CL 99 95*  CO2 26 28  GLUCOSE 106* 118*  BUN 11 8  CREATININE 0.73 0.65  CALCIUM 8.6 8.6    PT/INR:  Lab Results  Component Value Date   INR 1.03 10/04/2014   INR 1.0 09/10/2014   ABG:  INR: Will add last result for INR, ABG once components are confirmed Will add last 4 CBG results once components are confirmed  Assessment/Plan:  1. CV - SR in the 90's this am. On Norvasc and Lisinopril as taken pre op 2.  Pulmonary - History  of COPD. Continue current meds. CXR appears to show small,stalbe right apical pneumothorax, atelectasis on left, small bilateral pleural effusions,gaseous bowel distention.Encourage incentive spirometer and flutter valve 4.  Acute blood loss anemia - Last H and H stable at 10.9 and 33.4 5. SCDs for DVT prophylaxis. Will discuss with surgeon if needs Lovenox  6. Pain control still an issue-continue with Mobic (history of arthritis), Oxycontin, Morphine PRN, and Oxy PRN  ZIMMERMAN,DONIELLE MPA-C 10/18/2014,7:46 AM  Patient seen and examined, agree with above Left shoulder is a little better today Will add lovenox

## 2014-10-18 NOTE — Progress Notes (Signed)
Pt given breathing tx at this time; sats now up to 93% 3L [place]; will cont. To monitor.

## 2014-10-18 NOTE — Progress Notes (Signed)
ANTICOAGULATION CONSULT NOTE - Initial Consult  Pharmacy Consult for Lovenox Indication: VTE prophylaxis  No Known Allergies  Patient Measurements: Height: 5' 10.47" (179 cm) Weight: 226 lb 13.7 oz (102.9 kg) IBW/kg (Calculated) : 74.09 Body mass index is 32.12 kg/(m^2).  Vital Signs: Temp: 99.7 F (37.6 C) (11/27 1017) Temp Source: Oral (11/27 1017) BP: 113/69 mmHg (11/27 1017) Pulse Rate: 93 (11/27 1017)  Labs:  Recent Labs  10/16/14 0344 10/17/14 0622 10/18/14 0620  HGB 10.8* 10.9* 10.4*  HCT 32.8* 33.4* 31.8*  PLT 222 209 200  CREATININE 0.73 0.65 0.87    Estimated Creatinine Clearance: 112.1 mL/min (by C-G formula based on Cr of 0.87).   Medical History: Past Medical History  Diagnosis Date  . Hyperlipidemia   . Hypertension   . GERD (gastroesophageal reflux disease)   . Tobacco abuse   . COPD (chronic obstructive pulmonary disease)     bronchitis  . Disorder of vocal cord   . Cocaine dependence     Last use 1998, attends 3 NA meetings weekly.  . Right shoulder pain     2/2 partial rotator cuff tear, tendinopathy, mild subacromial/subdeltioid bursitis, A/C joint arthropathy per MRI done in 3/07  . Arthritis   . Anxiety   . Compression fracture     L2  . CVA (cerebral infarction)   . Fatty liver   . Hx of cardiovascular stress test     ETT/Lexiscan-Myoview (10/15):  Inferior, inferoapical, apical lateral defect c/w mild ischemia, EF 66%; Intermediate Risk  . 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  SCDs for DVT prophylaxis since surgery on 11/25.  Surgery starting Lovenox for DVT prophylaxis  Acute blood loss from surgery, H&H low but stable, PLT WNL, no bleeding noted.  BMI > 30, will use lovenox prophylactic dosing for obesity  (0.5mg /kg/day) CrCl > 100 mL/min   Plan:  -Lovenox 50mg  daily -Monitor CBC, PLT, s/sx of bleeding -Pharmacy will sign off, please consult again if we can be of further help  Drucie Opitz, PharmD Clinical Pharmacy Resident Pager: [phone] 10/18/2014 10:22 AM

## 2014-10-18 NOTE — Progress Notes (Addendum)
Progress Note    10/18/2014 7:26 AM 3 Days Post-Op  Subjective:  Pt still having pain  Tm 99.2 now 98.9 93% 3LO2NC  Filed Vitals:   10/18/14 0403  BP: 120/63  Pulse: 85  Temp: 99.1 F (37.3 C)  Resp: 19    Physical Exam: Neuro:  In tact Extremities:  2+ right radial pulse   CBC    Component Value Date/Time   WBC 17.9* 10/17/2014 0622   RBC 3.50* 10/17/2014 0622   HGB 10.9* 10/17/2014 0622   HCT 33.4* 10/17/2014 0622   PLT 209 10/17/2014 0622   MCV 95.4 10/17/2014 0622   MCH 31.1 10/17/2014 0622   MCHC 32.6 10/17/2014 0622   RDW 12.5 10/17/2014 0622   LYMPHSABS 3.4 09/10/2014 1042   MONOABS 0.8 09/10/2014 1042   EOSABS 0.1 09/10/2014 1042   BASOSABS 0.0 09/10/2014 1042    BMET    Component Value Date/Time   NA 134* 10/17/2014 0622   K 3.8 10/17/2014 0622   CL 95* 10/17/2014 0622   CO2 28 10/17/2014 0622   GLUCOSE 118* 10/17/2014 0622   BUN 8 10/17/2014 0622   CREATININE 0.65 10/17/2014 0622   CREATININE 1.01 03/12/2013 1111   CALCIUM 8.6 10/17/2014 0622   GFRNONAA >90 10/17/2014 0622   GFRNONAA 83 03/12/2013 1111   GFRAA >90 10/17/2014 0622   GFRAA >89 03/12/2013 1111    INR    Component Value Date/Time   INR 1.03 10/04/2014 1000     Intake/Output Summary (Last 24 hours) at 10/18/14 0726 Last data filed at 10/18/14 0350  Gross per 24 hour  Intake    555 ml  Output    950 ml  Net   -395 ml     Assessment:  58 y.o. male is s/p:  Endarterectomy of innominate right common and right subclavian arteries with Dacron patch angioplasty  3 Days Post-Op  Plan: -Pain is more controlled today-has only received one dose of IV morphine in 24 hrs -continue pulmonary toilet and mobilization -drain out yesterday -leukocytosis yesterday-will order CBC for tomorrow. -DVT prophylaxis:  He is not on a DVT prophylaxis for high risk of bleeding s/p surgery.  If okay with CT surgery, start Lovenox.  (pt is also on Plavix). -transfer to telemetry  unit    Leontine Locket, [facility] [phone] 10/18/2014 7:26 AM    Addendum  I have independently interviewed and examined the patient, and I agree with the physician assistant's findings.  Neuro intact, JP out.  Small apical PTX, mgmt per CTS.  Ok to tsfr to floor. Stable oxygenation.  Continue oxygen to help reabsorption of PTX.  Tsf to floor.  Home once pain better, PTX issue resolved, and off oxygen.  PT/OT c/s   Radiology: Dg Chest 2 View  10/18/2014   CLINICAL DATA:  Status post open heart surgery  EXAM: CHEST  2 VIEW  COMPARISON:  PA and lateral chest x-ray of October 17, 2014  FINDINGS: The lungs are better inflated today. The small (10% or less) right apical pneumothorax is stable. There is minimal persistent left basilar atelectasis. There are persistent small bilateral pleural effusions layering posteriorly. The cardiac silhouette is top-normal in size. The pulmonary vascularity is more distinct. The left internal jugular venous catheter tip projects at the level of the junction of the right and left brachiocephalic veins.  IMPRESSION: 1. Stable right apical pneumothorax. 2. Improved appearance of the pulmonary interstitium consistent with resolving interstitial edema. 3. Small stable  bilateral pleural effusions layering posteriorly.   Electronically Signed   By: David  Martinique   On: 10/18/2014 08:06   Dg Chest 2 View  10/17/2014   CLINICAL DATA:  Sore chest status post thoracic surgery. Subclavian arterial stenosis. Left shoulder pain.  EXAM: CHEST  2 VIEW  COMPARISON:  10/16/2014  FINDINGS: Left IJ central line tip overlies the level of superior vena cava. Surgical drain overlies the thoracic inlet.  There is probable right apical pneumothorax, estimated to be 5-10% of lung volume. Heart size is enlarged. Minimal left lower lobe atelectasis. Upper mediastinum appears widened and stable compared most recent study.  IMPRESSION: 1. Right apical  pneumothorax, 5-10% of lung volume. 2. Cardiomegaly. 3. Left lower lobe atelectasis. 4. Lines and tubes as described. 5. Critical Value/emergent results were called by telephone at the time of interpretation on 10/17/2014 at 9:15 am to , the patient's nurse, Loree Fee, who verbally acknowledged these results. She will follow-up with the patient's team.   Electronically Signed   By: Shon Hale M.D.   On: 10/17/2014 09:18      Adele Barthel, MD [facility] Office: [phone] Pager: [phone]  10/18/2014, 9:39 AM

## 2014-10-18 NOTE — Progress Notes (Signed)
Temp 100.7; pt given 650 PO Tylenol; will cont. To monitor.

## 2014-10-18 NOTE — Plan of Care (Signed)
Problem: Phase I Progression Outcomes Goal: Pain controlled with appropriate interventions Outcome: Progressing     

## 2014-10-18 NOTE — Care Management Note (Signed)
    Page 1 of 2   10/20/2014     5:25:02 PM CARE MANAGEMENT NOTE 10/20/2014  Patient:  Bradley Sawyer, Bradley Sawyer   Account Number:  [id number]  Date Initiated:  10/15/2014  Documentation initiated by:  [facility]  Subjective/Objective Assessment:   Post op Carotid - on vent     Action/Plan:   Anticipated DC Date:  10/21/2014   Anticipated DC Plan:  [place]  CM consult      [facility] Choice  HOME HEALTH  DURABLE MEDICAL EQUIPMENT   Choice offered to / List presented to:  C-4 Adult Children   DME arranged  [facility]      DME agency  [place] arranged  HH-1 RN      [place].   Status of service:  Completed, signed off Medicare Important Message given?  NO (If response is "NO", the following Medicare IM given date fields will be blank) Date Medicare IM given:   Medicare IM given by:   Date Additional Medicare IM given:   Additional Medicare IM given by:    Discharge Disposition:  [place]  Per UR Regulation:  Reviewed for med. necessity/level of care/duration of stay  If discussed at [place] of Stay Meetings, dates discussed:    Comments:  10/20/14 10:21 CM met with pt in room to offer choice of home health agency.  Pt chooses [facility] to render [facility]. Address will be [address] [place], [place] and contact is still the pt's cell.  Referral called to [facility] with new address.  CM called [facility] DME rep to please deliver O2 prior to discharge.  No other Cm needs were communicated.  Opal Sidles, BSN, [place].  Contact:  Marcha Dutton [phone]      Riley Lam [phone]     Estill Cotta  [phone]    10/18/14- 1500- Marvetta Gibbons RN, BSN [phone] Spoke with pt and family (son-Bradley Sawyer and daugher/n/law -Bradley Sawyer) at bedside regarding potential d/c need- plan is for pt to return home with son- address: [address] [place], [place] [address]-   Bradley Sawyer and [place] #'s confirmed as above.  Pt has a RW at home- would like a 3n1- MD please place order- per PT notes - recommendation for [facility]- PT- pt and family would like [place]- list for [facility] agencies in [place] given to family- explained to family- that pt may not qualify for [facility] under Medicaid- they voiced understanding- but would like referral to see if pt may qualify- MD please place HH- order- NCM to f/u regarding agency of choice

## 2014-10-18 NOTE — Evaluation (Signed)
Physical Therapy Evaluation Patient Details Name: Bradley Sawyer MRN: [id number] DOB: 04-20-1956 Today's Date: 10/18/2014   History of Present Illness  58 y.o. male s/p ENDARTERCTOMY OF  INNOMINATE ARTERY, RIGHT SUBCLAVIAN ARTERY, and RIGHT COMMON CAROTID ARTERY. PARTIAL STERNOTOMY & PLATING OF STERNUM .  Clinical Impression  Patient is seen following the above procedure and presents with functional limitations due to the deficits listed below (see PT Problem List). Ambulates slowly with close guard up to 350 feet with a rolling walker for stability. SpO2 down to 87% while on 4L but improves to >90% with rest break and cues for pursed lip breathing. Reviewed sternal precautions with patient. He is adamant to return home with 24/7 care from family. Patient will benefit from skilled PT to increase their independence and safety with mobility to allow discharge to the venue listed below.       Follow Up Recommendations Home health PT;Supervision for mobility/OOB    Equipment Recommendations  3in1 (PT)    Recommendations for Other Services OT consult     Precautions / Restrictions Precautions Precautions: Sternal;Fall Precaution Comments: Reviewed sternal precautions Restrictions Weight Bearing Restrictions: Yes Other Position/Activity Restrictions: partial sternotomy      Mobility  Bed Mobility Overal bed mobility: Needs Assistance Bed Mobility: Rolling;Sidelying to Sit Rolling: Min assist Sidelying to sit: Min assist       General bed mobility comments: Min assist for positioning and truncal support with log roll technique. Cues to maintain sternal precautions  Transfers Overall transfer level: Needs assistance Equipment used: Rolling walker (2 wheeled) Transfers: Sit to/from Stand Sit to Stand: Min assist         General transfer comment: Min assist for boost to stand from lowest bed setting. Pt rocks several times in attempt to stand without assist. Good upright  posture once standing and holdling RW for support.  Ambulation/Gait Ambulation/Gait assistance: Min guard Ambulation Distance (Feet): 150 Feet Assistive device: Rolling walker (2 wheeled) Gait Pattern/deviations: Step-through pattern;Decreased stride length;Staggering left;Drifts right/left   Gait velocity interpretation: Below normal speed for age/gender General Gait Details: Educated on safe DME use with a rolling walker. Close guard for safety. Pt with mild balance difficulty drifting and staggers to left but able to self correct. denies dizziness. SpO2 on 4L dropped to 87% but quickly returns to >90% with standing rest break and cues for pursed lip breathing.  Stairs            Wheelchair Mobility    Modified Rankin (Stroke Patients Only)       Balance                                             Pertinent Vitals/Pain Pain Assessment: 0-10 Pain Score: 7  Pain Location: Left shoulder Pain Descriptors / Indicators: Constant Pain Intervention(s): Limited activity within patient's tolerance;Premedicated before session;Monitored during session  HR 98    Home Living Family/patient expects to be discharged to:: Private residence Living Arrangements: Alone Available Help at Discharge: Family;Available 24 hours/day (willl stay with son and daughter-in-law 24/7 care) Type of Home: House Home Access: Stairs to enter Entrance Stairs-Rails: Right Entrance Stairs-Number of Steps: 3 Home Layout: One level Home Equipment: Walker - 2 wheels;Cane - single point      Prior Function Level of Independence: Independent  Hand Dominance   Dominant Hand: Right    Extremity/Trunk Assessment   Upper Extremity Assessment: Defer to OT evaluation           Lower Extremity Assessment: Generalized weakness         Communication   Communication: No difficulties  Cognition Arousal/Alertness: Awake/alert Behavior During Therapy: WFL for  tasks assessed/performed Overall Cognitive Status: Within Functional Limits for tasks assessed                      General Comments General comments (skin integrity, edema, etc.): Pt greatest complaint is Lt shoulder pain. tender to palpation over acromion process and insertion site of rotator cuff. Unable to lift Lt arm to shoulder level due to pain. Discussed d/c planning and home living, concerning safety with mobility, appropriate care, and maintaining sternal precautions.    Exercises        Assessment/Plan    PT Assessment Patient needs continued PT services  PT Diagnosis Difficulty walking;Abnormality of gait;Generalized weakness;Acute pain   PT Problem List Decreased strength;Decreased range of motion;Decreased activity tolerance;Decreased balance;Decreased mobility;Decreased knowledge of use of DME;Cardiopulmonary status limiting activity;Pain;Decreased knowledge of precautions  PT Treatment Interventions DME instruction;Gait training;Stair training;Functional mobility training;Therapeutic activities;Therapeutic exercise;Balance training;Neuromuscular re-education;Patient/family education;Modalities   PT Goals (Current goals can be found in the Care Plan section) Acute Rehab PT Goals Patient Stated Goal: Go home with family PT Goal Formulation: With patient Time For Goal Achievement: 11/01/14 Potential to Achieve Goals: Good    Frequency Min 3X/week   Barriers to discharge        Co-evaluation               End of Session Equipment Utilized During Treatment: Gait belt;Oxygen Activity Tolerance: Patient tolerated treatment well Patient left: in chair;with call bell/phone within reach;with family/visitor present Nurse Communication: Mobility status         Time: 1215-1251 PT Time Calculation (min) (ACUTE ONLY): 36 min   Charges:   PT Evaluation $Initial PT Evaluation Tier I: 1 Procedure PT Treatments $Gait Training: 8-22 mins $Therapeutic  Activity: 8-22 mins   PT G CodesEllouise Newer 10/18/2014, 2:03 PM  Camille Bal [place], [place]

## 2014-10-19 ENCOUNTER — Inpatient Hospital Stay (HOSPITAL_COMMUNITY): Payer: Medicaid Other

## 2014-10-19 LAB — CBC
HEMATOCRIT: 28 % — AB (ref 39.0–52.0)
HEMOGLOBIN: 9.1 g/dL — AB (ref 13.0–17.0)
MCH: 30.5 pg (ref 26.0–34.0)
MCHC: 32.5 g/dL (ref 30.0–36.0)
MCV: 94 fL (ref 78.0–100.0)
PLATELETS: 213 10*3/uL (ref 150–400)
RBC: 2.98 MIL/uL — AB (ref 4.22–5.81)
RDW: 12.4 % (ref 11.5–15.5)
WBC: 19 10*3/uL — ABNORMAL HIGH (ref 4.0–10.5)

## 2014-10-19 LAB — URINALYSIS, ROUTINE W REFLEX MICROSCOPIC
BILIRUBIN URINE: NEGATIVE
GLUCOSE, UA: NEGATIVE mg/dL
KETONES UR: NEGATIVE mg/dL
Leukocytes, UA: NEGATIVE
Nitrite: NEGATIVE
Protein, ur: NEGATIVE mg/dL
SPECIFIC GRAVITY, URINE: 1.021 (ref 1.005–1.030)
Urobilinogen, UA: 1 mg/dL (ref 0.0–1.0)
pH: 5.5 (ref 5.0–8.0)

## 2014-10-19 LAB — TYPE AND SCREEN
ABO/RH(D): O POS
ANTIBODY SCREEN: NEGATIVE
UNIT DIVISION: 0
UNIT DIVISION: 0

## 2014-10-19 LAB — URINE MICROSCOPIC-ADD ON

## 2014-10-19 MED ORDER — PNEUMOCOCCAL VAC POLYVALENT 25 MCG/0.5ML IJ INJ
0.5000 mL | INJECTION | INTRAMUSCULAR | Status: AC
  Administered 2014-10-20: 0.5 mL via INTRAMUSCULAR
  Filled 2014-10-19: qty 0.5

## 2014-10-19 MED ORDER — NYSTATIN 100000 UNIT/ML MT SUSP
5.0000 mL | Freq: Four times a day (QID) | OROMUCOSAL | Status: DC
  Administered 2014-10-19 – 2014-10-20 (×3): 500000 [IU] via ORAL
  Filled 2014-10-19 (×6): qty 5

## 2014-10-19 NOTE — Plan of Care (Signed)
Problem: Phase I Progression Outcomes Goal: Incision/dressings dry and intact Outcome: Completed/Met Date Met:  10/19/14 Goal: Sutures/staples intact Outcome: Completed/Met Date Met:  10/19/14

## 2014-10-19 NOTE — Progress Notes (Addendum)
       [address]       [place] [address]             [phone]          4 Days Post-Op Procedure(s) (LRB): ENDARTERCTOMY OF  INNOMINATE ARTERY  (N/A) PATCH ANGIOPLASTY OF RIGHT SUBCLAVIAN ARTERY , RIGHT COMMON CAROTID ARTERY & INNOMINATE ARTERY (Right) ENDARTERECTOMY RIGHT SUBCLAVIAN ARTERY (Right) ENDARTERECTOMY RIGHT COMMON CAROTID ARTERY (Right) PARTIAL STERNOTOMY & PLATING OF STERNUM (N/A)  Subjective: Feels "alright" this am.  Pain better controlled.  Coughing up some beige mucus.   Objective: Vital signs in last 24 hours: Patient Vitals for the past 24 hrs:  BP Temp Temp src Pulse Resp SpO2 Height Weight  10/19/14 0411 93/62 mmHg 99.7 F (37.6 C) Oral 89 18 91 % - -  10/19/14 0410 108/71 mmHg - - 87 - - - -  10/18/14 2328 112/60 mmHg - - 88 - - - -  10/18/14 1941 - - - - - 96 % - -  10/18/14 1938 (!) 104/50 mmHg 98.6 F (37 C) Oral 95 18 95 % - -  10/18/14 1720 - 99.1 F (37.3 C) Oral - - - - -  10/18/14 1546 - - - - - 92 % - -  10/18/14 1525 (!) 100/51 mmHg (!) 100.7 F (38.2 C) Oral 90 - (!) 88 % - -  10/18/14 1017 113/69 mmHg 99.7 F (37.6 C) Oral 93 12 92 % - -  10/18/14 0900 - - - - - - 5' 10.47" (1.79 m) 226 lb 13.7 oz (102.9 kg)   Current Weight  10/18/14 226 lb 13.7 oz (102.9 kg)     Intake/Output from previous day: 11/27 0701 - 11/28 0700 In: 840 [P.O.:840] Out: 950 [Urine:950]    PHYSICAL EXAM:  Heart: RRR Lungs: Coarse rhonchi on L Wound: Clean and dry    Lab Results: CBC: Recent Labs  10/18/14 0620 10/19/14 0248  WBC 18.8* 19.0*  HGB 10.4* 9.1*  HCT 31.8* 28.0*  PLT 200 213   BMET:  Recent Labs  10/17/14 0622 10/18/14 0620  NA 134* 135*  K 3.8 3.7  CL 95* 93*  CO2 28 30  GLUCOSE 118* 95  BUN 8 19  CREATININE 0.65 0.87  CALCIUM 8.6 8.6    PT/INR: No results for input(s): LABPROT, INR in the last 72 hours.    Assessment/Plan: S/P Procedure(s) (LRB): ENDARTERCTOMY OF  INNOMINATE ARTERY   (N/A) PATCH ANGIOPLASTY OF RIGHT SUBCLAVIAN ARTERY , RIGHT COMMON CAROTID ARTERY & INNOMINATE ARTERY (Right) ENDARTERECTOMY RIGHT SUBCLAVIAN ARTERY (Right) ENDARTERECTOMY RIGHT COMMON CAROTID ARTERY (Right) PARTIAL STERNOTOMY & PLATING OF STERNUM (N/A)   Leukocytosis, Tm 100.7. Wounds healing well, no dysuria. Likely pulmonary source. Continue aggressive pulm toilet, repeat CXR.   Wean O2 as tolerated.  Pain better controlled.  Continue current meds.  Ambulate in halls.   LOS: 4 days    COLLINS,GINA H 10/19/2014  Patient seen and examined, agree with above Fever likely pulmonary- IS

## 2014-10-19 NOTE — Progress Notes (Signed)
Physical Therapy Treatment Patient Details Name: Bradley Sawyer MRN: [id number] DOB: March 09, 1956 Today's Date: 10/19/2014    History of Present Illness 58 y.o. male s/p ENDARTERCTOMY OF  INNOMINATE ARTERY, RIGHT SUBCLAVIAN ARTERY, and RIGHT COMMON CAROTID ARTERY. PARTIAL STERNOTOMY & PLATING OF STERNUM .    PT Comments    Patient is progressing well towards physical therapy goals, ambulating up to 200 feet with a rolling walker and transferring at a min guard level for safety. Safely completed stair training today. SpO2 90-91% on 4L and 6L supplemental O2 while ambulating. 95% SpO2 at rest on 4L supplemental O2. Reviewed sternal precautions with patient. States he will have 24 hour care at home from family. Patient will continue to benefit from skilled physical therapy services to further improve independence with functional mobility.    Follow Up Recommendations  Home health PT;Supervision for mobility/OOB     Equipment Recommendations  3in1 (PT)    Recommendations for Other Services OT consult     Precautions / Restrictions Precautions Precautions: Sternal;Fall Precaution Comments: Reviewed sternal precautions Restrictions Weight Bearing Restrictions: Yes Other Position/Activity Restrictions: partial sternotomy    Mobility  Bed Mobility Overal bed mobility: Needs Assistance Bed Mobility: Rolling;Sidelying to Sit Rolling: Supervision Sidelying to sit: Min assist       General bed mobility comments: Min assist for truncal support into seated position. Cues for technique and pt was able to safely maintain sternal precautions  Transfers Overall transfer level: Needs assistance Equipment used: Rolling walker (2 wheeled) Transfers: Sit to/from Stand Sit to Stand: Min guard         General transfer comment: Close guard for safety. Pt performed from lowest bed setting and reclining chair without physical assist today. VC for hand placement to maintain  precautions.  Ambulation/Gait Ambulation/Gait assistance: Min guard Ambulation Distance (Feet): 200 Feet (additonal bout of 150 feet) Assistive device: Rolling walker (2 wheeled) Gait Pattern/deviations: Step-through pattern;Decreased stride length;Staggering left;Drifts right/left   Gait velocity interpretation: Below normal speed for age/gender General Gait Details: Close guard for safety. Pt continues to drift and stagger towards left especially with turns but self corrects with stability from walker. SpO2 90-91% on 4 and 6L supplemental O2 while ambulating. Cues for walker placement within base of support, and pursed lip breathing technique throughout.   Stairs Stairs: Yes Stairs assistance: Min guard Stair Management: Two rails;Step to pattern;Alternating pattern;Forwards Number of Stairs: 13 General stair comments: Educated on safe stair navigation. Cues for sequencing and to maintain step-to pattern. Safer when stepping down with left foot first and pt agrees. Completed without physical assist. States he feels confident with this task.  Wheelchair Mobility    Modified Rankin (Stroke Patients Only)       Balance                                    Cognition Arousal/Alertness: Awake/alert Behavior During Therapy: WFL for tasks assessed/performed Overall Cognitive Status: Within Functional Limits for tasks assessed                      Exercises      General Comments General comments (skin integrity, edema, etc.): Reviewed sternal precautions      Pertinent Vitals/Pain Pain Assessment: 0-10 Pain Score: 4  Pain Location: sternum and Lt shoulder Pain Intervention(s): Monitored during session;Repositioned;Premedicated before session  HR 80s-90s while ambulating    Home Living  Prior Function            PT Goals (current goals can now be found in the care plan section) Acute Rehab PT Goals PT Goal Formulation:  With patient Time For Goal Achievement: 11/01/14 Potential to Achieve Goals: Good Progress towards PT goals: Progressing toward goals    Frequency  Min 3X/week    PT Plan Current plan remains appropriate    Co-evaluation             End of Session Equipment Utilized During Treatment: Gait belt;Oxygen Activity Tolerance: Patient tolerated treatment well Patient left: in chair;with call bell/phone within reach     Time: 1153-1219 PT Time Calculation (min) (ACUTE ONLY): 26 min  Charges:  $Gait Training: 8-22 mins $Therapeutic Activity: 8-22 mins                    G Codes:      Ellouise Newer 10/20/14, 1:37 PM  Camille Bal [place], [place]

## 2014-10-19 NOTE — Progress Notes (Addendum)
  Progress Note    10/19/2014 9:22 AM 4 Days Post-Op  Subjective:  No complaints this am  Tm 100.7 now 99.7 37'S-827'M systolic HR 78'M-75'Q NSR 91% 5LO2NC   Filed Vitals:   10/19/14 0411  BP: 93/62  Pulse: 89  Temp: 99.7 F (37.6 C)  Resp: 18    Physical Exam: Lungs:  Non labored Incisions:  C/d/i Extremities:  2+ right radial pulse Neuro:  In tact  CBC    Component Value Date/Time   WBC 19.0* 10/19/2014 0248   RBC 2.98* 10/19/2014 0248   HGB 9.1* 10/19/2014 0248   HCT 28.0* 10/19/2014 0248   PLT 213 10/19/2014 0248   MCV 94.0 10/19/2014 0248   MCH 30.5 10/19/2014 0248   MCHC 32.5 10/19/2014 0248   RDW 12.4 10/19/2014 0248   LYMPHSABS 3.4 09/10/2014 1042   MONOABS 0.8 09/10/2014 1042   EOSABS 0.1 09/10/2014 1042   BASOSABS 0.0 09/10/2014 1042    BMET    Component Value Date/Time   NA 135* 10/18/2014 0620   K 3.7 10/18/2014 0620   CL 93* 10/18/2014 0620   CO2 30 10/18/2014 0620   GLUCOSE 95 10/18/2014 0620   BUN 19 10/18/2014 0620   CREATININE 0.87 10/18/2014 0620   CREATININE 1.01 03/12/2013 1111   CALCIUM 8.6 10/18/2014 0620   GFRNONAA >90 10/18/2014 0620   GFRNONAA 83 03/12/2013 1111   GFRAA >90 10/18/2014 0620   GFRAA >89 03/12/2013 1111    INR    Component Value Date/Time   INR 1.03 10/04/2014 1000     Intake/Output Summary (Last 24 hours) at 10/19/14 4920 Last data filed at 10/19/14 0900  Gross per 24 hour  Intake    960 ml  Output   1450 ml  Net   -490 ml     Assessment:  58 y.o. 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  CXR ordered for this am to evaluate PTX.  Continue pulmonary toilet and increasing mobilization - leukocytosis and fever is most likely pulmonary source, but will check a UA  -DVT prophylaxis:  Lovenox   Leontine Locket,  PA-C [facility] [phone] 10/19/2014 9:22 AM  Addendum  I have independently interviewed and examined the patient, and I agree with the physician assistant's findings.  OOB ambulating.  pCXR pending  Adele Barthel, MD [facility] [place] Office: [phone] Pager: [phone]  10/19/2014, 9:35 AM

## 2014-10-19 NOTE — Plan of Care (Signed)
Problem: Phase I Progression Outcomes Goal: Pain controlled with appropriate interventions Outcome: Completed/Met Date Met:  10/19/14 Goal: OOB as tolerated unless otherwise ordered Outcome: Completed/Met Date Met:  10/19/14 Goal: Tubes/drains patent Outcome: Completed/Met Date Met:  10/19/14 Goal: Vital signs/hemodynamically stable Outcome: Completed/Met Date Met:  10/19/14

## 2014-10-20 LAB — CBC
HCT: 26.8 % — ABNORMAL LOW (ref 39.0–52.0)
Hemoglobin: 8.7 g/dL — ABNORMAL LOW (ref 13.0–17.0)
MCH: 30.2 pg (ref 26.0–34.0)
MCHC: 32.5 g/dL (ref 30.0–36.0)
MCV: 93.1 fL (ref 78.0–100.0)
PLATELETS: 261 10*3/uL (ref 150–400)
RBC: 2.88 MIL/uL — ABNORMAL LOW (ref 4.22–5.81)
RDW: 12.2 % (ref 11.5–15.5)
WBC: 16.9 10*3/uL — ABNORMAL HIGH (ref 4.0–10.5)

## 2014-10-20 MED ORDER — OXYCODONE HCL 15 MG PO TABS: 15.0000 mg | ORAL_TABLET | ORAL | Status: DC | PRN

## 2014-10-20 MED ORDER — OXYCODONE HCL ER 10 MG PO T12A: 10.0000 mg | EXTENDED_RELEASE_TABLET | Freq: Two times a day (BID) | ORAL | Status: DC

## 2014-10-20 MED ORDER — NYSTATIN 100000 UNIT/ML MT SUSP: 5.0000 mL | Freq: Four times a day (QID) | OROMUCOSAL | Status: DC

## 2014-10-20 NOTE — Progress Notes (Signed)
SATURATION QUALIFICATIONS: (This note is used to comply with regulatory documentation for home oxygen)  Patient Saturations on Room Air at Rest = 88%  Patient Saturations on Room Air while Ambulating = 87%  Patient Saturations on 2 Liters of oxygen while Ambulating = 93%  Please briefly explain why patient needs home oxygen: Patient's oxygen level desaturates on RA.

## 2014-10-20 NOTE — Evaluation (Signed)
Occupational Therapy Evaluation Patient Details Name: Bradley Sawyer MRN: [id number] DOB: 08/15/1956 Today's Date: 10/20/2014    History of Present Illness 58 y.o. male s/p ENDARTERCTOMY OF  INNOMINATE ARTERY, RIGHT SUBCLAVIAN ARTERY, and RIGHT COMMON CAROTID ARTERY. PARTIAL STERNOTOMY & PLATING OF STERNUM .   Clinical Impression   Patient is s/p Endarterctomy of innominate artery R subclavian artery and R common carotid artery partial sternotomy and plating of sternum surgery resulting in functional limitations due to the deficits listed below (see OT problem list). PTA pt was independent with chronic scrotum pain.  Patient will benefit from skilled OT acutely to increase independence and safety with ADLS to allow discharge [place] .Ot to follow acutely for sternal precautions with adls     Follow Up Recommendations  Home health OT    Equipment Recommendations  Tub/shower bench    Recommendations for Other Services       Precautions / Restrictions Precautions Precautions: Sternal;Fall Precaution Comments: Reviewed sternal precautions Restrictions Weight Bearing Restrictions: No      Mobility Bed Mobility Overal bed mobility: Needs Assistance Bed Mobility: Supine to Sit     Supine to sit: Mod assist;HOB elevated     General bed mobility comments: Pt using HOB controls to incr HOB and needing (A) to side <>sit. Pt with rest break at EOB.  Transfers Overall transfer level: Needs assistance Equipment used: Rolling walker (2 wheeled) Transfers: Sit to/from Stand Sit to Stand: Min assist         General transfer comment: educated on scooting out to EOB to allow easier sit<>Stand . Pt once scooting out to EOB abl to complete transfer hugging pillow with rocking motion min guard (A)    Balance                                            ADL Overall ADL's : Needs assistance/impaired     Grooming: Wash/dry face;Supervision/safety;Sitting        Lower Body Bathing: Sit to/from stand;Minimal assistance       Lower Body Dressing: Minimal assistance;Sit to/from stand   Toilet Transfer: Minimal assistance;RW           Functional mobility during ADLs: Minimal assistance;Rolling walker General ADL Comments: Pt very anxious with mobility fearful of "messing up" the surgery. OT educating patient on sternal precautions and safe to mobilize. Pt reports history of scrotal pain and suggesting underwear to prevent pain with scooting to eob. Pt very happy to attempt underwear. Pt able to hook bil LE at EOB and don. Pt needed cues for hand placement with transfers     Vision                     Perception     Praxis      Pertinent Vitals/Pain Pain Assessment: 0-10 Pain Score: 7  Pain Location: Lt shoulder Pain Intervention(s): Repositioned     Hand Dominance Right   Extremity/Trunk Assessment Upper Extremity Assessment Upper Extremity Assessment: Overall WFL for tasks assessed (sternal precautions)   Lower Extremity Assessment Lower Extremity Assessment: Defer to PT evaluation   Cervical / Trunk Assessment Cervical / Trunk Assessment: Normal   Communication Communication Communication: No difficulties   Cognition Arousal/Alertness: Awake/alert Behavior During Therapy: WFL for tasks assessed/performed Overall Cognitive Status: Within Functional Limits for tasks assessed  General Comments       Exercises       Shoulder Instructions      Home Living Family/patient expects to be discharged to:: Private residence Living Arrangements: Alone Available Help at Discharge: Family;Available 24 hours/day Type of Home: Mobile home (double wide ) Home Access: Stairs to enter Entrance Stairs-Number of Steps: 3 Entrance Stairs-Rails: Right Home Layout: One level     Bathroom Shower/Tub: Teacher, early years/pre: Standard     Home Equipment: Environmental consultant - 2 wheels;Cane -  single point          Prior Functioning/Environment Level of Independence: Independent        Comments: pt drives and was fully independent prior to surgery    OT Diagnosis: Generalized weakness;Acute pain   OT Problem List: Decreased strength;Decreased activity tolerance;Impaired balance (sitting and/or standing);Decreased safety awareness;Decreased knowledge of use of DME or AE;Decreased knowledge of precautions;Pain   OT Treatment/Interventions: Self-care/ADL training;Therapeutic exercise;DME and/or AE instruction;Therapeutic activities;Patient/family education;Balance training    OT Goals(Current goals can be found in the care plan section) Acute Rehab OT Goals Patient Stated Goal: Go home with family OT Goal Formulation: With patient Time For Goal Achievement: 11/03/14 Potential to Achieve Goals: Good  OT Frequency: Min 2X/week   Barriers to D/C:            Co-evaluation              End of Session Equipment Utilized During Treatment: Rolling walker Nurse Communication: Mobility status;Precautions  Activity Tolerance: Patient tolerated treatment well Patient left: in bed;with call bell/phone within reach   Time: 0910-0930 OT Time Calculation (min): 20 min Charges:  OT General Charges $OT Visit: 1 Procedure OT Evaluation $Initial OT Evaluation Tier I: 1 Procedure OT Treatments $Self Care/Home Management : 8-22 mins G-Codes:    Parke Poisson B 08-Nov-2014, 9:34 AM  Pager: [phone]

## 2014-10-20 NOTE — Progress Notes (Addendum)
  Progress Note    10/20/2014 8:58 AM 5 Days Post-Op  Subjective:  C/o pain in both shoulders  Afebrile VSS 94% 3LO2NC (down to 2L now)  Filed Vitals:   10/20/14 0517  BP: 109/62  Pulse: 75  Temp: 98.2 F (36.8 C)  Resp: 18    Physical Exam: Cardiac:  regular Lungs:  CTAB Incisions:  C/d/i Extremities:  2+ right radial pulse Neuro:  In tact  CBC    Component Value Date/Time   WBC 16.9* 10/20/2014 0315   RBC 2.88* 10/20/2014 0315   HGB 8.7* 10/20/2014 0315   HCT 26.8* 10/20/2014 0315   PLT 261 10/20/2014 0315   MCV 93.1 10/20/2014 0315   MCH 30.2 10/20/2014 0315   MCHC 32.5 10/20/2014 0315   RDW 12.2 10/20/2014 0315   LYMPHSABS 3.4 09/10/2014 1042   MONOABS 0.8 09/10/2014 1042   EOSABS 0.1 09/10/2014 1042   BASOSABS 0.0 09/10/2014 1042    BMET    Component Value Date/Time   NA 135* 10/18/2014 0620   K 3.7 10/18/2014 0620   CL 93* 10/18/2014 0620   CO2 30 10/18/2014 0620   GLUCOSE 95 10/18/2014 0620   BUN 19 10/18/2014 0620   CREATININE 0.87 10/18/2014 0620   CREATININE 1.01 03/12/2013 1111   CALCIUM 8.6 10/18/2014 0620   GFRNONAA >90 10/18/2014 0620   GFRNONAA 83 03/12/2013 1111   GFRAA >90 10/18/2014 0620   GFRAA >89 03/12/2013 1111    INR    Component Value Date/Time   INR 1.03 10/04/2014 1000     Intake/Output Summary (Last 24 hours) at 10/20/14 0858 Last data filed at 10/20/14 0518  Gross per 24 hour  Intake    480 ml  Output    800 ml  Net   -320 ml     Assessment:  58 y.o. 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  He is down to 2LO2NC. -He states that after his surgery in January, he left the hospital with O2 (he did not come in on O2).  We will have him evaluated for home O2. -leukocytosis is improved this am and he is afebrile.  Continue IS at home.  Continue  increasing mobilization. -DVT prophylaxis:  Lovenox -small right apical PTX was slightly decreased yesterday.  Pt is ambulating. -discussed with CT surgery and they agree the pt can be discharged today.   Leontine Locket, PA-C [facility] [phone] 10/20/2014 8:58 AM  Addendum  I have independently interviewed and examined the patient, and I agree with the physician assistant's findings.  Pain better controlled.  Pt is ambulating with SaO2 88% which likely his norm with COPD.  Will get him evaluated for home oxygen.  Ok to D/C home if ok with CT Surgery.  Adele Barthel, MD [facility] [place] Office: [phone] Pager: [phone]  10/20/2014, 9:29 AM

## 2014-10-20 NOTE — Progress Notes (Addendum)
       [address]       [place] [address]             [phone]          5 Days Post-Op Procedure(s) (LRB): ENDARTERCTOMY OF  INNOMINATE ARTERY  (N/A) PATCH ANGIOPLASTY OF RIGHT SUBCLAVIAN ARTERY , RIGHT COMMON CAROTID ARTERY & INNOMINATE ARTERY (Right) ENDARTERECTOMY RIGHT SUBCLAVIAN ARTERY (Right) ENDARTERECTOMY RIGHT COMMON CAROTID ARTERY (Right) PARTIAL STERNOTOMY & PLATING OF STERNUM (N/A)  Subjective: Main complaint today is shoulder pain.   Objective: Vital signs in last 24 hours: Patient Vitals for the past 24 hrs:  BP Temp Temp src Pulse Resp SpO2 Height Weight  10/20/14 0517 109/62 mmHg 98.2 F (36.8 C) Oral 75 18 94 % - -  10/20/14 0500 - - - - - - 5\' 11"  (1.803 m) 222 lb 4.8 oz (100.835 kg)  10/19/14 2133 104/86 mmHg 98.8 F (37.1 C) Oral 74 20 91 % - -  10/19/14 2028 - - - - - 93 % - -  10/19/14 1441 (!) 131/92 mmHg 98.6 F (37 C) Oral 84 20 92 % - -   Current Weight  10/20/14 222 lb 4.8 oz (100.835 kg)     Intake/Output from previous day: 11/28 0701 - 11/29 0700 In: 480 [P.O.:480] Out: 800 [Urine:800]    PHYSICAL EXAM:  Heart: RRR Lungs: Coarse BS which clear with cough Wound: Clean and dry     Lab Results: CBC: Recent Labs  10/19/14 0248 10/20/14 0315  WBC 19.0* 16.9*  HGB 9.1* 8.7*  HCT 28.0* 26.8*  PLT 213 261   BMET:  Recent Labs  10/18/14 0620  NA 135*  K 3.7  CL 93*  CO2 30  GLUCOSE 95  BUN 19  CREATININE 0.87  CALCIUM 8.6    PT/INR: No results for input(s): LABPROT, INR in the last 72 hours.  CXR: FINDINGS: Cardiomediastinal silhouette is stable. Status post median sternotomy. No pulmonary edema. Mild left basilar atelectasis. Left IJ central line has been removed. Small right apical pneumothorax decreased from prior exam. No segmental infiltrate.  IMPRESSION: No pulmonary edema. Small left basilar atelectasis. Small right apical pneumothorax decreased from prior  exam.   Assessment/Plan: S/P Procedure(s) (LRB): ENDARTERCTOMY OF  INNOMINATE ARTERY  (N/A) PATCH ANGIOPLASTY OF RIGHT SUBCLAVIAN ARTERY , RIGHT COMMON CAROTID ARTERY & INNOMINATE ARTERY (Right) ENDARTERECTOMY RIGHT SUBCLAVIAN ARTERY (Right) ENDARTERECTOMY RIGHT COMMON CAROTID ARTERY (Right) PARTIAL STERNOTOMY & PLATING OF STERNUM (N/A) Leukocytosis improving, no fever.  Wounds ok.  Likely pulmonary source with atelectasis on CXR. Continue pulm toilet.  Home O2 being arranged. For possible d/c home today per [facility].   LOS: 5 days    COLLINS,GINA H 10/20/2014  Fever resolved. He is being discharged today Will have office call him to set up f/u with [facility]

## 2014-10-20 NOTE — Discharge Summary (Signed)
Discharge Summary     Bradley Sawyer 1955/12/21 58 y.o. 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 male who returns for follow-up today for now known innominate artery stenosis.. This was found incidentally on measuring the blood pressure in his right arm. The patient denies any exertional fatigue in the right arm. He does have occasional numbness in both hands. However the right is not worse than left. He states he did have a stroke in the past which affected his speech memory and right hand. This was after a back operation in February of 2015. He fully recovered. He denies any recent symptoms of TIA or stroke. He denies any symptoms of dizziness. However, he has had several episodes recently where he had a lower field cut in his right eye. This would resolve and a few minutes. He is on Plavix. He currently is a smoker but is trying to quit. He is right handed. He states that they have not been able to get a blood pressure in his right arm for several months. Other medical problems include hyperlipidemia, hypertension, tobacco abuse, COPD, chronic back pain all of which are currently stable. He has had previous cervical and lumbar fusion. Recent cardiac catheterization showed no significant flow-limiting coronary lesions.  Hospital Course:  The patient was admitted to the hospital and taken to the operating room on 10/15/2014 and underwent Partial sternotomy for exposure of innominate artery and common carotid artery and subclavian artery with endarterectomy of innominate artery, subclavian artery, and common carotid artery with patch angioplasty. Plating of sternum.  Later that day, he was seen in the SICU and was doing well.  He was extubated that afternoon.  He had a 2+ palpable right radial pulse and his neuro exam was in tact.    By POD 1,  the pt neuro status was in tact.  He continued to have a palpable right radial pulse throughout his hospitalization as well as an intact neuro exam.  His JP was discontinued on POD 2.  Also, he was having poor pain control and his narcotics were increased due to likely intolerance.  He was transferred to the stepdown unit.  He was transferred to the telemetry floor on POD 3.  His Mobic was resumed.  CXR revealed a smal right apical PTX.    On POD 4, he did have a fever, which was most likely pulmonary in source.  A UA was checked and was negative.  His fever did improve as well as his leukocytosis.  On POD 5, his pain was much better controlled.  He is being evaluated for home O2.  He was approved for this and was discharged home on home oxygen.  He is given pain medication prescriptions for his pain regimen here, which consist of OxyContin and OxyIR.  He is to discontinue his Percocet.  Number of pills given are listed below.  The remainder of the hospital course consisted of increasing mobilization and increasing intake of solids without difficulty.    Recent Labs  10/18/14 0620  NA 135*  K 3.7  CL 93*  CO2 30  GLUCOSE 95  BUN 19  CALCIUM 8.6    Recent Labs  10/19/14 0248 10/20/14 0315  WBC 19.0* 16.9*  HGB 9.1* 8.7*  HCT 28.0* 26.8*  PLT 213 261   No results for input(s): INR in  the last 72 hours.  Discharge Instructions:   The patient is discharged with extensive instructions on wound care and progressive ambulation.  They are instructed not to drive or perform any heavy lifting until returning to see the physician in his office.      Discharge Instructions    Call MD for:  redness, tenderness, or signs of infection (pain, swelling, bleeding, redness, odor or green/yellow discharge around incision site)    Complete by:  As directed      Call MD for:  severe or increased pain, loss or decreased feeling  in affected limb(s)    Complete by:  As directed      Call MD for:   temperature >100.5    Complete by:  As directed      Discharge wound care:    Complete by:  As directed   Shower daily with soap and water starting 10/21/14     Driving Restrictions    Complete by:  As directed   No driving for 2 weeks     Lifting restrictions    Complete by:  As directed   No lifting for 4 weeks     Resume previous diet    Complete by:  As directed            Discharge Diagnosis:  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 Carotid stenosis 10/15/2014  . Innominate artery stenosis 09/19/2014  . 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 Pre-operative cardiovascular examination 08/28/2014  . PAD (peripheral artery disease) 08/23/2014  . Contusion of finger with damage to nail 08/12/2014  . Preventative health care 08/12/2014  . Occlusion and stenosis of carotid artery without mention of cerebral infarction 07/25/2014  . Subclavian steal syndrome 07/25/2014  . CAD (coronary artery disease) 07/10/2014  . Unequal blood pressure in upper extremities 04/10/2014  . CVA (cerebral infarction) 04/10/2014  . 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 Hematuria 08/06/2013  . Compression fracture of L2, with back and right groin pain 07/18/2013  . History of colon polyps 05/18/2012  . COPD (chronic obstructive pulmonary disease) 12/24/2009  . SHOULDER PAIN, RIGHT 12/13/2006  . Hyperlipemia 09/20/2006  . TOBACCO ABUSE 09/20/2006  . Essential hypertension 09/20/2006  . VOCAL CORD DISORDER 09/20/2006  . GERD 09/20/2006   Past Medical History  Diagnosis Date  . Hyperlipidemia   . Hypertension   . GERD (gastroesophageal reflux disease)   . Tobacco abuse   . COPD (chronic obstructive pulmonary disease)     bronchitis  . Disorder of vocal cord   . Cocaine dependence     Last use 1998, attends 3 NA meetings weekly.  . Right shoulder pain     2/2 partial rotator cuff  tear, tendinopathy, mild subacromial/subdeltioid bursitis, A/C joint arthropathy per MRI done in 3/07  . Arthritis   . Anxiety   . Compression fracture     L2  . CVA (cerebral infarction)   . Fatty liver   . Hx of cardiovascular stress test     ETT/Lexiscan-Myoview (10/15):  Inferior, inferoapical, apical lateral defect c/w mild ischemia, EF 66%; Intermediate Risk  . 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      Medication List    STOP taking these medications        atorvastatin 20 MG tablet  Commonly known as:  LIPITOR     oxyCODONE-acetaminophen 7.5-325 MG per tablet  Commonly known  as:  PERCOCET      TAKE these medications        albuterol 108 (90 BASE) MCG/ACT inhaler  Commonly known as:  PROVENTIL HFA;VENTOLIN HFA  Inhale 2 puffs into the lungs every 6 (six) hours as needed for wheezing.     amLODipine 10 MG tablet  Commonly known as:  NORVASC  Take 1 tablet (10 mg total) by mouth daily.     aspirin EC 81 MG tablet  Take 81 mg by mouth daily.     clopidogrel 75 MG tablet  Commonly known as:  PLAVIX  Take 1 tablet (75 mg total) by mouth daily.     docusate sodium 100 MG capsule  Commonly known as:  COLACE  Take 100 mg by mouth 2 (two) times daily.     FLUoxetine 10 MG capsule  Commonly known as:  PROZAC  Take 10 mg by mouth daily.     Fluticasone-Salmeterol 500-50 MCG/DOSE Aepb  Commonly known as:  ADVAIR  Inhale 1 puff into the lungs every 12 (twelve) hours.     gabapentin 400 MG capsule  Commonly known as:  NEURONTIN  Take 2 capsules (800 mg total) by mouth 3 (three) times daily.     lisinopril-hydrochlorothiazide 20-12.5 MG per tablet  Commonly known as:  PRINZIDE  Take 1 tablet by mouth daily.     meloxicam 15 MG tablet  Commonly known as:  MOBIC  Take 15 mg by mouth daily.     nicotine 21 mg/24hr patch  Commonly known as:  NICODERM CQ - dosed in mg/24 hours  Place 21 mg onto the skin daily.     nitroGLYCERIN 0.4 MG SL  tablet  Commonly known as:  NITROSTAT  Place 1 tablet (0.4 mg total) under the tongue every 5 (five) minutes as needed for chest pain.     nystatin 100000 UNIT/ML suspension  Commonly known as:  MYCOSTATIN  Take 5 mLs (500,000 Units total) by mouth 4 (four) times daily.     omega-3 acid ethyl esters 1 G capsule  Commonly known as:  LOVAZA  Take 2 capsules (2 g total) by mouth 2 (two) times daily.     oxyCODONE 15 MG immediate release tablet  Commonly known as:  ROXICODONE  Take 1 tablet (15 mg total) by mouth every 4 (four) hours as needed for moderate pain.     OxyCODONE 10 mg T12a 12 hr tablet  Commonly known as:  OXYCONTIN  Take 1 tablet (10 mg total) by mouth every 12 (twelve) hours.     pantoprazole 40 MG tablet  Commonly known as:  PROTONIX  Take 1 tablet (40 mg total) by mouth daily.     polyethylene glycol packet  Commonly known as:  MIRALAX / GLYCOLAX  Take 17 g by mouth daily as needed (for constipation).     pravastatin 20 MG tablet  Commonly known as:  PRAVACHOL  Take 1 tablet (20 mg total) by mouth daily.     varenicline 0.5 MG tablet  Commonly known as:  CHANTIX  Weeks 5-8: Take 1 mg (2 tabs) twice daily        Prescriptions given: 1.  OxyIR 15mg  po q4h prn pain #50 NR 2.  Oxycontin 10mg  po q12h #30 NR (his Percocet has been discontinued) 3.  Nystatin swish and swallow  Disposition: home  Patient's condition: is Good  Follow up: 1. Dr. Oneida Alar in 2 weeks. 2. Dr. Servando Snare in 2 weeks.  Leontine Locket, PA-C [facility] [phone]  --- For [facility] use --- Instructions: Press F2 to tab through selections.  Delete question if not applicable.     IV medication needed for:  1. Hypertension: No 2. Hypotension: No  Post-op Complications: No  1. Post-op CVA or TIA: No  If yes: Event classification (right eye, left eye, right cortical, left cortical, verterobasilar, other): n/a  If yes: Timing of event (intra-op, <6 hrs  post-op, >=6 hrs post-op, unknown): n/a  2. CN injury: No  If yes: CN n/a injuried   3. Myocardial infarction: No  If yes: Dx by (EKG or clinical, Troponin): n/a  4.  CHF: No  5.  Dysrhythmia (new): No  6. Wound infection: No  7. Reperfusion symptoms: No  8. Return to OR: No  If yes: return to OR for (bleeding, neurologic, other CEA incision, other): n  Discharge medications: Statin use:  Yes If No: [ ]  For Medical reasons, [ ]  Non-compliant, [ ]  Not-indicated ASA use:  Yes  If No: [ ]  For Medical reasons, [ ]  Non-compliant, [ ]  Not-indicated Beta blocker use:  No If No: [ ]  For Medical reasons, [ ]  Non-compliant, [ ]  Not-indicated ACE-Inhibitor use:  Yes If No: [ ]  For Medical reasons, [ ]  Non-compliant, [ ]  Not-indicated P2Y12 Antagonist use: Yes, [ x] Plavix, [ ]  Plasugrel, [ ]  Ticlopinine, [ ]  Ticagrelor, [ ]  Other, [ ]  No for medical reason, [ ]  Non-compliant, [ ]  Not-indicated Anti-coagulant use:  No, [ ]  Warfarin, [ ]  Rivaroxaban, [ ]  Dabigatran, [ ]  Other, [ ]  No for medical reason, [ ]  Non-compliant, [ ]  Not-indicated

## 2014-10-21 ENCOUNTER — Telehealth: Payer: Self-pay | Admitting: Vascular Surgery

## 2014-10-21 NOTE — Telephone Encounter (Addendum)
-----   Message from Mena Goes, RN sent at 10/21/2014 10:27 AM EST ----- Regarding: Schedule   ----- Message -----    From: D[address]    Sent: 10/20/2014   5:29 PM      To: [facility] Charge Pool Subject: [name]                                          ----- Message -----    From: Gabriel Earing, PA-C    Sent: 10/20/2014   9:39 AM      To: [facility] Charge Pool  S/p Endarterectomy of innominate right common and right subclavian arteries with Dacron patch angioplasty with Dr. Servando Snare.    Pt needs to f/u with Dr. Oneida Alar in 2 weeks.  He also needs to see Dr. Servando Snare.  They should set up the appt to see Dr. Servando Snare, but please f/u and make sure this has been done.  Thanks, Aldona Bar       10/21/14": spoke with pt to schedule appt for 12/17. He is already scheduled to see Dr Servando Snare on 11/12/2014 @ 3:30pm. dpm

## 2014-10-23 ENCOUNTER — Telehealth: Payer: Self-pay

## 2014-10-23 NOTE — Telephone Encounter (Signed)
-----   Message from Gabriel Earing, [place] sent at 10/22/2014  2:07 PM EST -----   ----- Message -----    From: Elam Dutch, MD    Sent: 10/21/2014   5:04 PM      To: Gabriel Earing, PA-C  He can stop his plavix.  He can be on aspirin alone at this point.  Charles ----- Message -----    From: Gabriel Earing, PA-C    Sent: 10/20/2014   9:47 AM      To: Elam Dutch, MD  Just FYI, this pt came in on Plavix, Protonix and Prozac.  The Prozac and Protonix can decrease the efficacy of the Plavix.  Should this be changed?  Since he came in on these medications, I discussed with Dr. Bridgett Larsson and left him on these meds at discharge.  Thanks, Aldona Bar

## 2014-10-23 NOTE — Telephone Encounter (Signed)
Notified patient to stop Plavix and continue Aspirin alone, per Leontine Locket, PA request. Patient acknowledged and verbalized understanding.

## 2014-10-31 ENCOUNTER — Encounter (HOSPITAL_COMMUNITY): Payer: Self-pay | Admitting: Vascular Surgery

## 2014-11-04 ENCOUNTER — Other Ambulatory Visit: Payer: Self-pay | Admitting: *Deleted

## 2014-11-04 ENCOUNTER — Telehealth: Payer: Self-pay | Admitting: *Deleted

## 2014-11-04 DIAGNOSIS — G8918 Other acute postprocedural pain: Secondary | ICD-10-CM

## 2014-11-04 MED ORDER — OXYCODONE-ACETAMINOPHEN 7.5-325 MG PO TABS: 1.0000 | ORAL_TABLET | Freq: Four times a day (QID) | ORAL | Status: DC | PRN

## 2014-11-04 NOTE — Telephone Encounter (Signed)
Patient call to request pain medication, he is s/p partial sternotomy with innominate artery endarterectomy by Dr. Oneida Alar and Servando Snare on 10-15-14. He is afebrile and breathing without difficulty; is using the spirometer daily. Pain is at incision site only, no drainage or erythema present per patient. Dr. Trula Slade has written him Percocet 7.5-325mg  # 20 to carry him until his next appt with Dr. Oneida Alar on 11-07-14.

## 2014-11-06 ENCOUNTER — Encounter: Payer: Self-pay | Admitting: Vascular Surgery

## 2014-11-07 ENCOUNTER — Ambulatory Visit (INDEPENDENT_AMBULATORY_CARE_PROVIDER_SITE_OTHER): Payer: Medicaid Other | Admitting: Internal Medicine

## 2014-11-07 ENCOUNTER — Ambulatory Visit (INDEPENDENT_AMBULATORY_CARE_PROVIDER_SITE_OTHER): Payer: Medicaid Other | Admitting: Vascular Surgery

## 2014-11-07 ENCOUNTER — Encounter: Payer: Self-pay | Admitting: Internal Medicine

## 2014-11-07 ENCOUNTER — Encounter: Payer: Self-pay | Admitting: Vascular Surgery

## 2014-11-07 VITALS — BP 119/75 | HR 86 | Resp 18 | Ht 71.0 in | Wt 218.0 lb

## 2014-11-07 VITALS — BP 130/82 | HR 71 | Temp 97.4°F | Ht 71.0 in | Wt 217.6 lb

## 2014-11-07 DIAGNOSIS — E785 Hyperlipidemia, unspecified: Secondary | ICD-10-CM

## 2014-11-07 DIAGNOSIS — I10 Essential (primary) hypertension: Secondary | ICD-10-CM

## 2014-11-07 DIAGNOSIS — Z48812 Encounter for surgical aftercare following surgery on the circulatory system: Secondary | ICD-10-CM

## 2014-11-07 DIAGNOSIS — I771 Stricture of artery: Secondary | ICD-10-CM

## 2014-11-07 DIAGNOSIS — F17200 Nicotine dependence, unspecified, uncomplicated: Secondary | ICD-10-CM

## 2014-11-07 DIAGNOSIS — Z720 Tobacco use: Secondary | ICD-10-CM

## 2014-11-07 DIAGNOSIS — G8918 Other acute postprocedural pain: Secondary | ICD-10-CM

## 2014-11-07 MED ORDER — OXYCODONE-ACETAMINOPHEN 7.5-325 MG PO TABS: 1.0000 | ORAL_TABLET | Freq: Four times a day (QID) | ORAL | Status: DC | PRN

## 2014-11-07 NOTE — Assessment & Plan Note (Addendum)
Patient w/ recent innominate artery endarterectomy w/ partial sternotomy on 10/15/14 w/ Dr. Gae Gallop. Still having some right-sided chest pain associated w/ his sternotomy scar. No recent SOB different from his baseline, no left-sided chest pain, UE weakness, change in color, temperature, dizziness, lightheadedness, or pain in the UE's. Equal radial pulses, BP generally equal in both extremities. Of note, Plavix discontinued recently, only taking ASA. -F/u w/ Dr. Oneida Alar today. Pain management per [facility] -Return to clinic in 3 months.

## 2014-11-07 NOTE — Assessment & Plan Note (Signed)
Patient taking Pravachol 20 mg daily, but says he had two different statin medications at home and brought in a bottle of atorvastatin as well. He has only been taking the Pravachol. Told him to continue doing this. Disposed of atorvastatin.

## 2014-11-07 NOTE — Addendum Note (Signed)
Addended by: Mena Goes on: 11/07/2014 04:33 PM   Modules accepted: Orders

## 2014-11-07 NOTE — Progress Notes (Signed)
POST OPERATIVE OFFICE NOTE    CC:  F/u for surgery  HPI:  This is a 58 y.o. male who is s/p Partial sternotomy for exposure of innominate artery and common carotid artery and subclavian artery with endarterectomy of innominate artery, subclavian artery, and common carotid artery with patch angioplasty. Plating of sternum on 10/15/13 by Dr. Oneida Alar and Dr. Servando Snare.  He is doing well from surgery, but states he still has a stabbing like sensation in his chest on occasion when he is washing or using his hands.  He still complains of right shoulder pain.  He was given Percocet #20 on Monday to last him through the week until his appointment today with Dr. Oneida Alar.  No Known Allergies  Current Outpatient Prescriptions  Medication Sig Dispense Refill  . albuterol (PROVENTIL HFA;VENTOLIN HFA) 108 (90 BASE) MCG/ACT inhaler Inhale 2 puffs into the lungs every 6 (six) hours as needed for wheezing. 3 Inhaler 2  . amLODipine (NORVASC) 10 MG tablet Take 1 tablet (10 mg total) by mouth daily. 30 tablet 11  . aspirin EC 81 MG tablet Take 81 mg by mouth daily.    Marland Kitchen docusate sodium (COLACE) 100 MG capsule Take 100 mg by mouth 2 (two) times daily.    Marland Kitchen FLUoxetine (PROZAC) 10 MG capsule Take 10 mg by mouth daily.    . Fluticasone-Salmeterol (ADVAIR) 500-50 MCG/DOSE AEPB Inhale 1 puff into the lungs every 12 (twelve) hours. 180 each 4  . gabapentin (NEURONTIN) 400 MG capsule Take 2 capsules (800 mg total) by mouth 3 (three) times daily. 180 capsule 4  . lisinopril-hydrochlorothiazide (PRINZIDE) 20-12.5 MG per tablet Take 1 tablet by mouth daily. 30 tablet 6  . meloxicam (MOBIC) 15 MG tablet Take 15 mg by mouth daily.    . nicotine (NICODERM CQ - DOSED IN MG/24 HOURS) 21 mg/24hr patch Place 21 mg onto the skin daily.    . nitroGLYCERIN (NITROSTAT) 0.4 MG SL tablet Place 1 tablet (0.4 mg total) under the tongue every 5 (five) minutes as needed for chest pain. 25 tablet 8  . nystatin (MYCOSTATIN) 100000 UNIT/ML  suspension Take 5 mLs (500,000 Units total) by mouth 4 (four) times daily. 60 mL 0  . omega-3 acid ethyl esters (LOVAZA) 1 G capsule Take 2 capsules (2 g total) by mouth 2 (two) times daily. 90 capsule 3  . OxyCODONE (OXYCONTIN) 10 mg T12A 12 hr tablet Take 1 tablet (10 mg total) by mouth every 12 (twelve) hours. 30 tablet 0  . oxyCODONE (ROXICODONE) 15 MG immediate release tablet Take 1 tablet (15 mg total) by mouth every 4 (four) hours as needed for moderate pain. 50 tablet 0  . oxyCODONE-acetaminophen (PERCOCET) 7.5-325 MG per tablet Take 1 tablet by mouth every 6 (six) hours as needed for pain. 60 tablet 0  . pantoprazole (PROTONIX) 40 MG tablet Take 1 tablet (40 mg total) by mouth daily. 30 tablet 6  . polyethylene glycol (MIRALAX / GLYCOLAX) packet Take 17 g by mouth daily as needed (for constipation).    . pravastatin (PRAVACHOL) 20 MG tablet Take 1 tablet (20 mg total) by mouth daily. 30 tablet 11   No current facility-administered medications for this visit.     ROS:  See HPI  Physical Exam:  Filed Vitals:   11/07/14 1335  BP: 119/75  Pulse: 86  Resp:     Incision:  Healing nicely Extremities:  2+ bilateral radial and carotid pulses Neuro: in tact.  Assessment/Plan:  This is a 58  y.o. male who is s/p: Partial sternotomy for exposure of innominate artery and common carotid artery and subclavian artery with endarterectomy of innominate artery, subclavian artery, and common carotid artery with patch angioplasty. Plating of sternum 10/15/14  -pt is doing well.  He is still having pain within his sternotomy and incision as well as right shoulder pain possibly due to positioning in the OR.   -he does have palpable bilateral radial pulses as well as bilateral carotid pulses. -we will give him Percocet 7/325 #60 NR to last him 2 weeks as he is taking ~ 5/day.  Hopefully, he will be able to wean these over the next 2 weeks. -will get a CTA of the chest in 6 weeks and see him  back at that time -pt advised no heavy lifting more than a gallon of milk until seeing him back in 6 weeks.   Leontine Locket, PA-C [facility] [phone]  Clinic MD:  Pt seen and examined with Dr. Oneida Alar  History and exam findings as above. Patient has symmetric and palpable radial and carotid pulses. No carotid bruit. Incision is healing well without drainage. The patient will follow-up in 6 weeks with a CT angiogram of the chest at that point.  Ruta Hinds, MD [facility] [place] Office: [phone] Pager: [phone]

## 2014-11-07 NOTE — Assessment & Plan Note (Signed)
  Assessment: Progress toward smoking cessation:    Patient has officially quit smoking, says he has not smokes in 2-3 months.  Barriers to progress toward smoking cessation:   None Comments: Was previously taking Chantix but says that he no longer needs this.   Plan: Instruction/counseling given:  I commended patient for quitting and reviewed strategies for preventing relapses. Medications to assist with smoking cessation:  None; Chantix taken off of his medication list.

## 2014-11-07 NOTE — Patient Instructions (Signed)
General Instructions:  1. Please schedule a follow up appointment for 3 months.  2. Please take all medications as prescribed.   Continue Pravastatin, STOP Atorvastatin.   3. If you have worsening of your symptoms or new symptoms arise, please call the clinic ([phone]), or go to the ER immediately if symptoms are severe.  You have done a great job in taking all your medications. I appreciate it very much. Please continue doing that.   Please bring your medicines with you each time you come to clinic.  Medicines may include prescription medications, over-the-counter medications, herbal remedies, eye drops, vitamins, or other pills.   Progress Toward Treatment Goals:  Treatment Goal 11/07/2014  Blood pressure at goal  Stop smoking -    Self Care Goals & Plans:  Self Care Goal 11/07/2014  Manage my medications take my medicines as prescribed; bring my medications to every visit  Eat healthy foods drink diet soda or water instead of juice or soda; eat more vegetables; eat foods that are low in salt; eat baked foods instead of fried foods  Be physically active -  Stop smoking -  Meeting treatment goals maintain the current self-care plan    No flowsheet data found.   Care Management & Community Referrals:  Referral 11/07/2014  Referrals made for care management support none needed  Referrals made to community resources none

## 2014-11-07 NOTE — Progress Notes (Signed)
Subjective:   Patient ID: Bradley Sawyer male   DOB: 1956/09/06 58 y.o.   MRN: [id number]  HPI: [name] is a 58 y.o. male w/ PMHx of HTN, HLD, GERD, COPD, and stenosis of innominate artery, now s/p innominate artery enderterectomy, presents to the clinic today for a follow-up visit. Patient was recently discharged from hospital on 10/20/14 after patient was noted to have decreased vision in the right eye, left-sided weakness, and absent radial pulse, found to have innominate artery stenosis and underwent endarterectomy w/ partial sternotomy. Since his hospital discharge, he says he has been doing well. He has been having some mild right-sided chest pain associated w/ her sternotomy scar but he says it has been manageable with his pain medications and feels that it is healing quite well. No further issues. He denies left-sided chest pain, SOB different from his baseline, fever, chills, nausea, headache, lightheadedness, or palpitations. The patient also has a home health nurse that helps him with some day to day things and claims this has been very helpful.  Patient also states he has quit smoking completely and has not had a cigarette for 2-3 months. He is also no longer taking Chantix.   Past Medical History  Diagnosis Date  . Hyperlipidemia   . Hypertension   . GERD (gastroesophageal reflux disease)   . Tobacco abuse   . COPD (chronic obstructive pulmonary disease)     bronchitis  . Disorder of vocal cord   . Cocaine dependence     Last use 1998, attends 3 NA meetings weekly.  . Right shoulder pain     2/2 partial rotator cuff tear, tendinopathy, mild subacromial/subdeltioid bursitis, A/C joint arthropathy per MRI done in 3/07  . Arthritis   . Anxiety   . Compression fracture     L2  . CVA (cerebral infarction)   . Fatty liver   . Hx of cardiovascular stress test     ETT/Lexiscan-Myoview (10/15):  Inferior, inferoapical, apical lateral defect c/w mild ischemia, EF 66%;  Intermediate Risk  . 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 albuterol (PROVENTIL HFA;VENTOLIN HFA) 108 (90 BASE) MCG/ACT inhaler Inhale 2 puffs into the lungs every 6 (six) hours as needed for wheezing. 3 Inhaler 2  . amLODipine (NORVASC) 10 MG tablet Take 1 tablet (10 mg total) by mouth daily. 30 tablet 11  . aspirin EC 81 MG tablet Take 81 mg by mouth daily.    . clopidogrel (PLAVIX) 75 MG tablet Take 1 tablet (75 mg total) by mouth daily. 30 tablet 6  . docusate sodium (COLACE) 100 MG capsule Take 100 mg by mouth 2 (two) times daily.    Marland Kitchen FLUoxetine (PROZAC) 10 MG capsule Take 10 mg by mouth daily.    . Fluticasone-Salmeterol (ADVAIR) 500-50 MCG/DOSE AEPB Inhale 1 puff into the lungs every 12 (twelve) hours. 180 each 4  . gabapentin (NEURONTIN) 400 MG capsule Take 2 capsules (800 mg total) by mouth 3 (three) times daily. 180 capsule 4  . lisinopril-hydrochlorothiazide (PRINZIDE) 20-12.5 MG per tablet Take 1 tablet by mouth daily. 30 tablet 6  . meloxicam (MOBIC) 15 MG tablet Take 15 mg by mouth daily.    . nicotine (NICODERM CQ - DOSED IN MG/24 HOURS) 21 mg/24hr patch Place 21 mg onto the skin daily.    . nitroGLYCERIN (NITROSTAT) 0.4 MG SL tablet Place 1 tablet (0.4 mg total) under the tongue every 5 (  five) minutes as needed for chest pain. 25 tablet 8  . nystatin (MYCOSTATIN) 100000 UNIT/ML suspension Take 5 mLs (500,000 Units total) by mouth 4 (four) times daily. 60 mL 0  . omega-3 acid ethyl esters (LOVAZA) 1 G capsule Take 2 capsules (2 g total) by mouth 2 (two) times daily. 90 capsule 3  . OxyCODONE (OXYCONTIN) 10 mg T12A 12 hr tablet Take 1 tablet (10 mg total) by mouth every 12 (twelve) hours. 30 tablet 0  . oxyCODONE (ROXICODONE) 15 MG immediate release tablet Take 1 tablet (15 mg total) by mouth every 4 (four) hours as needed for moderate pain. 50 tablet 0  . oxyCODONE-acetaminophen (PERCOCET)  7.5-325 MG per tablet Take 1 tablet by mouth every 6 (six) hours as needed for pain. 20 tablet 0  . pantoprazole (PROTONIX) 40 MG tablet Take 1 tablet (40 mg total) by mouth daily. 30 tablet 6  . polyethylene glycol (MIRALAX / GLYCOLAX) packet Take 17 g by mouth daily as needed (for constipation).    . pravastatin (PRAVACHOL) 20 MG tablet Take 1 tablet (20 mg total) by mouth daily. 30 tablet 11  . varenicline (CHANTIX) 0.5 MG tablet Weeks 5-8: Take 1 mg (2 tabs) twice daily (Patient taking differently: Take 2 mg by mouth 2 (two) times daily. Weeks 5-8: Take 1 mg (2 tabs) twice daily) 120 tablet 0   No current facility-administered medications for this visit.    Review of Systems: General: Denies fever, chills, diaphoresis, appetite change and fatigue.  Respiratory: Positive for mild chronic cough, DOE and wheezing.   Cardiovascular: Positive for mild right-sided chest pain. Denies palpitations.  Gastrointestinal: Denies nausea, vomiting, abdominal pain, and diarrhea.  Genitourinary: Denies dysuria, increased frequency, and flank pain. Endocrine: Denies hot or cold intolerance, polyuria, and polydipsia. Musculoskeletal: Denies myalgias, back pain, joint swelling, arthralgias and gait problem.  Skin: Denies pallor, rash and wounds.  Neurological: Denies dizziness, seizures, syncope, weakness, lightheadedness, numbness and headaches.  Psychiatric/Behavioral: Denies mood changes, and sleep disturbances.   Objective:   Physical Exam: Filed Vitals:   11/07/14 1020  BP: 139/72; rechecked in both arms: 130/88 in right, 130/82 in left.  Pulse: 74  Temp: 97.4 F (36.3 C)  TempSrc: Oral  Height: 5\' 11"  (1.803 m)  Weight: 217 lb 9.6 oz (98.703 kg)  SpO2: 97%    General: White male, alert, cooperative, NAD. HEENT: PERRL, EOMI. Moist mucus membranes Neck: Full range of motion without pain, supple, no carotid bruits or lymphadenopathy. Lungs: Clear to ascultation bilaterally, normal work of  respiration, no wheezes, rales, rhonchi Heart: RRR, no murmurs, gallops, or rubs. Partial sternotomy scar, appears to be healing well. No discharge, erythema, or tenderness to palpation.  Abdomen: Soft, non-tender, non-distended, BS + Extremities: No cyanosis, clubbing, or edema. Equal radial pulses bilaterally.  Neurologic: Alert & oriented X3, cranial nerves II-XII intact, strength grossly intact, sensation intact to light touch   Assessment & Plan:   Please see problem based assessment and plan.

## 2014-11-07 NOTE — Assessment & Plan Note (Signed)
BP Readings from Last 3 Encounters:  11/07/14 119/75  11/07/14 130/82; rechecked in both arms: 130/88 in right, 130/82 in left.   10/20/14 109/62    Lab Results  Component Value Date   NA 135* 10/18/2014   K 3.7 10/18/2014   CREATININE 0.87 10/18/2014    Assessment: Blood pressure control: controlled Progress toward BP goal:  at goal Comments: BP well controlled.   Plan: Medications:  continue current medications; Prinzide 20-12.5 qd and Norvasc 10 mg qd. Educational resources provided: brochure (has information) Other plans: RTC in 3 months.

## 2014-11-11 ENCOUNTER — Other Ambulatory Visit: Payer: Self-pay | Admitting: Cardiothoracic Surgery

## 2014-11-11 DIAGNOSIS — I708 Atherosclerosis of other arteries: Secondary | ICD-10-CM

## 2014-11-11 NOTE — Progress Notes (Signed)
Internal Medicine Clinic Attending  Case discussed with Dr. Jones soon after the resident saw the patient.  We reviewed the resident's history and exam and pertinent patient test results.  I agree with the assessment, diagnosis, and plan of care documented in the resident's note. 

## 2014-11-12 ENCOUNTER — Encounter: Payer: Self-pay | Admitting: Cardiothoracic Surgery

## 2014-11-12 ENCOUNTER — Ambulatory Visit (INDEPENDENT_AMBULATORY_CARE_PROVIDER_SITE_OTHER): Payer: Medicaid Other | Admitting: Cardiothoracic Surgery

## 2014-11-12 ENCOUNTER — Ambulatory Visit
Admission: RE | Admit: 2014-11-12 | Discharge: 2014-11-12 | Disposition: A | Discharge status: Home / Self Care | Payer: Medicaid Other | Source: Ambulatory Visit | Attending: Cardiothoracic Surgery | Admitting: Cardiothoracic Surgery

## 2014-11-12 VITALS — BP 138/80 | HR 65 | Resp 20 | Ht 71.0 in | Wt 217.0 lb

## 2014-11-12 DIAGNOSIS — I708 Atherosclerosis of other arteries: Secondary | ICD-10-CM

## 2014-11-12 NOTE — Progress Notes (Signed)
[address]       [place] [address]             [phone]      Bradley Sawyer [facility] Medical Record #[id number] Date of Birth: 1956-11-10  Referring: Elam Dutch, MD Primary Care: Albin Felling, MD  Chief Complaint:   POST OP FOLLOW UP Past Surgical History  Procedure Laterality Date  . Aorta -innomiate bypass N/A 10/15/2014    Procedure: ENDARTERCTOMY OF  INNOMINATE ARTERY ;  Surgeon: Elam Dutch, MD;  Location: [facility];  Service: Vascular;  Laterality: N/A;  . Patch angioplasty Right 10/15/2014    Procedure: PATCH ANGIOPLASTY OF RIGHT SUBCLAVIAN ARTERY , RIGHT COMMON CAROTID ARTERY & INNOMINATE ARTERY;  Surgeon: Elam Dutch, MD;  Location: [facility];  Service: Vascular;  Laterality: Right;  . Endarterectomy Right 10/15/2014    Procedure: ENDARTERECTOMY RIGHT SUBCLAVIAN ARTERY;  Surgeon: Elam Dutch, MD;  Location: [facility];  Service: Vascular;  Laterality: Right;  . Endarterectomy Right 10/15/2014    Procedure: ENDARTERECTOMY RIGHT COMMON CAROTID ARTERY;  Surgeon: Elam Dutch, MD;  Location: [facility];  Service: Vascular;  Laterality: Right;  . Sternotomy N/A 10/15/2014    Procedure: PARTIAL STERNOTOMY & PLATING OF STERNUM;  Surgeon: Elam Dutch, MD;  Location: [facility];  Service: Vascular;  Laterality: N/A;  . Arch aortogram N/A 08/23/2014    Procedure: ARCH AORTOGRAM;  Surgeon: Elam Dutch, MD;  Location: [facility] C[facility] LAB;  Service: Cardiovascular;  Laterality: N/A;  . Carotid angiogram Left 08/23/2014    Procedure: CAROTID ANGIOGRAM;  Surgeon: Elam Dutch, MD;  Location: [facility] CATH LAB;  Service: Cardiovascular;  Laterality: Left;  . Left heart catheterization with coronary angiogram N/A 09/12/2014    Procedure: LEFT HEART CATHETERIZATION WITH CORONARY ANGIOGRAM;  Surgeon: Josue Hector, MD;  Location: [facility] CATH LAB;  Service: Cardiovascular;  Laterality: N/A;   History of Present Illness:     Patient returns following recent  partial sternotomy for extensive endarterectomy of the innominate right common carotid and subclavian arteries. Patient has made good progress postoperatively, denies any neurologic symptoms.     Past Medical History  Diagnosis Date  . Hyperlipidemia   . Hypertension   . GERD (gastroesophageal reflux disease)   . Tobacco abuse   . COPD (chronic obstructive pulmonary disease)     bronchitis  . Disorder of vocal cord   . Cocaine dependence     Last use 1998, attends 3 NA meetings weekly.  . Right shoulder pain     2/2 partial rotator cuff tear, tendinopathy, mild subacromial/subdeltioid bursitis, A/C joint arthropathy per MRI done in 3/07  . Arthritis   . Anxiety   . Compression fracture     L2  . CVA (cerebral infarction)   . Fatty liver   . Hx of cardiovascular stress test     ETT/Lexiscan-Myoview (10/15):  Inferior, inferoapical, apical lateral defect c/w mild ischemia, EF 66%; Intermediate Risk  . 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 Former Smoker -- 1.00 packs/day for 48 years  . Types: Cigarettes  . Quit date: 09/23/2014  Smokeless tobacco  . Never Used    Comment: Started back x 2 weeks.    History  Alcohol Use No     No Known Allergies  Current Outpatient Prescriptions  Medication Sig Dispense Refill  . albuterol (PROVENTIL HFA;VENTOLIN HFA) 108 (90 BASE)  MCG/ACT inhaler Inhale 2 puffs into the lungs every 6 (six) hours as needed for wheezing. 3 Inhaler 2  . amLODipine (NORVASC) 10 MG tablet Take 1 tablet (10 mg total) by mouth daily. 30 tablet 11  . aspirin EC 81 MG tablet Take 81 mg by mouth daily.    Marland Kitchen docusate sodium (COLACE) 100 MG capsule Take 100 mg by mouth 2 (two) times daily.    Marland Kitchen FLUoxetine (PROZAC) 10 MG capsule Take 10 mg by mouth daily.    . Fluticasone-Salmeterol (ADVAIR) 500-50 MCG/DOSE AEPB Inhale 1 puff into the lungs every 12 (twelve) hours. 180 each 4  . gabapentin (NEURONTIN) 400 MG  capsule Take 2 capsules (800 mg total) by mouth 3 (three) times daily. 180 capsule 4  . lisinopril-hydrochlorothiazide (PRINZIDE) 20-12.5 MG per tablet Take 1 tablet by mouth daily. 30 tablet 6  . meloxicam (MOBIC) 15 MG tablet Take 15 mg by mouth daily.    . nitroGLYCERIN (NITROSTAT) 0.4 MG SL tablet Place 1 tablet (0.4 mg total) under the tongue every 5 (five) minutes as needed for chest pain. 25 tablet 8  . omega-3 acid ethyl esters (LOVAZA) 1 G capsule Take 2 capsules (2 g total) by mouth 2 (two) times daily. 90 capsule 3  . oxyCODONE-acetaminophen (PERCOCET) 7.5-325 MG per tablet Take 1 tablet by mouth every 6 (six) hours as needed for pain. 60 tablet 0  . pantoprazole (PROTONIX) 40 MG tablet Take 1 tablet (40 mg total) by mouth daily. 30 tablet 6  . polyethylene glycol (MIRALAX / GLYCOLAX) packet Take 17 g by mouth daily as needed (for constipation).    . pravastatin (PRAVACHOL) 20 MG tablet Take 1 tablet (20 mg total) by mouth daily. 30 tablet 11   No current facility-administered medications for this visit.       Physical Exam: BP 138/80 mmHg  Pulse 65  Resp 20  Ht 5\' 11"  (1.803 m)  Wt 217 lb (98.431 kg)  BMI 30.28 kg/m2  SpO2 98%  General appearance: alert and cooperative Neurologic: intact Heart: regular rate and rhythm, S1, S2 normal, no murmur, click, rub or gallop Lungs: clear to auscultation bilaterally Abdomen: soft, non-tender; bowel sounds normal; no masses,  no organomegaly Extremities: extremities normal, atraumatic, no cyanosis or edema and Homans sign is negative, no sign of DVT Wound: Partial sternotomy is well-healed Patient has full radial and brachial pulses to palpation, easily palpable carotid pulse bilaterally  Diagnostic Studies & Laboratory data:     Recent Radiology Findings:   Dg Chest 2 View  11/12/2014   CLINICAL DATA:  Burning sensation and upper anterior chest, some shortness of breath, former smoking history  EXAM: CHEST  2 VIEW  COMPARISON:   Chest x-ray of 10/19/2014  FINDINGS: No active infiltrate or effusion is seen. Mediastinal and hilar contours are unremarkable. The heart is within normal limits in size. Median sternotomy sutures are noted. No acute abnormality is seen with degenerative change present throughout the thoracic spine.  IMPRESSION: No active cardiopulmonary disease.   Electronically Signed   By: Ivar Drape M.D.   On: 11/12/2014 15:42      Recent Lab Findings: Lab Results  Component Value Date   WBC 16.9* 10/20/2014   HGB 8.7* 10/20/2014   HCT 26.8* 10/20/2014   PLT 261 10/20/2014   GLUCOSE 95 10/18/2014   CHOL 121 12/22/2013   TRIG 142 12/22/2013   HDL 41 12/22/2013   LDLDIRECT 109* 03/16/2012   LDLCALC 52 12/22/2013   ALT  20 10/04/2014   AST 18 10/04/2014   NA 135* 10/18/2014   K 3.7 10/18/2014   CL 93* 10/18/2014   CREATININE 0.87 10/18/2014   BUN 19 10/18/2014   CO2 30 10/18/2014   TSH 1.095 03/12/2013   INR 1.03 10/04/2014   HGBA1C 5.9* 12/21/2013      Assessment / Plan:     Patient doing well following partial sternotomy and extensive endarterectomy of the innominate carotid and subclavian arteries. The patient is making very good progress without neurologic or vascular symptoms. Sternum is healing well I've warned him not to do any heavy lifting for 2-3 months Plan to see him back as needed, he continues to be followed by vascular  surgery   Grace Isaac MD      [address] [place] [address] Office [phone]   Beeper [phone]  11/12/2014 4:08 PM

## 2014-11-21 ENCOUNTER — Other Ambulatory Visit: Payer: Self-pay

## 2014-11-21 ENCOUNTER — Other Ambulatory Visit: Payer: Self-pay | Admitting: Internal Medicine

## 2014-11-21 DIAGNOSIS — G8918 Other acute postprocedural pain: Secondary | ICD-10-CM

## 2014-11-21 MED ORDER — OXYCODONE-ACETAMINOPHEN 7.5-325 MG PO TABS: 1.0000 | ORAL_TABLET | Freq: Three times a day (TID) | ORAL | Status: DC | PRN

## 2014-11-21 NOTE — Telephone Encounter (Signed)
RX refill on Percocet 7.5/325 mg #40/no refills signed by Dr Servando Snare. Pt will pick up at front desk today.

## 2014-11-25 ENCOUNTER — Other Ambulatory Visit: Payer: Self-pay | Admitting: *Deleted

## 2014-11-26 MED ORDER — LISINOPRIL-HYDROCHLOROTHIAZIDE 20-12.5 MG PO TABS: 1.0000 | ORAL_TABLET | Freq: Every day | ORAL | Status: DC

## 2014-11-26 MED ORDER — PANTOPRAZOLE SODIUM 40 MG PO TBEC: 40.0000 mg | DELAYED_RELEASE_TABLET | Freq: Every day | ORAL | Status: DC

## 2014-11-27 ENCOUNTER — Ambulatory Visit (INDEPENDENT_AMBULATORY_CARE_PROVIDER_SITE_OTHER): Payer: Medicaid Other | Admitting: Neurology

## 2014-11-27 ENCOUNTER — Encounter: Payer: Self-pay | Admitting: Neurology

## 2014-11-27 VITALS — BP 141/88 | HR 77 | Ht 71.0 in | Wt 224.8 lb

## 2014-11-27 DIAGNOSIS — I6340 Cerebral infarction due to embolism of unspecified cerebral artery: Secondary | ICD-10-CM

## 2014-11-27 DIAGNOSIS — I639 Cerebral infarction, unspecified: Secondary | ICD-10-CM

## 2014-11-27 NOTE — Patient Instructions (Signed)
I had a long discussion with the patient regarding his stroke, risk factors, secondary stroke prevention strategies and answered questions. Continue aspirin for stroke prevention and strict control of hypertension with blood pressure goal below 130/90 and lipids with LDL cholesterol goal below 100 mg percent. Return for follow-up in 6 months or call earlier if necessary

## 2014-11-27 NOTE — Progress Notes (Signed)
[name] [address] [place]. [place] [address] [phone]       OFFICE FOLLOW-UP   Visit NOTE  [name] Date of Birth:  02-28-1956 Medical Record Number:  [id number]   HPI: 59 year male seen today for first office followup visit after hospital admission for stroke in January 2015. He underwent uncomplicated L2 corpectomy with L1-L3 fusion on 12/18/13. 2 days later he was noted to have some left-sided weakness. While in the waiting with physical therapy as he was noted to be leaning to the left side. He denied any complaints symptoms. CT scan was unremarkable. NIH stroke scale was 6. MRI scan showed scattered embolic strokes involving right frontal, parietal and occipital lobes. He had no known history of rectal fibrillation. Hemoglobin A1c was 5.9. Total cholesterol was 121, HDL 41 and LDL 52 mg percent. Urine drug screen was negative. MRI of the brain showed no large vessel stenosis. Transthoracic echo showed normal ejection fraction. Carotid Doppler showed mild 1-39% right ICA and 49% left ICA stenosis. EKG and telemetry monitoring revealed sinus rhythm. Patient was started on aspirin for stroke prevention and consult to quit tobacco. He had remote history of cocaine abuse but had quit in 1998. Patient was discharged home with home therapy and states has done well his left-sided weakness has normalized. He is no residual deficits. He still has some back pain which is his main limiting factor. He is back to his neurological baseline. He is here today for TCD bubble study. Update 12/07/2013 : He returns for follow-up after last visit 6 months ago. He continues to do well without recurrent stroke or TIA symptoms. 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 Oneida Sawyer and   Bradley Sawyer on 10/15/2014 which went well. He has not had any recurrent stroke or TIA symptoms. He states his blood pressure and cholesterol control has been good. ROS:   14 system review of systems is is negative for any complaints today  PMH:  Past Medical History  Diagnosis Date  . Hyperlipidemia   . Hypertension   . GERD (gastroesophageal reflux disease)   . Tobacco abuse   . COPD (chronic obstructive pulmonary disease)     bronchitis  . Disorder of vocal cord   . Cocaine dependence     Last use 1998, attends 3 NA meetings weekly.  . Right shoulder pain     2/2 partial rotator cuff tear, tendinopathy, mild subacromial/subdeltioid bursitis, A/C joint arthropathy per MRI done in 3/07  . Arthritis   . Anxiety   . Compression fracture     L2  . CVA (cerebral infarction)   . Fatty liver   . Hx of cardiovascular stress test     ETT/Lexiscan-Myoview (10/15):  Inferior, inferoapical, apical lateral defect c/w mild ischemia, EF 66%; Intermediate Risk  . 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 Marital Status: Divorced    Spouse Name: N/A    Number of Children: 1  . Years of Education: GED   Occupational History  . Disabled    Social History Main Topics  . Smoking status: Former Smoker -- 1.00 packs/day for 48 years    Types: Cigarettes    Quit date: 09/23/2014  . Smokeless tobacco: Never Used  Comment: Started back x 2 weeks.  . Alcohol Use: No  . Drug Use: No     Comment: no recent use  . Sexual Activity: Not on file   Other Topics Concern  . Not on file   Social History Narrative   Patient is single with one child.   Patient is right handed.   Patient has his GED.   Patient drinks 5 or more cups daily.    Medications:   Current Outpatient Prescriptions on File Prior to Visit  Medication Sig Dispense Refill  . albuterol (PROVENTIL HFA;VENTOLIN HFA) 108 (90 BASE) MCG/ACT inhaler Inhale 2 puffs  into the lungs every 6 (six) hours as needed for wheezing. 3 Inhaler 2  . amLODipine (NORVASC) 10 MG tablet Take 1 tablet (10 mg total) by mouth daily. 30 tablet 11  . aspirin EC 81 MG tablet Take 81 mg by mouth daily.    Marland Kitchen docusate sodium (COLACE) 100 MG capsule Take 100 mg by mouth 2 (two) times daily.    Marland Kitchen FLUoxetine (PROZAC) 10 MG capsule Take 10 mg by mouth daily.    . Fluticasone-Salmeterol (ADVAIR) 500-50 MCG/DOSE AEPB Inhale 1 puff into the lungs every 12 (twelve) hours. 180 each 4  . gabapentin (NEURONTIN) 400 MG capsule Take 2 capsules (800 mg total) by mouth 3 (three) times daily. 180 capsule 4  . lisinopril-hydrochlorothiazide (PRINZIDE) 20-12.5 MG per tablet Take 1 tablet by mouth daily. 30 tablet 6  . meloxicam (MOBIC) 15 MG tablet TAKE 1 TABLET BY MOUTH EVERY DAY 30 tablet 1  . nitroGLYCERIN (NITROSTAT) 0.4 MG SL tablet Place 1 tablet (0.4 mg total) under the tongue every 5 (five) minutes as needed for chest pain. 25 tablet 8  . omega-3 acid ethyl esters (LOVAZA) 1 G capsule Take 2 capsules (2 g total) by mouth 2 (two) times daily. 90 capsule 3  . oxyCODONE-acetaminophen (PERCOCET) 7.5-325 MG per tablet Take 1 tablet by mouth every 8 (eight) hours as needed for pain. 40 tablet 0  . pantoprazole (PROTONIX) 40 MG tablet Take 1 tablet (40 mg total) by mouth daily. 30 tablet 6  . polyethylene glycol (MIRALAX / GLYCOLAX) packet Take 17 g by mouth daily as needed (for constipation).    . pravastatin (PRAVACHOL) 20 MG tablet Take 1 tablet (20 mg total) by mouth daily. 30 tablet 11   No current facility-administered medications on file prior to visit.    Allergies:  No Known Allergies  Physical Exam General: well developed, well nourished obese caucasian male, seated, in no evident distress Head: head normocephalic and atraumatic. Orohparynx benign Neck: supple with no carotid or supraclavicular bruits. Healed right lower neck and midline sternotomy scar Cardiovascular: regular rate  and rhythm, no murmurs Musculoskeletal: no deformity Skin:  no rash/petichiae. Left palm nodular lesion keloid versus lipoma Vascular:  Normal pulses all extremities Filed Vitals:   11/27/14 1503  BP: 141/88  Pulse: 77   Neurologic Exam Mental Status: Awake and fully alert. Oriented to place and time. Recent and remote memory intact. Attention span, concentration and fund of knowledge appropriate. Mood and affect appropriate.  Cranial Nerves: Fundoscopic exam reveals sharp disc margins. Pupils equal, briskly reactive to light. Extraocular movements full without nystagmus. Visual fields full to confrontation. Hearing intact. Facial sensation intact. Face, tongue, palate moves normally and symmetrically.  Motor: Normal bulk and tone. Normal strength in all tested extremity muscles. Sensory.: intact to touch and pinprick and vibratory sensation.  Coordination: Rapid alternating movements normal in all  extremities. Finger-to-nose and heel-to-shin performed accurately bilaterally. Gait and Station: Arises from chair without difficulty. Stance is normal. Gait demonstrates normal stride length and balance . Able to heel, toe and tandem walk without difficulty.  Reflexes: 1+ and symmetric. Toes downgoing.   NIHSS  0 Modified Rankin  1   ASSESSMENT: 43 year Caucasian male with embolic right hemispheric infarcts without identified source in January 2015 2 days  after back surgery. Interestingly he was found to have critical right subclavian and brachiocephalic stenosis later in November 2015 which perhaps may have been the etiology of his stroke Vascular risk factor of hyperlipidemia and mild obesity only. Status post recent right subclavian and brachiocephalic endarterectomy with patch angioplasty    PLAN:  I had a long discussion with the patient regarding his stroke, risk factors, secondary stroke prevention strategies and answered questions. Continue aspirin for stroke prevention and strict  control of hypertension with blood pressure goal below 130/90 and lipids with LDL cholesterol goal below 100 mg percent. Return for follow-up in 6 months or call earlier if necessary Antony Contras, MD      [facility] [place]. [place] [address]. [phone]           I

## 2014-12-18 ENCOUNTER — Encounter: Payer: Self-pay | Admitting: Vascular Surgery

## 2014-12-19 ENCOUNTER — Encounter: Payer: Self-pay | Admitting: Vascular Surgery

## 2014-12-19 ENCOUNTER — Ambulatory Visit (INDEPENDENT_AMBULATORY_CARE_PROVIDER_SITE_OTHER): Payer: Self-pay | Admitting: Vascular Surgery

## 2014-12-19 ENCOUNTER — Ambulatory Visit
Admission: RE | Admit: 2014-12-19 | Discharge: 2014-12-19 | Disposition: A | Discharge status: Home / Self Care | Payer: Medicaid Other | Source: Ambulatory Visit | Attending: Vascular Surgery | Admitting: Vascular Surgery

## 2014-12-19 VITALS — BP 138/80 | HR 90 | Temp 97.8°F | Resp 20 | Ht 71.0 in | Wt 230.0 lb

## 2014-12-19 DIAGNOSIS — I771 Stricture of artery: Secondary | ICD-10-CM

## 2014-12-19 DIAGNOSIS — G8918 Other acute postprocedural pain: Secondary | ICD-10-CM

## 2014-12-19 DIAGNOSIS — Z48812 Encounter for surgical aftercare following surgery on the circulatory system: Secondary | ICD-10-CM

## 2014-12-19 DIAGNOSIS — I708 Atherosclerosis of other arteries: Secondary | ICD-10-CM

## 2014-12-19 MED ORDER — IOHEXOL 350 MG/ML SOLN
75.0000 mL | Freq: Once | INTRAVENOUS | Status: AC | PRN
  Administered 2014-12-19: 75 mL via INTRAVENOUS

## 2014-12-19 NOTE — Patient Instructions (Signed)
Pursed Lip Breathing Some long-term respiratory conditions like COPD and asthma can make it hard to release all the air in your lungs when you breathe out (exhale). This can result in oxygen-poor air building up in your lungs (air trapping) and make it hard to fill your lungs with fresh air during each breath. This can also cause you to feel short of breath.  Pursed lip breathing can help to relieve some of this shortness of breath. By keeping your airways open for a longer amount of time during your breath out, you can empty more air out of your lungs. This will make more space for fresh air during the next breath in (inhalation).  HOW TO PERFORM PURSED LIP BREATHING  Close your mouth.  Breathe in through your nose, taking a normal-sized breath. The rate of breathing in may be your normal rate, or you can try breathing in with a slow count to two or three if you feel you are not getting enough air.  Pucker your lips as if you were going to whistle.  Gently tighten your stomach muscles to help push the air out.  Breathe out slowly through your pursed lips. You should breathe out at least twice as long as you breathe in.   Be sure you breathe out all of the air, but do not force the air out. GET HELP IF: Your shortness of breath gets worse. GET HELP RIGHT AWAY IF:  You are very short of breath. Document Released: 08/17/2008 Document Revised: 03/25/2014 Document Reviewed: 06/20/2013 [facility] Patient Information 2015 [place], [place]. This information is not intended to replace advice given to you by your health care provider. Make sure you discuss any questions you have with your health care provider.      Shortness of Breath Shortness of breath means you have trouble breathing. Shortness of breath needs medical care right away. HOME CARE   Do not smoke.  Avoid being around chemicals or things (paint fumes, dust) that may bother your breathing.  Rest as needed. Slowly begin your  normal activities.  Only take medicines as told by your doctor.  Keep all doctor visits as told. GET HELP RIGHT AWAY IF:   Your shortness of breath gets worse.  You feel lightheaded, pass out (faint), or have a cough that is not helped by medicine.  You cough up blood.  You have pain with breathing.  You have pain in your chest, arms, shoulders, or belly (abdomen).  You have a fever.  You cannot walk up stairs or exercise the way you normally do.  You do not get better in the time expected.  You have a hard time doing normal activities even with rest.  You have problems with your medicines.  You have any new symptoms. MAKE SURE YOU:  Understand these instructions.  Will watch your condition.  Will get help right away if you are not doing well or get worse. Document Released: 04/26/2008 Document Revised: 11/13/2013 Document Reviewed: 01/24/2012 [facility] Patient Information 2015 [place], [place]. This information is not intended to replace advice given to you by your health care provider. Make sure you discuss any questions you have with your health care provider.

## 2014-12-19 NOTE — Addendum Note (Signed)
Addended by: Mena Goes on: 12/19/2014 04:26 PM   Modules accepted: Orders

## 2014-12-19 NOTE — Progress Notes (Signed)
POST OPERATIVE OFFICE NOTE    CC:  F/u for surgery  HPI:  This is a 59 y.o. male who is s/p Partial sternotomy for exposure of innominate artery and common carotid artery and subclavian artery with endarterectomy of innominate artery, subclavian artery, and common carotid artery with patch angioplasty.  Plating of sternum on 10/15/13 by Dr. Oneida Alar and Dr. Servando Snare.  He is doing well with really no pain at this point. He does complain of pain in his left elbow which is swollen. He denies any symptoms of TIA amaurosis or stroke. He has no incisional drainage.  Most likely this stroke he had approximately one year ago was related to the innominate artery stenosis.  No Known Allergies    Current Outpatient Prescriptions   Medication  Sig  Dispense  Refill   .  albuterol (PROVENTIL HFA;VENTOLIN HFA) 108 (90 BASE) MCG/ACT inhaler  Inhale 2 puffs into the lungs every 6 (six) hours as needed for wheezing.  3 Inhaler  2   .  amLODipine (NORVASC) 10 MG tablet  Take 1 tablet (10 mg total) by mouth daily.  30 tablet  11   .  aspirin EC 81 MG tablet  Take 81 mg by mouth daily.       Marland Kitchen  docusate sodium (COLACE) 100 MG capsule  Take 100 mg by mouth 2 (two) times daily.       Marland Kitchen  FLUoxetine (PROZAC) 10 MG capsule  Take 10 mg by mouth daily.       .  Fluticasone-Salmeterol (ADVAIR) 500-50 MCG/DOSE AEPB  Inhale 1 puff into the lungs every 12 (twelve) hours.  180 each  4   .  gabapentin (NEURONTIN) 400 MG capsule  Take 2 capsules (800 mg total) by mouth 3 (three) times daily.  180 capsule  4   .  lisinopril-hydrochlorothiazide (PRINZIDE) 20-12.5 MG per tablet  Take 1 tablet by mouth daily.  30 tablet  6   .  meloxicam (MOBIC) 15 MG tablet  Take 15 mg by mouth daily.       .  nicotine (NICODERM CQ - DOSED IN MG/24 HOURS) 21 mg/24hr patch  Place 21 mg onto the skin daily.       .  nitroGLYCERIN (NITROSTAT) 0.4 MG SL tablet  Place 1 tablet (0.4 mg total) under the tongue every 5 (five) minutes as needed for chest  pain.  25 tablet  8   .  nystatin (MYCOSTATIN) 100000 UNIT/ML suspension  Take 5 mLs (500,000 Units total) by mouth 4 (four) times daily.  60 mL  0   .  omega-3 acid ethyl esters (LOVAZA) 1 G capsule  Take 2 capsules (2 g total) by mouth 2 (two) times daily.  90 capsule  3   .  OxyCODONE (OXYCONTIN) 10 mg T12A 12 hr tablet  Take 1 tablet (10 mg total) by mouth every 12 (twelve) hours.  30 tablet  0   .  oxyCODONE (ROXICODONE) 15 MG immediate release tablet  Take 1 tablet (15 mg total) by mouth every 4 (four) hours as needed for moderate pain.  50 tablet  0   .  oxyCODONE-acetaminophen (PERCOCET) 7.5-325 MG per tablet  Take 1 tablet by mouth every 6 (six) hours as needed for pain.  60 tablet  0   .  pantoprazole (PROTONIX) 40 MG tablet  Take 1 tablet (40 mg total) by mouth daily.  30 tablet  6   .  polyethylene glycol (MIRALAX / GLYCOLAX) packet  Take 17 g by mouth daily as needed (for constipation).       .  pravastatin (PRAVACHOL) 20 MG tablet  Take 1 tablet (20 mg total) by mouth daily.  30 tablet  11      No current facility-administered medications for this visit.      ROS:  See HPI  Physical Exam:    Filed Vitals:   12/19/14 1235  BP: 138/80  Pulse: 90  Temp: 97.8 F (36.6 C)  TempSrc: Oral  Resp: 20  Height: 5\' 11"  (1.803 m)  Weight: 230 lb (104.327 kg)  SpO2: 96%   Incision:  Healing nicely Extremities:  2+ bilateral radial and carotid pulses, 5 cm nontender fluid-filled mass left elbow Neuro: in tact.  Data: CT Angio the chest was reviewed. This shows widely patent endarterectomy site of the innominate artery. The right subclavian or right common carotid arteries are widely patent. The left common and left subclavian arteries are widely patent.  Assessment/Plan:  doing well status post innominate endarterectomy. Patient has symmetric and palpable radial and carotid pulses. No carotid bruit. Incision is healing well without drainage. The patient will follow-up in 6 months  with bilateral carotid duplex. The patient will follow-up with his primary care physician about a swelling of his left elbow. Most likely this is on the olecranon bursa irritation. The patient also intermittently is using home oxygen. He'll continue to use this for at least another month and decide whether or not he still needs to continue to use it.  Ruta Hinds, MD [facility] Office: [phone] Pager: [phone]

## 2014-12-23 ENCOUNTER — Encounter: Payer: Self-pay | Admitting: Internal Medicine

## 2014-12-23 ENCOUNTER — Telehealth: Payer: Self-pay | Admitting: *Deleted

## 2014-12-23 ENCOUNTER — Ambulatory Visit (INDEPENDENT_AMBULATORY_CARE_PROVIDER_SITE_OTHER): Payer: Medicaid Other | Admitting: Internal Medicine

## 2014-12-23 VITALS — BP 164/92 | HR 70 | Temp 97.6°F | Ht 71.0 in | Wt 227.9 lb

## 2014-12-23 DIAGNOSIS — M7022 Olecranon bursitis, left elbow: Secondary | ICD-10-CM | POA: Insufficient documentation

## 2014-12-23 DIAGNOSIS — M25422 Effusion, left elbow: Secondary | ICD-10-CM

## 2014-12-23 NOTE — Patient Instructions (Signed)
General Instructions:   Please try to bring all your medicines next time. This will help [place] keep you safe from mistakes.   Progress Toward Treatment Goals:  Treatment Goal 11/07/2014  Blood pressure at goal  Stop smoking -    Self Care Goals & Plans:  Self Care Goal 12/23/2014  Manage my medications take my medicines as prescribed; bring my medications to every visit; refill my medications on time  Eat healthy foods eat more vegetables; eat foods that are low in salt; eat baked foods instead of fried foods  Be physically active -  Stop smoking -  Meeting treatment goals -    No flowsheet data found.   Care Management & Community Referrals:  Referral 11/07/2014  Referrals made for care management support none needed  Referrals made to community resources none

## 2014-12-23 NOTE — Telephone Encounter (Signed)
Pt walked in to clinic and stated not sure where to go concerning left elbow growth. Getting larger. Does not bother him unless he hits that area. To see Dr Algis Liming 12/23/14 9:45AM. Pt aware. Hilda Blades Deetta Siegmann RN 12/23/14 9:40AM

## 2014-12-23 NOTE — Progress Notes (Signed)
Subjective:   Patient ID: Bradley Sawyer male   DOB: 1956/08/28 59 y.o.   MRN: [id number]  HPI: [name] is a 59 y.o. man pmh as listed below presents with elbow pain.   Pt states this started about a month ago. He states that there was a small bump that started on the medial aspect of his left elbow and then over the past week it has moved to the lateral aspect of the same left elbow. He denied any episodes of redness, warmth, or pain. He has not had any injury or hardware placed in that joint. He has not had similar episodes like this in the past. He denied any systemic symptoms of fever/chills, anorexia, abdominal pain, or rashes. He does not have a history of gout or recent trauma to the area. He denies any arm weakness or loss of control or sensation in that arm.  Past Medical History  Diagnosis Date  . Hyperlipidemia   . Hypertension   . GERD (gastroesophageal reflux disease)   . Tobacco abuse   . COPD (chronic obstructive pulmonary disease)     bronchitis  . Disorder of vocal cord   . Cocaine dependence     Last use 1998, attends 3 NA meetings weekly.  . Right shoulder pain     2/2 partial rotator cuff tear, tendinopathy, mild subacromial/subdeltioid bursitis, A/C joint arthropathy per MRI done in 3/07  . Arthritis   . Anxiety   . Compression fracture     L2  . CVA (cerebral infarction)   . Fatty liver   . Hx of cardiovascular stress test     ETT/Lexiscan-Myoview (10/15):  Inferior, inferoapical, apical lateral defect c/w mild ischemia, EF 66%; Intermediate Risk  . 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 albuterol (PROVENTIL HFA;VENTOLIN HFA) 108 (90 BASE) MCG/ACT inhaler Inhale 2 puffs into the lungs every 6 (six) hours as needed for wheezing. 3 Inhaler 2  . amLODipine (NORVASC) 10 MG tablet Take 1 tablet (10 mg total) by mouth daily. 30 tablet 11  . aspirin EC 81 MG  tablet Take 81 mg by mouth daily.    Marland Kitchen docusate sodium (COLACE) 100 MG capsule Take 100 mg by mouth 2 (two) times daily.    Marland Kitchen FLUoxetine (PROZAC) 10 MG capsule Take 10 mg by mouth daily.    . Fluticasone-Salmeterol (ADVAIR) 500-50 MCG/DOSE AEPB Inhale 1 puff into the lungs every 12 (twelve) hours. 180 each 4  . gabapentin (NEURONTIN) 400 MG capsule Take 2 capsules (800 mg total) by mouth 3 (three) times daily. 180 capsule 4  . lisinopril-hydrochlorothiazide (PRINZIDE) 20-12.5 MG per tablet Take 1 tablet by mouth daily. 30 tablet 6  . meloxicam (MOBIC) 15 MG tablet TAKE 1 TABLET BY MOUTH EVERY DAY 30 tablet 1  . nitroGLYCERIN (NITROSTAT) 0.4 MG SL tablet Place 1 tablet (0.4 mg total) under the tongue every 5 (five) minutes as needed for chest pain. 25 tablet 8  . omega-3 acid ethyl esters (LOVAZA) 1 G capsule Take 2 capsules (2 g total) by mouth 2 (two) times daily. 90 capsule 3  . oxyCODONE-acetaminophen (PERCOCET) 7.5-325 MG per tablet Take 1 tablet by mouth every 8 (eight) hours as needed for pain. 40 tablet 0  . pantoprazole (PROTONIX) 40 MG tablet Take 1 tablet (40 mg total) by mouth daily. 30 tablet 6  . polyethylene glycol (MIRALAX / GLYCOLAX) packet Take 17 g by mouth daily  as needed (for constipation).    . pravastatin (PRAVACHOL) 20 MG tablet Take 1 tablet (20 mg total) by mouth daily. 30 tablet 11   No current facility-administered medications for this visit.   Family History  Problem Relation Age of Onset  . Diabetes Paternal Grandmother     1 st degree relatives  . Heart disease Father   . Diabetes Brother   . Heart disease Brother   . Muscular dystrophy Brother   . Muscular dystrophy Sister   . Heart attack Father   . Hypertension Paternal Grandmother    History   Social History  . Marital Status: Divorced    Spouse Name: N/A    Number of Children: 1  . Years of Education: GED   Occupational History  . Disabled    Social History Main Topics  . Smoking status:  Former Smoker -- 1.00 packs/day for 48 years    Types: Cigarettes    Quit date: 09/23/2014  . Smokeless tobacco: Never Used     Comment: Started back x 2 weeks.  . Alcohol Use: No  . Drug Use: No     Comment: no recent use  . Sexual Activity: None   Other Topics Concern  . None   Social History Narrative   Patient is single with one child.   Patient is right handed.   Patient has his GED.   Patient drinks 5 or more cups daily.   Review of Systems: Pertinent items are noted in HPI. Objective:  Physical Exam: Filed Vitals:   12/23/14 0954  BP: 164/92  Pulse: 70  Temp: 97.6 F (36.4 C)  TempSrc: Oral  Height: 5\' 11"  (1.803 m)  Weight: 227 lb 14.4 oz (103.375 kg)  SpO2: 96%   General: sitting in chair, NAD HEENT: PERRL, EOMI, no scleral icterus Cardiac: RRR, no rubs, murmurs or gallops Pulm: clear to auscultation bilaterally, moving normal volumes of air Abd: soft, nontender, nondistended, BS present Ext: warm and well perfused, no pedal edema MSK: 3cm circular mass on the lateral aspect of the left elbow area of olecranon, no erythema or warmth, no axillary LAD, 5 out of 5 left grip strength and triceps and biceps strength, normal sensation, both passive and active flexion and extension without limitation or pain Neuro: alert and oriented X3, cranial nerves II-XII grossly intact  Assessment & Plan:  Please see problem oriented charting  Pt discussed with Dr. Lynnae January

## 2014-12-23 NOTE — Progress Notes (Signed)
Internal Medicine Clinic Attending  I saw and evaluated the patient.  I personally confirmed the key portions of the history and exam documented by Dr. Sadek and I reviewed pertinent patient test results.  The assessment, diagnosis, and plan were formulated together and I agree with the documentation in the resident's note. 

## 2014-12-23 NOTE — Assessment & Plan Note (Signed)
The patient has swelling in the area of the olecranon bursa. He has no flexion or extension limitations along with no signs of active infection to suggest bursitis. The patient does admit to repetitive small trauma to that elbow and leaning on the elbow on hard surfaces. The patient does not seem to have any components of gout or lipoma but they were considered in the differential. -Supportive care and elbow brace provided to the patient in clinic to prevent repetitive trauma -Patient was advised to return immediately if redness, pain, or limited range of motion were to occur for concern of possible infection and need for reevaluation

## 2014-12-29 NOTE — Progress Notes (Signed)
Patient ID: Bradley Sawyer, male   DOB: 09-18-1956, 59 y.o.   MRN: [id number]    Cardiology Office Note    Date:  12/30/2014   ID:  Bradley Sawyer, DOB 1955-12-18, MRN [id number]  PCP:  Albin Felling, MD  Cardiologist:  Dr. Jenkins Rouge      History of Present Illness: Bradley Sawyer is a 59 y.o. male a history of HTN, HL, COPD, tobacco abuse. He was seen iby me in 2013  after a visit to the emergency room with chest pain. Cardiac CTA demonstrated nonobstructive CAD.  Patient had lumbar spine surgery in 11/2013. Postoperative course was complicated by embolic right hemispheric CVA.  The patient was recently evaluated by Dr. Oneida Alar for vascular surgery due to right subclavian stenosis.  This was found incidentally due to blood pressure discrepancy.  Patient did report lower visual field cut in the right eye at times. This was felt to possibly be related to his subclavian stenosis. Post partial sternotomy by Dr Fields/Gerhardt with endarderectomy of innominate, carotid and subclavian  Studies:  - TEE (2/15):  EF 55-60%, normal wall motion, normal caliber aortic arch and descending thoracic aorta with grade 3 plaque, mild LAE, normal RVSF, no cardiac source of emboli  - Echo (1/15):  EF 55-60%, normal wall motion  - Carotid US (9/15):  Bilateral ICA <40%; known right subclavian steal  - Cardiac CTA (11/13):  Ca Score 419, LM 0-25%, LAD 25-50%, ostial D2 0-25%, prox CFX 0-25%, OM1 0-25%, RCA 0-25%; small R lung nodules (4 mm) >> FU CT recommended 1 year (unless low risk for bronchogenic CA); severe hepatic steatosis - FU CT [facility] in 8/14: no masses identified 10/15  myovue  Overall Impression: Intermediate risk stress nuclear study with a medium-sized (Extent 13%), moderate severity partially reversible (SDS 4) inferior, inferoapical and apical lateral defect consistent with mild ischemia. Poor exercise effort and marked hypertensive response to exercise, with PVC's and ventricular  trigeminy.  LV Ejection Fraction: 66%. LV Wall Motion: Normal Wall Motion  Cath 09/12/14  No CAD and normal EF    Recent Labs/Images:   Recent Labs  10/04/14 1000  10/18/14 0620  10/20/14 0315  NA 141  < > 135*  --   --   K 3.4*  < > 3.7  --   --   BUN 9  < > 19  --   --   CREATININE 0.76  < > 0.87  --   --   ALT 20  --   --   --   --   HGB 12.4*  < > 10.4*  < > 8.7*  < > = values in this interval not displayed.   No results found.   Wt Readings from Last 3 Encounters:  12/30/14 102.059 kg (225 lb)  12/23/14 103.375 kg (227 lb 14.4 oz)  12/19/14 104.327 kg (230 lb)     Past Medical History  Diagnosis Date  . Hyperlipidemia   . Hypertension   . GERD (gastroesophageal reflux disease)   . Tobacco abuse   . COPD (chronic obstructive pulmonary disease)     bronchitis  . Disorder of vocal cord   . Cocaine dependence     Last use 1998, attends 3 NA meetings weekly.  . Right shoulder pain     2/2 partial rotator cuff tear, tendinopathy, mild subacromial/subdeltioid bursitis, A/C joint arthropathy per MRI done in 3/07  . Arthritis   . Anxiety   . Compression fracture  L2  . CVA (cerebral infarction)   . Fatty liver   . Hx of cardiovascular stress test     ETT/Lexiscan-Myoview (10/15):  Inferior, inferoapical, apical lateral defect c/w mild ischemia, EF 66%; Intermediate Risk  . 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 albuterol (PROVENTIL HFA;VENTOLIN HFA) 108 (90 BASE) MCG/ACT inhaler Inhale 2 puffs into the lungs every 6 (six) hours as needed for wheezing. 3 Inhaler 2  . amLODipine (NORVASC) 10 MG tablet Take 1 tablet (10 mg total) by mouth daily. 30 tablet 11  . aspirin EC 81 MG tablet Take 81 mg by mouth daily.    Marland Kitchen FLUoxetine (PROZAC) 10 MG capsule Take 10 mg by mouth daily.    . Fluticasone-Salmeterol (ADVAIR) 500-50 MCG/DOSE AEPB Inhale 1 puff into the lungs every 12  (twelve) hours. 180 each 4  . gabapentin (NEURONTIN) 400 MG capsule Take 2 capsules (800 mg total) by mouth 3 (three) times daily. 180 capsule 4  . lisinopril-hydrochlorothiazide (PRINZIDE) 20-12.5 MG per tablet Take 1 tablet by mouth daily. 30 tablet 6  . meloxicam (MOBIC) 15 MG tablet TAKE 1 TABLET BY MOUTH EVERY DAY 30 tablet 1  . nitroGLYCERIN (NITROSTAT) 0.4 MG SL tablet Place 1 tablet (0.4 mg total) under the tongue every 5 (five) minutes as needed for chest pain. 25 tablet 8  . omega-3 acid ethyl esters (LOVAZA) 1 G capsule Take 2 capsules (2 g total) by mouth 2 (two) times daily. 90 capsule 3  . oxyCODONE-acetaminophen (PERCOCET) 7.5-325 MG per tablet Take 1 tablet by mouth every 8 (eight) hours as needed for pain. 40 tablet 0  . pantoprazole (PROTONIX) 40 MG tablet Take 1 tablet (40 mg total) by mouth daily. 30 tablet 6  . polyethylene glycol (MIRALAX / GLYCOLAX) packet Take 17 g by mouth daily as needed (for constipation).    . pravastatin (PRAVACHOL) 20 MG tablet Take 1 tablet (20 mg total) by mouth daily. 30 tablet 11   No current facility-administered medications for this visit.     Allergies:   Review of patient's allergies indicates no known allergies.   Social History:  The patient  reports that he quit smoking about 3 months ago. His smoking use included Cigarettes. He has a 48 pack-year smoking history. He has never used smokeless tobacco. He reports that he does not drink alcohol or use illicit drugs.   Family History:  The patient's family history includes Diabetes in his brother and paternal grandmother; Heart attack in his father; Heart disease in his brother and father; Hypertension in his paternal grandmother; Muscular dystrophy in his brother and sister.   ROS:  Please see the history of present illness.      All other systems reviewed and negative.    PHYSICAL EXAM: VS:  BP 148/78 mmHg  Pulse 62  Ht 5\' 11"  (1.803 m)  Wt 102.059 kg (225 lb)  BMI 31.39 kg/m2 Well  nourished, well developed, in no acute distress HEENT: normal Neck: no JVD Cardiac:  normal S1, S2; RRR; no murmur Lungs:  Decreased breath sounds bilaterally, no wheezing, rhonchi or rales Abd: soft, nontender, no hepatomegaly Ext: no edema Skin: warm and dry Neuro:  CNs 2-12 intact, no focal abnormalities noted  EKG:   08/28/14  NSR, HR 67, normal axis, no ST changes      ASSESSMENT AND PLAN:  1.  CAD  Cath 10/15 no obstructive disease despite abnormal myovue no  periop complications after sternotomy  2.  PAD (peripheral artery disease): Followup with Dr. Oneida Alar as planned.   3  Essential hypertension: Controlled.   4.  Hyperlipemia: Continue statin.   6.  TOBACCO ABUSE: He is trying to quit.   7.  Chronic obstructive pulmonary disease: Continue Proventil, Advair.       Jenkins Rouge

## 2014-12-30 ENCOUNTER — Ambulatory Visit (INDEPENDENT_AMBULATORY_CARE_PROVIDER_SITE_OTHER): Payer: Medicaid Other | Admitting: Cardiovascular Disease

## 2014-12-30 ENCOUNTER — Encounter: Payer: Self-pay | Admitting: Cardiovascular Disease

## 2014-12-30 VITALS — BP 148/78 | HR 62 | Ht 71.0 in | Wt 225.0 lb

## 2014-12-30 DIAGNOSIS — I2510 Atherosclerotic heart disease of native coronary artery without angina pectoris: Secondary | ICD-10-CM

## 2014-12-30 DIAGNOSIS — J410 Simple chronic bronchitis: Secondary | ICD-10-CM

## 2014-12-30 DIAGNOSIS — I708 Atherosclerosis of other arteries: Secondary | ICD-10-CM

## 2014-12-30 DIAGNOSIS — I10 Essential (primary) hypertension: Secondary | ICD-10-CM

## 2014-12-30 DIAGNOSIS — I2583 Coronary atherosclerosis due to lipid rich plaque: Secondary | ICD-10-CM

## 2014-12-30 NOTE — Assessment & Plan Note (Signed)
Well controlled.  Continue current medications and low sodium Dash type diet.    

## 2014-12-30 NOTE — Patient Instructions (Signed)
Your physician wants you to follow-up in: YEAR WITH DR NISHAN  You will receive a reminder letter in the mail two months in advance. If you don't receive a letter, please call our office to schedule the follow-up appointment.  Your physician recommends that you continue on your current medications as directed. Please refer to the Current Medication list given to you today. 

## 2014-12-30 NOTE — Assessment & Plan Note (Signed)
Improved no wheezing stopped smoking before sternotomy and has continue to abstain

## 2014-12-30 NOTE — Assessment & Plan Note (Signed)
Stable with no angina and good activity level.  Continue medical Rx  

## 2014-12-30 NOTE — Assessment & Plan Note (Signed)
Post surgical repair BP's equal in arms F/u duplex evaluation Dr Oneida Alar no recurrent TIA;s

## 2015-01-23 ENCOUNTER — Other Ambulatory Visit: Payer: Self-pay | Admitting: Internal Medicine

## 2015-01-28 ENCOUNTER — Other Ambulatory Visit: Payer: Self-pay | Admitting: Internal Medicine

## 2015-01-28 NOTE — Telephone Encounter (Signed)
Has appt with PCP 3/22. Will refill. Cr nl

## 2015-02-11 ENCOUNTER — Ambulatory Visit (INDEPENDENT_AMBULATORY_CARE_PROVIDER_SITE_OTHER): Payer: Medicaid Other | Admitting: Internal Medicine

## 2015-02-11 VITALS — BP 158/98 | HR 78 | Temp 97.4°F | Ht 71.0 in | Wt 228.1 lb

## 2015-02-11 DIAGNOSIS — K219 Gastro-esophageal reflux disease without esophagitis: Secondary | ICD-10-CM

## 2015-02-11 DIAGNOSIS — I10 Essential (primary) hypertension: Secondary | ICD-10-CM

## 2015-02-11 DIAGNOSIS — D509 Iron deficiency anemia, unspecified: Secondary | ICD-10-CM

## 2015-02-11 DIAGNOSIS — Z720 Tobacco use: Secondary | ICD-10-CM

## 2015-02-11 DIAGNOSIS — E785 Hyperlipidemia, unspecified: Secondary | ICD-10-CM

## 2015-02-11 DIAGNOSIS — D649 Anemia, unspecified: Secondary | ICD-10-CM

## 2015-02-11 DIAGNOSIS — S32020D Wedge compression fracture of second lumbar vertebra, subsequent encounter for fracture with routine healing: Secondary | ICD-10-CM

## 2015-02-11 DIAGNOSIS — M25422 Effusion, left elbow: Secondary | ICD-10-CM

## 2015-02-11 DIAGNOSIS — F17200 Nicotine dependence, unspecified, uncomplicated: Secondary | ICD-10-CM

## 2015-02-11 MED ORDER — IBUPROFEN 600 MG PO TABS: 600.0000 mg | ORAL_TABLET | Freq: Three times a day (TID) | ORAL | Status: DC | PRN

## 2015-02-11 NOTE — Patient Instructions (Signed)
It was a pleasure seeing you today, Bradley Sawyer.  1. High blood pressure - Continue current medications  2. Smoking cessation - Congratulations on quitting! Keep up the great work.  3. Back Pain - Continue percocet as advised by the pain clinic  4. Left elbow effusion - Try ibuprofen 600 mg three times a day  5. Acid reflux symptoms - Keep taking protonix 40 mg daily. Make sure to take in morning with breakfast.  6. Low blood count - Checking level today  General Instructions:   Thank you for bringing your medicines today. This helps [place] keep you safe from mistakes.   Progress Toward Treatment Goals:  Treatment Goal 11/07/2014  Blood pressure at goal  Stop smoking -    Self Care Goals & Plans:  Self Care Goal 12/23/2014  Manage my medications take my medicines as prescribed; bring my medications to every visit; refill my medications on time  Eat healthy foods eat more vegetables; eat foods that are low in salt; eat baked foods instead of fried foods  Be physically active -  Stop smoking -  Meeting treatment goals -    No flowsheet data found.   Care Management & Community Referrals:  Referral 11/07/2014  Referrals made for care management support none needed  Referrals made to community resources none

## 2015-02-11 NOTE — Progress Notes (Signed)
   Subjective:    Patient ID: Bradley Sawyer, male    DOB: July 23, 1956, 59 y.o.   MRN: [id number]  [name] is a 59yo man with PMHx of HTN, CAD, PAD, CVA, and recent innominate artery endarterectomy with partial sternotomy in Nov 2015 who presents today for the following:  1. HTN: BP 158/98 on left arm. Patient left office before BP could be repeated in both arms. He is taking Amlodipine 10 mg daily and Lisinopril-HCTZ 20-12.5 mg daily at home.   2. Innominate artery stenosis s/p endarterectomy: Patient denies any chest pain, dizziness, changes in vision, weakness, tingling/numbness in his extremities. Plavix was stopped in Dec 2015. He takes aspirin 81 mg daily.   3. Chronic Back Pain and Groin Pain: Patient reports he received a steroid injection last Tuesday (02/04/15) which has significantly improved his back and bilateral groin pain. He denies testicular pain. He also notes that he received "laser burning" in his groin but this did not help his pain at all. He reports Percocet 7.5-325 mg Q8H controls his pain. He is seen at a pain clinic where he receives his Percocet.   4. Left Olecranon Effusion: Patient is still bothered by his left elbow effusion. He states he has mild pain if he bumps his elbow on things. He reports at his last visit he was told to wear a brace over his elbow which has not helped with the swelling. He denies any trauma to the area. Denies history of gout. Denies fever, chills, redness. He states the appearance of the effusion bothers him most.   5. GERD: Patient reports symptoms occasionally, especially after eating fatty or greasy foods. He states he takes his Protonix pill in the morning with breakfast.   6. Normocytic Anemia: Last Hbg 8.7 on 10/20/14. Hbg has steadily trended down from 14.2 in August 2015. Denies symptoms of dizziness, orthostatic hypotension, fatigue.   7. Tobacco abuse: Patient reports he stopped smoking approximately 7 months ago. However, to  compensate he has been eating more volume of food.   Review of Systems General: Denies night sweats, changes in weight HEENT: Denies headaches, ear pain, rhinorrhea, sore throat CV: Denies palpitations, SOB, orthopnea Pulm: Denies SOB, cough, wheezing GI: Denies abdominal pain, nausea, vomiting, diarrhea, constipation, melena, hematochezia GU: Denies dysuria, hematuria, frequency Msk: Denies muscle cramps, joint pains Neuro: Denies weakness, numbness, tingling Skin: Denies rashes, bruising    Objective:   Physical Exam General: alert, sitting up in chair, NAD HEENT: [place]/AT, EOMI, sclera anicteric, mucus membranes moist Neck: supple, no JVD, no carotid bruits CV: RRR, no m/g/r Pulm: CTA bilaterally, breaths non-labored Abd: BS+, soft, obese, non-tender Ext: no peripheral edema, distal pulses 2+. Left olecranon has large effusion, no redness or tenderness to palpation.   Neuro: alert and oriented x 3, no focal deficits, strength intact       Assessment & Plan:

## 2015-02-12 DIAGNOSIS — D649 Anemia, unspecified: Secondary | ICD-10-CM | POA: Insufficient documentation

## 2015-02-12 LAB — CBC
HCT: 41.4 % (ref 39.0–52.0)
Hemoglobin: 13.6 g/dL (ref 13.0–17.0)
MCH: 28.9 pg (ref 26.0–34.0)
MCHC: 32.9 g/dL (ref 30.0–36.0)
MCV: 88.1 fL (ref 78.0–100.0)
MPV: 10.9 fL (ref 8.6–12.4)
PLATELETS: 242 10*3/uL (ref 150–400)
RBC: 4.7 MIL/uL (ref 4.22–5.81)
RDW: 14.9 % (ref 11.5–15.5)
WBC: 10.9 10*3/uL — ABNORMAL HIGH (ref 4.0–10.5)

## 2015-02-12 LAB — BASIC METABOLIC PANEL WITH GFR
BUN: 13 mg/dL (ref 6–23)
CHLORIDE: 100 meq/L (ref 96–112)
CO2: 30 mEq/L (ref 19–32)
Calcium: 9.3 mg/dL (ref 8.4–10.5)
Creat: 0.82 mg/dL (ref 0.50–1.35)
GFR, Est African American: >89 mL/min
Glucose, Bld: 118 mg/dL — ABNORMAL HIGH (ref 70–99)
POTASSIUM: 3.4 meq/L — AB (ref 3.5–5.3)
Sodium: 137 mEq/L (ref 135–145)

## 2015-02-12 LAB — IRON AND TIBC
%SAT: 17 % — ABNORMAL LOW (ref 20–55)
Iron: 42 ug/dL (ref 42–165)
TIBC: 245 ug/dL (ref 215–435)
UIBC: 203 ug/dL (ref 125–400)

## 2015-02-12 LAB — LIPID PANEL
CHOLESTEROL: 179 mg/dL (ref 0–200)
HDL: 36 mg/dL — ABNORMAL LOW (ref >=40)
TRIGLYCERIDES: 402 mg/dL — AB (ref <150)
Total CHOL/HDL Ratio: 5 Ratio

## 2015-02-12 NOTE — Assessment & Plan Note (Signed)
BP Readings from Last 3 Encounters:  02/11/15 158/98  12/30/14 148/78  12/23/14 164/92    Lab Results  Component Value Date   NA 137 02/11/2015   K 3.4* 02/11/2015   CREATININE 0.82 02/11/2015    Assessment: Blood pressure control: mildly elevated Progress toward BP goal:  deteriorated  Plan: Medications:  continue current medications  BP slightly elevated. Patient left office before repeat measurements in both arms could be taken. Will continue Amlodipine 10 mg daily and Lisinopril-HCTZ 20-25 mg daily for now and continue to monitor. Check pressures in both arms at next visit.

## 2015-02-12 NOTE — Assessment & Plan Note (Signed)
Patient reports occasional acid reflux symptoms, especially with fatty or greasy foods. We discussed avoiding these foods to help symptoms. Patient states he takes his Protonix 40 mg in the morning with breakfast.  - Continue Protonix 40 mg daily. - May need to increase to 40 mg BID if symptoms do not improve

## 2015-02-12 NOTE — Assessment & Plan Note (Signed)
Last lipid panel on 12/22/13 showed Chol 121,Trigly 142, HDL 41, LDL 52. Patient has been taking Pravastatin 20 mg daily. Given patient's history of CAD, previous stroke, PAD, HTN, smoking history, and 10 year cardiac risk score of 14%, he should at least be on moderate intensity statin therapy. - Repeat lipid panel today>> results: Chol 179, Trigly 402, HDL 36, LDL not able to be calculated due to high triglycerides - Will order direct LDL - Discuss with patient about increasing Pravastatin to 40 mg daily  - Will discuss dietary changes to help with triglycerides with patient

## 2015-02-12 NOTE — Assessment & Plan Note (Signed)
I commended the patient for his continued smoking cessation and discussed tactics to avoid relapses. We discussed being careful in regards to his diet as weight gain/obesity will not help his other medical problems. We talked about trying to eat more fruits and vegetables and to avoid fast foods and sweets.

## 2015-02-12 NOTE — Assessment & Plan Note (Signed)
Hbg trending down to 8.7 three months ago. MCV 93.  - Check CBC >> Hbg back up to 13.6. Likely related to recent surgeries for his innominate artery stenosis

## 2015-02-12 NOTE — Assessment & Plan Note (Signed)
Patient reports his back and bilateral groin pain have improved significantly since having a steroid injection last week. He is seen at a pain clinic where he receives his Percocet 7.5-325 mg. He states his pain is well controlled with this medication.  - Continue with current regimen

## 2015-02-12 NOTE — Assessment & Plan Note (Addendum)
Patient states L olecranon effusion has not improved after wearing a brace for over a month. Likely is not gout as is not erythematous or tender to palpation. Seems most consistent with olecranon bursitis. Discussed with patient that if we were to drain the fluid it would likely reaccumulate.  - Will try ibuprofen 600 mg TID for 1 month - Patient to have follow up in 1 month to reassess

## 2015-02-13 LAB — LDL CHOLESTEROL, DIRECT: Direct LDL: 94 mg/dL

## 2015-02-13 NOTE — Progress Notes (Signed)
Internal Medicine Clinic Attending  Case discussed with Dr. Rivet at the time of the visit.  We reviewed the resident's history and exam and pertinent patient test results.  I agree with the assessment, diagnosis, and plan of care documented in the resident's note.  

## 2015-02-26 ENCOUNTER — Other Ambulatory Visit: Payer: Self-pay | Admitting: *Deleted

## 2015-02-26 DIAGNOSIS — S32020K Wedge compression fracture of second lumbar vertebra, subsequent encounter for fracture with nonunion: Secondary | ICD-10-CM

## 2015-03-03 MED ORDER — GABAPENTIN 400 MG PO CAPS
800.0000 mg | ORAL_CAPSULE | Freq: Three times a day (TID) | ORAL | Status: DC
Start: 2015-03-03 — End: 2015-09-04

## 2015-03-03 NOTE — Telephone Encounter (Signed)
Pt called and is out of meds.  No response from Dr Arcelia Jew, will you please refill?

## 2015-03-14 ENCOUNTER — Encounter: Payer: Self-pay | Admitting: Internal Medicine

## 2015-03-14 ENCOUNTER — Ambulatory Visit (INDEPENDENT_AMBULATORY_CARE_PROVIDER_SITE_OTHER): Payer: Medicaid Other | Admitting: Internal Medicine

## 2015-03-14 VITALS — BP 145/73 | HR 61 | Temp 98.3°F | Ht 71.0 in | Wt 234.0 lb

## 2015-03-14 DIAGNOSIS — I10 Essential (primary) hypertension: Secondary | ICD-10-CM

## 2015-03-14 DIAGNOSIS — M7022 Olecranon bursitis, left elbow: Secondary | ICD-10-CM

## 2015-03-14 DIAGNOSIS — E785 Hyperlipidemia, unspecified: Secondary | ICD-10-CM

## 2015-03-14 MED ORDER — ATORVASTATIN CALCIUM 10 MG PO TABS: 10.0000 mg | ORAL_TABLET | Freq: Every day | ORAL | Status: DC

## 2015-03-14 NOTE — Progress Notes (Signed)
   Subjective:    Patient ID: Bradley Sawyer, male    DOB: 1956/05/07, 59 y.o.   MRN: [id number]  [name] is a 59yo man with PMHx of HTN, CAD, PAD, CVA, and innominate artery stenosis s/p endarterectomy with partial sternotomy in Nov 2015 who presents today for the following:  HTN: Today, BP is 145/73 in right arm and 139/81 in left arm. This has improved since his last visit. He is taking Amlodipine 10 mg daily and Lisinopril-HCTZ 20-12.5 mg daily.  Left Olecranon Effusion: Patient with persistent effusion on his left elbow. He denies any pain, fever, chills, redness.  He has tried joint protection with a brace and 1 month of ibuprofen 200 mg TID without any resolution of the effusion. He is most bothered by its appearance.   HLD: Last lipid profile on 02/11/15 showed Chol 179, Trigly 402, HDL 36, and direct LDL 94. Patient is currently on Pravastatin 20 mg daily. He admits to a poor diet, eating high-fat foods such as grits with butter and potato chips. We discussed cutting down on these foods and incorporating more fruits and vegetables into his diet.    Review of Systems General: Denies fever, chills, night sweats, changes in weight, changes in appetite HEENT: Denies headaches, ear pain, changes in vision, rhinorrhea, sore throat CV: Denies CP, palpitations, SOB, orthopnea Pulm: Denies SOB, cough, wheezing GI: Denies abdominal pain, nausea, vomiting, diarrhea, constipation, melena, hematochezia GU: Denies dysuria, hematuria, frequency Msk: Denies muscle cramps, joint pains Neuro: Denies weakness, numbness, tingling Skin: Denies rashes, bruising    Objective:   Physical Exam General: sitting up in chair, NAD HEENT: [place]/AT, EOMI, mucus membranes moist CV: RRR, no m/g/r Pulm: CTA bilaterally, breaths non-labored Abd: BS+, soft, obese, non-tender Ext: no edema. Left olecranon with a large effusion present, no tenderness to palpation or erythema present. Neuro: alert and  oriented x 3, no focal deficits     Assessment & Plan:  Please refer to A&P documentation.

## 2015-03-14 NOTE — Assessment & Plan Note (Signed)
Patient's left olecranon effusion has not improved despite joint support and NSAIDs (ibuprofen 200 mg TID for 1 month). No signs or symptoms of infection. I explained to patient that we could drain the area, but that there is a high possibility that the effusion will come right back. He understands and wishes to proceed with draining the effusion.  - Schedule procedure for next week with supervising attending - Instructed to stop ibuprofen since not helping

## 2015-03-14 NOTE — Progress Notes (Signed)
Internal Medicine Clinic Attending  Case discussed with Dr. Rivet at the time of the visit.  We reviewed the resident's history and exam and pertinent patient test results.  I agree with the assessment, diagnosis, and plan of care documented in the resident's note.  

## 2015-03-14 NOTE — Patient Instructions (Signed)
It was a pleasure seeing you today, Bradley Sawyer.   - Your cholesterol medication has been changed to Lipitor 10 mg daily. You can finish your current Pravastatin bottle and then switch to Lipitor.  - We will schedule an appointment to drain your fluid collection on your left elbow. You can stop taking the ibuprofen.   General Instructions:   Thank you for bringing your medicines today. This helps [place] keep you safe from mistakes.   Progress Toward Treatment Goals:  Treatment Goal 02/11/2015  Blood pressure deteriorated  Stop smoking -    Self Care Goals & Plans:  Self Care Goal 03/14/2015  Manage my medications take my medicines as prescribed; bring my medications to every visit; refill my medications on time  Eat healthy foods drink diet soda or water instead of juice or soda; eat more vegetables; eat foods that are low in salt; eat baked foods instead of fried foods; eat fruit for snacks and desserts  Be physically active -  Stop smoking -  Meeting treatment goals -    No flowsheet data found.   Care Management & Community Referrals:  Referral 11/07/2014  Referrals made for care management support none needed  Referrals made to community resources none

## 2015-03-14 NOTE — Assessment & Plan Note (Signed)
BP Readings from Last 3 Encounters:  03/14/15 145/73  02/11/15 158/98  12/30/14 148/78    Lab Results  Component Value Date   NA 137 02/11/2015   K 3.4* 02/11/2015   CREATININE 0.82 02/11/2015    Assessment: Blood pressure control: mildly elevated Progress toward BP goal:  improved Comments: BP much improved from last visit. RA 145/73, LA 139/81.   Plan: Medications:  continue current medications Educational resources provided: brochure (denies) Other plans:  - Continue Amlodipine 10 mg daily and Lisinopril-HCTZ 20-12.5 mg daily  - Consider adding beta blocker at next visit given his history of CAD, will also aide in decreasing BP slightly

## 2015-03-14 NOTE — Assessment & Plan Note (Signed)
His LDL is 94, but patient has multiple risk factors (HTN, HLD, previous CVA, PAD) and his ASCVD risk is 20%. I believe he would benefit from a higher potency statin.  - Switch to Lipitor 10 mg daily

## 2015-03-19 ENCOUNTER — Encounter: Payer: Self-pay | Admitting: Internal Medicine

## 2015-03-19 ENCOUNTER — Ambulatory Visit (INDEPENDENT_AMBULATORY_CARE_PROVIDER_SITE_OTHER): Payer: Medicaid Other | Admitting: Internal Medicine

## 2015-03-19 VITALS — BP 113/64 | HR 84 | Temp 100.5°F | Ht 71.0 in | Wt 229.5 lb

## 2015-03-19 DIAGNOSIS — J029 Acute pharyngitis, unspecified: Secondary | ICD-10-CM

## 2015-03-19 DIAGNOSIS — M7022 Olecranon bursitis, left elbow: Secondary | ICD-10-CM | POA: Diagnosis present

## 2015-03-19 DIAGNOSIS — J028 Acute pharyngitis due to other specified organisms: Secondary | ICD-10-CM

## 2015-03-19 DIAGNOSIS — B9789 Other viral agents as the cause of diseases classified elsewhere: Secondary | ICD-10-CM

## 2015-03-19 DIAGNOSIS — I10 Essential (primary) hypertension: Secondary | ICD-10-CM | POA: Diagnosis not present

## 2015-03-19 DIAGNOSIS — J069 Acute upper respiratory infection, unspecified: Secondary | ICD-10-CM

## 2015-03-19 MED ORDER — GUAIFENESIN ER 600 MG PO TB12: 600.0000 mg | ORAL_TABLET | Freq: Two times a day (BID) | ORAL | Status: AC

## 2015-03-19 NOTE — Patient Instructions (Signed)
It was a pleasure to see you, Bradley Sawyer.   1. Left Olecranon Effusion  - referral to general surgery  2. Upper respiratory symptoms - Start taking Mucinex twice a day to help with your cough - Drink plenty of fluids - Take Tylenol for fever  - if your symptoms do not get better in the next 5-7 days then please come back to clinic  General Instructions:   Please bring your medicines with you each time you come to clinic.  Medicines may include prescription medications, over-the-counter medications, herbal remedies, eye drops, vitamins, or other pills.   Progress Toward Treatment Goals:  Treatment Goal 03/14/2015  Blood pressure improved  Stop smoking -    Self Care Goals & Plans:  Self Care Goal 03/14/2015  Manage my medications take my medicines as prescribed; bring my medications to every visit; refill my medications on time  Eat healthy foods drink diet soda or water instead of juice or soda; eat more vegetables; eat foods that are low in salt; eat baked foods instead of fried foods; eat fruit for snacks and desserts; eat smaller portions  Be physically active find an activity I enjoy; take a walk every day  Stop smoking -  Meeting treatment goals -    No flowsheet data found.   Care Management & Community Referrals:  Referral 11/07/2014  Referrals made for care management support none needed  Referrals made to community resources none

## 2015-03-19 NOTE — Progress Notes (Signed)
   Subjective:    Patient ID: Bradley Sawyer, male    DOB: 10-13-1956, 59 y.o.   MRN: [id number]  [name] is a 59yo man with PMHx of HTN, CAD, PAD, CVA, and innominate artery stenosis s/p endarterectomy with partial sternotomy in Nov 2015 who presents today for the following:  Upper Respiratory Symptoms: Patient reports not feeling well starting yesterday. He notes subjective fever/chills last night and coughing up a lot of yellowish mucus. He also reports feeling fatigued and not able to get off the couch. He denies headaches, body aches, sore throat, rhinorrhea, sinus pain/pressure, ear pain, wheezing, dyspnea, pleuritic and chest pain.   Left Olecranon Bursitis: Patient here today for left olecranon bursa aspiration. Patient has had an effusion present on his left olecranon for several months now and has failed conservative treatments with joint support and ibuprofen 800 mg TID for 1 month. He denies any pain or redness.   HTN: BP today 113/64. Patient takes Amlodipine 10 mg daily and Lisinopril-HCTZ 20-12.5 mg daily.    Review of Systems General: Denies night sweats, changes in weight, changes in appetite HEENT: See above CV: Denies palpitations, orthopnea Pulm: See above  GI: Denies abdominal pain, nausea, vomiting, diarrhea, constipation, melena, hematochezia GU: Denies dysuria, hematuria, frequency Msk: Denies muscle cramps, joint pains Neuro: Denies weakness, numbness, tingling Skin: Denies rashes, bruising    Objective:   Physical Exam General: sitting up in chair, NAD HEENT: [place]/AT, EOMI. Ear canals appear normal with no erythema and tympanic membranes appear normal bilaterally. Pharynx mildly erythematous, no exudates. Tonsils appear normal. Mucus membranes moist.  Neck: supple, cervical lymphadenopathy present CV: RRR, no m/g/r Pulm: CTA bilaterally, breaths non-labored Abd: BS+, soft, obese, non-tender Ext: No lower extremity edema. Left olecranon with a large  effusion present, no tenderness to palpation, no erythema.  Neuro: alert and oriented x 3, no focal deficits   Procedure Note Procedure: Left Olecranon Bursa Aspiration  Patient was prepped and draped appropriately. The area was numbed with aerosol cold spray. An 18 gauge needle was inserted in the left olecranon bursa and no fluid was aspirated. This was attempted a second time in a different location a few centimeters medial to the previous injection site. No fluid was aspirated. There was minimal bleeding. Area was cleaned and a band aid placed.   Patient tolerated the procedure well with no complications.     Assessment & Plan:  Please refer to A&P documentation.

## 2015-03-20 DIAGNOSIS — B9789 Other viral agents as the cause of diseases classified elsewhere: Secondary | ICD-10-CM

## 2015-03-20 DIAGNOSIS — J029 Acute pharyngitis, unspecified: Secondary | ICD-10-CM | POA: Insufficient documentation

## 2015-03-20 DIAGNOSIS — J028 Acute pharyngitis due to other specified organisms: Secondary | ICD-10-CM

## 2015-03-20 NOTE — Assessment & Plan Note (Signed)
Patient's symptoms and physical exam findings of fever of 100.5, erythematous throat, and cervical lymphadenopathy seem most consistent with a viral pharyngitis. Recommended for patient to drink plenty of fluids and take Tylenol as needed for fever. Instructed to return to clinic if symptoms do not resolve in 5-7 days or if symptoms worsen.

## 2015-03-20 NOTE — Assessment & Plan Note (Signed)
BP Readings from Last 3 Encounters:  03/19/15 113/64  03/14/15 145/73  02/11/15 158/98    Lab Results  Component Value Date   NA 137 02/11/2015   K 3.4* 02/11/2015   CREATININE 0.82 02/11/2015    Assessment: Blood pressure control: controlled Progress toward BP goal:  at goal Comments: BP much lower than normal. Likely in setting of acute viral infection.   Plan: Medications:  Continue Amlodipine 10 mg daily and Lisinopril-HCTZ 20-12.5 mg daily  Other plans:  - Will consider beta blocker when not acutely ill

## 2015-03-20 NOTE — Assessment & Plan Note (Signed)
No fluid aspirated from the left olecranon bursa. Could possibly be a lipoma. No evidence of inflammation or infection to be concerning for gout or septic arthritis, especially given no fluid was aspirated. Will refer to general surgery for evaluation.  - Referral placed to general surgery

## 2015-03-23 NOTE — Progress Notes (Signed)
I saw and evaluated the patient.  I personally confirmed the key portions of Dr. Shela Commons history and exam and reviewed pertinent patient test results.  The assessment, diagnosis, and plan were formulated together and I agree with the documentation in the resident's note.  I was present at the bedside for the entire aspiration procedure.  No fluid was aspirated suggesting that the lesion may represent a lipoma.  Since it is bothersome to the patient he was referred to general surgery to consider excision of the lesion.

## 2015-03-29 ENCOUNTER — Other Ambulatory Visit: Payer: Self-pay | Admitting: Internal Medicine

## 2015-04-01 ENCOUNTER — Other Ambulatory Visit: Payer: Self-pay | Admitting: Internal Medicine

## 2015-04-01 DIAGNOSIS — M7022 Olecranon bursitis, left elbow: Secondary | ICD-10-CM

## 2015-04-02 ENCOUNTER — Other Ambulatory Visit: Payer: Self-pay | Admitting: *Deleted

## 2015-04-03 MED ORDER — FLUOXETINE HCL 10 MG PO CAPS: 10.0000 mg | ORAL_CAPSULE | Freq: Every day | ORAL | Status: DC

## 2015-04-29 ENCOUNTER — Other Ambulatory Visit: Payer: Self-pay | Admitting: Internal Medicine

## 2015-04-29 DIAGNOSIS — M7022 Olecranon bursitis, left elbow: Secondary | ICD-10-CM

## 2015-04-30 ENCOUNTER — Other Ambulatory Visit: Payer: Self-pay | Admitting: Internal Medicine

## 2015-05-06 ENCOUNTER — Other Ambulatory Visit: Payer: Self-pay | Admitting: Internal Medicine

## 2015-05-06 ENCOUNTER — Telehealth: Payer: Self-pay

## 2015-05-06 ENCOUNTER — Telehealth: Payer: Self-pay | Admitting: *Deleted

## 2015-05-06 DIAGNOSIS — I10 Essential (primary) hypertension: Secondary | ICD-10-CM

## 2015-05-06 MED ORDER — AMLODIPINE BESYLATE 10 MG PO TABS: 10.0000 mg | ORAL_TABLET | Freq: Every day | ORAL | Status: DC

## 2015-05-06 NOTE — Telephone Encounter (Signed)
Not sure why patient is so upset. We have always had pleasant interactions. I refilled his Amlodipine 10 mg daily with 90 day supply and refills.

## 2015-05-06 NOTE — Telephone Encounter (Signed)
Pt is angry, states he has had nothing but trouble since seeing dr rivet, he wants amlodipine refilled, it is not on the current med list, i see at the 4/27 visit you told him to continue taking it, could you please review the list and place an order [facility] it [facility] his [facility], 90 day supply w/ refills or clarify that he should not be taking it, he was very angry.

## 2015-05-06 NOTE — Telephone Encounter (Signed)
Phone call from pt.  Stated that the PA that discharged him from the hospital, in November 2015, ordered home Oxygen.  Stated at the last visit in January 2016, he was told to use it another month, and if he didn't need it, then would discontinue it.  Stated he hasn't used the oxygen since that office visit, and is requesting to have Dr. Oneida Alar notify the [place] to discontinue it.  Advised will discuss with Dr. Oneida Alar.

## 2015-05-07 ENCOUNTER — Encounter: Payer: Self-pay | Admitting: *Deleted

## 2015-05-07 ENCOUNTER — Telehealth: Payer: Self-pay | Admitting: *Deleted

## 2015-05-07 NOTE — Telephone Encounter (Signed)
[name] has been seeing Dr. Arcelia Jew at the [facility] since we saw him on 12-19-14. His last 4 O2 sats were:  97%, 96%, 97% and 95% was on 03-20-15 per [facility] at Dr. Shela Commons office. I called [facility] regarding the verbal order that was given to them in February to pick up this equipment. Per Shanon Brow at [facility], he checked their files and they did receive the verbal order but it was not processed for some unknown reason. I told him that the equipment is still at Bradley Sawyer's son's house at [facility]. Shanon Brow said that they needed a written order to pick up but would back date the billing to 12-23-14. I called Bradley Sawyer and relayed this information.

## 2015-05-28 ENCOUNTER — Other Ambulatory Visit: Payer: Self-pay | Admitting: Internal Medicine

## 2015-05-29 ENCOUNTER — Encounter: Payer: Self-pay | Admitting: Neurology

## 2015-05-29 ENCOUNTER — Ambulatory Visit (INDEPENDENT_AMBULATORY_CARE_PROVIDER_SITE_OTHER): Payer: Medicaid Other | Admitting: Neurology

## 2015-05-29 VITALS — BP 124/71 | HR 66 | Ht 71.0 in | Wt 220.0 lb

## 2015-05-29 DIAGNOSIS — I771 Stricture of artery: Secondary | ICD-10-CM

## 2015-05-29 DIAGNOSIS — I708 Atherosclerosis of other arteries: Secondary | ICD-10-CM | POA: Diagnosis not present

## 2015-05-29 NOTE — Patient Instructions (Signed)
I had a long d/w patient about his remote stroke, risk for recurrent stroke/TIAs, personally independently reviewed imaging studies and stroke evaluation results and answered questions.Continue aspirin 81 mg orally every day  for secondary stroke prevention and maintain strict control of hypertension with blood pressure goal below 130/90, diabetes with hemoglobin A1c goal below 6.5% and lipids with LDL cholesterol goal below 100 mg/dL. I also advised the patient to eat a healthy diet with plenty of whole grains, cereals, fruits and vegetables, exercise regularly and maintain ideal body weight. He was advised to keep his scheduled follow-up with a vascular surgeon Dr. Oneida Alar. No routine follow-up appointment is necessary with me in the future and continue follow-up with his primary physician.

## 2015-05-29 NOTE — Progress Notes (Signed)
[name] [address] [place]. [place] [address] [id number]       OFFICE FOLLOW-UP   Visit NOTE  [name] Date of Birth:  1956/09/21 Medical Record Number:  [id number]   HPI: 59 year male seen today for first office followup visit after hospital admission for stroke in January 2015. He underwent uncomplicated L2 corpectomy with L1-L3 fusion on 12/18/13. 2 days later he was noted to have some left-sided weakness. While in the waiting with physical therapy as he was noted to be leaning to the left side. He denied any complaints symptoms. CT scan was unremarkable. NIH stroke scale was 6. MRI scan showed scattered embolic strokes involving right frontal, parietal and occipital lobes. He had no known history of rectal fibrillation. Hemoglobin A1c was 5.9. Total cholesterol was 121, HDL 41 and LDL 52 mg percent. Urine drug screen was negative. MRI of the brain showed no large vessel stenosis. Transthoracic echo showed normal ejection fraction. Carotid Doppler showed mild 1-39% right ICA and 49% left ICA stenosis. EKG and telemetry monitoring revealed sinus rhythm. Patient was started on aspirin for stroke prevention and consult to quit tobacco. He had remote history of cocaine abuse but had quit in 1998. Patient was discharged home with home therapy and states has done well his left-sided weakness has normalized. He is no residual deficits. He still has some back pain which is his main limiting factor. He is back to his neurological baseline. He is here today for TCD bubble study. Update 12/07/2014 : He returns for follow-up after last visit 6 months ago. He continues to do well without recurrent stroke or TIA symptoms. 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 Oneida Alar and   Servando Snare on 10/15/2014 which went well. He has not had any recurrent stroke or TIA symptoms. He states his blood pressure and cholesterol control has been good. Update 05/29/2015 : He returns for follow-up after last visit 6 months ago. He continues to do well and has not had any recurrent stroke or TIA symptoms. He remains on aspirin which is tolerating well. He had cholesterol checked in April and it was unsatisfactory and he was started on Lipitor 10 mg which he seems to be tolerating well without myalgias or arthralgias but has not yet had a follow-up lipid profile checked. His blood pressure is good and today it is 124/71. He has an upcoming appointment to see vascular surgeon Dr. Oneida Alar and is likely to have follow-up carotid ultrasound repeated. ROS:   14 system review of systems is positive for shortness of breath, wheezing, back pain, restless legs, anxiety, nervousness and depression and all other systems negative  PMH:  Past Medical History  Diagnosis Date  . Hyperlipidemia   . Hypertension   . GERD (gastroesophageal reflux disease)   . Tobacco abuse   . COPD (chronic obstructive pulmonary disease)     bronchitis  . Disorder of vocal cord   . Cocaine dependence     Last use 1998, attends 3 NA meetings weekly.  . Right shoulder pain     2/2 partial rotator cuff tear, tendinopathy, mild subacromial/subdeltioid bursitis, A/C joint arthropathy per MRI done in 3/07  . Arthritis   . Anxiety   . Compression fracture     L2  . CVA (cerebral infarction)   . Fatty  liver   . Hx of cardiovascular stress test     ETT/Lexiscan-Myoview (10/15):  Inferior, inferoapical, apical lateral defect c/w mild ischemia, EF 66%; Intermediate Risk  . 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 Marital Status: Divorced    Spouse Name: N/A  . Number of Children: 1  . Years of Education: GED   Occupational History  . Disabled    Social  History Main Topics  . Smoking status: Former Smoker -- 1.00 packs/day for 48 years    Types: Cigarettes    Quit date: 09/23/2014  . Smokeless tobacco: Never Used     Comment: Stopped x 8 months.  . Alcohol Use: No  . Drug Use: No     Comment: no recent use  . Sexual Activity: Not on file   Other Topics Concern  . Not on file   Social History Narrative   Patient is single with one child.   Patient is right handed.   Patient has his GED.   Patient drinks 5 or more cups daily.    Medications:   Current Outpatient Prescriptions on File Prior to Visit  Medication Sig Dispense Refill  . amLODipine (NORVASC) 10 MG tablet Take 1 tablet (10 mg total) by mouth daily. 90 tablet 4  . aspirin EC 81 MG tablet Take 81 mg by mouth daily.    Marland Kitchen atorvastatin (LIPITOR) 10 MG tablet Take 1 tablet (10 mg total) by mouth daily. 30 tablet 11  . FLUoxetine (PROZAC) 10 MG capsule Take 1 capsule (10 mg total) by mouth daily. 90 capsule 3  . Fluticasone-Salmeterol (ADVAIR) 500-50 MCG/DOSE AEPB Inhale 1 puff into the lungs every 12 (twelve) hours. 180 each 4  . gabapentin (NEURONTIN) 400 MG capsule Take 2 capsules (800 mg total) by mouth 3 (three) times daily. 180 capsule 3  . guaiFENesin (MUCINEX) 600 MG 12 hr tablet Take 1 tablet (600 mg total) by mouth 2 (two) times daily. 60 tablet 2  . lisinopril-hydrochlorothiazide (PRINZIDE) 20-12.5 MG per tablet Take 1 tablet by mouth daily. 30 tablet 6  . meloxicam (MOBIC) 15 MG tablet TAKE 1 TABLET BY MOUTH EVERY DAY 30 tablet 3  . omega-3 acid ethyl esters (LOVAZA) 1 G capsule TAKE 2 CAPSULES (2 G TOTAL) BY MOUTH 2 (TWO) TIMES DAILY. 90 capsule 3  . oxyCODONE-acetaminophen (PERCOCET) 7.5-325 MG per tablet Take 1 tablet by mouth every 8 (eight) hours as needed for pain. 40 tablet 0  . pantoprazole (PROTONIX) 40 MG tablet Take 1 tablet (40 mg total) by mouth daily. 30 tablet 6  . polyethylene glycol (MIRALAX / GLYCOLAX) packet Take 17 g by mouth daily as needed (for  constipation).    Marland Kitchen PROAIR HFA 108 (90 BASE) MCG/ACT inhaler INHALE 2 PUFFS INTO THE LUNGS EVERY 6 (SIX) HOURS AS NEEDED FOR WHEEZING. 22.5 Inhaler 1  . rOPINIRole (REQUIP) 0.5 MG tablet Take 0.5 mg by mouth at bedtime.     No current facility-administered medications on file prior to visit.    Allergies:  No Known Allergies  Physical Exam General: well developed, well nourished obese caucasian male, seated, in no evident distress Head: head normocephalic and atraumatic. Orohparynx benign Neck: supple with no carotid or supraclavicular bruits. Healed right lower neck and midline sternotomy scar Cardiovascular: regular rate and rhythm, no murmurs Musculoskeletal: no deformity Skin:  no rash/petichiae. Left palm nodular lesion keloid versus lipoma Vascular:  Normal pulses all extremities Filed  Vitals:   05/29/15 1314  BP: 124/71  Pulse: 66   Neurologic Exam Mental Status: Awake and fully alert. Oriented to place and time. Recent and remote memory intact. Attention span, concentration and fund of knowledge appropriate. Mood and affect appropriate.  Cranial Nerves: Fundoscopic exam not done Pupils equal, briskly reactive to light. Extraocular movements full without nystagmus. Visual fields full to confrontation. Hearing intact. Facial sensation intact. Face, tongue, palate moves normally and symmetrically.  Motor: Normal bulk and tone. Normal strength in all tested extremity muscles. Sensory.: intact to touch and pinprick and vibratory sensation.  Coordination: Rapid alternating movements normal in all extremities. Finger-to-nose and heel-to-shin performed accurately bilaterally. Gait and Station: Arises from chair without difficulty. Stance is normal. Gait demonstrates normal stride length and balance . Able to heel, toe and tandem walk without difficulty.  Reflexes: 1+ and symmetric. Toes downgoing.      ASSESSMENT: 65 year Caucasian male with embolic right hemispheric infarcts  without identified source in January 2015 2 days  after back surgery. Interestingly he was found to have critical right subclavian and brachiocephalic stenosis later in November 2015 which perhaps may have been the etiology of his stroke Vascular risk factor of hyperlipidemia and mild obesity only. Status post  right subclavian and brachiocephalic endarterectomy with patch angioplasty in 2015    PLAN:  I had a long d/w patient about his remote stroke, risk for recurrent stroke/TIAs, personally independently reviewed imaging studies and stroke evaluation results and answered questions.Continue aspirin 81 mg orally every day  for secondary stroke prevention and maintain strict control of hypertension with blood pressure goal below 130/90, diabetes with hemoglobin A1c goal below 6.5% and lipids with LDL cholesterol goal below 100 mg/dL. I also advised the patient to eat a healthy diet with plenty of whole grains, cereals, fruits and vegetables, exercise regularly and maintain ideal body weight. He was advised to keep his scheduled follow-up with a vascular surgeon Dr. Oneida Alar. No routine follow-up appointment is necessary with me in the future and continue follow-up with his primary physician.   Antony Contras, MD          I

## 2015-06-16 ENCOUNTER — Encounter: Payer: Self-pay | Admitting: Family

## 2015-06-19 ENCOUNTER — Encounter: Payer: Self-pay | Admitting: Family

## 2015-06-19 ENCOUNTER — Ambulatory Visit (INDEPENDENT_AMBULATORY_CARE_PROVIDER_SITE_OTHER): Payer: Medicaid Other | Admitting: Family

## 2015-06-19 ENCOUNTER — Encounter (HOSPITAL_COMMUNITY): Payer: Medicaid Other

## 2015-06-19 VITALS — BP 126/71 | HR 60 | Temp 97.1°F | Resp 18 | Ht 71.0 in | Wt 222.7 lb

## 2015-06-19 DIAGNOSIS — Z9889 Other specified postprocedural states: Secondary | ICD-10-CM

## 2015-06-19 DIAGNOSIS — I771 Stricture of artery: Secondary | ICD-10-CM | POA: Diagnosis not present

## 2015-06-19 DIAGNOSIS — Z48812 Encounter for surgical aftercare following surgery on the circulatory system: Secondary | ICD-10-CM

## 2015-06-19 DIAGNOSIS — Z87891 Personal history of nicotine dependence: Secondary | ICD-10-CM | POA: Diagnosis not present

## 2015-06-19 DIAGNOSIS — Z4889 Encounter for other specified surgical aftercare: Secondary | ICD-10-CM | POA: Diagnosis not present

## 2015-06-19 NOTE — Progress Notes (Signed)
Established Innominate Artery stenosis/endarterectomy Patient   History of Present Illness  Bradley Sawyer is a 59 y.o. male patient of Dr. Oneida Alar who is s/p partial sternotomy for exposure of innominate artery and common carotid artery and subclavian artery with endarterectomy of innominate artery, subclavian artery, and common carotid artery with patch angioplasty. Plating of sternum on 10/15/14 by Dr. Oneida Alar and Dr. Servando Snare.  He returns today for scheduled follow up but apparently his insurance denied coverage of the carotid Duplex. Pt states a letter he received from Medicaid states this is because he has not been seen by our service within 60 days.    Most likely his TIA he had approximately January 2015 was related to the innominate artery stenosis. Pt states he has had no other TIA's nor strokes.  Pt states he has known lumbar spine issues that causes left radiculopathy type pain. He is in treatment by a pain management clinic.   He reports a tractor rolling over him in August 2014.  The patient denies New Medical or Surgical History.  Pt Diabetic: no Pt smoker: former smoker, quit November 2015  Pt meds include: Statin : yes ASA: yes Other anticoagulants/antiplatelets: no   Past Medical History  Diagnosis Date  . Hyperlipidemia   . Hypertension   . GERD (gastroesophageal reflux disease)   . Tobacco abuse   . COPD (chronic obstructive pulmonary disease)     bronchitis  . Disorder of vocal cord   . Cocaine dependence     Last use 1998, attends 3 NA meetings weekly.  . Right shoulder pain     2/2 partial rotator cuff tear, tendinopathy, mild subacromial/subdeltioid bursitis, A/C joint arthropathy per MRI done in 3/07  . Arthritis   . Anxiety   . Compression fracture     L2  . CVA (cerebral infarction)   . Fatty liver   . Hx of cardiovascular stress test     ETT/Lexiscan-Myoview (10/15):  Inferior, inferoapical, apical lateral defect c/w mild ischemia, EF 66%;  Intermediate Risk  . 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 Smoking status: Former Smoker -- 1.00 packs/day for 48 years    Types: Cigarettes    Quit date: 09/23/2014  . Smokeless tobacco: Never Used     Comment: Stopped x 8 months.  . Alcohol Use: No    Family History Family History  Problem Relation Age of Onset  . Diabetes Paternal Grandmother     1 st degree relatives  . Heart disease Father   . Diabetes Brother   . Heart disease Brother   . Muscular dystrophy Brother   . Muscular dystrophy Sister   . Heart attack Father   . Hypertension Paternal Grandmother     Surgical History Past Surgical History  Procedure Laterality Date  . Neck surgery    . Lumbar spine surgery  12/18/2013    L2     DR POOL  . Anterior lat lumbar fusion N/A 12/18/2013    Procedure: Lumbar Two Anteriorlateral Corpectomy w/ cage, interbody fusion, anterior plating, Lumbar One to Lumbar Three percutaneous pedicle screws;  Surgeon: Charlie Pitter, MD;  Location: [place] [facility];  Service: Neurosurgery;  Laterality: N/A;  . Tee without cardioversion N/A 12/25/2013    Procedure: TRANSESOPHAGEAL ECHOCARDIOGRAM (TEE);  Surgeon: Larey Dresser, MD;  Location: [facility] ENDOSCOPY;  Service: Cardiovascular;  Laterality: N/A;  . Arch aortogram, left carotid,left subclavian angiogram  08-23-2014  .  Aorta -innomiate bypass N/A 10/15/2014    Procedure: ENDARTERCTOMY OF  INNOMINATE ARTERY ;  Surgeon: Elam Dutch, MD;  Location: [place];  Service: Vascular;  Laterality: N/A;  . Patch angioplasty Right 10/15/2014    Procedure: PATCH ANGIOPLASTY OF RIGHT SUBCLAVIAN ARTERY , RIGHT COMMON CAROTID ARTERY & INNOMINATE ARTERY;  Surgeon: Elam Dutch, MD;  Location: [place];  Service: Vascular;  Laterality: Right;  . Endarterectomy Right 10/15/2014    Procedure: ENDARTERECTOMY RIGHT SUBCLAVIAN ARTERY;  Surgeon: Elam Dutch, MD;  Location: [place];  Service:  Vascular;  Laterality: Right;  . Endarterectomy Right 10/15/2014    Procedure: ENDARTERECTOMY RIGHT COMMON CAROTID ARTERY;  Surgeon: Elam Dutch, MD;  Location: [place];  Service: Vascular;  Laterality: Right;  . Sternotomy N/A 10/15/2014    Procedure: PARTIAL STERNOTOMY & PLATING OF STERNUM;  Surgeon: Elam Dutch, MD;  Location: [place];  Service: Vascular;  Laterality: N/A;  . Arch aortogram N/A 08/23/2014    Procedure: ARCH AORTOGRAM;  Surgeon: Elam Dutch, MD;  Location: [facility] CATH LAB;  Service: Cardiovascular;  Laterality: N/A;  . Carotid angiogram Left 08/23/2014    Procedure: CAROTID ANGIOGRAM;  Surgeon: Elam Dutch, MD;  Location[facility] C[facility] LAB;  Service: Cardiovascular;  Laterality: Left;  . Left heart catheterization with coronary angiogram N/A 09/12/2014    Procedure: LEFT HEART CATHETERIZATION WITH CORONARY ANGIOGRAM;  Surgeon: Josue Hector, MD;  Location: [facility] CATH LAB;  Service: Cardiovascular;  Laterality: N/A;    No Known Allergies  Current Outpatient Prescriptions  Medication Sig Dispense Refill  . amLODipine (NORVASC) 10 MG tablet Take 1 tablet (10 mg total) by mouth daily. 90 tablet 4  . aspirin EC 81 MG tablet Take 81 mg by mouth daily.    Marland Kitchen atorvastatin (LIPITOR) 10 MG tablet Take 1 tablet (10 mg total) by mouth daily. 30 tablet 11  . FLUoxetine (PROZAC) 10 MG capsule Take 1 capsule (10 mg total) by mouth daily. 90 capsule 3  . Fluticasone-Salmeterol (ADVAIR) 500-50 MCG/DOSE AEPB Inhale 1 puff into the lungs every 12 (twelve) hours. 180 each 4  . gabapentin (NEURONTIN) 400 MG capsule Take 2 capsules (800 mg total) by mouth 3 (three) times daily. 180 capsule 3  . guaiFENesin (MUCINEX) 600 MG 12 hr tablet Take 1 tablet (600 mg total) by mouth 2 (two) times daily. 60 tablet 2  . lisinopril-hydrochlorothiazide (PRINZIDE) 20-12.5 MG per tablet Take 1 tablet by mouth daily. 30 tablet 6  . meloxicam (MOBIC) 15 MG tablet TAKE 1 TABLET BY MOUTH EVERY DAY 30 tablet 3  .  omega-3 acid ethyl esters (LOVAZA) 1 G capsule TAKE 2 CAPSULES (2 G TOTAL) BY MOUTH 2 (TWO) TIMES DAILY. 90 capsule 3  . oxyCODONE-acetaminophen (PERCOCET) 7.5-325 MG per tablet Take 1 tablet by mouth every 8 (eight) hours as needed for pain. 40 tablet 0  . OXYCONTIN 10 MG T12A 12 hr tablet TAKE 1 TABLET BY ORAL ROUTE EVERY 12 HOURS  0  . pantoprazole (PROTONIX) 40 MG tablet Take 1 tablet (40 mg total) by mouth daily. 30 tablet 6  . polyethylene glycol (MIRALAX / GLYCOLAX) packet Take 17 g by mouth daily as needed (for constipation).    Marland Kitchen PROAIR HFA 108 (90 BASE) MCG/ACT inhaler INHALE 2 PUFFS INTO THE LUNGS EVERY 6 (SIX) HOURS AS NEEDED FOR WHEEZING. 22.5 Inhaler 1  . rOPINIRole (REQUIP) 0.5 MG tablet Take 0.5 mg by mouth at bedtime.     No current facility-administered  medications for this visit.    Review of Systems : See HPI for pertinent positives and negatives.  Physical Examination  Filed Vitals:   06/19/15 1140 06/19/15 1142  BP: 116/64 126/71  Pulse: 59 60  Temp: 97.1 F (36.2 C)   Resp: 18   Height: 5\' 11"  (1.803 m)   Weight: 222 lb 11.2 oz (101.016 kg)   SpO2: 96%    Body mass index is 31.07 kg/(m^2).  General: WDWN male in NAD GAIT: normal, stiff Eyes: PERRLA Pulmonary:  Non-labored, CTAB, Negative  Rales, Negative rhonchi, & Negative wheezing. Occasional moist cough.  Cardiac: regular rhythm,  no detected murmur.  VASCULAR EXAM Carotid Bruits Right Left   Positive, soft Negative    Aorta is not palpable. Radial pulses are 2+ palpable and equal.                                                                                                                            LE Pulses Right Left       FEMORAL   palpable   palpable        POPLITEAL  not palpable   not palpable       POSTERIOR TIBIAL   palpable    palpable        DORSALIS PEDIS      ANTERIOR TIBIAL  palpable   palpable     Gastrointestinal: soft, nontender, BS WNL, no r/g,  no palpable  masses.  Musculoskeletal: Negative muscle atrophy/wasting. M/S 5/5 throughout, extremities without ischemic changes.  Neurologic: A&O X 3; Appropriate Affect, Speech is normal CN 2-12 intact, pain and light touch intact in extremities, Motor exam as listed above.   Assessment: Bradley Sawyer is a 59 y.o. male who is s/p partial sternotomy for exposure of innominate artery and common carotid artery and subclavian artery with endarterectomy of innominate artery, subclavian artery, and common carotid artery with patch angioplasty. Plating of sternum on 10/15/14 by Dr. Oneida Alar and Dr. Servando Snare.  He returns today for scheduled follow up but apparently his insurance denied coverage of the carotid Duplex. Pt states a letter he received from Medicaid states this is because he has not been seen by our service within 60 days.    Most likely his TIA he had approximately January 2015 was related to the innominate artery stenosis. Pt states he has had no other TIA's nor strokes.  Plan: Follow-up in 6 weeks with Carotid Duplex if his insurance will cover scan and see me for discussion of results.   I discussed in depth with the patient the nature of atherosclerosis, and emphasized the importance of maximal medical management including strict control of blood pressure, blood glucose, and lipid levels, obtaining regular exercise, and continued cessation of smoking.  The patient is aware that without maximal medical management the underlying atherosclerotic disease process will progress, limiting the benefit of any interventions. The patient was given information about stroke prevention and what symptoms  should prompt the patient to seek immediate medical care. Thank you for allowing [place] to participate in this patient's care.  Clemon Chambers, RN, MSN, FNP-C [facility] Office: [phone]  Clinic Physician: Oneida Alar  06/19/2015 11:39 AM

## 2015-06-19 NOTE — Patient Instructions (Signed)
Stroke Prevention Some medical conditions and behaviors are associated with an increased chance of having a stroke. You may prevent a stroke by making healthy choices and managing medical conditions. HOW CAN I REDUCE MY RISK OF HAVING A STROKE?   Stay physically active. Get at least 30 minutes of activity on most or all days.  Do not smoke. It may also be helpful to avoid exposure to secondhand smoke.  Limit alcohol use. Moderate alcohol use is considered to be:  No more than 2 drinks per day for men.  No more than 1 drink per day for nonpregnant women.  Eat healthy foods. This involves:  Eating 5 or more servings of fruits and vegetables a day.  Making dietary changes that address high blood pressure (hypertension), high cholesterol, diabetes, or obesity.  Manage your cholesterol levels.  Making food choices that are high in fiber and low in saturated fat, trans fat, and cholesterol may control cholesterol levels.  Take any prescribed medicines to control cholesterol as directed by your health care provider.  Manage your diabetes.  Controlling your carbohydrate and sugar intake is recommended to manage diabetes.  Take any prescribed medicines to control diabetes as directed by your health care provider.  Control your hypertension.  Making food choices that are low in salt (sodium), saturated fat, trans fat, and cholesterol is recommended to manage hypertension.  Take any prescribed medicines to control hypertension as directed by your health care provider.  Maintain a healthy weight.  Reducing calorie intake and making food choices that are low in sodium, saturated fat, trans fat, and cholesterol are recommended to manage weight.  Stop drug abuse.  Avoid taking birth control pills.  Talk to your health care provider about the risks of taking birth control pills if you are over 35 years old, smoke, get migraines, or have ever had a blood clot.  Get evaluated for sleep  disorders (sleep apnea).  Talk to your health care provider about getting a sleep evaluation if you snore a lot or have excessive sleepiness.  Take medicines only as directed by your health care provider.  For some people, aspirin or blood thinners (anticoagulants) are helpful in reducing the risk of forming abnormal blood clots that can lead to stroke. If you have the irregular heart rhythm of atrial fibrillation, you should be on a blood thinner unless there is a good reason you cannot take them.  Understand all your medicine instructions.  Make sure that other conditions (such as anemia or atherosclerosis) are addressed. SEEK IMMEDIATE MEDICAL CARE IF:   You have sudden weakness or numbness of the face, arm, or leg, especially on one side of the body.  Your face or eyelid droops to one side.  You have sudden confusion.  You have trouble speaking (aphasia) or understanding.  You have sudden trouble seeing in one or both eyes.  You have sudden trouble walking.  You have dizziness.  You have a loss of balance or coordination.  You have a sudden, severe headache with no known cause.  You have new chest pain or an irregular heartbeat. Any of these symptoms may represent a serious problem that is an emergency. Do not wait to see if the symptoms will go away. Get medical help at once. Call your local emergency services (911 in [place] Do not drive yourself to the hospital. Document Released: 12/16/2004 Document Revised: 03/25/2014 Document Reviewed: 05/11/2013 Exit[employer] Patient Information 2015 [employer]. This information is not intended to replace advice given   to you by your health care provider. Make sure you discuss any questions you have with your health care provider.  

## 2015-07-04 ENCOUNTER — Other Ambulatory Visit: Payer: Self-pay | Admitting: Internal Medicine

## 2015-07-28 ENCOUNTER — Other Ambulatory Visit: Payer: Self-pay | Admitting: Internal Medicine

## 2015-07-30 ENCOUNTER — Telehealth: Payer: Self-pay | Admitting: *Deleted

## 2015-07-30 ENCOUNTER — Encounter: Payer: Self-pay | Admitting: Family

## 2015-07-30 NOTE — Telephone Encounter (Signed)
Received PA request from pt's pharmacy-pt exempt from PA because he has hx of triglycerides greater that 500.  PA approved, pharmacy aware.Despina Hidden Cassady9/7/20162:44 PM        [phone] [address]  PHARM[facility] [id number]  Avyn Maland 07/30/2015  APPROVED 07/30/2015 - 07/29/2016 DMA

## 2015-07-31 ENCOUNTER — Ambulatory Visit (INDEPENDENT_AMBULATORY_CARE_PROVIDER_SITE_OTHER): Payer: Medicaid Other | Admitting: Family

## 2015-07-31 ENCOUNTER — Encounter: Payer: Self-pay | Admitting: Family

## 2015-07-31 ENCOUNTER — Ambulatory Visit (HOSPITAL_COMMUNITY)
Admission: RE | Admit: 2015-07-31 | Discharge: 2015-07-31 | Disposition: A | Discharge status: Home / Self Care | Payer: Medicaid Other | Source: Ambulatory Visit | Attending: Family | Admitting: Family

## 2015-07-31 VITALS — BP 127/85 | HR 70 | Ht 71.0 in | Wt 221.6 lb

## 2015-07-31 DIAGNOSIS — Z48812 Encounter for surgical aftercare following surgery on the circulatory system: Secondary | ICD-10-CM

## 2015-07-31 DIAGNOSIS — Z4889 Encounter for other specified surgical aftercare: Secondary | ICD-10-CM

## 2015-07-31 DIAGNOSIS — Z87891 Personal history of nicotine dependence: Secondary | ICD-10-CM | POA: Diagnosis not present

## 2015-07-31 DIAGNOSIS — I779 Disorder of arteries and arterioles, unspecified: Secondary | ICD-10-CM | POA: Insufficient documentation

## 2015-07-31 DIAGNOSIS — Z9889 Other specified postprocedural states: Secondary | ICD-10-CM

## 2015-07-31 DIAGNOSIS — I771 Stricture of artery: Secondary | ICD-10-CM | POA: Diagnosis not present

## 2015-07-31 NOTE — Patient Instructions (Addendum)
Stroke Prevention Some medical conditions and behaviors are associated with an increased chance of having a stroke. You may prevent a stroke by making healthy choices and managing medical conditions. HOW CAN I REDUCE MY RISK OF HAVING A STROKE?   Stay physically active. Get at least 30 minutes of activity on most or all days.  Do not smoke. It may also be helpful to avoid exposure to secondhand smoke.  Limit alcohol use. Moderate alcohol use is considered to be:  No more than 2 drinks per day for men.  No more than 1 drink per day for nonpregnant women.  Eat healthy foods. This involves:  Eating 5 or more servings of fruits and vegetables a day.  Making dietary changes that address high blood pressure (hypertension), high cholesterol, diabetes, or obesity.  Manage your cholesterol levels.  Making food choices that are high in fiber and low in saturated fat, trans fat, and cholesterol may control cholesterol levels.  Take any prescribed medicines to control cholesterol as directed by your health care provider.  Manage your diabetes.  Controlling your carbohydrate and sugar intake is recommended to manage diabetes.  Take any prescribed medicines to control diabetes as directed by your health care provider.  Control your hypertension.  Making food choices that are low in salt (sodium), saturated fat, trans fat, and cholesterol is recommended to manage hypertension.  Take any prescribed medicines to control hypertension as directed by your health care provider.  Maintain a healthy weight.  Reducing calorie intake and making food choices that are low in sodium, saturated fat, trans fat, and cholesterol are recommended to manage weight.  Stop drug abuse.  Avoid taking birth control pills.  Talk to your health care provider about the risks of taking birth control pills if you are over 35 years old, smoke, get migraines, or have ever had a blood clot.  Get evaluated for sleep  disorders (sleep apnea).  Talk to your health care provider about getting a sleep evaluation if you snore a lot or have excessive sleepiness.  Take medicines only as directed by your health care provider.  For some people, aspirin or blood thinners (anticoagulants) are helpful in reducing the risk of forming abnormal blood clots that can lead to stroke. If you have the irregular heart rhythm of atrial fibrillation, you should be on a blood thinner unless there is a good reason you cannot take them.  Understand all your medicine instructions.  Make sure that other conditions (such as anemia or atherosclerosis) are addressed. SEEK IMMEDIATE MEDICAL CARE IF:   You have sudden weakness or numbness of the face, arm, or leg, especially on one side of the body.  Your face or eyelid droops to one side.  You have sudden confusion.  You have trouble speaking (aphasia) or understanding.  You have sudden trouble seeing in one or both eyes.  You have sudden trouble walking.  You have dizziness.  You have a loss of balance or coordination.  You have a sudden, severe headache with no known cause.  You have new chest pain or an irregular heartbeat. Any of these symptoms may represent a serious problem that is an emergency. Do not wait to see if the symptoms will go away. Get medical help at once. Call your local emergency services (911 in [place] Do not drive yourself to the hospital. Document Released: 12/16/2004 Document Revised: 03/25/2014 Document Reviewed: 05/11/2013 ExitCare Patient Information 2015 [employer]. This information is not intended to replace advice given   to you by your health care provider. Make sure you discuss any questions you have with your health care provider.   Smoking Cessation Quitting smoking is important to your health and has many advantages. However, it is not always easy to quit since nicotine is a very addictive drug. Oftentimes, people try 3 times or more  before being able to quit. This document explains the best ways for you to prepare to quit smoking. Quitting takes hard work and a lot of effort, but you can do it. ADVANTAGES OF QUITTING SMOKING  You will live longer, feel better, and live better.  Your body will feel the impact of quitting smoking almost immediately.  Within 20 minutes, blood pressure decreases. Your pulse returns to its normal level.  After 8 hours, carbon monoxide levels in the blood return to normal. Your oxygen level increases.  After 24 hours, the chance of having a heart attack starts to decrease. Your breath, hair, and body stop smelling like smoke.  After 48 hours, damaged nerve endings begin to recover. Your sense of taste and smell improve.  After 72 hours, the body is virtually free of nicotine. Your bronchial tubes relax and breathing becomes easier.  After 2 to 12 weeks, lungs can hold more air. Exercise becomes easier and circulation improves.  The risk of having a heart attack, stroke, cancer, or lung disease is greatly reduced.  After 1 year, the risk of coronary heart disease is cut in half.  After 5 years, the risk of stroke falls to the same as a nonsmoker.  After 10 years, the risk of lung cancer is cut in half and the risk of other cancers decreases significantly.  After 15 years, the risk of coronary heart disease drops, usually to the level of a nonsmoker.  If you are pregnant, quitting smoking will improve your chances of having a healthy baby.  The people you live with, especially any children, will be healthier.  You will have extra money to spend on things other than cigarettes. QUESTIONS TO THINK ABOUT BEFORE ATTEMPTING TO QUIT You may want to talk about your answers with your health care provider.  Why do you want to quit?  If you tried to quit in the past, what helped and what did not?  What will be the most difficult situations for you after you quit? How will you plan to  handle them?  Who can help you through the tough times? Your family? Friends? A health care provider?  What pleasures do you get from smoking? What ways can you still get pleasure if you quit? Here are some questions to ask your health care provider:  How can you help me to be successful at quitting?  What medicine do you think would be best for me and how should I take it?  What should I do if I need more help?  What is smoking withdrawal like? How can I get information on withdrawal? GET READY  Set a quit date.  Change your environment by getting rid of all cigarettes, ashtrays, matches, and lighters in your home, car, or work. Do not let people smoke in your home.  Review your past attempts to quit. Think about what worked and what did not. GET SUPPORT AND ENCOURAGEMENT You have a better chance of being successful if you have help. You can get support in many ways.  Tell your family, friends, and coworkers that you are going to quit and need their support. Ask them not   to smoke around you.  Get individual, group, or telephone counseling and support. Programs are available at local hospitals and health centers. Call your local health department for information about programs in your area.  Spiritual beliefs and practices may help some smokers quit.  Download a "quit meter" on your computer to keep track of quit statistics, such as how long you have gone without smoking, cigarettes not smoked, and money saved.  Get a self-help book about quitting smoking and staying off tobacco. LEARN NEW SKILLS AND BEHAVIORS  Distract yourself from urges to smoke. Talk to someone, go for a walk, or occupy your time with a task.  Change your normal routine. Take a different route to work. Drink tea instead of coffee. Eat breakfast in a different place.  Reduce your stress. Take a hot bath, exercise, or read a book.  Plan something enjoyable to do every day. Reward yourself for not  smoking.  Explore interactive web-based programs that specialize in helping you quit. GET MEDICINE AND USE IT CORRECTLY Medicines can help you stop smoking and decrease the urge to smoke. Combining medicine with the above behavioral methods and support can greatly increase your chances of successfully quitting smoking.  Nicotine replacement therapy helps deliver nicotine to your body without the negative effects and risks of smoking. Nicotine replacement therapy includes nicotine gum, lozenges, inhalers, nasal sprays, and skin patches. Some may be available over-the-counter and others require a prescription.  Antidepressant medicine helps people abstain from smoking, but how this works is unknown. This medicine is available by prescription.  Nicotinic receptor partial agonist medicine simulates the effect of nicotine in your brain. This medicine is available by prescription. Ask your health care provider for advice about which medicines to use and how to use them based on your health history. Your health care provider will tell you what side effects to look out for if you choose to be on a medicine or therapy. Carefully read the information on the package. Do not use any other product containing nicotine while using a nicotine replacement product.  RELAPSE OR DIFFICULT SITUATIONS Most relapses occur within the first 3 months after quitting. Do not be discouraged if you start smoking again. Remember, most people try several times before finally quitting. You may have symptoms of withdrawal because your body is used to nicotine. You may crave cigarettes, be irritable, feel very hungry, cough often, get headaches, or have difficulty concentrating. The withdrawal symptoms are only temporary. They are strongest when you first quit, but they will go away within 10-14 days. To reduce the chances of relapse, try to:  Avoid drinking alcohol. Drinking lowers your chances of successfully quitting.  Reduce the  amount of caffeine you consume. Once you quit smoking, the amount of caffeine in your body increases and can give you symptoms, such as a rapid heartbeat, sweating, and anxiety.  Avoid smokers because they can make you want to smoke.  Do not let weight gain distract you. Many smokers will gain weight when they quit, usually less than 10 pounds. Eat a healthy diet and stay active. You can always lose the weight gained after you quit.  Find ways to improve your mood other than smoking. FOR MORE INFORMATION  www.smokefree.gov  Document Released: 11/02/2001 Document Revised: 03/25/2014 Document Reviewed: 02/17/2012 ExitCare Patient Information 2015 [employer]. This information is not intended to replace advice given to you by your health care provider. Make sure you discuss any questions you have with your health care   provider.  

## 2015-07-31 NOTE — Progress Notes (Signed)
Established Carotid Patient   History of Present Illness  Bradley Sawyer is a 59 y.o. male patient of Dr. Oneida Alar who is s/p partial sternotomy for exposure of innominate artery and common carotid artery and subclavian artery with endarterectomy of innominate artery, subclavian artery, and common carotid artery with patch angioplasty. Plating of sternum on 10/15/14 by Dr. Oneida Alar and Dr. Servando Snare.  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 Pt does not recall any stroke or TIA symptoms. Pt states he has had no other TIA's nor strokes.  Pt states he has known lumbar spine issues that causes left radiculopathy type pain. He is in treatment by a pain management clinic.   He reports a tractor rolling over him in August 2014.  The patient denies New Medical or Surgical History.  Pt Diabetic: no Pt smoker: current smoker, quit November 2015, resumed September 2016, pt states that he plans on quitng today.  Pt meds include: Statin : yes ASA: yes Other anticoagulants/antiplatelets: no    Past Medical History  Diagnosis Date  . Hyperlipidemia   . Hypertension   . GERD (gastroesophageal reflux disease)   . Tobacco abuse   . COPD (chronic obstructive pulmonary disease)     bronchitis  . Disorder of vocal cord   . Cocaine dependence     Last use 1998, attends 3 NA meetings weekly.  . Right shoulder pain     2/2 partial rotator cuff tear, tendinopathy, mild subacromial/subdeltioid bursitis, A/C joint arthropathy per MRI done in 3/07  . Arthritis   . Anxiety   . Compression fracture     L2  . CVA (cerebral infarction)   . Fatty liver   . Hx of cardiovascular stress test     ETT/Lexiscan-Myoview (10/15):  Inferior, inferoapical, apical lateral defect c/w mild ischemia, EF 66%; Intermediate Risk  . 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 Smoking  status: Current Every Day Smoker -- 1.00 packs/day for 48 years    Types: Cigarettes    Last Attempt to Quit: 09/23/2014  . Smokeless tobacco: Never Used  . Alcohol Use: No    Family History Family History  Problem Relation Age of Onset  . Diabetes Paternal Grandmother     1 st degree relatives  . Heart disease Father   . Diabetes Brother   . Heart disease Brother   . Muscular dystrophy Brother   . Muscular dystrophy Sister   . Heart attack Father   . Hypertension Paternal Grandmother     Surgical History Past Surgical History  Procedure Laterality Date  . Neck surgery    . Lumbar spine surgery  12/18/2013    L2     DR POOL  . Anterior lat lumbar fusion N/A 12/18/2013    Procedure: Lumbar Two Anteriorlateral Corpectomy w/ cage, interbody fusion, anterior plating, Lumbar One to Lumbar Three percutaneous pedicle screws;  Surgeon: Charlie Pitter, MD;  Location: [place] [facility]UR[facility] ORS;  Service: Neurosurgery;  Laterality: N/A;  . Tee without cardioversion N/A 12/25/2013    Procedure: TRANSESOPHAGEAL ECHOCARDIOGRAM (TEE);  Surgeon: Larey Dresser, MD;  Location: [facility] ENDOSCOPY;  Service: Cardiovascular;  Laterality: N/A;  . Arch aortogram, left carotid,left subclavian angiogram  08-23-2014  . Aorta -innomiate bypass N/A 10/15/2014    Procedure: ENDARTERCTOMY OF  INNOMINATE ARTERY ;  Surgeon: Elam Dutch, MD;  Location: [place];  Service:  Vascular;  Laterality: N/A;  . Patch angioplasty Right 10/15/2014    Procedure: PATCH ANGIOPLASTY OF RIGHT SUBCLAVIAN ARTERY , RIGHT COMMON CAROTID ARTERY & INNOMINATE ARTERY;  Surgeon: Elam Dutch, MD;  Location: [place];  Service: Vascular;  Laterality: Right;  . Endarterectomy Right 10/15/2014    Procedure: ENDARTERECTOMY RIGHT SUBCLAVIAN ARTERY;  Surgeon: Elam Dutch, MD;  Location: [place];  Service: Vascular;  Laterality: Right;  . Endarterectomy Right 10/15/2014    Procedure: ENDARTERECTOMY RIGHT COMMON CAROTID ARTERY;  Surgeon: Elam Dutch,  MD;  Location: [place];  Service: Vascular;  Laterality: Right;  . Sternotomy N/A 10/15/2014    Procedure: PARTIAL STERNOTOMY & PLATING OF STERNUM;  Surgeon: Elam Dutch, MD;  Location: [place];  Service: Vascular;  Laterality: N/A;  . Arch aortogram N/A 08/23/2014    Procedure: ARCH AORTOGRAM;  Surgeon: Elam Dutch, MD;  Location: [facility] CATH LAB;  Service: Cardiovascular;  Laterality: N/A;  . Carotid angiogram Left 08/23/2014    Procedure: CAROTID ANGIOGRAM;  Surgeon: Elam Dutch, MD;  Location: [facility] CATH LAB;  Service: Cardiovascular;  Laterality: Left;  . Left heart catheterization with coronary angiogram N/A 09/12/2014    Procedure: LEFT HEART CATHETERIZATION WITH CORONARY ANGIOGRAM;  Surgeon: Josue Hector, MD;  Location: [facility] CATH LAB;  Service: Cardiovascular;  Laterality: N/A;    No Known Allergies  Current Outpatient Prescriptions  Medication Sig Dispense Refill  . amLODipine (NORVASC) 10 MG tablet Take 1 tablet (10 mg total) by mouth daily. 90 tablet 4  . aspirin EC 81 MG tablet Take 81 mg by mouth daily.    Marland Kitchen atorvastatin (LIPITOR) 10 MG tablet Take 1 tablet (10 mg total) by mouth daily. 30 tablet 11  . FLUoxetine (PROZAC) 10 MG capsule Take 1 capsule (10 mg total) by mouth daily. 90 capsule 3  . Fluticasone-Salmeterol (ADVAIR) 500-50 MCG/DOSE AEPB Inhale 1 puff into the lungs every 12 (twelve) hours. 180 each 4  . gabapentin (NEURONTIN) 400 MG capsule Take 2 capsules (800 mg total) by mouth 3 (three) times daily. 180 capsule 3  . lisinopril-hydrochlorothiazide (PRINZIDE,ZESTORETIC) 20-12.5 MG per tablet TAKE 1 TABLET BY MOUTH DAILY. 30 tablet 6  . meloxicam (MOBIC) 15 MG tablet TAKE 1 TABLET BY MOUTH EVERY DAY 30 tablet 3  . omega-3 acid ethyl esters (LOVAZA) 1 G capsule TAKE 2 CAPSULES (2 G TOTAL) BY MOUTH 2 (TWO) TIMES DAILY. 90 capsule 3  . oxyCODONE-acetaminophen (PERCOCET) 7.5-325 MG per tablet Take 1 tablet by mouth every 8 (eight) hours as needed for pain. 40 tablet 0   . OXYCONTIN 10 MG T12A 12 hr tablet TAKE 1 TABLET BY ORAL ROUTE EVERY 12 HOURS  0  . pantoprazole (PROTONIX) 40 MG tablet TAKE 1 TABLET (40 MG TOTAL) BY MOUTH DAILY. 30 tablet 6  . polyethylene glycol (MIRALAX / GLYCOLAX) packet Take 17 g by mouth daily as needed (for constipation).    Marland Kitchen PROAIR HFA 108 (90 BASE) MCG/ACT inhaler INHALE 2 PUFFS INTO THE LUNGS EVERY 6 (SIX) HOURS AS NEEDED FOR WHEEZING. 22.5 Inhaler 1  . rOPINIRole (REQUIP) 0.5 MG tablet Take 0.5 mg by mouth at bedtime.    Marland Kitchen guaiFENesin (MUCINEX) 600 MG 12 hr tablet Take 1 tablet (600 mg total) by mouth 2 (two) times daily. (Patient not taking: Reported on 06/19/2015) 60 tablet 2   No current facility-administered medications for this visit.    Review of Systems : See HPI for pertinent positives and negatives.  Physical Examination  Filed Vitals:   07/31/15 1507 07/31/15 1508  BP: 113/69 127/85  Pulse: 70   Height: 5\' 11"  (1.803 m)   Weight: 221 lb 9.6 oz (100.517 kg)   SpO2: 93%    Body mass index is 30.92 kg/(m^2).  General: WDWN obese male in NAD GAIT: normal, stiff Eyes: PERRLA Pulmonary: Non-labored, CTAB, Negative Rales, Negative rhonchi, & Negative wheezing. Occasional moist cough.  Cardiac: regular rhythm, no detected murmur.  VASCULAR EXAM Carotid Bruits Right Left   Positive, soft Negative   Aorta is not palpable. Radial pulses are 2+ palpable and equal.      LE Pulses Right Left   FEMORAL  palpable  palpable    POPLITEAL not palpable  not palpable   POSTERIOR TIBIAL  palpable   palpable    DORSALIS PEDIS  ANTERIOR TIBIAL palpable  palpable     Gastrointestinal: soft, nontender, BS WNL, no r/g, no palpable masses.  Musculoskeletal: Negative muscle atrophy/wasting. M/S 5/5 throughout, extremities without  ischemic changes.  Neurologic: A&O X 3; Appropriate Affect, Speech is normal CN 2-12 intact, pain and light touch intact in extremities, Motor exam as listed above.          Non-Invasive Vascular Imaging CAROTID DUPLEX 07/31/2015   CEREBROVASCULAR DUPLEX EVALUATION    INDICATION: Carotid disease    PREVIOUS INTERVENTION(S): Endarterectomy of innominate, right common and right subclavian arteries with angioplasty on 10/15/14    DUPLEX EXAM:     RIGHT  LEFT  Peak Systolic Velocities (cm/s) End Diastolic Velocities (cm/s) Plaque LOCATION Peak Systolic Velocities (cm/s) End Diastolic Velocities (cm/s) Plaque  128 20  CCA PROXIMAL 126 27   95 18  CCA MID 107 25   64 13  CCA DISTAL 74 20 HM  105 18  ECA 107 19   63 27 HT ICA PROXIMAL 55 19 HT  84 30  ICA MID 87 36   60 24  ICA DISTAL 84 34     0.98 ICA / CCA Ratio (PSV) 0.74  Antegrade Vertebral Flow Antegrade  956 Brachial Systolic Pressure (mmHg) 213  Triphasic (subclavian artery) Brachial Artery Waveforms Triphasic (subclavian artery)    Plaque Morphology:  HM = Homogeneous, HT = Heterogeneous, CP = Calcific Plaque, SP = Smooth Plaque, IP = Irregular Plaque     ADDITIONAL FINDINGS: Antegrade flow in the right vertebral flow with a maximum velocity of 151cm/s noted in the triphasic right proximal subclavian artery.  The innominate artery could not be adequately visualized due to vessel location.  No significant difference in the bilateral brachial pressures noted.    IMPRESSION: 1. Doppler velocities suggest 1-39% bilateral proximal internal carotid artery stenoses. 2. Patent right endarterectomy site noted, based on limited visualization, as described above.     Compared to the previous exam:  Significant improvement of the right subclavian and vertebral arteries as well as bilateral brachial pressures when compared to the previous exam on 07/25/14 with the bilateral internal carotid arteries remaining stable.        Assessment: JAVEION CANNEDY is a 59 y.o. male who is s/p partial sternotomy for exposure of innominate artery and common carotid artery and subclavian artery with endarterectomy of innominate artery, subclavian artery, and common carotid artery with patch angioplasty. Plating of sternum on 10/15/14 by Dr. Oneida Alar and Dr. Servando Snare.  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   Pt does not recall any stroke or TIA symptoms. Pt states  he has had no other TIA's nor strokes.  Today's carotid Duplex suggests  1-39% bilateral proximal internal carotid artery stenoses. Patent right endarterectomy site noted, based on limited visualization. Significant improvement of the right subclavian and vertebral arteries as well as bilateral brachial pressures when compared to the previous exam on 07/25/14 with the bilateral internal carotid arteries remaining stable.   His primary atherosclerotic risk factor remains smoking; he has tried to quit several times, see Plan.  Plan:  The patient was counseled re smoking cessation and given several free resources re smoking cessation.  Follow-up in 6 months with Carotid Duplex according to post operative surveillance timeline of the Society for Vascular [employer].   I discussed in depth with the patient the nature of atherosclerosis, and emphasized the importance of maximal medical management including strict control of blood pressure, blood glucose, and lipid levels, obtaining regular exercise, and cessation of smoking.  The patient is aware that without maximal medical management the underlying atherosclerotic disease process will progress, limiting the benefit of any interventions. The patient was given information about stroke prevention and what symptoms should prompt the patient to seek immediate medical care. Thank you for allowing [place] to participate in this patient's care.  Clemon Chambers, RN, MSN,  FNP-C [facility] Office: [facility] Physician: Oneida Alar  07/31/2015 3:30 PM

## 2015-08-01 NOTE — Addendum Note (Signed)
Addended by: Reola Calkins on: 08/01/2015 03:39 PM   Modules accepted: Orders

## 2015-09-04 ENCOUNTER — Other Ambulatory Visit: Payer: Self-pay | Admitting: Internal Medicine

## 2015-09-24 ENCOUNTER — Other Ambulatory Visit: Payer: Self-pay | Admitting: Internal Medicine

## 2015-09-24 ENCOUNTER — Telehealth: Payer: Self-pay | Admitting: *Deleted

## 2015-09-24 NOTE — Telephone Encounter (Signed)
Dr rivet please call triage [phone] this pm concerning pt's scripts and other providers Thanks!

## 2015-09-24 NOTE — Telephone Encounter (Signed)
After speaking w/ dr rivet triage notified pt and he states he has a new pcp and does not desire to be seen in clinic anymore

## 2015-09-24 NOTE — Telephone Encounter (Signed)
Patient needs clinic appointment

## 2015-09-28 ENCOUNTER — Emergency Department
Admission: EM | Admit: 2015-09-28 | Discharge: 2015-09-28 | Disposition: A | Discharge status: Home / Self Care | Payer: Medicaid Other | Attending: Emergency Medicine | Admitting: Emergency Medicine

## 2015-09-28 ENCOUNTER — Encounter: Payer: Self-pay | Admitting: Emergency Medicine

## 2015-09-28 DIAGNOSIS — Z720 Tobacco use: Secondary | ICD-10-CM | POA: Insufficient documentation

## 2015-09-28 DIAGNOSIS — R112 Nausea with vomiting, unspecified: Secondary | ICD-10-CM | POA: Insufficient documentation

## 2015-09-28 DIAGNOSIS — Z79899 Other long term (current) drug therapy: Secondary | ICD-10-CM | POA: Diagnosis not present

## 2015-09-28 DIAGNOSIS — I10 Essential (primary) hypertension: Secondary | ICD-10-CM | POA: Diagnosis not present

## 2015-09-28 DIAGNOSIS — R197 Diarrhea, unspecified: Secondary | ICD-10-CM | POA: Insufficient documentation

## 2015-09-28 DIAGNOSIS — Z791 Long term (current) use of non-steroidal anti-inflammatories (NSAID): Secondary | ICD-10-CM | POA: Diagnosis not present

## 2015-09-28 DIAGNOSIS — Z7982 Long term (current) use of aspirin: Secondary | ICD-10-CM | POA: Diagnosis not present

## 2015-09-28 LAB — COMPREHENSIVE METABOLIC PANEL
ALBUMIN: 4.3 g/dL (ref 3.5–5.0)
ALK PHOS: 86 U/L (ref 38–126)
ALT: 32 U/L (ref 17–63)
ANION GAP: 7 (ref 5–15)
AST: 29 U/L (ref 15–41)
BILIRUBIN TOTAL: 0.8 mg/dL (ref 0.3–1.2)
BUN: 25 mg/dL — ABNORMAL HIGH (ref 6–20)
CALCIUM: 9.2 mg/dL (ref 8.9–10.3)
CO2: 24 mmol/L (ref 22–32)
Chloride: 106 mmol/L (ref 101–111)
Creatinine, Ser: 0.93 mg/dL (ref 0.61–1.24)
GFR calc non Af Amer: >60 mL/min (ref >60)
GLUCOSE: 110 mg/dL — AB (ref 65–99)
Potassium: 3.3 mmol/L — ABNORMAL LOW (ref 3.5–5.1)
Sodium: 137 mmol/L (ref 135–145)
TOTAL PROTEIN: 8.4 g/dL — AB (ref 6.5–8.1)

## 2015-09-28 LAB — CBC WITH DIFFERENTIAL/PLATELET
BASOS ABS: 0.1 10*3/uL (ref 0–0.1)
BASOS PCT: 1 %
EOS ABS: 0 10*3/uL (ref 0–0.7)
Eosinophils Relative: 0 %
HCT: 45.7 % (ref 40.0–52.0)
HEMOGLOBIN: 15.4 g/dL (ref 13.0–18.0)
Lymphocytes Relative: 15 %
Lymphs Abs: 2.4 10*3/uL (ref 1.0–3.6)
MCH: 30 pg (ref 26.0–34.0)
MCHC: 33.7 g/dL (ref 32.0–36.0)
MCV: 89.1 fL (ref 80.0–100.0)
MONOS PCT: 9 %
Monocytes Absolute: 1.5 10*3/uL — ABNORMAL HIGH (ref 0.2–1.0)
NEUTROS PCT: 75 %
Neutro Abs: 12.7 10*3/uL — ABNORMAL HIGH (ref 1.4–6.5)
Platelets: 207 10*3/uL (ref 150–440)
RBC: 5.13 MIL/uL (ref 4.40–5.90)
RDW: 12.9 % (ref 11.5–14.5)
WBC: 16.8 10*3/uL — ABNORMAL HIGH (ref 3.8–10.6)

## 2015-09-28 LAB — TROPONIN I: TROPONIN I: 0.03 ng/mL (ref <0.031)

## 2015-09-28 MED ORDER — SODIUM CHLORIDE 0.9 % IV BOLUS (SEPSIS)
1000.0000 mL | Freq: Once | INTRAVENOUS | Status: AC
  Administered 2015-09-28: 1000 mL via INTRAVENOUS

## 2015-09-28 MED ORDER — PROMETHAZINE HCL 25 MG PO TABS
ORAL_TABLET | ORAL | Status: AC
  Administered 2015-09-28: 25 mg via ORAL
  Filled 2015-09-28: qty 1

## 2015-09-28 MED ORDER — ONDANSETRON HCL 4 MG/2ML IJ SOLN
4.0000 mg | Freq: Once | INTRAMUSCULAR | Status: AC
  Administered 2015-09-28: 4 mg via INTRAVENOUS
  Filled 2015-09-28: qty 2

## 2015-09-28 MED ORDER — PROMETHAZINE HCL 12.5 MG PO TABS: 25.0000 mg | ORAL_TABLET | Freq: Four times a day (QID) | ORAL | Status: DC | PRN

## 2015-09-28 MED ORDER — ONDANSETRON HCL 4 MG PO TABS: 4.0000 mg | ORAL_TABLET | Freq: Three times a day (TID) | ORAL | Status: DC | PRN

## 2015-09-28 MED ORDER — PROMETHAZINE HCL 25 MG PO TABS
25.0000 mg | ORAL_TABLET | Freq: Once | ORAL | Status: AC
  Administered 2015-09-28: 25 mg via ORAL

## 2015-09-28 NOTE — Discharge Instructions (Signed)
You were evaluated for vomiting and diarrhea, and you were treated with IV hydration and nausea medicine here in the emergency department. I am most suspicious that this is a virus. We discussed your white blood cell count was elevated, however without any significant abdominal pain, we chose to avoid CT scan today, and watch and wait. Return to the emergency department immediately for any worsening condition including any abdominal pain, fever, black or bloody stools or black or bloody vomitus. Return for dizziness, passing out, or any other symptoms concerning to you.   Nausea and Vomiting Nausea means you feel sick to your stomach. Throwing up (vomiting) is a reflex where stomach contents come out of your mouth. HOME CARE   Take medicine as told by your doctor.  Do not force yourself to eat. However, you do need to drink fluids.  If you feel like eating, eat a normal diet as told by your doctor.  Eat rice, wheat, potatoes, bread, lean meats, yogurt, fruits, and vegetables.  Avoid high-fat foods.  Drink enough fluids to keep your pee (urine) clear or pale yellow.  Ask your doctor how to replace body fluid losses (rehydrate). Signs of body fluid loss (dehydration) include:  Feeling very thirsty.  Dry lips and mouth.  Feeling dizzy.  Dark pee.  Peeing less than normal.  Feeling confused.  Fast breathing or heart rate. GET HELP RIGHT AWAY IF:   You have blood in your throw up.  You have black or bloody poop (stool).  You have a bad headache or stiff neck.  You feel confused.  You have bad belly (abdominal) pain.  You have chest pain or trouble breathing.  You do not pee at least once every 8 hours.  You have cold, clammy skin.  You keep throwing up after 24 to 48 hours.  You have a fever. MAKE SURE YOU:   Understand these instructions.  Will watch your condition.  Will get help right away if you are not doing well or get worse.   This information is not  intended to replace advice given to you by your health care provider. Make sure you discuss any questions you have with your health care provider.   Document Released: 04/26/2008 Document Revised: 01/31/2012 Document Reviewed: 04/09/2011 Elsevier Interactive Patient Education 2016 [place].  Diarrhea Diarrhea is watery poop (stool). It can make you feel weak, tired, thirsty, or give you a dry mouth (signs of dehydration). Watery poop is a sign of another problem, most often an infection. It often lasts 2-3 days. It can last longer if it is a sign of something serious. Take care of yourself as told by your doctor. HOME CARE   Drink 1 cup (8 ounces) of fluid each time you have watery poop.  Do not drink the following fluids:  Those that contain simple sugars (fructose, glucose, galactose, lactose, sucrose, maltose).  Sports drinks.  Fruit juices.  Whole milk products.  Sodas.  Drinks with caffeine (coffee, tea, soda) or alcohol.  Oral rehydration solution may be used if the doctor says it is okay. You may make your own solution. Follow this recipe:   - teaspoon table salt.   teaspoon baking soda.   teaspoon salt substitute containing potassium chloride.  1 tablespoons sugar.  1 liter (34 ounces) of water.  Avoid the following foods:  High fiber foods, such as raw fruits and vegetables.  Nuts, seeds, and whole grain breads and cereals.   Those that are sweetened with sugar  alcohols (xylitol, sorbitol, mannitol).  Try eating the following foods:  Starchy foods, such as rice, toast, pasta, low-sugar cereal, oatmeal, baked potatoes, crackers, and bagels.  Bananas.  Applesauce.  Eat probiotic-rich foods, such as yogurt and milk products that are fermented.  Wash your hands well after each time you have watery poop.  Only take medicine as told by your doctor.  Take a warm bath to help lessen burning or pain from having watery poop. GET HELP RIGHT AWAY IF:    You cannot drink fluids without throwing up (vomiting).  You keep throwing up.  You have blood in your poop, or your poop looks black and tarry.  You do not pee (urinate) in 6-8 hours, or there is only a small amount of very dark pee.  You have belly (abdominal) pain that gets worse or stays in the same spot (localizes).  You are weak, dizzy, confused, or light-headed.  You have a very bad headache.  Your watery poop gets worse or does not get better.  You have a fever or lasting symptoms for more than 2-3 days.  You have a fever and your symptoms suddenly get worse. MAKE SURE YOU:   Understand these instructions.  Will watch your condition.  Will get help right away if you are not doing well or get worse.   This information is not intended to replace advice given to you by your health care provider. Make sure you discuss any questions you have with your health care provider.   Document Released: 04/26/2008 Document Revised: 11/29/2014 Document Reviewed: 07/16/2012 Elsevier Interactive Patient Education [employer].

## 2015-09-28 NOTE — ED Provider Notes (Signed)
[facility] Emergency Department Provider Note   ____________________________________________  Time seen: Upon arrival to ED room I have reviewed the triage vital signs and the triage nursing note.  HISTORY  Chief Complaint Vomiting   Historian Patient  [name] is a 59 y.o. male who is here for vomiting and diarrhea since Friday, as this is now the third day.Multiple episodes of emesis and diarrhea. Nonbloody nonbilious emesis. Nonbloody diarrhea. No bad food. No out of country travel. No known sick contacts. Patient states his son noticed the smell of propane yesterday and they shut off the propane. He's had no improvement. No headache, dizziness, or lightheadedness. No known fever. Symptoms are moderate. No abdominal pain now. States that when he is actively heaving he does have some muscular soreness to the side of his abdomen.    Past Medical History  Diagnosis Date  . Hyperlipidemia   . Hypertension   . GERD (gastroesophageal reflux disease)   . Tobacco abuse   . COPD (chronic obstructive pulmonary disease) (HCC)     bronchitis  . Disorder of vocal cord   . Cocaine dependence     Last use 1998, attends 3 NA meetings weekly.  . Right shoulder pain     2/2 partial rotator cuff tear, tendinopathy, mild subacromial/subdeltioid bursitis, A/C joint arthropathy per MRI done in 3/07  . Arthritis   . Anxiety   . Compression fracture     L2  . CVA (cerebral infarction)   . Fatty liver   . Hx of cardiovascular stress test     ETT/Lexiscan-Myoview (10/15):  Inferior, inferoapical, apical lateral defect c/w mild ischemia, EF 66%; Intermediate Risk  . 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 Acute viral pharyngitis 03/20/2015  . Olecranon bursitis of left elbow 12/23/2014  . Embolic stroke ([place]) 62/95/2841  . Carotid stenosis 10/15/2014  . Innominate artery stenosis  ([place]) 09/19/2014  . 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 PAD (peripheral artery disease) ([place]) 08/23/2014  . Preventative health care 08/12/2014  . Occlusion and stenosis of carotid artery without mention of cerebral infarction 07/25/2014  . Subclavian steal syndrome 07/25/2014  . CAD (coronary artery disease) 07/10/2014  . CVA (cerebral infarction) 04/10/2014  . 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 Compression fracture of L2, with back and right groin pain 07/18/2013  . History of colon polyps 05/18/2012  . COPD (chronic obstructive pulmonary disease) ([place]) 12/24/2009  . Hyperlipemia 09/20/2006  . TOBACCO ABUSE 09/20/2006  . Essential hypertension 09/20/2006  . GERD 09/20/2006    Past Surgical History  Procedure Laterality Date  . Neck surgery    . Lumbar spine surgery  12/18/2013    L2     DR POOL  . Anterior lat lumbar fusion N/A 12/18/2013    Procedure: Lumbar Two Anteriorlateral Corpectomy w/ cage, interbody fusion, anterior plating, Lumbar One to Lumbar Three percutaneous pedicle screws;  Surgeon: Charlie Pitter, MD;  Location: [place] [facility];  Service: Neurosurgery;  Laterality: N/A;  . Tee without cardioversion N/A 12/25/2013    Procedure: TRANSESOPHAGEAL ECHOCARDIOGRAM (TEE);  Surgeon: Larey Dresser, MD;  Location: [facility] ENDOSCOPY;  Service: Cardiovascular;  Laterality: N/A;  . Arch aortogram, left carotid,left subclavian angiogram  08-23-2014  . Aorta -innomiate bypass N/A 10/15/2014    Procedure: ENDARTERCTOMY OF  INNOMINATE ARTERY ;  Surgeon: Elam Dutch, MD;  Location: [place];  Service: Vascular;  Laterality: N/A;  . Patch angioplasty Right 10/15/2014    Procedure: PATCH ANGIOPLASTY OF RIGHT SUBCLAVIAN ARTERY , RIGHT COMMON CAROTID ARTERY & INNOMINATE ARTERY;  Surgeon: Elam Dutch, MD;  Location: [place];  Service: Vascular;  Laterality: Right;  . Endarterectomy Right 10/15/2014    Procedure: ENDARTERECTOMY RIGHT  SUBCLAVIAN ARTERY;  Surgeon: Elam Dutch, MD;  Location: [place];  Service: Vascular;  Laterality: Right;  . Endarterectomy Right 10/15/2014    Procedure: ENDARTERECTOMY RIGHT COMMON CAROTID ARTERY;  Surgeon: Elam Dutch, MD;  Location: [place];  Service: Vascular;  Laterality: Right;  . Sternotomy N/A 10/15/2014    Procedure: PARTIAL STERNOTOMY & PLATING OF STERNUM;  Surgeon: Elam Dutch, MD;  Location: [place];  Service: Vascular;  Laterality: N/A;  . Arch aortogram N/A 08/23/2014    Procedure: ARCH AORTOGRAM;  Surgeon: Elam Dutch, MD;  Location: [facility] CATH LAB;  Service: Cardiovascular;  Laterality: N/A;  . Carotid angiogram Left 08/23/2014    Procedure: CAROTID ANGIOGRAM;  Surgeon: Elam Dutch, MD;  Location: [facility] CATH LAB;  Service: Cardiovascular;  Laterality: Left;  . Left heart catheterization with coronary angiogram N/A 09/12/2014    Procedure: LEFT HEART CATHETERIZATION WITH CORONARY ANGIOGRAM;  Surgeon: Josue Hector, MD;  Location: [facility] CATH LAB;  Service: Cardiovascular;  Laterality: N/A;    Current Outpatient Rx  Name  Route  Sig  Dispense  Refill  . ADVAIR DISKUS 500-50 MCG/DOSE AEPB      INHALE 1 PUFF INTO THE LUNGS EVERY 12 (TWELVE) HOURS.   180 each   3   . amLODipine (NORVASC) 10 MG tablet   Oral   Take 1 tablet (10 mg total) by mouth daily.   90 tablet   4   . aspirin EC 81 MG tablet   Oral   Take 81 mg by mouth daily.         Marland Kitchen atorvastatin (LIPITOR) 10 MG tablet   Oral   Take 1 tablet (10 mg total) by mouth daily.   30 tablet   11   . FLUoxetine (PROZAC) 10 MG capsule   Oral   Take 1 capsule (10 mg total) by mouth daily.   90 capsule   3   . gabapentin (NEURONTIN) 400 MG capsule      TAKE 2 CAPSULES (800 MG TOTAL) BY MOUTH 3 (THREE) TIMES DAILY.   180 capsule   3   . guaiFENesin (MUCINEX) 600 MG 12 hr tablet   Oral   Take 1 tablet (600 mg total) by mouth 2 (two) times daily. Patient not taking: Reported on 06/19/2015   60  tablet   2   . lisinopril-hydrochlorothiazide (PRINZIDE,ZESTORETIC) 20-12.5 MG per tablet      TAKE 1 TABLET BY MOUTH DAILY.   30 tablet   6   . meloxicam (MOBIC) 15 MG tablet      TAKE 1 TABLET BY MOUTH EVERY DAY   30 tablet   3   . omega-3 acid ethyl esters (LOVAZA) 1 G capsule      TAKE 2 CAPSULES (2 G TOTAL) BY MOUTH 2 (TWO) TIMES DAILY.   90 capsule   3   . ondansetron (ZOFRAN) 4 MG tablet   Oral   Take 1 tablet (4 mg total) by mouth every 8 (eight) hours as needed for nausea or vomiting.   10 tablet   0   . oxyCODONE-acetaminophen (PERCOCET) 7.5-325 MG per tablet   Oral  Take 1 tablet by mouth every 8 (eight) hours as needed for pain.   40 tablet   0   . OXYCONTIN 10 MG T12A 12 hr tablet      TAKE 1 TABLET BY ORAL ROUTE EVERY 12 HOURS      0     Dispense as written.   . pantoprazole (PROTONIX) 40 MG tablet      TAKE 1 TABLET (40 MG TOTAL) BY MOUTH DAILY.   30 tablet   6   . polyethylene glycol (MIRALAX / GLYCOLAX) packet   Oral   Take 17 g by mouth daily as needed (for constipation).         Marland Kitchen PROAIR HFA 108 (90 BASE) MCG/ACT inhaler      INHALE 2 PUFFS INTO THE LUNGS EVERY 6 (SIX) HOURS AS NEEDED FOR WHEEZING.   22.5 Inhaler   1   . promethazine (PHENERGAN) 12.5 MG tablet   Oral   Take 2 tablets (25 mg total) by mouth every 6 (six) hours as needed for nausea or vomiting.   10 tablet   0   . rOPINIRole (REQUIP) 0.5 MG tablet   Oral   Take 0.5 mg by mouth at bedtime.           Allergies Review of patient's allergies indicates no known allergies.  Family History  Problem Relation Age of Onset  . Diabetes Paternal Grandmother     1 st degree relatives  . Heart disease Father   . Diabetes Brother   . Heart disease Brother   . Muscular dystrophy Brother   . Muscular dystrophy Sister   . Heart attack Father   . Hypertension Paternal Grandmother     Social History Social History  Substance Use Topics  . Smoking status:  Current Every Day Smoker -- 1.00 packs/day for 48 years    Types: Cigarettes    Last Attempt to Quit: 09/23/2014  . Smokeless tobacco: Never Used  . Alcohol Use: No    Review of Systems  Constitutional: Negative for fever. Eyes: Negative for visual changes. ENT: Negative for sore throat. Cardiovascular: Negative for chest pain. Respiratory: Negative for shortness of breath. Gastrointestinal: As per history of present illness Genitourinary: Negative for dysuria. Musculoskeletal: Negative for back pain. Skin: Negative for rash. Neurological: Negative for headache. 10 point Review of Systems otherwise negative ____________________________________________   PHYSICAL EXAM:  VITAL SIGNS: ED Triage Vitals  Enc Vitals Group     BP 09/28/15 0847 153/110 mmHg     Pulse Rate 09/28/15 0846 93     Resp 09/28/15 0846 22     Temp 09/28/15 0846 98.9 F (37.2 C)     Temp Source 09/28/15 0846 Oral     SpO2 09/28/15 0846 95 %     Weight 09/28/15 0846 225 lb (102.059 kg)     Height 09/28/15 0846 5\' 11"  (1.803 m)     Head Cir --      Peak Flow --      Pain Score 09/28/15 0846 5     Pain Loc --      Pain Edu? --      Excl. in [place]? --      Constitutional: Alert and oriented. Well appearing and in no distress. Eyes: Conjunctivae are normal. PERRL. Normal extraocular movements. ENT   Head: Normocephalic and atraumatic.   Nose: No congestion/rhinnorhea.   Mouth/Throat: Mucous membranes are mildly dry.   Neck: No stridor. Cardiovascular/Chest: Normal rate, regular rhythm.  No murmurs, rubs, or gallops. Respiratory: Normal respiratory effort without tachypnea nor retractions. Breath sounds are clear and equal bilaterally. No wheezes/rales/rhonchi. Gastrointestinal: Soft. No distention, no guarding, no rebound. Nontender abdomen to palpation. No right lower quadrant point tenderness. No right upper quadrant point tenderness.  Genitourinary/rectal:Deferred Musculoskeletal:  Nontender with normal range of motion in all extremities. No joint effusions.  No lower extremity tenderness.  No edema. Neurologic:  Normal speech and language. No gross or focal neurologic deficits are appreciated. Skin:  Skin is warm, dry and intact. No rash noted. Psychiatric: Mood and affect are normal. Speech and behavior are normal. Patient exhibits appropriate insight and judgment.  ____________________________________________   EKG I, Lisa Roca, MD, the attending physician have personally viewed and interpreted all ECGs.  66 bpm. Normal sinus rhythm. Narrow QRS. Normal axis. Nonspecific T wave. ____________________________________________  LABS (pertinent positives/negatives)  Comprehensive metabolic panel significant for BUN 25 and potassium 3.3 Troponin 0.03 White blood count 16.8 with left shift. Hemoglobin 15.4 and platelet count 207  ____________________________________________  RADIOLOGY All Xrays were viewed by me. Imaging interpreted by Radiologist.  none __________________________________________  PROCEDURES  Procedure(s) performed: None  Critical Care performed: None  ____________________________________________   ED COURSE / ASSESSMENT AND PLAN  CONSULTATIONS: None  Pertinent labs & imaging results that were available during my care of the patient were reviewed by me and considered in my medical decision making (see chart for details).   Patient is overall well-appearing with no hypotension, tachycardia, fever, nor specific abdominal pain on palpation.  He is having symptomatic by me and diarrhea. His laboratory evaluation is overall reassuring. He is given IV fluid, as well as Zofran IV, and then Phenergan by mouth. His blood cell count is elevated, however he does not have any tenderness to palpation of his abdomen. I have extremely low suspicion for intra-abdominal emergency such as appendicitis, or diverticulitis. I discussed with him the option  of obtaining CT scan due to elevated white blood cell count in association with his vomiting diarrhea, versus watch and wait. He is declining CT scan at this point in time. We've discussed return precautions and he has a good understanding of what to return for. I think this is a very reasonable plan.  In terms of the complaining about swelling propane, I doubt that his symptoms are related to hydrocarbon exposure. His symptoms seemed very consistent with viral gastroenteritis which is very common in the community right now. There is been no headache or dizziness.  Patient was able to take by mouth here in the emergency department.  Patient / Family / Caregiver informed of clinical course, medical decision-making process, and agree with plan.   I discussed return precautions, follow-up instructions, and discharged instructions with patient and/or family.  ___________________________________________   FINAL CLINICAL IMPRESSION(S) / ED DIAGNOSES   Final diagnoses:  Nausea vomiting and diarrhea       Lisa Roca, MD 09/28/15 1049

## 2015-09-28 NOTE — ED Notes (Signed)
AAOx3.  Skin warm and dry.  D/C home 

## 2015-09-28 NOTE — ED Notes (Addendum)
States he developed nausea/vomiting and diarrhea on Friday. Unable to keep any meds down   States he takes  percocet for chronic back pain

## 2015-09-28 NOTE — ED Notes (Addendum)
Has been on Percocet for over 2 years. Has been vomiting and last took at dose around 330 this morning and threw up 5-10 minutes after taking. Did not look to see if pill was in trash. Pt also states he started smelling propane at his house around 1700 yesterday.

## 2015-10-13 ENCOUNTER — Other Ambulatory Visit: Payer: Self-pay | Admitting: Internal Medicine

## 2015-10-30 ENCOUNTER — Other Ambulatory Visit: Payer: Self-pay | Admitting: Internal Medicine

## 2016-01-01 ENCOUNTER — Ambulatory Visit: Payer: Medicaid Other | Admitting: Cardiovascular Disease

## 2016-01-26 ENCOUNTER — Encounter: Payer: Self-pay | Admitting: Family

## 2016-01-29 ENCOUNTER — Encounter (INDEPENDENT_AMBULATORY_CARE_PROVIDER_SITE_OTHER): Payer: Medicaid Other | Admitting: Family

## 2016-01-29 ENCOUNTER — Ambulatory Visit (HOSPITAL_COMMUNITY)
Admission: RE | Admit: 2016-01-29 | Discharge: 2016-01-29 | Disposition: A | Discharge status: Home / Self Care | Payer: Medicaid Other | Source: Ambulatory Visit | Attending: Family | Admitting: Family

## 2016-01-29 DIAGNOSIS — Z9889 Other specified postprocedural states: Secondary | ICD-10-CM | POA: Diagnosis not present

## 2016-01-29 DIAGNOSIS — I10 Essential (primary) hypertension: Secondary | ICD-10-CM | POA: Diagnosis not present

## 2016-01-29 DIAGNOSIS — I771 Stricture of artery: Secondary | ICD-10-CM | POA: Diagnosis not present

## 2016-01-29 DIAGNOSIS — Z720 Tobacco use: Secondary | ICD-10-CM | POA: Insufficient documentation

## 2016-01-29 DIAGNOSIS — E785 Hyperlipidemia, unspecified: Secondary | ICD-10-CM | POA: Insufficient documentation

## 2016-01-29 DIAGNOSIS — Z4889 Encounter for other specified surgical aftercare: Secondary | ICD-10-CM | POA: Insufficient documentation

## 2016-01-29 DIAGNOSIS — Z48812 Encounter for surgical aftercare following surgery on the circulatory system: Secondary | ICD-10-CM

## 2016-01-29 NOTE — Progress Notes (Signed)
Pt had carotid duplex performed, then apparently left after that.

## 2016-02-01 NOTE — Progress Notes (Signed)
Patient ID: Bradley Sawyer, male   DOB: 21-Jul-1956, 60 y.o.   MRN: [id number]    Cardiology Office Note    Date:  02/03/2016   ID:  Bradley Sawyer, DOB 07-05-1956, MRN [id number]  PCP:  Bradley Rim, MD  Cardiologist:  Dr. Jenkins Rouge      History of Present Illness: Bradley Sawyer is a 60 y.o. male a history of HTN, HL, COPD, tobacco abuse. He was seen by me in 2013  after a visit to the emergency room with chest pain. Cardiac CTA demonstrated nonobstructive CAD.  Patient had lumbar spine surgery in 11/2013. Postoperative course was complicated by embolic right hemispheric CVA.  The patient was recently evaluated by Dr. Oneida Alar for vascular surgery due to right subclavian stenosis.  This was found incidentally due to blood pressure discrepancy.  Patient did report lower visual field cut in the right eye at times. This was felt to possibly be related to his subclavian stenosis. Post partial sternotomy by Dr Fields/Gerhardt with endarderectomy of innominate, carotid and subclavian  Studies:  - TEE (2/15):  EF 55-60%, normal wall motion, normal caliber aortic arch and descending thoracic aorta with grade 3 plaque, mild LAE, normal RVSF, no cardiac source of emboli  - Echo (1/15):  EF 55-60%, normal wall motion  - Carotid US (9/15):  Bilateral ICA <40%; known right subclavian steal  - Cardiac CTA (11/13):  Ca Score 419, LM 0-25%, LAD 25-50%, ostial D2 0-25%, prox CFX 0-25%, OM1 0-25%, RCA 0-25%; small R lung nodules (4 mm) >> FU CT recommended 1 year (unless low risk for bronchogenic CA); severe hepatic steatosis - FU CT at [facility] in 8/14: no masses identified 10/15  myovue  Overall Impression: Intermediate risk stress nuclear study with a medium-sized (Extent 13%), moderate severity partially reversible (SDS 4) inferior, inferoapical and apical lateral defect consistent with mild ischemia. Poor exercise effort and marked hypertensive response to exercise, with PVC's and ventricular  trigeminy.  LV Ejection Fraction: 66%. LV Wall Motion: Normal Wall Motion  Cath 09/12/14  No CAD and normal EF    Recent Labs/Images:   Recent Labs  02/11/15 1413 02/12/15 0900 09/28/15 0905  NA 137  --  137  K 3.4*  --  3.3*  BUN 13  --  25*  CREATININE 0.82  --  0.93  ALT  --   --  32  HGB 13.6  --  15.4  LDLCALC NOT CALC  --   --   LDLDIRECT  --  94  --   HDL 36*  --   --      No results found.   Wt Readings from Last 3 Encounters:  02/03/16 98.939 kg (218 lb 1.9 oz)  09/28/15 102.059 kg (225 lb)  07/31/15 100.517 kg (221 lb 9.6 oz)     Past Medical History  Diagnosis Date  . Hyperlipidemia   . Hypertension   . GERD (gastroesophageal reflux disease)   . Tobacco abuse   . COPD (chronic obstructive pulmonary disease) (HCC)     bronchitis  . Disorder of vocal cord   . Cocaine dependence     Last use 1998, attends 3 NA meetings weekly.  . Right shoulder pain     2/2 partial rotator cuff tear, tendinopathy, mild subacromial/subdeltioid bursitis, A/C joint arthropathy per MRI done in 3/07  . Arthritis   . Anxiety   . Compression fracture     L2  . CVA (cerebral infarction)   .  Fatty liver   . Hx of cardiovascular stress test     ETT/Lexiscan-Myoview (10/15):  Inferior, inferoapical, apical lateral defect c/w mild ischemia, EF 66%; Intermediate Risk  . 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 ADVAIR DISKUS 500-50 MCG/DOSE AEPB INHALE 1 PUFF INTO THE LUNGS EVERY 12 (TWELVE) HOURS. 180 each 3  . amLODipine (NORVASC) 10 MG tablet Take 1 tablet (10 mg total) by mouth daily. 90 tablet 4  . aspirin EC 81 MG tablet Take 81 mg by mouth daily.    Marland Kitchen atorvastatin (LIPITOR) 10 MG tablet Take 1 tablet (10 mg total) by mouth daily. 30 tablet 11  . FLUoxetine (PROZAC) 10 MG capsule Take 1 capsule (10 mg total) by mouth daily. 90 capsule 3  . gabapentin (NEURONTIN) 400 MG capsule TAKE  2 CAPSULES (800 MG TOTAL) BY MOUTH 3 (THREE) TIMES DAILY. 180 capsule 3  . guaiFENesin (MUCINEX) 600 MG 12 hr tablet Take 1 tablet (600 mg total) by mouth 2 (two) times daily. 60 tablet 2  . lisinopril-hydrochlorothiazide (PRINZIDE,ZESTORETIC) 20-12.5 MG per tablet TAKE 1 TABLET BY MOUTH DAILY. 30 tablet 6  . meloxicam (MOBIC) 15 MG tablet TAKE 1 TABLET BY MOUTH EVERY DAY 30 tablet 3  . omega-3 acid ethyl esters (LOVAZA) 1 G capsule TAKE 2 CAPSULES (2 G TOTAL) BY MOUTH 2 (TWO) TIMES DAILY. 90 capsule 3  . ondansetron (ZOFRAN) 4 MG tablet Take 1 tablet (4 mg total) by mouth every 8 (eight) hours as needed for nausea or vomiting. 10 tablet 0  . oxyCODONE (ROXICODONE) 15 MG immediate release tablet Take 1 tablet by mouth 4 (four) times daily.    . pantoprazole (PROTONIX) 40 MG tablet TAKE 1 TABLET (40 MG TOTAL) BY MOUTH DAILY. 30 tablet 6  . polyethylene glycol (MIRALAX / GLYCOLAX) packet Take 17 g by mouth daily as needed (for constipation).    Marland Kitchen PROAIR HFA 108 (90 BASE) MCG/ACT inhaler INHALE 2 PUFFS INTO THE LUNGS EVERY 6 (SIX) HOURS AS NEEDED FOR WHEEZING. 22.5 Inhaler 1  . promethazine (PHENERGAN) 12.5 MG tablet Take 2 tablets (25 mg total) by mouth every 6 (six) hours as needed for nausea or vomiting. 10 tablet 0  . rOPINIRole (REQUIP) 0.5 MG tablet Take 0.5 mg by mouth at bedtime.     No current facility-administered medications for this visit.     Allergies:   Review of patient's allergies indicates no known allergies.   Social History:  The patient  reports that he has been smoking Cigarettes.  He has a 48 pack-year smoking history. He has never used smokeless tobacco. He reports that he does not drink alcohol or use illicit drugs.   Family History:  The patient's family history includes Diabetes in his brother and paternal grandmother; Heart attack in his father; Heart disease in his brother and father; Hypertension in his paternal grandmother; Muscular dystrophy in his brother and  sister.   ROS:  Please see the history of present illness.      All other systems reviewed and negative.    PHYSICAL EXAM: VS:  BP 90/50 mmHg  Pulse 49  Ht 5\' 11"  (1.803 m)  Wt 98.939 kg (218 lb 1.9 oz)  BMI 30.44 kg/m2  SpO2 90% Well nourished, well developed, in no acute distress HEENT: normal Neck: no JVDright subclavian bruit  Cardiac:  normal S1, S2; RRR; no murmurpost sternotomy  Lungs:  Decreased breath sounds bilaterally, course rhonchi  throughout  Abd: soft, nontender, no hepatomegaly Ext: no edema Skin: warm and dry Neuro:  CNs 2-12 intact, no focal abnormalities noted  EKG:   08/28/14  NSR, HR 67, normal axis, no ST changes    02/03/16  SB rate 43 otherwise normal    ASSESSMENT AND PLAN:  1.  CAD  Cath 10/15 no obstructive disease despite abnormal myovue no periop complications after sternotomy  2.  PAD (peripheral artery disease): Followup with Dr. Oneida Alar as planned.  Duplex 3/9 reviewed <40% bilateral ICA stenosis   3  Essential hypertension: Controlled.   4.  Hyperlipemia: Continue statin.   6.  TOBACCO ABUSE: He is trying to quit.   7.  Chronic obstructive pulmonary disease: Continue Proventil, Advair.    8. Bradycardia:  No AV block no presyncope not on AV nodal drugs except norvasc which rarely causes bradycardia  48 hr holter  Has had recent normal Cr/K     [employer]

## 2016-02-03 ENCOUNTER — Ambulatory Visit (INDEPENDENT_AMBULATORY_CARE_PROVIDER_SITE_OTHER): Payer: Medicaid Other | Admitting: Cardiovascular Disease

## 2016-02-03 ENCOUNTER — Other Ambulatory Visit: Payer: Self-pay | Admitting: Cardiovascular Disease

## 2016-02-03 ENCOUNTER — Encounter: Payer: Self-pay | Admitting: Cardiovascular Disease

## 2016-02-03 ENCOUNTER — Other Ambulatory Visit: Payer: Self-pay | Admitting: *Deleted

## 2016-02-03 ENCOUNTER — Ambulatory Visit: Payer: Medicaid Other

## 2016-02-03 VITALS — BP 90/50 | HR 49 | Ht 71.0 in | Wt 218.1 lb

## 2016-02-03 DIAGNOSIS — I2510 Atherosclerotic heart disease of native coronary artery without angina pectoris: Secondary | ICD-10-CM

## 2016-02-03 DIAGNOSIS — R001 Bradycardia, unspecified: Secondary | ICD-10-CM

## 2016-02-03 DIAGNOSIS — I6523 Occlusion and stenosis of bilateral carotid arteries: Secondary | ICD-10-CM

## 2016-02-03 DIAGNOSIS — I493 Ventricular premature depolarization: Secondary | ICD-10-CM

## 2016-02-03 NOTE — Patient Instructions (Addendum)
Medication Instructions:  Your physician recommends that you continue on your current medications as directed. Please refer to the Current Medication list given to you today.  Labwork: NONE  Testing/Procedures: Your physician has recommended that you wear a 48 hour holter monitor. Holter monitors are medical devices that record the heart's electrical activity. Doctors most often use these monitors to diagnose arrhythmias. Arrhythmias are problems with the speed or rhythm of the heartbeat. The monitor is a small, portable device. You can wear one while you do your normal daily activities. This is usually used to diagnose what is causing palpitations/syncope (passing out).  Follow-Up: Your physician recommends that you schedule a follow-up appointment next available with Dr. Johnsie Cancel.   If you need a refill on your cardiac medications before your next appointment, please call your pharmacy

## 2016-02-11 ENCOUNTER — Ambulatory Visit (INDEPENDENT_AMBULATORY_CARE_PROVIDER_SITE_OTHER): Payer: Medicaid Other

## 2016-02-11 DIAGNOSIS — I493 Ventricular premature depolarization: Secondary | ICD-10-CM

## 2016-02-11 DIAGNOSIS — R001 Bradycardia, unspecified: Secondary | ICD-10-CM

## 2016-02-29 ENCOUNTER — Emergency Department (HOSPITAL_COMMUNITY): Payer: Medicaid Other

## 2016-02-29 ENCOUNTER — Encounter (HOSPITAL_COMMUNITY): Payer: Self-pay | Admitting: Nurse Practitioner

## 2016-02-29 ENCOUNTER — Emergency Department (HOSPITAL_COMMUNITY)
Admission: EM | Admit: 2016-02-29 | Discharge: 2016-02-29 | Disposition: A | Discharge status: Home / Self Care | Payer: Medicaid Other | Attending: Emergency Medicine | Admitting: Emergency Medicine

## 2016-02-29 DIAGNOSIS — R1011 Right upper quadrant pain: Secondary | ICD-10-CM | POA: Diagnosis not present

## 2016-02-29 DIAGNOSIS — E785 Hyperlipidemia, unspecified: Secondary | ICD-10-CM | POA: Diagnosis not present

## 2016-02-29 DIAGNOSIS — Z8673 Personal history of transient ischemic attack (TIA), and cerebral infarction without residual deficits: Secondary | ICD-10-CM | POA: Insufficient documentation

## 2016-02-29 DIAGNOSIS — E876 Hypokalemia: Secondary | ICD-10-CM

## 2016-02-29 DIAGNOSIS — M545 Low back pain, unspecified: Secondary | ICD-10-CM

## 2016-02-29 DIAGNOSIS — F419 Anxiety disorder, unspecified: Secondary | ICD-10-CM | POA: Diagnosis not present

## 2016-02-29 DIAGNOSIS — G8929 Other chronic pain: Secondary | ICD-10-CM | POA: Diagnosis not present

## 2016-02-29 DIAGNOSIS — Z79899 Other long term (current) drug therapy: Secondary | ICD-10-CM | POA: Diagnosis not present

## 2016-02-29 DIAGNOSIS — Z9889 Other specified postprocedural states: Secondary | ICD-10-CM | POA: Insufficient documentation

## 2016-02-29 DIAGNOSIS — J441 Chronic obstructive pulmonary disease with (acute) exacerbation: Secondary | ICD-10-CM | POA: Diagnosis not present

## 2016-02-29 DIAGNOSIS — K219 Gastro-esophageal reflux disease without esophagitis: Secondary | ICD-10-CM | POA: Diagnosis not present

## 2016-02-29 DIAGNOSIS — I10 Essential (primary) hypertension: Secondary | ICD-10-CM | POA: Diagnosis not present

## 2016-02-29 DIAGNOSIS — Z8781 Personal history of (healed) traumatic fracture: Secondary | ICD-10-CM | POA: Insufficient documentation

## 2016-02-29 DIAGNOSIS — Z791 Long term (current) use of non-steroidal anti-inflammatories (NSAID): Secondary | ICD-10-CM | POA: Insufficient documentation

## 2016-02-29 DIAGNOSIS — F17210 Nicotine dependence, cigarettes, uncomplicated: Secondary | ICD-10-CM | POA: Insufficient documentation

## 2016-02-29 DIAGNOSIS — M1990 Unspecified osteoarthritis, unspecified site: Secondary | ICD-10-CM | POA: Diagnosis not present

## 2016-02-29 DIAGNOSIS — R52 Pain, unspecified: Secondary | ICD-10-CM

## 2016-02-29 DIAGNOSIS — Z7982 Long term (current) use of aspirin: Secondary | ICD-10-CM | POA: Insufficient documentation

## 2016-02-29 LAB — CBC WITH DIFFERENTIAL/PLATELET
BASOS ABS: 0 10*3/uL (ref 0.0–0.1)
Basophils Relative: 0 %
Eosinophils Absolute: 0 10*3/uL (ref 0.0–0.7)
Eosinophils Relative: 0 %
HCT: 38.1 % — ABNORMAL LOW (ref 39.0–52.0)
HEMOGLOBIN: 13 g/dL (ref 13.0–17.0)
LYMPHS PCT: 17 %
Lymphs Abs: 2.8 10*3/uL (ref 0.7–4.0)
MCH: 31 pg (ref 26.0–34.0)
MCHC: 34.1 g/dL (ref 30.0–36.0)
MCV: 90.9 fL (ref 78.0–100.0)
Monocytes Absolute: 1.2 10*3/uL — ABNORMAL HIGH (ref 0.1–1.0)
Monocytes Relative: 7 %
NEUTROS PCT: 76 %
Neutro Abs: 12.6 10*3/uL — ABNORMAL HIGH (ref 1.7–7.7)
Platelets: 223 10*3/uL (ref 150–400)
RBC: 4.19 MIL/uL — ABNORMAL LOW (ref 4.22–5.81)
RDW: 12.9 % (ref 11.5–15.5)
WBC: 16.6 10*3/uL — ABNORMAL HIGH (ref 4.0–10.5)

## 2016-02-29 LAB — URINE MICROSCOPIC-ADD ON

## 2016-02-29 LAB — URINALYSIS, ROUTINE W REFLEX MICROSCOPIC
GLUCOSE, UA: NEGATIVE mg/dL
Ketones, ur: 15 mg/dL — AB
Nitrite: NEGATIVE
PH: 5.5 (ref 5.0–8.0)
Protein, ur: NEGATIVE mg/dL
Specific Gravity, Urine: 1.021 (ref 1.005–1.030)

## 2016-02-29 LAB — LIPASE, BLOOD: Lipase: 25 U/L (ref 11–51)

## 2016-02-29 LAB — COMPREHENSIVE METABOLIC PANEL
ALBUMIN: 3.2 g/dL — AB (ref 3.5–5.0)
ALK PHOS: 95 U/L (ref 38–126)
ALT: 23 U/L (ref 17–63)
ANION GAP: 13 (ref 5–15)
AST: 17 U/L (ref 15–41)
BILIRUBIN TOTAL: 1.2 mg/dL (ref 0.3–1.2)
BUN: 15 mg/dL (ref 6–20)
CALCIUM: 9.4 mg/dL (ref 8.9–10.3)
CO2: 26 mmol/L (ref 22–32)
Chloride: 96 mmol/L — ABNORMAL LOW (ref 101–111)
Creatinine, Ser: 1.05 mg/dL (ref 0.61–1.24)
GFR calc non Af Amer: >60 mL/min (ref >60)
Glucose, Bld: 114 mg/dL — ABNORMAL HIGH (ref 65–99)
Potassium: 3.4 mmol/L — ABNORMAL LOW (ref 3.5–5.1)
Sodium: 135 mmol/L (ref 135–145)
TOTAL PROTEIN: 7.7 g/dL (ref 6.5–8.1)

## 2016-02-29 MED ORDER — KETOROLAC TROMETHAMINE 60 MG/2ML IM SOLN
60.0000 mg | Freq: Once | INTRAMUSCULAR | Status: AC
  Administered 2016-02-29: 60 mg via INTRAMUSCULAR
  Filled 2016-02-29: qty 2

## 2016-02-29 MED ORDER — POTASSIUM CHLORIDE CRYS ER 20 MEQ PO TBCR
40.0000 meq | EXTENDED_RELEASE_TABLET | Freq: Once | ORAL | Status: AC
  Administered 2016-02-29: 40 meq via ORAL
  Filled 2016-02-29: qty 2

## 2016-02-29 MED ORDER — LIDOCAINE 5 % EX PTCH
1.0000 | MEDICATED_PATCH | CUTANEOUS | Status: DC
  Administered 2016-02-29: 1 via TRANSDERMAL
  Filled 2016-02-29: qty 1

## 2016-02-29 MED ORDER — IOPAMIDOL (ISOVUE-300) INJECTION 61%
INTRAVENOUS | Status: AC
  Administered 2016-02-29: 100 mL
  Filled 2016-02-29: qty 100

## 2016-02-29 MED ORDER — DEXAMETHASONE SODIUM PHOSPHATE 10 MG/ML IJ SOLN
10.0000 mg | Freq: Once | INTRAMUSCULAR | Status: AC
  Administered 2016-02-29: 10 mg via INTRAMUSCULAR
  Filled 2016-02-29: qty 1

## 2016-02-29 MED ORDER — DEXAMETHASONE SODIUM PHOSPHATE 10 MG/ML IJ SOLN
10.0000 mg | Freq: Once | INTRAMUSCULAR | Status: DC
Start: 2016-02-29 — End: 2016-02-29

## 2016-02-29 NOTE — ED Provider Notes (Signed)
CSN: [id number]     Arrival date & time 02/29/16  1513 History   First MD Initiated Contact with Patient 02/29/16 1556     Chief Complaint  Patient presents with  . Back Pain     (Consider location/radiation/quality/duration/timing/severity/associated sxs/prior Treatment) HPI   Bradley Sawyer is a 60 y.o. male, with a history of chronic back pain, CVA, COPD, and arthritis, presenting to the ED with right lower back painThat began about 2 weeks ago, was intermittent, but is now been constant for the last 4 days. Patient rates the pain at 8 out of 10, stabbing in nature, radiating to his right flank and left lower back. Patient does have chronic lower back pain, but states that this pain is completely different. Patient has taken his 15 mg oxycodone IR medication with the last dose about 6 hours ago. Patient is seen by pain management and has a contract with them. Patient denies history of IV drug use, HIV, hepatitis, diabetes, kidney issues, or any other red flag history. Patient denies fever/chills, nausea/vomiting, neuro deficits, falls/trauma, or any other complaints. Patient has COPD and states that he is breathing to his normal ability.     Past Medical History  Diagnosis Date  . Hyperlipidemia   . Hypertension   . GERD (gastroesophageal reflux disease)   . Tobacco abuse   . COPD (chronic obstructive pulmonary disease) (HCC)     bronchitis  . Disorder of vocal cord   . Cocaine dependence     Last use 1998, attends 3 NA meetings weekly.  . Right shoulder pain     2/2 partial rotator cuff tear, tendinopathy, mild subacromial/subdeltioid bursitis, A/C joint arthropathy per MRI done in 3/07  . Arthritis   . Anxiety   . Compression fracture     L2  . CVA (cerebral infarction)   . Fatty liver   . Hx of cardiovascular stress test     ETT/Lexiscan-Myoview (10/15):  Inferior, inferoapical, apical lateral defect c/w mild ischemia, EF 66%; Intermediate Risk  . Atherosclerotic stenosis  of innominate artery 09/16/2014   Past Surgical History  Procedure Laterality Date  . Neck surgery    . Lumbar spine surgery  12/18/2013    L2     DR POOL  . Anterior lat lumbar fusion N/A 12/18/2013    Procedure: Lumbar Two Anteriorlateral Corpectomy w/ cage, interbody fusion, anterior plating, Lumbar One to Lumbar Three percutaneous pedicle screws;  Surgeon: Charlie Pitter, MD;  Location: [place] NEURO ORS;  Service: Neurosurgery;  Laterality: N/A;  . Tee without cardioversion N/A 12/25/2013    Procedure: TRANSESOPHAGEAL ECHOCARDIOGRAM (TEE);  Surgeon: Larey Dresser, MD;  Location: [facility] ENDOSCOPY;  Service: Cardiovascular;  Laterality: N/A;  . Arch aortogram, left carotid,left subclavian angiogram  08-23-2014  . Aorta -innomiate bypass N/A 10/15/2014    Procedure: ENDARTERCTOMY OF  INNOMINATE ARTERY ;  Surgeon: Elam Dutch, MD;  Location: [facility] OR;  Service: Vascular;  Laterality: N/A;  . Patch angioplasty Right 10/15/2014    Procedure: PATCH ANGIOPLASTY OF RIGHT SUBCLAVIAN ARTERY , RIGHT COMMON CAROTID ARTERY & INNOMINATE ARTERY;  Surgeon: Elam Dutch, MD;  Location: [place];  Service: Vascular;  Laterality: Right;  . Endarterectomy Right 10/15/2014    Procedure: ENDARTERECTOMY RIGHT SUBCLAVIAN ARTERY;  Surgeon: Elam Dutch, MD;  Location: [place];  Service: Vascular;  Laterality: Right;  . Endarterectomy Right 10/15/2014    Procedure: ENDARTERECTOMY RIGHT COMMON CAROTID ARTERY;  Surgeon: Elam Dutch, MD;  Location: [facility]  OR;  Service: Vascular;  Laterality: Right;  . Sternotomy N/A 10/15/2014    Procedure: PARTIAL STERNOTOMY & PLATING OF STERNUM;  Surgeon: Elam Dutch, MD;  Location: [place];  Service: Vascular;  Laterality: N/A;  . Arch aortogram N/A 08/23/2014    Procedure: ARCH AORTOGRAM;  Surgeon: Elam Dutch, MD;  Location: [facility] CATH LAB;  Service: Cardiovascular;  Laterality: N/A;  . Carotid angiogram Left 08/23/2014    Procedure: CAROTID ANGIOGRAM;  Surgeon: Elam Dutch,  MD;  Location: [facility] CATH LAB;  Service: Cardiovascular;  Laterality: Left;  . Left heart catheterization with coronary angiogram N/A 09/12/2014    Procedure: LEFT HEART CATHETERIZATION WITH CORONARY ANGIOGRAM;  Surgeon: Josue Hector, MD;  Location: [facility] CATH LAB;  Service: Cardiovascular;  Laterality: N/A;   Family History  Problem Relation Age of Onset  . Diabetes Paternal Grandmother     1 st degree relatives  . Heart disease Father   . Diabetes Brother   . Heart disease Brother   . Muscular dystrophy Brother   . Muscular dystrophy Sister   . Heart attack Father   . Hypertension Paternal Grandmother    Social History  Substance Use Topics  . Smoking status: Current Every Day Smoker -- 1.00 packs/day for 48 years    Types: Cigarettes    Last Attempt to Quit: 09/23/2014  . Smokeless tobacco: Never Used  . Alcohol Use: No    Review of Systems  Constitutional: Negative for fever and chills.  Respiratory: Negative for shortness of breath.   Cardiovascular: Negative for chest pain.  Gastrointestinal: Negative for nausea, vomiting, abdominal pain, diarrhea and constipation.  Genitourinary: Positive for flank pain. Negative for dysuria and hematuria.  Musculoskeletal: Positive for back pain. Negative for neck pain.  Neurological: Negative for dizziness, syncope, weakness, light-headedness, numbness and headaches.  All other systems reviewed and are negative.     Allergies  Review of patient's allergies indicates no known allergies.  Home Medications   Prior to Admission medications   Medication Sig Start Date End Date Taking? Authorizing Provider  ADVAIR DISKUS 500-50 MCG/DOSE AEPB INHALE 1 PUFF INTO THE LUNGS EVERY 12 (TWELVE) HOURS. 09/08/15   Carly Montey Hora, MD  amLODipine (NORVASC) 10 MG tablet Take 1 tablet (10 mg total) by mouth daily. 05/06/15 05/05/16  Juliet Rude, MD  aspirin EC 81 MG tablet Take 81 mg by mouth daily.    Historical Provider, MD  atorvastatin (LIPITOR)  10 MG tablet Take 1 tablet (10 mg total) by mouth daily. 03/14/15   Juliet Rude, MD  FLUoxetine (PROZAC) 10 MG capsule Take 1 capsule (10 mg total) by mouth daily. 04/03/15   Carly Montey Hora, MD  gabapentin (NEURONTIN) 400 MG capsule TAKE 2 CAPSULES (800 MG TOTAL) BY MOUTH 3 (THREE) TIMES DAILY. 09/08/15   Carly Montey Hora, MD  guaiFENesin (MUCINEX) 600 MG 12 hr tablet Take 1 tablet (600 mg total) by mouth 2 (two) times daily. 03/19/15 03/18/16  Carly J Rivet, MD  lisinopril-hydrochlorothiazide (PRINZIDE,ZESTORETIC) 20-12.5 MG per tablet TAKE 1 TABLET BY MOUTH DAILY. 07/08/15   Juliet Rude, MD  meloxicam (MOBIC) 15 MG tablet TAKE 1 TABLET BY MOUTH EVERY DAY 09/24/15   Juliet Rude, MD  omega-3 acid ethyl esters (LOVAZA) 1 G capsule TAKE 2 CAPSULES (2 G TOTAL) BY MOUTH 2 (TWO) TIMES DAILY. 07/29/15   Carly Montey Hora, MD  ondansetron (ZOFRAN) 4 MG tablet Take 1 tablet (4 mg total) by mouth every 8 (  eight) hours as needed for nausea or vomiting. 09/28/15   Lisa Roca, MD  oxyCODONE (ROXICODONE) 15 MG immediate release tablet Take 1 tablet by mouth 4 (four) times daily. 12/20/15   Historical Provider, MD  pantoprazole (PROTONIX) 40 MG tablet TAKE 1 TABLET (40 MG TOTAL) BY MOUTH DAILY. 07/08/15   Carly Montey Hora, MD  polyethylene glycol (MIRALAX / GLYCOLAX) packet Take 17 g by mouth daily as needed (for constipation).    Historical Provider, MD  PROAIR HFA 108 (90 BASE) MCG/ACT inhaler INHALE 2 PUFFS INTO THE LUNGS EVERY 6 (SIX) HOURS AS NEEDED FOR WHEEZING. 05/01/15   Juliet Rude, MD  promethazine (PHENERGAN) 12.5 MG tablet Take 2 tablets (25 mg total) by mouth every 6 (six) hours as needed for nausea or vomiting. 09/28/15   Lisa Roca, MD  rOPINIRole (REQUIP) 0.5 MG tablet Take 0.5 mg by mouth at bedtime.    Historical Provider, MD   BP 129/60 mmHg  Pulse 52  Temp(Src) 98.4 F (36.9 C) (Oral)  Resp 20  SpO2 100% Physical Exam  Constitutional: He is oriented to person, place, and time. He appears  well-developed and well-nourished. No distress.  HENT:  Head: Normocephalic and atraumatic.  Eyes: Conjunctivae and EOM are normal. Pupils are equal, round, and reactive to light.  Neck: Normal range of motion. Neck supple.  Cardiovascular: Normal rate, regular rhythm, normal heart sounds and intact distal pulses.   Pulmonary/Chest: Effort normal. No respiratory distress. He has wheezes (not new for patient).  Abdominal: Soft. There is tenderness in the right upper quadrant. There is no guarding.  Musculoskeletal: He exhibits no edema or tenderness.  Full ROM in all extremities and spine. No paraspinal tenderness.   Lymphadenopathy:    He has no cervical adenopathy.  Neurological: He is alert and oriented to person, place, and time. He has normal reflexes.  No sensory deficits. Strength 5/5 in all extremities. No gait disturbance. Coordination intact. Cranial nerves III-XII grossly intact. No facial droop.   Skin: Skin is warm and dry. He is not diaphoretic.  Psychiatric: He has a normal mood and affect. His behavior is normal.  Nursing note and vitals reviewed.   ED Course  Procedures (including critical care time) Labs Review Labs Reviewed  COMPREHENSIVE METABOLIC PANEL - Abnormal; Notable for the following:    Potassium 3.4 (*)    Chloride 96 (*)    Glucose, Bld 114 (*)    Albumin 3.2 (*)    All other components within normal limits  CBC WITH DIFFERENTIAL/PLATELET - Abnormal; Notable for the following:    WBC 16.6 (*)    RBC 4.19 (*)    HCT 38.1 (*)    Neutro Abs 12.6 (*)    Monocytes Absolute 1.2 (*)    All other components within normal limits  URINALYSIS, ROUTINE W REFLEX MICROSCOPIC (NOT AT [facility]) - Abnormal; Notable for the following:    Color, Urine AMBER (*)    Hgb urine dipstick MODERATE (*)    Bilirubin Urine SMALL (*)    Ketones, ur 15 (*)    Leukocytes, UA SMALL (*)    All other components within normal limits  URINE MICROSCOPIC-ADD ON - Abnormal; Notable for  the following:    Squamous Epithelial / LPF 0-5 (*)    Bacteria, UA RARE (*)    Casts HYALINE CASTS (*)    All other components within normal limits  URINE CULTURE  LIPASE, BLOOD    Imaging Review Dg Thoracic Spine W/swimmers  02/29/2016  CLINICAL DATA:  Pt states he lives with "chronic pain" that is worse today. States pain is in his R back/side/hip and is "throbbing and stabbing." pain is unrelieved by oxycodone. Denies any new injuries. EXAM: THORACIC SPINE - 3 VIEWS COMPARISON:  CT, 12/19/2014 FINDINGS: No acute fracture. Slight vertebral wedging at the T12 level. Posterior fixation hardware is incompletely imaged beginning at the L1 level. No spondylolisthesis. There are disc degenerative changes throughout the thoracic spine with mild loss disc height and endplate spurring. An old sternal fracture has been reduced with an anterior fusion plate. IMPRESSION: No acute fracture or acute finding. Disc degenerative changes throughout the thoracic spine. Changes from previous ORIF of a sternal fracture and changes from a previous lumbar spine fusion. Electronically Signed   By: Lajean Manes M.D.   On: 02/29/2016 16:58   Dg Lumbar Spine Complete  02/29/2016  CLINICAL DATA:  Chronic low back pain with right leg radiculopathy. No new injury, initial encounter EXAM: LUMBAR SPINE - COMPLETE 4+ VIEW COMPARISON:  03/21/2015 FINDINGS: Postsurgical changes are noted at L1-2 L3. The hardware appears stable when compared with the prior exam. Chronic compression deformities at L1 and L2 are noted. Vertebral body height is otherwise maintained. No anterolisthesis is noted. IMPRESSION: Postoperative change without acute abnormality. Electronically Signed   By: Inez Catalina M.D.   On: 02/29/2016 17:01   Ct Abdomen Pelvis W Contrast  02/29/2016  CLINICAL DATA:  Chronic pain, worsened today. EXAM: CT ABDOMEN AND PELVIS WITH CONTRAST TECHNIQUE: Multidetector CT imaging of the abdomen and pelvis was performed using the  standard protocol following bolus administration of intravenous contrast. CONTRAST:  1 ISOVUE-300 IOPAMIDOL (ISOVUE-300) INJECTION 61% COMPARISON:  08/06/2013, 05/10/2014 FINDINGS: There are noncalcified calculi or tumefactive sludge in the gallbladder lumen. No bile duct dilatation. Mild fatty infiltration of the liver without focal liver lesion. There are unremarkable appearances of the spleen, pancreas and adrenals. Kidneys are unremarkable with the exception of a 2.2 cm low-attenuation lesion in the right lower pole which is probably a cyst. No urinary tract calculi. Ureters and urinary bladder are unremarkable. The abdominal aorta is normal in caliber with moderate atherosclerotic calcification. The stomach, small bowel and colon are remarkable only for mild uncomplicated colonic diverticulosis. The appendix is normal. No acute inflammatory changes are evident in the abdomen or pelvis. There is no ascites. No significant skeletal lesions are evident. There is left lateral and transpedicular spinal fixation hardware from L1-L3, with an interbody spacer device at the L2 level. This is unchanged from 05/10/2014. IMPRESSION: 1. No acute findings are evident in the abdomen or pelvis. 2. Probable cholelithiasis. Faxed item mild fatty infiltration of the liver. 3. Stable postoperative changes of the lumbar spine. Electronically Signed   By: Andreas Newport M.D.   On: 02/29/2016 22:18   I have personally reviewed and evaluated these images and lab results as part of my medical decision-making.   EKG Interpretation None       Medications  lidocaine (LIDODERM) 5 % 1 patch (1 patch Transdermal Patch Applied 02/29/16 1654)  ketorolac (TORADOL) injection 60 mg (60 mg Intramuscular Given 02/29/16 1655)  dexamethasone (DECADRON) injection 10 mg (10 mg Intramuscular Given 02/29/16 1655)  potassium chloride SA (K-DUR,KLOR-CON) CR tablet 40 mEq (40 mEq Oral Given 02/29/16 1918)  iopamidol (ISOVUE-300) 61 % injection  (100 mLs  Contrast Given 02/29/16 2150)    MDM   Final diagnoses:  Right-sided low back pain without sciatica  Right upper quadrant pain  Hypokalemia    JKAI ELFMAN presents with right lower back pain radiating to the right flank and left lower back.  The odd thing about this patient's presentation is that his pain is not reproducible on palpation and that it is present in the right flank as well as the lower back. Patient states that this pain is different than his usual chronic pain and patient has tenderness to palpation of the right upper quadrant. Patient is nontoxic appearing, afebrile, not tachycardic, not tachypneic, maintains adequate SPO2 on room air, and is in no apparent distress. Patient has no signs of sepsis or other serious or life-threatening condition. Hypokalemia acknowledged and treated with oral potassium replacement. Pt improved significantly with the above treatments, to the point that the patient was observed to be sleeping comfortably on the bed. Upon reassessment, patient still has right upper quadrant tenderness. CT of the abdomen ordered due to physical exam abnormalities combined with the new and different nature of the patient's pain and his leukocytosis. Patient is not taking any medications that would logically cause a leukocytosis. CT shows no acute abnormalities and no explanation for the patient's pain. Patient's pain may yet be an alternative presentation of his chronic pain issues. Patient is pain-free upon discharge assessment. Patient meets no admission criteria. Patient to follow up with PCP should symptoms continue. Return precautions discussed. Patient voiced understanding of these instructions and is comfortable with discharge. Patient appears safe for discharge at this time. It should be noted that the patient's vital signs remained stable and within normal limits during his entire ED stay.  Filed Vitals:   02/29/16 1516 02/29/16 1808 02/29/16 2029  02/29/16 2304  BP: 106/85 107/61 138/76 129/60  Pulse: 101 69 62 52  Temp: 99.2 F (37.3 C)  98.6 F (37 C) 98.4 F (36.9 C)  TempSrc: Oral  Oral Oral  Resp: 22 20 14 20   SpO2: 93% 98% 93% 100%     Lorayne Bender, PA-C 02/29/16 2309  Dorie Rank, MD 03/01/16 [phone]

## 2016-02-29 NOTE — ED Notes (Signed)
Pt states he has "chronic pain" that is worse today. States pain is in his R back/side/hip and is "throbbing and stabbing." pain is unrelieved by oxycodone. Denies any new injuries

## 2016-02-29 NOTE — Discharge Instructions (Signed)
You have been seen today for back and flank pain. Your imaging and lab tests showed no acute abnormalities, other than low potassium. Follow up with PCP as soon as possible for reevaluation and chronic management. You should also follow up with your PCP due to your finding of low potassium. Return to ED should symptoms worsen.

## 2016-02-29 NOTE — ED Notes (Signed)
Pt transported to CT ?

## 2016-03-02 LAB — URINE CULTURE: Culture: 10000 — AB

## 2016-03-15 DIAGNOSIS — K602 Anal fissure, unspecified: Secondary | ICD-10-CM | POA: Insufficient documentation

## 2016-03-15 DIAGNOSIS — K644 Residual hemorrhoidal skin tags: Secondary | ICD-10-CM | POA: Insufficient documentation

## 2016-03-26 ENCOUNTER — Ambulatory Visit: Payer: Medicaid Other | Admitting: Cardiovascular Disease

## 2016-03-26 ENCOUNTER — Other Ambulatory Visit: Payer: Self-pay | Admitting: Internal Medicine

## 2016-03-29 NOTE — Telephone Encounter (Signed)
Last appointment 03/19/2015. No future appointments.

## 2016-04-16 ENCOUNTER — Encounter: Payer: Medicaid Other | Admitting: Cardiovascular Disease

## 2016-04-16 NOTE — Progress Notes (Signed)
Patient ID: Bradley Sawyer, male   DOB: 28-Jan-1956, 60 y.o.   MRN: [id number]    Cardiology Office Note    Date:  04/16/2016   ID:  Bradley Sawyer, DOB Apr 16, 1956, MRN [id number]  PCP:  Curly Rim, MD  Cardiologist:  Dr. Jenkins Rouge      History of Present Illness: Bradley Sawyer is a 60 y.o. male a history of HTN, HL, COPD, tobacco abuse. He was seen by me in 2013  after a visit to the emergency room with chest pain. Cardiac CTA demonstrated nonobstructive CAD.  Patient had lumbar spine surgery in 11/2013. Postoperative course was complicated by embolic right hemispheric CVA.  The patient was recently evaluated by Dr. Oneida Alar for vascular surgery due to right subclavian stenosis.  This was found incidentally due to blood pressure discrepancy.  Patient did report lower visual field cut in the right eye at times. This was felt to possibly be related to his subclavian stenosis. Post partial sternotomy by Dr Fields/Gerhardt with endarderectomy of innominate, carotid and subclavian  Studies:  - TEE (2/15):  EF 55-60%, normal wall motion, normal caliber aortic arch and descending thoracic aorta with grade 3 plaque, mild LAE, normal RVSF, no cardiac source of emboli  - Echo (1/15):  EF 55-60%, normal wall motion  - Carotid US (9/15):  Bilateral ICA <40%; known right subclavian steal  - Cardiac CTA (11/13):  Ca Score 419, LM 0-25%, LAD 25-50%, ostial D2 0-25%, prox CFX 0-25%, OM1 0-25%, RCA 0-25%; small R lung nodules (4 mm) >> FU CT recommended 1 year (unless low risk for bronchogenic CA); severe hepatic steatosis - FU CT at [facility] in 8/14: no masses identified 10/15  myovue  Overall Impression: Intermediate risk stress nuclear study with a medium-sized (Extent 13%), moderate severity partially reversible (SDS 4) inferior, inferoapical and apical lateral defect consistent with mild ischemia. Poor exercise effort and marked hypertensive response to exercise, with PVC's and ventricular  trigeminy.  LV Ejection Fraction: 66%. LV Wall Motion: Normal Wall Motion  Cath 09/12/14  No CAD and normal EF    Recent Labs/Images:   Recent Labs  02/29/16 1729  NA 135  K 3.4*  BUN 15  CREATININE 1.05  ALT 23  HGB 13.0     No results found.   Wt Readings from Last 3 Encounters:  02/03/16 98.939 kg (218 lb 1.9 oz)  09/28/15 102.059 kg (225 lb)  07/31/15 100.517 kg (221 lb 9.6 oz)     Past Medical History  Diagnosis Date  . Hyperlipidemia   . Hypertension   . GERD (gastroesophageal reflux disease)   . Tobacco abuse   . COPD (chronic obstructive pulmonary disease) (HCC)     bronchitis  . Disorder of vocal cord   . Cocaine dependence     Last use 1998, attends 3 NA meetings weekly.  . Right shoulder pain     2/2 partial rotator cuff tear, tendinopathy, mild subacromial/subdeltioid bursitis, A/C joint arthropathy per MRI done in 3/07  . Arthritis   . Anxiety   . Compression fracture     L2  . CVA (cerebral infarction)   . Fatty liver   . Hx of cardiovascular stress test     ETT/Lexiscan-Myoview (10/15):  Inferior, inferoapical, apical lateral defect c/w mild ischemia, EF 66%; Intermediate Risk  . 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 ADVAIR DISKUS 500-50 MCG/DOSE AEPB INHALE 1 PUFF  INTO THE LUNGS EVERY 12 (TWELVE) HOURS. 180 each 3  . amLODipine (NORVASC) 10 MG tablet Take 1 tablet (10 mg total) by mouth daily. 90 tablet 4  . aspirin EC 81 MG tablet Take 81 mg by mouth daily.    Marland Kitchen atorvastatin (LIPITOR) 10 MG tablet Take 1 tablet (10 mg total) by mouth daily. 30 tablet 11  . FLUoxetine (PROZAC) 10 MG capsule Take 1 capsule (10 mg total) by mouth daily. 90 capsule 3  . gabapentin (NEURONTIN) 400 MG capsule TAKE 2 CAPSULES (800 MG TOTAL) BY MOUTH 3 (THREE) TIMES DAILY. 180 capsule 3  . lisinopril-hydrochlorothiazide (PRINZIDE,ZESTORETIC) 20-12.5 MG per tablet TAKE 1  TABLET BY MOUTH DAILY. 30 tablet 6  . meloxicam (MOBIC) 15 MG tablet TAKE 1 TABLET BY MOUTH EVERY DAY 30 tablet 3  . omega-3 acid ethyl esters (LOVAZA) 1 G capsule TAKE 2 CAPSULES (2 G TOTAL) BY MOUTH 2 (TWO) TIMES DAILY. 90 capsule 3  . ondansetron (ZOFRAN) 4 MG tablet Take 1 tablet (4 mg total) by mouth every 8 (eight) hours as needed for nausea or vomiting. 10 tablet 0  . oxyCODONE (ROXICODONE) 15 MG immediate release tablet Take 1 tablet by mouth 4 (four) times daily.    . pantoprazole (PROTONIX) 40 MG tablet TAKE 1 TABLET (40 MG TOTAL) BY MOUTH DAILY. 30 tablet 6  . polyethylene glycol (MIRALAX / GLYCOLAX) packet Take 17 g by mouth daily as needed (for constipation).    Marland Kitchen PROAIR HFA 108 (90 BASE) MCG/ACT inhaler INHALE 2 PUFFS INTO THE LUNGS EVERY 6 (SIX) HOURS AS NEEDED FOR WHEEZING. 22.5 Inhaler 1  . promethazine (PHENERGAN) 12.5 MG tablet Take 2 tablets (25 mg total) by mouth every 6 (six) hours as needed for nausea or vomiting. 10 tablet 0  . rOPINIRole (REQUIP) 0.5 MG tablet Take 0.5 mg by mouth at bedtime.     No current facility-administered medications for this visit.     Allergies:   Review of patient's allergies indicates no known allergies.   Social History:  The patient  reports that he has been smoking Cigarettes.  He has a 48 pack-year smoking history. He has never used smokeless tobacco. He reports that he does not drink alcohol or use illicit drugs.   Family History:  The patient's family history includes Diabetes in his brother and paternal grandmother; Heart attack in his father; Heart disease in his brother and father; Hypertension in his paternal grandmother; Muscular dystrophy in his brother and sister.   ROS:  Please see the history of present illness.      All other systems reviewed and negative.    PHYSICAL EXAM: VS:  There were no vitals taken for this visit. Well nourished, well developed, in no acute distress HEENT: normal Neck: no JVDright subclavian bruit   Cardiac:  normal S1, S2; RRR; no murmurpost sternotomy  Lungs:  Decreased breath sounds bilaterally, course rhonchi throughout  Abd: soft, nontender, no hepatomegaly Ext: no edema Skin: warm and dry Neuro:  CNs 2-12 intact, no focal abnormalities noted  EKG:   08/28/14  NSR, HR 67, normal axis, no ST changes    02/03/16  SB rate 43 otherwise normal    ASSESSMENT AND PLAN:  1.  CAD  Cath 10/15 no obstructive disease despite abnormal myovue no periop complications after sternotomy for subclavian stenosis   2.  PAD (peripheral artery disease): Followup with Dr. Oneida Alar as planned.  Duplex 3/9 reviewed <40% bilateral ICA stenosis   3  Essential  hypertension: Controlled.   4.  Hyperlipemia: Continue statin.   6.  TOBACCO ABUSE: He is trying to quit.   7.  Chronic obstructive pulmonary disease: Continue Proventil, Advair.    8. Bradycardia:  No AV block no presyncope not on AV nodal drugs except norvasc which rarely causes bradycardia  48 hr holter  Has had recent normal Cr/K     Jenkins Rouge   This encounter was created in error - please disregard.

## 2016-04-21 ENCOUNTER — Encounter: Payer: Self-pay | Admitting: Cardiovascular Disease

## 2016-05-08 ENCOUNTER — Emergency Department: Payer: Medicaid Other

## 2016-05-08 ENCOUNTER — Observation Stay
Admission: EM | Admit: 2016-05-08 | Discharge: 2016-05-08 | Disposition: A | Discharge status: Home / Self Care | Payer: Medicaid Other | Attending: Internal Medicine | Admitting: Internal Medicine

## 2016-05-08 DIAGNOSIS — E785 Hyperlipidemia, unspecified: Secondary | ICD-10-CM | POA: Diagnosis not present

## 2016-05-08 DIAGNOSIS — I771 Stricture of artery: Secondary | ICD-10-CM | POA: Diagnosis not present

## 2016-05-08 DIAGNOSIS — Z8269 Family history of other diseases of the musculoskeletal system and connective tissue: Secondary | ICD-10-CM | POA: Diagnosis not present

## 2016-05-08 DIAGNOSIS — F17210 Nicotine dependence, cigarettes, uncomplicated: Secondary | ICD-10-CM | POA: Diagnosis not present

## 2016-05-08 DIAGNOSIS — R062 Wheezing: Secondary | ICD-10-CM | POA: Diagnosis present

## 2016-05-08 DIAGNOSIS — I10 Essential (primary) hypertension: Secondary | ICD-10-CM | POA: Diagnosis not present

## 2016-05-08 DIAGNOSIS — R7989 Other specified abnormal findings of blood chemistry: Secondary | ICD-10-CM | POA: Diagnosis present

## 2016-05-08 DIAGNOSIS — K219 Gastro-esophageal reflux disease without esophagitis: Secondary | ICD-10-CM | POA: Insufficient documentation

## 2016-05-08 DIAGNOSIS — Z8673 Personal history of transient ischemic attack (TIA), and cerebral infarction without residual deficits: Secondary | ICD-10-CM | POA: Diagnosis not present

## 2016-05-08 DIAGNOSIS — Z8249 Family history of ischemic heart disease and other diseases of the circulatory system: Secondary | ICD-10-CM | POA: Diagnosis not present

## 2016-05-08 DIAGNOSIS — Z86010 Personal history of colonic polyps: Secondary | ICD-10-CM | POA: Diagnosis not present

## 2016-05-08 DIAGNOSIS — F419 Anxiety disorder, unspecified: Secondary | ICD-10-CM | POA: Diagnosis not present

## 2016-05-08 DIAGNOSIS — J441 Chronic obstructive pulmonary disease with (acute) exacerbation: Secondary | ICD-10-CM | POA: Diagnosis not present

## 2016-05-08 DIAGNOSIS — K760 Fatty (change of) liver, not elsewhere classified: Secondary | ICD-10-CM | POA: Diagnosis not present

## 2016-05-08 DIAGNOSIS — I248 Other forms of acute ischemic heart disease: Secondary | ICD-10-CM | POA: Diagnosis not present

## 2016-05-08 DIAGNOSIS — Z833 Family history of diabetes mellitus: Secondary | ICD-10-CM | POA: Diagnosis not present

## 2016-05-08 DIAGNOSIS — G8929 Other chronic pain: Secondary | ICD-10-CM | POA: Diagnosis not present

## 2016-05-08 DIAGNOSIS — M1990 Unspecified osteoarthritis, unspecified site: Secondary | ICD-10-CM | POA: Diagnosis not present

## 2016-05-08 DIAGNOSIS — M4856XA Collapsed vertebra, not elsewhere classified, lumbar region, initial encounter for fracture: Secondary | ICD-10-CM | POA: Diagnosis not present

## 2016-05-08 DIAGNOSIS — Z7982 Long term (current) use of aspirin: Secondary | ICD-10-CM | POA: Diagnosis not present

## 2016-05-08 DIAGNOSIS — Z79899 Other long term (current) drug therapy: Secondary | ICD-10-CM | POA: Insufficient documentation

## 2016-05-08 DIAGNOSIS — R0902 Hypoxemia: Secondary | ICD-10-CM | POA: Diagnosis not present

## 2016-05-08 DIAGNOSIS — I739 Peripheral vascular disease, unspecified: Secondary | ICD-10-CM | POA: Insufficient documentation

## 2016-05-08 DIAGNOSIS — R778 Other specified abnormalities of plasma proteins: Secondary | ICD-10-CM | POA: Insufficient documentation

## 2016-05-08 DIAGNOSIS — F329 Major depressive disorder, single episode, unspecified: Secondary | ICD-10-CM | POA: Insufficient documentation

## 2016-05-08 DIAGNOSIS — I2510 Atherosclerotic heart disease of native coronary artery without angina pectoris: Secondary | ICD-10-CM | POA: Diagnosis not present

## 2016-05-08 DIAGNOSIS — Z981 Arthrodesis status: Secondary | ICD-10-CM | POA: Insufficient documentation

## 2016-05-08 DIAGNOSIS — I6529 Occlusion and stenosis of unspecified carotid artery: Secondary | ICD-10-CM | POA: Diagnosis not present

## 2016-05-08 LAB — BASIC METABOLIC PANEL
ANION GAP: 9 (ref 5–15)
BUN: 21 mg/dL — ABNORMAL HIGH (ref 6–20)
CALCIUM: 9.1 mg/dL (ref 8.9–10.3)
CO2: 29 mmol/L (ref 22–32)
Chloride: 94 mmol/L — ABNORMAL LOW (ref 101–111)
Creatinine, Ser: 1.12 mg/dL (ref 0.61–1.24)
GFR calc Af Amer: >60 mL/min (ref >60)
GFR calc non Af Amer: >60 mL/min (ref >60)
GLUCOSE: 113 mg/dL — AB (ref 65–99)
Potassium: 3.4 mmol/L — ABNORMAL LOW (ref 3.5–5.1)
Sodium: 132 mmol/L — ABNORMAL LOW (ref 135–145)

## 2016-05-08 LAB — CBC
HEMATOCRIT: 40.2 % (ref 40.0–52.0)
Hemoglobin: 13.7 g/dL (ref 13.0–18.0)
MCH: 30.6 pg (ref 26.0–34.0)
MCHC: 34.1 g/dL (ref 32.0–36.0)
MCV: 89.7 fL (ref 80.0–100.0)
Platelets: 194 10*3/uL (ref 150–440)
RBC: 4.48 MIL/uL (ref 4.40–5.90)
RDW: 13.5 % (ref 11.5–14.5)
WBC: 15 10*3/uL — AB (ref 3.8–10.6)

## 2016-05-08 LAB — TSH: TSH: 0.449 u[IU]/mL (ref 0.350–4.500)

## 2016-05-08 LAB — TROPONIN I
TROPONIN I: 0.13 ng/mL — AB (ref <0.031)
Troponin I: 0.16 ng/mL — ABNORMAL HIGH (ref <0.031)

## 2016-05-08 LAB — HEMOGLOBIN A1C: Hgb A1c MFr Bld: 5.8 % (ref 4.0–6.0)

## 2016-05-08 MED ORDER — SODIUM CHLORIDE 0.9 % IV SOLN: INTRAVENOUS | Status: DC

## 2016-05-08 MED ORDER — ALBUTEROL SULFATE (2.5 MG/3ML) 0.083% IN NEBU: 2.5000 mg | INHALATION_SOLUTION | RESPIRATORY_TRACT | Status: DC | PRN

## 2016-05-08 MED ORDER — OMEGA-3-ACID ETHYL ESTERS 1 G PO CAPS: 2.0000 g | ORAL_CAPSULE | Freq: Two times a day (BID) | ORAL | Status: DC

## 2016-05-08 MED ORDER — OXYCODONE HCL 5 MG PO TABS: 15.0000 mg | ORAL_TABLET | Freq: Four times a day (QID) | ORAL | Status: DC | PRN

## 2016-05-08 MED ORDER — AZITHROMYCIN 250 MG PO TABS: ORAL_TABLET | ORAL | Status: DC

## 2016-05-08 MED ORDER — ASPIRIN 81 MG PO CHEW
CHEWABLE_TABLET | ORAL | Status: AC
  Administered 2016-05-08: 324 mg via ORAL
  Filled 2016-05-08: qty 4

## 2016-05-08 MED ORDER — ISOSORBIDE MONONITRATE ER 30 MG PO TB24
30.0000 mg | ORAL_TABLET | Freq: Every day | ORAL | Status: DC
  Administered 2016-05-08: 30 mg via ORAL
  Filled 2016-05-08: qty 1

## 2016-05-08 MED ORDER — MOMETASONE FURO-FORMOTEROL FUM 200-5 MCG/ACT IN AERO
2.0000 | INHALATION_SPRAY | Freq: Two times a day (BID) | RESPIRATORY_TRACT | Status: DC
  Administered 2016-05-08: 2 via RESPIRATORY_TRACT
  Filled 2016-05-08: qty 8.8

## 2016-05-08 MED ORDER — ASPIRIN 81 MG PO CHEW
324.0000 mg | CHEWABLE_TABLET | Freq: Once | ORAL | Status: AC
  Administered 2016-05-08: 324 mg via ORAL

## 2016-05-08 MED ORDER — PREDNISONE 20 MG PO TABS: 20.0000 mg | ORAL_TABLET | Freq: Every day | ORAL | Status: DC

## 2016-05-08 MED ORDER — TIOTROPIUM BROMIDE MONOHYDRATE 18 MCG IN CAPS
18.0000 ug | ORAL_CAPSULE | Freq: Every day | RESPIRATORY_TRACT | Status: DC
  Administered 2016-05-08: 18 ug via RESPIRATORY_TRACT
  Filled 2016-05-08: qty 5

## 2016-05-08 MED ORDER — ACETAMINOPHEN 650 MG RE SUPP: 650.0000 mg | Freq: Four times a day (QID) | RECTAL | Status: DC | PRN

## 2016-05-08 MED ORDER — ROPINIROLE HCL 1 MG PO TABS: 1.0000 mg | ORAL_TABLET | Freq: Every day | ORAL | Status: DC

## 2016-05-08 MED ORDER — ONDANSETRON HCL 4 MG/2ML IJ SOLN: 4.0000 mg | Freq: Four times a day (QID) | INTRAMUSCULAR | Status: DC | PRN

## 2016-05-08 MED ORDER — ASPIRIN 325 MG PO TBEC: 325.0000 mg | DELAYED_RELEASE_TABLET | Freq: Every day | ORAL | Status: DC

## 2016-05-08 MED ORDER — PREDNISONE 10 MG (21) PO TBPK: 10.0000 mg | ORAL_TABLET | Freq: Every day | ORAL | Status: DC

## 2016-05-08 MED ORDER — ASPIRIN EC 325 MG PO TBEC: 325.0000 mg | DELAYED_RELEASE_TABLET | Freq: Every day | ORAL | Status: DC

## 2016-05-08 MED ORDER — AZITHROMYCIN 250 MG PO TABS
500.0000 mg | ORAL_TABLET | Freq: Every day | ORAL | Status: AC
  Administered 2016-05-08: 500 mg via ORAL
  Filled 2016-05-08: qty 2

## 2016-05-08 MED ORDER — PREDNISONE 50 MG PO TABS: 50.0000 mg | ORAL_TABLET | Freq: Every day | ORAL | Status: DC

## 2016-05-08 MED ORDER — DOCUSATE SODIUM 100 MG PO CAPS
100.0000 mg | ORAL_CAPSULE | Freq: Two times a day (BID) | ORAL | Status: DC
  Administered 2016-05-08: 100 mg via ORAL
  Filled 2016-05-08: qty 1

## 2016-05-08 MED ORDER — ONDANSETRON HCL 4 MG PO TABS: 4.0000 mg | ORAL_TABLET | Freq: Four times a day (QID) | ORAL | Status: DC | PRN

## 2016-05-08 MED ORDER — POTASSIUM CHLORIDE 20 MEQ PO PACK: 40.0000 meq | PACK | Freq: Once | ORAL | Status: DC

## 2016-05-08 MED ORDER — DOCUSATE SODIUM 100 MG PO CAPS: 100.0000 mg | ORAL_CAPSULE | Freq: Two times a day (BID) | ORAL | Status: DC

## 2016-05-08 MED ORDER — ISOSORBIDE MONONITRATE ER 30 MG PO TB24: 30.0000 mg | ORAL_TABLET | Freq: Every day | ORAL | Status: DC

## 2016-05-08 MED ORDER — GABAPENTIN 400 MG PO CAPS
400.0000 mg | ORAL_CAPSULE | Freq: Three times a day (TID) | ORAL | Status: DC
  Administered 2016-05-08: 400 mg via ORAL
  Filled 2016-05-08: qty 1

## 2016-05-08 MED ORDER — IPRATROPIUM-ALBUTEROL 0.5-2.5 (3) MG/3ML IN SOLN
3.0000 mL | Freq: Once | RESPIRATORY_TRACT | Status: AC
  Administered 2016-05-08: 3 mL via RESPIRATORY_TRACT
  Filled 2016-05-08: qty 3

## 2016-05-08 MED ORDER — MORPHINE SULFATE (PF) 2 MG/ML IV SOLN: 2.0000 mg | INTRAVENOUS | Status: DC | PRN

## 2016-05-08 MED ORDER — ALBUTEROL SULFATE HFA 108 (90 BASE) MCG/ACT IN AERS: 2.0000 | INHALATION_SPRAY | RESPIRATORY_TRACT | Status: DC | PRN

## 2016-05-08 MED ORDER — PREDNISONE 10 MG PO TABS: 10.0000 mg | ORAL_TABLET | Freq: Every day | ORAL | Status: DC

## 2016-05-08 MED ORDER — PANTOPRAZOLE SODIUM 40 MG PO TBEC
40.0000 mg | DELAYED_RELEASE_TABLET | Freq: Every day | ORAL | Status: DC
  Administered 2016-05-08: 40 mg via ORAL
  Filled 2016-05-08: qty 1

## 2016-05-08 MED ORDER — ATORVASTATIN CALCIUM 10 MG PO TABS
10.0000 mg | ORAL_TABLET | Freq: Every day | ORAL | Status: DC
  Administered 2016-05-08: 10 mg via ORAL
  Filled 2016-05-08: qty 1

## 2016-05-08 MED ORDER — ASPIRIN EC 81 MG PO TBEC
81.0000 mg | DELAYED_RELEASE_TABLET | Freq: Every day | ORAL | Status: DC
  Administered 2016-05-08: 81 mg via ORAL
  Filled 2016-05-08: qty 1

## 2016-05-08 MED ORDER — LISINOPRIL-HYDROCHLOROTHIAZIDE 20-12.5 MG PO TABS: 1.0000 | ORAL_TABLET | Freq: Every day | ORAL | Status: DC

## 2016-05-08 MED ORDER — POTASSIUM CHLORIDE CRYS ER 20 MEQ PO TBCR
40.0000 meq | EXTENDED_RELEASE_TABLET | ORAL | Status: AC
  Administered 2016-05-08: 40 meq via ORAL
  Filled 2016-05-08: qty 2

## 2016-05-08 MED ORDER — HYDROCHLOROTHIAZIDE 12.5 MG PO CAPS
12.5000 mg | ORAL_CAPSULE | Freq: Every day | ORAL | Status: DC
  Administered 2016-05-08: 12.5 mg via ORAL
  Filled 2016-05-08: qty 1

## 2016-05-08 MED ORDER — PREDNISONE 20 MG PO TABS: 40.0000 mg | ORAL_TABLET | Freq: Every day | ORAL | Status: DC

## 2016-05-08 MED ORDER — PROMETHAZINE HCL 25 MG PO TABS: 25.0000 mg | ORAL_TABLET | Freq: Four times a day (QID) | ORAL | Status: DC | PRN

## 2016-05-08 MED ORDER — AZITHROMYCIN 250 MG PO TABS: 250.0000 mg | ORAL_TABLET | Freq: Every day | ORAL | Status: DC

## 2016-05-08 MED ORDER — METOPROLOL TARTRATE 100 MG PO TABS
100.0000 mg | ORAL_TABLET | Freq: Two times a day (BID) | ORAL | Status: DC
  Administered 2016-05-08: 100 mg via ORAL
  Filled 2016-05-08: qty 1

## 2016-05-08 MED ORDER — PREDNISONE 20 MG PO TABS: 30.0000 mg | ORAL_TABLET | Freq: Every day | ORAL | Status: DC

## 2016-05-08 MED ORDER — ACETAMINOPHEN 325 MG PO TABS: 650.0000 mg | ORAL_TABLET | Freq: Four times a day (QID) | ORAL | Status: DC | PRN

## 2016-05-08 MED ORDER — FLUOXETINE HCL 10 MG PO CAPS
10.0000 mg | ORAL_CAPSULE | Freq: Every day | ORAL | Status: DC
  Administered 2016-05-08: 10 mg via ORAL
  Filled 2016-05-08: qty 1

## 2016-05-08 MED ORDER — POLYETHYLENE GLYCOL 3350 17 G PO PACK: 17.0000 g | PACK | Freq: Every day | ORAL | Status: DC | PRN

## 2016-05-08 MED ORDER — ENOXAPARIN SODIUM 40 MG/0.4ML ~~LOC~~ SOLN
40.0000 mg | SUBCUTANEOUS | Status: DC
  Administered 2016-05-08: 40 mg via SUBCUTANEOUS
  Filled 2016-05-08: qty 0.4

## 2016-05-08 MED ORDER — LISINOPRIL 20 MG PO TABS
20.0000 mg | ORAL_TABLET | Freq: Every day | ORAL | Status: DC
  Administered 2016-05-08: 20 mg via ORAL
  Filled 2016-05-08: qty 1

## 2016-05-08 MED ORDER — METHYLPREDNISOLONE SODIUM SUCC 125 MG IJ SOLR
125.0000 mg | Freq: Once | INTRAMUSCULAR | Status: AC
  Administered 2016-05-08: 125 mg via INTRAVENOUS
  Filled 2016-05-08: qty 2

## 2016-05-08 NOTE — ED Notes (Addendum)
Pt went to toilet pulled off all monitoring equipment, equip and O2 reapplied

## 2016-05-08 NOTE — Progress Notes (Signed)
Bradley Sawyer is a 60 y.o. male  [id number]  Primary Cardiologist: Neoma Laming Reason for Consultation: Elevated troponin  HPI: This is a is a 59 year old white male with a history of hypertension hyperlipidemia and tobacco use and COPD presented to the hospital with mildly elevated troponin troponin. Patient states that he was working doing some sanding of the floors and was wearing a mask but developed some cough shortness of breath and was admitted to the hospital because his troponin was slightly elevated. Patient denies any chest pain or shortness of breath at this time.   Review of Systems: No orthopnea PND or leg swelling   Past Medical History  Diagnosis Date  . Hyperlipidemia   . Hypertension   . GERD (gastroesophageal reflux disease)   . Tobacco abuse   . COPD (chronic obstructive pulmonary disease) (HCC)     bronchitis  . Disorder of vocal cord   . Cocaine dependence     Last use 1998, attends 3 NA meetings weekly.  . Right shoulder pain     2/2 partial rotator cuff tear, tendinopathy, mild subacromial/subdeltioid bursitis, A/C joint arthropathy per MRI done in 3/07  . Arthritis   . Anxiety   . Compression fracture     L2  . CVA (cerebral infarction)   . Fatty liver   . Hx of cardiovascular stress test     ETT/Lexiscan-Myoview (10/15):  Inferior, inferoapical, apical lateral defect c/w mild ischemia, EF 66%; Intermediate Risk  . 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 ADVAIR DISKUS 500-50 MCG/DOSE AEPB INHALE 1 PUFF INTO THE LUNGS EVERY 12 (TWELVE) HOURS. 180 each 3  . albuterol (ACCUNEB) 1.25 MG/3ML nebulizer solution Take 3 mLs by nebulization every 6 (six) hours as needed.    Marland Kitchen aspirin EC 81 MG tablet Take 81 mg by mouth daily.    Marland Kitchen atorvastatin (LIPITOR) 10 MG tablet Take 1 tablet (10 mg total) by mouth daily. 30 tablet 11  . FLUoxetine (PROZAC) 10 MG capsule Take 1  capsule (10 mg total) by mouth daily. 90 capsule 3  . gabapentin (NEURONTIN) 400 MG capsule TAKE 2 CAPSULES (800 MG TOTAL) BY MOUTH 3 (THREE) TIMES DAILY. 180 capsule 3  . lisinopril-hydrochlorothiazide (PRINZIDE,ZESTORETIC) 20-12.5 MG per tablet TAKE 1 TABLET BY MOUTH DAILY. 30 tablet 6  . meloxicam (MOBIC) 15 MG tablet TAKE 1 TABLET BY MOUTH EVERY DAY 30 tablet 3  . metoprolol (LOPRESSOR) 100 MG tablet Take 100 mg by mouth 2 (two) times daily.    Marland Kitchen omega-3 acid ethyl esters (LOVAZA) 1 G capsule TAKE 2 CAPSULES (2 G TOTAL) BY MOUTH 2 (TWO) TIMES DAILY. 90 capsule 3  . ondansetron (ZOFRAN) 4 MG tablet Take 1 tablet (4 mg total) by mouth every 8 (eight) hours as needed for nausea or vomiting. 10 tablet 0  . oxyCODONE (ROXICODONE) 15 MG immediate release tablet Take 1 tablet by mouth 4 (four) times daily.    . pantoprazole (PROTONIX) 40 MG tablet TAKE 1 TABLET (40 MG TOTAL) BY MOUTH DAILY. 30 tablet 6  . polyethylene glycol (MIRALAX / GLYCOLAX) packet Take 17 g by mouth daily as needed (for constipation).    Marland Kitchen PROAIR HFA 108 (90 BASE) MCG/ACT inhaler INHALE 2 PUFFS INTO THE LUNGS EVERY 6 (SIX) HOURS AS NEEDED FOR WHEEZING. 22.5 Inhaler 1  . rOPINIRole (REQUIP) 1 MG tablet Take 1 mg by mouth at bedtime.    Marland Kitchen  tiotropium (SPIRIVA) 18 MCG inhalation capsule Place 18 mcg into inhaler and inhale daily.    . promethazine (PHENERGAN) 12.5 MG tablet Take 2 tablets (25 mg total) by mouth every 6 (six) hours as needed for nausea or vomiting. 10 tablet 0     . aspirin EC  81 mg Oral Daily  . atorvastatin  10 mg Oral Daily  . azithromycin  500 mg Oral Daily   Followed by  . [START ON 05/09/2016] azithromycin  250 mg Oral Daily  . docusate sodium  100 mg Oral BID  . enoxaparin (LOVENOX) injection  40 mg Subcutaneous Q24H  . FLUoxetine  10 mg Oral Daily  . gabapentin  400 mg Oral TID  . lisinopril  20 mg Oral Daily   And  . hydrochlorothiazide  12.5 mg Oral Daily  . isosorbide mononitrate  30 mg Oral  Daily  . metoprolol  100 mg Oral BID  . mometasone-formoterol  2 puff Inhalation BID  . omega-3 acid ethyl esters  2 g Oral BID  . pantoprazole  40 mg Oral Daily  . [START ON 05/09/2016] predniSONE  50 mg Oral Q breakfast   Followed by  . [START ON 05/10/2016] predniSONE  40 mg Oral Q breakfast   Followed by  . [START ON 05/11/2016] predniSONE  30 mg Oral Q breakfast   Followed by  . [START ON 05/12/2016] predniSONE  20 mg Oral Q breakfast   Followed by  . [START ON 05/13/2016] predniSONE  10 mg Oral Q breakfast  . rOPINIRole  1 mg Oral QHS  . tiotropium  18 mcg Inhalation Daily    Infusions: . sodium chloride      No Known Allergies  Social History   Social History  . Marital Status: Divorced    Spouse Name: N/A  . Number of Children: 1  . Years of Education: GED   Occupational History  . Disabled    Social History Main Topics  . Smoking status: Current Every Day Smoker -- 1.00 packs/day for 48 years    Types: Cigarettes    Last Attempt to Quit: 09/23/2014  . Smokeless tobacco: Never Used  . Alcohol Use: No  . Drug Use: No     Comment: no recent use  . Sexual Activity: Not on file   Other Topics Concern  . Not on file   Social History Narrative   Patient is single with one child.   Patient is right handed.   Patient has his GED.   Patient drinks 5 or more cups daily.    Family History  Problem Relation Age of Onset  . Diabetes Paternal Grandmother     1 st degree relatives  . Heart disease Father   . Diabetes Brother   . Heart disease Brother   . Muscular dystrophy Brother   . Muscular dystrophy Sister   . Heart attack Father   . Hypertension Paternal Grandmother     PHYSICAL EXAM: Filed Vitals:   05/08/16 0904 05/08/16 0943  BP:  125/72  Pulse: 53 65  Temp:  97.6 F (36.4 C)  Resp: 14 16    No intake or output data in the 24 hours ending 05/08/16 1036  General:  Well appearing. No respiratory difficulty HEENT: normal Neck: supple. no JVD.  Carotids 2+ bilat; no bruits. No lymphadenopathy or thryomegaly appreciated. Cor: PMI nondisplaced. Regular rate & rhythm. No rubs, gallops or murmurs. Lungs: clear Abdomen: soft, nontender, nondistended. No hepatosplenomegaly. No bruits or masses.  Good bowel sounds. Extremities: no cyanosis, clubbing, rash, edema Neuro: alert & oriented x 3, cranial nerves grossly intact. moves all 4 extremities w/o difficulty. Affect pleasant.  [id number] rhythm with no acute changes  Results for orders placed or performed during the hospital encounter of 05/08/16 (from the past 24 hour(s))  CBC     Status: Abnormal   Collection Time: 05/08/16  2:49 AM  Result Value Ref Range   WBC 15.0 (H) 3.8 - 10.6 K/uL   RBC 4.48 4.40 - 5.90 MIL/uL   Hemoglobin 13.7 13.0 - 18.0 g/dL   HCT 40.2 40.0 - 52.0 %   MCV 89.7 80.0 - 100.0 fL   MCH 30.6 26.0 - 34.0 pg   MCHC 34.1 32.0 - 36.0 g/dL   RDW 13.5 11.5 - 14.5 %   Platelets 194 150 - 440 K/uL  Basic metabolic panel     Status: Abnormal   Collection Time: 05/08/16  2:49 AM  Result Value Ref Range   Sodium 132 (L) 135 - 145 mmol/L   Potassium 3.4 (L) 3.5 - 5.1 mmol/L   Chloride 94 (L) 101 - 111 mmol/L   CO2 29 22 - 32 mmol/L   Glucose, Bld 113 (H) 65 - 99 mg/dL   BUN 21 (H) 6 - 20 mg/dL   Creatinine, Ser 1.12 0.61 - 1.24 mg/dL   Calcium 9.1 8.9 - 10.3 mg/dL   GFR calc non Af Amer >60 >60 mL/min   GFR calc Af Amer >60 >60 mL/min   Anion gap 9 5 - 15  Troponin I     Status: Abnormal   Collection Time: 05/08/16  2:49 AM  Result Value Ref Range   Troponin I 0.16 (H) <0.031 ng/mL   Dg Chest 2 View  05/08/2016  CLINICAL DATA:  Increasing shortness of breath since Monday. Exposure to dust. EXAM: CHEST  2 VIEW COMPARISON:  11/12/2014 FINDINGS: Postoperative changes in the mediastinum. Hyperinflation. Normal heart size and pulmonary vascularity. No focal airspace disease or consolidation in the lungs. No blunting of costophrenic angles. No pneumothorax.  Mediastinal contours appear intact. Postoperative changes in the lumbar spine. IMPRESSION: Pulmonary hyperinflation.  No evidence of active pulmonary disease. Electronically Signed   By: Lucienne Capers M.D.   On: 05/08/2016 03:03     ASSESSMENT AND PLAN:Mildly elevated with a troponin most likely due to demand ischemia with no chest pain and no acute EKG changes. Advise getting a second troponin F is also unremarkable may go home on aspirin 325 mg by mouth daily and isosorbide 30 mg by mouth daily. Patient appears to be reliable and states that he will come Tuesday at 1:00 and will do outpatient workups including echo and a stress test.  Maurisa Tesmer A

## 2016-05-08 NOTE — ED Notes (Signed)
Spoke with Dr. Margaretmary Eddy, patient should be admitted to telemetry bed but she is 40 minutes away from the hospital and is unable to put the order in at this time.

## 2016-05-08 NOTE — Discharge Summary (Signed)
[facility] NAME: Bradley Sawyer    MR#:  [id number]  DATE OF BIRTH:  [birth year]  DATE OF ADMISSION:  05/08/2016 ADMITTING PHYSICIAN: Harrie Foreman, MD  DATE OF DISCHARGE: 05/08/2016 PRIMARY CARE PHYSICIAN: Curly Rim, MD    ADMISSION DIAGNOSIS:  Wheezing [R06.2] Elevated troponin [R79.89]  DISCHARGE DIAGNOSIS:  Active Problems:   Elevated troponin Demand ischemia  SECONDARY DIAGNOSIS:   Past Medical History  Diagnosis Date  . Hyperlipidemia   . Hypertension   . GERD (gastroesophageal reflux disease)   . Tobacco abuse   . COPD (chronic obstructive pulmonary disease) (HCC)     bronchitis  . Disorder of vocal cord   . Cocaine dependence     Last use 1998, attends 3 NA meetings weekly.  . Right shoulder pain     2/2 partial rotator cuff tear, tendinopathy, mild subacromial/subdeltioid bursitis, A/C joint arthropathy per MRI done in 3/07  . Arthritis   . Anxiety   . Compression fracture     L2  . CVA (cerebral infarction)   . Fatty liver   . Hx of cardiovascular stress test     ETT/Lexiscan-Myoview (10/15):  Inferior, inferoapical, apical lateral defect c/w mild ischemia, EF 66%; Intermediate Risk  . 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 Please review history and physical for more details 1. Elevated troponin: Likely secondary to demand ischemia from increased respiratory effort.  Patient is asymptomatic during my examination Initial troponin 0.19 and repeat 0.13 Okay to discharge patient from cardiology standpoint. Dr. Humphrey Rolls has recommended outpatient follow-up with him on June 20 at 1 PM for outpatient stress test and echocardiogram Discharge home with Imdur 30 mg and aspirin 325 mg once daily  2. COPD exacerbation:  Increased sputum production and hypoxia upon presentation. Resolved with multiple  breathing treatments and Solu-Medrol.  Currently asymptomatic . Patient wants to go home We will discharge him home with  steroid taper. Azithromycin for anti-inflammatory effect.  Continue inhaled corticosteroid and Spiriva   3. Essential hypertension: Controlled; continue lisinopril with hydrochlorothiazide as well as metoprolol  4. CAD: Continue aspirin  5. Hyperlipidemia: Continue statin therapy  6. Chronic pain: Secondary to arthritis. Continue narcotics per home regimen. Hold meloxicam due to concern for CAD.  7. DVT prophylaxis: Lovenox  8. GI prophylaxis: Pantoprazole per home regimen  DISCHARGE CONDITIONS:   Fair  CONSULTS OBTAINED:  Treatment Team:  Dionisio David, MD   PROCEDURES None  DRUG ALLERGIES:  No Known Allergies  DISCHARGE MEDICATIONS:   Current Discharge Medication List    START taking these medications   Details  azithromycin (ZITHROMAX) 250 MG tablet Take 1 tablet by mouth once daily for 4 days Qty: 5 each, Refills: 0    docusate sodium (COLACE) 100 MG capsule Take 1 capsule (100 mg total) by mouth 2 (two) times daily. Qty: 10 capsule, Refills: 0    isosorbide mononitrate (IMDUR) 30 MG 24 hr tablet Take 1 tablet (30 mg total) by mouth daily. Qty: 30 tablet, Refills: 0    predniSONE (STERAPRED UNI-PAK 21 TAB) 10 MG (21) TBPK tablet Take 1 tablet (10 mg total) by mouth daily. Take 6 tablets by mouth for 1 day followed by  5 tablets by mouth for 1 day followed by  4 tablets by mouth for 1 day followed by  3 tablets by mouth for 1 day  followed by  2 tablets by mouth for 1 day followed by  1 tablet by mouth for a day and stop Qty: 21 tablet, Refills: 0      CONTINUE these medications which have CHANGED   Details  aspirin EC 325 MG EC tablet Take 1 tablet (325 mg total) by mouth daily. Qty: 30 tablet, Refills: 0      CONTINUE these medications which have NOT CHANGED   Details  ADVAIR DISKUS 500-50 MCG/DOSE AEPB INHALE 1 PUFF INTO THE  LUNGS EVERY 12 (TWELVE) HOURS. Qty: 180 each, Refills: 3    albuterol (ACCUNEB) 1.25 MG/3ML nebulizer solution Take 3 mLs by nebulization every 6 (six) hours as needed.    atorvastatin (LIPITOR) 10 MG tablet Take 1 tablet (10 mg total) by mouth daily. Qty: 30 tablet, Refills: 11    FLUoxetine (PROZAC) 10 MG capsule Take 1 capsule (10 mg total) by mouth daily. Qty: 90 capsule, Refills: 3    gabapentin (NEURONTIN) 400 MG capsule TAKE 2 CAPSULES (800 MG TOTAL) BY MOUTH 3 (THREE) TIMES DAILY. Qty: 180 capsule, Refills: 3    lisinopril-hydrochlorothiazide (PRINZIDE,ZESTORETIC) 20-12.5 MG per tablet TAKE 1 TABLET BY MOUTH DAILY. Qty: 30 tablet, Refills: 6    meloxicam (MOBIC) 15 MG tablet TAKE 1 TABLET BY MOUTH EVERY DAY Qty: 30 tablet, Refills: 3    metoprolol (LOPRESSOR) 100 MG tablet Take 100 mg by mouth 2 (two) times daily.    omega-3 acid ethyl esters (LOVAZA) 1 G capsule TAKE 2 CAPSULES (2 G TOTAL) BY MOUTH 2 (TWO) TIMES DAILY. Qty: 90 capsule, Refills: 3    ondansetron (ZOFRAN) 4 MG tablet Take 1 tablet (4 mg total) by mouth every 8 (eight) hours as needed for nausea or vomiting. Qty: 10 tablet, Refills: 0    oxyCODONE (ROXICODONE) 15 MG immediate release tablet Take 1 tablet by mouth 4 (four) times daily.    pantoprazole (PROTONIX) 40 MG tablet TAKE 1 TABLET (40 MG TOTAL) BY MOUTH DAILY. Qty: 30 tablet, Refills: 6    polyethylene glycol (MIRALAX / GLYCOLAX) packet Take 17 g by mouth daily as needed (for constipation).    PROAIR HFA 108 (90 BASE) MCG/ACT inhaler INHALE 2 PUFFS INTO THE LUNGS EVERY 6 (SIX) HOURS AS NEEDED FOR WHEEZING. Qty: 22.5 Inhaler, Refills: 1    rOPINIRole (REQUIP) 1 MG tablet Take 1 mg by mouth at bedtime.    tiotropium (SPIRIVA) 18 MCG inhalation capsule Place 18 mcg into inhaler and inhale daily.    promethazine (PHENERGAN) 12.5 MG tablet Take 2 tablets (25 mg total) by mouth every 6 (six) hours as needed for nausea or vomiting. Qty: 10 tablet,  Refills: 0         DISCHARGE INSTRUCTIONS:   Activity as tolerated Cardiac diet Follow-up with primary care physician in a week Follow-up with cardiology Dr. Humphrey Rolls on Tuesday, June 20 at 1 PM for outpatient stress test and echocardiogram   DIET:  Cardiac diet  DISCHARGE CONDITION:  Fair  ACTIVITY:  Activity as tolerated  OXYGEN:  Home Oxygen: No.   Oxygen Delivery: room air  DISCHARGE LOCATION:  home   If you experience worsening of your admission symptoms, develop shortness of breath, life threatening emergency, suicidal or homicidal thoughts you must seek medical attention immediately by calling 911 or calling your MD immediately  if symptoms less severe.  You Must read complete instructions/literature along with all the possible adverse reactions/side effects for all the Medicines you take and that have been prescribed to  you. Take any new Medicines after you have completely understood and accpet all the possible adverse reactions/side effects.   Please note  You were cared for by a hospitalist during your hospital stay. If you have any questions about your discharge medications or the care you received while you were in the hospital after you are discharged, you can call the unit and asked to speak with the hospitalist on call if the hospitalist that took care of you is not available. Once you are discharged, your primary care physician will handle any further medical issues. Please note that NO REFILLS for any discharge medications will be authorized once you are discharged, as it is imperative that you return to your primary care physician (or establish a relationship with a primary care physician if you do not have one) for your aftercare needs so that they can reassess your need for medications and monitor your lab values.     Today  Chief Complaint  Patient presents with  . Shortness of Breath   Patient is resting comfortably. Denies any chest pain or shortness  of breath. Wants to go home. Seen by cardiology Dr. Humphrey Rolls  ROS:  CONSTITUTIONAL: Denies fevers, chills. Denies any fatigue, weakness.  EYES: Denies blurry vision, double vision, eye pain. EARS, NOSE, THROAT: Denies tinnitus, ear pain, hearing loss. RESPIRATORY: Denies cough, wheeze, shortness of breath.  CARDIOVASCULAR: Denies chest pain, palpitations, edema.  GASTROINTESTINAL: Denies nausea, vomiting, diarrhea, abdominal pain. Denies bright red blood per rectum. GENITOURINARY: Denies dysuria, hematuria. ENDOCRINE: Denies nocturia or thyroid problems. HEMATOLOGIC AND LYMPHATIC: Denies easy bruising or bleeding. SKIN: Denies rash or lesion. MUSCULOSKELETAL: Denies pain in neck, back, shoulder, knees, hips or arthritic symptoms.  NEUROLOGIC: Denies paralysis, paresthesias.  PSYCHIATRIC: Denies anxiety or depressive symptoms.   VITAL SIGNS:  Blood pressure 125/72, pulse 65, temperature 97.6 F (36.4 C), temperature source Oral, resp. rate 16, height 5\' 11"  (1.803 m), weight 102.513 kg (226 lb), SpO2 94 %.  I/O:  No intake or output data in the 24 hours ending 05/08/16 1139  PHYSICAL EXAMINATION:  GENERAL:  60 y.o.-year-old patient lying in the bed with no acute distress.  EYES: Pupils equal, round, reactive to light and accommodation. No scleral icterus. Extraocular muscles intact.  HEENT: Head atraumatic, normocephalic. Oropharynx and nasopharynx clear.  NECK:  Supple, no jugular venous distention. No thyroid enlargement, no tenderness.  LUNGS: Normal breath sounds bilaterally, no wheezing, rales,rhonchi or crepitation. No use of accessory muscles of respiration.  CARDIOVASCULAR: S1, S2 normal. No murmurs, rubs, or gallops.  ABDOMEN: Soft, non-tender, non-distended. Bowel sounds present. No organomegaly or mass.  EXTREMITIES: No pedal edema, cyanosis, or clubbing.  NEUROLOGIC: Cranial nerves II through XII are intact. Muscle strength 5/5 in all extremities. Sensation intact. Gait not  checked.  PSYCHIATRIC: The patient is alert and oriented x 3.  SKIN: No obvious rash, lesion, or ulcer.   DATA REVIEW:   CBC  Recent Labs Lab 05/08/16 0249  WBC 15.0*  HGB 13.7  HCT 40.2  PLT 194    Chemistries   Recent Labs Lab 05/08/16 0249  NA 132*  K 3.4*  CL 94*  CO2 29  GLUCOSE 113*  BUN 21*  CREATININE 1.12  CALCIUM 9.1    Cardiac Enzymes  Recent Labs Lab 05/08/16 1018  TROPONINI 0.13*    Microbiology Results  Results for orders placed or performed during the hospital encounter of 02/29/16  Urine culture     Status: Abnormal   Collection Time: 02/29/16  6:40 PM  Result Value Ref Range Status   Specimen Description URINE, CLEAN CATCH  Final   Special Requests NONE  Final   Culture (A)  Final    10,000 COLONIES/mL DIPHTHEROIDS(CORYNEBACTERIUM SPECIES) Standardized susceptibility testing for this organism is not available.    Report Status 03/02/2016 FINAL  Final    RADIOLOGY:  Dg Chest 2 View  05/08/2016  CLINICAL DATA:  Increasing shortness of breath since Monday. Exposure to dust. EXAM: CHEST  2 VIEW COMPARISON:  11/12/2014 FINDINGS: Postoperative changes in the mediastinum. Hyperinflation. Normal heart size and pulmonary vascularity. No focal airspace disease or consolidation in the lungs. No blunting of costophrenic angles. No pneumothorax. Mediastinal contours appear intact. Postoperative changes in the lumbar spine. IMPRESSION: Pulmonary hyperinflation.  No evidence of active pulmonary disease. Electronically Signed   By: Lucienne Capers M.D.   On: 05/08/2016 03:03    EKG:   Orders placed or performed during the hospital encounter of 05/08/16  . EKG 12-Lead  . EKG 12-Lead  . ED EKG  . ED EKG      Management plans discussed with the patient, he is  in agreement.  CODE STATUS:     Code Status Orders        Start     Ordered   05/08/16 1001  Full code   Continuous     05/08/16 1001    Code Status History    Date Active Date  Inactive Code Status Order ID Comments User Context   10/15/2014  1:13 PM 10/20/2014  4:49 PM Full Code [id number]  Elam Dutch, MD Inpatient   09/12/2014  9:29 AM 09/12/2014  4:09 PM Full Code [id number]  Josue Hector, MD Inpatient   12/18/2013  2:29 PM 12/26/2013  2:29 PM Full Code [id number]  Charlie Pitter, MD Inpatient   09/29/2012  9:51 AM 09/29/2012  5:27 PM Full Code [id number]  Alvina Chou, PA-C ED   09/29/2012  8:20 AM 09/29/2012  9:51 AM Full Code [id number]  Carlisle Cater, PA ED      TOTAL TIME TAKING CARE OF THIS PATIENT: 43  minutes.   Note: This dictation was prepared with Dragon dictation along with smaller phrase technology. Any transcriptional errors that result from this process are unintentional.   @[facility] @  on 05/08/2016 at 11:39 AM  Between 7am to 6pm - Pager - [phone]  After 6pm go to [email or web address] - password [email or web address]  Office  [phone]  CC: Primary care physician; Curly Rim, MD

## 2016-05-08 NOTE — ED Notes (Signed)
Went into pt's room. Pt took off heart monitor leads and oxygen.  Sats are 96% on room air while awake.  Pt agreeable to plan to stay in the hospital. Waiting for bed placement.

## 2016-05-08 NOTE — Discharge Instructions (Signed)
Activity as tolerated Cardiac diet Follow-up with primary care physician in a week Follow-up with cardiology Dr. Humphrey Rolls on Tuesday, June 20 at 1 PM for outpatient stress test and echocardiogram

## 2016-05-08 NOTE — H&P (Signed)
Bradley Sawyer is an 60 y.o. male.   Chief Complaint: Shortness of breath HPI: The patient with past medical history of COPD and heart disease presents emergency department complaining of shortness of breath. He states that it began after his son was sanding some wood in his home which produced a lot of dust. He has been coughing and complaining of some sputum production. He states that shortness of breath worsened significantly last night. The patient received multiple breathing treatments and Solu-Medrol in the emergency department. However, laboratory results showed elevated troponin. The patient denies any chest pain, nausea, vomiting or diaphoresis. Due to his cardiovascular risk factors emergency department staff called for admission.  Past Medical History  Diagnosis Date  . Hyperlipidemia   . Hypertension   . GERD (gastroesophageal reflux disease)   . Tobacco abuse   . COPD (chronic obstructive pulmonary disease) (HCC)     bronchitis  . Disorder of vocal cord   . Cocaine dependence     Last use 1998, attends 3 NA meetings weekly.  . Right shoulder pain     2/2 partial rotator cuff tear, tendinopathy, mild subacromial/subdeltioid bursitis, A/C joint arthropathy per MRI done in 3/07  . Arthritis   . Anxiety   . Compression fracture     L2  . CVA (cerebral infarction)   . Fatty liver   . Hx of cardiovascular stress test     ETT/Lexiscan-Myoview (10/15):  Inferior, inferoapical, apical lateral defect c/w mild ischemia, EF 66%; Intermediate Risk  . 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 Neck surgery    . Lumbar spine surgery  12/18/2013    L2     DR POOL  . Anterior lat lumbar fusion N/A 12/18/2013    Procedure: Lumbar Two Anteriorlateral Corpectomy w/ cage, interbody fusion, anterior plating, Lumbar One to Lumbar Three percutaneous pedicle screws;  Surgeon: Charlie Pitter, MD;  Location: [place] [facility];   Service: Neurosurgery;  Laterality: N/A;  . Tee without cardioversion N/A 12/25/2013    Procedure: TRANSESOPHAGEAL ECHOCARDIOGRAM (TEE);  Surgeon: Larey Dresser, MD;  Location: [facility] ENDOSCOPY;  Service: Cardiovascular;  Laterality: N/A;  . Arch aortogram, left carotid,left subclavian angiogram  08-23-2014  . Aorta -innomiate bypass N/A 10/15/2014    Procedure: ENDARTERCTOMY OF  INNOMINATE ARTERY ;  Surgeon: Elam Dutch, MD;  Location: [place];  Service: Vascular;  Laterality: N/A;  . Patch angioplasty Right 10/15/2014    Procedure: PATCH ANGIOPLASTY OF RIGHT SUBCLAVIAN ARTERY , RIGHT COMMON CAROTID ARTERY & INNOMINATE ARTERY;  Surgeon: Elam Dutch, MD;  Location: [place];  Service: Vascular;  Laterality: Right;  . Endarterectomy Right 10/15/2014    Procedure: ENDARTERECTOMY RIGHT SUBCLAVIAN ARTERY;  Surgeon: Elam Dutch, MD;  Location: [place];  Service: Vascular;  Laterality: Right;  . Endarterectomy Right 10/15/2014    Procedure: ENDARTERECTOMY RIGHT COMMON CAROTID ARTERY;  Surgeon: Elam Dutch, MD;  Location: [place];  Service: Vascular;  Laterality: Right;  . Sternotomy N/A 10/15/2014    Procedure: PARTIAL STERNOTOMY & PLATING OF STERNUM;  Surgeon: Elam Dutch, MD;  Location: [place];  Service: Vascular;  Laterality: N/A;  . Arch aortogram N/A 08/23/2014    Procedure: ARCH AORTOGRAM;  Surgeon: Elam Dutch, MD;  Location: [facility] CATH LAB;  Service: Cardiovascular;  Laterality: N/A;  . Carotid angiogram Left 08/23/2014    Procedure: CAROTID ANGIOGRAM;  Surgeon: Elam Dutch, MD;  Location:  [place] CATH LAB;  Service: Cardiovascular;  Laterality: Left;  . Left heart catheterization with coronary angiogram N/A 09/12/2014    Procedure: LEFT HEART CATHETERIZATION WITH CORONARY ANGIOGRAM;  Surgeon: Josue Hector, MD;  Location: [facility] CATH LAB;  Service: Cardiovascular;  Laterality: N/A;    Family History  Problem Relation Age of Onset  . Diabetes Paternal Grandmother     1 st degree  relatives  . Heart disease Father   . Diabetes Brother   . Heart disease Brother   . Muscular dystrophy Brother   . Muscular dystrophy Sister   . Heart attack Father   . Hypertension Paternal Grandmother    Social History:  reports that he has been smoking Cigarettes.  He has a 48 pack-year smoking history. He has never used smokeless tobacco. He reports that he does not drink alcohol or use illicit drugs.  Allergies: No Known Allergies  Prior to Admission medications   Medication Sig Start Date End Date Taking? Authorizing Provider  ADVAIR DISKUS 500-50 MCG/DOSE AEPB INHALE 1 PUFF INTO THE LUNGS EVERY 12 (TWELVE) HOURS. 09/08/15  Yes Carly Montey Hora, MD  albuterol (ACCUNEB) 1.25 MG/3ML nebulizer solution Take 3 mLs by nebulization every 6 (six) hours as needed.   Yes Historical Provider, MD  aspirin EC 81 MG tablet Take 81 mg by mouth daily.   Yes Historical Provider, MD  atorvastatin (LIPITOR) 10 MG tablet Take 1 tablet (10 mg total) by mouth daily. 03/14/15  Yes Carly Montey Hora, MD  FLUoxetine (PROZAC) 10 MG capsule Take 1 capsule (10 mg total) by mouth daily. 04/03/15  Yes Carly J Rivet, MD  gabapentin (NEURONTIN) 400 MG capsule TAKE 2 CAPSULES (800 MG TOTAL) BY MOUTH 3 (THREE) TIMES DAILY. 09/08/15  Yes Carly Montey Hora, MD  lisinopril-hydrochlorothiazide (PRINZIDE,ZESTORETIC) 20-12.5 MG per tablet TAKE 1 TABLET BY MOUTH DAILY. 07/08/15  Yes Carly Montey Hora, MD  meloxicam (MOBIC) 15 MG tablet TAKE 1 TABLET BY MOUTH EVERY DAY 09/24/15  Yes Carly Montey Hora, MD  metoprolol (LOPRESSOR) 100 MG tablet Take 100 mg by mouth 2 (two) times daily.   Yes Historical Provider, MD  omega-3 acid ethyl esters (LOVAZA) 1 G capsule TAKE 2 CAPSULES (2 G TOTAL) BY MOUTH 2 (TWO) TIMES DAILY. 07/29/15  Yes Carly Montey Hora, MD  ondansetron (ZOFRAN) 4 MG tablet Take 1 tablet (4 mg total) by mouth every 8 (eight) hours as needed for nausea or vomiting. 09/28/15  Yes Lisa Roca, MD  oxyCODONE (ROXICODONE) 15 MG immediate release  tablet Take 1 tablet by mouth 4 (four) times daily. 12/20/15  Yes Historical Provider, MD  pantoprazole (PROTONIX) 40 MG tablet TAKE 1 TABLET (40 MG TOTAL) BY MOUTH DAILY. 07/08/15  Yes Carly Montey Hora, MD  polyethylene glycol (MIRALAX / GLYCOLAX) packet Take 17 g by mouth daily as needed (for constipation).   Yes Historical Provider, MD  PROAIR HFA 108 (90 BASE) MCG/ACT inhaler INHALE 2 PUFFS INTO THE LUNGS EVERY 6 (SIX) HOURS AS NEEDED FOR WHEEZING. 05/01/15  Yes Carly J Rivet, MD  rOPINIRole (REQUIP) 1 MG tablet Take 1 mg by mouth at bedtime.   Yes Historical Provider, MD  tiotropium (SPIRIVA) 18 MCG inhalation capsule Place 18 mcg into inhaler and inhale daily.   Yes Historical Provider, MD  promethazine (PHENERGAN) 12.5 MG tablet Take 2 tablets (25 mg total) by mouth every 6 (six) hours as needed for nausea or vomiting. 09/28/15   Lisa Roca, MD     Results for orders placed  or performed during the hospital encounter of 05/08/16 (from the past 48 hour(s))  CBC     Status: Abnormal   Collection Time: 05/08/16  2:49 AM  Result Value Ref Range   WBC 15.0 (H) 3.8 - 10.6 K/uL   RBC 4.48 4.40 - 5.90 MIL/uL   Hemoglobin 13.7 13.0 - 18.0 g/dL   HCT 40.2 40.0 - 52.0 %   MCV 89.7 80.0 - 100.0 fL   MCH 30.6 26.0 - 34.0 pg   MCHC 34.1 32.0 - 36.0 g/dL   RDW 13.5 11.5 - 14.5 %   Platelets 194 150 - 440 K/uL  Basic metabolic panel     Status: Abnormal   Collection Time: 05/08/16  2:49 AM  Result Value Ref Range   Sodium 132 (L) 135 - 145 mmol/L   Potassium 3.4 (L) 3.5 - 5.1 mmol/L   Chloride 94 (L) 101 - 111 mmol/L   CO2 29 22 - 32 mmol/L   Glucose, Bld 113 (H) 65 - 99 mg/dL   BUN 21 (H) 6 - 20 mg/dL   Creatinine, Ser 1.12 0.61 - 1.24 mg/dL   Calcium 9.1 8.9 - 10.3 mg/dL   GFR calc non Af Amer >60 >60 mL/min   GFR calc Af Amer >60 >60 mL/min    Comment: (NOTE) The eGFR has been calculated using the CKD EPI equation. This calculation has not been validated in all clinical situations. eGFR's  persistently <60 mL/min signify possible Chronic Kidney Disease.    Anion gap 9 5 - 15  Troponin I     Status: Abnormal   Collection Time: 05/08/16  2:49 AM  Result Value Ref Range   Troponin I 0.16 (H) <0.031 ng/mL    Comment: READ BACK AND VERIFIED WITH NOEL WEBSTER AT 0510 05/08/16.PMH        PERSISTENTLY INCREASED TROPONIN VALUES IN THE RANGE OF 0.04-0.49 ng/mL CAN BE SEEN IN:       -UNSTABLE ANGINA       -CONGESTIVE HEART FAILURE       -MYOCARDITIS       -CHEST TRAUMA       -ARRYHTHMIAS       -LATE PRESENTING MYOCARDIAL INFARCTION       -COPD   CLINICAL FOLLOW-UP RECOMMENDED.    Dg Chest 2 View  05/08/2016  CLINICAL DATA:  Increasing shortness of breath since Monday. Exposure to dust. EXAM: CHEST  2 VIEW COMPARISON:  11/12/2014 FINDINGS: Postoperative changes in the mediastinum. Hyperinflation. Normal heart size and pulmonary vascularity. No focal airspace disease or consolidation in the lungs. No blunting of costophrenic angles. No pneumothorax. Mediastinal contours appear intact. Postoperative changes in the lumbar spine. IMPRESSION: Pulmonary hyperinflation.  No evidence of active pulmonary disease. Electronically Signed   By: Lucienne Capers M.D.   On: 05/08/2016 03:03    Review of Systems  Constitutional: Negative for fever and chills.  HENT: Negative for sore throat and tinnitus.   Eyes: Negative for blurred vision and redness.  Respiratory: Positive for cough, sputum production and shortness of breath.   Cardiovascular: Negative for chest pain, palpitations, orthopnea and PND.  Gastrointestinal: Negative for nausea, vomiting, abdominal pain and diarrhea.  Genitourinary: Negative for dysuria, urgency and frequency.  Musculoskeletal: Negative for myalgias and joint pain.  Skin: Negative for rash.       No lesions  Neurological: Negative for speech change, focal weakness and weakness.  Endo/Heme/Allergies: Does not bruise/bleed easily.       No temperature intolerance  Psychiatric/Behavioral: Negative for depression and suicidal ideas.    Blood pressure 119/64, pulse 59, temperature 99.4 F (37.4 C), resp. rate 16, height '5\' 11"'  (1.803 m), weight 102.513 kg (226 lb), SpO2 95 %. Physical Exam  Vitals reviewed. Constitutional: He is oriented to person, place, and time. He appears well-developed and well-nourished. No distress.  HENT:  Head: Normocephalic and atraumatic.  Mouth/Throat: Oropharynx is clear and moist.  Eyes: Conjunctivae and EOM are normal. Pupils are equal, round, and reactive to light. No scleral icterus.  Neck: Normal range of motion. Neck supple. No JVD present. No tracheal deviation present. No thyromegaly present.  Cardiovascular: Normal rate, regular rhythm and normal heart sounds.  Exam reveals no gallop and no friction rub.   No murmur heard. Respiratory: Effort normal and breath sounds normal. No respiratory distress. He has no wheezes.  GI: Soft. Bowel sounds are normal. He exhibits no distension. There is no tenderness.  Genitourinary:  Deferred  Musculoskeletal: Normal range of motion. He exhibits no edema.  Lymphadenopathy:    He has no cervical adenopathy.  Neurological: He is alert and oriented to person, place, and time. No cranial nerve deficit.  Skin: Skin is warm and dry. No rash noted. No erythema.  Psychiatric: He has a normal mood and affect. His behavior is normal. Judgment and thought content normal.     Assessment/Plan This is a 60 year old male admitted for elevated troponin. 1. Elevated troponin: Likely secondary to demand ischemia from increased respiratory effort. The patient is breathing much more comfortably at this time. He is not hypoxic. Continue to follow cardiac enzymes. Monitor telemetry. Cardiology consult placed. 2. COPD exacerbation: Increased sputum production and hypoxia upon presentation. Resolved with multiple breathing treatments and Solu-Medrol. We will continue steroid taper. Azithromycin  for anti-inflammatory effect. Continue inhaled corticosteroid and Spiriva 3. Essential hypertension: Controlled; continue lisinopril with hydrochlorothiazide as well as metoprolol 4. CAD: Continue aspirin 5. Hyperlipidemia: Continue statin therapy 6. Chronic pain: Secondary to arthritis. Continue narcotics per home regimen. Hold meloxicam due to concern for CAD. 7. DVT prophylaxis: Lovenox 8. GI prophylaxis: Pantoprazole per home regimen The patient is full code. Time spent on admission orders and patient care approximately 45 minutes  Harrie Foreman, MD 05/08/2016, 7:33 AM

## 2016-05-08 NOTE — ED Notes (Signed)
CRITICAL TROPONIN: 0.16, [place]

## 2016-05-08 NOTE — ED Notes (Signed)
Cardiac monitoring applied.

## 2016-05-08 NOTE — ED Provider Notes (Signed)
[facility] Emergency Department Provider Note   ____________________________________________  Time seen: Approximately 4:24 AM  I have reviewed the triage vital signs and the nursing notes.   HISTORY  Chief Complaint Shortness of Breath    [name] is a 60 y.o. male who comes in to the hospital today with shortness of breath for the past 3-4 days. The patient reports this started after he washes sun sand the hood of the car. He reports he was wearing a mask but he does not think it was good enough. The patient reports that he thinks some of the dust got into his lungs. The patient does have a history of COPD. He's had an increase of his cough with some clear sputum. He's had some decreased by mouth intake. He reports that his shortness of breath is worse on exertion. The patient has been using his albuterol inhaler every 4 hours and his Advair twice a day but reports it only helps a little bit. The patient was unsure what was going on so he decided to come in for evaluation. He denies any chest pain, denies any nausea or vomiting, denies any lightheadedness or dizziness.   Past Medical History  Diagnosis Date  . Hyperlipidemia   . Hypertension   . GERD (gastroesophageal reflux disease)   . Tobacco abuse   . COPD (chronic obstructive pulmonary disease) (HCC)     bronchitis  . Disorder of vocal cord   . Cocaine dependence     Last use 1998, attends 3 NA meetings weekly.  . Right shoulder pain     2/2 partial rotator cuff tear, tendinopathy, mild subacromial/subdeltioid bursitis, A/C joint arthropathy per MRI done in 3/07  . Arthritis   . Anxiety   . Compression fracture     L2  . CVA (cerebral infarction)   . Fatty liver   . Hx of cardiovascular stress test     ETT/Lexiscan-Myoview (10/15):  Inferior, inferoapical, apical lateral defect c/w mild ischemia, EF 66%; Intermediate Risk  . 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 Elevated troponin 05/08/2016  . Acute viral pharyngitis 03/20/2015  . Olecranon bursitis of left elbow 12/23/2014  . Embolic stroke ([place]) [id number]  . Carotid stenosis 10/15/2014  . 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 PAD (peripheral artery disease) ([place]) 08/23/2014  . Preventative health care 08/12/2014  . Occlusion and stenosis of carotid artery without mention of cerebral infarction 07/25/2014  . Subclavian steal syndrome 07/25/2014  . CAD (coronary artery disease) 07/10/2014  . CVA (cerebral infarction) 04/10/2014  . 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 Compression fracture of L2, with back and right groin pain 07/18/2013  . History of colon polyps 05/18/2012  . COPD (chronic obstructive pulmonary disease) ([place]) 12/24/2009  . Hyperlipemia 09/20/2006  . TOBACCO ABUSE 09/20/2006  . Essential hypertension 09/20/2006  . GERD 09/20/2006    Past Surgical History  Procedure Laterality Date  . Neck surgery    . Lumbar spine surgery  12/18/2013    L2     DR POOL  . Anterior lat lumbar fusion N/A 12/18/2013    Procedure: Lumbar Two Anteriorlateral Corpectomy w/ cage, interbody fusion, anterior plating, Lumbar One to Lumbar Three percutaneous pedicle screws;  Surgeon: Charlie Pitter, MD;  Location: [facility] NEURO ORS;  Service: Neurosurgery;  Laterality: N/A;  .  Tee without cardioversion N/A 12/25/2013    Procedure: TRANSESOPHAGEAL ECHOCARDIOGRAM (TEE);  Surgeon: Larey Dresser, MD;  Location: [facility] ENDOSCOPY;  Service: Cardiovascular;  Laterality: N/A;  . Arch aortogram, left carotid,left subclavian angiogram  08-23-2014  . Aorta -innomiate bypass N/A 10/15/2014    Procedure: ENDARTERCTOMY OF  INNOMINATE ARTERY ;  Surgeon: Elam Dutch, MD;  Location: [place];  Service: Vascular;  Laterality: N/A;  . Patch angioplasty Right  10/15/2014    Procedure: PATCH ANGIOPLASTY OF RIGHT SUBCLAVIAN ARTERY , RIGHT COMMON CAROTID ARTERY & INNOMINATE ARTERY;  Surgeon: Elam Dutch, MD;  Location: [place];  Service: Vascular;  Laterality: Right;  . Endarterectomy Right 10/15/2014    Procedure: ENDARTERECTOMY RIGHT SUBCLAVIAN ARTERY;  Surgeon: Elam Dutch, MD;  Location: [place];  Service: Vascular;  Laterality: Right;  . Endarterectomy Right 10/15/2014    Procedure: ENDARTERECTOMY RIGHT COMMON CAROTID ARTERY;  Surgeon: Elam Dutch, MD;  Location: [place];  Service: Vascular;  Laterality: Right;  . Sternotomy N/A 10/15/2014    Procedure: PARTIAL STERNOTOMY & PLATING OF STERNUM;  Surgeon: Elam Dutch, MD;  Location: [place];  Service: Vascular;  Laterality: N/A;  . Arch aortogram N/A 08/23/2014    Procedure: ARCH AORTOGRAM;  Surgeon: Elam Dutch, MD;  Location: [facility] CATH LAB;  Service: Cardiovascular;  Laterality: N/A;  . Carotid angiogram Left 08/23/2014    Procedure: CAROTID ANGIOGRAM;  Surgeon: Elam Dutch, MD;  Location: [facility] CATH LAB;  Service: Cardiovascular;  Laterality: Left;  . Left heart catheterization with coronary angiogram N/A 09/12/2014    Procedure: LEFT HEART CATHETERIZATION WITH CORONARY ANGIOGRAM;  Surgeon: Josue Hector, MD;  Location: [facility] CATH LAB;  Service: Cardiovascular;  Laterality: N/A;    Current Outpatient Rx  Name  Route  Sig  Dispense  Refill  . ADVAIR DISKUS 500-50 MCG/DOSE AEPB      INHALE 1 PUFF INTO THE LUNGS EVERY 12 (TWELVE) HOURS.   180 each   3   . albuterol (ACCUNEB) 1.25 MG/3ML nebulizer solution   Nebulization   Take 3 mLs by nebulization every 6 (six) hours as needed.         Marland Kitchen aspirin EC 81 MG tablet   Oral   Take 81 mg by mouth daily.         Marland Kitchen atorvastatin (LIPITOR) 10 MG tablet   Oral   Take 1 tablet (10 mg total) by mouth daily.   30 tablet   11   . FLUoxetine (PROZAC) 10 MG capsule   Oral   Take 1 capsule (10 mg total) by mouth daily.   90  capsule   3   . gabapentin (NEURONTIN) 400 MG capsule      TAKE 2 CAPSULES (800 MG TOTAL) BY MOUTH 3 (THREE) TIMES DAILY.   180 capsule   3   . lisinopril-hydrochlorothiazide (PRINZIDE,ZESTORETIC) 20-12.5 MG per tablet      TAKE 1 TABLET BY MOUTH DAILY.   30 tablet   6   . meloxicam (MOBIC) 15 MG tablet      TAKE 1 TABLET BY MOUTH EVERY DAY   30 tablet   3   . metoprolol (LOPRESSOR) 100 MG tablet   Oral   Take 100 mg by mouth 2 (two) times daily.         Marland Kitchen omega-3 acid ethyl esters (LOVAZA) 1 G capsule      TAKE 2 CAPSULES (2 G TOTAL) BY MOUTH 2 (TWO) TIMES DAILY.  90 capsule   3   . ondansetron (ZOFRAN) 4 MG tablet   Oral   Take 1 tablet (4 mg total) by mouth every 8 (eight) hours as needed for nausea or vomiting.   10 tablet   0   . oxyCODONE (ROXICODONE) 15 MG immediate release tablet   Oral   Take 1 tablet by mouth 4 (four) times daily.         . pantoprazole (PROTONIX) 40 MG tablet      TAKE 1 TABLET (40 MG TOTAL) BY MOUTH DAILY.   30 tablet   6   . polyethylene glycol (MIRALAX / GLYCOLAX) packet   Oral   Take 17 g by mouth daily as needed (for constipation).         Marland Kitchen PROAIR HFA 108 (90 BASE) MCG/ACT inhaler      INHALE 2 PUFFS INTO THE LUNGS EVERY 6 (SIX) HOURS AS NEEDED FOR WHEEZING.   22.5 Inhaler   1   . rOPINIRole (REQUIP) 1 MG tablet   Oral   Take 1 mg by mouth at bedtime.         Marland Kitchen tiotropium (SPIRIVA) 18 MCG inhalation capsule   Inhalation   Place 18 mcg into inhaler and inhale daily.         . promethazine (PHENERGAN) 12.5 MG tablet   Oral   Take 2 tablets (25 mg total) by mouth every 6 (six) hours as needed for nausea or vomiting.   10 tablet   0     Allergies Review of patient's allergies indicates no known allergies.  Family History  Problem Relation Age of Onset  . Diabetes Paternal Grandmother     1 st degree relatives  . Heart disease Father   . Diabetes Brother   . Heart disease Brother   . Muscular  dystrophy Brother   . Muscular dystrophy Sister   . Heart attack Father   . Hypertension Paternal Grandmother     Social History Social History  Substance Use Topics  . Smoking status: Current Every Day Smoker -- 1.00 packs/day for 48 years    Types: Cigarettes    Last Attempt to Quit: 09/23/2014  . Smokeless tobacco: Never Used  . Alcohol Use: No    Review of Systems Constitutional: No fever/chills Eyes: No visual changes. ENT: No sore throat. Cardiovascular: Denies chest pain. Respiratory: Cough and shortness of breath. Gastrointestinal: No abdominal pain.  No nausea, no vomiting.  No diarrhea.  No constipation. Genitourinary: Negative for dysuria. Musculoskeletal: Negative for back pain. Skin: Negative for rash. Neurological: Negative for headaches, focal weakness or numbness.  10-point ROS otherwise negative.  ____________________________________________   PHYSICAL EXAM:  VITAL SIGNS: ED Triage Vitals  Enc Vitals Group     BP 05/08/16 0235 144/125 mmHg     Pulse Rate 05/08/16 0235 91     Resp 05/08/16 0235 20     Temp 05/08/16 0235 99.4 F (37.4 C)     Temp src --      SpO2 05/08/16 0235 93 %     Weight 05/08/16 0235 226 lb (102.513 kg)     Height 05/08/16 0235 5\' 11"  (1.803 m)     Head Cir --      Peak Flow --      Pain Score 05/08/16 0236 9     Pain Loc --      Pain Edu? --      Excl. in [place]? --     Constitutional:  Alert and oriented. Well appearing and in Moderate distress. Eyes: Conjunctivae are normal. PERRL. EOMI. Head: Atraumatic. Nose: No congestion/rhinnorhea. Mouth/Throat: Mucous membranes are moist.  Oropharynx non-erythematous. Cardiovascular: Normal rate, regular rhythm. Grossly normal heart sounds.  Good peripheral circulation. Respiratory: Normal respiratory effort.  No retractions. Expiratory wheezes in all lung fields Gastrointestinal: Soft and nontender. No distention. Positive bowel sounds Musculoskeletal: No lower extremity  tenderness nor edema.   Neurologic:  Normal speech and language. Skin:  Skin is warm, dry and intact.  Psychiatric: Mood and affect are normal.   ____________________________________________   LABS (all labs ordered are listed, but only abnormal results are displayed)  Labs Reviewed  CBC - Abnormal; Notable for the following:    WBC 15.0 (*)    All other components within normal limits  BASIC METABOLIC PANEL - Abnormal; Notable for the following:    Sodium 132 (*)    Potassium 3.4 (*)    Chloride 94 (*)    Glucose, Bld 113 (*)    BUN 21 (*)    All other components within normal limits  TROPONIN I - Abnormal; Notable for the following:    Troponin I 0.16 (*)    All other components within normal limits   ____________________________________________  EKG  ED ECG REPORT I, Loney Hering, the attending physician, personally viewed and interpreted this ECG.   Date: 05/08/2016  EKG Time: 2335  Rate: 90  Rhythm: normal sinus rhythm  Axis: Normal  Intervals:none  ST&T Change: none  ____________________________________________  RADIOLOGY  Chest x-ray: Pulmonary hyperinflation, no evidence of active pulmonary disease. ____________________________________________   PROCEDURES  Procedure(s) performed: None  Critical Care performed: No  ____________________________________________   INITIAL IMPRESSION / ASSESSMENT AND PLAN / ED COURSE  Pertinent labs & imaging results that were available during my care of the patient were reviewed by me and considered in my medical decision making (see chart for details).  This is a 60 year old male who comes into the hospital today with some shortness of breath. The patient has some significant wheezing and sounds like he may be having a COPD exacerbation. I will give the patient to duo nebs and a dose of Solu-Medrol. He will be reassessed.  The patient's troponin came back elevated at 0.16. I went to the patient that given  the elevation of his troponin I feel that he needs to be admitted to the hospital. The patient did receive a dose of aspirin. He did have some decreased oxygen saturation when he fell asleep and was placed on 2 L. The patient was hesitant to stay in the hospital initially but reports that he will stay to be evaluated for his heart. The patient has been seen by the hospitalist and will be admitted to the hospitalist service. ____________________________________________   FINAL CLINICAL IMPRESSION(S) / ED DIAGNOSES  Final diagnoses:  Wheezing  Elevated troponin      NEW MEDICATIONS STARTED DURING THIS VISIT:  New Prescriptions   No medications on file     Note:  This document was prepared using Dragon voice recognition software and may include unintentional dictation errors.    Loney Hering, MD 05/08/16 [phone]

## 2016-05-08 NOTE — ED Notes (Signed)
Pt reports hx of COPD. Pt reports on this past Monday he was watching his son sand a car and thinks he got some of the dust in his lungs. Pt reports increasing shortness of breath since.

## 2016-05-08 NOTE — ED Notes (Signed)
Nevin Bloodgood, Lab, called for add-on

## 2016-08-09 ENCOUNTER — Other Ambulatory Visit: Payer: Self-pay | Admitting: Internal Medicine

## 2016-08-09 NOTE — Telephone Encounter (Signed)
No visit since 02/2015 and per EMR pt's new pcp [place]

## 2016-08-12 DIAGNOSIS — K4090 Unilateral inguinal hernia, without obstruction or gangrene, not specified as recurrent: Secondary | ICD-10-CM | POA: Insufficient documentation

## 2016-08-22 HISTORY — PX: FETAL SURGERY FOR CONGENITAL HERNIA: SHX1618

## 2016-11-07 IMAGING — DX DG CHEST 2V
2 series · 2 of 2 positions shown · non-contrast
Comparison: 10/16/2014

CLINICAL DATA: Sore chest status post thoracic surgery. Subclavian
arterial stenosis. Left shoulder pain.

EXAM:
CHEST  2 VIEW

[chest lat]
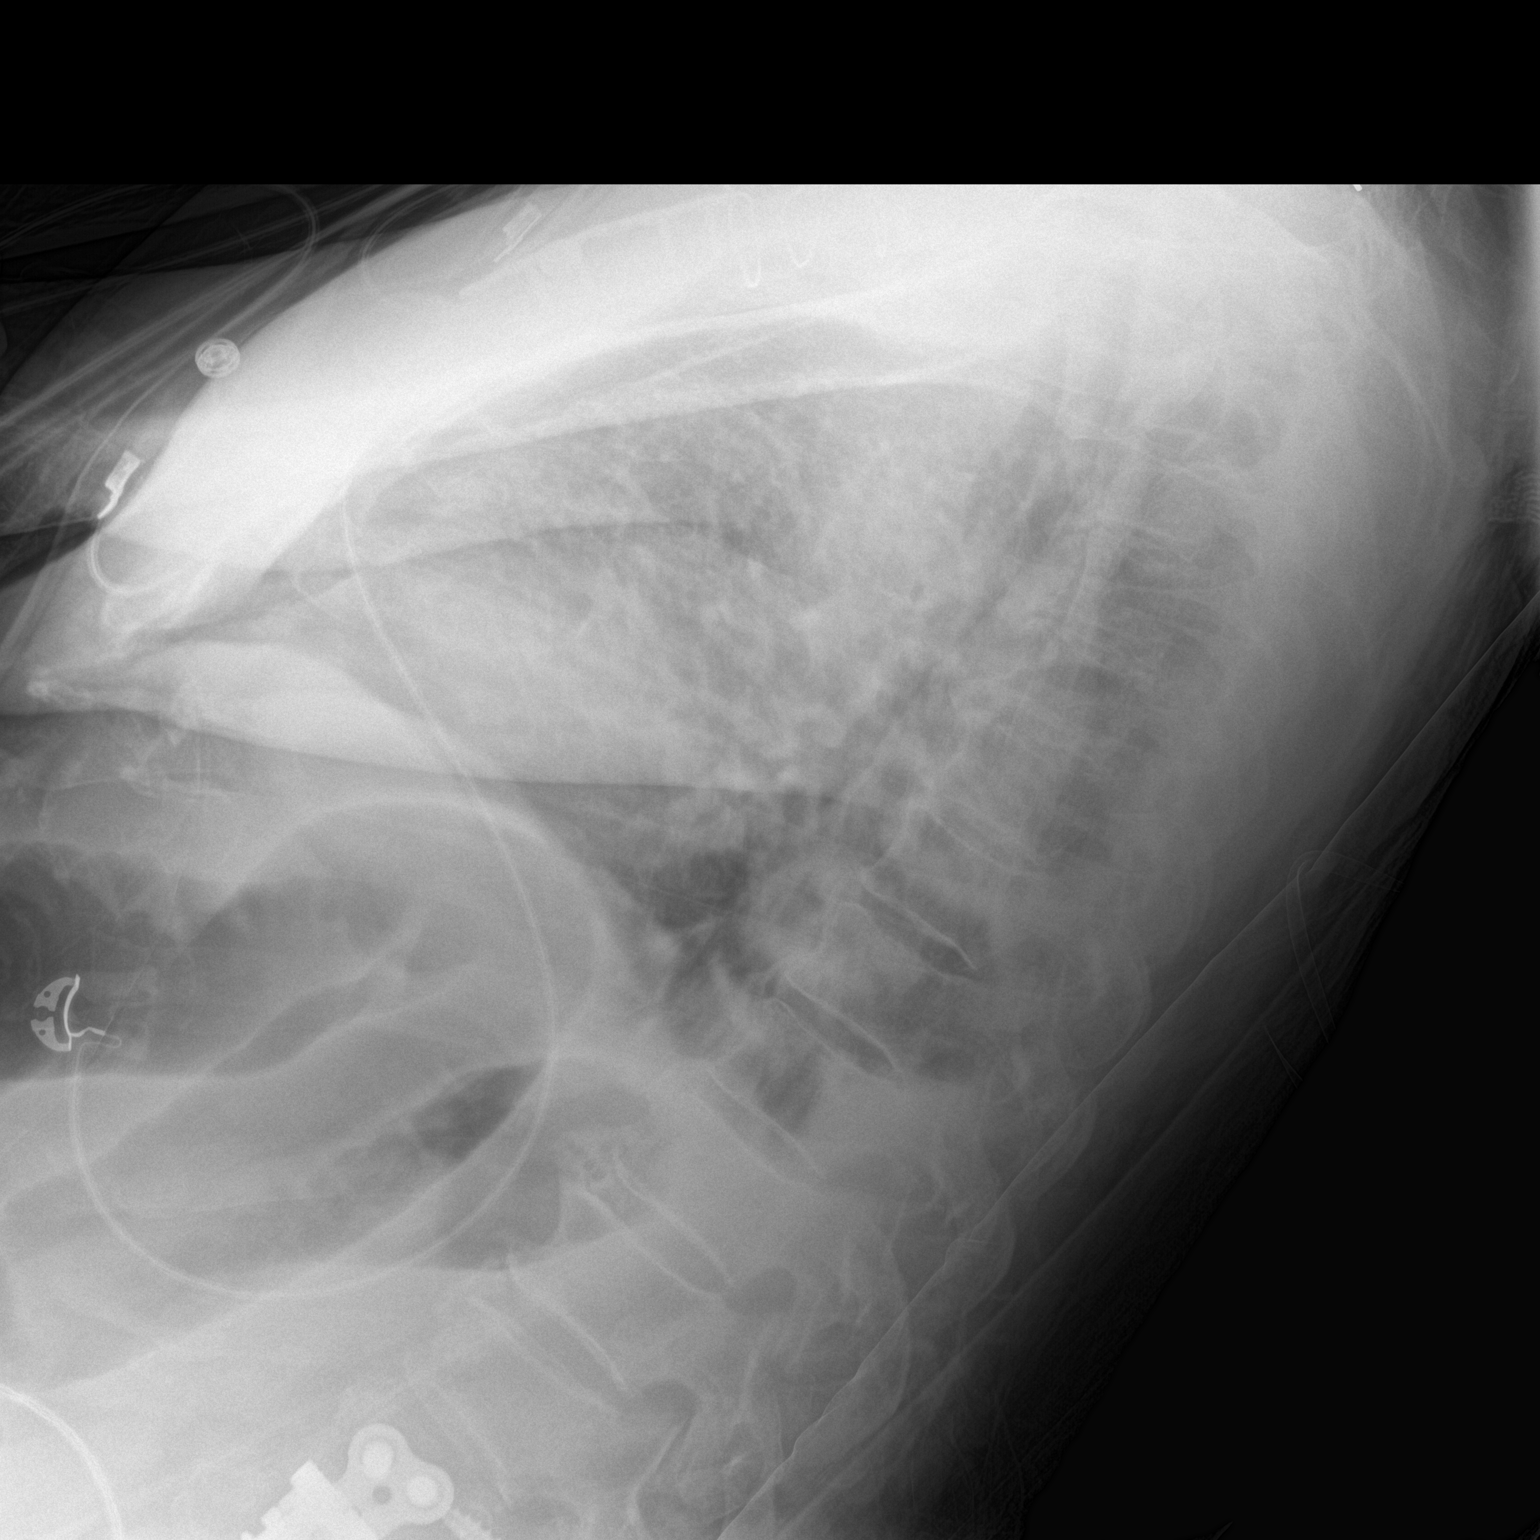

[chest ap]
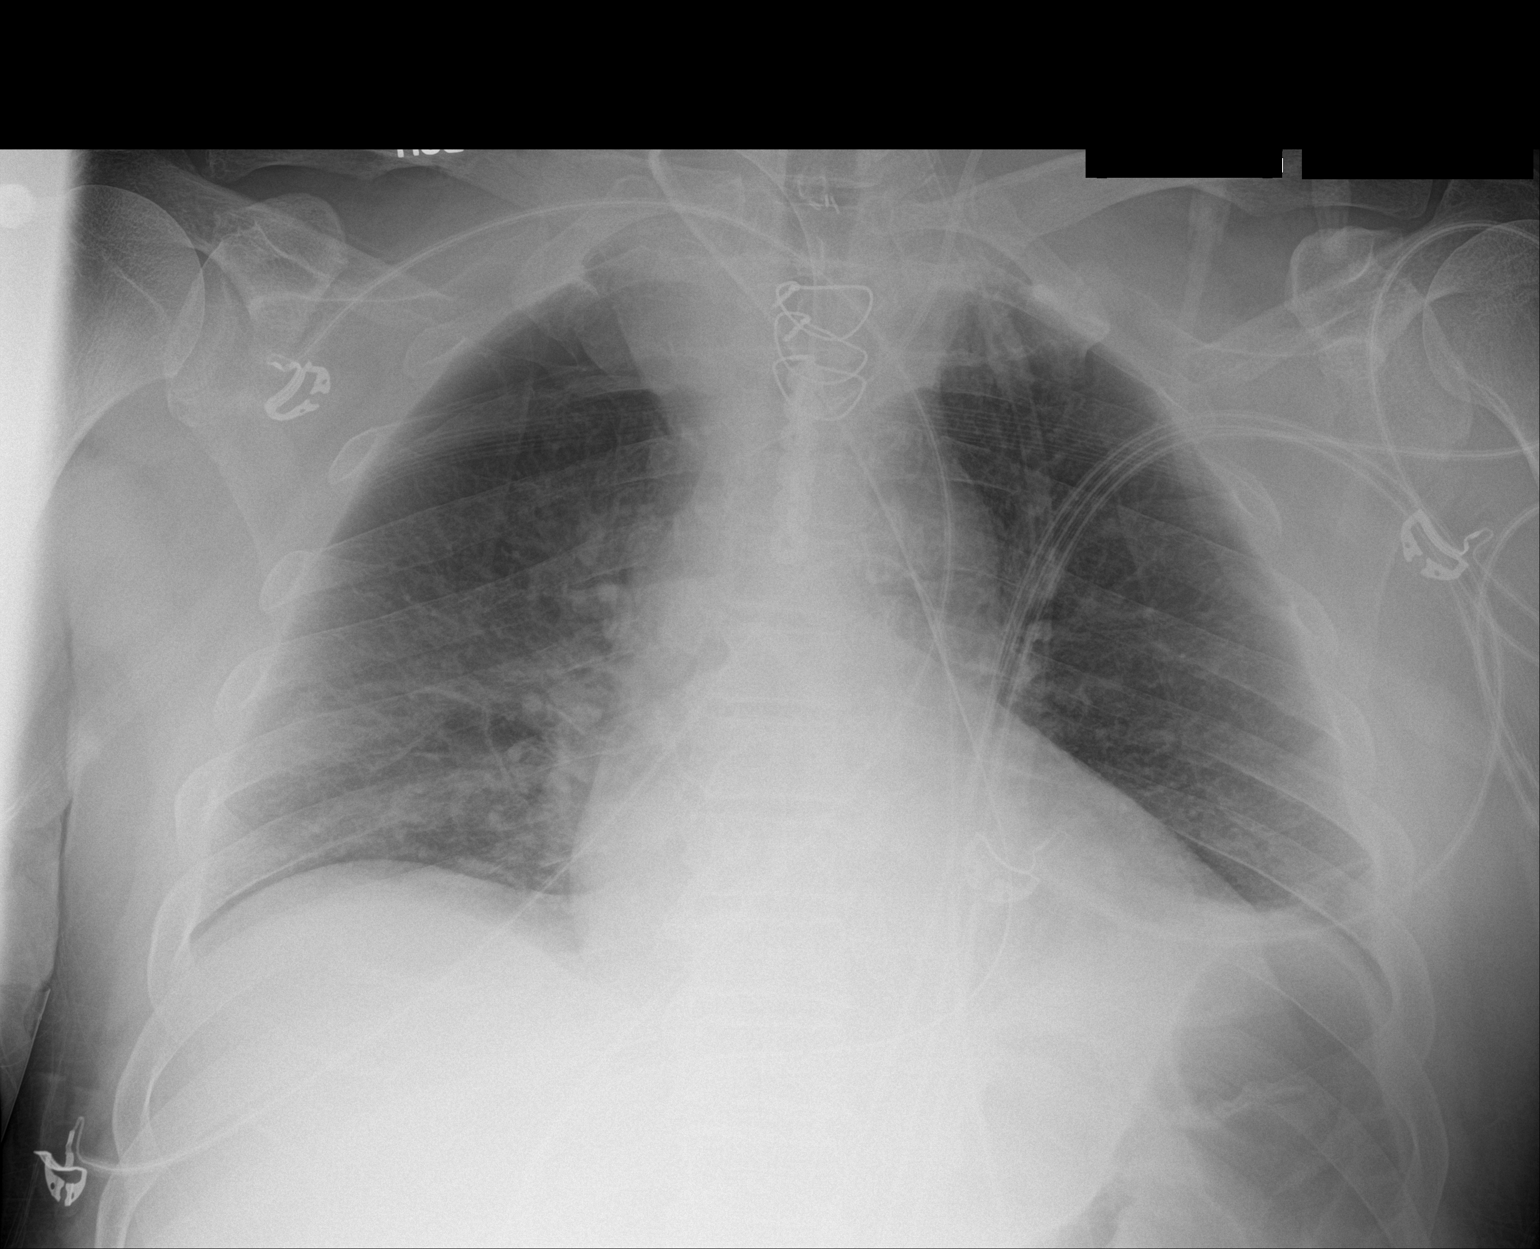

[2 of 2 positions shown; findings below may reference images not displayed]

FINDINGS: Left IJ central line tip overlies the level of superior vena cava.
Surgical drain overlies the thoracic inlet.

There is probable right apical pneumothorax, estimated to be 5-10%
of lung volume. Heart size is enlarged. Minimal left lower lobe
atelectasis. Upper mediastinum appears widened and stable compared
most recent study.
IMPRESSION: 1. Right apical pneumothorax, 5-10% of lung volume.
2. Cardiomegaly.
3. Left lower lobe atelectasis.
4. Lines and tubes as described.
5. Critical Value/emergent results were called by telephone at the
time of interpretation on 10/17/2014 at [DATE] to , the patient's
nurse, Eszterke, who verbally acknowledged these results. She will
follow-up with the patient's team.

## 2016-11-08 IMAGING — DX DG CHEST 2V
2 series · 2 of 2 positions shown · non-contrast
Comparison: PA and lateral chest x-ray October 17, 2014

CLINICAL DATA: Status post open heart surgery

EXAM:
CHEST  2 VIEW

[chest pa]
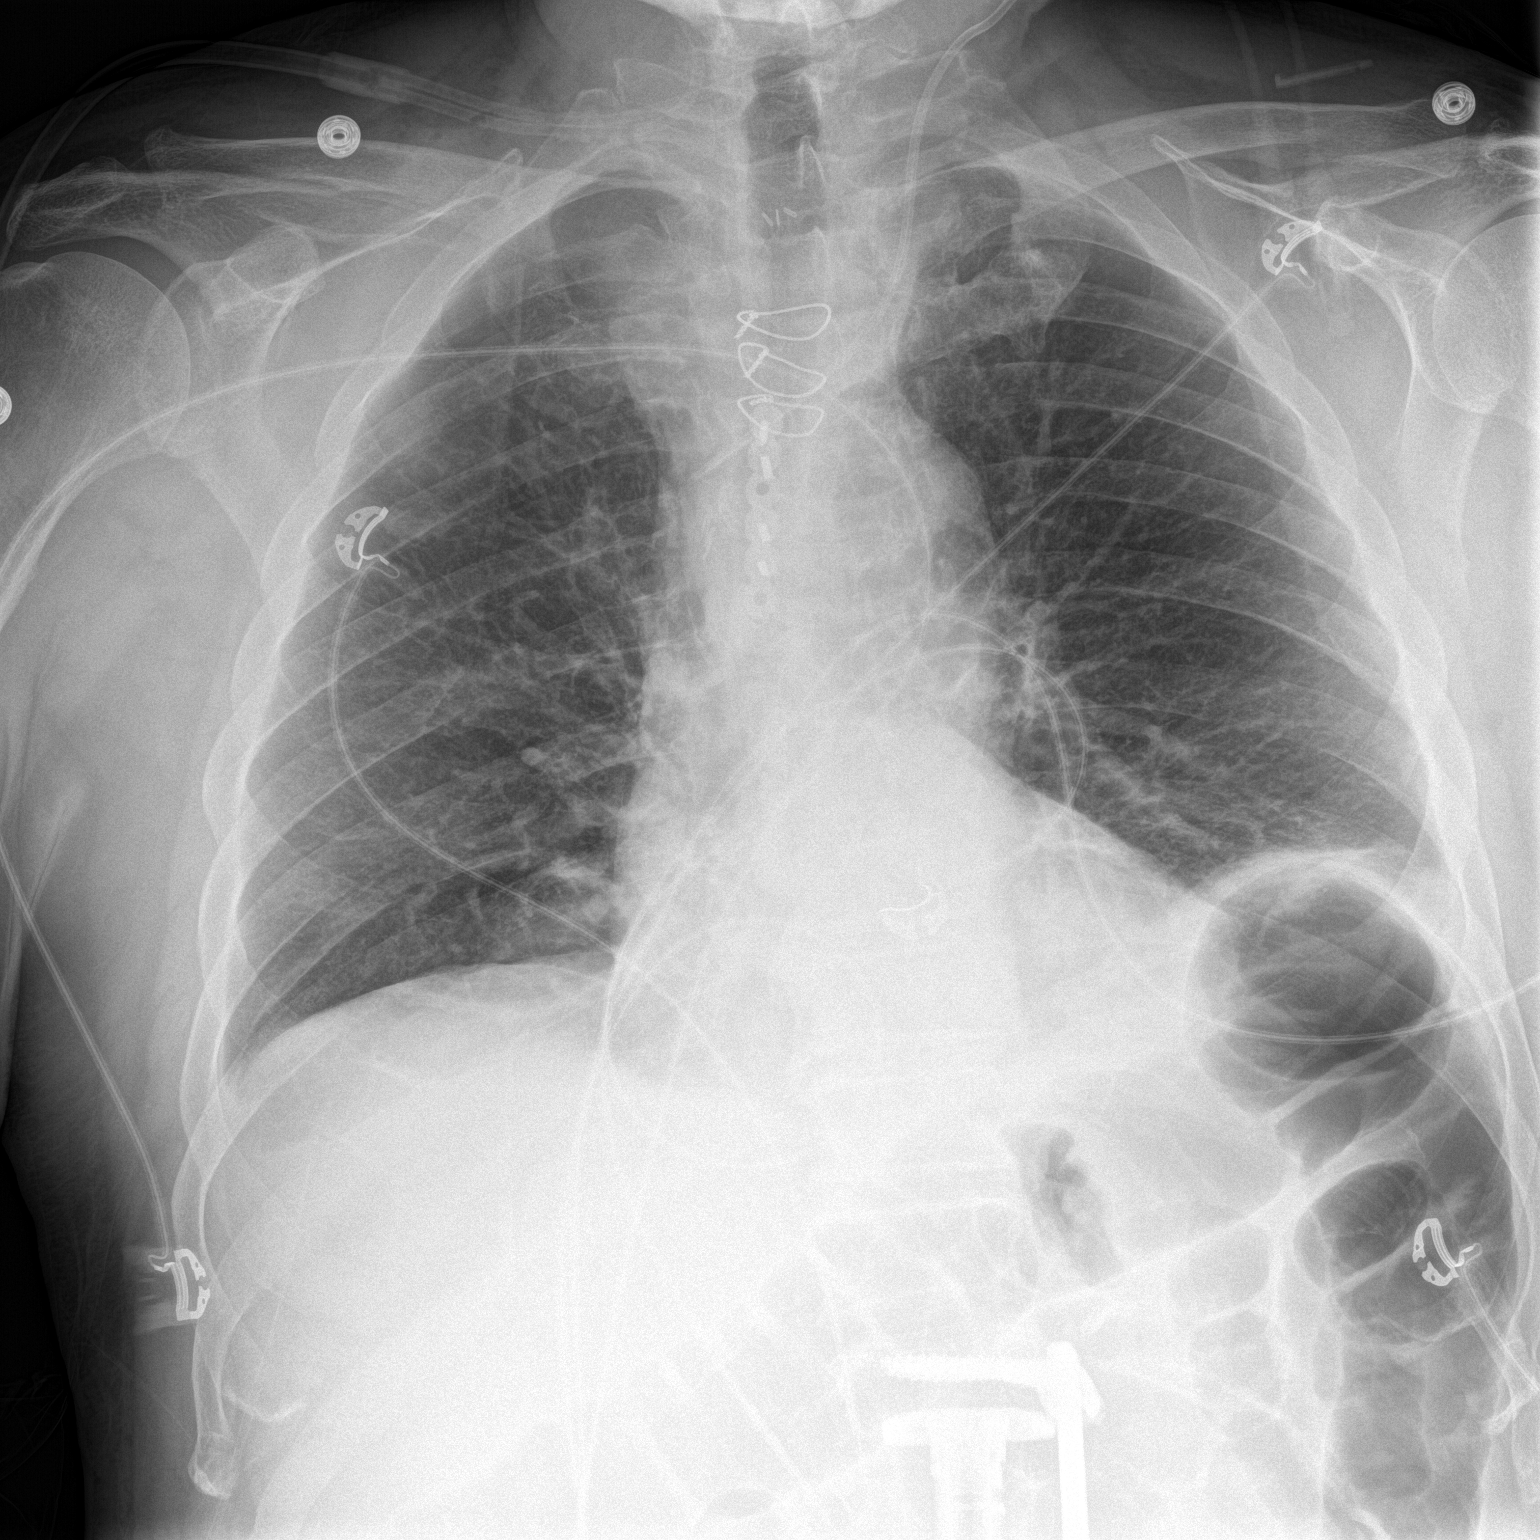

[chest lat]
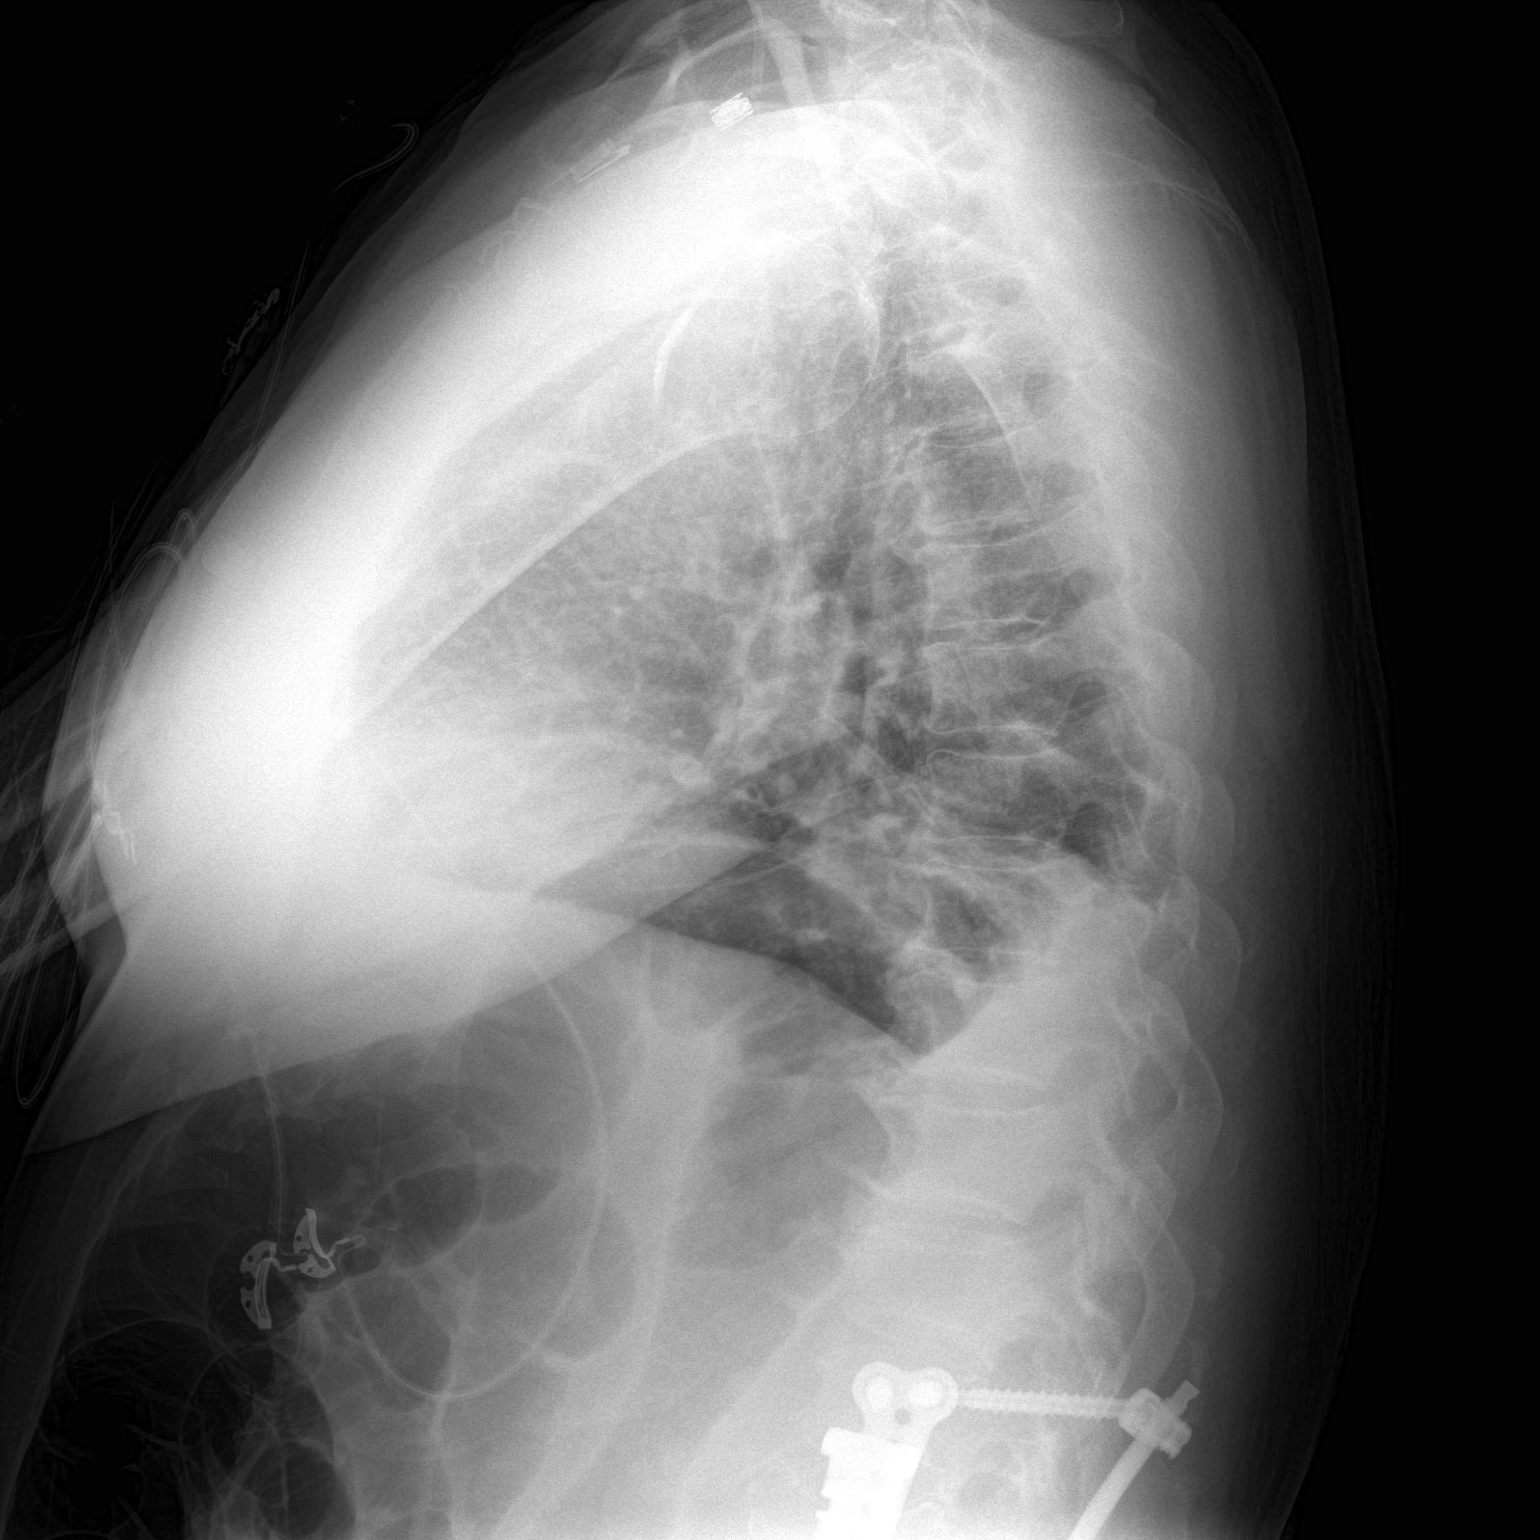

[2 of 2 positions shown; findings below may reference images not displayed]

FINDINGS: The lungs are better inflated today. The small (10% or less) right
apical pneumothorax is stable. There is minimal persistent left
basilar atelectasis. There are persistent small bilateral pleural
effusions layering posteriorly. The cardiac silhouette is top-normal
in size. The pulmonary vascularity is more distinct. The left
internal jugular venous catheter tip projects at the level of the
junction of the right and left brachiocephalic veins.
IMPRESSION: 1. Stable right apical pneumothorax.
2. Improved appearance of the pulmonary interstitium consistent with
resolving interstitial edema.
3. Small stable bilateral pleural effusions layering posteriorly.

## 2016-12-23 HISTORY — PX: CHOLECYSTECTOMY: SHX55

## 2017-02-03 ENCOUNTER — Ambulatory Visit: Payer: Medicaid Other | Admitting: Family

## 2017-02-03 ENCOUNTER — Encounter (HOSPITAL_COMMUNITY): Payer: Medicaid Other

## 2017-02-19 ENCOUNTER — Emergency Department: Payer: Medicaid Other

## 2017-02-19 ENCOUNTER — Inpatient Hospital Stay
Admission: EM | Admit: 2017-02-19 | Discharge: 2017-02-20 | DRG: 728 | Disposition: A | Discharge status: Home / Self Care | Payer: Medicaid Other | Attending: Internal Medicine | Admitting: Internal Medicine

## 2017-02-19 ENCOUNTER — Encounter: Payer: Self-pay | Admitting: Emergency Medicine

## 2017-02-19 DIAGNOSIS — Z981 Arthrodesis status: Secondary | ICD-10-CM

## 2017-02-19 DIAGNOSIS — Z7982 Long term (current) use of aspirin: Secondary | ICD-10-CM

## 2017-02-19 DIAGNOSIS — K4020 Bilateral inguinal hernia, without obstruction or gangrene, not specified as recurrent: Secondary | ICD-10-CM | POA: Diagnosis present

## 2017-02-19 DIAGNOSIS — Z8673 Personal history of transient ischemic attack (TIA), and cerebral infarction without residual deficits: Secondary | ICD-10-CM | POA: Diagnosis not present

## 2017-02-19 DIAGNOSIS — N50812 Left testicular pain: Secondary | ICD-10-CM

## 2017-02-19 DIAGNOSIS — Z716 Tobacco abuse counseling: Secondary | ICD-10-CM

## 2017-02-19 DIAGNOSIS — N390 Urinary tract infection, site not specified: Secondary | ICD-10-CM | POA: Diagnosis present

## 2017-02-19 DIAGNOSIS — G894 Chronic pain syndrome: Secondary | ICD-10-CM | POA: Diagnosis present

## 2017-02-19 DIAGNOSIS — Z8249 Family history of ischemic heart disease and other diseases of the circulatory system: Secondary | ICD-10-CM

## 2017-02-19 DIAGNOSIS — Z79899 Other long term (current) drug therapy: Secondary | ICD-10-CM

## 2017-02-19 DIAGNOSIS — K219 Gastro-esophageal reflux disease without esophagitis: Secondary | ICD-10-CM | POA: Diagnosis present

## 2017-02-19 DIAGNOSIS — I2510 Atherosclerotic heart disease of native coronary artery without angina pectoris: Secondary | ICD-10-CM | POA: Diagnosis present

## 2017-02-19 DIAGNOSIS — N453 Epididymo-orchitis: Principal | ICD-10-CM | POA: Diagnosis present

## 2017-02-19 DIAGNOSIS — I10 Essential (primary) hypertension: Secondary | ICD-10-CM | POA: Diagnosis present

## 2017-02-19 DIAGNOSIS — F329 Major depressive disorder, single episode, unspecified: Secondary | ICD-10-CM | POA: Diagnosis present

## 2017-02-19 DIAGNOSIS — N492 Inflammatory disorders of scrotum: Secondary | ICD-10-CM

## 2017-02-19 DIAGNOSIS — N451 Epididymitis: Secondary | ICD-10-CM

## 2017-02-19 DIAGNOSIS — B9620 Unspecified Escherichia coli [E. coli] as the cause of diseases classified elsewhere: Secondary | ICD-10-CM | POA: Diagnosis present

## 2017-02-19 DIAGNOSIS — K760 Fatty (change of) liver, not elsewhere classified: Secondary | ICD-10-CM | POA: Diagnosis present

## 2017-02-19 DIAGNOSIS — N433 Hydrocele, unspecified: Secondary | ICD-10-CM | POA: Diagnosis present

## 2017-02-19 DIAGNOSIS — N5089 Other specified disorders of the male genital organs: Secondary | ICD-10-CM

## 2017-02-19 DIAGNOSIS — D72829 Elevated white blood cell count, unspecified: Secondary | ICD-10-CM | POA: Diagnosis not present

## 2017-02-19 DIAGNOSIS — J449 Chronic obstructive pulmonary disease, unspecified: Secondary | ICD-10-CM | POA: Diagnosis present

## 2017-02-19 DIAGNOSIS — E785 Hyperlipidemia, unspecified: Secondary | ICD-10-CM | POA: Diagnosis present

## 2017-02-19 DIAGNOSIS — R319 Hematuria, unspecified: Secondary | ICD-10-CM

## 2017-02-19 DIAGNOSIS — R310 Gross hematuria: Secondary | ICD-10-CM | POA: Diagnosis present

## 2017-02-19 DIAGNOSIS — F17210 Nicotine dependence, cigarettes, uncomplicated: Secondary | ICD-10-CM | POA: Diagnosis present

## 2017-02-19 DIAGNOSIS — Z833 Family history of diabetes mellitus: Secondary | ICD-10-CM

## 2017-02-19 DIAGNOSIS — I739 Peripheral vascular disease, unspecified: Secondary | ICD-10-CM | POA: Diagnosis present

## 2017-02-19 DIAGNOSIS — N432 Other hydrocele: Secondary | ICD-10-CM | POA: Diagnosis not present

## 2017-02-19 DIAGNOSIS — F419 Anxiety disorder, unspecified: Secondary | ICD-10-CM | POA: Diagnosis present

## 2017-02-19 DIAGNOSIS — M549 Dorsalgia, unspecified: Secondary | ICD-10-CM | POA: Diagnosis present

## 2017-02-19 LAB — URINALYSIS, COMPLETE (UACMP) WITH MICROSCOPIC
Bilirubin Urine: NEGATIVE
Glucose, UA: NEGATIVE mg/dL
Ketones, ur: NEGATIVE mg/dL
NITRITE: NEGATIVE
PROTEIN: NEGATIVE mg/dL
SPECIFIC GRAVITY, URINE: 1.016 (ref 1.005–1.030)
pH: 5 (ref 5.0–8.0)

## 2017-02-19 LAB — CBC WITH DIFFERENTIAL/PLATELET
Basophils Absolute: 0.1 10*3/uL (ref 0–0.1)
Basophils Relative: 1 %
Eosinophils Absolute: 0.1 10*3/uL (ref 0–0.7)
Eosinophils Relative: 0 %
HEMATOCRIT: 37.4 % — AB (ref 40.0–52.0)
Hemoglobin: 12.6 g/dL — ABNORMAL LOW (ref 13.0–18.0)
LYMPHS ABS: 3 10*3/uL (ref 1.0–3.6)
Lymphocytes Relative: 17 %
MCH: 30.7 pg (ref 26.0–34.0)
MCHC: 33.7 g/dL (ref 32.0–36.0)
MCV: 91.2 fL (ref 80.0–100.0)
MONO ABS: 1.2 10*3/uL — AB (ref 0.2–1.0)
MONOS PCT: 7 %
NEUTROS ABS: 13.5 10*3/uL — AB (ref 1.4–6.5)
Neutrophils Relative %: 75 %
Platelets: 168 10*3/uL (ref 150–440)
RBC: 4.1 MIL/uL — ABNORMAL LOW (ref 4.40–5.90)
RDW: 13.1 % (ref 11.5–14.5)
WBC: 18 10*3/uL — ABNORMAL HIGH (ref 3.8–10.6)

## 2017-02-19 LAB — BASIC METABOLIC PANEL
ANION GAP: 9 (ref 5–15)
BUN: 20 mg/dL (ref 6–20)
CHLORIDE: 99 mmol/L — AB (ref 101–111)
CO2: 28 mmol/L (ref 22–32)
Calcium: 8.9 mg/dL (ref 8.9–10.3)
Creatinine, Ser: 0.93 mg/dL (ref 0.61–1.24)
GFR calc Af Amer: >60 mL/min (ref >60)
GFR calc non Af Amer: >60 mL/min (ref >60)
GLUCOSE: 99 mg/dL (ref 65–99)
Potassium: 3.2 mmol/L — ABNORMAL LOW (ref 3.5–5.1)
Sodium: 136 mmol/L (ref 135–145)

## 2017-02-19 LAB — CHLAMYDIA/NGC RT PCR (ARMC ONLY)
CHLAMYDIA TR: NOT DETECTED
N GONORRHOEAE: NOT DETECTED

## 2017-02-19 MED ORDER — HEPARIN SODIUM (PORCINE) 5000 UNIT/ML IJ SOLN
5000.0000 [IU] | Freq: Three times a day (TID) | INTRAMUSCULAR | Status: DC
  Administered 2017-02-19 – 2017-02-20 (×2): 5000 [IU] via SUBCUTANEOUS
  Filled 2017-02-19 (×2): qty 1

## 2017-02-19 MED ORDER — DOCUSATE SODIUM 100 MG PO CAPS
100.0000 mg | ORAL_CAPSULE | Freq: Two times a day (BID) | ORAL | Status: DC
  Administered 2017-02-19: 100 mg via ORAL
  Filled 2017-02-19: qty 1

## 2017-02-19 MED ORDER — ALBUTEROL SULFATE (2.5 MG/3ML) 0.083% IN NEBU
3.0000 mL | INHALATION_SOLUTION | Freq: Four times a day (QID) | RESPIRATORY_TRACT | Status: DC | PRN
  Administered 2017-02-19: 3 mL via RESPIRATORY_TRACT
  Filled 2017-02-19: qty 3

## 2017-02-19 MED ORDER — PIPERACILLIN-TAZOBACTAM 3.375 G IVPB 30 MIN
3.3750 g | Freq: Once | INTRAVENOUS | Status: AC
  Administered 2017-02-19: 3.375 g via INTRAVENOUS
  Filled 2017-02-19: qty 50

## 2017-02-19 MED ORDER — METOPROLOL TARTRATE 50 MG PO TABS
100.0000 mg | ORAL_TABLET | Freq: Two times a day (BID) | ORAL | Status: DC
  Administered 2017-02-19: 100 mg via ORAL
  Filled 2017-02-19: qty 2

## 2017-02-19 MED ORDER — MOMETASONE FURO-FORMOTEROL FUM 200-5 MCG/ACT IN AERO
2.0000 | INHALATION_SPRAY | Freq: Two times a day (BID) | RESPIRATORY_TRACT | Status: DC
  Administered 2017-02-20: 2 via RESPIRATORY_TRACT
  Filled 2017-02-19: qty 8.8

## 2017-02-19 MED ORDER — PIPERACILLIN-TAZOBACTAM 3.375 G IVPB
3.3750 g | Freq: Three times a day (TID) | INTRAVENOUS | Status: DC
  Administered 2017-02-19 – 2017-02-20 (×2): 3.375 g via INTRAVENOUS
  Filled 2017-02-19 (×2): qty 50

## 2017-02-19 MED ORDER — FLUOXETINE HCL 10 MG PO CAPS: 10.0000 mg | ORAL_CAPSULE | Freq: Every day | ORAL | Status: DC

## 2017-02-19 MED ORDER — CLINDAMYCIN HCL 300 MG PO CAPS: 300.0000 mg | ORAL_CAPSULE | Freq: Three times a day (TID) | ORAL | 0 refills | Status: DC

## 2017-02-19 MED ORDER — HYDROMORPHONE HCL 1 MG/ML IJ SOLN
0.5000 mg | Freq: Once | INTRAMUSCULAR | Status: AC
  Administered 2017-02-19: 0.5 mg via INTRAVENOUS
  Filled 2017-02-19: qty 1

## 2017-02-19 MED ORDER — ISOSORBIDE MONONITRATE ER 30 MG PO TB24: 30.0000 mg | ORAL_TABLET | Freq: Every day | ORAL | Status: DC

## 2017-02-19 MED ORDER — ATORVASTATIN CALCIUM 10 MG PO TABS: 10.0000 mg | ORAL_TABLET | Freq: Every day | ORAL | Status: DC

## 2017-02-19 MED ORDER — OMEGA-3-ACID ETHYL ESTERS 1 G PO CAPS: 1.0000 g | ORAL_CAPSULE | Freq: Every day | ORAL | Status: DC

## 2017-02-19 MED ORDER — ONDANSETRON HCL 4 MG/2ML IJ SOLN
4.0000 mg | Freq: Once | INTRAMUSCULAR | Status: AC
  Administered 2017-02-19: 4 mg via INTRAVENOUS
  Filled 2017-02-19: qty 2

## 2017-02-19 MED ORDER — ACETAMINOPHEN 325 MG PO TABS: 650.0000 mg | ORAL_TABLET | Freq: Four times a day (QID) | ORAL | Status: DC | PRN

## 2017-02-19 MED ORDER — PANTOPRAZOLE SODIUM 40 MG PO TBEC: 40.0000 mg | DELAYED_RELEASE_TABLET | Freq: Every day | ORAL | Status: DC

## 2017-02-19 MED ORDER — ROPINIROLE HCL 1 MG PO TABS
1.0000 mg | ORAL_TABLET | Freq: Every day | ORAL | Status: DC
  Administered 2017-02-19: 1 mg via ORAL
  Filled 2017-02-19: qty 1

## 2017-02-19 MED ORDER — ONDANSETRON HCL 4 MG PO TABS: 4.0000 mg | ORAL_TABLET | Freq: Three times a day (TID) | ORAL | Status: DC | PRN

## 2017-02-19 MED ORDER — OXYCODONE HCL 5 MG PO TABS
15.0000 mg | ORAL_TABLET | Freq: Four times a day (QID) | ORAL | Status: DC | PRN
  Administered 2017-02-19 – 2017-02-20 (×2): 15 mg via ORAL
  Filled 2017-02-19 (×2): qty 3

## 2017-02-19 MED ORDER — CEPHALEXIN 500 MG PO CAPS: 500.0000 mg | ORAL_CAPSULE | Freq: Two times a day (BID) | ORAL | 0 refills | Status: DC

## 2017-02-19 MED ORDER — CLINDAMYCIN PHOSPHATE 600 MG/50ML IV SOLN
600.0000 mg | Freq: Once | INTRAVENOUS | Status: AC
  Administered 2017-02-19: 600 mg via INTRAVENOUS
  Filled 2017-02-19: qty 50

## 2017-02-19 MED ORDER — DOCUSATE SODIUM 100 MG PO CAPS: 100.0000 mg | ORAL_CAPSULE | Freq: Two times a day (BID) | ORAL | Status: DC | PRN

## 2017-02-19 MED ORDER — GABAPENTIN 400 MG PO CAPS
400.0000 mg | ORAL_CAPSULE | Freq: Three times a day (TID) | ORAL | Status: DC
  Administered 2017-02-19: 400 mg via ORAL
  Filled 2017-02-19: qty 1

## 2017-02-19 MED ORDER — VANCOMYCIN HCL IN DEXTROSE 1-5 GM/200ML-% IV SOLN
1000.0000 mg | Freq: Once | INTRAVENOUS | Status: AC
  Administered 2017-02-19: 1000 mg via INTRAVENOUS
  Filled 2017-02-19: qty 200

## 2017-02-19 MED ORDER — TIOTROPIUM BROMIDE MONOHYDRATE 18 MCG IN CAPS
18.0000 ug | ORAL_CAPSULE | Freq: Every day | RESPIRATORY_TRACT | Status: DC
  Administered 2017-02-20: 18 ug via RESPIRATORY_TRACT
  Filled 2017-02-19: qty 5

## 2017-02-19 MED ORDER — ASPIRIN EC 325 MG PO TBEC
325.0000 mg | DELAYED_RELEASE_TABLET | Freq: Every day | ORAL | Status: DC
  Administered 2017-02-19: 325 mg via ORAL
  Filled 2017-02-19: qty 1

## 2017-02-19 MED ORDER — POLYETHYLENE GLYCOL 3350 17 G PO PACK: 17.0000 g | PACK | Freq: Every day | ORAL | Status: DC | PRN

## 2017-02-19 MED ORDER — IOPAMIDOL (ISOVUE-300) INJECTION 61%
100.0000 mL | Freq: Once | INTRAVENOUS | Status: AC | PRN
  Administered 2017-02-19: 100 mL via INTRAVENOUS

## 2017-02-19 MED ORDER — SODIUM CHLORIDE 0.9 % IV SOLN
1250.0000 mg | Freq: Two times a day (BID) | INTRAVENOUS | Status: DC
  Administered 2017-02-20: 1250 mg via INTRAVENOUS
  Filled 2017-02-19 (×3): qty 1250

## 2017-02-19 NOTE — ED Notes (Signed)
Patient refusing admission at this time. Dr. Corky Downs at bedside.

## 2017-02-19 NOTE — ED Notes (Signed)
Patient now requesting to stay and be admitted. Dr. Corky Downs informed.

## 2017-02-19 NOTE — ED Notes (Signed)
Patient has signed electronic signature AMA form. Patient verbalizes understanding and still wishes to leave.

## 2017-02-19 NOTE — ED Notes (Signed)
Patient states, "I'm really don't want to come in to the hospital." Patient educated on rationale. Verbalizes understanding.

## 2017-02-19 NOTE — ED Provider Notes (Signed)
Patient has changed his mind and now agrees to stay   Lavonia Drafts, MD 02/19/17 [phone]

## 2017-02-19 NOTE — Progress Notes (Signed)
Pharmacy Antibiotic Note  Bradley Sawyer is a 61 y.o. male admitted on 02/19/2017 with  cellulitis.  Pharmacy has been consulted for Zosyn and Vancomycin dosing. Patient received vancomycin 1gm IV and Zosyn 3.375 IV x 1 dose in ED  Plan: Ke: 0.083   Vd: 65   t1/2: 8.35  Will start patient on Vancomycin 1250mg  IV every 12 hours with 6 hour stack dosing. Calculated trough at Css is 14. Trough level ordered prior to 4th dose. Will monitor renal function and adjust dose as needed.   Will start patient on Zosyn 3.375 IV EI every 8 hours.   Height: 5\' 11"  (180.3 cm) Weight: 207 lb (93.9 kg) IBW/kg (Calculated) : 75.3  Temp (24hrs), Avg:99 F (37.2 C), Min:99 F (37.2 C), Max:99 F (37.2 C)   Recent Labs Lab 02/19/17 1459  WBC 18.0*  CREATININE 0.93    Estimated Creatinine Clearance: 98.8 mL/min (by C-G formula based on SCr of 0.93 mg/dL).    No Known Allergies  Antimicrobials this admission: 3/31 zosyn >>  3/31 vancomycin >>   Dose adjustments this admission:  Microbiology results: 3/31 BCx: sent 3/31 UCx: sent   Thank you for allowing pharmacy to be a part of this patient's care.  Pernell Dupre, PharmD, BCPS Clinical Pharmacist 02/19/2017 6:41 PM

## 2017-02-19 NOTE — ED Triage Notes (Signed)
Pt sent from [facility] for reports of hematuria for three days and testicle swelling that began last night.

## 2017-02-19 NOTE — ED Notes (Signed)
Pt states noticed blood in urine and then last night L testicle swelling "to three times the normal size." Pt states hx of same and was told "there was a seed stuck in my tube and they gave me some medicine." Pt asked if he had a kidney stone and he responded "I don't know it was when I was 18." Pt states he gave urine at [facility] and can't go right now. Pt did not want to be hooked up to cardiac monitor but let this RN do it, stating "I came here for pain not my heart." States has an appt on Monday with PCP but states he  Couldn't wait d/t pain.

## 2017-02-19 NOTE — Discharge Instructions (Signed)
It would be best and safest if you stayed in the hospital. Since you will not please take the antibiotics as directed. Please call the urologist for follow-up. Please return here for increasing pain fever redness or swelling. Please use your regular pain medicine for the pain.

## 2017-02-19 NOTE — ED Notes (Signed)
Patient transported to [facility] 

## 2017-02-19 NOTE — ED Notes (Addendum)
Called patient for triage.  Patient outside speaking on the phone and smoking a cigarette..  Will call patient again.

## 2017-02-19 NOTE — ED Notes (Signed)
Patient placed on 3L [place] due to SpO2 85%.

## 2017-02-19 NOTE — Consult Note (Addendum)
Consult: Left testicle pain, scrotal erythema Requested by: Dr. Conni Slipper  History of Present Illness: I spoke with Dr. Cinda Quest about this patient earlier today. At that point the patient was leaving, but I see that he was admitted. The patient has had a 3 day history of red urine, no clots. He is a smoker. Then yesterday the left testicle became painful and gradually swelled over the past 2 days. He had a white count of 18, MAXIMUM TEMPERATURE of 99, UA with too numerous to count white cells and a few red cells and rare bacteria. He underwent a scrotal ultrasound which revealed no mass or torsion of the testicles. Blood flow seemed symmetric with left scrotal wall thickening. A CT scan with contrast was obtained although the delays stop halfway down with no delays through the distal ureters or the bladder. Findings included a left retroperitoneal hernia, bilateral "hernias containing fat" with infiltrative changes on the left (likely postop from October 2017 hernia repair-care everywhere). The prostate appeared normal. Possibly some irregularity of the right posterior bladder wall. The scan went all the way through the scrotum with out evidence of air or abscess in the scrotum. The patient reports he has no dysuria and is voiding with a good stream. He feels like his urine looks clear tonight.  The patient had a PSA of 0.4 in 2016.  Past Medical History:  Diagnosis Date  . Anxiety   . Arthritis   . 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 Cocaine dependence    Last use 1998, attends 3 NA meetings weekly.  . Compression fracture    L2  . COPD (chronic obstructive pulmonary disease) (HCC)    bronchitis  . CVA (cerebral infarction)   . Disorder of vocal cord   . Fatty liver   . GERD (gastroesophageal reflux disease)   . Hx of cardiovascular stress test    ETT/Lexiscan-Myoview (10/15):  Inferior, inferoapical, apical lateral defect c/w mild ischemia, EF 66%; Intermediate  Risk  . Hyperlipidemia   . Hypertension   . Right shoulder pain    2/2 partial rotator cuff tear, tendinopathy, mild subacromial/subdeltioid bursitis, A/C joint arthropathy per MRI done in 3/07  . Tobacco abuse    Past Surgical History:  Procedure Laterality Date  . ANTERIOR LAT LUMBAR FUSION N/A 12/18/2013   Procedure: Lumbar Two Anteriorlateral Corpectomy w/ cage, interbody fusion, anterior plating, Lumbar One to Lumbar Three percutaneous pedicle screws;  Surgeon: Charlie Pitter, MD;  Location: [place] [facility];  Service: Neurosurgery;  Laterality: N/A;  . AORTA -INNOMIATE BYPASS N/A 10/15/2014   Procedure: ENDARTERCTOMY OF  INNOMINATE ARTERY ;  Surgeon: Elam Dutch, MD;  Location: [place];  Service: Vascular;  Laterality: N/A;  . ARCH AORTOGRAM N/A 08/23/2014   Procedure: ARCH AORTOGRAM;  Surgeon: Elam Dutch, MD;  Location: [facility] CATH LAB;  Service: Cardiovascular;  Laterality: N/A;  . arch aortogram, left carotid,left subclavian angiogram  08-23-2014  . CAROTID ANGIOGRAM Left 08/23/2014   Procedure: CAROTID ANGIOGRAM;  Surgeon: Elam Dutch, MD;  Location: [facility] CATH LAB;  Service: Cardiovascular;  Laterality: Left;  . ENDARTERECTOMY Right 10/15/2014   Procedure: ENDARTERECTOMY RIGHT SUBCLAVIAN ARTERY;  Surgeon: Elam Dutch, MD;  Location: [place];  Service: Vascular;  Laterality: Right;  . ENDARTERECTOMY Right 10/15/2014   Procedure: ENDARTERECTOMY RIGHT COMMON CAROTID ARTERY;  Surgeon: Elam Dutch, MD;  Location: [place];  Service: Vascular;  Laterality: Right;  . LEFT HEART CATHETERIZATION WITH CORONARY ANGIOGRAM N/A 09/12/2014  Procedure: LEFT HEART CATHETERIZATION WITH CORONARY ANGIOGRAM;  Surgeon: Josue Hector, MD;  Location: [facility] CATH LAB;  Service: Cardiovascular;  Laterality: N/A;  . LUMBAR SPINE SURGERY  12/18/2013   L2     DR POOL  . NECK SURGERY    . PATCH ANGIOPLASTY Right 10/15/2014   Procedure: PATCH ANGIOPLASTY OF RIGHT SUBCLAVIAN ARTERY , RIGHT COMMON CAROTID  ARTERY & INNOMINATE ARTERY;  Surgeon: Elam Dutch, MD;  Location: [place];  Service: Vascular;  Laterality: Right;  . STERNOTOMY N/A 10/15/2014   Procedure: PARTIAL STERNOTOMY & PLATING OF STERNUM;  Surgeon: Elam Dutch, MD;  Location: [place];  Service: Vascular;  Laterality: N/A;  . TEE WITHOUT CARDIOVERSION N/A 12/25/2013   Procedure: TRANSESOPHAGEAL ECHOCARDIOGRAM (TEE);  Surgeon: Larey Dresser, MD;  Location[facility] ENDOSCOPY;  Service: Cardiovascular;  Laterality: N/A;    Home Medications:  Prescriptions Prior to Admission  Medication Sig Dispense Refill Last Dose  . ADVAIR DISKUS 500-50 MCG/DOSE AEPB INHALE 1 PUFF INTO THE LUNGS EVERY 12 (TWELVE) HOURS. 180 each 3 Taking  . albuterol (ACCUNEB) 1.25 MG/3ML nebulizer solution Take 3 mLs by nebulization every 6 (six) hours as needed.   prn at prn  . aspirin EC 81 MG tablet Take 81 mg by mouth daily.     Marland Kitchen atorvastatin (LIPITOR) 10 MG tablet Take 1 tablet (10 mg total) by mouth daily. 30 tablet 11 Taking  . docusate sodium (COLACE) 100 MG capsule Take 1 capsule (100 mg total) by mouth 2 (two) times daily. 10 capsule 0 prn at prn  . FLUoxetine (PROZAC) 10 MG capsule Take 1 capsule (10 mg total) by mouth daily. 90 capsule 3 Taking  . ondansetron (ZOFRAN) 4 MG tablet Take 1 tablet (4 mg total) by mouth every 8 (eight) hours as needed for nausea or vomiting. 10 tablet 0   . rOPINIRole (REQUIP) 1 MG tablet Take 1 mg by mouth at bedtime.   unknown at unknown  . tiotropium (SPIRIVA) 18 MCG inhalation capsule Place 18 mcg into inhaler and inhale daily.     . isosorbide mononitrate (IMDUR) 30 MG 24 hr tablet Take 1 tablet (30 mg total) by mouth daily. 30 tablet 0   . lisinopril-hydrochlorothiazide (PRINZIDE,ZESTORETIC) 20-12.5 MG per tablet TAKE 1 TABLET BY MOUTH DAILY. 30 tablet 6 Taking  . meloxicam (MOBIC) 15 MG tablet TAKE 1 TABLET BY MOUTH EVERY DAY 30 tablet 3 Taking  . oxyCODONE (ROXICODONE) 15 MG immediate release tablet Take 1 tablet by  mouth 4 (four) times daily.   Taking  . pantoprazole (PROTONIX) 40 MG tablet TAKE 1 TABLET (40 MG TOTAL) BY MOUTH DAILY. 30 tablet 6 Taking  . polyethylene glycol (MIRALAX / GLYCOLAX) packet Take 17 g by mouth daily as needed (for constipation).   Taking  . PROAIR HFA 108 (90 BASE) MCG/ACT inhaler INHALE 2 PUFFS INTO THE LUNGS EVERY 6 (SIX) HOURS AS NEEDED FOR WHEEZING. 22.5 Inhaler 1 prn at prn  . promethazine (PHENERGAN) 12.5 MG tablet Take 2 tablets (25 mg total) by mouth every 6 (six) hours as needed for nausea or vomiting. 10 tablet 0 prn at prn   Allergies: No Known Allergies  Family History  Problem Relation Age of Onset  . Heart disease Father   . Heart attack Father   . Diabetes Paternal Grandmother     1 st degree relatives  . Hypertension Paternal Grandmother   . Diabetes Brother   . Heart disease Brother   . Muscular dystrophy Brother   .  Muscular dystrophy Sister    Social History:  reports that he has been smoking Cigarettes.  He has a 48.00 pack-year smoking history. He has never used smokeless tobacco. He reports that he does not drink alcohol or use drugs.  ROS: A complete review of systems was performed.  All systems are negative except for pertinent findings as noted. Review of Systems  All other systems reviewed and are negative.    Physical Exam:  Vital signs in last 24 hours: Temp:  [98 F (36.7 C)-99 F (37.2 C)] 98 F (36.7 C) (03/31 1926) Pulse Rate:  [68-81] 70 (03/31 1926) Resp:  [16-20] 20 (03/31 1926) BP: (124-145)/(65-83) 136/76 (03/31 1926) SpO2:  [85 %-97 %] 92 % (03/31 1926) Weight:  [91.9 kg (202 lb 9.6 oz)-93.9 kg (207 lb)] 91.9 kg (202 lb 9.6 oz) (03/31 1926) General:  Alert and oriented, No acute distress HEENT: Normocephalic, atraumatic Cardiovascular: Regular rate and rhythm Lungs: Regular rate and effort Abdomen: Soft, nontender, nondistended, no abdominal masses, there were no palpable inguinal hernias. There is a well-healed left  inguinal hernia scar without tenderness, erythema or pain. Back: No CVA tenderness Extremities: No edema Neurologic: Grossly intact Lymphadenopathy: There was no inguinal lymphadenopathy. GU: The penis is circumcised without mass or lesion. The meatus appeared normal without discharge. The right testicle is palpably normal. The left testicle and epididymis were swollen and indurated. There was no fluctuance. There was some mild reactive erythema and edema of the left scrotal skin.  Laboratory Data:  Results for orders placed or performed during the hospital encounter of 02/19/17 (from the past 24 hour(s))  Basic metabolic panel     Status: Abnormal   Collection Time: 02/19/17  2:59 PM  Result Value Ref Range   Sodium 136 135 - 145 mmol/L   Potassium 3.2 (L) 3.5 - 5.1 mmol/L   Chloride 99 (L) 101 - 111 mmol/L   CO2 28 22 - 32 mmol/L   Glucose, Bld 99 65 - 99 mg/dL   BUN 20 6 - 20 mg/dL   Creatinine, Ser 0.93 0.61 - 1.24 mg/dL   Calcium 8.9 8.9 - 10.3 mg/dL   GFR calc non Af Amer >60 >60 mL/min   GFR calc Af Amer >60 >60 mL/min   Anion gap 9 5 - 15  CBC with Differential     Status: Abnormal   Collection Time: 02/19/17  2:59 PM  Result Value Ref Range   WBC 18.0 (H) 3.8 - 10.6 K/uL   RBC 4.10 (L) 4.40 - 5.90 MIL/uL   Hemoglobin 12.6 (L) 13.0 - 18.0 g/dL   HCT 37.4 (L) 40.0 - 52.0 %   MCV 91.2 80.0 - 100.0 fL   MCH 30.7 26.0 - 34.0 pg   MCHC 33.7 32.0 - 36.0 g/dL   RDW 13.1 11.5 - 14.5 %   Platelets 168 150 - 440 K/uL   Neutrophils Relative % 75 %   Neutro Abs 13.5 (H) 1.4 - 6.5 K/uL   Lymphocytes Relative 17 %   Lymphs Abs 3.0 1.0 - 3.6 K/uL   Monocytes Relative 7 %   Monocytes Absolute 1.2 (H) 0.2 - 1.0 K/uL   Eosinophils Relative 0 %   Eosinophils Absolute 0.1 0 - 0.7 K/uL   Basophils Relative 1 %   Basophils Absolute 0.1 0 - 0.1 K/uL  Urinalysis, Complete w Microscopic     Status: Abnormal   Collection Time: 02/19/17  2:59 PM  Result Value Ref Range   Color, Urine  YELLOW (A) YELLOW   APPearance HAZY (A) CLEAR   Specific Gravity, Urine 1.016 1.005 - 1.030   pH 5.0 5.0 - 8.0   Glucose, UA NEGATIVE NEGATIVE mg/dL   Hgb urine dipstick MODERATE (A) NEGATIVE   Bilirubin Urine NEGATIVE NEGATIVE   Ketones, ur NEGATIVE NEGATIVE mg/dL   Protein, ur NEGATIVE NEGATIVE mg/dL   Nitrite NEGATIVE NEGATIVE   Leukocytes, UA MODERATE (A) NEGATIVE   RBC / HPF 6-30 0 - 5 RBC/hpf   WBC, UA TOO NUMEROUS TO COUNT 0 - 5 WBC/hpf   Bacteria, UA RARE (A) NONE SEEN   Squamous Epithelial / LPF 0-5 (A) NONE SEEN   Mucous PRESENT   Chlamydia/NGC rt PCR ([facility] only)     Status: None   Collection Time: 02/19/17  2:59 PM  Result Value Ref Range   Specimen source GC/Chlam URINE, RANDOM    Chlamydia Tr NOT DETECTED NOT DETECTED   N gonorrhoeae NOT DETECTED NOT DETECTED   Recent Results (from the past 240 hour(s))  Chlamydia/NGC rt PCR ([facility] only)     Status: None   Collection Time: 02/19/17  2:59 PM  Result Value Ref Range Status   Specimen source GC/Chlam URINE, RANDOM  Final   Chlamydia Tr NOT DETECTED NOT DETECTED Final   N gonorrhoeae NOT DETECTED NOT DETECTED Final    Comment: (NOTE) 100  This methodology has not been evaluated in pregnant women or in 200  patients with a history of hysterectomy. 300 400  This methodology will not be performed on patients less than 40  years of age.    Creatinine:  Recent Labs  02/19/17 1459  CREATININE 0.93    Impression/Assessment/Plan: 1) left epididymoorchitis-history, exam and UA all consistent with left testicular infection/inflammation. Patient anxious to get home and if his vitals remain stable, pain and white count improve significantly, he could likely be discharged on levofloxacin 500 mg orally once daily for 10 days. Return precautions discussed with patient. The left scrotum appears healthy and likely reactive changes to the testicular process.  2) infiltrative changes along the left cord are likely incidental and  from recent hernia repair. No clinically worrisome symptoms or hernia on exam.  3) left retroperitoneal hematoma-patient had no flank pain and this is likely an incidental finding.  4) gross hematuria-patient voiding without difficulty and passing no clots. Discussed importance of follow-up with patient for repeat exam in the office and cystoscopy.  Vishwa Dais 02/19/2017, 11:03 PM

## 2017-02-19 NOTE — ED Provider Notes (Addendum)
[facility] Emergency Department Provider Note   ____________________________________________   First MD Initiated Contact with Patient 02/19/17 1352     (approximate)  I have reviewed the triage vital signs and the nursing notes.   HISTORY  Chief Complaint Hematuria and Groin Swelling   HPI DAXTER PAULE is a 61 y.o. male who reports 3 days of hematuria and then last night has left testicle began swelling and got very painful. He reports he woke up and interrupted his wet dream last night didn't finish the wet dream. He says when he was 18 he had something similar happen they told him a seat was stuck in his tube and blocked up his testicle gave him some medicine in the past. Patient complains of severe pain in the testicle.  Past Medical History:  Diagnosis Date  . Anxiety   . Arthritis   . 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 Cocaine dependence    Last use 1998, attends 3 NA meetings weekly.  . Compression fracture    L2  . COPD (chronic obstructive pulmonary disease) (HCC)    bronchitis  . CVA (cerebral infarction)   . Disorder of vocal cord   . Fatty liver   . GERD (gastroesophageal reflux disease)   . Hx of cardiovascular stress test    ETT/Lexiscan-Myoview (10/15):  Inferior, inferoapical, apical lateral defect c/w mild ischemia, EF 66%; Intermediate Risk  . Hyperlipidemia   . Hypertension   . Right shoulder pain    2/2 partial rotator cuff tear, tendinopathy, mild subacromial/subdeltioid bursitis, A/C joint arthropathy per MRI done in 3/07  . Tobacco abuse     Patient Active Problem List   Diagnosis Date Noted  . Elevated troponin 05/08/2016  . Acute viral pharyngitis 03/20/2015  . Olecranon bursitis of left elbow 12/23/2014  . Embolic stroke ([place]) 63/78/5885  . Carotid stenosis 10/15/2014  . 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 PAD  (peripheral artery disease) ([place]) 08/23/2014  . Preventative health care 08/12/2014  . Occlusion and stenosis of carotid artery without mention of cerebral infarction 07/25/2014  . Subclavian steal syndrome 07/25/2014  . CAD (coronary artery disease) 07/10/2014  . CVA (cerebral infarction) 04/10/2014  . 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 Compression fracture of L2, with back and right groin pain 07/18/2013  . History of colon polyps 05/18/2012  . COPD (chronic obstructive pulmonary disease) ([place]) 12/24/2009  . Hyperlipemia 09/20/2006  . TOBACCO ABUSE 09/20/2006  . Essential hypertension 09/20/2006  . GERD 09/20/2006    Past Surgical History:  Procedure Laterality Date  . ANTERIOR LAT LUMBAR FUSION N/A 12/18/2013   Procedure: Lumbar Two Anteriorlateral Corpectomy w/ cage, interbody fusion, anterior plating, Lumbar One to Lumbar Three percutaneous pedicle screws;  Surgeon: Charlie Pitter, MD;  Location: [facility];  Service: Neurosurgery;  Laterality: N/A;  . AORTA -INNOMIATE BYPASS N/A 10/15/2014   Procedure: ENDARTERCTOMY OF  INNOMINATE ARTERY ;  Surgeon: Elam Dutch, MD;  Location: [place];  Service: Vascular;  Laterality: N/A;  . ARCH AORTOGRAM N/A 08/23/2014   Procedure: ARCH AORTOGRAM;  Surgeon: Elam Dutch, MD;  Location: [facility] CATH LAB;  Service: Cardiovascular;  Laterality: N/A;  . arch aortogram, left carotid,left subclavian angiogram  08-23-2014  . CAROTID ANGIOGRAM Left 08/23/2014   Procedure: CAROTID ANGIOGRAM;  Surgeon: Elam Dutch, MD;  Location: [facility] CATH LAB;  Service: Cardiovascular;  Laterality: Left;  .  ENDARTERECTOMY Right 10/15/2014   Procedure: ENDARTERECTOMY RIGHT SUBCLAVIAN ARTERY;  Surgeon: Elam Dutch, MD;  Location: [place];  Service: Vascular;  Laterality: Right;  . ENDARTERECTOMY Right 10/15/2014   Procedure: ENDARTERECTOMY RIGHT COMMON CAROTID ARTERY;  Surgeon: Elam Dutch, MD;  Location: [place];  Service: Vascular;   Laterality: Right;  . LEFT HEART CATHETERIZATION WITH CORONARY ANGIOGRAM N/A 09/12/2014   Procedure: LEFT HEART CATHETERIZATION WITH CORONARY ANGIOGRAM;  Surgeon: Josue Hector, MD;  Location: [facility] CATH LAB;  Service: Cardiovascular;  Laterality: N/A;  . LUMBAR SPINE SURGERY  12/18/2013   L2     DR POOL  . NECK SURGERY    . PATCH ANGIOPLASTY Right 10/15/2014   Procedure: PATCH ANGIOPLASTY OF RIGHT SUBCLAVIAN ARTERY , RIGHT COMMON CAROTID ARTERY & INNOMINATE ARTERY;  Surgeon: Elam Dutch, MD;  Location: [place];  Service: Vascular;  Laterality: Right;  . STERNOTOMY N/A 10/15/2014   Procedure: PARTIAL STERNOTOMY & PLATING OF STERNUM;  Surgeon: Elam Dutch, MD;  Location: [place];  Service: Vascular;  Laterality: N/A;  . TEE WITHOUT CARDIOVERSION N/A 12/25/2013   Procedure: TRANSESOPHAGEAL ECHOCARDIOGRAM (TEE);  Surgeon: Larey Dresser, MD;  Location: [place];  Service: Cardiovascular;  Laterality: N/A;    Prior to Admission medications   Medication Sig Start Date End Date Taking? Authorizing Provider  ADVAIR DISKUS 500-50 MCG/DOSE AEPB INHALE 1 PUFF INTO THE LUNGS EVERY 12 (TWELVE) HOURS. 09/08/15   Carly Montey Hora, MD  albuterol (ACCUNEB) 1.25 MG/3ML nebulizer solution Take 3 mLs by nebulization every 6 (six) hours as needed.    Historical Provider, MD  aspirin EC 325 MG EC tablet Take 1 tablet (325 mg total) by mouth daily. 05/08/16   Nicholes Mango, MD  atorvastatin (LIPITOR) 10 MG tablet Take 1 tablet (10 mg total) by mouth daily. 03/14/15   Juliet Rude, MD  azithromycin (ZITHROMAX) 250 MG tablet Take 1 tablet by mouth once daily for 4 days 05/09/16   Nicholes Mango, MD  docusate sodium (COLACE) 100 MG capsule Take 1 capsule (100 mg total) by mouth 2 (two) times daily. 05/08/16   Nicholes Mango, MD  FLUoxetine (PROZAC) 10 MG capsule Take 1 capsule (10 mg total) by mouth daily. 04/03/15   Carly Montey Hora, MD  gabapentin (NEURONTIN) 400 MG capsule TAKE 2 CAPSULES (800 MG TOTAL) BY MOUTH 3 (THREE)  TIMES DAILY. 09/08/15   Juliet Rude, MD  isosorbide mononitrate (IMDUR) 30 MG 24 hr tablet Take 1 tablet (30 mg total) by mouth daily. 05/08/16   Nicholes Mango, MD  lisinopril-hydrochlorothiazide (PRINZIDE,ZESTORETIC) 20-12.5 MG per tablet TAKE 1 TABLET BY MOUTH DAILY. 07/08/15   Carly Montey Hora, MD  meloxicam (MOBIC) 15 MG tablet TAKE 1 TABLET BY MOUTH EVERY DAY 09/24/15   Juliet Rude, MD  metoprolol (LOPRESSOR) 100 MG tablet Take 100 mg by mouth 2 (two) times daily.    Historical Provider, MD  omega-3 acid ethyl esters (LOVAZA) 1 G capsule TAKE 2 CAPSULES (2 G TOTAL) BY MOUTH 2 (TWO) TIMES DAILY. 07/29/15   Carly Montey Hora, MD  ondansetron (ZOFRAN) 4 MG tablet Take 1 tablet (4 mg total) by mouth every 8 (eight) hours as needed for nausea or vomiting. 09/28/15   Lisa Roca, MD  oxyCODONE (ROXICODONE) 15 MG immediate release tablet Take 1 tablet by mouth 4 (four) times daily. 12/20/15   Historical Provider, MD  pantoprazole (PROTONIX) 40 MG tablet TAKE 1 TABLET (40 MG TOTAL) BY MOUTH DAILY. 07/08/15  Juliet Rude, MD  polyethylene glycol (MIRALAX / GLYCOLAX) packet Take 17 g by mouth daily as needed (for constipation).    Historical Provider, MD  predniSONE (STERAPRED UNI-PAK 21 TAB) 10 MG (21) TBPK tablet Take 1 tablet (10 mg total) by mouth daily. Take 6 tablets by mouth for 1 day followed by  5 tablets by mouth for 1 day followed by  4 tablets by mouth for 1 day followed by  3 tablets by mouth for 1 day followed by  2 tablets by mouth for 1 day followed by  1 tablet by mouth for a day and stop 05/08/16   Nicholes Mango, MD  PROAIR HFA 108 (90 BASE) MCG/ACT inhaler INHALE 2 PUFFS INTO THE LUNGS EVERY 6 (SIX) HOURS AS NEEDED FOR WHEEZING. 05/01/15   Juliet Rude, MD  promethazine (PHENERGAN) 12.5 MG tablet Take 2 tablets (25 mg total) by mouth every 6 (six) hours as needed for nausea or vomiting. 09/28/15   Lisa Roca, MD  rOPINIRole (REQUIP) 1 MG tablet Take 1 mg by mouth at bedtime.    Historical  Provider, MD  tiotropium (SPIRIVA) 18 MCG inhalation capsule Place 18 mcg into inhaler and inhale daily.    Historical Provider, MD    Allergies Patient has no known allergies.  Family History  Problem Relation Age of Onset  . Heart disease Father   . Heart attack Father   . Diabetes Paternal Grandmother     1 st degree relatives  . Hypertension Paternal Grandmother   . Diabetes Brother   . Heart disease Brother   . Muscular dystrophy Brother   . Muscular dystrophy Sister     Social History Social History  Substance Use Topics  . Smoking status: Current Every Day Smoker    Packs/day: 1.00    Years: 48.00    Types: Cigarettes    Last attempt to quit: 09/23/2014  . Smokeless tobacco: Never Used  . Alcohol use No    Review of Systems Constitutional: No fever/chills Eyes: No visual changes. ENT: No sore throat. Cardiovascular: Denies chest pain. Respiratory: Denies shortness of breath. Gastrointestinal: No abdominal pain.  No nausea, no vomiting.  No diarrhea.  No constipation. Musculoskeletal: Negative for back pain. Skin: Negative for rash.  10-point ROS otherwise negative.  ____________________________________________   PHYSICAL EXAM:  VITAL SIGNS: ED Triage Vitals  Enc Vitals Group     BP 02/19/17 1346 137/79     Pulse Rate 02/19/17 1346 78     Resp 02/19/17 1346 18     Temp 02/19/17 1346 99 F (37.2 C)     Temp Source 02/19/17 1346 Oral     SpO2 02/19/17 1346 94 %     Weight 02/19/17 1347 207 lb (93.9 kg)     Height 02/19/17 1347 5\' 11"  (1.803 m)     Head Circumference --      Peak Flow --      Pain Score 02/19/17 1346 7     Pain Loc --      Pain Edu? --      Excl. in [place]? --     Constitutional: Alert and oriented. Well appearing and in no acute distress. Eyes: Conjunctivae are normal. PERRL. EOMI. Head: Atraumatic. Nose: No congestion/rhinnorhea. Mouth/Throat: Mucous membranes are moist.  Oropharynx non-erythematous. Neck: No stridor.     Cardiovascular: Normal rate, regular rhythm. Grossly normal heart sounds.  Good peripheral circulation. Respiratory: Normal respiratory effort.  No retractions. Lungs CTAB. Gastrointestinal: Soft and  nontender. No distention. No abdominal bruits. No CVA tenderness. Genitourinary: Normal circumcised male normal right testicle left testicle is swollen and hard tender skin overlying it is red Musculoskeletal: No lower extremity tenderness nor edema.  No joint effusions. Neurologic:  Normal speech and language. No gross focal neurologic deficits are appreciated. No gait instability. Skin:  Skin is warm, dry and intact. No rash noted.   ____________________________________________   LABS (all labs ordered are listed, but only abnormal results are displayed)  Labs Reviewed  BASIC METABOLIC PANEL - Abnormal; Notable for the following:       Result Value   Potassium 3.2 (*)    Chloride 99 (*)    All other components within normal limits  CBC WITH DIFFERENTIAL/PLATELET - Abnormal; Notable for the following:    WBC 18.0 (*)    RBC 4.10 (*)    Hemoglobin 12.6 (*)    HCT 37.4 (*)    Neutro Abs 13.5 (*)    Monocytes Absolute 1.2 (*)    All other components within normal limits  URINE CULTURE  CHLAMYDIA/NGC RT PCR ([facility] ONLY)  URINALYSIS, COMPLETE (UACMP) WITH MICROSCOPIC   ____________________________________________  EKG   ____________________________________________  RADIOLOGY Study Result   CLINICAL DATA:  Left testicular swelling, redness, pain. Gross hematuria x3 days.  EXAM: SCROTAL ULTRASOUND  DOPPLER ULTRASOUND OF THE TESTICLES  TECHNIQUE: Complete ultrasound examination of the testicles, epididymis, and other scrotal structures was performed. Color and spectral Doppler ultrasound were also utilized to evaluate blood flow to the testicles.  COMPARISON:  05/11/2010  FINDINGS: Right testicle  Measurements: 4.8 x 2.6 x 3.4 cm . No mass or  microlithiasis visualized.  Left testicle  Measurements: 4.5 x 2.9 x 3.5 cm. No mass or microlithiasis visualized.  Right epididymis: 0.6 x 0.7 cm cyst in the epididymal head. Otherwise Normal in size and appearance.  Left epididymis: 0.6 x 0.5 cm cyst in the epididymal head. Body of the epididymis is heterogeneous and enlarged without hyperemia. No focal mass.  Hydrocele:  Small, bilateral.  Varicocele:  None visualized.  Pulsed Doppler interrogation of both testes demonstrates normal low resistance arterial and venous waveforms bilaterally.  Moderate scrotal skin thickening left greater than right.  IMPRESSION: 1. Negative for testicular mass or torsion. 2. Small bilateral hydroceles. 3. Small epididymal cysts bilaterally. 4. No hyperemia to suggest epididymo-orchitis.   Electronically Signed   By: Lucrezia Europe M.D.   On: 02/19/2017 15:01      ____________________________________________   PROCEDURES  Procedure(s) performed:  Procedures  Critical Care performed:   ____________________________________________   INITIAL IMPRESSION / ASSESSMENT AND PLAN / ED COURSE  Pertinent labs & imaging results that were available during my care of the patient were reviewed by me and considered in my medical decision making (see chart for details).  Discussed in detail or worries about cellulitis and Fournier's gangrene and described to him the fact that if he had Fournier's gangrene everything from his belt line to the middle part of his legs and in between could rot off or have to be removed surgically. He was not impressed with this and says he wants to go home and take some antibiotics by mouth. I explained to him in some detail that this might not be the safest course that we're worried about him but he says he'll sign a paper and he wants to go home and doesn't want to be here. He does agree to get the CT of his abdomen and pelvis. We will  do this and  approach him again after some IV antibiotics are given.   Dr. Corky Downs will follow up the CT scan and attempt to talk him into staying in the hospital.   ____________________________________________   FINAL CLINICAL IMPRESSION(S) / ED DIAGNOSES  Final diagnoses:  Hematuria, unspecified type  Cellulitis, scrotum  Epididymitis      NEW MEDICATIONS STARTED DURING THIS VISIT:  New Prescriptions   No medications on file     Note:  This document was prepared using Dragon voice recognition software and may include unintentional dictation errors.    Nena Polio, MD 02/19/17 [place], MD 02/19/17 [place], MD 02/19/17 [phone]

## 2017-02-19 NOTE — ED Notes (Signed)
Patient transported to CT 

## 2017-02-19 NOTE — ED Notes (Signed)
Patient to sign AMA and finish antibiotics per verbal order from Dr. Corky Downs.

## 2017-02-19 NOTE — H&P (Signed)
[facility] NAME: Bradley Sawyer    MR#:  [id number]  DATE OF BIRTH:  01-Feb-1956  DATE OF ADMISSION:  02/19/2017  PRIMARY CARE PHYSICIAN: Curly Rim, MD   REQUESTING/REFERRING PHYSICIAN: Malinda  CHIEF COMPLAINT:   Chief Complaint  Patient presents with  . Hematuria  . Groin Swelling    HISTORY OF PRESENT ILLNESS: Bradley Sawyer  is a 61 y.o. male with a known history of Anxiety, arthritis, COPD, cerebrovascular accident, gastric esophageal reflux disease, hyperlipidemia, hypertension, active smoking- he came to emergency room today as he was sent from [facility] clinic to come over here for hematuria. He says 2 days ago he started noticing some blood in his urine and since yesterday night his scrotum is also swollen and it is red and it is painful so he went to the [facility] clinic walk-in center, and they suggested to go to ER. He also had some fever and chills last night. He denies any abdominal or back pain and denies any vomiting and nausea. He was noted to have UTI ER physician spoke to urologist on phone and he suggested to do a CT scan on the abdomen to rule out any bladder or kidney pathologies, it is done and does not show any significant findings except for left scrotal hernia and so ER physician suggested to admit to medical team.  PAST MEDICAL HISTORY:   Past Medical History:  Diagnosis Date  . Anxiety   . Arthritis   . 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 Cocaine dependence    Last use 1998, attends 3 NA meetings weekly.  . Compression fracture    L2  . COPD (chronic obstructive pulmonary disease) (HCC)    bronchitis  . CVA (cerebral infarction)   . Disorder of vocal cord   . Fatty liver   . GERD (gastroesophageal reflux disease)   . Hx of cardiovascular stress test    ETT/Lexiscan-Myoview (10/15):  Inferior, inferoapical, apical lateral defect c/w mild ischemia, EF 66%; Intermediate Risk  .  Hyperlipidemia   . Hypertension   . Right shoulder pain    2/2 partial rotator cuff tear, tendinopathy, mild subacromial/subdeltioid bursitis, A/C joint arthropathy per MRI done in 3/07  . Tobacco abuse     PAST SURGICAL HISTORY: Past Surgical History:  Procedure Laterality Date  . ANTERIOR LAT LUMBAR FUSION N/A 12/18/2013   Procedure: Lumbar Two Anteriorlateral Corpectomy w/ cage, interbody fusion, anterior plating, Lumbar One to Lumbar Three percutaneous pedicle screws;  Surgeon: Charlie Pitter, MD;  Location: [place] [facility];  Service: Neurosurgery;  Laterality: N/A;  . AORTA -INNOMIATE BYPASS N/A 10/15/2014   Procedure: ENDARTERCTOMY OF  INNOMINATE ARTERY ;  Surgeon: Elam Dutch, MD;  Location: [place];  Service: Vascular;  Laterality: N/A;  . ARCH AORTOGRAM N/A 08/23/2014   Procedure: ARCH AORTOGRAM;  Surgeon: Elam Dutch, MD;  Location: [facility] CATH LAB;  Service: Cardiovascular;  Laterality: N/A;  . arch aortogram, left carotid,left subclavian angiogram  08-23-2014  . CAROTID ANGIOGRAM Left 08/23/2014   Procedure: CAROTID ANGIOGRAM;  Surgeon: Elam Dutch, MD;  Location: [facility] CATH LAB;  Service: Cardiovascular;  Laterality: Left;  . ENDARTERECTOMY Right 10/15/2014   Procedure: ENDARTERECTOMY RIGHT SUBCLAVIAN ARTERY;  Surgeon: Elam Dutch, MD;  Location: [place];  Service: Vascular;  Laterality: Right;  . ENDARTERECTOMY Right 10/15/2014   Procedure: ENDARTERECTOMY RIGHT COMMON CAROTID ARTERY;  Surgeon: Elam Dutch, MD;  Location: [place];  Service: Vascular;  Laterality: Right;  . LEFT HEART CATHETERIZATION WITH CORONARY ANGIOGRAM N/A 09/12/2014   Procedure: LEFT HEART CATHETERIZATION WITH CORONARY ANGIOGRAM;  Surgeon: Josue Hector, MD;  Location: [facility] CATH LAB;  Service: Cardiovascular;  Laterality: N/A;  . LUMBAR SPINE SURGERY  12/18/2013   L2     DR POOL  . NECK SURGERY    . PATCH ANGIOPLASTY Right 10/15/2014   Procedure: PATCH ANGIOPLASTY OF RIGHT SUBCLAVIAN ARTERY , RIGHT  COMMON CAROTID ARTERY & INNOMINATE ARTERY;  Surgeon: Elam Dutch, MD;  Location: [place];  Service: Vascular;  Laterality: Right;  . STERNOTOMY N/A 10/15/2014   Procedure: PARTIAL STERNOTOMY & PLATING OF STERNUM;  Surgeon: Elam Dutch, MD;  Location: [place];  Service: Vascular;  Laterality: N/A;  . TEE WITHOUT CARDIOVERSION N/A 12/25/2013   Procedure: TRANSESOPHAGEAL ECHOCARDIOGRAM (TEE);  Surgeon: Larey Dresser, MD;  Location: [place];  Service: Cardiovascular;  Laterality: N/A;    SOCIAL HISTORY:  Social History  Substance Use Topics  . Smoking status: Current Every Day Smoker    Packs/day: 1.00    Years: 48.00    Types: Cigarettes    Last attempt to quit: 09/23/2014  . Smokeless tobacco: Never Used  . Alcohol use No    FAMILY HISTORY:  Family History  Problem Relation Age of Onset  . Heart disease Father   . Heart attack Father   . Diabetes Paternal Grandmother     1 st degree relatives  . Hypertension Paternal Grandmother   . Diabetes Brother   . Heart disease Brother   . Muscular dystrophy Brother   . Muscular dystrophy Sister     DRUG ALLERGIES: No Known Allergies  REVIEW OF SYSTEMS:   CONSTITUTIONAL: No fever, fatigue or weakness.  EYES: No blurred or double vision.  EARS, NOSE, AND THROAT: No tinnitus or ear pain.  RESPIRATORY: No cough, shortness of breath, wheezing or hemoptysis.  CARDIOVASCULAR: No chest pain, orthopnea, edema.  GASTROINTESTINAL: No nausea, vomiting, diarrhea or abdominal pain.  GENITOURINARY: No dysuria, He have hematuria.  ENDOCRINE: No polyuria, nocturia,  HEMATOLOGY: No anemia, easy bruising or bleeding SKIN: No rash or lesion. MUSCULOSKELETAL: No joint pain or arthritis.   NEUROLOGIC: No tingling, numbness, weakness.  PSYCHIATRY: No anxiety or depression.   MEDICATIONS AT HOME:  Prior to Admission medications   Medication Sig Start Date End Date Taking? Authorizing Provider  ADVAIR DISKUS 500-50 MCG/DOSE AEPB INHALE 1  PUFF INTO THE LUNGS EVERY 12 (TWELVE) HOURS. 09/08/15   Carly Montey Hora, MD  albuterol (ACCUNEB) 1.25 MG/3ML nebulizer solution Take 3 mLs by nebulization every 6 (six) hours as needed.    Historical Provider, MD  aspirin EC 325 MG EC tablet Take 1 tablet (325 mg total) by mouth daily. 05/08/16   Nicholes Mango, MD  atorvastatin (LIPITOR) 10 MG tablet Take 1 tablet (10 mg total) by mouth daily. 03/14/15   Juliet Rude, MD  azithromycin (ZITHROMAX) 250 MG tablet Take 1 tablet by mouth once daily for 4 days 05/09/16   Nicholes Mango, MD  cephALEXin (KEFLEX) 500 MG capsule Take 1 capsule (500 mg total) by mouth 2 (two) times daily. 02/19/17   Lavonia Drafts, MD  clindamycin (CLEOCIN) 300 MG capsule Take 1 capsule (300 mg total) by mouth 3 (three) times daily. 02/19/17 03/01/17  Lavonia Drafts, MD  docusate sodium (COLACE) 100 MG capsule Take 1 capsule (100 mg total) by mouth 2 (two) times daily. 05/08/16   Nicholes Mango, MD  FLUoxetine (PROZAC) 10 MG capsule Take 1 capsule (10 mg total) by mouth daily. 04/03/15   Carly Montey Hora, MD  gabapentin (NEURONTIN) 400 MG capsule TAKE 2 CAPSULES (800 MG TOTAL) BY MOUTH 3 (THREE) TIMES DAILY. 09/08/15   Juliet Rude, MD  isosorbide mononitrate (IMDUR) 30 MG 24 hr tablet Take 1 tablet (30 mg total) by mouth daily. 05/08/16   Nicholes Mango, MD  lisinopril-hydrochlorothiazide (PRINZIDE,ZESTORETIC) 20-12.5 MG per tablet TAKE 1 TABLET BY MOUTH DAILY. 07/08/15   Carly Montey Hora, MD  meloxicam (MOBIC) 15 MG tablet TAKE 1 TABLET BY MOUTH EVERY DAY 09/24/15   Juliet Rude, MD  metoprolol (LOPRESSOR) 100 MG tablet Take 100 mg by mouth 2 (two) times daily.    Historical Provider, MD  omega-3 acid ethyl esters (LOVAZA) 1 G capsule TAKE 2 CAPSULES (2 G TOTAL) BY MOUTH 2 (TWO) TIMES DAILY. 07/29/15   Carly Montey Hora, MD  ondansetron (ZOFRAN) 4 MG tablet Take 1 tablet (4 mg total) by mouth every 8 (eight) hours as needed for nausea or vomiting. 09/28/15   Lisa Roca, MD  oxyCODONE (ROXICODONE) 15 MG  immediate release tablet Take 1 tablet by mouth 4 (four) times daily. 12/20/15   Historical Provider, MD  pantoprazole (PROTONIX) 40 MG tablet TAKE 1 TABLET (40 MG TOTAL) BY MOUTH DAILY. 07/08/15   Carly Montey Hora, MD  polyethylene glycol (MIRALAX / GLYCOLAX) packet Take 17 g by mouth daily as needed (for constipation).    Historical Provider, MD  predniSONE (STERAPRED UNI-PAK 21 TAB) 10 MG (21) TBPK tablet Take 1 tablet (10 mg total) by mouth daily. Take 6 tablets by mouth for 1 day followed by  5 tablets by mouth for 1 day followed by  4 tablets by mouth for 1 day followed by  3 tablets by mouth for 1 day followed by  2 tablets by mouth for 1 day followed by  1 tablet by mouth for a day and stop 05/08/16   Nicholes Mango, MD  PROAIR HFA 108 (90 BASE) MCG/ACT inhaler INHALE 2 PUFFS INTO THE LUNGS EVERY 6 (SIX) HOURS AS NEEDED FOR WHEEZING. 05/01/15   Juliet Rude, MD  promethazine (PHENERGAN) 12.5 MG tablet Take 2 tablets (25 mg total) by mouth every 6 (six) hours as needed for nausea or vomiting. 09/28/15   Lisa Roca, MD  rOPINIRole (REQUIP) 1 MG tablet Take 1 mg by mouth at bedtime.    Historical Provider, MD  tiotropium (SPIRIVA) 18 MCG inhalation capsule Place 18 mcg into inhaler and inhale daily.    Historical Provider, MD      PHYSICAL EXAMINATION:   VITAL SIGNS: Blood pressure 126/77, pulse 73, temperature 99 F (37.2 C), temperature source Oral, resp. rate 18, height 5\' 11"  (1.803 m), weight 93.9 kg (207 lb), SpO2 97 %.  GENERAL:  61 y.o.-year-old patient lying in the bed with no acute distress.  EYES: Pupils equal, round, reactive to light and accommodation. No scleral icterus. Extraocular muscles intact.  HEENT: Head atraumatic, normocephalic. Oropharynx and nasopharynx clear.  NECK:  Supple, no jugular venous distention. No thyroid enlargement, no tenderness.  LUNGS: Normal breath sounds bilaterally, no wheezing, rales,rhonchi or crepitation. No use of accessory muscles of respiration.   CARDIOVASCULAR: S1, S2 normal. No murmurs, rubs, or gallops.  ABDOMEN: Soft, nontender, nondistended. Bowel sounds present. No organomegaly or mass. Left scrotal hernia present, tenderness on scrotum on touch and redness on left scrotum. No tenderness on bilateral costophrenic angles. EXTREMITIES: No pedal edema,  cyanosis, or clubbing.  NEUROLOGIC: Cranial nerves II through XII are intact. Muscle strength 5/5 in all extremities. Sensation intact. Gait not checked.  PSYCHIATRIC: The patient is alert and oriented x 3.  SKIN: No obvious rash, lesion, or ulcer.   LABORATORY PANEL:   CBC  Recent Labs Lab 02/19/17 1459  WBC 18.0*  HGB 12.6*  HCT 37.4*  PLT 168  MCV 91.2  MCH 30.7  MCHC 33.7  RDW 13.1  LYMPHSABS 3.0  MONOABS 1.2*  EOSABS 0.1  BASOSABS 0.1   ------------------------------------------------------------------------------------------------------------------  Chemistries   Recent Labs Lab 02/19/17 1459  NA 136  K 3.2*  CL 99*  CO2 28  GLUCOSE 99  BUN 20  CREATININE 0.93  CALCIUM 8.9   ------------------------------------------------------------------------------------------------------------------ estimated creatinine clearance is 98.8 mL/min (by C-G formula based on SCr of 0.93 mg/dL). ------------------------------------------------------------------------------------------------------------------ No results for input(s): TSH, T4TOTAL, T3FREE, THYROIDAB in the last 72 hours.  Invalid input(s): FREET3   Coagulation profile No results for input(s): INR, PROTIME in the last 168 hours. ------------------------------------------------------------------------------------------------------------------- No results for input(s): DDIMER in the last 72 hours. -------------------------------------------------------------------------------------------------------------------  Cardiac Enzymes No results for input(s): CKMB, TROPONINI, MYOGLOBIN in the last 168  hours.  Invalid input(s): CK ------------------------------------------------------------------------------------------------------------------ Invalid input(s): POCBNP  ---------------------------------------------------------------------------------------------------------------  Urinalysis    Component Value Date/Time   COLORURINE YELLOW (A) 02/19/2017 1459   APPEARANCEUR HAZY (A) 02/19/2017 1459   APPEARANCEUR Clear 06/27/2013 2105   LABSPEC 1.016 02/19/2017 1459   LABSPEC 1.014 06/27/2013 2105   PHURINE 5.0 02/19/2017 1459   GLUCOSEU NEGATIVE 02/19/2017 1459   GLUCOSEU Negative 06/27/2013 2105   GLUCOSEU NEG mg/dL 01/31/2009 2108   HGBUR MODERATE (A) 02/19/2017 1459   BILIRUBINUR NEGATIVE 02/19/2017 1459   BILIRUBINUR Negative 06/27/2013 2105   KETONESUR NEGATIVE 02/19/2017 1459   PROTEINUR NEGATIVE 02/19/2017 1459   UROBILINOGEN 1.0 10/19/2014 1504   NITRITE NEGATIVE 02/19/2017 1459   LEUKOCYTESUR MODERATE (A) 02/19/2017 1459   LEUKOCYTESUR Trace 06/27/2013 2105     RADIOLOGY: US Scrotum  Result Date: 02/19/2017 CLINICAL DATA:  Left testicular swelling, redness, pain. Gross hematuria x3 days. EXAM: SCROTAL ULTRASOUND DOPPLER ULTRASOUND OF THE TESTICLES TECHNIQUE: Complete ultrasound examination of the testicles, epididymis, and other scrotal structures was performed. Color and spectral Doppler ultrasound were also utilized to evaluate blood flow to the testicles. COMPARISON:  05/11/2010 FINDINGS: Right testicle Measurements: 4.8 x 2.6 x 3.4 cm . No mass or microlithiasis visualized. Left testicle Measurements: 4.5 x 2.9 x 3.5 cm. No mass or microlithiasis visualized. Right epididymis: 0.6 x 0.7 cm cyst in the epididymal head. Otherwise Normal in size and appearance. Left epididymis: 0.6 x 0.5 cm cyst in the epididymal head. Body of the epididymis is heterogeneous and enlarged without hyperemia. No focal mass. Hydrocele:  Small, bilateral. Varicocele:  None visualized. Pulsed  Doppler interrogation of both testes demonstrates normal low resistance arterial and venous waveforms bilaterally. Moderate scrotal skin thickening left greater than right. IMPRESSION: 1. Negative for testicular mass or torsion. 2. Small bilateral hydroceles. 3. Small epididymal cysts bilaterally. 4. No hyperemia to suggest epididymo-orchitis. Electronically Signed   By: Lucrezia Europe M.D.   On: 02/19/2017 15:01   Ct Abdomen Pelvis W Contrast  Result Date: 02/19/2017 CLINICAL DATA:  Blood in urine followed by LEFT testicular swelling last night, history hypertension, COPD, smoking, question kidney stone history EXAM: CT ABDOMEN AND PELVIS WITH CONTRAST TECHNIQUE: Multidetector CT imaging of the abdomen and pelvis was performed using the standard protocol following bolus administration of intravenous contrast. CONTRAST:  163mL ISOVUE-300 IOPAMIDOL (ISOVUE-300) INJECTION 61% COMPARISON:  02/29/2016 FINDINGS: Lower chest: Minimal dependent atelectasis at lung bases. Gallbladder surgically absent. Liver unremarkable. Hepatobiliary: Gallbladder surgically absent.  Liver unremarkable. Pancreas: Normal appearance Spleen: Normal appearance Adrenals/Urinary Tract: Small cyst at inferior pole RIGHT kidney 2.2 x 1.7 cm image 41. Adrenal glands, kidneys, ureters, and bladder otherwise normal appearance. Stomach/Bowel: Normal appendix. Sigmoid diverticulosis. Stomach incompletely distended unable to exclude proximal gastric wall thickening. Remaining bowel loops unremarkable. Vascular/Lymphatic: Atherosclerotic calcifications aorta and coronary arteries. Infrarenal aorta demonstrates mild aneurysmal dilatation to a maximum size of 3.2 x 2.9 cm image 36. Additional atherosclerotic calcifications in the iliac and femoral vessels. No adenopathy. Reproductive: Minimal prostatic enlargement with scattered prostatic calcifications. Seminal vesicles unremarkable. Other: BILATERAL inguinal hernias containing fat, larger on LEFT, with  associated mild infiltrative changes question of prior hernia repair. LEFT flank hernia with protrusion of fat through the defect. Minimal posterior displacement of the LEFT kidney without renal or bowel herniation. Musculoskeletal: Prior lumbar fusion and strut graft L1-L3. Osseous demineralization. No acute bony findings. IMPRESSION: Sigmoid diverticulosis without evidence of diverticulitis. 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 BILATERAL inguinal hernias and LEFT flank hernia containing fat. Unable to exclude proximal gastric wall thickening though this could be an artifact related underdistention ; consider followup endoscopy or upper GI exam to assess. Electronically Signed   By: Lavonia Dana M.D.   On: 02/19/2017 17:01   [place] Art/ven Flow Abd Pelv Doppler  Result Date: 02/19/2017 CLINICAL DATA:  Left testicular swelling, redness, pain. Gross hematuria x3 days. EXAM: SCROTAL ULTRASOUND DOPPLER ULTRASOUND OF THE TESTICLES TECHNIQUE: Complete ultrasound examination of the testicles, epididymis, and other scrotal structures was performed. Color and spectral Doppler ultrasound were also utilized to evaluate blood flow to the testicles. COMPARISON:  05/11/2010 FINDINGS: Right testicle Measurements: 4.8 x 2.6 x 3.4 cm . No mass or microlithiasis visualized. Left testicle Measurements: 4.5 x 2.9 x 3.5 cm. No mass or microlithiasis visualized. Right epididymis: 0.6 x 0.7 cm cyst in the epididymal head. Otherwise Normal in size and appearance. Left epididymis: 0.6 x 0.5 cm cyst in the epididymal head. Body of the epididymis is heterogeneous and enlarged without hyperemia. No focal mass. Hydrocele:  Small, bilateral. Varicocele:  None visualized. Pulsed Doppler interrogation of both testes demonstrates normal low resistance arterial and venous waveforms bilaterally. Moderate scrotal skin thickening left greater than right. IMPRESSION: 1.  Negative for testicular mass or torsion. 2. Small bilateral hydroceles. 3. Small epididymal cysts bilaterally. 4. No hyperemia to suggest epididymo-orchitis. Electronically Signed   By: Lucrezia Europe M.D.   On: 02/19/2017 15:01    EKG: Orders placed or performed during the hospital encounter of 05/08/16  . EKG 12-Lead  . EKG 12-Lead  . ED EKG  . ED EKG  . EKG    IMPRESSION AND PLAN:  * UTI   We will give IV vancomycin and Zosyn, urine culture is sent. CT scan of the abdomen does not show any pathology is in kidney or bladder.    * Cellulitis   He has scrotal redness and tenderness and there is bilateral inguinal hernia as per the CT scan of the abdomen, IV vancomycin and Zosyn for now and surgical consult to help with that hernia.  * COPD   No active wheezing, continue his inhalers.  * Hypertension   Stable, continue home medications.  * Active smoking   Counseled to quit smoking  for 4 minutes and offered nicotine patch.  All the records are reviewed and case discussed with ED provider. Management plans discussed with the patient, family and they are in agreement.  CODE STATUS: Full. Code Status History    Date Active Date Inactive Code Status Order ID Comments User Context   05/08/2016 10:01 AM 05/08/2016  4:55 PM Full Code [id number]  Harrie Foreman, MD Inpatient   10/15/2014  1:13 PM 10/20/2014  4:49 PM Full Code [id number]  Elam Dutch, MD Inpatient   09/12/2014  9:29 AM 09/12/2014  4:09 PM Full Code [id number]  Josue Hector, MD Inpatient   12/18/2013  2:29 PM 12/26/2013  2:29 PM Full Code [id number]  Charlie Pitter, MD Inpatient   09/29/2012  9:51 AM 09/29/2012  5:27 PM Full Code [id number]  Alvina Chou, PA-C ED   09/29/2012  8:20 AM 09/29/2012  9:51 AM Full Code [id number]  Carlisle Cater, PA ED       TOTAL TIME TAKING CARE OF THIS PATIENT: 50 minutes.    Vaughan Basta M.D on 02/19/2017   Between 7am to 6pm - Pager - [phone]  After 6pm go to  [email or web address] - password [email or web address] [facility]  Office  [phone]  CC: Primary care physician; Curly Rim, MD   Note: This dictation was prepared with Dragon dictation along with smaller phrase technology. Any transcriptional errors that result from this process are unintentional.

## 2017-02-19 NOTE — ED Notes (Signed)
Attempting to call report at this time. 

## 2017-02-19 NOTE — ED Notes (Signed)
Patient made aware of need of urine sample. States he is unable to void at this time. 

## 2017-02-19 NOTE — ED Provider Notes (Signed)
02/19/2017 at 5:21 PM:  The patient requested to leave.  I considered this to be leaving against medical advice. I personally discussed the following with them:  1)  That they currently had a medical condition of scrotal infection and I am concerned that they may have Fournier's gangrene, incarcerated hernia, blood stream infection  2)  My proposed course of evaluation and treatment includes, but is not limited to,  antibiotics and evaluation by surgical specialists.    3) Risks of leaving before this had been completed include: misdiagnosis, worsening illness leading up to and including prolonged or permanent disability or death.  Specific risks pertinent, but not all inclusive, of their current medical condition include but are not limited to death, severe disability, loss of re-productive organs.   Despite this they stated they wanted to leave due to [place] being tomorrow and refused further evaluation, treatment, or admission at this time.   They appeared clinically sober, were mentating appropriately, were free from distracting injury, had adequately controlled acute pain, appeared to have intact insight, judgment, and reason, and in my opinion had the capacity to make this decision.  Specifically, they were able to verbally state back in a coherent manner their current medical condition/current diagnosis, the proposed course of evaluation and/or treatment, and the risks, benefits, and alternatives of treatment versus leaving against medical advice.   They understand that they may return to seek medical attention here at ANY time they want.  I strongly advised them to return to the Emergency Department immediately if they experience any new or worsening symptoms that concern them, or simply if they reconsider continued evaluation and/or treatment as previously discussed.  This would be without any repercussions, though they understand they likely will need to wait again in the Emergency  Department if other patients are in front of them, rather than being brought straight back.  They understood this is another advantage of staying, but still insisted upon leaving.  I recommended they return to the emergency department at the earliest available opportunity  The patient was discharged against medical advice.  They did accept written discharge instructions.  Patient signed AMA form. Will discharge with PO antibiotics     Lavonia Drafts, MD 02/19/17 1724

## 2017-02-20 DIAGNOSIS — N432 Other hydrocele: Secondary | ICD-10-CM

## 2017-02-20 DIAGNOSIS — R319 Hematuria, unspecified: Secondary | ICD-10-CM

## 2017-02-20 DIAGNOSIS — N390 Urinary tract infection, site not specified: Secondary | ICD-10-CM

## 2017-02-20 LAB — BLOOD CULTURE ID PANEL (REFLEXED)
Acinetobacter baumannii: NOT DETECTED
CANDIDA GLABRATA: NOT DETECTED
CANDIDA KRUSEI: NOT DETECTED
Candida albicans: NOT DETECTED
Candida parapsilosis: NOT DETECTED
Candida tropicalis: NOT DETECTED
Carbapenem resistance: NOT DETECTED
ESCHERICHIA COLI: DETECTED — AB
Enterobacter cloacae complex: NOT DETECTED
Enterobacteriaceae species: DETECTED — AB
Enterococcus species: NOT DETECTED
Haemophilus influenzae: NOT DETECTED
KLEBSIELLA OXYTOCA: NOT DETECTED
KLEBSIELLA PNEUMONIAE: NOT DETECTED
Listeria monocytogenes: NOT DETECTED
Neisseria meningitidis: NOT DETECTED
Proteus species: NOT DETECTED
Pseudomonas aeruginosa: NOT DETECTED
SERRATIA MARCESCENS: NOT DETECTED
STAPHYLOCOCCUS AUREUS BCID: NOT DETECTED
STAPHYLOCOCCUS SPECIES: NOT DETECTED
STREPTOCOCCUS PYOGENES: NOT DETECTED
Streptococcus agalactiae: NOT DETECTED
Streptococcus pneumoniae: NOT DETECTED
Streptococcus species: NOT DETECTED

## 2017-02-20 LAB — BASIC METABOLIC PANEL
Anion gap: 6 (ref 5–15)
BUN: 20 mg/dL (ref 6–20)
CHLORIDE: 100 mmol/L — AB (ref 101–111)
CO2: 30 mmol/L (ref 22–32)
CREATININE: 1.07 mg/dL (ref 0.61–1.24)
Calcium: 9 mg/dL (ref 8.9–10.3)
Glucose, Bld: 97 mg/dL (ref 65–99)
POTASSIUM: 3.9 mmol/L (ref 3.5–5.1)
SODIUM: 136 mmol/L (ref 135–145)

## 2017-02-20 LAB — CBC
HCT: 38 % — ABNORMAL LOW (ref 40.0–52.0)
Hemoglobin: 13 g/dL (ref 13.0–18.0)
MCH: 31.2 pg (ref 26.0–34.0)
MCHC: 34.1 g/dL (ref 32.0–36.0)
MCV: 91.3 fL (ref 80.0–100.0)
PLATELETS: 162 10*3/uL (ref 150–440)
RBC: 4.17 MIL/uL — AB (ref 4.40–5.90)
RDW: 13.8 % (ref 11.5–14.5)
WBC: 15.1 10*3/uL — AB (ref 3.8–10.6)

## 2017-02-20 MED ORDER — LEVOFLOXACIN 500 MG PO TABS: 500.0000 mg | ORAL_TABLET | Freq: Every day | ORAL | 0 refills | Status: DC

## 2017-02-20 MED ORDER — LEVOFLOXACIN 500 MG PO TABS: 500.0000 mg | ORAL_TABLET | Freq: Every day | ORAL | 0 refills | Status: AC

## 2017-02-20 NOTE — Progress Notes (Signed)
Addendum to consult. Discussed with prime doc. Plan is for discharge as patient has been seen by urology and felt to be epididymitis. Patient is currently adequately treated for that and there is no acute general surgical needs. He will follow-up with his surgeon in [place].

## 2017-02-20 NOTE — Discharge Summary (Signed)
[facility] NAME: Bradley Sawyer    MR#:  [id number]  DATE OF BIRTH:  Dec 08, 1955  DATE OF ADMISSION:  02/19/2017 ADMITTING PHYSICIAN: Vaughan Basta, MD  DATE OF DISCHARGE: 02/20/2017  PRIMARY CARE PHYSICIAN: Curly Rim, MD    ADMISSION DIAGNOSIS:  Epididymitis [N45.1] Cellulitis, scrotum [N49.2] Pain in left testicle [N50.812] Swelling of left testicle [N50.89] Hematuria, unspecified type [R31.9]  DISCHARGE DIAGNOSIS:  Principal Problem:   left epididymoorchitis Chronic pain syndrome Left inguinal hernia SECONDARY DIAGNOSIS:   Past Medical History:  Diagnosis Date  . Anxiety   . Arthritis   . 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 Cocaine dependence    Last use 1998, attends 3 NA meetings weekly.  . Compression fracture    L2  . COPD (chronic obstructive pulmonary disease) (HCC)    bronchitis  . CVA (cerebral infarction)   . Disorder of vocal cord   . Fatty liver   . GERD (gastroesophageal reflux disease)   . Hx of cardiovascular stress test    ETT/Lexiscan-Myoview (10/15):  Inferior, inferoapical, apical lateral defect c/w mild ischemia, EF 66%; Intermediate Risk  . Hyperlipidemia   . Hypertension   . Right shoulder pain    2/2 partial rotator cuff tear, tendinopathy, mild subacromial/subdeltioid bursitis, A/C joint arthropathy per MRI done in 3/07  . Tobacco abuse     HOSPITAL COURSE:  61 year old male with history of COPD not requiring oxygen and chronic pain syndrome who presented with left testes pain and erythema.  1. left epididymoorchitis: As per urology exam and UA consistent with left testicular inflammation/infection. Patient was initially on vancomycin and Zosyn. As per urology recommendations patient can be discharged on Levaquin 500 milligrams daily for 10 days. Patient will follow-up with urology in 1 week. He may have a fever up to 24 hours.  2. Inguinal  hernia: Patient will follow up with his outpatient surgeon. He was evaluated by her surgeon here with above recommendations.  3. Gross hematuria: This is resolved  4. Chronic pain syndrome: Patient will continue outpatient medications. He follows with pain clinic.  5. COPD without acute exacerbation: Patient will continue on inhalers  6. Anxiety and depression: Continue Prozac  7. Essential hypertension: Continue isosorbide, lisinopril, HCTZ  8. Hyperlipidemia: Continue atorvastatin  9. Tobacco dependence: Patient is encouraged to quit smoking. Counseling was provided for 4 minutes.   DISCHARGE CONDITIONS AND DIET:  Stable Heart healthy diet  CONSULTS OBTAINED:  Treatment Team:  Festus Aloe, MD Florene Glen, MD  DRUG ALLERGIES:  No Known Allergies  DISCHARGE MEDICATIONS:   Current Discharge Medication List    START taking these medications   Details  levofloxacin (LEVAQUIN) 500 MG tablet Take 1 tablet (500 mg total) by mouth daily. Qty: 10 tablet, Refills: 0      CONTINUE these medications which have NOT CHANGED   Details  ADVAIR DISKUS 500-50 MCG/DOSE AEPB INHALE 1 PUFF INTO THE LUNGS EVERY 12 (TWELVE) HOURS. Qty: 180 each, Refills: 3    albuterol (ACCUNEB) 1.25 MG/3ML nebulizer solution Take 3 mLs by nebulization every 6 (six) hours as needed.    aspirin EC 81 MG tablet Take 81 mg by mouth daily.    atorvastatin (LIPITOR) 10 MG tablet Take 1 tablet (10 mg total) by mouth daily. Qty: 30 tablet, Refills: 11    docusate sodium (COLACE) 100 MG capsule Take 1 capsule (100 mg total) by mouth 2 (two) times daily. Qty: 10 capsule,  Refills: 0    FLUoxetine (PROZAC) 10 MG capsule Take 1 capsule (10 mg total) by mouth daily. Qty: 90 capsule, Refills: 3    ondansetron (ZOFRAN) 4 MG tablet Take 1 tablet (4 mg total) by mouth every 8 (eight) hours as needed for nausea or vomiting. Qty: 10 tablet, Refills: 0    rOPINIRole (REQUIP) 1 MG tablet Take 1 mg by  mouth at bedtime.    tiotropium (SPIRIVA) 18 MCG inhalation capsule Place 18 mcg into inhaler and inhale daily.    isosorbide mononitrate (IMDUR) 30 MG 24 hr tablet Take 1 tablet (30 mg total) by mouth daily. Qty: 30 tablet, Refills: 0    lisinopril-hydrochlorothiazide (PRINZIDE,ZESTORETIC) 20-12.5 MG per tablet TAKE 1 TABLET BY MOUTH DAILY. Qty: 30 tablet, Refills: 6    meloxicam (MOBIC) 15 MG tablet TAKE 1 TABLET BY MOUTH EVERY DAY Qty: 30 tablet, Refills: 3    oxyCODONE (ROXICODONE) 15 MG immediate release tablet Take 1 tablet by mouth 4 (four) times daily.    pantoprazole (PROTONIX) 40 MG tablet TAKE 1 TABLET (40 MG TOTAL) BY MOUTH DAILY. Qty: 30 tablet, Refills: 6    polyethylene glycol (MIRALAX / GLYCOLAX) packet Take 17 g by mouth daily as needed (for constipation).    PROAIR HFA 108 (90 BASE) MCG/ACT inhaler INHALE 2 PUFFS INTO THE LUNGS EVERY 6 (SIX) HOURS AS NEEDED FOR WHEEZING. Qty: 22.5 Inhaler, Refills: 1    promethazine (PHENERGAN) 12.5 MG tablet Take 2 tablets (25 mg total) by mouth every 6 (six) hours as needed for nausea or vomiting. Qty: 10 tablet, Refills: 0      STOP taking these medications     gabapentin (NEURONTIN) 400 MG capsule      metoprolol (LOPRESSOR) 100 MG tablet      omega-3 acid ethyl esters (LOVAZA) 1 G capsule           Today   CHIEF COMPLAINT:   Patient states pain and swelling have improved. Patient anxious to be discharged home   VITAL SIGNS:  Blood pressure (!) 149/79, pulse 76, temperature 99.9 F (37.7 C), temperature source Oral, resp. rate (!) 24, height 5\' 11"  (1.803 m), weight 91.9 kg (202 lb 9.6 oz), SpO2 90 %.   REVIEW OF SYSTEMS:  Review of Systems  Constitutional: Negative.  Negative for chills, fever and malaise/fatigue.  HENT: Negative.  Negative for ear discharge, ear pain, hearing loss, nosebleeds and sore throat.   Eyes: Negative.  Negative for blurred vision and pain.  Respiratory: Negative.  Negative for  cough, hemoptysis, shortness of breath and wheezing.   Cardiovascular: Negative.  Negative for chest pain, palpitations and leg swelling.  Gastrointestinal: Negative.  Negative for abdominal pain, blood in stool, diarrhea, nausea and vomiting.  Genitourinary: Negative.  Negative for dysuria.       Left testes red and pain better  Musculoskeletal: Negative.  Negative for back pain.  Skin: Negative.   Neurological: Negative for dizziness, tremors, speech change, focal weakness, seizures and headaches.  Endo/Heme/Allergies: Negative.  Does not bruise/bleed easily.  Psychiatric/Behavioral: Negative.  Negative for depression, hallucinations and suicidal ideas.     PHYSICAL EXAMINATION:  GENERAL:  61 y.o.-year-old patient lying in the bed with no acute distress.  NECK:  Supple, no jugular venous distention. No thyroid enlargement, no tenderness.  LUNGS: Normal breath sounds bilaterally, no wheezing, rales,rhonchi  No use of accessory muscles of respiration.  CARDIOVASCULAR: S1, S2 normal. No murmurs, rubs, or gallops.  ABDOMEN: Soft, non-tender, non-distended. Bowel sounds  present. No organomegaly or mass.  EXTREMITIES: No pedal edema, cyanosis, or clubbing.  PSYCHIATRIC: The patient is alert and oriented x 3.  SKIN: redness left testes  GU left testes red and warm not tender DATA REVIEW:   CBC  Recent Labs Lab 02/20/17 0326  WBC 15.1*  HGB 13.0  HCT 38.0*  PLT 162    Chemistries   Recent Labs Lab 02/20/17 0326  NA 136  K 3.9  CL 100*  CO2 30  GLUCOSE 97  BUN 20  CREATININE 1.07  CALCIUM 9.0    Cardiac Enzymes No results for input(s): TROPONINI in the last 168 hours.  Microbiology Results  @[email or web address] @  RADIOLOGY:  US Scrotum  Result Date: 02/19/2017 CLINICAL DATA:  Left testicular swelling, redness, pain. Gross hematuria x3 days. EXAM: SCROTAL ULTRASOUND DOPPLER ULTRASOUND OF THE TESTICLES TECHNIQUE: Complete ultrasound examination of the testicles,  epididymis, and other scrotal structures was performed. Color and spectral Doppler ultrasound were also utilized to evaluate blood flow to the testicles. COMPARISON:  05/11/2010 FINDINGS: Right testicle Measurements: 4.8 x 2.6 x 3.4 cm . No mass or microlithiasis visualized. Left testicle Measurements: 4.5 x 2.9 x 3.5 cm. No mass or microlithiasis visualized. Right epididymis: 0.6 x 0.7 cm cyst in the epididymal head. Otherwise Normal in size and appearance. Left epididymis: 0.6 x 0.5 cm cyst in the epididymal head. Body of the epididymis is heterogeneous and enlarged without hyperemia. No focal mass. Hydrocele:  Small, bilateral. Varicocele:  None visualized. Pulsed Doppler interrogation of both testes demonstrates normal low resistance arterial and venous waveforms bilaterally. Moderate scrotal skin thickening left greater than right. IMPRESSION: 1. Negative for testicular mass or torsion. 2. Small bilateral hydroceles. 3. Small epididymal cysts bilaterally. 4. No hyperemia to suggest epididymo-orchitis. Electronically Signed   By: Lucrezia Europe M.D.   On: 02/19/2017 15:01   Ct Abdomen Pelvis W Contrast  Result Date: 02/19/2017 CLINICAL DATA:  Blood in urine followed by LEFT testicular swelling last night, history hypertension, COPD, smoking, question kidney stone history EXAM: CT ABDOMEN AND PELVIS WITH CONTRAST TECHNIQUE: Multidetector CT imaging of the abdomen and pelvis was performed using the standard protocol following bolus administration of intravenous contrast. CONTRAST:  139mL ISOVUE-300 IOPAMIDOL (ISOVUE-300) INJECTION 61% COMPARISON:  02/29/2016 FINDINGS: Lower chest: Minimal dependent atelectasis at lung bases. Gallbladder surgically absent. Liver unremarkable. Hepatobiliary: Gallbladder surgically absent.  Liver unremarkable. Pancreas: Normal appearance Spleen: Normal appearance Adrenals/Urinary Tract: Small cyst at inferior pole RIGHT kidney 2.2 x 1.7 cm image 41. Adrenal glands, kidneys, ureters,  and bladder otherwise normal appearance. Stomach/Bowel: Normal appendix. Sigmoid diverticulosis. Stomach incompletely distended unable to exclude proximal gastric wall thickening. Remaining bowel loops unremarkable. Vascular/Lymphatic: Atherosclerotic calcifications aorta and coronary arteries. Infrarenal aorta demonstrates mild aneurysmal dilatation to a maximum size of 3.2 x 2.9 cm image 36. Additional atherosclerotic calcifications in the iliac and femoral vessels. No adenopathy. Reproductive: Minimal prostatic enlargement with scattered prostatic calcifications. Seminal vesicles unremarkable. Other: BILATERAL inguinal hernias containing fat, larger on LEFT, with associated mild infiltrative changes question of prior hernia repair. LEFT flank hernia with protrusion of fat through the defect. Minimal posterior displacement of the LEFT kidney without renal or bowel herniation. Musculoskeletal: Prior lumbar fusion and strut graft L1-L3. Osseous demineralization. No acute bony findings. IMPRESSION: Sigmoid diverticulosis without evidence of diverticulitis. 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 BILATERAL inguinal hernias and LEFT flank hernia containing fat. Unable to exclude proximal gastric wall  thickening though this could be an artifact related underdistention ; consider followup endoscopy or upper GI exam to assess. Electronically Signed   By: Lavonia Dana M.D.   On: 02/19/2017 17:01   [place] Art/ven Flow Abd Pelv Doppler  Result Date: 02/19/2017 CLINICAL DATA:  Left testicular swelling, redness, pain. Gross hematuria x3 days. EXAM: SCROTAL ULTRASOUND DOPPLER ULTRASOUND OF THE TESTICLES TECHNIQUE: Complete ultrasound examination of the testicles, epididymis, and other scrotal structures was performed. Color and spectral Doppler ultrasound were also utilized to evaluate blood flow to the testicles. COMPARISON:  05/11/2010 FINDINGS: Right  testicle Measurements: 4.8 x 2.6 x 3.4 cm . No mass or microlithiasis visualized. Left testicle Measurements: 4.5 x 2.9 x 3.5 cm. No mass or microlithiasis visualized. Right epididymis: 0.6 x 0.7 cm cyst in the epididymal head. Otherwise Normal in size and appearance. Left epididymis: 0.6 x 0.5 cm cyst in the epididymal head. Body of the epididymis is heterogeneous and enlarged without hyperemia. No focal mass. Hydrocele:  Small, bilateral. Varicocele:  None visualized. Pulsed Doppler interrogation of both testes demonstrates normal low resistance arterial and venous waveforms bilaterally. Moderate scrotal skin thickening left greater than right. IMPRESSION: 1. Negative for testicular mass or torsion. 2. Small bilateral hydroceles. 3. Small epididymal cysts bilaterally. 4. No hyperemia to suggest epididymo-orchitis. Electronically Signed   By: Lucrezia Europe M.D.   On: 02/19/2017 15:01      Current Discharge Medication List    START taking these medications   Details  levofloxacin (LEVAQUIN) 500 MG tablet Take 1 tablet (500 mg total) by mouth daily. Qty: 10 tablet, Refills: 0      CONTINUE these medications which have NOT CHANGED   Details  ADVAIR DISKUS 500-50 MCG/DOSE AEPB INHALE 1 PUFF INTO THE LUNGS EVERY 12 (TWELVE) HOURS. Qty: 180 each, Refills: 3    albuterol (ACCUNEB) 1.25 MG/3ML nebulizer solution Take 3 mLs by nebulization every 6 (six) hours as needed.    aspirin EC 81 MG tablet Take 81 mg by mouth daily.    atorvastatin (LIPITOR) 10 MG tablet Take 1 tablet (10 mg total) by mouth daily. Qty: 30 tablet, Refills: 11    docusate sodium (COLACE) 100 MG capsule Take 1 capsule (100 mg total) by mouth 2 (two) times daily. Qty: 10 capsule, Refills: 0    FLUoxetine (PROZAC) 10 MG capsule Take 1 capsule (10 mg total) by mouth daily. Qty: 90 capsule, Refills: 3    ondansetron (ZOFRAN) 4 MG tablet Take 1 tablet (4 mg total) by mouth every 8 (eight) hours as needed for nausea or  vomiting. Qty: 10 tablet, Refills: 0    rOPINIRole (REQUIP) 1 MG tablet Take 1 mg by mouth at bedtime.    tiotropium (SPIRIVA) 18 MCG inhalation capsule Place 18 mcg into inhaler and inhale daily.    isosorbide mononitrate (IMDUR) 30 MG 24 hr tablet Take 1 tablet (30 mg total) by mouth daily. Qty: 30 tablet, Refills: 0    lisinopril-hydrochlorothiazide (PRINZIDE,ZESTORETIC) 20-12.5 MG per tablet TAKE 1 TABLET BY MOUTH DAILY. Qty: 30 tablet, Refills: 6    meloxicam (MOBIC) 15 MG tablet TAKE 1 TABLET BY MOUTH EVERY DAY Qty: 30 tablet, Refills: 3    oxyCODONE (ROXICODONE) 15 MG immediate release tablet Take 1 tablet by mouth 4 (four) times daily.    pantoprazole (PROTONIX) 40 MG tablet TAKE 1 TABLET (40 MG TOTAL) BY MOUTH DAILY. Qty: 30 tablet, Refills: 6    polyethylene glycol (MIRALAX / GLYCOLAX) packet Take 17 g by mouth  daily as needed (for constipation).    PROAIR HFA 108 (90 BASE) MCG/ACT inhaler INHALE 2 PUFFS INTO THE LUNGS EVERY 6 (SIX) HOURS AS NEEDED FOR WHEEZING. Qty: 22.5 Inhaler, Refills: 1    promethazine (PHENERGAN) 12.5 MG tablet Take 2 tablets (25 mg total) by mouth every 6 (six) hours as needed for nausea or vomiting. Qty: 10 tablet, Refills: 0      STOP taking these medications     gabapentin (NEURONTIN) 400 MG capsule      metoprolol (LOPRESSOR) 100 MG tablet      omega-3 acid ethyl esters (LOVAZA) 1 G capsule           Management plans discussed with the patient and he is in agreement. Stable for discharge home  Patient should follow up with pcp and urology  CODE STATUS:     Code Status Orders        Start     Ordered   02/19/17 1942  Full code  Continuous     02/19/17 1942    Code Status History    Date Active Date Inactive Code Status Order ID Comments User Context   05/08/2016 10:01 AM 05/08/2016  4:55 PM Full Code [id number]  Harrie Foreman, MD Inpatient   10/15/2014  1:13 PM 10/20/2014  4:49 PM Full Code [id number]  Elam Dutch,  MD Inpatient   09/12/2014  9:29 AM 09/12/2014  4:09 PM Full Code [id number]  Josue Hector, MD Inpatient   12/18/2013  2:29 PM 12/26/2013  2:29 PM Full Code [id number]  Charlie Pitter, MD Inpatient   09/29/2012  9:51 AM 09/29/2012  5:27 PM Full Code [id number]  Alvina Chou, PA-C ED   09/29/2012  8:20 AM 09/29/2012  9:51 AM Full Code [id number]  Carlisle Cater, PA ED      TOTAL TIME TAKING CARE OF THIS PATIENT: 37 minutes.    Note: This dictation was prepared with Dragon dictation along with smaller phrase technology. Any transcriptional errors that result from this process are unintentional.  Gabrial Poppell M.D on 02/20/2017 at 8:34 AM  Between 7am to 6pm - Pager - [phone] After 6pm go to [email or web address] - password [email or web address]  Office  [phone]  CC: Primary care physician; Curly Rim, MD

## 2017-02-20 NOTE — Consult Note (Signed)
Surgical Consultation  02/20/2017  Bradley Sawyer is an 61 y.o. male.   CC: Left testicular pain  HPI: Patient came to the hospital with the acute onset of hematuria. He had hematuria for the last 3 days and has started having pain approximately 2 days ago mostly in his left testicle. Recent left inguinal hernia repair 6 months ago and [place]. wAs performed via an open technique. Patient describes hematuria testicular pain no nausea vomiting no fevers or chills  He is disabled from chronic back pain he sees a pain specialist for pain management. He has a history of narcotics use. He has had a stroke in the past but is not anticoagulated other than aspirin.  He has abused tobacco. Family history as below.  Past Medical History:  Diagnosis Date  . Anxiety   . Arthritis   . 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 Cocaine dependence    Last use 1998, attends 3 NA meetings weekly.  . Compression fracture    L2  . COPD (chronic obstructive pulmonary disease) (HCC)    bronchitis  . CVA (cerebral infarction)   . Disorder of vocal cord   . Fatty liver   . GERD (gastroesophageal reflux disease)   . Hx of cardiovascular stress test    ETT/Lexiscan-Myoview (10/15):  Inferior, inferoapical, apical lateral defect c/w mild ischemia, EF 66%; Intermediate Risk  . Hyperlipidemia   . Hypertension   . Right shoulder pain    2/2 partial rotator cuff tear, tendinopathy, mild subacromial/subdeltioid bursitis, A/C joint arthropathy per MRI done in 3/07  . Tobacco abuse     Past Surgical History:  Procedure Laterality Date  . ANTERIOR LAT LUMBAR FUSION N/A 12/18/2013   Procedure: Lumbar Two Anteriorlateral Corpectomy w/ cage, interbody fusion, anterior plating, Lumbar One to Lumbar Three percutaneous pedicle screws;  Surgeon: Charlie Pitter, MD;  Location: [place] [facility]URO ORS;  Service: Neurosurgery;  Laterality: N/A;  . AORTA -INNOMIATE BYPASS N/A 10/15/2014   Procedure:  ENDARTERCTOMY OF  INNOMINATE ARTERY ;  Surgeon: Elam Dutch, MD;  Location: [place];  Service: Vascular;  Laterality: N/A;  . ARCH AORTOGRAM N/A 08/23/2014   Procedure: ARCH AORTOGRAM;  Surgeon: Elam Dutch, MD;  Location: [facility] CATH LAB;  Service: Cardiovascular;  Laterality: N/A;  . arch aortogram, left carotid,left subclavian angiogram  08-23-2014  . CAROTID ANGIOGRAM Left 08/23/2014   Procedure: CAROTID ANGIOGRAM;  Surgeon: Elam Dutch, MD;  Location: [facility] CATH LAB;  Service: Cardiovascular;  Laterality: Left;  . ENDARTERECTOMY Right 10/15/2014   Procedure: ENDARTERECTOMY RIGHT SUBCLAVIAN ARTERY;  Surgeon: Elam Dutch, MD;  Location: [place];  Service: Vascular;  Laterality: Right;  . ENDARTERECTOMY Right 10/15/2014   Procedure: ENDARTERECTOMY RIGHT COMMON CAROTID ARTERY;  Surgeon: Elam Dutch, MD;  Location: [place];  Service: Vascular;  Laterality: Right;  . LEFT HEART CATHETERIZATION WITH CORONARY ANGIOGRAM N/A 09/12/2014   Procedure: LEFT HEART CATHETERIZATION WITH CORONARY ANGIOGRAM;  Surgeon: Josue Hector, MD;  Location: [facility] CATH LAB;  Service: Cardiovascular;  Laterality: N/A;  . LUMBAR SPINE SURGERY  12/18/2013   L2     DR POOL  . NECK SURGERY    . PATCH ANGIOPLASTY Right 10/15/2014   Procedure: PATCH ANGIOPLASTY OF RIGHT SUBCLAVIAN ARTERY , RIGHT COMMON CAROTID ARTERY & INNOMINATE ARTERY;  Surgeon: Elam Dutch, MD;  Location: [place];  Service: Vascular;  Laterality: Right;  . STERNOTOMY N/A 10/15/2014   Procedure: PARTIAL STERNOTOMY & PLATING OF STERNUM;  Surgeon:  Elam Dutch, MD;  Location: [place];  Service: Vascular;  Laterality: N/A;  . TEE WITHOUT CARDIOVERSION N/A 12/25/2013   Procedure: TRANSESOPHAGEAL ECHOCARDIOGRAM (TEE);  Surgeon: Larey Dresser, MD;  Location: [facility] ENDOSCOPY;  Service: Cardiovascular;  Laterality: N/A;    Family History  Problem Relation Age of Onset  . Heart disease Father   . Heart attack Father   . Diabetes Paternal Grandmother      1 st degree relatives  . Hypertension Paternal Grandmother   . Diabetes Brother   . Heart disease Brother   . Muscular dystrophy Brother   . Muscular dystrophy Sister     Social History:  reports that he has been smoking Cigarettes.  He has a 48.00 pack-year smoking history. He has never used smokeless tobacco. He reports that he does not drink alcohol or use drugs.  Allergies: No Known Allergies  Medications reviewed.   Review of Systems:   Review of Systems  Constitutional: Negative for chills and fever.  HENT: Negative.   Eyes: Negative.   Respiratory: Negative.   Cardiovascular: Negative.   Gastrointestinal: Positive for heartburn. Negative for abdominal pain, nausea and vomiting.  Genitourinary: Positive for dysuria and hematuria. Negative for flank pain.  Musculoskeletal: Negative.   Skin: Negative.   Neurological: Negative.   Endo/Heme/Allergies: Negative.   Psychiatric/Behavioral: Negative.      Physical Exam:  BP (!) 149/79   Pulse 76   Temp 99.9 F (37.7 C) (Oral)   Resp (!) 24   Ht '5\' 11"'  (1.803 m)   Wt 202 lb 9.6 oz (91.9 kg)   SpO2 90%   BMI 28.26 kg/m   Physical Exam  Constitutional: He is oriented to person, place, and time and well-developed, well-nourished, and in no distress. No distress.  Moves about the room without difficulty  HENT:  Head: Normocephalic and atraumatic.  Eyes: Pupils are equal, round, and reactive to light. Right eye exhibits no discharge. Left eye exhibits no discharge. No scleral icterus.  Neck: Normal range of motion.  Cardiovascular: Normal rate and regular rhythm.   Pulmonary/Chest: Effort normal. No respiratory distress.  Abdominal: Soft. He exhibits no distension. There is no tenderness.  Genitourinary: No discharge found.  Genitourinary Comments: Recent left groin scar which is healing well. There is swelling beneath it but there is no cellulitis and nontender. Left hydrocele with a tender testicle no scrotal  cellulitis noted Right testicle normal with small hydrocele palpable  Musculoskeletal: Normal range of motion. He exhibits no edema or tenderness.  Lymphadenopathy:    He has no cervical adenopathy.  Neurological: He is alert and oriented to person, place, and time.  Skin: Skin is warm and dry. No rash noted. He is not diaphoretic. No erythema.  Vitals reviewed.     Results for orders placed or performed during the hospital encounter of 02/19/17 (from the past 48 hour(s))  Basic metabolic panel     Status: Abnormal   Collection Time: 02/19/17  2:59 PM  Result Value Ref Range   Sodium 136 135 - 145 mmol/L   Potassium 3.2 (L) 3.5 - 5.1 mmol/L   Chloride 99 (L) 101 - 111 mmol/L   CO2 28 22 - 32 mmol/L   Glucose, Bld 99 65 - 99 mg/dL   BUN 20 6 - 20 mg/dL   Creatinine, Ser 0.93 0.61 - 1.24 mg/dL   Calcium 8.9 8.9 - 10.3 mg/dL   GFR calc non Af Amer >60 >60 mL/min   GFR calc  Af Amer >60 >60 mL/min    Comment: (NOTE) The eGFR has been calculated using the CKD EPI equation. This calculation has not been validated in all clinical situations. eGFR's persistently <60 mL/min signify possible Chronic Kidney Disease.    Anion gap 9 5 - 15  CBC with Differential     Status: Abnormal   Collection Time: 02/19/17  2:59 PM  Result Value Ref Range   WBC 18.0 (H) 3.8 - 10.6 K/uL   RBC 4.10 (L) 4.40 - 5.90 MIL/uL   Hemoglobin 12.6 (L) 13.0 - 18.0 g/dL   HCT 37.4 (L) 40.0 - 52.0 %   MCV 91.2 80.0 - 100.0 fL   MCH 30.7 26.0 - 34.0 pg   MCHC 33.7 32.0 - 36.0 g/dL   RDW 13.1 11.5 - 14.5 %   Platelets 168 150 - 440 K/uL   Neutrophils Relative % 75 %   Neutro Abs 13.5 (H) 1.4 - 6.5 K/uL   Lymphocytes Relative 17 %   Lymphs Abs 3.0 1.0 - 3.6 K/uL   Monocytes Relative 7 %   Monocytes Absolute 1.2 (H) 0.2 - 1.0 K/uL   Eosinophils Relative 0 %   Eosinophils Absolute 0.1 0 - 0.7 K/uL   Basophils Relative 1 %   Basophils Absolute 0.1 0 - 0.1 K/uL  Urinalysis, Complete w Microscopic     Status:  Abnormal   Collection Time: 02/19/17  2:59 PM  Result Value Ref Range   Color, Urine YELLOW (A) YELLOW   APPearance HAZY (A) CLEAR   Specific Gravity, Urine 1.016 1.005 - 1.030   pH 5.0 5.0 - 8.0   Glucose, UA NEGATIVE NEGATIVE mg/dL   Hgb urine dipstick MODERATE (A) NEGATIVE   Bilirubin Urine NEGATIVE NEGATIVE   Ketones, ur NEGATIVE NEGATIVE mg/dL   Protein, ur NEGATIVE NEGATIVE mg/dL   Nitrite NEGATIVE NEGATIVE   Leukocytes, UA MODERATE (A) NEGATIVE   RBC / HPF 6-30 0 - 5 RBC/hpf   WBC, UA TOO NUMEROUS TO COUNT 0 - 5 WBC/hpf   Bacteria, UA RARE (A) NONE SEEN   Squamous Epithelial / LPF 0-5 (A) NONE SEEN   Mucous PRESENT   Chlamydia/NGC rt PCR ([facility] only)     Status: None   Collection Time: 02/19/17  2:59 PM  Result Value Ref Range   Specimen source GC/Chlam URINE, RANDOM    Chlamydia Tr NOT DETECTED NOT DETECTED   N gonorrhoeae NOT DETECTED NOT DETECTED    Comment: (NOTE) 100  This methodology has not been evaluated in pregnant women or in 200  patients with a history of hysterectomy. 300 400  This methodology will not be performed on patients less than 69  years of age.   Basic metabolic panel     Status: Abnormal   Collection Time: 02/20/17  3:26 AM  Result Value Ref Range   Sodium 136 135 - 145 mmol/L   Potassium 3.9 3.5 - 5.1 mmol/L   Chloride 100 (L) 101 - 111 mmol/L   CO2 30 22 - 32 mmol/L   Glucose, Bld 97 65 - 99 mg/dL   BUN 20 6 - 20 mg/dL   Creatinine, Ser 1.07 0.61 - 1.24 mg/dL   Calcium 9.0 8.9 - 10.3 mg/dL   GFR calc non Af Amer >60 >60 mL/min   GFR calc Af Amer >60 >60 mL/min    Comment: (NOTE) The eGFR has been calculated using the CKD EPI equation. This calculation has not been validated in all clinical situations. eGFR's  persistently <60 mL/min signify possible Chronic Kidney Disease.    Anion gap 6 5 - 15  CBC     Status: Abnormal   Collection Time: 02/20/17  3:26 AM  Result Value Ref Range   WBC 15.1 (H) 3.8 - 10.6 K/uL   RBC 4.17 (L) 4.40  - 5.90 MIL/uL   Hemoglobin 13.0 13.0 - 18.0 g/dL   HCT 38.0 (L) 40.0 - 52.0 %   MCV 91.3 80.0 - 100.0 fL   MCH 31.2 26.0 - 34.0 pg   MCHC 34.1 32.0 - 36.0 g/dL   RDW 13.8 11.5 - 14.5 %   Platelets 162 150 - 440 K/uL   US Scrotum  Result Date: 02/19/2017 CLINICAL DATA:  Left testicular swelling, redness, pain. Gross hematuria x3 days. EXAM: SCROTAL ULTRASOUND DOPPLER ULTRASOUND OF THE TESTICLES TECHNIQUE: Complete ultrasound examination of the testicles, epididymis, and other scrotal structures was performed. Color and spectral Doppler ultrasound were also utilized to evaluate blood flow to the testicles. COMPARISON:  05/11/2010 FINDINGS: Right testicle Measurements: 4.8 x 2.6 x 3.4 cm . No mass or microlithiasis visualized. Left testicle Measurements: 4.5 x 2.9 x 3.5 cm. No mass or microlithiasis visualized. Right epididymis: 0.6 x 0.7 cm cyst in the epididymal head. Otherwise Normal in size and appearance. Left epididymis: 0.6 x 0.5 cm cyst in the epididymal head. Body of the epididymis is heterogeneous and enlarged without hyperemia. No focal mass. Hydrocele:  Small, bilateral. Varicocele:  None visualized. Pulsed Doppler interrogation of both testes demonstrates normal low resistance arterial and venous waveforms bilaterally. Moderate scrotal skin thickening left greater than right. IMPRESSION: 1. Negative for testicular mass or torsion. 2. Small bilateral hydroceles. 3. Small epididymal cysts bilaterally. 4. No hyperemia to suggest epididymo-orchitis. Electronically Signed   By: Lucrezia Europe M.D.   On: 02/19/2017 15:01   Ct Abdomen Pelvis W Contrast  Result Date: 02/19/2017 CLINICAL DATA:  Blood in urine followed by LEFT testicular swelling last night, history hypertension, COPD, smoking, question kidney stone history EXAM: CT ABDOMEN AND PELVIS WITH CONTRAST TECHNIQUE: Multidetector CT imaging of the abdomen and pelvis was performed using the standard protocol following bolus administration of  intravenous contrast. CONTRAST:  160m ISOVUE-300 IOPAMIDOL (ISOVUE-300) INJECTION 61% COMPARISON:  02/29/2016 FINDINGS: Lower chest: Minimal dependent atelectasis at lung bases. Gallbladder surgically absent. Liver unremarkable. Hepatobiliary: Gallbladder surgically absent.  Liver unremarkable. Pancreas: Normal appearance Spleen: Normal appearance Adrenals/Urinary Tract: Small cyst at inferior pole RIGHT kidney 2.2 x 1.7 cm image 41. Adrenal glands, kidneys, ureters, and bladder otherwise normal appearance. Stomach/Bowel: Normal appendix. Sigmoid diverticulosis. Stomach incompletely distended unable to exclude proximal gastric wall thickening. Remaining bowel loops unremarkable. Vascular/Lymphatic: Atherosclerotic calcifications aorta and coronary arteries. Infrarenal aorta demonstrates mild aneurysmal dilatation to a maximum size of 3.2 x 2.9 cm image 36. Additional atherosclerotic calcifications in the iliac and femoral vessels. No adenopathy. Reproductive: Minimal prostatic enlargement with scattered prostatic calcifications. Seminal vesicles unremarkable. Other: BILATERAL inguinal hernias containing fat, larger on LEFT, with associated mild infiltrative changes question of prior hernia repair. LEFT flank hernia with protrusion of fat through the defect. Minimal posterior displacement of the LEFT kidney without renal or bowel herniation. Musculoskeletal: Prior lumbar fusion and strut graft L1-L3. Osseous demineralization. No acute bony findings. IMPRESSION: Sigmoid diverticulosis without evidence of diverticulitis. 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 BILATERAL inguinal hernias and LEFT flank hernia containing fat. Unable to exclude proximal gastric wall thickening though this  could be an artifact related underdistention ; consider followup endoscopy or upper GI exam to assess. Electronically Signed   By: Lavonia Dana M.D.   On:  02/19/2017 17:01   [place] Art/ven Flow Abd Pelv Doppler  Result Date: 02/19/2017 CLINICAL DATA:  Left testicular swelling, redness, pain. Gross hematuria x3 days. EXAM: SCROTAL ULTRASOUND DOPPLER ULTRASOUND OF THE TESTICLES TECHNIQUE: Complete ultrasound examination of the testicles, epididymis, and other scrotal structures was performed. Color and spectral Doppler ultrasound were also utilized to evaluate blood flow to the testicles. COMPARISON:  05/11/2010 FINDINGS: Right testicle Measurements: 4.8 x 2.6 x 3.4 cm . No mass or microlithiasis visualized. Left testicle Measurements: 4.5 x 2.9 x 3.5 cm. No mass or microlithiasis visualized. Right epididymis: 0.6 x 0.7 cm cyst in the epididymal head. Otherwise Normal in size and appearance. Left epididymis: 0.6 x 0.5 cm cyst in the epididymal head. Body of the epididymis is heterogeneous and enlarged without hyperemia. No focal mass. Hydrocele:  Small, bilateral. Varicocele:  None visualized. Pulsed Doppler interrogation of both testes demonstrates normal low resistance arterial and venous waveforms bilaterally. Moderate scrotal skin thickening left greater than right. IMPRESSION: 1. Negative for testicular mass or torsion. 2. Small bilateral hydroceles. 3. Small epididymal cysts bilaterally. 4. No hyperemia to suggest epididymo-orchitis. Electronically Signed   By: Lucrezia Europe M.D.   On: 02/19/2017 15:01    Assessment/Plan:  I was asked see the patient for scrotal pain and tenderness and possible cellulitis in a patient with an obvious UTI and hematuria. I have reviewed his studies personally. At this point he has a recurrent inguinal hernia on the left and a untreated right inguinal hernia neither of which are tender neither of which require urgent investigation. Nor did they require urgent repair. He does have a left hydrocele and some left-sided tenderness I do not see signs of epididymitis. Patient is best treated with IV antibiotics for his UTI and follow-up  with a urologist at some point for the hydrocele. He can follow up with his surgeon for the recurrence of his left inguinal hernia in [place].  Florene Glen, MD, FACS

## 2017-02-20 NOTE — Progress Notes (Signed)
PHARMACY - PHYSICIAN COMMUNICATION CRITICAL VALUE ALERT - BLOOD CULTURE IDENTIFICATION (BCID)  Results for orders placed or performed during the hospital encounter of 02/19/17  Blood Culture ID Panel (Reflexed) (Collected: 02/19/2017  4:26 PM)  Result Value Ref Range   Enterococcus species NOT DETECTED NOT DETECTED   Listeria monocytogenes NOT DETECTED NOT DETECTED   Staphylococcus species NOT DETECTED NOT DETECTED   Staphylococcus aureus NOT DETECTED NOT DETECTED   Streptococcus species NOT DETECTED NOT DETECTED   Streptococcus agalactiae NOT DETECTED NOT DETECTED   Streptococcus pneumoniae NOT DETECTED NOT DETECTED   Streptococcus pyogenes NOT DETECTED NOT DETECTED   Acinetobacter baumannii NOT DETECTED NOT DETECTED   Enterobacteriaceae species DETECTED (A) NOT DETECTED   Enterobacter cloacae complex NOT DETECTED NOT DETECTED   Escherichia coli DETECTED (A) NOT DETECTED   Klebsiella oxytoca NOT DETECTED NOT DETECTED   Klebsiella pneumoniae NOT DETECTED NOT DETECTED   Proteus species NOT DETECTED NOT DETECTED   Serratia marcescens NOT DETECTED NOT DETECTED   Carbapenem resistance NOT DETECTED NOT DETECTED   Haemophilus influenzae NOT DETECTED NOT DETECTED   Neisseria meningitidis NOT DETECTED NOT DETECTED   Pseudomonas aeruginosa NOT DETECTED NOT DETECTED   Candida albicans NOT DETECTED NOT DETECTED   Candida glabrata NOT DETECTED NOT DETECTED   Candida krusei NOT DETECTED NOT DETECTED   Candida parapsilosis NOT DETECTED NOT DETECTED   Candida tropicalis NOT DETECTED NOT DETECTED    Name of physician (or Provider) Contacted: Mody  Changes to prescribed antibiotics required: Patient discharged this morning with levofloxacin 500mg  PO x 5 days for epididymitis. Discussed culture results with Dr. Benjie Karvonen - does not want patient to return of IV antibiotics at this time. Requests pharmacist to follow up on final culture results and contact her tomorrow. Will pass off to clinical pharmacist  for tomorrow.   Cleotilde Spadaccini C 02/20/2017  2:45 PM

## 2017-02-20 NOTE — Progress Notes (Signed)
Pt requesting to take all of his meds once he gets home as he has been discharged

## 2017-02-20 NOTE — Progress Notes (Signed)
Pt A and O x 4. VSS. Pt tolerating diet well. No complaints of pain or nausea. IV removed intact, prescriptions given. Pt voiced understanding of discharge instructions with no further questions. Pt sounded uninterested in discharge instruction and said he would read it at home. Pt discharged via wheelchair with nurse aide to drive himself home.

## 2017-02-21 LAB — URINE CULTURE

## 2017-02-21 LAB — HIV ANTIBODY (ROUTINE TESTING W REFLEX): HIV Screen 4th Generation wRfx: NONREACTIVE

## 2017-02-21 NOTE — Progress Notes (Signed)
Blood culture follow up  Spoke with MD Mody about sensitivities of Urine culture which indicates the Ecoli is resistant to Ciprofloxacin.   Urine culture [[id number]] (Abnormal)  Collected: 02/19/17 1459  Order Status: Completed Specimen: Urine from Urine, Clean Catch Updated: 02/21/17 0907   Specimen Description URINE, CLEAN CATCH   Special Requests NONE   Culture 60,000 COLONIES/mL ESCHERICHIA COLI (A)   Report Status 02/21/2017 FINAL   Organism ID, Bacteria ESCHERICHIA COLI (A)  Culture & Susceptibility   ESCHERICHIA COLI   Antibiotic Sensitivity Microscan Status  AMPICILLIN Resistant >=32 RESISTANT Final  Method: MIC  AMPICILLIN/SULBACTAM Resistant >=32 RESISTANT Final  Method: MIC  CEFAZOLIN Sensitive 8 SENSITIVE Final  Method: MIC  CEFTRIAXONE Sensitive <=1 SENSITIVE Final  Method: MIC  CIPROFLOXACIN Resistant >=4 RESISTANT Final  Method: MIC  Extended ESBL Sensitive NEGATIVE Final  Method: MIC  GENTAMICIN Resistant >=16 RESISTANT Final  Method: MIC  IMIPENEM Sensitive <=0.25 SENSITIVE Final  Method: MIC  NITROFURANTOIN Sensitive <=16 SENSITIVE Final  Method: MIC  PIP/TAZO Intermediate 64 INTERMEDIATE Final  Method: MIC  TRIMETH/SULFA Sensitive <=20 SENSITIVE Final  Method: MIC  Comments ESCHERICHIA COLI (MIC)   60,000 COLONIES/mL ESCHERICHIA COLI            MD thinks sensitivity pattern likely the same for blood culture; therefore MD Mody will contact patient and prescribe an oral antimicrobial for the patient.   Larene Beach, PharmD

## 2017-02-21 NOTE — Progress Notes (Signed)
Patient has E coli in UA and blood. He saw PCP today and doing well without fevers. However due to bactemia. Will treat per sensitivities with KEFLEX . E coli Rest to cipro.  I called in keflex to [employer]. Patient will pick up R today and should cont LEvaquin for epidydmitis.

## 2017-02-22 LAB — CULTURE, BLOOD (ROUTINE X 2)
Special Requests: ADEQUATE
Special Requests: ADEQUATE

## 2017-02-23 ENCOUNTER — Encounter: Payer: Self-pay | Admitting: Family

## 2017-03-03 ENCOUNTER — Encounter (HOSPITAL_COMMUNITY): Payer: Medicaid Other

## 2017-03-03 ENCOUNTER — Ambulatory Visit: Payer: Medicaid Other | Admitting: Family

## 2017-03-06 NOTE — Progress Notes (Signed)
03/07/2017 9:45 AM   Bradley Sawyer [birth year] [id number]  Referring provider: Curly Rim, MD [facility] [address] [place] [address]  Chief Complaint  Patient presents with  . New Patient (Initial Visit)    ER follow up swollen L testicle    HPI: Patient is a 61 year old Caucasian male who presents today as follow-up after hospitalized for left epididymitis and gross hematuria.  Patient was seen as consult by Dr. Junious Silk while in the hospital on 02/19/2017.  At that time, the patient has had a 3 day history of red urine, no clots. He is a smoker. Then yesterday the left testicle became painful and gradually swelled over the past 2 days. He had a white count of 18, MAXIMUM TEMPERATURE of 99, UA with too numerous to count white cells and a few red cells and rare bacteria. He underwent a scrotal ultrasound which revealed no mass or torsion of the testicles. Blood flow seemed symmetric with left scrotal wall thickening. A CT scan with contrast was obtained although the delays stop halfway down with no delays through the distal ureters or the bladder. Findings included a left retroperitoneal hernia, bilateral "hernias containing fat" with infiltrative changes on the left (likely postop from October 2017 hernia repair-care everywhere). The prostate appeared normal. Possibly some irregularity of the right posterior bladder wall. The scan went all the way through the scrotum with out evidence of air or abscess in the scrotum. The patient reports he has no dysuria and is voiding with a good stream. He feels like his urine looks clear tonight.  The patient had a PSA of 0.4 in 2016.  Today, he is experiencing nocturia.  He denies dysuria, gross hematuria and suprapubic pain. He also states he had not had fevers, chills, nausea or vomiting.  He is was given Levaquin and Cleocin.    His blood cultures were positive for staphylococcus aureus, enterobacteriaceae and E. Coli. Urine culture  was positive for E. Coli.   He states that he is still experiencing pain in the left scrotal.  He says it is ten times worse than being kicked in the groin.  The swelling in down 20 %.   His UA is unremarkable.     PMH: Past Medical History:  Diagnosis Date  . Anxiety   . Arthritis   . 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 Cocaine dependence    Last use 1998, attends 3 NA meetings weekly.  . Compression fracture    L2  . COPD (chronic obstructive pulmonary disease) (HCC)    bronchitis  . CVA (cerebral infarction)   . Disorder of vocal cord   . Fatty liver   . GERD (gastroesophageal reflux disease)   . Hx of cardiovascular stress test    ETT/Lexiscan-Myoview (10/15):  Inferior, inferoapical, apical lateral defect c/w mild ischemia, EF 66%; Intermediate Risk  . Hyperlipidemia   . Hypertension   . Right shoulder pain    2/2 partial rotator cuff tear, tendinopathy, mild subacromial/subdeltioid bursitis, A/C joint arthropathy per MRI done in 3/07  . Tobacco abuse     Surgical History: Past Surgical History:  Procedure Laterality Date  . ANTERIOR LAT LUMBAR FUSION N/A 12/18/2013   Procedure: Lumbar Two Anteriorlateral Corpectomy w/ cage, interbody fusion, anterior plating, Lumbar One to Lumbar Three percutaneous pedicle screws;  Surgeon: Charlie Pitter, MD;  Location: [place] [facility];  Service: Neurosurgery;  Laterality: N/A;  . AORTA -INNOMIATE BYPASS N/A 10/15/2014  Procedure: ENDARTERCTOMY OF  INNOMINATE ARTERY ;  Surgeon: Elam Dutch, MD;  Location: [place];  Service: Vascular;  Laterality: N/A;  . ARCH AORTOGRAM N/A 08/23/2014   Procedure: ARCH AORTOGRAM;  Surgeon: Elam Dutch, MD;  Location: [facility] CATH LAB;  Service: Cardiovascular;  Laterality: N/A;  . arch aortogram, left carotid,left subclavian angiogram  08-23-2014  . CAROTID ANGIOGRAM Left 08/23/2014   Procedure: CAROTID ANGIOGRAM;  Surgeon: Elam Dutch, MD;  Location: [facility] CATH LAB;  Service:  Cardiovascular;  Laterality: Left;  . CHOLECYSTECTOMY  12/2016  . ENDARTERECTOMY Right 10/15/2014   Procedure: ENDARTERECTOMY RIGHT SUBCLAVIAN ARTERY;  Surgeon: Elam Dutch, MD;  Location: [place];  Service: Vascular;  Laterality: Right;  . ENDARTERECTOMY Right 10/15/2014   Procedure: ENDARTERECTOMY RIGHT COMMON CAROTID ARTERY;  Surgeon: Elam Dutch, MD;  Location: [place];  Service: Vascular;  Laterality: Right;  . 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 LEFT HEART CATHETERIZATION WITH CORONARY ANGIOGRAM N/A 09/12/2014   Procedure: LEFT HEART CATHETERIZATION WITH CORONARY ANGIOGRAM;  Surgeon: Josue Hector, MD;  Location: [facility] CATH LAB;  Service: Cardiovascular;  Laterality: N/A;  . LUMBAR SPINE SURGERY  12/18/2013   L2     DR POOL  . NECK SURGERY    . PATCH ANGIOPLASTY Right 10/15/2014   Procedure: PATCH ANGIOPLASTY OF RIGHT SUBCLAVIAN ARTERY , RIGHT COMMON CAROTID ARTERY & INNOMINATE ARTERY;  Surgeon: Elam Dutch, MD;  Location: [place];  Service: Vascular;  Laterality: Right;  . STERNOTOMY N/A 10/15/2014   Procedure: PARTIAL STERNOTOMY & PLATING OF STERNUM;  Surgeon: Elam Dutch, MD;  Location: [place];  Service: Vascular;  Laterality: N/A;  . TEE WITHOUT CARDIOVERSION N/A 12/25/2013   Procedure: TRANSESOPHAGEAL ECHOCARDIOGRAM (TEE);  Surgeon: Larey Dresser, MD;  Location: [place];  Service: Cardiovascular;  Laterality: N/A;    Home Medications:  Allergies as of 03/07/2017   No Known Allergies     Medication List       Accurate as of 03/07/17  9:45 AM. Always use your most recent med list.          ADVAIR DISKUS 500-50 MCG/DOSE Aepb Generic drug:  Fluticasone-Salmeterol INHALE 1 PUFF INTO THE LUNGS EVERY 12 (TWELVE) HOURS.   amLODipine 5 MG tablet Commonly known as:  NORVASC Take by mouth.   aspirin EC 81 MG tablet Take 81 mg by mouth daily.   atorvastatin 10 MG tablet Commonly known as:  LIPITOR Take 1 tablet (10 mg total) by mouth daily.     benazepril 40 MG tablet Commonly known as:  LOTENSIN Take by mouth.   docusate sodium 100 MG capsule Commonly known as:  COLACE Take 1 capsule (100 mg total) by mouth 2 (two) times daily.   FLUoxetine 10 MG capsule Commonly known as:  PROZAC Take 1 capsule (10 mg total) by mouth daily.   isosorbide mononitrate 30 MG 24 hr tablet Commonly known as:  IMDUR Take 1 tablet (30 mg total) by mouth daily.   lisinopril-hydrochlorothiazide 20-12.5 MG tablet Commonly known as:  PRINZIDE,ZESTORETIC TAKE 1 TABLET BY MOUTH DAILY.   lubiprostone 24 MCG capsule Commonly known as:  AMITIZA Take by mouth.   meloxicam 15 MG tablet Commonly known as:  MOBIC TAKE 1 TABLET BY MOUTH EVERY DAY   metoprolol 100 MG tablet Commonly known as:  LOPRESSOR Take by mouth.   MULTI-VITAMINS Tabs Take by mouth.   NARCAN 4 MG/0.1ML Liqd nasal spray kit Generic drug:  naloxone INHALE  ONE SPRAY EACH NOSTRIL FOR OPIOID EMERGENCY   nitroGLYCERIN 0.4 MG SL tablet Commonly known as:  NITROSTAT Place 0.4 mg under the tongue.   omega-3 acid ethyl esters 1 g capsule Commonly known as:  LOVAZA TAKE 2 CAPSULES (2 G TOTAL) BY MOUTH 2 (TWO) TIMES DAILY.   ondansetron 4 MG tablet Commonly known as:  ZOFRAN Take 1 tablet (4 mg total) by mouth every 8 (eight) hours as needed for nausea or vomiting.   oxyCODONE 15 MG immediate release tablet Commonly known as:  ROXICODONE Take by mouth.   oxyCODONE-acetaminophen 10-325 MG tablet Commonly known as:  PERCOCET Take by mouth.   pantoprazole 40 MG tablet Commonly known as:  PROTONIX TAKE 1 TABLET (40 MG TOTAL) BY MOUTH DAILY.   polyethylene glycol packet Commonly known as:  MIRALAX / GLYCOLAX Take 17 g by mouth daily as needed (for constipation).   albuterol 1.25 MG/3ML nebulizer solution Commonly known as:  ACCUNEB Take 3 mLs by nebulization every 6 (six) hours as needed.   PROAIR HFA 108 (90 Base) MCG/ACT inhaler Generic drug:  albuterol INHALE 2  PUFFS INTO THE LUNGS EVERY 6 (SIX) HOURS AS NEEDED FOR WHEEZING.   promethazine 12.5 MG tablet Commonly known as:  PHENERGAN Take 2 tablets (25 mg total) by mouth every 6 (six) hours as needed for nausea or vomiting.   rOPINIRole 1 MG tablet Commonly known as:  REQUIP Take 1 mg by mouth at bedtime.   tiotropium 18 MCG inhalation capsule Commonly known as:  SPIRIVA Place 18 mcg into inhaler and inhale daily.       Allergies: No Known Allergies  Family History: Family History  Problem Relation Age of Onset  . Heart disease Father   . Heart attack Father   . Diabetes Paternal Grandmother     1 st degree relatives  . Hypertension Paternal Grandmother   . Diabetes Brother   . Heart disease Brother   . Muscular dystrophy Brother   . Muscular dystrophy Sister   . Prostate cancer Neg Hx   . Kidney cancer Neg Hx   . Bladder Cancer Neg Hx     Social History:  reports that he has been smoking Cigarettes.  He has a 48.00 pack-year smoking history. He has never used smokeless tobacco. He reports that he does not drink alcohol or use drugs.  ROS: UROLOGY Frequent Urination?: No Hard to postpone urination?: Yes Burning/pain with urination?: No Get up at night to urinate?: No Leakage of urine?: No Urine stream starts and stops?: No Trouble starting stream?: No Do you have to strain to urinate?: No Blood in urine?: Yes Urinary tract infection?: Yes Sexually transmitted disease?: No Injury to kidneys or bladder?: No Painful intercourse?: No Weak stream?: No Erection problems?: No Penile pain?: No  Gastrointestinal Nausea?: Yes Vomiting?: Yes Indigestion/heartburn?: No Diarrhea?: No Constipation?: Yes  Constitutional Fever: Yes Night sweats?: Yes Weight loss?: Yes Fatigue?: No  Skin Skin rash/lesions?: No Itching?: No  Eyes Blurred vision?: No Double vision?: No  Ears/Nose/Throat Sore throat?: No Sinus problems?: No  Hematologic/Lymphatic Swollen  glands?: No Easy bruising?: Yes  Cardiovascular Leg swelling?: No Chest pain?: No  Respiratory Cough?: Yes Shortness of breath?: Yes  Endocrine Excessive thirst?: No  Musculoskeletal Back pain?: Yes Joint pain?: No  Neurological Headaches?: No Dizziness?: No  Psychologic Depression?: Yes Anxiety?: Yes  Physical Exam: BP 115/69   Pulse 61   Temp 97.8 F (36.6 C) (Oral)   Ht _0  (1.803 m)  Wt 198 lb (89.8 kg)   BMI 27.62 kg/m   Constitutional: Well nourished. Alert and oriented, No acute distress. HEENT: [place] AT, moist mucus membranes. Trachea midline, no masses. Cardiovascular: No clubbing, cyanosis, or edema. Respiratory: Normal respiratory effort, no increased work of breathing. GI: Abdomen is soft, non tender, non distended, no abdominal masses. Liver and spleen not palpable.  No hernias appreciated.  Stool sample for occult testing is not indicated.   GU: No CVA tenderness.  No bladder fullness or masses.  Patient with circumcised phallus.   Urethral meatus is patent.  No penile discharge. No penile lesions or rashes. Scrotum without lesions, cysts, rashes and/or edema.  Right testicle is located scrotally.  Left testicle cannot be palpated due to hydrocele.  Left hydrocele noted.  Left epididymal cyst is noted.  No mass is appreciated in right testicle. Right epididymis are normal. Rectal: Patient with  normal sphincter tone. Anus and perineum without scarring or rashes. No rectal masses are appreciated. Prostate is approximately 45 grams, no nodules are appreciated. Seminal vesicles are normal. Skin: No rashes, bruises or suspicious lesions. Lymph: No cervical or inguinal adenopathy. Neurologic: Grossly intact, no focal deficits, moving all 4 extremities. Psychiatric: Normal mood and affect.  Laboratory Data: Lab Results  Component Value Date   WBC 15.1 (H) 02/20/2017   HGB 13.0 02/20/2017   HCT 38.0 (L) 02/20/2017   MCV 91.3 02/20/2017   PLT 162  02/20/2017    Lab Results  Component Value Date   CREATININE 1.07 02/20/2017    Lab Results  Component Value Date   HGBA1C 5.8 05/08/2016    Lab Results  Component Value Date   TSH 0.449 05/08/2016       Component Value Date/Time   CHOL 179 02/11/2015 1413   HDL 36 (L) 02/11/2015 1413   CHOLHDL 5.0 02/11/2015 1413   VLDL NOT CALC 02/11/2015 1413   LDLCALC NOT CALC 02/11/2015 1413    Lab Results  Component Value Date   AST 17 02/29/2016   Lab Results  Component Value Date   ALT 23 02/29/2016    Urinalysis Unremarkable.  See EPIC.   Pertinent Imaging: CLINICAL DATA:  Blood in urine followed by LEFT testicular swelling last night, history hypertension, COPD, smoking, question kidney stone history  EXAM: CT ABDOMEN AND PELVIS WITH CONTRAST  TECHNIQUE: Multidetector CT imaging of the abdomen and pelvis was performed using the standard protocol following bolus administration of intravenous contrast.  CONTRAST:  147m ISOVUE-300 IOPAMIDOL (ISOVUE-300) INJECTION 61%  COMPARISON:  02/29/2016  FINDINGS: Lower chest: Minimal dependent atelectasis at lung bases. Gallbladder surgically absent. Liver unremarkable.  Hepatobiliary: Gallbladder surgically absent.  Liver unremarkable.  Pancreas: Normal appearance  Spleen: Normal appearance  Adrenals/Urinary Tract: Small cyst at inferior pole RIGHT kidney 2.2 x 1.7 cm image 41. Adrenal glands, kidneys, ureters, and bladder otherwise normal appearance.  Stomach/Bowel: Normal appendix. Sigmoid diverticulosis. Stomach incompletely distended unable to exclude proximal gastric wall thickening. Remaining bowel loops unremarkable.  Vascular/Lymphatic: Atherosclerotic calcifications aorta and coronary arteries. Infrarenal aorta demonstrates mild aneurysmal dilatation to a maximum size of 3.2 x 2.9 cm image 36. Additional atherosclerotic calcifications in the iliac and femoral vessels.  No adenopathy.  Reproductive: Minimal prostatic enlargement with scattered prostatic calcifications. Seminal vesicles unremarkable.  Other: BILATERAL inguinal hernias containing fat, larger on LEFT, with associated mild infiltrative changes question of prior hernia repair. LEFT flank hernia with protrusion of fat through the defect. Minimal posterior displacement of the LEFT kidney without renal or  bowel herniation.  Musculoskeletal: Prior lumbar fusion and strut graft L1-L3. Osseous demineralization. No acute bony findings.  IMPRESSION: Sigmoid diverticulosis without evidence of diverticulitis.  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  BILATERAL inguinal hernias and LEFT flank hernia containing fat.  Unable to exclude proximal gastric wall thickening though this could be an artifact related underdistention ; consider followup endoscopy or upper GI exam to assess.   Electronically Signed   By: Lavonia Dana M.D.   On: 02/19/2017 17:01  CLINICAL DATA:  Left testicular swelling, redness, pain. Gross hematuria x3 days.  EXAM: SCROTAL ULTRASOUND  DOPPLER ULTRASOUND OF THE TESTICLES  TECHNIQUE: Complete ultrasound examination of the testicles, epididymis, and other scrotal structures was performed. Color and spectral Doppler ultrasound were also utilized to evaluate blood flow to the testicles.  COMPARISON:  05/11/2010  FINDINGS: Right testicle  Measurements: 4.8 x 2.6 x 3.4 cm . No mass or microlithiasis visualized.  Left testicle  Measurements: 4.5 x 2.9 x 3.5 cm. No mass or microlithiasis visualized.  Right epididymis: 0.6 x 0.7 cm cyst in the epididymal head. Otherwise Normal in size and appearance.  Left epididymis: 0.6 x 0.5 cm cyst in the epididymal head. Body of the epididymis is heterogeneous and enlarged without hyperemia. No focal mass.  Hydrocele:   Small, bilateral.  Varicocele:  None visualized.  Pulsed Doppler interrogation of both testes demonstrates normal low resistance arterial and venous waveforms bilaterally.  Moderate scrotal skin thickening left greater than right.  IMPRESSION: 1. Negative for testicular mass or torsion. 2. Small bilateral hydroceles. 3. Small epididymal cysts bilaterally. 4. No hyperemia to suggest epididymo-orchitis.   Electronically Signed   By: Lucrezia Europe M.D.   On: 02/19/2017 15:01    Assessment & Plan:    1. Gross hematuria  - I explained to the patient that there are a number of causes that can be associated with blood in the urine, such as stones,  BPH, UTI's, damage to the urinary tract and/or cancer.  -  explained to the patient that the CT scans performed during his hospitalization did not include delayed imaging of his lower urinary tract and therefore some urologic tumors may not be identified - explained to the patient that his options would be to obtain a CT urogram, undergo cystoscopy in the office with the realize a patient that a cancer in the lower ureters may be missed or undergo cystoscopy with bilateral retrogrades in the OR to complete the hematuria workup in addition to the imaging studies  - Patient would like to undergo an in office cystoscopy. I described how this is performed, typically in an office setting with a flexible cystoscope. We described the risks, benefits, and possible side effects, the most common of which is a minor amount of blood in the urine and/or burning which usually resolves in 24 to 48 hours.    - The patient had the opportunity to ask questions which were answered. Based upon this discussion, the patient is willing to proceed. Therefore, I've ordered: a cystoscopy.  - The patient will return following all of the above for discussion of the results.   - UA   2.  Left epididymo orchitis  - resolved  3. Left hydrocele  - Patient requesting more  narcotic pain medication, I explained to the patient that narcotic pain medication is not indicated for hydroceles  - Patient to manage hydrocele conservatively at this time  with supportive undergarments and NSAIDS  - We'll continue to monitor   4.  left retroperitoneal hematoma  -patient had no flank pain and this is likely an incidental finding.  Return for cystoscopy for gross hematuria.  These notes generated with voice recognition software. I apologize for typographical errors.  Zara Council, [facility] [address] [place], [place] [address] [phone]

## 2017-03-07 ENCOUNTER — Encounter: Payer: Self-pay | Admitting: Urology

## 2017-03-07 ENCOUNTER — Ambulatory Visit (INDEPENDENT_AMBULATORY_CARE_PROVIDER_SITE_OTHER): Payer: Medicaid Other | Admitting: Urology

## 2017-03-07 VITALS — BP 115/69 | HR 61 | Temp 97.8°F | Ht 71.0 in | Wt 198.0 lb

## 2017-03-07 DIAGNOSIS — N432 Other hydrocele: Secondary | ICD-10-CM

## 2017-03-07 DIAGNOSIS — R310 Gross hematuria: Secondary | ICD-10-CM

## 2017-03-07 DIAGNOSIS — N451 Epididymitis: Secondary | ICD-10-CM | POA: Diagnosis not present

## 2017-03-07 LAB — URINALYSIS, COMPLETE
Bilirubin, UA: NEGATIVE
Glucose, UA: NEGATIVE
Ketones, UA: NEGATIVE
Leukocytes, UA: NEGATIVE
NITRITE UA: NEGATIVE
PH UA: 5 (ref 5.0–7.5)
Protein, UA: NEGATIVE
Specific Gravity, UA: 1.025 (ref 1.005–1.030)
Urobilinogen, Ur: 0.2 mg/dL (ref 0.2–1.0)

## 2017-03-07 LAB — MICROSCOPIC EXAMINATION
Bacteria, UA: NONE SEEN
WBC, UA: NONE SEEN /hpf (ref 0–?)

## 2017-03-07 NOTE — Patient Instructions (Addendum)
Cystoscopy Cystoscopy is a procedure that is used to help diagnose and sometimes treat conditions that affect that lower urinary tract. The lower urinary tract includes the bladder and the tube that drains urine from the bladder out of the body (urethra). Cystoscopy is performed with a thin, tube-shaped instrument with a light and camera at the end (cystoscope). The cystoscope may be hard (rigid) or flexible, depending on the goal of the procedure.The cystoscope is inserted through the urethra, into the bladder. Cystoscopy may be recommended if you have:  Urinary tractinfections that keep coming back (recurring).  Blood in the urine (hematuria).  Loss of bladder control (urinary incontinence) or an overactive bladder.  Unusual cells found in a urine sample.  A blockage in the urethra.  Painful urination.  An abnormality in the bladder found during an intravenous pyelogram (IVP) or CT scan.  Cystoscopy may also be done to remove a sample of tissue to be examined under a microscope (biopsy). Tell a health care provider about:  Any allergies you have.  All medicines you are taking, including vitamins, herbs, eye drops, creams, and over-the-counter medicines.  Any problems you or family members have had with anesthetic medicines.  Any blood disorders you have.  Any surgeries you have had.  Any medical conditions you have.  Whether you are pregnant or may be pregnant. What are the risks? Generally, this is a safe procedure. However, problems may occur, including:  Infection.  Bleeding.  Allergic reactions to medicines.  Damage to other structures or organs.  What happens before the procedure?  Ask your health care provider about: ? Changing or stopping your regular medicines. This is especially important if you are taking diabetes medicines or blood thinners. ? Taking medicines such as aspirin and ibuprofen. These medicines can thin your blood. Do not take these medicines  before your procedure if your health care provider instructs you not to.  Follow instructions from your health care provider about eating or drinking restrictions.  You may be given antibiotic medicine to help prevent infection.  You may have an exam or testing, such as X-rays of the bladder, urethra, or kidneys.  You may have urine tests to check for signs of infection.  Plan to have someone take you home after the procedure. What happens during the procedure?  To reduce your risk of infection,your health care team will wash or sanitize their hands.  You will be given one or more of the following: ? A medicine to help you relax (sedative). ? A medicine to numb the area (local anesthetic).  The area around the opening of your urethra will be cleaned.  The cystoscope will be passed through your urethra into your bladder.  Germ-free (sterile)fluid will flow through the cystoscope to fill your bladder. The fluid will stretch your bladder so that your surgeon can clearly examine your bladder walls.  The cystoscope will be removed and your bladder will be emptied. The procedure may vary among health care providers and hospitals. What happens after the procedure?  You may have some soreness or pain in your abdomen and urethra. Medicines will be available to help you.  You may have some blood in your urine.  Do not drive for 24 hours if you received a sedative. This information is not intended to replace advice given to you by your health care provider. Make sure you discuss any questions you have with your health care provider. Document Released: 11/05/2000 Document Revised: 03/18/2016 Document Reviewed: 09/25/2015 Elsevier Interactive   2017 [employer]  

## 2017-03-14 ENCOUNTER — Telehealth: Payer: Self-pay

## 2017-03-14 DIAGNOSIS — N5082 Scrotal pain: Secondary | ICD-10-CM

## 2017-03-14 NOTE — Telephone Encounter (Signed)
Please order a scrotal ultrasound for the patient.

## 2017-03-14 NOTE — Telephone Encounter (Signed)
Pt called very upset stating his testicle pain has not gotten any better and he needs help asap. Pt stated that he went to see his PCP who advised him to call [facility] back. Pt stated that there is a hard mass in the testicle and prescribed medication last week has not helped at all. Pt stated that he called the pain clinic who advised him to increase his lyrica and that too has not helped. Pt stated that if pain does not get any better he will need to seek [facility]. Please advise.

## 2017-03-15 NOTE — Telephone Encounter (Signed)
Spoke with pt in reference to scrotal u/s. Made pt aware scheduling will be contacting him. Pt voiced understanding. Orders placed.

## 2017-03-16 ENCOUNTER — Telehealth: Payer: Self-pay

## 2017-03-16 NOTE — Telephone Encounter (Signed)
If patient's pain is that severe, he needs to seek treatment in the ED.

## 2017-03-16 NOTE — Telephone Encounter (Signed)
Spoke to patient and I encouraged patient to go to ED. He wanted to get pain medication and stated he called Pain Management and informed  them of his discomfort with his testicle. They did not give him any other pain medication. I again told pain if the pain is severe the recommendation is for him to go to the ED. Patient voiced understanding.

## 2017-03-16 NOTE — Telephone Encounter (Signed)
Pt called back stating he is in such severe pain that he is having a hard time walking. Pt stated that he called his pain clinic in reference to the pain and needing more medication from them. Per pt Angie at the pain clinic told pt that as long as [facility] is managing and treating for a condition [facility] can provide pain medication.  Angie at pain clinic is [phone] ext [phone].  Please advise.

## 2017-03-17 NOTE — Telephone Encounter (Signed)
Did patient seek treatment in the ED yesterday?

## 2017-03-17 NOTE — Telephone Encounter (Signed)
Spoke with pt in reference to going to the ER. Pt stated that he was not going to go to the ER. Pt stated "the ER does not give out pain medication so im not going to waste my time." Reinforced with pt [facility] is not going to be able to give any pain medication until she has the scrotal u/s results. Pt became very angry stating that his testicle is swollen and very painful and he needs help now. Once again reinforced with pt to see tx at the ER as they would be able to provide immediate care ie: scrotal u/s and IV pain medications if needed. Pt then hung up the phone.

## 2017-03-17 NOTE — Telephone Encounter (Signed)
LMOM

## 2017-03-22 ENCOUNTER — Encounter: Payer: Self-pay | Admitting: Family

## 2017-03-22 ENCOUNTER — Ambulatory Visit
Admission: RE | Admit: 2017-03-22 | Discharge: 2017-03-22 | Disposition: A | Discharge status: Home / Self Care | Payer: Medicaid Other | Source: Ambulatory Visit | Attending: Urology | Admitting: Urology

## 2017-03-22 DIAGNOSIS — N433 Hydrocele, unspecified: Secondary | ICD-10-CM | POA: Insufficient documentation

## 2017-03-22 DIAGNOSIS — N509 Disorder of male genital organs, unspecified: Secondary | ICD-10-CM | POA: Insufficient documentation

## 2017-03-22 DIAGNOSIS — N5082 Scrotal pain: Secondary | ICD-10-CM | POA: Diagnosis present

## 2017-03-23 ENCOUNTER — Encounter: Payer: Self-pay | Admitting: Urology

## 2017-03-23 ENCOUNTER — Ambulatory Visit (INDEPENDENT_AMBULATORY_CARE_PROVIDER_SITE_OTHER): Payer: Medicaid Other | Admitting: Urology

## 2017-03-23 ENCOUNTER — Telehealth: Payer: Self-pay

## 2017-03-23 VITALS — BP 134/73 | HR 62 | Ht 71.0 in | Wt 203.9 lb

## 2017-03-23 DIAGNOSIS — N432 Other hydrocele: Secondary | ICD-10-CM | POA: Diagnosis not present

## 2017-03-23 DIAGNOSIS — R310 Gross hematuria: Secondary | ICD-10-CM

## 2017-03-23 DIAGNOSIS — N453 Epididymo-orchitis: Secondary | ICD-10-CM | POA: Diagnosis not present

## 2017-03-23 LAB — URINALYSIS, COMPLETE
Bilirubin, UA: NEGATIVE
Glucose, UA: NEGATIVE
Ketones, UA: NEGATIVE
NITRITE UA: NEGATIVE
Protein, UA: NEGATIVE
Specific Gravity, UA: 1.015 (ref 1.005–1.030)
Urobilinogen, Ur: 0.2 mg/dL (ref 0.2–1.0)
pH, UA: 5.5 (ref 5.0–7.5)

## 2017-03-23 LAB — MICROSCOPIC EXAMINATION: Bacteria, UA: NONE SEEN

## 2017-03-23 MED ORDER — DOXYCYCLINE HYCLATE 100 MG PO CAPS: 100.0000 mg | ORAL_CAPSULE | Freq: Two times a day (BID) | ORAL | 0 refills | Status: DC

## 2017-03-23 MED ORDER — KETOROLAC TROMETHAMINE 10 MG PO TABS: 10.0000 mg | ORAL_TABLET | Freq: Four times a day (QID) | ORAL | 0 refills | Status: DC | PRN

## 2017-03-23 NOTE — Progress Notes (Signed)
03/23/2017 4:01 PM   Bradley Sawyer July 26, 1956 [id number]  Referring provider: Curly Rim, MD [name] [address], [place] [address]  Chief Complaint  Patient presents with  . Follow-up    Scrotal infection    HPI: 61 yo WM who is still having left testicular pain who underwent a second scrotal ultrasound for more evaluation.  Background history Patient is a 61 year old Caucasian male who presents today as follow-up after hospitalized for left epididymitis and gross hematuria.  Patient was seen as consult by Dr. Junious Sawyer while in the hospital on 02/19/2017.  At that time, the patient has had a 3 day history of red urine, no clots. He is a smoker. Then yesterday the left testicle became painful and gradually swelled over the past 2 days. He had a white count of 18, MAXIMUM TEMPERATURE of 99, UA with too numerous to count white cells and a few red cells and rare bacteria. He underwent a scrotal ultrasound which revealed no mass or torsion of the testicles. Blood flow seemed symmetric with left scrotal wall thickening. A CT scan with contrast was obtained although the delays stop halfway down with no delays through the distal ureters or the bladder. Findings included a left retroperitoneal hernia, bilateral "hernias containing fat" with infiltrative changes on the left (likely postop from October 2017 hernia repair-care everywhere). The prostate appeared normal. Possibly some irregularity of the right posterior bladder wall. The scan went all the way through the scrotum with out evidence of air or abscess in the scrotum. The patient reports he has no dysuria and is voiding with a good stream. He feels like his urine looks clear tonight.  The patient had a PSA of 0.4 in 2016.  At his visit on 03/07/2017, he was experiencing nocturia.  He denied dysuria, gross hematuria and suprapubic pain. He also stated he had not had fevers, chills, nausea or vomiting.   He is was given Levaquin  and Cleocin.    His blood cultures were positive for staphylococcus aureus, enterobacteriaceae and E. Coli. Urine culture was positive for E. Coli.   He states that he was still experiencing pain in the left scrotal.  He says it is ten times worse than being kicked in the groin.  The swelling in down 20 %.   His UA was unremarkable.  His scrotal exam was unremarkable.  He called the office a few days later complaining that the pain was persistent and he had new scrotal swelling.  A scrotal ultrasound was ordered and he called the office again complaining of excruciating pain and we advised him to seek treatment in the ED.  He did not.  He did keep his ultrasound appointment.    Repeated scrotal ultrasound performed on 03/22/2017 noted no evidence of testicular torsion.  New rounded hypoechoic lesion in upper left testicle which shows mild hypervascularity. This is suspicious for orchitis given evidence of associated epididymitis and its new appearance since prior study approximately 1 month ago. Testicular neoplasm is considered less likely, and follow-up by ultrasound is recommended in 2-3 months.  Probable mild left epididymitis.  Moderate complex left hydrocele, and small right hydrocele.  I have independently reviewed the films.    Today, he states he still having scrotal swelling and pain. He has not had fevers, chills, nausea or vomiting. He has not had any urinary symptoms. His UA today is unremarkable.    He states he is not sexual active.  PMH: Past Medical History:  Diagnosis Date  . Anxiety   . Arthritis   . 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 Cocaine dependence    Last use 1998, attends 3 NA meetings weekly.  . Compression fracture    L2  . COPD (chronic obstructive pulmonary disease) (HCC)    bronchitis  . CVA (cerebral infarction)   . Disorder of vocal cord   . Fatty liver   . GERD (gastroesophageal reflux disease)   . Hx of cardiovascular stress  test    ETT/Lexiscan-Myoview (10/15):  Inferior, inferoapical, apical lateral defect c/w mild ischemia, EF 66%; Intermediate Risk  . Hyperlipidemia   . Hypertension   . Right shoulder pain    2/2 partial rotator cuff tear, tendinopathy, mild subacromial/subdeltioid bursitis, A/C joint arthropathy per MRI done in 3/07  . Tobacco abuse     Surgical History: Past Surgical History:  Procedure Laterality Date  . ANTERIOR LAT LUMBAR FUSION N/A 12/18/2013   Procedure: Lumbar Two Anteriorlateral Corpectomy w/ cage, interbody fusion, anterior plating, Lumbar One to Lumbar Three percutaneous pedicle screws;  Surgeon: Charlie Pitter, MD;  Location: [facility];  Service: Neurosurgery;  Laterality: N/A;  . AORTA -INNOMIATE BYPASS N/A 10/15/2014   Procedure: ENDARTERCTOMY OF  INNOMINATE ARTERY ;  Surgeon: Elam Dutch, MD;  Location: [facility];  Service: Vascular;  Laterality: N/A;  . ARCH AORTOGRAM N/A 08/23/2014   Procedure: ARCH AORTOGRAM;  Surgeon: Elam Dutch, MD;  Location: [facility] CATH LAB;  Service: Cardiovascular;  Laterality: N/A;  . arch aortogram, left carotid,left subclavian angiogram  08-23-2014  . CAROTID ANGIOGRAM Left 08/23/2014   Procedure: CAROTID ANGIOGRAM;  Surgeon: Elam Dutch, MD;  Location: [facility] CATH LAB;  Service: Cardiovascular;  Laterality: Left;  . CHOLECYSTECTOMY  12/2016  . ENDARTERECTOMY Right 10/15/2014   Procedure: ENDARTERECTOMY RIGHT SUBCLAVIAN ARTERY;  Surgeon: Elam Dutch, MD;  Location: [facility];  Service: Vascular;  Laterality: Right;  . ENDARTERECTOMY Right 10/15/2014   Procedure: ENDARTERECTOMY RIGHT COMMON CAROTID ARTERY;  Surgeon: Elam Dutch, MD;  Location: [facility];  Service: Vascular;  Laterality: Right;  . 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 LEFT HEART CATHETERIZATION WITH CORONARY ANGIOGRAM N/A 09/12/2014   Procedure: LEFT HEART CATHETERIZATION WITH CORONARY ANGIOGRAM;  Surgeon: Josue Hector, MD;  Location: [facility] CATH LAB;  Service:  Cardiovascular;  Laterality: N/A;  . LUMBAR SPINE SURGERY  12/18/2013   L2     DR POOL  . NECK SURGERY    . PATCH ANGIOPLASTY Right 10/15/2014   Procedure: PATCH ANGIOPLASTY OF RIGHT SUBCLAVIAN ARTERY , RIGHT COMMON CAROTID ARTERY & INNOMINATE ARTERY;  Surgeon: Elam Dutch, MD;  Location: [facility];  Service: Vascular;  Laterality: Right;  . STERNOTOMY N/A 10/15/2014   Procedure: PARTIAL STERNOTOMY & PLATING OF STERNUM;  Surgeon: Elam Dutch, MD;  Location: [facility];  Service: Vascular;  Laterality: N/A;  . TEE WITHOUT CARDIOVERSION N/A 12/25/2013   Procedure: TRANSESOPHAGEAL ECHOCARDIOGRAM (TEE);  Surgeon: Larey Dresser, MD;  Location: [facility];  Service: Cardiovascular;  Laterality: N/A;    Home Medications:  Allergies as of 03/23/2017   No Known Allergies     Medication List       Accurate as of 03/23/17  4:01 PM. Always use your most recent med list.          ADVAIR DISKUS 500-50 MCG/DOSE Aepb Generic drug:  Fluticasone-Salmeterol INHALE 1 PUFF INTO THE LUNGS EVERY 12 (TWELVE) HOURS.  amLODipine 5 MG tablet Commonly known as:  NORVASC Take by mouth.   aspirin EC 81 MG tablet Take 81 mg by mouth daily.   atorvastatin 10 MG tablet Commonly known as:  LIPITOR Take 1 tablet (10 mg total) by mouth daily.   benazepril 40 MG tablet Commonly known as:  LOTENSIN Take by mouth.   docusate sodium 100 MG capsule Commonly known as:  COLACE Take 1 capsule (100 mg total) by mouth 2 (two) times daily.   doxycycline 100 MG capsule Commonly known as:  VIBRAMYCIN Take 1 capsule (100 mg total) by mouth every 12 (twelve) hours.   FLUoxetine 10 MG capsule Commonly known as:  PROZAC Take 1 capsule (10 mg total) by mouth daily.   isosorbide mononitrate 30 MG 24 hr tablet Commonly known as:  IMDUR Take 1 tablet (30 mg total) by mouth daily.   ketorolac 10 MG tablet Commonly known as:  TORADOL Take 1 tablet (10 mg total) by mouth every 6 (six) hours as needed.     lisinopril-hydrochlorothiazide 20-12.5 MG tablet Commonly known as:  PRINZIDE,ZESTORETIC TAKE 1 TABLET BY MOUTH DAILY.   lubiprostone 24 MCG capsule Commonly known as:  AMITIZA Take by mouth.   meloxicam 15 MG tablet Commonly known as:  MOBIC TAKE 1 TABLET BY MOUTH EVERY DAY   metoprolol 100 MG tablet Commonly known as:  LOPRESSOR Take by mouth.   MULTI-VITAMINS Tabs Take by mouth.   NARCAN 4 MG/0.1ML Liqd nasal spray kit Generic drug:  naloxone INHALE ONE SPRAY EACH NOSTRIL FOR OPIOID EMERGENCY   nitroGLYCERIN 0.4 MG SL tablet Commonly known as:  NITROSTAT Place 0.4 mg under the tongue.   omega-3 acid ethyl esters 1 g capsule Commonly known as:  LOVAZA TAKE 2 CAPSULES (2 G TOTAL) BY MOUTH 2 (TWO) TIMES DAILY.   ondansetron 4 MG tablet Commonly known as:  ZOFRAN Take 1 tablet (4 mg total) by mouth every 8 (eight) hours as needed for nausea or vomiting.   oxyCODONE 15 MG immediate release tablet Commonly known as:  ROXICODONE Take by mouth.   oxyCODONE-acetaminophen 10-325 MG tablet Commonly known as:  PERCOCET Take by mouth.   pantoprazole 40 MG tablet Commonly known as:  PROTONIX TAKE 1 TABLET (40 MG TOTAL) BY MOUTH DAILY.   polyethylene glycol packet Commonly known as:  MIRALAX / GLYCOLAX Take 17 g by mouth daily as needed (for constipation).   albuterol 1.25 MG/3ML nebulizer solution Commonly known as:  ACCUNEB Take 3 mLs by nebulization every 6 (six) hours as needed.   PROAIR HFA 108 (90 Base) MCG/ACT inhaler Generic drug:  albuterol INHALE 2 PUFFS INTO THE LUNGS EVERY 6 (SIX) HOURS AS NEEDED FOR WHEEZING.   promethazine 12.5 MG tablet Commonly known as:  PHENERGAN Take 2 tablets (25 mg total) by mouth every 6 (six) hours as needed for nausea or vomiting.   rOPINIRole 1 MG tablet Commonly known as:  REQUIP Take 1 mg by mouth at bedtime.   tiotropium 18 MCG inhalation capsule Commonly known as:  SPIRIVA Place 18 mcg into inhaler and inhale  daily.       Allergies: No Known Allergies  Family History: Family History  Problem Relation Age of Onset  . Heart disease Father   . Heart attack Father   . Diabetes Paternal Grandmother     1 st degree relatives  . Hypertension Paternal Grandmother   . Diabetes Brother   . Heart disease Brother   . Muscular dystrophy Brother   .  Muscular dystrophy Sister   . Prostate cancer Neg Hx   . Kidney cancer Neg Hx   . Bladder Cancer Neg Hx     Social History:  reports that he quit smoking 2 days ago. His smoking use included Cigarettes. He has a 48.00 pack-year smoking history. He has never used smokeless tobacco. He reports that he does not drink alcohol or use drugs.  ROS: UROLOGY Frequent Urination?: No Hard to postpone urination?: No Burning/pain with urination?: No Get up at night to urinate?: No Leakage of urine?: No Urine stream starts and stops?: No Trouble starting stream?: No Do you have to strain to urinate?: No Blood in urine?: No Urinary tract infection?: No Sexually transmitted disease?: No Injury to kidneys or bladder?: No Painful intercourse?: No Weak stream?: No Erection problems?: No Penile pain?: No  Gastrointestinal Nausea?: No Vomiting?: No Indigestion/heartburn?: No Diarrhea?: No Constipation?: No  Constitutional Fever: No Night sweats?: No Weight loss?: No Fatigue?: No  Skin Skin rash/lesions?: No Itching?: No  Eyes Blurred vision?: No Double vision?: No  Ears/Nose/Throat Sore throat?: No Sinus problems?: No  Hematologic/Lymphatic Swollen glands?: No Easy bruising?: No  Cardiovascular Leg swelling?: No Chest pain?: No  Respiratory Cough?: No Shortness of breath?: Yes  Endocrine Excessive thirst?: No  Musculoskeletal Back pain?: Yes Joint pain?: No  Neurological Headaches?: No Dizziness?: No  Psychologic Depression?: Yes Anxiety?: Yes  Physical Exam: BP 134/73   Pulse 62   Ht '5\' 11"'  (1.803 m)   Wt 203  lb 14.4 oz (92.5 kg)   BMI 28.44 kg/m   Constitutional: Well nourished. Alert and oriented, No acute distress. HEENT: [place] AT, moist mucus membranes. Trachea midline, no masses. Cardiovascular: No clubbing, cyanosis, or edema. Respiratory: Normal respiratory effort, no increased work of breathing. GI: Abdomen is soft, non tender, non distended, no abdominal masses. Liver and spleen not palpable.  No hernias appreciated.  Stool sample for occult testing is not indicated.   GU: No CVA tenderness.  No bladder fullness or masses.  Patient with circumcised phallus.   Urethral meatus is patent.  No penile discharge. No penile lesions or rashes. Scrotum without lesions, cysts, rashes and/or edema.  Right testicle is located scrotally.  Left testicle is palpated firmly,  but patient did not express excruciating pain.  There were areas of induration within the left epididymis.  No erythema, drainage or cellulitis was noticed in the scrotal area.   Left hydrocele noted.  Left epididymal cyst is noted.  No mass is appreciated in right testicle. Right epididymis are normal. Rectal: Not performed.   Skin: No rashes, bruises or suspicious lesions. Lymph: No cervical or inguinal adenopathy. Neurologic: Grossly intact, no focal deficits, moving all 4 extremities. Psychiatric: Normal mood and affect.  Laboratory Data: Lab Results  Component Value Date   WBC 15.1 (H) 02/20/2017   HGB 13.0 02/20/2017   HCT 38.0 (L) 02/20/2017   MCV 91.3 02/20/2017   PLT 162 02/20/2017    Lab Results  Component Value Date   CREATININE 1.07 02/20/2017    Lab Results  Component Value Date   HGBA1C 5.8 05/08/2016    Lab Results  Component Value Date   TSH 0.449 05/08/2016       Component Value Date/Time   CHOL 179 02/11/2015 1413   HDL 36 (L) 02/11/2015 1413   CHOLHDL 5.0 02/11/2015 1413   VLDL NOT CALC 02/11/2015 1413   LDLCALC NOT CALC 02/11/2015 1413    Lab Results  Component Value Date  AST 17  02/29/2016   Lab Results  Component Value Date   ALT 23 02/29/2016    Urinalysis Unremarkable.  See EPIC.   Pertinent Imaging: CLINICAL DATA:  Scrotal pain for 1 month.  EXAM: SCROTAL ULTRASOUND  DOPPLER ULTRASOUND OF THE TESTICLES  TECHNIQUE: Complete ultrasound examination of the testicles, epididymis, and other scrotal structures was performed. Color and spectral Doppler ultrasound were also utilized to evaluate blood flow to the testicles.  COMPARISON:  02/19/2017  FINDINGS: Right testicle  Measurements: 5.1 x 2.4 x 3.3 cm. No mass or microlithiasis visualized.  Left testicle  Measurements: 4.5 x 2.3 x 3.4 cm. A new ill-defined rounded area of decreased echogenicity is seen in the upper left testicle measuring approximately 2.2 x 1.6 by 2.0 cm. This was not seen on recent study 1 month ago, and shows increased blood flow relative to the adjacent normal left testicular parenchyma on color Doppler ultrasound.  Right epididymis: 7 mm cyst or spermatocele in the right epididymal head.  Left epididymis: Mild enlargement of left epididymal head and mildly increased blood flow on color Doppler ultrasound.  Hydrocele: Moderate complex left hydrocele appears mildly increased in size. No significant change in small right hydrocele.  Varicocele:  None visualized.  Pulsed Doppler interrogation of both testes demonstrates normal low resistance arterial and venous waveforms bilaterally.  IMPRESSION: No evidence of testicular torsion.  New rounded hypoechoic lesion in upper left testicle which shows mild hypervascularity. This is suspicious for orchitis given evidence of associated epididymitis and its new appearance since prior study approximately 1 month ago. Testicular neoplasm is considered less likely, and follow-up by ultrasound is recommended in 2-3 months.  Probable mild left epididymitis.  Moderate complex left hydrocele, and small  right hydrocele.   Electronically Signed   By: Earle Gell M.D.   On: 03/22/2017 16:08   Assessment & Plan:    1. Gross hematuria  - I explained to the patient that there are a number of causes that can be associated with blood in the urine, such as stones,  BPH, UTI's, damage to the urinary tract and/or cancer.  -  explained to the patient that the CT scans performed during his hospitalization did not include delayed imaging of his lower urinary tract and therefore some urologic tumors may not be identified - explained to the patient that his options would be to obtain a CT urogram, undergo cystoscopy in the office with the realize a patient that a cancer in the lower ureters may be missed or undergo cystoscopy with bilateral retrogrades in the OR to complete the hematuria workup in addition to the imaging studies  - Patient would like to undergo an in office cystoscopy. I described how this is performed, typically in an office setting with a flexible cystoscope. We described the risks, benefits, and possible side effects, the most common of which is a minor amount of blood in the urine and/or burning which usually resolves in 24 to 48 hours.    - The patient had the opportunity to ask questions which were answered. Based upon this discussion, the patient is willing to proceed. Therefore, I've ordered: a cystoscopy.  - The patient will return following all of the above for discussion of the results.   - UA   2.  Left epididymo orchitis  - seen on scrotal ultrasound  - ? Testicular cancer vs pain pill seeking as he asked for pain medication persistently  - obtained tumor markers at this visit  -  script sent for doxycycline and Toradol to the pharmacy  3. Left hydrocele  - Patient requesting more narcotic pain medication, I explained to the patient that narcotic pain medication is not indicated for hydroceles  - Patient to manage hydrocele conservatively at this time with supportive  undergarments and NSAIDS  - We'll continue to monitor   4.  Left retroperitoneal hematoma  -patient had no flank pain and this is likely an incidental finding.  Return for RTC for cystoscopy.  These notes generated with voice recognition software. I apologize for typographical errors.  Zara Council, [facility] [address], [place],  [address] [phone]

## 2017-03-23 NOTE — Telephone Encounter (Signed)
LMOM

## 2017-03-23 NOTE — Telephone Encounter (Signed)
-----   Message from Nori Riis, PA-C sent at 03/22/2017  9:15 PM EDT ----- Patient has an infection in his testicle .  I need to see him today.

## 2017-03-23 NOTE — Telephone Encounter (Signed)
Spoke with pt in reference to scrotal u/s results and needing to be seen today. Pt voiced understanding. Pt was added to [facility] schedule.

## 2017-03-24 ENCOUNTER — Encounter: Payer: Medicaid Other | Admitting: Urology

## 2017-03-24 LAB — BETA HCG QUANT (REF LAB)

## 2017-03-24 LAB — AFP TUMOR MARKER: AFP-Tumor Marker: 2.9 ng/mL (ref 0.0–8.3)

## 2017-03-24 LAB — LACTATE DEHYDROGENASE: LDH: 176 IU/L (ref 121–224)

## 2017-03-24 NOTE — Progress Notes (Signed)
This encounter was created in error - please disregard.

## 2017-03-31 ENCOUNTER — Ambulatory Visit (HOSPITAL_COMMUNITY)
Admission: RE | Admit: 2017-03-31 | Discharge: 2017-03-31 | Disposition: A | Discharge status: Home / Self Care | Payer: Medicaid Other | Source: Ambulatory Visit | Attending: Family | Admitting: Family

## 2017-03-31 ENCOUNTER — Encounter: Payer: Self-pay | Admitting: Family

## 2017-03-31 ENCOUNTER — Ambulatory Visit (INDEPENDENT_AMBULATORY_CARE_PROVIDER_SITE_OTHER): Payer: Medicaid Other | Admitting: Family

## 2017-03-31 VITALS — BP 141/77 | HR 79 | Temp 98.4°F | Resp 18 | Ht 71.0 in | Wt 206.0 lb

## 2017-03-31 DIAGNOSIS — I6523 Occlusion and stenosis of bilateral carotid arteries: Secondary | ICD-10-CM | POA: Diagnosis not present

## 2017-03-31 DIAGNOSIS — Z9889 Other specified postprocedural states: Secondary | ICD-10-CM | POA: Diagnosis not present

## 2017-03-31 DIAGNOSIS — F17200 Nicotine dependence, unspecified, uncomplicated: Secondary | ICD-10-CM | POA: Diagnosis not present

## 2017-03-31 DIAGNOSIS — R0989 Other specified symptoms and signs involving the circulatory and respiratory systems: Secondary | ICD-10-CM

## 2017-03-31 DIAGNOSIS — I771 Stricture of artery: Secondary | ICD-10-CM | POA: Diagnosis not present

## 2017-03-31 LAB — VAS US CAROTID
LCCADDIAS: -23 cm/s
LCCADSYS: -79 cm/s
LEFT ECA DIAS: -21 cm/s
LEFT VERTEBRAL DIAS: -35 cm/s
LICADSYS: -96 cm/s
LICAPDIAS: -36 cm/s
LICAPSYS: -92 cm/s
Left CCA prox dias: 34 cm/s
Left CCA prox sys: 132 cm/s
Left ICA dist dias: -37 cm/s
RCCAPDIAS: 20 cm/s
RIGHT CCA MID DIAS: 18 cm/s
RIGHT ECA DIAS: -24 cm/s
RIGHT VERTEBRAL DIAS: 24 cm/s
Right CCA prox sys: 158 cm/s

## 2017-03-31 NOTE — Progress Notes (Signed)
Chief Complaint: Follow up Extracranial Carotid Artery Stenosis   History of Present Illness  Bradley Sawyer is a 61 y.o. male patient of Dr. Oneida Alar who is s/p partial sternotomy for exposure of innominate artery and common carotid artery and subclavian artery with endarterectomy of innominate artery, subclavian artery, and common carotid artery with patch angioplasty. Plating of sternum on 10/15/14 by Dr. Oneida Alar and  Dr. Servando Snare.  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 Pt does not recall any stroke or TIA symptoms. Pt states he has had no subsequent TIA's or strokes.  Pt states he has known lumbar spine issues that causes left radiculopathy type pain. He is in treatment by a pain management clinic.   He reports a tractor rolling over him in August 2014.   Pt Diabetic: no Pt smoker: current smoker, quit November 2015, resumed September 2016, pt states that he has quit and resumed smoking multiple times.   Pt meds include: Statin : yes ASA: yes Other anticoagulants/antiplatelets: no   Past Medical History:  Diagnosis Date  . Anxiety   . Arthritis   . 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 Cocaine dependence    Last use 1998, attends 3 NA meetings weekly.  . Compression fracture    L2  . COPD (chronic obstructive pulmonary disease) (HCC)    bronchitis  . CVA (cerebral infarction)   . Disorder of vocal cord   . Fatty liver   . GERD (gastroesophageal reflux disease)   . Hx of cardiovascular stress test    ETT/Lexiscan-Myoview (10/15):  Inferior, inferoapical, apical lateral defect c/w mild ischemia, EF 66%; Intermediate Risk  . Hyperlipidemia   . Hypertension   . Right shoulder pain    2/2 partial rotator cuff tear, tendinopathy, mild subacromial/subdeltioid bursitis, A/C joint arthropathy per MRI done in 3/07  . Tobacco abuse     Social History Social History  Substance Use  Topics  . Smoking status: Heavy Tobacco Smoker    Packs/day: 0.50    Years: 48.00    Types: Cigarettes    Last attempt to quit: 03/21/2017  . Smokeless tobacco: Never Used  . Alcohol use No    Family History Family History  Problem Relation Age of Onset  . Heart disease Father   . Heart attack Father   . Diabetes Paternal Grandmother        1 st degree relatives  . Hypertension Paternal Grandmother   . Diabetes Brother   . Heart disease Brother   . Muscular dystrophy Brother   . Muscular dystrophy Sister   . Prostate cancer Neg Hx   . Kidney cancer Neg Hx   . Bladder Cancer Neg Hx     Surgical History Past Surgical History:  Procedure Laterality Date  . ANTERIOR LAT LUMBAR FUSION N/A 12/18/2013   Procedure: Lumbar Two Anteriorlateral Corpectomy w/ cage, interbody fusion, anterior plating, Lumbar One to Lumbar Three percutaneous pedicle screws;  Surgeon: Charlie Pitter, MD;  Location: [place] [facility];  Service: Neurosurgery;  Laterality: N/A;  . AORTA -INNOMIATE BYPASS N/A 10/15/2014   Procedure: ENDARTERCTOMY OF  INNOMINATE ARTERY ;  Surgeon: Elam Dutch, MD;  Location: [place];  Service: Vascular;  Laterality: N/A;  . ARCH AORTOGRAM N/A 08/23/2014   Procedure: ARCH AORTOGRAM;  Surgeon: Elam Dutch, MD;  Location: [facility] CATH LAB;  Service: Cardiovascular;  Laterality: N/A;  . arch aortogram, left carotid,left subclavian angiogram  08-23-2014  . CAROTID ANGIOGRAM Left 08/23/2014   Procedure: CAROTID ANGIOGRAM;  Surgeon: Elam Dutch, MD;  Location: [facility] CATH LAB;  Service: Cardiovascular;  Laterality: Left;  . CHOLECYSTECTOMY  12/2016  . ENDARTERECTOMY Right 10/15/2014   Procedure: ENDARTERECTOMY RIGHT SUBCLAVIAN ARTERY;  Surgeon: Elam Dutch, MD;  Location: [place];  Service: Vascular;  Laterality: Right;  . ENDARTERECTOMY Right 10/15/2014   Procedure: ENDARTERECTOMY RIGHT COMMON CAROTID ARTERY;  Surgeon: Elam Dutch, MD;  Location: [place];  Service: Vascular;   Laterality: Right;  . 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 LEFT HEART CATHETERIZATION WITH CORONARY ANGIOGRAM N/A 09/12/2014   Procedure: LEFT HEART CATHETERIZATION WITH CORONARY ANGIOGRAM;  Surgeon: Josue Hector, MD;  Location: [facility] CATH LAB;  Service: Cardiovascular;  Laterality: N/A;  . LUMBAR SPINE SURGERY  12/18/2013   L2     DR POOL  . NECK SURGERY    . PATCH ANGIOPLASTY Right 10/15/2014   Procedure: PATCH ANGIOPLASTY OF RIGHT SUBCLAVIAN ARTERY , RIGHT COMMON CAROTID ARTERY & INNOMINATE ARTERY;  Surgeon: Elam Dutch, MD;  Location: [place];  Service: Vascular;  Laterality: Right;  . STERNOTOMY N/A 10/15/2014   Procedure: PARTIAL STERNOTOMY & PLATING OF STERNUM;  Surgeon: Elam Dutch, MD;  Location: [place];  Service: Vascular;  Laterality: N/A;  . TEE WITHOUT CARDIOVERSION N/A 12/25/2013   Procedure: TRANSESOPHAGEAL ECHOCARDIOGRAM (TEE);  Surgeon: Larey Dresser, MD;  Location: [place];  Service: Cardiovascular;  Laterality: N/A;    No Known Allergies  Current Outpatient Prescriptions  Medication Sig Dispense Refill  . ADVAIR DISKUS 500-50 MCG/DOSE AEPB INHALE 1 PUFF INTO THE LUNGS EVERY 12 (TWELVE) HOURS. 180 each 3  . albuterol (ACCUNEB) 1.25 MG/3ML nebulizer solution Take 3 mLs by nebulization every 6 (six) hours as needed.    Marland Kitchen amLODipine (NORVASC) 5 MG tablet Take by mouth.    Marland Kitchen aspirin EC 81 MG tablet Take 81 mg by mouth daily.    Marland Kitchen atorvastatin (LIPITOR) 10 MG tablet Take 1 tablet (10 mg total) by mouth daily. 30 tablet 11  . benazepril (LOTENSIN) 40 MG tablet Take by mouth.    . docusate sodium (COLACE) 100 MG capsule Take 1 capsule (100 mg total) by mouth 2 (two) times daily. 10 capsule 0  . doxycycline (VIBRAMYCIN) 100 MG capsule Take 1 capsule (100 mg total) by mouth every 12 (twelve) hours. 28 capsule 0  . FLUoxetine (PROZAC) 10 MG capsule Take 1 capsule (10 mg total) by mouth daily. 90 capsule 3  . isosorbide mononitrate (IMDUR) 30 MG 24 hr tablet  Take 1 tablet (30 mg total) by mouth daily. 30 tablet 0  . ketorolac (TORADOL) 10 MG tablet Take 1 tablet (10 mg total) by mouth every 6 (six) hours as needed. 20 tablet 0  . lisinopril-hydrochlorothiazide (PRINZIDE,ZESTORETIC) 20-12.5 MG per tablet TAKE 1 TABLET BY MOUTH DAILY. 30 tablet 6  . meloxicam (MOBIC) 15 MG tablet TAKE 1 TABLET BY MOUTH EVERY DAY 30 tablet 3  . metoprolol (LOPRESSOR) 100 MG tablet Take by mouth.    . Multiple Vitamin (MULTI-VITAMINS) TABS Take by mouth.    . naloxone (NARCAN) nasal spray 4 mg/0.1 mL INHALE ONE SPRAY EACH NOSTRIL FOR OPIOID EMERGENCY    . nitroGLYCERIN (NITROSTAT) 0.4 MG SL tablet Place 0.4 mg under the tongue.    Marland Kitchen omega-3 acid ethyl esters (LOVAZA) 1 g capsule TAKE 2 CAPSULES (2 G TOTAL) BY MOUTH 2 (TWO) TIMES DAILY.    Marland Kitchen  ondansetron (ZOFRAN) 4 MG tablet Take 1 tablet (4 mg total) by mouth every 8 (eight) hours as needed for nausea or vomiting. 10 tablet 0  . oxyCODONE (ROXICODONE) 15 MG immediate release tablet Take by mouth.    . oxyCODONE-acetaminophen (PERCOCET) 10-325 MG tablet Take by mouth.    . pantoprazole (PROTONIX) 40 MG tablet TAKE 1 TABLET (40 MG TOTAL) BY MOUTH DAILY. 30 tablet 6  . polyethylene glycol (MIRALAX / GLYCOLAX) packet Take 17 g by mouth daily as needed (for constipation).    Marland Kitchen PROAIR HFA 108 (90 BASE) MCG/ACT inhaler INHALE 2 PUFFS INTO THE LUNGS EVERY 6 (SIX) HOURS AS NEEDED FOR WHEEZING. 22.5 Inhaler 1  . promethazine (PHENERGAN) 12.5 MG tablet Take 2 tablets (25 mg total) by mouth every 6 (six) hours as needed for nausea or vomiting. 10 tablet 0  . rOPINIRole (REQUIP) 1 MG tablet Take 1 mg by mouth at bedtime.    Marland Kitchen tiotropium (SPIRIVA) 18 MCG inhalation capsule Place 18 mcg into inhaler and inhale daily.     No current facility-administered medications for this visit.     Review of Systems : See HPI for pertinent positives and negatives.  Physical Examination  Vitals:   03/31/17 1538 03/31/17 1542  BP: (!) 142/82  (!) 141/77  Pulse: 79   Resp: 18   Temp: 98.4 F (36.9 C)   TempSrc: Oral   SpO2: 92%   Weight: 206 lb (93.4 kg)   Height: 5\' 11"  (1.803 m)    Body mass index is 28.73 kg/m.  General: WDWN obese male in NAD GAIT: normal, stiff Eyes: PERRLA Pulmonary: Respirations are slightly labored with mild activity, limited air movement in all fields, no rales or rhonchi, + transient  wheezes. Occasional moist cough.  Cardiac: regular rhythm and rate, no detected murmur.  VASCULAR EXAM Carotid Bruits Right Left   Positive, soft Negative   Aorta is not palpable. Radial pulses: right is 2+, left is 3+ palpable       LE Pulses Right Left   FEMORAL  palpable  palpable    POPLITEAL 3+ palpable  not palpable   POSTERIOR TIBIAL 3+ palpable  2+ palpable    DORSALIS PEDIS  ANTERIOR TIBIAL not palpable  not palpable     Gastrointestinal: soft, nontender, BS WNL, no r/g, no palpable masses.  Musculoskeletal: No muscle atrophy/wasting. M/S 5/5 throughout, extremities without ischemic changes.  Neurologic: A&O X 3; Appropriate Affect, Speech is normal CN 2-12 intact, pain and light touch intact in extremities, Motor exam as listed above.     Assessment: Bradley Sawyer is a 61 y.o. male who is s/p partial sternotomy for exposure of innominate artery and common carotid artery and subclavian artery with endarterectomy of innominate artery, subclavian artery, and common carotid artery with patch angioplasty. Plating of sternum on 10/15/14 by Dr. Oneida Alar and Dr. Servando Snare.  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   Pt does not recall any stroke or TIA symptoms. He has had no subsequent TIA or stroke.  Prominent right popliteal pulse with no  claudication symptoms, will check right popliteal artery duplex on his return; no right LE arterial duplex results on file.   Fortunately he does not have DM, but unfortunately he continues to smoke. The patient was counseled re smoking cessation and given several free resources re smoking cessation.    DATA(03/31/17):  Carotid Duplex : <40% bilateral proximal internal carotid artery  stenoses. Patent right endarterectomy site noted, based on limited visualization. Bilateral vertebral artery flow is antegrade.  Bilateral subclavian artery waveforms are normal. No significant change compared to the last exam on 01-29-16.     Plan: Follow-up in 18 months with Carotid Duplex scan and right popliteal artery duplex.   I discussed in depth with the patient the nature of atherosclerosis, and emphasized the importance of maximal medical management including strict control of blood pressure, blood glucose, and lipid levels, obtaining regular exercise, and cessation of smoking.  The patient is aware that without maximal medical management the underlying atherosclerotic disease process will progress, limiting the benefit of any interventions. The patient was given information about stroke prevention and what symptoms should prompt the patient to seek immediate medical care. Thank you for allowing [place] to participate in this patient's care.  Clemon Chambers, RN, MSN, FNP-C [facility] Office: [phone]  Clinic Physician: Oneida Alar  03/31/17 3:56 PM

## 2017-03-31 NOTE — Patient Instructions (Signed)
Stroke Prevention Some medical conditions and behaviors are associated with an increased chance of having a stroke. You may prevent a stroke by making healthy choices and managing medical conditions. How can I reduce my risk of having a stroke?  Stay physically active. Get at least 30 minutes of activity on most or all days.  Do not smoke. It may also be helpful to avoid exposure to secondhand smoke.  Limit alcohol use. Moderate alcohol use is considered to be:  No more than 2 drinks per day for men.  No more than 1 drink per day for nonpregnant women.  Eat healthy foods. This involves:  Eating 5 or more servings of fruits and vegetables a day.  Making dietary changes that address high blood pressure (hypertension), high cholesterol, diabetes, or obesity.  Manage your cholesterol levels.  Making food choices that are high in fiber and low in saturated fat, trans fat, and cholesterol may control cholesterol levels.  Take any prescribed medicines to control cholesterol as directed by your health care provider.  Manage your diabetes.  Controlling your carbohydrate and sugar intake is recommended to manage diabetes.  Take any prescribed medicines to control diabetes as directed by your health care provider.  Control your hypertension.  Making food choices that are low in salt (sodium), saturated fat, trans fat, and cholesterol is recommended to manage hypertension.  Ask your health care provider if you need treatment to lower your blood pressure. Take any prescribed medicines to control hypertension as directed by your health care provider.  If you are 18-39 years of age, have your blood pressure checked every 3-5 years. If you are 40 years of age or older, have your blood pressure checked every year.  Maintain a healthy weight.  Reducing calorie intake and making food choices that are low in sodium, saturated fat, trans fat, and cholesterol are recommended to manage  weight.  Stop drug abuse.  Avoid taking birth control pills.  Talk to your health care provider about the risks of taking birth control pills if you are over 35 years old, smoke, get migraines, or have ever had a blood clot.  Get evaluated for sleep disorders (sleep apnea).  Talk to your health care provider about getting a sleep evaluation if you snore a lot or have excessive sleepiness.  Take medicines only as directed by your health care provider.  For some people, aspirin or blood thinners (anticoagulants) are helpful in reducing the risk of forming abnormal blood clots that can lead to stroke. If you have the irregular heart rhythm of atrial fibrillation, you should be on a blood thinner unless there is a good reason you cannot take them.  Understand all your medicine instructions.  Make sure that other conditions (such as anemia or atherosclerosis) are addressed. Get help right away if:  You have sudden weakness or numbness of the face, arm, or leg, especially on one side of the body.  Your face or eyelid droops to one side.  You have sudden confusion.  You have trouble speaking (aphasia) or understanding.  You have sudden trouble seeing in one or both eyes.  You have sudden trouble walking.  You have dizziness.  You have a loss of balance or coordination.  You have a sudden, severe headache with no known cause.  You have new chest pain or an irregular heartbeat. Any of these symptoms may represent a serious problem that is an emergency. Do not wait to see if the symptoms will go away.   Get medical help at once. Call your local emergency services (911 in [place] Do not drive yourself to the hospital. This information is not intended to replace advice given to you by your health care provider. Make sure you discuss any questions you have with your health care provider. Document Released: 12/16/2004 Document Revised: 04/15/2016 Document Reviewed: 05/11/2013 Elsevier  Interactive Patient Education  2017 Elsevier Inc.      Preventing Cerebrovascular Disease Arteries are blood vessels that carry blood that contains oxygen from the heart to all parts of the body. Cerebrovascular disease affects arteries that supply the brain. Any condition that blocks or disrupts blood flow to the brain can cause cerebrovascular disease. Brain cells that lose blood supply start to die within minutes (stroke). Stroke is the main danger of cerebrovascular disease. Atherosclerosis and high blood pressure are common causes of cerebrovascular disease. Atherosclerosis is narrowing and hardening of an artery that results when fat, cholesterol, calcium, or other substances (plaque) build up inside an artery. Plaque reduces blood flow through the artery. High blood pressure increases the risk of bleeding inside the brain. Making diet and lifestyle changes to prevent atherosclerosis and high blood pressure lowers your risk of cerebrovascular disease. What nutrition changes can be made?  Eat more fruits, vegetables, and whole grains.  Reduce how much saturated fat you eat. To do this, eat less red meat and fewer full-fat dairy products.  Eat healthy proteins instead of red meat. Healthy proteins include:  Fish. Eat fish that contains heart-healthy omega-3 fatty acids, twice a week. Examples include salmon, albacore tuna, mackerel, and herring.  Chicken.  Nuts.  Low-fat or nonfat yogurt.  Avoid processed meats, like bacon and lunchmeat.  Avoid foods that contain:  A lot of sugar, such as sweets and drinks with added sugar.  A lot of salt (sodium). Avoid adding extra salt to your food, as told by your health care provider.  Trans fats, such as margarine and baked goods. Trans fats may be listed as "partially hydrogenated oils" on food labels.  Check food labels to see how much sodium, sugar, and trans fats are in foods.  Use vegetable oils that contain low amounts of  saturated fat, such as olive oil or canola oil. What lifestyle changes can be made?  Drink alcohol in moderation. This means no more than 1 drink a day for nonpregnant women and 2 drinks a day for men. One drink equals 12 oz of beer, 5 oz of wine, or 1 oz of hard liquor.  If you are overweight, ask your health care provider to recommend a weight-loss plan for you. Losing 5-10 lb (2.2-4.5 kg) can reduce your risk of diabetes, atherosclerosis, and high blood pressure.  Exercise for 30?60 minutes on most days, or as much as told by your health care provider.  Do moderate-intensity exercise, such as brisk walking, bicycling, and water aerobics. Ask your health care provider which activities are safe for you.  Do not use any products that contain nicotine or tobacco, such as cigarettes and e-cigarettes. If you need help quitting, ask your health care provider. Why are these changes important? Making these changes lowers your risk of many diseases that can cause cerebrovascular disease and stroke. Stroke is a leading cause of death and disability. Making these changes also improves your overall health and quality of life. What can I do to lower my risk? The following factors make you more likely to develop cerebrovascular disease:  Being overweight.  Smoking.  Being physically   inactive.  Eating a high-fat diet.  Having certain health conditions, such as:  Diabetes.  High blood pressure.  Heart disease.  Atherosclerosis.  High cholesterol.  Sickle cell disease. Talk with your health care provider about your risk for cerebrovascular disease. Work with your health care provider to control diseases that you have that may contribute to cerebrovascular disease. Your health care provider may prescribe medicines to help prevent major causes of cerebrovascular disease. Where to find more information: Learn more about preventing cerebrovascular disease from:  National Heart, Lung, and  Blood Institute: www.nhlbi.nih.gov/health/health-topics/topics/stroke  Centers for Disease Control and Prevention: cdc.gov/stroke/about.htm Summary  Cerebrovascular disease can lead to a stroke.  Atherosclerosis and high blood pressure are major causes of cerebrovascular disease.  Making diet and lifestyle changes can reduce your risk of cerebrovascular disease.  Work with your health care provider to get your risk factors under control to reduce your risk of cerebrovascular disease. This information is not intended to replace advice given to you by your health care provider. Make sure you discuss any questions you have with your health care provider. Document Released: 11/23/2015 Document Revised: 05/28/2016 Document Reviewed: 11/23/2015 Elsevier Interactive Patient Education  2017 Elsevier Inc.  

## 2017-04-08 NOTE — Addendum Note (Signed)
Addended by: Lianne Cure A on: 04/08/2017 09:58 AM   Modules accepted: Orders

## 2017-04-13 ENCOUNTER — Encounter: Payer: Self-pay | Admitting: Internal Medicine

## 2017-04-21 ENCOUNTER — Ambulatory Visit (INDEPENDENT_AMBULATORY_CARE_PROVIDER_SITE_OTHER): Payer: Medicaid Other | Admitting: Urology

## 2017-04-21 ENCOUNTER — Encounter: Payer: Self-pay | Admitting: Urology

## 2017-04-21 VITALS — BP 135/74 | HR 72 | Ht 71.0 in | Wt 201.8 lb

## 2017-04-21 DIAGNOSIS — N453 Epididymo-orchitis: Secondary | ICD-10-CM

## 2017-04-21 DIAGNOSIS — N509 Disorder of male genital organs, unspecified: Secondary | ICD-10-CM

## 2017-04-21 DIAGNOSIS — R310 Gross hematuria: Secondary | ICD-10-CM | POA: Diagnosis not present

## 2017-04-21 LAB — URINALYSIS, COMPLETE
BILIRUBIN UA: NEGATIVE
Glucose, UA: NEGATIVE
KETONES UA: NEGATIVE
Leukocytes, UA: NEGATIVE
Nitrite, UA: NEGATIVE
PH UA: 5.5 (ref 5.0–7.5)
PROTEIN UA: NEGATIVE
SPEC GRAV UA: 1.025 (ref 1.005–1.030)
UUROB: 0.2 mg/dL (ref 0.2–1.0)

## 2017-04-21 LAB — MICROSCOPIC EXAMINATION
Bacteria, UA: NONE SEEN
WBC UA: NONE SEEN /HPF (ref 0–?)

## 2017-04-21 MED ORDER — CIPROFLOXACIN HCL 500 MG PO TABS
500.0000 mg | ORAL_TABLET | Freq: Once | ORAL | Status: AC
  Administered 2017-04-21: 500 mg via ORAL

## 2017-04-21 MED ORDER — LIDOCAINE HCL 2 % EX GEL
1.0000 "application " | Freq: Once | CUTANEOUS | Status: AC
  Administered 2017-04-21: 1 via URETHRAL

## 2017-04-21 NOTE — Progress Notes (Signed)
   04/21/17  CC:  Chief Complaint  Patient presents with  . Cysto    HPI: The patient is a 61 year old gentleman who presents today for completion of his gross hematuria workup.  1. Gross hematuria The patient had a CT with contrast that was unremarkable for source of gross hematuria. He presents today for cystoscopy.  2. History of left epididymal orchitis Patient recently a scrotal ultrasound which showed likely epidermal orchitis with a new hypoechoic area, and he was treated with doxycycline. However, neoplasm could not be ruled out. Tumor markers were unremarkable. The radiologist recommended repeat scrotal ultrasound 3 months.  Blood pressure 135/74, pulse 72, height 5\' 11"  (1.803 m), weight 201 lb 12.8 oz (91.5 kg). NED. A&Ox3.   No respiratory distress   Abd soft, NT, ND Normal phallus with bilateral descended testicles  Cystoscopy Procedure Note  Patient identification was confirmed, informed consent was obtained, and patient was prepped using Betadine solution.  Lidocaine jelly was administered per urethral meatus.    Preoperative abx where received prior to procedure.     Pre-Procedure: - Inspection reveals a normal caliber ureteral meatus.  Procedure: The flexible cystoscope was introduced without difficulty - No urethral strictures/lesions are present. - Enlarged prostate Visually obstructive - 4 cm in length - Normal bladder neck - Bilateral ureteral orifices identified - Bladder mucosa  reveals no ulcers, tumors, or lesions - No bladder stones - No trabeculation  Retroflexion shows no intravesical lobe   Post-Procedure: - Patient tolerated the procedure well  Assessment/ Plan:  1. Gross hematuria -Negative work up. Repeat urinalysis in one year.  2. History of left epididymal orchitis This has been treated, however his ultrasound did show a new hypoechoic area for which repeat ultrasound was recommended in 3 months. We'll plan for the patient  follow-up in 3 months with repeat scrotal ultrasound.

## 2017-05-26 ENCOUNTER — Ambulatory Visit (INDEPENDENT_AMBULATORY_CARE_PROVIDER_SITE_OTHER): Payer: Medicaid Other | Admitting: Urology

## 2017-05-26 ENCOUNTER — Telehealth: Payer: Self-pay | Admitting: Urology

## 2017-05-26 ENCOUNTER — Encounter: Payer: Self-pay | Admitting: Urology

## 2017-05-26 VITALS — BP 102/60 | HR 67 | Ht 71.0 in | Wt 206.0 lb

## 2017-05-26 DIAGNOSIS — Z87448 Personal history of other diseases of urinary system: Secondary | ICD-10-CM | POA: Diagnosis not present

## 2017-05-26 DIAGNOSIS — N451 Epididymitis: Secondary | ICD-10-CM | POA: Diagnosis not present

## 2017-05-26 DIAGNOSIS — N432 Other hydrocele: Secondary | ICD-10-CM

## 2017-05-26 LAB — URINALYSIS, COMPLETE
BILIRUBIN UA: NEGATIVE
Glucose, UA: NEGATIVE
Ketones, UA: NEGATIVE
LEUKOCYTES UA: NEGATIVE
Nitrite, UA: NEGATIVE
PH UA: 5 (ref 5.0–7.5)
PROTEIN UA: NEGATIVE
Specific Gravity, UA: 1.025 (ref 1.005–1.030)
Urobilinogen, Ur: 0.2 mg/dL (ref 0.2–1.0)

## 2017-05-26 LAB — MICROSCOPIC EXAMINATION
RBC, UA: NONE SEEN /hpf (ref 0–?)
RENAL EPITHEL UA: NONE SEEN /HPF

## 2017-05-26 MED ORDER — SULFAMETHOXAZOLE-TRIMETHOPRIM 800-160 MG PO TABS: 1.0000 | ORAL_TABLET | Freq: Two times a day (BID) | ORAL | 0 refills | Status: DC

## 2017-05-26 NOTE — Progress Notes (Signed)
05/26/2017 3:42 PM   Bradley Sawyer 06/29/1956 [id number]  Referring provider: Curly Rim, MD [facility] [address], [place] [address]  Chief Complaint  Patient presents with  . Groin Swelling    HPI: 62 yo WM who presents today requesting an urgent appointment for testicular swelling.    Background history Patient is a 61 year old Caucasian male who presents today as follow-up after hospitalized for left epididymitis and gross hematuria.  Patient was seen as consult by Dr. Junious Silk while in the hospital on 02/19/2017.  At that time, the patient has had a 3 day history of red urine, no clots. He is a smoker. Then yesterday the left testicle became painful and gradually swelled over the past 2 days. He had a white count of 18, MAXIMUM TEMPERATURE of 99, UA with too numerous to count white cells and a few red cells and rare bacteria. He underwent a scrotal ultrasound which revealed no mass or torsion of the testicles. Blood flow seemed symmetric with left scrotal wall thickening. A CT scan with contrast was obtained although the delays stop halfway down with no delays through the distal ureters or the bladder. Findings included a left retroperitoneal hernia, bilateral "hernias containing fat" with infiltrative changes on the left (likely postop from October 2017 hernia repair-care everywhere). The prostate appeared normal. Possibly some irregularity of the right posterior bladder wall. The scan went all the way through the scrotum with out evidence of air or abscess in the scrotum. The patient reports he has no dysuria and is voiding with a good stream. He feels like his urine looks clear tonight.  The patient had a PSA of 0.4 in 2016.  At his visit on 03/07/2017, he was experiencing nocturia.  He denied dysuria, gross hematuria and suprapubic pain. He also stated he had not had fevers, chills, nausea or vomiting.   He is was given Levaquin and Cleocin.    His blood cultures were  positive for staphylococcus aureus, enterobacteriaceae and E. Coli. Urine culture was positive for E. Coli.   He states that he was still experiencing pain in the left scrotal.  He says it is ten times worse than being kicked in the groin.  The swelling in down 20 %.   His UA was unremarkable.  His scrotal exam was unremarkable.  He called the office a few days later complaining that the pain was persistent and he had new scrotal swelling.  A scrotal ultrasound was ordered and he called the office again complaining of excruciating pain and we advised him to seek treatment in the ED.  He did not.  He did keep his ultrasound appointment.    Repeated scrotal ultrasound performed on 03/22/2017 noted no evidence of testicular torsion.  New rounded hypoechoic lesion in upper left testicle which shows mild hypervascularity. This is suspicious for orchitis given evidence of associated epididymitis and its new appearance since prior study approximately 1 month ago. Testicular neoplasm is considered less likely, and follow-up by ultrasound is recommended in 2-3 months.  Probable mild left epididymitis.  Moderate complex left hydrocele, and small right hydrocele.    He then presented on 04/21/2017 for cystoscopy to complete his hematuria workup.  No malignancies were identified.  Today, he states he woke up yesterday and noticed his left testicle was swollen and painful.  6-7/10 pain.  Pain medication helps the pain.  Nothing makes the pain worse.  He has not had fevers, chills, nausea or vomiting.  He has not had any penile discharge, dysuria or gross hematuria.  He has not had any urinary symptoms.  He states he is not sexual active.   His UA was unremarkable.     PMH: Past Medical History:  Diagnosis Date  . Anxiety   . Arthritis   . 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 Cocaine dependence    Last use 1998, attends 3 NA meetings weekly.  . Compression fracture    L2  . COPD  (chronic obstructive pulmonary disease) (HCC)    bronchitis  . CVA (cerebral infarction)   . Disorder of vocal cord   . Fatty liver   . GERD (gastroesophageal reflux disease)   . Hx of cardiovascular stress test    ETT/Lexiscan-Myoview (10/15):  Inferior, inferoapical, apical lateral defect c/w mild ischemia, EF 66%; Intermediate Risk  . Hyperlipidemia   . Hypertension   . Right shoulder pain    2/2 partial rotator cuff tear, tendinopathy, mild subacromial/subdeltioid bursitis, A/C joint arthropathy per MRI done in 3/07  . Tobacco abuse     Surgical History: Past Surgical History:  Procedure Laterality Date  . ANTERIOR LAT LUMBAR FUSION N/A 12/18/2013   Procedure: Lumbar Two Anteriorlateral Corpectomy w/ cage, interbody fusion, anterior plating, Lumbar One to Lumbar Three percutaneous pedicle screws;  Surgeon: Charlie Pitter, MD;  Location: [facility];  Service: Neurosurgery;  Laterality: N/A;  . AORTA -INNOMIATE BYPASS N/A 10/15/2014   Procedure: ENDARTERCTOMY OF  INNOMINATE ARTERY ;  Surgeon: Elam Dutch, MD;  Location: [place] [facility];  Service: Vascular;  Laterality: N/A;  . ARCH AORTOGRAM N/A 08/23/2014   Procedure: ARCH AORTOGRAM;  Surgeon: Elam Dutch, MD;  Location: [facility] CATH LAB;  Service: Cardiovascular;  Laterality: N/A;  . arch aortogram, left carotid,left subclavian angiogram  08-23-2014  . CAROTID ANGIOGRAM Left 08/23/2014   Procedure: CAROTID ANGIOGRAM;  Surgeon: Elam Dutch, MD;  Location: [facility] CATH LAB;  Service: Cardiovascular;  Laterality: Left;  . CHOLECYSTECTOMY  12/2016  . ENDARTERECTOMY Right 10/15/2014   Procedure: ENDARTERECTOMY RIGHT SUBCLAVIAN ARTERY;  Surgeon: Elam Dutch, MD;  Location: [facility];  Service: Vascular;  Laterality: Right;  . ENDARTERECTOMY Right 10/15/2014   Procedure: ENDARTERECTOMY RIGHT COMMON CAROTID ARTERY;  Surgeon: Elam Dutch, MD;  Location: [place];  Service: Vascular;  Laterality: Right;  . FETAL SURGERY FOR CONGENITAL HERNIA   08/2016  . LEFT HEART CATHETERIZATION WITH CORONARY ANGIOGRAM N/A 09/12/2014   Procedure: LEFT HEART CATHETERIZATION WITH CORONARY ANGIOGRAM;  Surgeon: Josue Hector, MD;  Location: [facility] CATH LAB;  Service: Cardiovascular;  Laterality: N/A;  . LUMBAR SPINE SURGERY  12/18/2013   L2     DR POOL  . NECK SURGERY    . PATCH ANGIOPLASTY Right 10/15/2014   Procedure: PATCH ANGIOPLASTY OF RIGHT SUBCLAVIAN ARTERY , RIGHT COMMON CAROTID ARTERY & INNOMINATE ARTERY;  Surgeon: Elam Dutch, MD;  Location: [place];  Service: Vascular;  Laterality: Right;  . STERNOTOMY N/A 10/15/2014   Procedure: PARTIAL STERNOTOMY & PLATING OF STERNUM;  Surgeon: Elam Dutch, MD;  Location: [place];  Service: Vascular;  Laterality: N/A;  . TEE WITHOUT CARDIOVERSION N/A 12/25/2013   Procedure: TRANSESOPHAGEAL ECHOCARDIOGRAM (TEE);  Surgeon: Larey Dresser, MD;  Location: [place];  Service: Cardiovascular;  Laterality: N/A;    Home Medications:  Allergies as of 05/26/2017   No Known Allergies     Medication List       Accurate as of 05/26/17  3:42 PM. Always use your most recent med list.          ADVAIR DISKUS 500-50 MCG/DOSE Aepb Generic drug:  Fluticasone-Salmeterol INHALE 1 PUFF INTO THE LUNGS EVERY 12 (TWELVE) HOURS.   amLODipine 5 MG tablet Commonly known as:  NORVASC Take by mouth.   aspirin EC 81 MG tablet Take 81 mg by mouth daily.   atorvastatin 10 MG tablet Commonly known as:  LIPITOR Take 1 tablet (10 mg total) by mouth daily.   benazepril 40 MG tablet Commonly known as:  LOTENSIN Take by mouth.   docusate sodium 100 MG capsule Commonly known as:  COLACE Take 1 capsule (100 mg total) by mouth 2 (two) times daily.   FLUoxetine 10 MG capsule Commonly known as:  PROZAC Take 1 capsule (10 mg total) by mouth daily.   ketorolac 10 MG tablet Commonly known as:  TORADOL Take 1 tablet (10 mg total) by mouth every 6 (six) hours as needed.   lisinopril-hydrochlorothiazide 20-12.5 MG  tablet Commonly known as:  PRINZIDE,ZESTORETIC TAKE 1 TABLET BY MOUTH DAILY.   meloxicam 15 MG tablet Commonly known as:  MOBIC TAKE 1 TABLET BY MOUTH EVERY DAY   metoprolol tartrate 100 MG tablet Commonly known as:  LOPRESSOR Take by mouth.   MULTI-VITAMINS Tabs Take by mouth.   NARCAN 4 MG/0.1ML Liqd nasal spray kit Generic drug:  naloxone INHALE ONE SPRAY EACH NOSTRIL FOR OPIOID EMERGENCY   ondansetron 4 MG tablet Commonly known as:  ZOFRAN Take 1 tablet (4 mg total) by mouth every 8 (eight) hours as needed for nausea or vomiting.   oxyCODONE 15 MG immediate release tablet Commonly known as:  ROXICODONE Take by mouth.   oxyCODONE-acetaminophen 10-325 MG tablet Commonly known as:  PERCOCET Take by mouth.   pantoprazole 40 MG tablet Commonly known as:  PROTONIX TAKE 1 TABLET (40 MG TOTAL) BY MOUTH DAILY.   polyethylene glycol packet Commonly known as:  MIRALAX / GLYCOLAX Take 17 g by mouth daily as needed (for constipation).   albuterol 1.25 MG/3ML nebulizer solution Commonly known as:  ACCUNEB Take 3 mLs by nebulization every 6 (six) hours as needed.   PROAIR HFA 108 (90 Base) MCG/ACT inhaler Generic drug:  albuterol INHALE 2 PUFFS INTO THE LUNGS EVERY 6 (SIX) HOURS AS NEEDED FOR WHEEZING.   promethazine 12.5 MG tablet Commonly known as:  PHENERGAN Take 2 tablets (25 mg total) by mouth every 6 (six) hours as needed for nausea or vomiting.   rOPINIRole 1 MG tablet Commonly known as:  REQUIP Take 1 mg by mouth at bedtime.   sulfamethoxazole-trimethoprim 800-160 MG tablet Commonly known as:  BACTRIM DS,SEPTRA DS Take 1 tablet by mouth every 12 (twelve) hours.   tiotropium 18 MCG inhalation capsule Commonly known as:  SPIRIVA Place 18 mcg into inhaler and inhale daily.       Allergies: No Known Allergies  Family History: Family History  Problem Relation Age of Onset  . Heart disease Father   . Heart attack Father   . Diabetes Paternal Grandmother         1 st degree relatives  . Hypertension Paternal Grandmother   . Diabetes Brother   . Heart disease Brother   . Muscular dystrophy Brother   . Muscular dystrophy Sister   . Prostate cancer Neg Hx   . Kidney cancer Neg Hx   . Bladder Cancer Neg Hx     Social History:  reports that he has been smoking Cigarettes.  He has a 24.00 pack-year smoking history. He has never used smokeless tobacco. He reports that he does not drink alcohol or use drugs.  ROS: UROLOGY Frequent Urination?: No Hard to postpone urination?: No Burning/pain with urination?: No Get up at night to urinate?: Yes Leakage of urine?: No Urine stream starts and stops?: No Trouble starting stream?: No Do you have to strain to urinate?: No Blood in urine?: No Urinary tract infection?: No Sexually transmitted disease?: No Injury to kidneys or bladder?: No Painful intercourse?: No Weak stream?: No Erection problems?: No Penile pain?: No  Gastrointestinal Nausea?: No Vomiting?: No Indigestion/heartburn?: No Diarrhea?: No Constipation?: Yes  Constitutional Fever: No Night sweats?: No Weight loss?: No Fatigue?: No  Skin Skin rash/lesions?: No Itching?: No  Eyes Blurred vision?: No Double vision?: No  Ears/Nose/Throat Sore throat?: No Sinus problems?: No  Hematologic/Lymphatic Swollen glands?: No Easy bruising?: No  Cardiovascular Leg swelling?: No Chest pain?: No  Respiratory Cough?: No Shortness of breath?: Yes  Endocrine Excessive thirst?: No  Musculoskeletal Back pain?: Yes Joint pain?: No  Neurological Headaches?: No Dizziness?: No  Psychologic Depression?: Yes Anxiety?: Yes  Physical Exam: BP 102/60   Pulse 67   Ht '5\' 11"'  (1.803 m)   Wt 206 lb (93.4 kg)   BMI 28.73 kg/m   Constitutional: Well nourished. Alert and oriented, No acute distress. HEENT: [place] AT, moist mucus membranes. Trachea midline, no masses. Cardiovascular: No clubbing, cyanosis, or  edema. Respiratory: Normal respiratory effort, no increased work of breathing. GI: Abdomen is soft, non tender, non distended, no abdominal masses. Liver and spleen not palpable.  No hernias appreciated.  Stool sample for occult testing is not indicated.   GU: No CVA tenderness.  No bladder fullness or masses.  Patient with circumcised phallus.   Urethral meatus is patent.  No penile discharge. No penile lesions or rashes. Scrotum without lesions, cysts, rashes and/or edema.  Right testicle is located scrotally.  Left hydrocele noted.  Left epididymal cyst is noted.  He states it is painful to palpation.  No mass is appreciated in right testicle. Right epididymis are normal. Rectal: Not performed.   Skin: No rashes, bruises or suspicious lesions. Lymph: No cervical or inguinal adenopathy. Neurologic: Grossly intact, no focal deficits, moving all 4 extremities. Psychiatric: Normal mood and affect.  Laboratory Data: Lab Results  Component Value Date   WBC 15.1 (H) 02/20/2017   HGB 13.0 02/20/2017   HCT 38.0 (L) 02/20/2017   MCV 91.3 02/20/2017   PLT 162 02/20/2017    Lab Results  Component Value Date   CREATININE 1.07 02/20/2017    Lab Results  Component Value Date   HGBA1C 5.8 05/08/2016    Lab Results  Component Value Date   TSH 0.449 05/08/2016       Component Value Date/Time   CHOL 179 02/11/2015 1413   HDL 36 (L) 02/11/2015 1413   CHOLHDL 5.0 02/11/2015 1413   VLDL NOT CALC 02/11/2015 1413   LDLCALC NOT CALC 02/11/2015 1413    Lab Results  Component Value Date   AST 17 02/29/2016   Lab Results  Component Value Date   ALT 23 02/29/2016    Urinalysis Unremarkable.  See EPIC.   I have reviewed the labs.  Pertinent Imaging: CLINICAL DATA:  Scrotal pain for 1 month.  EXAM: SCROTAL ULTRASOUND  DOPPLER ULTRASOUND OF THE TESTICLES  TECHNIQUE: Complete ultrasound examination of the testicles, epididymis, and other scrotal structures was performed.  Color and spectral Doppler ultrasound  were also utilized to evaluate blood flow to the testicles.  COMPARISON:  02/19/2017  FINDINGS: Right testicle  Measurements: 5.1 x 2.4 x 3.3 cm. No mass or microlithiasis visualized.  Left testicle  Measurements: 4.5 x 2.3 x 3.4 cm. A new ill-defined rounded area of decreased echogenicity is seen in the upper left testicle measuring approximately 2.2 x 1.6 by 2.0 cm. This was not seen on recent study 1 month ago, and shows increased blood flow relative to the adjacent normal left testicular parenchyma on color Doppler ultrasound.  Right epididymis: 7 mm cyst or spermatocele in the right epididymal head.  Left epididymis: Mild enlargement of left epididymal head and mildly increased blood flow on color Doppler ultrasound.  Hydrocele: Moderate complex left hydrocele appears mildly increased in size. No significant change in small right hydrocele.  Varicocele:  None visualized.  Pulsed Doppler interrogation of both testes demonstrates normal low resistance arterial and venous waveforms bilaterally.  IMPRESSION: No evidence of testicular torsion.  New rounded hypoechoic lesion in upper left testicle which shows mild hypervascularity. This is suspicious for orchitis given evidence of associated epididymitis and its new appearance since prior study approximately 1 month ago. Testicular neoplasm is considered less likely, and follow-up by ultrasound is recommended in 2-3 months.  Probable mild left epididymitis.  Moderate complex left hydrocele, and small right hydrocele.   Electronically Signed   By: Earle Gell M.D.   On: 03/22/2017 16:08   Assessment & Plan:    1. History of hematuria  - hematuria work up did not identify malignancies  - UA in one year 03/2018    2.  Left epididymo orchitis  - script sent for Septra DS  - UA not suspicious for infection - will send for culture to be complete  - RTC in 2  weeks for recheck    3. Left hydrocele  - Patient requesting more narcotic pain medication, I explained to the patient that narcotic pain medication is not indicated for hydroceles  - Patient to manage hydrocele conservatively at this time with supportive undergarments and NSAIDS  - We'll continue to monitor    Return in about 2 weeks (around 06/09/2017) for recheck.  These notes generated with voice recognition software. I apologize for typographical errors.  Zara Council, [facility] [address] [place], [place] [address] [phone]

## 2017-05-26 NOTE — Telephone Encounter (Signed)
Would you watch for patient's urine culture results?  He is on Septra.

## 2017-05-28 LAB — CULTURE, URINE COMPREHENSIVE

## 2017-05-30 NOTE — Telephone Encounter (Signed)
LMOM for patient to call office. Patient urine culture result was negative. No growth. Patient needs to stop Septra antibiotic, per Dr. Louis Meckel

## 2017-05-30 NOTE — Telephone Encounter (Addendum)
Spoke to patient. Gave results. Went over last OV plan and assessment. Patient was upset that nothing could be done about his swollen testicle. Went over assessment and plan again,  encouraged to monitor and gave reason for f/u in 2 weeks w/ [place]rene [place]. Pt verbalized understanding and then h/u.

## 2017-06-09 ENCOUNTER — Ambulatory Visit: Payer: Medicaid Other | Admitting: Urology

## 2017-07-14 ENCOUNTER — Ambulatory Visit: Payer: Medicaid Other

## 2017-07-18 ENCOUNTER — Telehealth: Payer: Self-pay | Admitting: Urology

## 2017-07-18 NOTE — Telephone Encounter (Signed)
Patient did not have his Ultrasound done and he cx his follow up with you on 07-21-17 said he did not want to reschd when I called to ask why he no showed for his [place].   Sharyn Lull

## 2017-07-21 ENCOUNTER — Ambulatory Visit: Payer: Medicaid Other

## 2018-04-03 ENCOUNTER — Other Ambulatory Visit (HOSPITAL_COMMUNITY): Payer: Self-pay | Admitting: Neurosurgery

## 2018-04-03 DIAGNOSIS — M5416 Radiculopathy, lumbar region: Secondary | ICD-10-CM

## 2018-04-12 ENCOUNTER — Ambulatory Visit
Admission: RE | Admit: 2018-04-12 | Discharge: 2018-04-12 | Disposition: A | Discharge status: Home / Self Care | Payer: Medicaid Other | Source: Ambulatory Visit | Attending: Neurosurgery | Admitting: Neurosurgery

## 2018-04-12 DIAGNOSIS — M5416 Radiculopathy, lumbar region: Secondary | ICD-10-CM | POA: Diagnosis not present

## 2018-04-12 MED ORDER — GADOBENATE DIMEGLUMINE 529 MG/ML IV SOLN
20.0000 mL | Freq: Once | INTRAVENOUS | Status: AC | PRN
  Administered 2018-04-12: 20 mL via INTRAVENOUS

## 2018-05-24 DIAGNOSIS — F17210 Nicotine dependence, cigarettes, uncomplicated: Secondary | ICD-10-CM | POA: Insufficient documentation

## 2018-10-05 ENCOUNTER — Ambulatory Visit: Payer: Medicaid Other | Admitting: Family

## 2018-10-05 ENCOUNTER — Encounter (HOSPITAL_COMMUNITY): Payer: Medicaid Other

## 2018-10-06 ENCOUNTER — Encounter: Payer: Self-pay | Admitting: Family

## 2018-12-07 ENCOUNTER — Other Ambulatory Visit: Payer: Self-pay | Admitting: Neurosurgery

## 2018-12-07 ENCOUNTER — Ambulatory Visit
Admission: RE | Admit: 2018-12-07 | Discharge: 2018-12-07 | Disposition: A | Discharge status: Home / Self Care | Payer: Medicaid Other | Source: Ambulatory Visit | Attending: Neurosurgery | Admitting: Neurosurgery

## 2018-12-07 ENCOUNTER — Ambulatory Visit
Admission: RE | Admit: 2018-12-07 | Discharge: 2018-12-07 | Disposition: A | Discharge status: Home / Self Care | Payer: Medicaid Other | Attending: Neurosurgery | Admitting: Neurosurgery

## 2018-12-07 DIAGNOSIS — M79602 Pain in left arm: Secondary | ICD-10-CM | POA: Diagnosis present

## 2019-03-29 ENCOUNTER — Other Ambulatory Visit: Payer: Self-pay | Admitting: Neurosurgery

## 2019-07-16 ENCOUNTER — Telehealth: Payer: Self-pay | Admitting: Internal Medicine

## 2019-07-16 NOTE — Telephone Encounter (Signed)
Called patient for COVID-19 pre-screening for in office visit.  Have you recently traveled any where out of the local area in the last 2 weeks? No  Have you been in close contact with a person diagnosed with COVID-19 or someone awaiting results within the last 2 weeks? No  Do you currently have any of the following symptoms? If so, when did they start? Cough (yes- years)    Diarrhea              Joint Pain Fever      Muscle Pain   Red eyes Shortness of breath (Yes- years)  Abdominal pain                       Vomiting Loss of smell    Rash    Sore Throat Headache    Weakness   Bruising or bleeding  Pt states to have Bronchitis.  Okay to proceed with visit. (date)  / Needs to reschedule visit. (date)

## 2019-07-16 NOTE — Telephone Encounter (Signed)
I would recommend virtual or phone.

## 2019-07-16 NOTE — Telephone Encounter (Signed)
Spoke with pt about recommended visit due to symptoms and bronchitis. He stated that he was diagnosed with bronchitis years ago but thought that was something that you have forever that's why he mentioned it. The cough and SOB he said is due to his COPD. Pt is ok with virtual or phone visit. Nothing further needed.

## 2019-07-17 NOTE — Progress Notes (Addendum)
[facility] Consultation    Virtual Visit via Video Note I connected with patient on 07/18/19 at 11:30 AM EDT by video and verified that I am speaking with the correct person using two identifiers. This visit was later converted to telephone due to technical problems.    I discussed the limitations, risks of performing an evaluation and management service by video and the availability of in person appointments. I also discussed with the patient that there may be a patient responsible charge related to this service.  In light of current covid-19 pandemic, patient also understands that we are trying to protect them by minimizing in office contact if at all possible.  The patient expressed understanding and agreed to proceed. Please see note below for further detail.    The patient was advised to call back or seek an in-person evaluation if the symptoms worsen or if the condition fails to improve as anticipated. I spent 25 minutes of non face-to-face time during this visit.   Bradley Hobby, MD   Assessment and Plan:  Severe COPD/emphysema. Dyspnea on exertion. - Patient's most recent imaging suggestive of COPD. - Patient appears to have significant dyspnea on minimal exertion, he appears to be on near maximal therapy with Advair/Spiriva. - I have asked him to start using his nebulizer twice daily in addition to his other inhaled medications. - Recommend that he try to increase his physical activity level. - We will check an overnight oximetry, 6-minute walk, pulmonary function test, baseline chest x-ray. - At next visit we will assess vaccine status, and consider referral for lung cancer screening. -Patient appears to be at high risk of exacerbation, further decline.  History of nicotine abuse. - He has recently quit smoking about 8 months ago and is encouraged to continue cessation.  Orders Placed This Encounter  Procedures  . DG Chest 2 View  . Pulmonary  Function Test [facility] Only  . Pulse oximetry, overnight  . 6 minute walk   Return in about 3 months (around 10/18/2019).   Date: 07/18/2019  MRN# [id number] Bradley Sawyer 03-08-1956  Referring Physician: Bradly Bienenstock NP for COPD.   Bradley Sawyer is a 63 y.o. old male seen in consultation for chief complaint of: COPD.     HPI:  Bradley Sawyer is a 63 y.o. old male he has a history of essential hypertension and opioid-induced constipation. He was diagnosed with COPD about 30 years ago, he is on 3 different inhalers.  He quit smoking 7-8 months ago.  He feels that his breathing is short with walking around the house. He lives with his son and grand kids. He was mowing grass on a riding mower but he finds that his breathing will get worse a day later.  He has a nebulizer once per week. He is using advair twice daily, rinses mouth after, spiriva once daily, proair twice per day.  Breathing is worse with humidity and heat. He gets winded with taking a shower and getting dressed. He gets winded with walking around a grocery store but does ok.  He is also limited by back pain.  His son smokes.  He has reflux, controlled with meds.  Denies sinus drainage.  He has a nebulizer, he uses albuterol once or twice per month.  He is not on oxygen.    **CT Ap 02/19/2017>> some images of the lower lungs are seen, these show emphysematous changes. **Chest x-ray 05/08/2016>> imaging personally reviewed, hyperinflation consistent with COPD. **CT  chest 12/19/2014>> changes of chronic bronchitis, otherwise unremarkable.  Addendum, review of outside records. 38 minutes spent in review of >50 pages of reports. **Echo 03/30/2019>>RVSP 29; moderately dilated LA, EF 60%.  **CT chest report 03/29/2019>> negative lung cancer screening CT. **nuclear Stress test 06/05/18>>EF 75%, ischemia in RCA territory.  **Echo 05/17/16>> EF 50%, RVSP 13.8.  PMHX:   Past Medical History:  Diagnosis Date  . Anxiety   .  Arthritis   . 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 Cocaine dependence    Last use 1998, attends 3 NA meetings weekly.  . Compression fracture    L2  . COPD (chronic obstructive pulmonary disease) (HCC)    bronchitis  . CVA (cerebral infarction)   . Disorder of vocal cord   . Fatty liver   . GERD (gastroesophageal reflux disease)   . Hx of cardiovascular stress test    ETT/Lexiscan-Myoview (10/15):  Inferior, inferoapical, apical lateral defect c/w mild ischemia, EF 66%; Intermediate Risk  . Hyperlipidemia   . Hypertension   . Right shoulder pain    2/2 partial rotator cuff tear, tendinopathy, mild subacromial/subdeltioid bursitis, A/C joint arthropathy per MRI done in 3/07  . Tobacco abuse    Surgical Hx:  Past Surgical History:  Procedure Laterality Date  . ANTERIOR LAT LUMBAR FUSION N/A 12/18/2013   Procedure: Lumbar Two Anteriorlateral Corpectomy w/ cage, interbody fusion, anterior plating, Lumbar One to Lumbar Three percutaneous pedicle screws;  Surgeon: Charlie Pitter, MD;  Location: [place] [facility]URO ORS;  Service: Neurosurgery;  Laterality: N/A;  . AORTA -INNOMIATE BYPASS N/A 10/15/2014   Procedure: ENDARTERCTOMY OF  INNOMINATE ARTERY ;  Surgeon: Elam Dutch, MD;  Location: [place];  Service: Vascular;  Laterality: N/A;  . ARCH AORTOGRAM N/A 08/23/2014   Procedure: ARCH AORTOGRAM;  Surgeon: Elam Dutch, MD;  Location: [facility] CATH LAB;  Service: Cardiovascular;  Laterality: N/A;  . arch aortogram, left carotid,left subclavian angiogram  08-23-2014  . CAROTID ANGIOGRAM Left 08/23/2014   Procedure: CAROTID ANGIOGRAM;  Surgeon: Elam Dutch, MD;  Location: [facility] CATH LAB;  Service: Cardiovascular;  Laterality: Left;  . CHOLECYSTECTOMY  12/2016  . ENDARTERECTOMY Right 10/15/2014   Procedure: ENDARTERECTOMY RIGHT SUBCLAVIAN ARTERY;  Surgeon: Elam Dutch, MD;  Location: [name];  Service: Vascular;  Laterality: Right;  . ENDARTERECTOMY Right 10/15/2014    Procedure: ENDARTERECTOMY RIGHT COMMON CAROTID ARTERY;  Surgeon: Elam Dutch, MD;  Location: [place];  Service: Vascular;  Laterality: Right;  . 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 LEFT HEART CATHETERIZATION WITH CORONARY ANGIOGRAM N/A 09/12/2014   Procedure: LEFT HEART CATHETERIZATION WITH CORONARY ANGIOGRAM;  Surgeon: Josue Hector, MD;  Location: [facility] CATH LAB;  Service: Cardiovascular;  Laterality: N/A;  . LUMBAR SPINE SURGERY  12/18/2013   L2     DR POOL  . NECK SURGERY    . PATCH ANGIOPLASTY Right 10/15/2014   Procedure: PATCH ANGIOPLASTY OF RIGHT SUBCLAVIAN ARTERY , RIGHT COMMON CAROTID ARTERY & INNOMINATE ARTERY;  Surgeon: Elam Dutch, MD;  Location: [place];  Service: Vascular;  Laterality: Right;  . STERNOTOMY N/A 10/15/2014   Procedure: PARTIAL STERNOTOMY & PLATING OF STERNUM;  Surgeon: Elam Dutch, MD;  Location: [place];  Service: Vascular;  Laterality: N/A;  . TEE WITHOUT CARDIOVERSION N/A 12/25/2013   Procedure: TRANSESOPHAGEAL ECHOCARDIOGRAM (TEE);  Surgeon: Larey Dresser, MD;  Location: [facility] ENDOSCOPY;  Service: Cardiovascular;  Laterality: N/A;   Family Hx:  Family History  Problem Relation Age of Onset  . Heart disease Father   . Heart attack Father   . Diabetes Paternal Grandmother        1 st degree relatives  . Hypertension Paternal Grandmother   . Diabetes Brother   . Heart disease Brother   . Muscular dystrophy Brother   . Muscular dystrophy Sister   . Prostate cancer Neg Hx   . Kidney cancer Neg Hx   . Bladder Cancer Neg Hx    Social Hx:   Social History   Tobacco Use  . Smoking status: Heavy Tobacco Smoker    Packs/day: 0.50    Years: 48.00    Pack years: 24.00    Types: Cigarettes    Last attempt to quit: 03/21/2017    Years since quitting: 2.3  . Smokeless tobacco: Never Used  Substance Use Topics  . Alcohol use: No    Alcohol/week: 0.0 standard drinks  . Drug use: No    Types: Cocaine, Marijuana    Comment: no recent use    Medication:    Current Outpatient Medications:  .  ADVAIR DISKUS 500-50 MCG/DOSE AEPB, INHALE 1 PUFF INTO THE LUNGS EVERY 12 (TWELVE) HOURS., Disp: 180 each, Rfl: 3 .  albuterol (ACCUNEB) 1.25 MG/3ML nebulizer solution, Take 3 mLs by nebulization every 6 (six) hours as needed., Disp: , Rfl:  .  amLODipine (NORVASC) 5 MG tablet, Take by mouth., Disp: , Rfl:  .  aspirin EC 81 MG tablet, Take 81 mg by mouth daily., Disp: , Rfl:  .  atorvastatin (LIPITOR) 10 MG tablet, Take 1 tablet (10 mg total) by mouth daily., Disp: 30 tablet, Rfl: 11 .  benazepril (LOTENSIN) 40 MG tablet, Take by mouth., Disp: , Rfl:  .  docusate sodium (COLACE) 100 MG capsule, Take 1 capsule (100 mg total) by mouth 2 (two) times daily., Disp: 10 capsule, Rfl: 0 .  FLUoxetine (PROZAC) 10 MG capsule, Take 1 capsule (10 mg total) by mouth daily., Disp: 90 capsule, Rfl: 3 .  ketorolac (TORADOL) 10 MG tablet, Take 1 tablet (10 mg total) by mouth every 6 (six) hours as needed., Disp: 20 tablet, Rfl: 0 .  lisinopril-hydrochlorothiazide (PRINZIDE,ZESTORETIC) 20-12.5 MG per tablet, TAKE 1 TABLET BY MOUTH DAILY., Disp: 30 tablet, Rfl: 6 .  meloxicam (MOBIC) 15 MG tablet, TAKE 1 TABLET BY MOUTH EVERY DAY, Disp: 30 tablet, Rfl: 3 .  metoprolol (LOPRESSOR) 100 MG tablet, Take by mouth., Disp: , Rfl:  .  Multiple Vitamin (MULTI-VITAMINS) TABS, Take by mouth., Disp: , Rfl:  .  naloxone (NARCAN) nasal spray 4 mg/0.1 mL, INHALE ONE SPRAY EACH NOSTRIL FOR OPIOID EMERGENCY, Disp: , Rfl:  .  ondansetron (ZOFRAN) 4 MG tablet, Take 1 tablet (4 mg total) by mouth every 8 (eight) hours as needed for nausea or vomiting., Disp: 10 tablet, Rfl: 0 .  oxyCODONE (ROXICODONE) 15 MG immediate release tablet, Take by mouth., Disp: , Rfl:  .  oxyCODONE-acetaminophen (PERCOCET) 10-325 MG tablet, Take by mouth., Disp: , Rfl:  .  pantoprazole (PROTONIX) 40 MG tablet, TAKE 1 TABLET (40 MG TOTAL) BY MOUTH DAILY., Disp: 30 tablet, Rfl: 6 .  polyethylene glycol  (MIRALAX / GLYCOLAX) packet, Take 17 g by mouth daily as needed (for constipation)., Disp: , Rfl:  .  PROAIR HFA 108 (90 BASE) MCG/ACT inhaler, INHALE 2 PUFFS INTO THE LUNGS EVERY 6 (SIX) HOURS AS NEEDED FOR WHEEZING., Disp: 22.5 Inhaler, Rfl: 1 .  promethazine (PHENERGAN) 12.5 MG tablet, Take 2 tablets (  25 mg total) by mouth every 6 (six) hours as needed for nausea or vomiting., Disp: 10 tablet, Rfl: 0 .  rOPINIRole (REQUIP) 1 MG tablet, Take 1 mg by mouth at bedtime., Disp: , Rfl:  .  sulfamethoxazole-trimethoprim (BACTRIM DS,SEPTRA DS) 800-160 MG tablet, Take 1 tablet by mouth every 12 (twelve) hours., Disp: 28 tablet, Rfl: 0 .  tiotropium (SPIRIVA) 18 MCG inhalation capsule, Place 18 mcg into inhaler and inhale daily., Disp: , Rfl:    Allergies:  Patient has no known allergies.  Review of Systems: Gen:  Denies  fever, sweats, chills HEENT: Denies blurred vision, double vision. bleeds, sore throat Cvc:  No dizziness, chest pain. Resp:   Denies cough or sputum production, shortness of breath Gi: Denies swallowing difficulty, stomach pain. Gu:  Denies bladder incontinence, burning urine Ext:   No Joint pain, stiffness. Skin: No skin rash,  hives  Endoc:  No polyuria, polydipsia. Psych: No depression, insomnia. Other:  All other systems were reviewed with the patient and were negative other that what is mentioned in the HPI.    LABORATORY PANEL:   CBC No results for input(s): WBC, HGB, HCT, PLT in the last 168 hours. ------------------------------------------------------------------------------------------------------------------  Chemistries  No results for input(s): NA, K, CL, CO2, GLUCOSE, BUN, CREATININE, CALCIUM, MG, AST, ALT, ALKPHOS, BILITOT in the last 168 hours.  Invalid input(s): GFRCGP ------------------------------------------------------------------------------------------------------------------  Cardiac Enzymes No results for input(s): TROPONINI in the last 168  hours. ------------------------------------------------------------  RADIOLOGY:  No results found.     Thank  you for the consultation and for allowing [facility] to assist in the care of your patient. Our recommendations are noted above.  Please contact us if we can be of further service.   Marda Stalker, M.D., F.C.C.P.  Board Certified in Internal Medicine, Pulmonary Medicine, [place], and Sleep Medicine.  [facility] Office Number: [phone]   07/18/2019

## 2019-07-18 ENCOUNTER — Ambulatory Visit (INDEPENDENT_AMBULATORY_CARE_PROVIDER_SITE_OTHER): Payer: Medicaid Other | Admitting: Internal Medicine

## 2019-07-18 DIAGNOSIS — J449 Chronic obstructive pulmonary disease, unspecified: Secondary | ICD-10-CM | POA: Diagnosis not present

## 2019-07-18 NOTE — Patient Instructions (Addendum)
Will check chest x ray, lung function test.  Will need to get a copy of echocardiogram and office notes from your cardiologist's office.  Will check overnight oxymetry.  Use your nebulizer twice per day.

## 2019-07-23 ENCOUNTER — Ambulatory Visit
Admission: RE | Admit: 2019-07-23 | Discharge: 2019-07-23 | Disposition: A | Discharge status: Home / Self Care | Payer: Medicaid Other | Source: Ambulatory Visit | Attending: Internal Medicine | Admitting: Internal Medicine

## 2019-07-23 ENCOUNTER — Other Ambulatory Visit: Payer: Self-pay

## 2019-07-23 ENCOUNTER — Telehealth: Payer: Self-pay | Admitting: Internal Medicine

## 2019-07-23 DIAGNOSIS — J449 Chronic obstructive pulmonary disease, unspecified: Secondary | ICD-10-CM | POA: Diagnosis present

## 2019-07-23 NOTE — Telephone Encounter (Signed)
Spoke with pt, he was asking about his follow up appointments.  I reviewed all of the tests ordered by PR at his last OV.  As we were discussing this, the phone call was dropped.  ATC pt back, but his line rang to a fast busy signal.  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

## 2019-07-25 ENCOUNTER — Encounter: Payer: Self-pay | Admitting: Internal Medicine

## 2019-07-27 NOTE — Telephone Encounter (Signed)
Pt is aware that a recall has been placed for 10/16/2019. Advised pt to contact our office once he has received letter to schedule an appointment.   Pt voiced his understanding.  Nothing further is needed.

## 2019-08-02 ENCOUNTER — Telehealth: Payer: Self-pay | Admitting: Internal Medicine

## 2019-08-02 DIAGNOSIS — J449 Chronic obstructive pulmonary disease, unspecified: Secondary | ICD-10-CM

## 2019-08-02 NOTE — Telephone Encounter (Signed)
ONO reviewed by Dr. Iran Planas 2L QHS.  ATC pt to make aware of results- no answer with no option to leave vm.  Will call back.

## 2019-08-03 NOTE — Telephone Encounter (Signed)
Pt is aware of results and voiced his understanding.  Order has been placed for 2L QHS. Nothing further is needed.

## 2019-08-09 ENCOUNTER — Telehealth: Payer: Self-pay | Admitting: Internal Medicine

## 2019-08-09 NOTE — Telephone Encounter (Signed)
Pt received 02 from [employer]. Pt is aware of [facility] and advised that PFT has been scheduled for 10/09/2019 at 8:15 at [facility]. Pt received appointment but can not locate so another appointment sheet with the PFT and instructions attached mailed to patient. Rhonda J Cobb Nothing else needed at this time. Rhonda J Cobb

## 2019-08-13 ENCOUNTER — Ambulatory Visit: Payer: Medicaid Other

## 2019-08-17 ENCOUNTER — Other Ambulatory Visit: Payer: Self-pay

## 2019-08-17 ENCOUNTER — Ambulatory Visit: Payer: Medicaid Other

## 2019-08-17 DIAGNOSIS — J439 Emphysema, unspecified: Secondary | ICD-10-CM

## 2019-08-17 NOTE — Progress Notes (Signed)
SIX MIN WALK 08/17/2019  Medications Oxycodone 15mg  at 7am and 1pm. Advair, spiriva, and Albuterol neb. He reports his meds come pre-packaged so he is unsure what other meds he took today.  Supplimental Oxygen during Test? (L/min) No  Laps 11  Partial Lap (in Meters) 0  Baseline BP (sitting) 122/70  Baseline Heartrate 58  Baseline Dyspnea (Borg Scale) 4  Baseline Fatigue (Borg Scale) 0  Baseline SPO2 90  BP (sitting) 162/84  Heartrate 67  Dyspnea (Borg Scale) 10  Fatigue (Borg Scale) 10  SPO2 93  BP (sitting) 130/78  Heartrate 60  SPO2 93  Distance Completed 374  Tech Comments: Tolerated well. Did not need to stop. Reports he was minimally SOB at end of last lap.

## 2019-08-17 NOTE — Progress Notes (Signed)
Patient came in to 6MW. Initial 02 was 90%. Ambulated 5 laps around building, never desaturated below 90%. 52mw done. Tolerated well.

## 2019-09-02 NOTE — Progress Notes (Signed)
  09/03/2019 2:55 PM   Bradley Sawyer 03/14/1956 [id number]  Referring provider: Corrington, Kip A, MD [address] [place],  [place] [address]  Chief Complaint  Patient presents with  . Urinary Frequency    HPI: Bradley Sawyer is a 63 year old male with a history of hematuria, history of orchitis and hydrocele who presents today for incomplete bladder empyting.    History of hematuria Former smoker - quit less than one year ago.  Contrast CT in 01/2017 revealed a small cyst at inferior pole RIGHT kidney 2.2 x 1.7 cm image 41. Adrenal glands, kidneys, ureters, and bladder otherwise normal appearance.  Minimal prostatic enlargement with scattered prostatic calcifications. Seminal vesicles unremarkable.  Cystoscopy in 03/2017 with Dr. Budzyn noted an enlarged prostate Visually obstructive - 4 cm in length.  He does not report any gross hematuria.  UA with 3-10 RBC's.    History of orchitis/hydrocele Scrotal ultrasound in 03/2017 revealed no evidence of testicular torsion.  New rounded hypoechoic lesion in upper left testicle which shows mild hypervascularity. This is suspicious for orchitis given evidence of associated epididymitis and its new appearance since prior study approximately 1 month ago. Testicular neoplasm is considered less likely, and follow-up by ultrasound is recommended in 2-3 months.  Probable mild left epididymitis.  Moderate complex left hydrocele, and small right hydrocele.  Tumor markers were negative.  He did not follow up with repeat imaging as he said he wasn't informed that he needed a follow up ultrasound.  He did seek a second opinion regarding our recommendation in 05/2017 with urology in [place] and they also recommended a follow up scrotal ultrasound which he did not follow up with the repeat imaging.    BPH WITH LUTS  (prostate and/or bladder) IPSS score: 15/3    PVR: 30 mL   Major complaint(s):  Nocturia, intermittency and a weak urinary stream that  started 3 to 5 days ago.  He states after he void, he still have to urinate but nothing comes out.  Denies any dysuria, hematuria or suprapubic pain.   Denies any recent fevers, chills, nausea or vomiting.  He does not have any family history of prostate cancer.   IPSS    Row Name 09/03/19 1400         International Prostate Symptom Score   How often have you had the sensation of not emptying your bladder?  About half the time     How often have you had to urinate less than every two hours?  Less than half the time     How often have you found you stopped and started again several times when you urinated?  Less than half the time     How often have you found it difficult to postpone urination?  Not at All     How often have you had a weak urinary stream?  About half the time     How often have you had to strain to start urination?  About half the time     How many times did you typically get up at night to urinate?  2 Times     Total IPSS Score  15       Quality of Life due to urinary symptoms   If you were to spend the rest of your life with your urinary condition just the way it is now how would you feel about that?  Mixed          Score:  1-7 Mild 8-19 Moderate 20-35 Severe     PMH: Past Medical History:  Diagnosis Date  . Anxiety   . Arthritis   . 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 Cocaine dependence    Last use 1998, attends 3 NA meetings weekly.  . Compression fracture    L2  . COPD (chronic obstructive pulmonary disease) (HCC)    bronchitis  . CVA (cerebral infarction)   . Disorder of vocal cord   . Fatty liver   . GERD (gastroesophageal reflux disease)   . Hx of cardiovascular stress test    ETT/Lexiscan-Myoview (10/15):  Inferior, inferoapical, apical lateral defect c/w mild ischemia, EF 66%; Intermediate Risk  . Hyperlipidemia   . Hypertension   . Right shoulder pain    2/2 partial rotator cuff tear, tendinopathy, mild  subacromial/subdeltioid bursitis, A/C joint arthropathy per MRI done in 3/07  . Tobacco abuse     Surgical History: Past Surgical History:  Procedure Laterality Date  . ANTERIOR LAT LUMBAR FUSION N/A 12/18/2013   Procedure: Lumbar Two Anteriorlateral Corpectomy w/ cage, interbody fusion, anterior plating, Lumbar One to Lumbar Three percutaneous pedicle screws;  Surgeon: Henry A Pool, MD;  Location: [facility];  Service: Neurosurgery;  Laterality: N/A;  . AORTA -INNOMIATE BYPASS N/A 10/15/2014   Procedure: ENDARTERCTOMY OF  INNOMINATE ARTERY ;  Surgeon: Charles E Fields, MD;  Location: [facility] OR;  Service: Vascular;  Laterality: N/A;  . ARCH AORTOGRAM N/A 08/23/2014   Procedure: ARCH AORTOGRAM;  Surgeon: Charles E Fields, MD;  Location: [facility];  Service: Cardiovascular;  Laterality: N/A;  . arch aortogram, left carotid,left subclavian angiogram  08-23-2014  . CAROTID ANGIOGRAM Left 08/23/2014   Procedure: CAROTID ANGIOGRAM;  Surgeon: Charles E Fields, MD;  Location: [facility];  Service: Cardiovascular;  Laterality: Left;  . CHOLECYSTECTOMY  12/2016  . ENDARTERECTOMY Right 10/15/2014   Procedure: ENDARTERECTOMY RIGHT SUBCLAVIAN ARTERY;  Surgeon: Charles E Fields, MD;  Location: [facility];  Service: Vascular;  Laterality: Right;  . ENDARTERECTOMY Right 10/15/2014   Procedure: ENDARTERECTOMY RIGHT COMMON CAROTID ARTERY;  Surgeon: Charles E Fields, MD;  Location: [facility];  Service: Vascular;  Laterality: Right;  . 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 LEFT HEART CATHETERIZATION WITH CORONARY ANGIOGRAM N/A 09/12/2014   Procedure: LEFT HEART CATHETERIZATION WITH CORONARY ANGIOGRAM;  Surgeon: Peter C Nishan, MD;  Location: [facility];  Service: Cardiovascular;  Laterality: N/A;  . LUMBAR SPINE SURGERY  12/18/2013   L2     DR POOL  . NECK SURGERY    . PATCH ANGIOPLASTY Right 10/15/2014   Procedure: PATCH ANGIOPLASTY OF RIGHT SUBCLAVIAN ARTERY , RIGHT COMMON CAROTID ARTERY & INNOMINATE  ARTERY;  Surgeon: Charles E Fields, MD;  Location: [facility];  Service: Vascular;  Laterality: Right;  . STERNOTOMY N/A 10/15/2014   Procedure: PARTIAL STERNOTOMY & PLATING OF STERNUM;  Surgeon: Charles E Fields, MD;  Location: [facility];  Service: Vascular;  Laterality: N/A;  . TEE WITHOUT CARDIOVERSION N/A 12/25/2013   Procedure: TRANSESOPHAGEAL ECHOCARDIOGRAM (TEE);  Surgeon: Dalton S McLean, MD;  Location: [facility];  Service: Cardiovascular;  Laterality: N/A;    Home Medications:  Allergies as of 09/03/2019   No Known Allergies     Medication List       Accurate as of September 03, 2019  2:55 PM. If you have any questions, ask your nurse or doctor.        STOP taking these medications     sulfamethoxazole-trimethoprim 800-160 MG tablet Commonly known as: BACTRIM DS Stopped by: Annette Liotta, PA-C     TAKE these medications   Advair Diskus 500-50 MCG/DOSE Aepb Generic drug: Fluticasone-Salmeterol INHALE 1 PUFF INTO THE LUNGS EVERY 12 (TWELVE) HOURS.   amLODipine 5 MG tablet Commonly known as: NORVASC Take by mouth.   aspirin EC 81 MG tablet Take 81 mg by mouth daily.   atorvastatin 10 MG tablet Commonly known as: Lipitor Take 1 tablet (10 mg total) by mouth daily.   benazepril 40 MG tablet Commonly known as: LOTENSIN Take by mouth.   docusate sodium 100 MG capsule Commonly known as: COLACE Take 1 capsule (100 mg total) by mouth 2 (two) times daily.   FLUoxetine 10 MG capsule Commonly known as: PROZAC Take 1 capsule (10 mg total) by mouth daily.   ketorolac 10 MG tablet Commonly known as: TORADOL Take 1 tablet (10 mg total) by mouth every 6 (six) hours as needed.   lisinopril-hydrochlorothiazide 20-12.5 MG tablet Commonly known as: ZESTORETIC TAKE 1 TABLET BY MOUTH DAILY.   meloxicam 15 MG tablet Commonly known as: MOBIC TAKE 1 TABLET BY MOUTH EVERY DAY   metoprolol tartrate 100 MG tablet Commonly known as: LOPRESSOR Take by mouth.   Multi-Vitamins Tabs  Take by mouth.   Narcan 4 MG/0.1ML Liqd nasal spray kit Generic drug: naloxone INHALE ONE SPRAY EACH NOSTRIL FOR OPIOID EMERGENCY   ondansetron 4 MG tablet Commonly known as: Zofran Take 1 tablet (4 mg total) by mouth every 8 (eight) hours as needed for nausea or vomiting.   oxyCODONE 15 MG immediate release tablet Commonly known as: ROXICODONE Take by mouth.   oxyCODONE-acetaminophen 10-325 MG tablet Commonly known as: PERCOCET Take by mouth.   pantoprazole 40 MG tablet Commonly known as: PROTONIX TAKE 1 TABLET (40 MG TOTAL) BY MOUTH DAILY.   polyethylene glycol 17 g packet Commonly known as: MIRALAX / GLYCOLAX Take 17 g by mouth daily as needed (for constipation).   albuterol 1.25 MG/3ML nebulizer solution Commonly known as: ACCUNEB Take 3 mLs by nebulization every 6 (six) hours as needed.   ProAir HFA 108 (90 Base) MCG/ACT inhaler Generic drug: albuterol INHALE 2 PUFFS INTO THE LUNGS EVERY 6 (SIX) HOURS AS NEEDED FOR WHEEZING.   promethazine 12.5 MG tablet Commonly known as: PHENERGAN Take 2 tablets (25 mg total) by mouth every 6 (six) hours as needed for nausea or vomiting.   rOPINIRole 1 MG tablet Commonly known as: REQUIP Take 1 mg by mouth at bedtime.   tiotropium 18 MCG inhalation capsule Commonly known as: SPIRIVA Place 18 mcg into inhaler and inhale daily.       Allergies: No Known Allergies  Family History: Family History  Problem Relation Age of Onset  . Heart disease Father   . Heart attack Father   . Diabetes Paternal Grandmother        1 st degree relatives  . Hypertension Paternal Grandmother   . Diabetes Brother   . Heart disease Brother   . Muscular dystrophy Brother   . Muscular dystrophy Sister   . Prostate cancer Neg Hx   . Kidney cancer Neg Hx   . Bladder Cancer Neg Hx     Social History:  reports that he has been smoking cigarettes. He has a 24.00 pack-year smoking history. He has never used smokeless tobacco. He reports that  he does not drink alcohol or use drugs.  ROS: UROLOGY Frequent Urination?: No Hard to postpone urination?: No Burning/pain with  urination?: No Get up at night to urinate?: Yes Leakage of urine?: No Urine stream starts and stops?: Yes Trouble starting stream?: No Do you have to strain to urinate?: No Blood in urine?: No Urinary tract infection?: No Sexually transmitted disease?: No Injury to kidneys or bladder?: No Painful intercourse?: No Weak stream?: Yes Erection problems?: No Penile pain?: No  Gastrointestinal Nausea?: Yes Vomiting?: No Indigestion/heartburn?: No Diarrhea?: No Constipation?: Yes  Constitutional Fever: No Night sweats?: No Weight loss?: No Fatigue?: No  Skin Skin rash/lesions?: No Itching?: No  Eyes Blurred vision?: No Double vision?: No  Ears/Nose/Throat Sore throat?: No Sinus problems?: No  Hematologic/Lymphatic Swollen glands?: No Easy bruising?: No  Cardiovascular Leg swelling?: No Chest pain?: No  Respiratory Cough?: No Shortness of breath?: Yes  Endocrine Excessive thirst?: No  Musculoskeletal Back pain?: Yes Joint pain?: No  Neurological Headaches?: Yes Dizziness?: No  Psychologic Depression?: Yes Anxiety?: Yes  Physical Exam: BP (!) 107/58   Pulse 62   Ht 5' 11" (1.803 m)   Wt 225 lb (102.1 kg)   BMI 31.38 kg/m   Constitutional:  Well nourished. Alert and oriented, No acute distress. HEENT: [place] AT, moist mucus membranes.  Trachea midline, no masses. Cardiovascular: No clubbing, cyanosis, or edema. Respiratory: Normal respiratory effort, no increased work of breathing. GI: Abdomen is soft, non tender, non distended, no abdominal masses. Liver and spleen not palpable.  No hernias appreciated.  Stool sample for occult testing is not indicated.   GU: No CVA tenderness.  No bladder fullness or masses.  Patient with circumcised phallus.   Urethral meatus is patent.  No penile discharge. No penile lesions or  rashes. Scrotum without lesions, cysts, rashes and/or edema.  Testicles are located scrotally bilaterally. No masses are appreciated in the testicles. Left and right epididymis are normal. Rectal: Patient with  normal sphincter tone. Anus and perineum without scarring or rashes. No rectal masses are appreciated. Prostate is approximately 50grams, no nodules are appreciated. Seminal vesicles could not be palpated.   Skin: No rashes, bruises or suspicious lesions. Lymph: No inguinal adenopathy. Neurologic: Grossly intact, no focal deficits, moving all 4 extremities. Psychiatric: Normal mood and affect.  Laboratory Data: PSA Trend  0.4 on 10/21/2015 Lab Results  Component Value Date   WBC 15.1 (H) 02/20/2017   HGB 13.0 02/20/2017   HCT 38.0 (L) 02/20/2017   MCV 91.3 02/20/2017   PLT 162 02/20/2017    Lab Results  Component Value Date   CREATININE 1.07 02/20/2017    No results found for: PSA  No results found for: TESTOSTERONE  Lab Results  Component Value Date   HGBA1C 5.8 05/08/2016    Lab Results  Component Value Date   TSH 0.449 05/08/2016       Component Value Date/Time   CHOL 179 02/11/2015 1413   HDL 36 (L) 02/11/2015 1413   CHOLHDL 5.0 02/11/2015 1413   VLDL NOT CALC 02/11/2015 1413   LDLCALC NOT CALC 02/11/2015 1413    Lab Results  Component Value Date   AST 17 02/29/2016   Lab Results  Component Value Date   ALT 23 02/29/2016   No components found for: ALKALINEPHOPHATASE No components found for: BILIRUBINTOTAL  No results found for: ESTRADIOL  Urinalysis Component     Latest Ref Rng & Units 09/03/2019  Specific Gravity, UA     1.005 - 1.030 1.025  pH, UA     5.0 - 7.5 5.0  Color, UA     Yellow   Yellow  Appearance Ur     Clear Clear  Leukocytes,UA     Negative Trace (A)  Protein,UA     Negative/Trace Negative  Glucose, UA     Negative Negative  Ketones, UA     Negative Negative  RBC, UA     Negative 1+ (A)  Bilirubin, UA      Negative Negative  Urobilinogen, Ur     0.2 - 1.0 mg/dL 0.2  Nitrite, UA     Negative Negative  Microscopic Examination      See below:   Component     Latest Ref Rng & Units 09/03/2019  WBC, UA     0 - 5 /hpf 0-5  RBC     0 - 2 /hpf 3-10 (A)  Epithelial Cells (non renal)     0 - 10 /hpf 0-10  Bacteria, UA     None seen/Few None seen    I have reviewed the labs.   Pertinent Imaging: Results for KINGSLEY, FARACE (MRN [id number]) as of 09/03/2019 14:38  Ref. Range 09/03/2019 14:19  Scan Result Unknown 29m    Assessment & Plan:    1. History of hematuria Hematuria work up completed in 03/2017- findings positive for enlarged prostate Visually obstructive - 4 cm in length.  No report of gross hematuria  UA today 3-10 RBC's  Will schedule hematuria work up with CTU and cystoscopy  I explained to the patient that there are a number of causes that can be associated with blood in the urine, such as stones, BPH, UTI's, damage to the urinary tract and/or cancer. I explained to the patient that a contrast material will be injected into a vein and that in rare instances, an allergic reaction can result and may even life threatening (1:100,000)  The patient denies any allergies to contrast, iodine and/or seafood and is not taking metformin. Following the imaging study,  I've recommended a cystoscopy. I described how this is performed, typically in an office setting with a flexible cystoscope. We described the risks, benefits, and possible side effects, the most common of which is a minor amount of blood in the urine and/or burning which usually resolves in 24 to 48 hours.   The patient had the opportunity to ask questions which were answered. Based upon this discussion, the patient is willing to proceed. Therefore, I've ordered: a CT Urogram and cystoscopy. The patient will return following all of the above for discussion of the results.   2. History of orchitis/hydrocele Schedule  scrotal ultrasound  3. BPH with LUTS IPSS score is 15/3,  Continue conservative management, avoiding bladder irritants and timed voiding's Most bothersome symptoms is/are post void urgency Cystoscopy pending for micro heme - will reassess once hematuria work up is completed   Return for CT Urogram report and cystoscopy.  These notes generated with voice recognition software. I apologize for typographical errors.  SZara Council PA-C  [facility] [address] [place] [phone]

## 2019-09-03 ENCOUNTER — Encounter: Payer: Self-pay | Admitting: Urology

## 2019-09-03 ENCOUNTER — Ambulatory Visit: Payer: Medicaid Other | Admitting: Urology

## 2019-09-03 ENCOUNTER — Other Ambulatory Visit: Payer: Self-pay

## 2019-09-03 VITALS — BP 107/58 | HR 62 | Ht 71.0 in | Wt 225.0 lb

## 2019-09-03 DIAGNOSIS — Z87448 Personal history of other diseases of urinary system: Secondary | ICD-10-CM | POA: Diagnosis not present

## 2019-09-03 DIAGNOSIS — N433 Hydrocele, unspecified: Secondary | ICD-10-CM | POA: Diagnosis not present

## 2019-09-03 DIAGNOSIS — R3129 Other microscopic hematuria: Secondary | ICD-10-CM

## 2019-09-03 DIAGNOSIS — N5089 Other specified disorders of the male genital organs: Secondary | ICD-10-CM

## 2019-09-03 DIAGNOSIS — Z87438 Personal history of other diseases of male genital organs: Secondary | ICD-10-CM | POA: Diagnosis not present

## 2019-09-03 LAB — URINALYSIS, COMPLETE
Bilirubin, UA: NEGATIVE
Glucose, UA: NEGATIVE
Ketones, UA: NEGATIVE
Nitrite, UA: NEGATIVE
Protein,UA: NEGATIVE
Specific Gravity, UA: 1.025 (ref 1.005–1.030)
Urobilinogen, Ur: 0.2 mg/dL (ref 0.2–1.0)
pH, UA: 5 (ref 5.0–7.5)

## 2019-09-03 LAB — MICROSCOPIC EXAMINATION: Bacteria, UA: NONE SEEN

## 2019-09-03 LAB — BLADDER SCAN AMB NON-IMAGING

## 2019-09-05 LAB — CULTURE, URINE COMPREHENSIVE

## 2019-09-13 ENCOUNTER — Ambulatory Visit
Admission: RE | Admit: 2019-09-13 | Discharge: 2019-09-13 | Disposition: A | Discharge status: Home / Self Care | Payer: Medicaid Other | Source: Ambulatory Visit | Attending: Urology | Admitting: Urology

## 2019-09-13 ENCOUNTER — Other Ambulatory Visit: Payer: Self-pay

## 2019-09-13 DIAGNOSIS — R3129 Other microscopic hematuria: Secondary | ICD-10-CM | POA: Diagnosis not present

## 2019-09-13 DIAGNOSIS — N5089 Other specified disorders of the male genital organs: Secondary | ICD-10-CM

## 2019-09-13 LAB — POCT I-STAT CREATININE: Creatinine, Ser: 1.4 mg/dL — ABNORMAL HIGH (ref 0.61–1.24)

## 2019-09-13 MED ORDER — IOHEXOL 300 MG/ML  SOLN
125.0000 mL | Freq: Once | INTRAMUSCULAR | Status: AC | PRN
  Administered 2019-09-13: 125 mL via INTRAVENOUS

## 2019-09-19 ENCOUNTER — Other Ambulatory Visit: Payer: Medicaid Other | Admitting: Urology

## 2019-10-03 ENCOUNTER — Other Ambulatory Visit: Payer: Medicaid Other | Admitting: Urology

## 2019-10-05 ENCOUNTER — Other Ambulatory Visit: Payer: Medicaid Other | Admitting: Urology

## 2019-10-05 ENCOUNTER — Telehealth: Payer: Self-pay | Admitting: Pulmonary Disease

## 2019-10-05 NOTE — Telephone Encounter (Signed)
Left message x2 for pt. 

## 2019-10-05 NOTE — Telephone Encounter (Signed)
Left message to relayed date/time of covid test.  10/08/2019 [facility] to [facility]

## 2019-10-08 ENCOUNTER — Other Ambulatory Visit
Admission: RE | Admit: 2019-10-08 | Discharge: 2019-10-08 | Disposition: A | Discharge status: Home / Self Care | Payer: Medicaid Other | Source: Ambulatory Visit | Attending: Pulmonary Disease | Admitting: Pulmonary Disease

## 2019-10-08 ENCOUNTER — Other Ambulatory Visit: Payer: Self-pay

## 2019-10-08 DIAGNOSIS — Z01812 Encounter for preprocedural laboratory examination: Secondary | ICD-10-CM | POA: Insufficient documentation

## 2019-10-08 DIAGNOSIS — Z20828 Contact with and (suspected) exposure to other viral communicable diseases: Secondary | ICD-10-CM | POA: Diagnosis not present

## 2019-10-08 LAB — SARS CORONAVIRUS 2 (TAT 6-24 HRS): SARS Coronavirus 2: NEGATIVE

## 2019-10-08 NOTE — Telephone Encounter (Signed)
Pt is aware of date/time of covid test.   

## 2019-10-09 ENCOUNTER — Other Ambulatory Visit: Payer: Self-pay

## 2019-10-09 ENCOUNTER — Ambulatory Visit: Payer: Medicaid Other | Attending: Internal Medicine

## 2019-10-09 DIAGNOSIS — J449 Chronic obstructive pulmonary disease, unspecified: Secondary | ICD-10-CM | POA: Diagnosis not present

## 2019-10-10 ENCOUNTER — Encounter: Payer: Self-pay | Admitting: Urology

## 2019-10-10 ENCOUNTER — Other Ambulatory Visit: Payer: Medicaid Other | Admitting: Urology

## 2019-10-10 ENCOUNTER — Ambulatory Visit (INDEPENDENT_AMBULATORY_CARE_PROVIDER_SITE_OTHER): Payer: Medicaid Other | Admitting: Urology

## 2019-10-10 ENCOUNTER — Telehealth: Payer: Self-pay | Admitting: Urology

## 2019-10-10 VITALS — BP 109/71 | HR 74 | Ht 71.0 in | Wt 225.0 lb

## 2019-10-10 DIAGNOSIS — Z87448 Personal history of other diseases of urinary system: Secondary | ICD-10-CM

## 2019-10-10 DIAGNOSIS — R3129 Other microscopic hematuria: Secondary | ICD-10-CM | POA: Diagnosis not present

## 2019-10-10 DIAGNOSIS — N401 Enlarged prostate with lower urinary tract symptoms: Secondary | ICD-10-CM

## 2019-10-10 LAB — URINALYSIS, COMPLETE
Bilirubin, UA: NEGATIVE
Glucose, UA: NEGATIVE
Ketones, UA: NEGATIVE
Leukocytes,UA: NEGATIVE
Nitrite, UA: NEGATIVE
Protein,UA: NEGATIVE
Specific Gravity, UA: 1.025 (ref 1.005–1.030)
Urobilinogen, Ur: 0.2 mg/dL (ref 0.2–1.0)
pH, UA: 5.5 (ref 5.0–7.5)

## 2019-10-10 LAB — MICROSCOPIC EXAMINATION

## 2019-10-10 MED ORDER — LIDOCAINE HCL URETHRAL/MUCOSAL 2 % EX GEL
1.0000 "application " | Freq: Once | CUTANEOUS | Status: AC
  Administered 2019-10-10: 1 via URETHRAL

## 2019-10-10 MED ORDER — TAMSULOSIN HCL 0.4 MG PO CAPS: 0.4000 mg | ORAL_CAPSULE | Freq: Every day | ORAL | 2 refills | Status: DC

## 2019-10-10 NOTE — Telephone Encounter (Signed)
Pt LMOM and states that his RX was sent to wrong pharmacy. It is supposed to be sent to [facility]

## 2019-10-10 NOTE — Progress Notes (Signed)
   10/10/19  CC:  Chief Complaint  Patient presents with  . Cysto    HPI: Refer to Bradley Sawyer's note 09/03/2019.  CTU showed a 2.6 cm cyst of the right lower pole and prostate enlargement with a volume estimated at 59 cc.  He was incidentally noted to have a 3 cm infrarenal AAA and a 3-year follow-up ultrasound is recommended.  He had 2 left lower lobe pulmonary nodules and a chest CT and 6-12 months was recommended.  There were no vitals taken for this visit. NED. A&Ox3.   No respiratory distress   Abd soft, NT, ND Normal phallus with bilateral descended testicles  Cystoscopy Procedure Note  Patient identification was confirmed, informed consent was obtained, and patient was prepped using Betadine solution.  Lidocaine jelly was administered per urethral meatus.     Pre-Procedure: - Inspection reveals a normal caliber ureteral meatus.  Procedure: The flexible cystoscope was introduced without difficulty - No urethral strictures/lesions are present. - Prominent lateral lobe enlargement prostate, hypervascularity - Moderate median lobe  - Bilateral ureteral orifices identified - Bladder mucosa  reveals no ulcers, tumors, or lesions - No bladder stones - No trabeculation  Retroflexion shows no intravesical median lobe   Post-Procedure: - Patient tolerated the procedure well  Assessment/ Plan: -BPH.  He does have bothersome lower urinary tract symptoms and desires a trial of medical management.  Rx tamsulosin was sent to pharmacy and will have him follow-up with Bradley Beach in 4 weeks for assessment of medication efficacy and whether he desires to continue.  -Will let his PCP know the findings of AAA and pulmonary nodules and recommendation of follow-up imaging.   Return in about 4 weeks (around 11/07/2019) for Recheck with Bradley Beach.  Abbie Sons, MD

## 2019-10-10 NOTE — Telephone Encounter (Signed)
RX sent to [facility] for [employer] per [employer] note.

## 2019-10-12 ENCOUNTER — Encounter: Payer: Self-pay | Admitting: Urology

## 2019-10-16 ENCOUNTER — Ambulatory Visit: Payer: Medicaid Other | Admitting: Pulmonary Disease

## 2019-10-16 ENCOUNTER — Other Ambulatory Visit: Payer: Self-pay

## 2019-10-16 ENCOUNTER — Encounter: Payer: Self-pay | Admitting: Pulmonary Disease

## 2019-10-16 VITALS — BP 108/58 | HR 72 | Temp 97.4°F | Ht 71.0 in | Wt 217.0 lb

## 2019-10-16 DIAGNOSIS — J449 Chronic obstructive pulmonary disease, unspecified: Secondary | ICD-10-CM

## 2019-10-16 DIAGNOSIS — F17210 Nicotine dependence, cigarettes, uncomplicated: Secondary | ICD-10-CM

## 2019-10-16 MED ORDER — ALBUTEROL SULFATE (2.5 MG/3ML) 0.083% IN NEBU: 2.5000 mg | INHALATION_SOLUTION | Freq: Four times a day (QID) | RESPIRATORY_TRACT | 12 refills | Status: DC | PRN

## 2019-10-16 MED ORDER — FLUTICASONE-SALMETEROL 500-50 MCG/DOSE IN AEPB: 1.0000 | INHALATION_SPRAY | Freq: Two times a day (BID) | RESPIRATORY_TRACT | 11 refills | Status: DC

## 2019-10-16 MED ORDER — SPIRIVA HANDIHALER 18 MCG IN CAPS: 1.0000 | ORAL_CAPSULE | Freq: Every day | RESPIRATORY_TRACT | 11 refills | Status: DC

## 2019-10-16 NOTE — Patient Instructions (Signed)
1.  We refilled your medications.  2.  Continue using oxygen at nighttime.   3.  We will see him in follow-up in 3 months time.  Call sooner should any new difficulties arise.

## 2019-10-17 ENCOUNTER — Telehealth: Payer: Self-pay | Admitting: *Deleted

## 2019-10-17 DIAGNOSIS — Z122 Encounter for screening for malignant neoplasm of respiratory organs: Secondary | ICD-10-CM

## 2019-10-17 DIAGNOSIS — Z87891 Personal history of nicotine dependence: Secondary | ICD-10-CM

## 2019-10-17 NOTE — Addendum Note (Signed)
Addended by: Lieutenant Diego on: 10/17/2019 02:26 PM   Modules accepted: Orders

## 2019-10-17 NOTE — Telephone Encounter (Signed)
Received referral for low dose lung cancer screening CT scan. Message left at phone number listed in EMR for patient to call me back to facilitate scheduling scan.  

## 2019-10-17 NOTE — Telephone Encounter (Signed)
Received referral for initial lung cancer screening scan. Contacted patient and obtained smoking history,(current, 96 pack year) as well as answering questions related to screening process. Patient denies signs of lung cancer such as weight loss or hemoptysis. Patient denies comorbidity that would prevent curative treatment if lung cancer were found. Patient is scheduled for shared decision making visit and CT scan on 10/31/19 at 1030am.

## 2019-10-31 ENCOUNTER — Inpatient Hospital Stay: Payer: Medicaid Other | Attending: Oncology | Admitting: Oncology

## 2019-10-31 ENCOUNTER — Ambulatory Visit
Admission: RE | Admit: 2019-10-31 | Discharge: 2019-10-31 | Disposition: A | Discharge status: Home / Self Care | Payer: Medicaid Other | Source: Ambulatory Visit | Attending: Oncology | Admitting: Oncology

## 2019-10-31 ENCOUNTER — Other Ambulatory Visit: Payer: Self-pay

## 2019-10-31 DIAGNOSIS — Z87891 Personal history of nicotine dependence: Secondary | ICD-10-CM | POA: Diagnosis present

## 2019-10-31 DIAGNOSIS — Z122 Encounter for screening for malignant neoplasm of respiratory organs: Secondary | ICD-10-CM

## 2019-10-31 NOTE — Progress Notes (Signed)
Virtual Visit via Video Note  I connected with Bradley Sawyer on 10/31/19 at 10:30 AM EST by a video enabled telemedicine application and verified that I am speaking with the correct person using two identifiers.  Location: Patient: [facility] Provider: Office   I discussed the limitations of evaluation and management by telemedicine and the availability of in person appointments. The patient expressed understanding and agreed to proceed.  I discussed the assessment and treatment plan with the patient. The patient was provided an opportunity to ask questions and all were answered. The patient agreed with the plan and demonstrated an understanding of the instructions.   The patient was advised to call back or seek an in-person evaluation if the symptoms worsen or if the condition fails to improve as anticipated.   In accordance with CMS guidelines, patient has met eligibility criteria including age, absence of signs or symptoms of lung cancer.  Social History   Tobacco Use  . Smoking status: Heavy Tobacco Smoker    Packs/day: 2.00    Years: 48.00    Pack years: 96.00    Types: Cigarettes  . Smokeless tobacco: Never Used  . Tobacco comment: 4-5 cigarettes daily- 10/16/2019  Substance Use Topics  . Alcohol use: No    Alcohol/week: 0.0 standard drinks  . Drug use: No    Types: Cocaine, Marijuana    Comment: no recent use      A shared decision-making session was conducted prior to the performance of CT scan. This includes one or more decision aids, includes benefits and harms of screening, follow-up diagnostic testing, over-diagnosis, false positive rate, and total radiation exposure.   Counseling on the importance of adherence to annual lung cancer LDCT screening, impact of co-morbidities, and ability or willingness to undergo diagnosis and treatment is imperative for compliance of the program.   Counseling on the importance of continued smoking cessation for former smokers; the importance  of smoking cessation for current smokers, and information about tobacco cessation interventions have been given to patient including [place] and 1800 quit [place] programs.   Written order for lung cancer screening with LDCT has been given to the patient and any and all questions have been answered to the best of my abilities.    Yearly follow up will be coordinated by Burgess Estelle, Thoracic Navigator.  I provided 15 minutes of face-to-face video visit time during this encounter, and > 50% was spent counseling as documented under my assessment & plan.   Jacquelin Hawking, NP

## 2019-11-01 ENCOUNTER — Telehealth: Payer: Self-pay | Admitting: *Deleted

## 2019-11-01 NOTE — Telephone Encounter (Signed)
Thank you :)

## 2019-11-01 NOTE — Telephone Encounter (Signed)
Notified patient of LDCT lung cancer screening program results with recommendation for 12 month follow up imaging. Also notified of incidental findings noted below and is encouraged to discuss further with PCP who will receive a copy of this note and/or the CT report. Patient verbalizes understanding.   IMPRESSION: Lung-RADS 2, benign appearance or behavior. Continue annual screening with low-dose chest CT without contrast in 12 months.  Aortic Atherosclerosis (ICD10-I70.0) and Emphysema (ICD10-J43.9).

## 2019-11-06 ENCOUNTER — Ambulatory Visit: Payer: Medicaid Other | Admitting: Urology

## 2019-11-19 ENCOUNTER — Ambulatory Visit: Payer: Medicaid Other | Admitting: Urology

## 2019-12-04 ENCOUNTER — Ambulatory Visit: Payer: Medicaid Other | Admitting: Urology

## 2019-12-18 ENCOUNTER — Other Ambulatory Visit: Payer: Self-pay

## 2019-12-18 ENCOUNTER — Ambulatory Visit (INDEPENDENT_AMBULATORY_CARE_PROVIDER_SITE_OTHER): Payer: Medicaid Other | Admitting: Urology

## 2019-12-18 ENCOUNTER — Encounter: Payer: Self-pay | Admitting: Urology

## 2019-12-18 VITALS — BP 146/75 | HR 76 | Ht 71.0 in | Wt 216.0 lb

## 2019-12-18 DIAGNOSIS — Z87438 Personal history of other diseases of male genital organs: Secondary | ICD-10-CM | POA: Diagnosis not present

## 2019-12-18 DIAGNOSIS — N401 Enlarged prostate with lower urinary tract symptoms: Secondary | ICD-10-CM

## 2019-12-18 DIAGNOSIS — R351 Nocturia: Secondary | ICD-10-CM | POA: Diagnosis not present

## 2019-12-18 DIAGNOSIS — Z87448 Personal history of other diseases of urinary system: Secondary | ICD-10-CM

## 2019-12-18 LAB — BLADDER SCAN AMB NON-IMAGING: Scan Result: 47

## 2019-12-18 MED ORDER — TAMSULOSIN HCL 0.4 MG PO CAPS: 0.4000 mg | ORAL_CAPSULE | Freq: Every day | ORAL | 3 refills | Status: DC

## 2019-12-18 NOTE — Progress Notes (Signed)
12/18/2019 12:18 PM   Bradley Sawyer [place] 1956/05/11 [id number]  Referring provider: Curly Rim, MD [place] [address],  [place] [address]  Chief Complaint  Patient presents with  . Hematuria    HPI: Bradley Sawyer is a 64 year old male with a history of hematuria, history of orchitis and hydrocele who presents today for incomplete bladder empyting.    History of hematuria (high risk) Former smoker - quit less than one year ago.  Contrast CT in 01/2017 revealed a small cyst at inferior pole RIGHT kidney 2.2 x 1.7 cm image 41. Adrenal glands, kidneys, ureters, and bladder otherwise normal appearance.  Minimal prostatic enlargement with scattered prostatic calcifications. Seminal vesicles unremarkable.  Cystoscopy in 03/2017 with Dr. Pilar Jarvis noted an enlarged prostate Visually obstructive - 4 cm in length.  CTU 09/13/2019 The prostate gland indents the bladder base.  Adrenal glands normal. 2.6 by 2.1 cm right kidney lower pole fluid density lesion without enhancement, compatible with simple cyst.  Small vascular calcification in the left renal hilum. No urinary tract calculus observed. No significant abnormal filling defect or abnormal enhancement along the urothelium.  The prostate gland measures 5.2 by 4.2 by 5.2 cm (volume = 59 cm^3) and indents the bladder base. Similar pattern of left posterolateral peripheral zone calcifications near the apex.  Cysto with Dr. Bernardo Heater 09/2019 Prominent lateral lobe enlargement prostate, hypervascularity - Moderate median lobe.  No reports of gross hematuria.  UA 12/18/2019 negative for AMH.    History of orchitis/hydrocele Scrotal ultrasound in 03/2017 revealed no evidence of testicular torsion.  New rounded hypoechoic lesion in upper left testicle which shows mild hypervascularity. This is suspicious for orchitis given evidence of associated epididymitis and its new appearance since prior study approximately 1 month ago. Testicular neoplasm is  considered less likely, and follow-up by ultrasound is recommended in 2-3 months.  Probable mild left epididymitis.  Moderate complex left hydrocele, and small right hydrocele.  Tumor markers were negative.  Bradley did not follow up with repeat imaging as Bradley said Bradley wasn't informed that Bradley needed a follow up ultrasound.  Bradley did seek a second opinion regarding our recommendation in 05/2017 with urology in [facility] and they also recommended a follow up scrotal ultrasound which Bradley did not follow up with the repeat imaging.  SUS 08/2019 Normal-appearing testicles bilaterally. The previously seen area of decreased echogenicity within the left testicle as resolved.  Epididymal cysts bilaterally stable in appearance from the prior Exam.  Bilateral hydroceles are noted although improved from the prior study particularly on the left.  BPH WITH LUTS  (prostate and/or bladder) I PSS: 7/4    PVR:  47 mL     Previous IPSS score: 15/3    Previous PVR: 30 mL   Major complaint(s):  Nocturia and intermittency x several years.   Feels there is improvement on tamsulosin 0.4 mg daily.  Patient denies any modifying or aggravating factors.  Patient denies any gross hematuria, dysuria or suprapubic/flank pain.  Patient denies any fevers, chills, nausea or vomiting.   Prostate 59 cc.    Bradley does not have any family history of prostate cancer.   IPSS    Row Name 12/18/19 1100         International Prostate Symptom Score   How often have you had the sensation of not emptying your bladder?  Not at All     How often have you had to urinate less than every  two hours?  Less than half the time     How often have you found you stopped and started again several times when you urinated?  Less than half the time     How often have you found it difficult to postpone urination?  Not at All     How often have you had a weak urinary stream?  Not at All     How often have you had to strain to start urination?  Not at All     How many  times did you typically get up at night to urinate?  3 Times     Total IPSS Score  7       Quality of Life due to urinary symptoms   If you were to spend the rest of your life with your urinary condition just the way it is now how would you feel about that?  Mostly Disatisfied        Score:  1-7 Mild 8-19 Moderate 20-35 Severe     PMH: Past Medical History:  Diagnosis Date  . Anxiety   . Arthritis   . 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 Cocaine dependence    Last use 1998, attends 3 NA meetings weekly.  . Compression fracture    L2  . COPD (chronic obstructive pulmonary disease) (HCC)    bronchitis  . CVA (cerebral infarction)   . Disorder of vocal cord   . Fatty liver   . GERD (gastroesophageal reflux disease)   . Hx of cardiovascular stress test    ETT/Lexiscan-Myoview (10/15):  Inferior, inferoapical, apical lateral defect c/w mild ischemia, EF 66%; Intermediate Risk  . Hyperlipidemia   . Hypertension   . Right shoulder pain    2/2 partial rotator cuff tear, tendinopathy, mild subacromial/subdeltioid bursitis, A/C joint arthropathy per MRI done in 3/07  . Tobacco abuse     Surgical History: Past Surgical History:  Procedure Laterality Date  . ANTERIOR LAT LUMBAR FUSION N/A 12/18/2013   Procedure: Lumbar Two Anteriorlateral Corpectomy w/ cage, interbody fusion, anterior plating, Lumbar One to Lumbar Three percutaneous pedicle screws;  Surgeon: Charlie Pitter, MD;  Location: [place] [facility];  Service: Neurosurgery;  Laterality: N/A;  . AORTA -INNOMIATE BYPASS N/A 10/15/2014   Procedure: ENDARTERCTOMY OF  INNOMINATE ARTERY ;  Surgeon: Elam Dutch, MD;  Location: [place];  Service: Vascular;  Laterality: N/A;  . ARCH AORTOGRAM N/A 08/23/2014   Procedure: ARCH AORTOGRAM;  Surgeon: Elam Dutch, MD;  Location: [facility] CATH LAB;  Service: Cardiovascular;  Laterality: N/A;  . arch aortogram, left carotid,left subclavian angiogram  08-23-2014  .  CAROTID ANGIOGRAM Left 08/23/2014   Procedure: CAROTID ANGIOGRAM;  Surgeon: Elam Dutch, MD;  Location: [facility] CATH LAB;  Service: Cardiovascular;  Laterality: Left;  . CHOLECYSTECTOMY  12/2016  . ENDARTERECTOMY Right 10/15/2014   Procedure: ENDARTERECTOMY RIGHT SUBCLAVIAN ARTERY;  Surgeon: Elam Dutch, MD;  Location: [place];  Service: Vascular;  Laterality: Right;  . ENDARTERECTOMY Right 10/15/2014   Procedure: ENDARTERECTOMY RIGHT COMMON CAROTID ARTERY;  Surgeon: Elam Dutch, MD;  Location: [place];  Service: Vascular;  Laterality: Right;  . 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 LEFT HEART CATHETERIZATION WITH CORONARY ANGIOGRAM N/A 09/12/2014   Procedure: LEFT HEART CATHETERIZATION WITH CORONARY ANGIOGRAM;  Surgeon: Josue Hector, MD;  Location: [facility] CATH LAB;  Service: Cardiovascular;  Laterality: N/A;  . LUMBAR SPINE SURGERY  12/18/2013   L2  DR POOL  . NECK SURGERY    . PATCH ANGIOPLASTY Right 10/15/2014   Procedure: PATCH ANGIOPLASTY OF RIGHT SUBCLAVIAN ARTERY , RIGHT COMMON CAROTID ARTERY & INNOMINATE ARTERY;  Surgeon: Elam Dutch, MD;  Location: [place];  Service: Vascular;  Laterality: Right;  . STERNOTOMY N/A 10/15/2014   Procedure: PARTIAL STERNOTOMY & PLATING OF STERNUM;  Surgeon: Elam Dutch, MD;  Location: [place];  Service: Vascular;  Laterality: N/A;  . TEE WITHOUT CARDIOVERSION N/A 12/25/2013   Procedure: TRANSESOPHAGEAL ECHOCARDIOGRAM (TEE);  Surgeon: Larey Dresser, MD;  Location: [place];  Service: Cardiovascular;  Laterality: N/A;    Home Medications:  Allergies as of 12/18/2019   No Known Allergies     Medication List       Accurate as of December 18, 2019 12:18 PM. If you have any questions, ask your nurse or doctor.        amLODipine 5 MG tablet Commonly known as: NORVASC Take by mouth.   aspirin EC 81 MG tablet Take 81 mg by mouth daily.   atorvastatin 10 MG tablet Commonly known as: Lipitor Take 1 tablet (10 mg total) by  mouth daily.   cyclobenzaprine 10 MG tablet Commonly known as: FLEXERIL   docusate sodium 100 MG capsule Commonly known as: COLACE Take 1 capsule (100 mg total) by mouth 2 (two) times daily.   FLUoxetine 10 MG capsule Commonly known as: PROZAC Take 1 capsule (10 mg total) by mouth daily.   Fluticasone-Salmeterol 500-50 MCG/DOSE Aepb Commonly known as: Advair Diskus Inhale 1 puff into the lungs 2 (two) times daily.   isosorbide mononitrate 30 MG 24 hr tablet Commonly known as: IMDUR Take by mouth.   ketorolac 10 MG tablet Commonly known as: TORADOL Take 1 tablet (10 mg total) by mouth every 6 (six) hours as needed.   linaclotide 290 MCG Caps capsule Commonly known as: LINZESS Take by mouth.   lisinopril-hydrochlorothiazide 20-12.5 MG tablet Commonly known as: ZESTORETIC TAKE 1 TABLET BY MOUTH DAILY.   meloxicam 15 MG tablet Commonly known as: MOBIC TAKE 1 TABLET BY MOUTH EVERY DAY   metoprolol tartrate 100 MG tablet Commonly known as: LOPRESSOR Take by mouth.   Multi-Vitamins Tabs Take by mouth.   mupirocin ointment 2 % Commonly known as: BACTROBAN Apply to inside of each nostril twice daily for 7 days   Narcan 4 MG/0.1ML Liqd nasal spray kit Generic drug: naloxone INHALE ONE SPRAY EACH NOSTRIL FOR OPIOID EMERGENCY   ondansetron 4 MG tablet Commonly known as: Zofran Take 1 tablet (4 mg total) by mouth every 8 (eight) hours as needed for nausea or vomiting.   oxyCODONE 15 MG immediate release tablet Commonly known as: ROXICODONE Take by mouth.   pantoprazole 40 MG tablet Commonly known as: PROTONIX TAKE 1 TABLET (40 MG TOTAL) BY MOUTH DAILY.   polyethylene glycol 17 g packet Commonly known as: MIRALAX / GLYCOLAX Take 17 g by mouth daily as needed (for constipation).   pregabalin 150 MG capsule Commonly known as: LYRICA Take by mouth.   ProAir HFA 108 (90 Base) MCG/ACT inhaler Generic drug: albuterol INHALE 2 PUFFS INTO THE LUNGS EVERY 6 (SIX)  HOURS AS NEEDED FOR WHEEZING.   albuterol (2.5 MG/3ML) 0.083% nebulizer solution Commonly known as: PROVENTIL Take 3 mLs (2.5 mg total) by nebulization every 6 (six) hours as needed for wheezing or shortness of breath.   promethazine 12.5 MG tablet Commonly known as: PHENERGAN Take 2 tablets (25 mg total) by mouth every  6 (six) hours as needed for nausea or vomiting.   rOPINIRole 1 MG tablet Commonly known as: REQUIP Take 1 mg by mouth at bedtime.   Spiriva HandiHaler 18 MCG inhalation capsule Generic drug: tiotropium Place 1 capsule (18 mcg total) into inhaler and inhale daily.   tamsulosin 0.4 MG Caps capsule Commonly known as: FLOMAX Take 1 capsule (0.4 mg total) by mouth daily.       Allergies: No Known Allergies  Family History: Family History  Problem Relation Age of Onset  . Heart disease Father   . Heart attack Father   . Diabetes Paternal Grandmother        1 st degree relatives  . Hypertension Paternal Grandmother   . Diabetes Brother   . Heart disease Brother   . Muscular dystrophy Brother   . Muscular dystrophy Sister   . Prostate cancer Neg Hx   . Kidney cancer Neg Hx   . Bladder Cancer Neg Hx     Social History:  reports that Bradley has been smoking cigarettes. Bradley has a 96.00 pack-year smoking history. Bradley has never used smokeless tobacco. Bradley reports that Bradley does not drink alcohol or use drugs.  ROS: UROLOGY Frequent Urination?: No Hard to postpone urination?: No Burning/pain with urination?: Yes Get up at night to urinate?: No Leakage of urine?: Yes Urine stream starts and stops?: No Trouble starting stream?: Yes Do you have to strain to urinate?: No Blood in urine?: No Urinary tract infection?: No Sexually transmitted disease?: No Injury to kidneys or bladder?: No Painful intercourse?: No Weak stream?: No Erection problems?: No Penile pain?: No  Gastrointestinal Nausea?: No Vomiting?: No Indigestion/heartburn?: No Diarrhea?:  No Constipation?: Yes  Constitutional Fever: No Night sweats?: No Weight loss?: No Fatigue?: No  Skin Skin rash/lesions?: No Itching?: No  Eyes Blurred vision?: No Double vision?: No  Ears/Nose/Throat Sore throat?: No Sinus problems?: No  Hematologic/Lymphatic Swollen glands?: No Easy bruising?: No  Cardiovascular Leg swelling?: No Chest pain?: No  Respiratory Cough?: No Shortness of breath?: Yes  Endocrine Excessive thirst?: No  Musculoskeletal Back pain?: Yes Joint pain?: No  Neurological Headaches?: No Dizziness?: No  Psychologic Depression?: No Anxiety?: No  Physical Exam: BP (!) 146/75   Pulse 76   Ht '5\' 11"'  (1.803 m)   Wt 216 lb (98 kg)   BMI 30.13 kg/m   Constitutional:  Well nourished. Alert and oriented, No acute distress. HEENT: [place] AT, mask in place.  Trachea midline, no masses. Cardiovascular: No clubbing, cyanosis, or edema. Respiratory: Normal respiratory effort, no increased work of breathing. Neurologic: Grossly intact, no focal deficits, moving all 4 extremities. Psychiatric: Normal mood and affect.  Laboratory Data: PSA Trend  0.4 on 10/21/2015  0.5 on 12/05/2019 Lab Results  Component Value Date   WBC 15.1 (H) 02/20/2017   HGB 13.0 02/20/2017   HCT 38.0 (L) 02/20/2017   MCV 91.3 02/20/2017   PLT 162 02/20/2017    Lab Results  Component Value Date   CREATININE 1.40 (H) 09/13/2019    No results found for: PSA  No results found for: TESTOSTERONE  Lab Results  Component Value Date   HGBA1C 5.8 05/08/2016    Lab Results  Component Value Date   TSH 0.449 05/08/2016       Component Value Date/Time   CHOL 179 02/11/2015 1413   HDL 36 (L) 02/11/2015 1413   CHOLHDL 5.0 02/11/2015 1413   VLDL NOT CALC 02/11/2015 1413   LDLCALC NOT CALC 02/11/2015 1413  Lab Results  Component Value Date   AST 17 02/29/2016   Lab Results  Component Value Date   ALT 23 02/29/2016   No components found for:  ALKALINEPHOPHATASE No components found for: BILIRUBINTOTAL  No results found for: ESTRADIOL  Urinalysis Component     Latest Ref Rng & Units 12/18/2019  Specific Gravity, UA     1.005 - 1.030 1.015  pH, UA     5.0 - 7.5 5.0  Color, UA     Yellow Yellow  Appearance Ur     Clear Hazy (A)  Leukocytes,UA     Negative 1+ (A)  Protein,UA     Negative/Trace Negative  Glucose, UA     Negative Negative  Ketones, UA     Negative Negative  RBC, UA     Negative 2+ (A)  Bilirubin, UA     Negative Negative  Urobilinogen, Ur     0.2 - 1.0 mg/dL 0.2  Nitrite, UA     Negative Negative  Microscopic Examination      See below:   Component     Latest Ref Rng & Units 12/18/2019  WBC, UA     0 - 5 /hpf >30 (A)  RBC     0 - 2 /hpf 0-2  Epithelial Cells (non renal)     0 - 10 /hpf 0-10  Casts     None seen /lpf Present (A)  Cast Type     N/A Hyaline casts  Bacteria, UA     None seen/Few Many (A)     I have reviewed the labs.   Pertinent Imaging: Results for Bradley, Sawyer (MRN [id number]) as of 12/18/2019 11:58  Ref. Range 12/18/2019 11:36  Scan Result Unknown 47     Assessment & Plan:    1. History of hematuria Hematuria work up completed in 08/2019 - findings positive for right renal cyst with prominent lateral lobe enlargement prostate, hypervascularity.  Moderate median lobe  No report of gross hematuria  UA today 0-2 RTC in one year for UA - patient to report any gross hematuria in the interim    2. History of orchitis/hydrocele Resolved  3. BPH with LUTS IPSS score is 7/4, it is improved  Continue conservative management, avoiding bladder irritants and timed voiding's Most bothersome symptoms is/are hesitancy Discussed bladder outlet procedure, Bradley is not interested in pursuing surgery at this time  Continue tamsulosin 0.4 mg daily RTC in one year for I PSS, PVR and exam  4. Pulmonary nodule Chest CT in 10/2019 with PCP - recommend to repeat in one  year  5. Infrarenal abdominal aortic aneurysm PCP notified - needs repeat ultrasound 2023   Return in about 1 year (around 12/17/2020) for IPSS, PVR, UA and exam.  These notes generated with voice recognition software. I apologize for typographical errors.  Zara Council, PA-C  [facility] [address]  [place], [place] [address] [phone]

## 2019-12-19 LAB — URINALYSIS, COMPLETE
Bilirubin, UA: NEGATIVE
Glucose, UA: NEGATIVE
Ketones, UA: NEGATIVE
Nitrite, UA: NEGATIVE
Protein,UA: NEGATIVE
Specific Gravity, UA: 1.015 (ref 1.005–1.030)
Urobilinogen, Ur: 0.2 mg/dL (ref 0.2–1.0)
pH, UA: 5 (ref 5.0–7.5)

## 2019-12-19 LAB — MICROSCOPIC EXAMINATION: WBC, UA: >30 /hpf — AB (ref 0–5)

## 2020-01-30 ENCOUNTER — Encounter: Payer: Self-pay | Admitting: Pulmonary Disease

## 2020-01-30 ENCOUNTER — Ambulatory Visit: Payer: Medicaid Other | Admitting: Pulmonary Disease

## 2020-01-30 DIAGNOSIS — J449 Chronic obstructive pulmonary disease, unspecified: Secondary | ICD-10-CM

## 2020-01-30 DIAGNOSIS — J4489 Other specified chronic obstructive pulmonary disease: Secondary | ICD-10-CM

## 2020-01-30 NOTE — Progress Notes (Signed)
 Assessment & Plan:  There are no diagnoses linked to this encounter.  There are no Patient Instructions on file for this visit.  Please note: late entry documentation due to logistical difficulties during COVID-19 pandemic. This note is filed for information purposes only, and is not intended to be used for billing, nor does it represent the full scope/nature of the visit in question. Please see any associated scanned media linked to date of encounter for additional pertinent information.  Subjective:    HPI: Bradley Sawyer is a 64 y.o. male presenting to the pulmonology clinic on 01/30/2020 with report of: No chief complaint on file.  Virtual Visit Via Video or Telephone Note:   This visit type was conducted due to national recommendations for restrictions regarding the COVID-19 pandemic .  This format is felt to be most appropriate for this patient at this time.  All issues noted in this document were discussed and addressed.  No physical exam was performed (except for noted visual exam findings with Video Visits).    I connected with XXX  by a video enabled telemedicine application or telephone at 11:30 AM and verified that I was speaking with the correct person using two identifiers. Location patient: home Location provider: [facility]- Persons participating in the virtual visit: patient, physician   I discussed the limitations, risks, security and privacy concerns of performing an evaluation and management service by video and the availability of in person appointments. The patient expressed understanding and agreed to proceed.   Outpatient Encounter Medications as of 01/30/2020  Medication Sig Note   aspirin  EC 81 MG tablet Take 81 mg by mouth daily.    isosorbide  mononitrate (IMDUR ) 30 MG 24 hr tablet Take 30 mg by mouth daily.    linaclotide  (LINZESS ) 290 MCG CAPS capsule Take 290 mcg by mouth daily before breakfast.    Multiple Vitamin (MULTI-VITAMINS)  TABS Take 1 tablet by mouth daily.    naloxone  (NARCAN ) nasal spray 4 mg/0.1 mL Place 0.4 mg into the nose once. 03/23/2017: Patient states has not had to use this   polyethylene glycol (MIRALAX  / GLYCOLAX ) packet Take 17 g by mouth daily as needed (for constipation).    pregabalin  (LYRICA ) 150 MG capsule Take 150 mg by mouth daily.    [DISCONTINUED] albuterol  (PROVENTIL ) (2.5 MG/3ML) 0.083% nebulizer solution Take 3 mLs (2.5 mg total) by nebulization every 6 (six) hours as needed for wheezing or shortness of breath.    [DISCONTINUED] amLODipine  (NORVASC ) 5 MG tablet Take 5 mg by mouth daily.    [DISCONTINUED] atorvastatin  (LIPITOR ) 10 MG tablet Take 1 tablet (10 mg total) by mouth daily.    [DISCONTINUED] cyclobenzaprine  (FLEXERIL ) 10 MG tablet Take 10 mg by mouth daily.    [DISCONTINUED] docusate sodium  (COLACE) 100 MG capsule Take 1 capsule (100 mg total) by mouth 2 (two) times daily. (Patient not taking: Reported on 02/19/2021)    [DISCONTINUED] FLUoxetine  (PROZAC ) 10 MG capsule Take 1 capsule (10 mg total) by mouth daily.    [DISCONTINUED] Fluticasone -Salmeterol (ADVAIR DISKUS) 500-50 MCG/DOSE AEPB Inhale 1 puff into the lungs 2 (two) times daily.    [DISCONTINUED] ketorolac  (TORADOL ) 10 MG tablet Take 1 tablet (10 mg total) by mouth every 6 (six) hours as needed. (Patient not taking: Reported on 02/19/2021)    [DISCONTINUED] lisinopril -hydrochlorothiazide  (PRINZIDE ,ZESTORETIC ) 20-12.5 MG per tablet TAKE 1 TABLET BY MOUTH DAILY. (Patient taking differently: Take 1 tablet by mouth daily.)    [DISCONTINUED] meloxicam  (MOBIC ) 15 MG tablet TAKE  1 TABLET BY MOUTH EVERY DAY (Patient taking differently: Take 15 mg by mouth daily.)    [DISCONTINUED] metoprolol  (LOPRESSOR ) 100 MG tablet Take 100 mg by mouth 2 (two) times daily.    [DISCONTINUED] mupirocin ointment (BACTROBAN) 2 % Apply to inside of each nostril twice daily for 7 days    [DISCONTINUED] ondansetron  (ZOFRAN ) 4 MG tablet Take 1 tablet (4 mg  total) by mouth every 8 (eight) hours as needed for nausea or vomiting. (Patient not taking: Reported on 02/19/2021)    [DISCONTINUED] oxyCODONE  (ROXICODONE ) 15 MG immediate release tablet Take 15 mg by mouth See admin instructions. Six time a day    [DISCONTINUED] pantoprazole  (PROTONIX ) 40 MG tablet TAKE 1 TABLET (40 MG TOTAL) BY MOUTH DAILY. (Patient taking differently: Take 40 mg by mouth daily.)    [DISCONTINUED] PROAIR  HFA 108 (90 BASE) MCG/ACT inhaler INHALE 2 PUFFS INTO THE LUNGS EVERY 6 (SIX) HOURS AS NEEDED FOR WHEEZING. (Patient taking differently: Inhale 2 puffs into the lungs every 6 (six) hours as needed for shortness of breath or wheezing.)    [DISCONTINUED] promethazine  (PHENERGAN ) 12.5 MG tablet Take 2 tablets (25 mg total) by mouth every 6 (six) hours as needed for nausea or vomiting.    [DISCONTINUED] rOPINIRole  (REQUIP ) 1 MG tablet Take 1 mg by mouth at bedtime.    [DISCONTINUED] tamsulosin  (FLOMAX ) 0.4 MG CAPS capsule Take 1 capsule (0.4 mg total) by mouth daily.    [DISCONTINUED] tiotropium (SPIRIVA  HANDIHALER) 18 MCG inhalation capsule Place 1 capsule (18 mcg total) into inhaler and inhale daily.    No facility-administered encounter medications on file as of 01/30/2020.      Objective:   There were no vitals filed for this visit.   Physical exam documentation is limited by delayed entry of information.

## 2020-06-02 ENCOUNTER — Other Ambulatory Visit: Payer: Self-pay | Admitting: *Deleted

## 2020-06-02 DIAGNOSIS — I6523 Occlusion and stenosis of bilateral carotid arteries: Secondary | ICD-10-CM

## 2020-06-02 DIAGNOSIS — I739 Peripheral vascular disease, unspecified: Secondary | ICD-10-CM

## 2020-06-09 ENCOUNTER — Ambulatory Visit (INDEPENDENT_AMBULATORY_CARE_PROVIDER_SITE_OTHER): Payer: Medicaid Other | Admitting: Physician Assistant

## 2020-06-09 ENCOUNTER — Ambulatory Visit (INDEPENDENT_AMBULATORY_CARE_PROVIDER_SITE_OTHER)
Admission: RE | Admit: 2020-06-09 | Discharge: 2020-06-09 | Disposition: A | Discharge status: Home / Self Care | Payer: Medicaid Other | Source: Ambulatory Visit | Attending: Surgery | Admitting: Surgery

## 2020-06-09 ENCOUNTER — Other Ambulatory Visit: Payer: Self-pay

## 2020-06-09 ENCOUNTER — Ambulatory Visit (HOSPITAL_COMMUNITY)
Admission: RE | Admit: 2020-06-09 | Discharge: 2020-06-09 | Disposition: A | Discharge status: Home / Self Care | Payer: Medicaid Other | Source: Ambulatory Visit | Attending: Surgery | Admitting: Surgery

## 2020-06-09 VITALS — BP 108/64 | HR 63 | Temp 97.7°F | Resp 20 | Ht 71.0 in | Wt 211.3 lb

## 2020-06-09 DIAGNOSIS — I739 Peripheral vascular disease, unspecified: Secondary | ICD-10-CM

## 2020-06-09 DIAGNOSIS — I6523 Occlusion and stenosis of bilateral carotid arteries: Secondary | ICD-10-CM | POA: Insufficient documentation

## 2020-06-09 NOTE — Progress Notes (Signed)
Office Note     CC:  follow up Requesting Provider:  Curly Rim, MD  HPI: Bradley Sawyer is a 64 y.o. (25-Nov-1955) male who presents for surveillance follow up of carotid stenosis. He isHe states he has been having this left temporal/ parietal tingling sensation for the past several months. Says it comes and goes. Has happened 2-3 times. He was concerned it was something to do with his stroke. Otherwise he denies any vision changes, slurred speech, weakness or numbness of upper or lower extremities.  He denies any lower extremity claudication, rest pain or non healing wounds  He reports that he had cervical spine surgery with anterior exposure in December. Otherwise his PMHx, Social Hx, Surgical hx and Family hx were reviewed and have not changed since his last visit  The pt is on a statin for cholesterol management.  The pt is on a daily aspirin.   Other AC: none The pt is on CCB, ACE, BB for hypertension.   The pt is not diabetic.   Tobacco hx:  Former, quit 8 months ago  Past Medical History:  Diagnosis Date  . Anxiety   . Arthritis   . 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 Cocaine dependence    Last use 1998, attends 3 NA meetings weekly.  . Compression fracture    L2  . COPD (chronic obstructive pulmonary disease) (HCC)    bronchitis  . CVA (cerebral infarction)   . Disorder of vocal cord   . Fatty liver   . GERD (gastroesophageal reflux disease)   . Hx of cardiovascular stress test    ETT/Lexiscan-Myoview (10/15):  Inferior, inferoapical, apical lateral defect c/w mild ischemia, EF 66%; Intermediate Risk  . Hyperlipidemia   . Hypertension   . Right shoulder pain    2/2 partial rotator cuff tear, tendinopathy, mild subacromial/subdeltioid bursitis, A/C joint arthropathy per MRI done in 3/07  . Tobacco abuse     Past Surgical History:  Procedure Laterality Date  . ANTERIOR LAT LUMBAR FUSION N/A 12/18/2013   Procedure: Lumbar Two  Anteriorlateral Corpectomy w/ cage, interbody fusion, anterior plating, Lumbar One to Lumbar Three percutaneous pedicle screws;  Surgeon: Charlie Pitter, MD;  Location: [place] [facility];  Service: Neurosurgery;  Laterality: N/A;  . AORTA -INNOMIATE BYPASS N/A 10/15/2014   Procedure: ENDARTERCTOMY OF  INNOMINATE ARTERY ;  Surgeon: Elam Dutch, MD;  Location: [place];  Service: Vascular;  Laterality: N/A;  . ARCH AORTOGRAM N/A 08/23/2014   Procedure: ARCH AORTOGRAM;  Surgeon: Elam Dutch, MD;  Location: [facility] CATH LAB;  Service: Cardiovascular;  Laterality: N/A;  . arch aortogram, left carotid,left subclavian angiogram  08-23-2014  . CAROTID ANGIOGRAM Left 08/23/2014   Procedure: CAROTID ANGIOGRAM;  Surgeon: Elam Dutch, MD;  Location: [facility] CATH LAB;  Service: Cardiovascular;  Laterality: Left;  . CHOLECYSTECTOMY  12/2016  . ENDARTERECTOMY Right 10/15/2014   Procedure: ENDARTERECTOMY RIGHT SUBCLAVIAN ARTERY;  Surgeon: Elam Dutch, MD;  Location: [place];  Service: Vascular;  Laterality: Right;  . ENDARTERECTOMY Right 10/15/2014   Procedure: ENDARTERECTOMY RIGHT COMMON CAROTID ARTERY;  Surgeon: Elam Dutch, MD;  Location: [place];  Service: Vascular;  Laterality: Right;  . 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 LEFT HEART CATHETERIZATION WITH CORONARY ANGIOGRAM N/A 09/12/2014   Procedure: LEFT HEART CATHETERIZATION WITH CORONARY ANGIOGRAM;  Surgeon: Josue Hector, MD;  Location: [facility] CATH LAB;  Service: Cardiovascular;  Laterality: N/A;  . LUMBAR SPINE SURGERY  12/18/2013   L2     DR POOL  . NECK SURGERY    . PATCH ANGIOPLASTY Right 10/15/2014   Procedure: PATCH ANGIOPLASTY OF RIGHT SUBCLAVIAN ARTERY , RIGHT COMMON CAROTID ARTERY & INNOMINATE ARTERY;  Surgeon: Elam Dutch, MD;  Location: [place];  Service: Vascular;  Laterality: Right;  . STERNOTOMY N/A 10/15/2014   Procedure: PARTIAL STERNOTOMY & PLATING OF STERNUM;  Surgeon: Elam Dutch, MD;  Location: [place];  Service:  Vascular;  Laterality: N/A;  . TEE WITHOUT CARDIOVERSION N/A 12/25/2013   Procedure: TRANSESOPHAGEAL ECHOCARDIOGRAM (TEE);  Surgeon: Larey Dresser, MD;  Location: [facility] ENDOSCOPY;  Service: Cardiovascular;  Laterality: N/A;    Social History   Socioeconomic History  . Marital status: Divorced    Spouse name: Not on file  . Number of children: 1  . Years of education: GED  . Highest education level: Not on file  Occupational History  . Occupation: Disabled  Tobacco Use  . Smoking status: Heavy Tobacco Smoker    Packs/day: 2.00    Years: 48.00    Pack years: 96.00    Types: Cigarettes  . Smokeless tobacco: Never Used  . Tobacco comment: 4-5 cigarettes daily- 10/16/2019  Substance and Sexual Activity  . Alcohol use: No    Alcohol/week: 0.0 standard drinks  . Drug use: No    Types: Cocaine, Marijuana    Comment: no recent use  . Sexual activity: Not on file  Other Topics Concern  . Not on file  Social History Narrative   Patient is single with one child.   Patient is right handed.   Patient has his GED.   Patient drinks 5 or more cups daily.   Social Determinants of Health   Financial Resource Strain:   . Difficulty of Paying Living Expenses:   Food Insecurity:   . Worried About Charity fundraiser in the Last Year:   . Arboriculturist in the Last Year:   Transportation Needs:   . Film/video editor (Medical):   Marland Kitchen Lack of Transportation (Non-Medical):   Physical Activity:   . Days of Exercise per Week:   . Minutes of Exercise per Session:   Stress:   . Feeling of Stress :   Social Connections:   . Frequency of Communication with Friends and Family:   . Frequency of Social Gatherings with Friends and Family:   . Attends Religious Services:   . Active Member of Clubs or Organizations:   . Attends Archivist Meetings:   Marland Kitchen Marital Status:   Intimate Partner Violence:   . Fear of Current or Ex-Partner:   . Emotionally Abused:   Marland Kitchen Physically Abused:     . Sexually Abused:     Family History  Problem Relation Age of Onset  . Heart disease Father   . Heart attack Father   . Diabetes Paternal Grandmother        1 st degree relatives  . Hypertension Paternal Grandmother   . Diabetes Brother   . Heart disease Brother   . Muscular dystrophy Brother   . Muscular dystrophy Sister   . Prostate cancer Neg Hx   . Kidney cancer Neg Hx   . Bladder Cancer Neg Hx     Current Outpatient Medications  Medication Sig Dispense Refill  . albuterol (PROVENTIL) (2.5 MG/3ML) 0.083% nebulizer solution Take 3 mLs (2.5 mg total) by nebulization every 6 (six) hours as needed for wheezing or shortness of  breath. 75 mL 12  . amLODipine (NORVASC) 5 MG tablet Take by mouth.    Marland Kitchen aspirin EC 81 MG tablet Take 81 mg by mouth daily.    Marland Kitchen atorvastatin (LIPITOR) 80 MG tablet Take 80 mg by mouth daily.    . cyclobenzaprine (FLEXERIL) 10 MG tablet     . docusate sodium (COLACE) 100 MG capsule Take 1 capsule (100 mg total) by mouth 2 (two) times daily. 10 capsule 0  . EPINEPHrine 0.3 mg/0.3 mL IJ SOAJ injection Inject into the muscle.    Marland Kitchen FLUoxetine (PROZAC) 10 MG capsule Take 1 capsule (10 mg total) by mouth daily. 90 capsule 3  . Fluticasone-Salmeterol (ADVAIR DISKUS) 500-50 MCG/DOSE AEPB Inhale 1 puff into the lungs 2 (two) times daily. 60 each 11  . isosorbide mononitrate (IMDUR) 30 MG 24 hr tablet Take by mouth.    Marland Kitchen ketorolac (TORADOL) 10 MG tablet Take 1 tablet (10 mg total) by mouth every 6 (six) hours as needed. 20 tablet 0  . linaclotide (LINZESS) 290 MCG CAPS capsule Take by mouth.    Marland Kitchen lisinopril-hydrochlorothiazide (PRINZIDE,ZESTORETIC) 20-12.5 MG per tablet TAKE 1 TABLET BY MOUTH DAILY. 30 tablet 6  . meloxicam (MOBIC) 15 MG tablet TAKE 1 TABLET BY MOUTH EVERY DAY 30 tablet 3  . metoprolol (LOPRESSOR) 100 MG tablet Take by mouth.    . Multiple Vitamin (MULTI-VITAMINS) TABS Take by mouth.    . mupirocin ointment (BACTROBAN) 2 % Apply to inside of each  nostril twice daily for 7 days    . naloxone (NARCAN) nasal spray 4 mg/0.1 mL INHALE ONE SPRAY EACH NOSTRIL FOR OPIOID EMERGENCY    . ondansetron (ZOFRAN) 4 MG tablet Take 1 tablet (4 mg total) by mouth every 8 (eight) hours as needed for nausea or vomiting. 10 tablet 0  . oxyCODONE (ROXICODONE) 15 MG immediate release tablet Take by mouth.    . pantoprazole (PROTONIX) 40 MG tablet TAKE 1 TABLET (40 MG TOTAL) BY MOUTH DAILY. 30 tablet 6  . polyethylene glycol (MIRALAX / GLYCOLAX) packet Take 17 g by mouth daily as needed (for constipation).    . pregabalin (LYRICA) 150 MG capsule Take by mouth.    Marland Kitchen PROAIR HFA 108 (90 BASE) MCG/ACT inhaler INHALE 2 PUFFS INTO THE LUNGS EVERY 6 (SIX) HOURS AS NEEDED FOR WHEEZING. 22.5 Inhaler 1  . promethazine (PHENERGAN) 12.5 MG tablet Take 2 tablets (25 mg total) by mouth every 6 (six) hours as needed for nausea or vomiting. 10 tablet 0  . rOPINIRole (REQUIP) 1 MG tablet Take 1 mg by mouth at bedtime.    . tamsulosin (FLOMAX) 0.4 MG CAPS capsule Take 1 capsule (0.4 mg total) by mouth daily. 90 capsule 3  . tiotropium (SPIRIVA HANDIHALER) 18 MCG inhalation capsule Place 1 capsule (18 mcg total) into inhaler and inhale daily. 30 capsule 11   No current facility-administered medications for this visit.    Allergies  Allergen Reactions  . Bee Venom Swelling     REVIEW OF SYSTEMS:  [X]  denotes positive finding, [ ]  denotes negative finding Cardiac  Comments:  Chest pain or chest pressure:    Shortness of breath upon exertion:    Short of breath when lying flat:    Irregular heart rhythm:        Vascular    Pain in calf, thigh, or hip brought on by ambulation:    Pain in feet at night that wakes you up from your sleep:     Blood  clot in your veins:    Leg swelling:         Pulmonary    Oxygen at home:    Productive cough:     Wheezing:         Neurologic    Sudden weakness in arms or legs:     Sudden numbness in arms or legs:     Sudden onset  of difficulty speaking or slurred speech:    Temporary loss of vision in one eye:     Problems with dizziness:         Gastrointestinal    Blood in stool:     Vomited blood:         Genitourinary    Burning when urinating:     Blood in urine:        Psychiatric    Major depression:         Hematologic    Bleeding problems:    Problems with blood clotting too easily:        Skin    Rashes or ulcers:        Constitutional    Fever or chills:      PHYSICAL EXAMINATION:  Vitals:   06/09/20 1516 06/09/20 1521  BP: 115/68 108/64  Pulse: 63   Resp: 20   Temp: 97.7 F (36.5 C)   TempSrc: Temporal   SpO2: 90%   Weight: 211 lb 4.8 oz (95.8 kg)   Height: 5\' 11"  (1.803 m)     General:  WDWN in NAD; vital signs documented above Gait: Normal HENT: WNL, normocephalic Pulmonary: normal non-labored breathing , without Rales, rhonchi,  wheezing Cardiac: regular HR, without  Murmurs without carotid bruit Abdomen: soft, NT, no masses Vascular Exam/Pulses:2+ radial pulses, 2+ femoral pulses, 2+ popliteal pulses, 2+ PT pulses, 2+ AT pulses. Bilateral feet warm and well perfused Extremities: without ischemic changes, without Gangrene , without cellulitis; without open wounds;  Musculoskeletal: no muscle wasting or atrophy  Neurologic: A&O X 3;  No focal weakness or paresthesias are detected Psychiatric:  The pt has Normal affect.   Non-Invasive Vascular Imaging:   Summary:  Right Carotid: Patent right endarterectomy site, based on limited visualization. Velocities in the right ICA are consistent with a 1-39% stenosis.   Left Carotid: Velocities in the left ICA are consistent with a 1-39% stenosis.   Vertebrals: Bilateral vertebral arteries demonstrate antegrade flow.  Subclavians: Normal flow hemodynamics were seen in bilateral subclavian arteries.   VAS lower extremity arterial: concerns for prominent popliteal pulses at time of last visit Normal examination. No evidence of  popliteal aneurysm bilaterally.   ASSESSMENT/PLAN:: 64 y.o. male here for follow up for surveillance follow up of carotid stenosis.  s/p sternotomy for exposure of innominate artery, common carotid, and subclavian artery with endarterectomies and patch angioplasty 10/15/14 by Dr. Oneida Alar and Dr. Servando Snare. He remains asymptomatic. He does not have any lower extremity claudication symptoms, rest pain or non healing wounds.  - He remains on Aspirin and statin - encouraged continued smoking cessation  - discussed signs and symptoms of stroke/ TIA and advised him should these occur to seek immediate medical attention - he will follow up sooner should he experience new or worsening symptoms - He will follow up in 1 year with carotid duplex and ABIs   Karoline Caldwell, PA-C [facility] [phone]  On call MD:  Carlis Abbott

## 2020-07-09 ENCOUNTER — Telehealth: Payer: Self-pay | Admitting: Pulmonary Disease

## 2020-07-09 DIAGNOSIS — J449 Chronic obstructive pulmonary disease, unspecified: Secondary | ICD-10-CM

## 2020-07-09 NOTE — Telephone Encounter (Signed)
Called and spoke to patient, who stated that he is due to oxygen re certification. He only wears oxygen at night.   Dr. Patsey Berthold, please advise. thanks

## 2020-07-09 NOTE — Telephone Encounter (Signed)
Will need to repeat: ONOX on room air.  Then will need provider visit.

## 2020-07-10 NOTE — Telephone Encounter (Signed)
ONO has been ordered.  Patient will call back to schedule OV once ONO has been completed, as OV and ONO will all need to be within 30 days.  Nothing further is needed at this time.

## 2020-07-30 ENCOUNTER — Other Ambulatory Visit: Payer: Self-pay

## 2020-07-30 ENCOUNTER — Ambulatory Visit (INDEPENDENT_AMBULATORY_CARE_PROVIDER_SITE_OTHER): Payer: Medicaid Other | Admitting: Physician Assistant

## 2020-07-30 VITALS — BP 104/64 | HR 75

## 2020-07-30 DIAGNOSIS — Z87448 Personal history of other diseases of urinary system: Secondary | ICD-10-CM | POA: Diagnosis not present

## 2020-07-30 DIAGNOSIS — N451 Epididymitis: Secondary | ICD-10-CM

## 2020-07-30 LAB — BLADDER SCAN AMB NON-IMAGING: Scan Result: 30

## 2020-07-30 MED ORDER — LEVOFLOXACIN 500 MG PO TABS: 500.0000 mg | ORAL_TABLET | Freq: Every day | ORAL | 0 refills | Status: DC

## 2020-07-30 NOTE — Progress Notes (Signed)
07/30/2020 4:15 PM   Bradley Sawyer 11/02/1956 [id number]  CC: Chief Complaint  Patient presents with  . Benign Prostatic Hypertrophy    HPI: Bradley Sawyer is a 64 y.o. male with PMH hematuria with negative workup in 2020, epididymoorchitis, and BPH with LUTS on Flomax who presents today for evaluation of scrotal pain and left testicular swelling consistent with prior episodes of epididymoorchitis.  Today he reports a 6-day history of bilateral intermittent scrotal pain with swelling along the posterior aspect of the left testicle. He feels is straining to urinate.  He denies dysuria and penile discharge. No new sexual contacts, no known STI exposures.  In-office UA today positive for trace-intact blood; urine microscopy with 6-10 WBCs/HPF and moderate bacteria. PVR 110m.  PMH: Past Medical History:  Diagnosis Date  . Anxiety   . Arthritis   . 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 Cocaine dependence    Last use 1998, attends 3 NA meetings weekly.  . Compression fracture    L2  . COPD (chronic obstructive pulmonary disease) (HCC)    bronchitis  . CVA (cerebral infarction)   . Disorder of vocal cord   . Fatty liver   . GERD (gastroesophageal reflux disease)   . Hx of cardiovascular stress test    ETT/Lexiscan-Myoview (10/15):  Inferior, inferoapical, apical lateral defect c/w mild ischemia, EF 66%; Intermediate Risk  . Hyperlipidemia   . Hypertension   . Right shoulder pain    2/2 partial rotator cuff tear, tendinopathy, mild subacromial/subdeltioid bursitis, A/C joint arthropathy per MRI done in 3/07  . Tobacco abuse     Surgical History: Past Surgical History:  Procedure Laterality Date  . ANTERIOR LAT LUMBAR FUSION N/A 12/18/2013   Procedure: Lumbar Two Anteriorlateral Corpectomy w/ cage, interbody fusion, anterior plating, Lumbar One to Lumbar Three percutaneous pedicle screws;  Surgeon: HCharlie Pitter MD;  [place] [facility];   Service: Neurosurgery;  Laterality: N/A;  . AORTA -INNOMIATE BYPASS N/A 10/15/2014   Procedure: ENDARTERCTOMY OF  INNOMINATE ARTERY ;  Surgeon: CElam Dutch MD;  [place]  Service: Vascular;  Laterality: N/A;  . ARCH AORTOGRAM N/A 08/23/2014   Procedure: ARCH AORTOGRAM;  Surgeon: CElam Dutch MD;  [facility];  Service: Cardiovascular;  Laterality: N/A;  . arch aortogram, left carotid,left subclavian angiogram  08-23-2014  . CAROTID ANGIOGRAM Left 08/23/2014   Procedure: CAROTID ANGIOGRAM;  Surgeon: CElam Dutch MD;  [facility];  Service: Cardiovascular;  Laterality: Left;  . CHOLECYSTECTOMY  12/2016  . ENDARTERECTOMY Right 10/15/2014   Procedure: ENDARTERECTOMY RIGHT SUBCLAVIAN ARTERY;  Surgeon: CElam Dutch MD;  [place]  Service: Vascular;  Laterality: Right;  . ENDARTERECTOMY Right 10/15/2014   Procedure: ENDARTERECTOMY RIGHT COMMON CAROTID ARTERY;  Surgeon: CElam Dutch MD;  [place]  Service: Vascular;  Laterality: Right;  . 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 LEFT HEART CATHETERIZATION WITH CORONARY ANGIOGRAM N/A 09/12/2014   Procedure: LEFT HEART CATHETERIZATION WITH CORONARY ANGIOGRAM;  Surgeon: PJosue Hector MD;  [facility];  Service: Cardiovascular;  Laterality: N/A;  . LUMBAR SPINE SURGERY  12/18/2013   L2     DR POOL  . NECK SURGERY    . PATCH ANGIOPLASTY Right 10/15/2014   Procedure: PATCH ANGIOPLASTY OF RIGHT SUBCLAVIAN ARTERY , RIGHT COMMON CAROTID ARTERY & INNOMINATE ARTERY;  Surgeon: CElam Dutch MD;  [place]  Service: Vascular;  Laterality: Right;  . STERNOTOMY N/A 10/15/2014   Procedure: PARTIAL STERNOTOMY & PLATING OF STERNUM;  Surgeon: Elam Dutch, MD;  Location: [place];  Service: Vascular;  Laterality: N/A;  . TEE WITHOUT CARDIOVERSION N/A 12/25/2013   Procedure: TRANSESOPHAGEAL ECHOCARDIOGRAM (TEE);  Surgeon: Larey Dresser, MD;  Location: [place];   Service: Cardiovascular;  Laterality: N/A;    Home Medications:  Allergies as of 07/30/2020      Reactions   Bee Venom Swelling      Medication List       Accurate as of July 30, 2020  4:15 PM. If you have any questions, ask your nurse or doctor.        STOP taking these medications   promethazine 12.5 MG tablet Commonly known as: PHENERGAN Stopped by: Debroah Loop, PA-C     TAKE these medications   amLODipine 5 MG tablet Commonly known as: NORVASC Take by mouth.   aspirin EC 81 MG tablet Take 81 mg by mouth daily.   atorvastatin 80 MG tablet Commonly known as: LIPITOR Take 80 mg by mouth daily.   cyclobenzaprine 10 MG tablet Commonly known as: FLEXERIL   docusate sodium 100 MG capsule Commonly known as: COLACE Take 1 capsule (100 mg total) by mouth 2 (two) times daily.   EPINEPHrine 0.3 mg/0.3 mL Soaj injection Commonly known as: EPI-PEN Inject into the muscle.   FLUoxetine 10 MG capsule Commonly known as: PROZAC Take 1 capsule (10 mg total) by mouth daily.   Fluticasone-Salmeterol 500-50 MCG/DOSE Aepb Commonly known as: Advair Diskus Inhale 1 puff into the lungs 2 (two) times daily.   isosorbide mononitrate 30 MG 24 hr tablet Commonly known as: IMDUR Take by mouth.   ketorolac 10 MG tablet Commonly known as: TORADOL Take 1 tablet (10 mg total) by mouth every 6 (six) hours as needed.   levocetirizine 5 MG tablet Commonly known as: XYZAL Take 5 mg by mouth at bedtime.   levofloxacin 500 MG tablet Commonly known as: LEVAQUIN Take 1 tablet (500 mg total) by mouth daily. Started by: Debroah Loop, PA-C   linaclotide 290 MCG Caps capsule Commonly known as: LINZESS Take by mouth.   lisinopril-hydrochlorothiazide 20-12.5 MG tablet Commonly known as: ZESTORETIC TAKE 1 TABLET BY MOUTH DAILY.   meloxicam 15 MG tablet Commonly known as: MOBIC TAKE 1 TABLET BY MOUTH EVERY DAY   metFORMIN 500 MG tablet Commonly known as:  GLUCOPHAGE Take 500 mg by mouth daily.   metoprolol tartrate 100 MG tablet Commonly known as: LOPRESSOR Take by mouth.   montelukast 10 MG tablet Commonly known as: SINGULAIR Take 10 mg by mouth daily.   Multi-Vitamins Tabs Take by mouth.   mupirocin ointment 2 % Commonly known as: BACTROBAN Apply to inside of each nostril twice daily for 7 days   Narcan 4 MG/0.1ML Liqd nasal spray kit Generic drug: naloxone INHALE ONE SPRAY EACH NOSTRIL FOR OPIOID EMERGENCY   ondansetron 4 MG tablet Commonly known as: Zofran Take 1 tablet (4 mg total) by mouth every 8 (eight) hours as needed for nausea or vomiting.   oxyCODONE 15 MG immediate release tablet Commonly known as: ROXICODONE Take by mouth.   pantoprazole 40 MG tablet Commonly known as: PROTONIX TAKE 1 TABLET (40 MG TOTAL) BY MOUTH DAILY.   polyethylene glycol 17 g packet Commonly known as: MIRALAX / GLYCOLAX Take 17 g by mouth daily as needed (for constipation).   pregabalin 150 MG capsule Commonly known as: LYRICA Take by mouth.  ProAir HFA 108 (90 Base) MCG/ACT inhaler Generic drug: albuterol INHALE 2 PUFFS INTO THE LUNGS EVERY 6 (SIX) HOURS AS NEEDED FOR WHEEZING.   albuterol (2.5 MG/3ML) 0.083% nebulizer solution Commonly known as: PROVENTIL Take 3 mLs (2.5 mg total) by nebulization every 6 (six) hours as needed for wheezing or shortness of breath.   rOPINIRole 1 MG tablet Commonly known as: REQUIP Take 1 mg by mouth at bedtime.   Spiriva HandiHaler 18 MCG inhalation capsule Generic drug: tiotropium Place 1 capsule (18 mcg total) into inhaler and inhale daily.   tamsulosin 0.4 MG Caps capsule Commonly known as: FLOMAX Take 1 capsule (0.4 mg total) by mouth daily.       Allergies:  Allergies  Allergen Reactions  . Bee Venom Swelling    Family History: Family History  Problem Relation Age of Onset  . Heart disease Father   . Heart attack Father   . Diabetes Paternal Grandmother        1 st  degree relatives  . Hypertension Paternal Grandmother   . Diabetes Brother   . Heart disease Brother   . Muscular dystrophy Brother   . Muscular dystrophy Sister   . Prostate cancer Neg Hx   . Kidney cancer Neg Hx   . Bladder Cancer Neg Hx     Social History:   reports that he has been smoking cigarettes. He has a 96.00 pack-year smoking history. He has never used smokeless tobacco. He reports that he does not drink alcohol and does not use drugs.  Physical Exam: BP 104/64   Pulse 75   Constitutional:  Alert and oriented, no acute distress, nontoxic appearing HEENT: [place], AT Cardiovascular: No clubbing, cyanosis, or edema Respiratory: Normal respiratory effort, no increased work of breathing GU: Bilateral descended testicles. Bilateral fullness of the spermatic cords. Palpable, tender bilateral epididymides, L>R. Lymph: No inguinal lymphadenopathy Skin: No rashes, bruises or suspicious lesions Neurologic: Grossly intact, no focal deficits, moving all 4 extremities Psychiatric: Normal mood and affect  Laboratory Data: Results for orders placed or performed in visit on 07/30/20  CULTURE, URINE COMPREHENSIVE   Specimen: Urine   UR  Result Value Ref Range   Urine Culture, Comprehensive Preliminary report    Organism ID, Bacteria Comment   Microscopic Examination   Urine  Result Value Ref Range   WBC, UA 6-10 (A) 0 - 5 /hpf   RBC 0-2 0 - 2 /hpf   Epithelial Cells (non renal) 0-10 0 - 10 /hpf   Casts Present (A) None seen /lpf   Cast Type Hyaline casts N/A   Bacteria, UA Moderate (A) None seen/Few  Urinalysis, Complete  Result Value Ref Range   Specific Gravity, UA >1.030 (H) 1.005 - 1.030   pH, UA 5.0 5.0 - 7.5   Color, UA Orange Yellow   Appearance Ur Hazy (A) Clear   Leukocytes,UA Negative Negative   Protein,UA Negative Negative/Trace   Glucose, UA Negative Negative   Ketones, UA Negative Negative   RBC, UA Trace (A) Negative   Bilirubin, UA Negative Negative    Urobilinogen, Ur 0.2 0.2 - 1.0 mg/dL   Nitrite, UA Negative Negative   Microscopic Examination See below:   BLADDER SCAN AMB NON-IMAGING  Result Value Ref Range   Scan Result 30 ml    Assessment & Plan:   1. Epididymitis Noted on physical exam and symptoms consistent with prior episodes. Will send urine for culture and start him on empiric levofloxacin. Encouraged scrotal support. - Urinalysis,  Complete - BLADDER SCAN AMB NON-IMAGING - CULTURE, URINE COMPREHENSIVE - levofloxacin (LEVAQUIN) 500 MG tablet; Take 1 tablet (500 mg total) by mouth daily.  Dispense: 10 tablet; Refill: 0  Return if symptoms worsen or fail to improve.  Debroah Loop, PA-C  [facility] [address], [place], [place] [address] [phone]

## 2020-07-30 NOTE — Patient Instructions (Signed)
Epididymitis  Epididymitis is swelling (inflammation) or infection of the epididymis. The epididymis is a cord-like structure that is located along the top and back part of the testicle. It collects and stores sperm from the testicle. This condition can also cause pain and swelling of the testicle and scrotum. Symptoms usually start suddenly (acute epididymitis). Sometimes epididymitis starts gradually and lasts for a while (chronic epididymitis). This type may be harder to treat. What are the causes? In men ages 20-40, this condition is usually caused by a bacterial infection or a sexually transmitted disease (STD), such as:  Gonorrhea.  Chlamydia. In men 40 and older who do not have anal sex, this condition is usually caused by bacteria from a blockage or from abnormalities in the urinary system. These can result from:  Having a tube placed into the bladder (urinary catheter).  Having an enlarged or inflamed prostate gland.  Having recently had urinary tract surgery.  Having a problem with a backward flow of urine (retrograde). In men who have a condition that weakens the body's defense system (immune system), such as HIV, this condition can be caused by:  Other bacteria, including tuberculosis and syphilis.  Viruses.  Fungi. Sometimes this condition occurs without infection. This may happen because of trauma or repetitive activities such as sports. What increases the risk? You are more likely to develop this condition if you have:  Unprotected sex with more than one partner.  Anal sex.  Recently had surgery.  A urinary catheter.  Urinary problems.  A suppressed immune system. What are the signs or symptoms? This condition usually begins suddenly with chills, fever, and pain behind the scrotum and in the testicle. Other symptoms include:  Swelling of the scrotum, testicle, or both.  Pain when ejaculating or urinating.  Pain in the back or  abdomen.  Nausea.  Itching and discharge from the penis.  A frequent need to pass urine.  Redness, increased warmth, and tenderness of the scrotum. How is this diagnosed? Your health care provider can diagnose this condition based on your symptoms and medical history. Your health care provider will also do a physical exam to ask about your symptoms and check your scrotum and testicle for swelling, pain, and redness. You may also have other tests, including:  Examination of discharge from the penis.  Urine tests for infections, such as STDs.  Ultrasound test for blood flow and inflammation. Your health care provider may test you for other STDs, including HIV. How is this treated? Treatment for this condition depends on the cause. If your condition is caused by a bacterial infection, oral antibiotic medicine may be prescribed. If the bacterial infection has spread to your blood, you may need to receive IV antibiotics. For both bacterial and nonbacterial epididymitis, you may be treated with:  Rest.  Elevation of the scrotum.  Pain medicines.  Anti-inflammatory medicines. Surgery may be needed to treat:  Bacterial epididymitis that causes pus to build up in the scrotum (abscess).  Chronic epididymitis that has not responded to other treatments. Follow these instructions at home: Medicines  Take over-the-counter and prescription medicines only as told by your health care provider.  If you were prescribed an antibiotic medicine, take it as told by your health care provider. Do not stop taking the antibiotic even if your condition improves. Sexual activity  If your epididymitis was caused by an STD, avoid sexual activity until your treatment is complete.  Inform your sexual partner or partners if you test positive for   an STD. They may need to be treated. Do not engage in sexual activity with your partner or partners until their treatment is completed. Managing pain and  swelling   If directed, elevate your scrotum and apply ice. ? Put ice in a plastic bag. ? Place a small towel or pillow between your legs. ? Rest your scrotum on the pillow or towel. ? Place another towel between your skin and the plastic bag. ? Leave the ice on for 20 minutes, 2-3 times a day.  Try taking a sitz bath to help with discomfort. This is a warm water bath that is taken while you are sitting down. The water should only come up to your hips and should cover your buttocks. Do this 3-4 times per day or as told by your health care provider.  Keep your scrotum elevated and supported while resting. Ask your health care provider if you should wear a scrotal support, such as a jockstrap. Wear it as told by your health care provider. General instructions  Return to your normal activities as told by your health care provider. Ask your health care provider what activities are safe for you.  Drink enough fluid to keep your urine pale yellow.  Keep all follow-up visits as told by your health care provider. This is important. Contact a health care provider if:  You have a fever.  Your pain medicine is not helping.  Your pain is getting worse.  Your symptoms do not improve within 3 days. Summary  Epididymitis is swelling (inflammation) or infection of the epididymis. This condition can also cause pain and swelling of the testicle and scrotum.  Treatment for this condition depends on the cause. If your condition is caused by a bacterial infection, oral antibiotic medicine may be prescribed.  Inform your sexual partner or partners if you test positive for an STD. They may need to be treated. Do not engage in sexual activity with your partner or partners until their treatment is completed.  Contact a health care provider if your symptoms do not improve within 3 days. This information is not intended to replace advice given to you by your health care provider. Make sure you discuss any  questions you have with your health care provider. Document Revised: 09/11/2018 Document Reviewed: 09/12/2018 Elsevier Patient Education  2020 [employer]  

## 2020-07-31 LAB — URINALYSIS, COMPLETE
Bilirubin, UA: NEGATIVE
Glucose, UA: NEGATIVE
Ketones, UA: NEGATIVE
Leukocytes,UA: NEGATIVE
Nitrite, UA: NEGATIVE
Protein,UA: NEGATIVE
Specific Gravity, UA: >1.03 — ABNORMAL HIGH (ref 1.005–1.030)
Urobilinogen, Ur: 0.2 mg/dL (ref 0.2–1.0)
pH, UA: 5 (ref 5.0–7.5)

## 2020-07-31 LAB — MICROSCOPIC EXAMINATION

## 2020-08-03 LAB — CULTURE, URINE COMPREHENSIVE

## 2020-08-22 ENCOUNTER — Other Ambulatory Visit: Payer: Self-pay

## 2020-08-22 ENCOUNTER — Encounter: Payer: Self-pay | Admitting: Pulmonary Disease

## 2020-08-22 ENCOUNTER — Ambulatory Visit (INDEPENDENT_AMBULATORY_CARE_PROVIDER_SITE_OTHER): Payer: Self-pay | Admitting: Pulmonary Disease

## 2020-08-22 VITALS — BP 132/66 | HR 64 | Temp 97.3°F | Ht 71.0 in | Wt 219.8 lb

## 2020-08-22 DIAGNOSIS — G4736 Sleep related hypoventilation in conditions classified elsewhere: Secondary | ICD-10-CM

## 2020-08-22 DIAGNOSIS — J449 Chronic obstructive pulmonary disease, unspecified: Secondary | ICD-10-CM

## 2020-08-22 DIAGNOSIS — Z87891 Personal history of nicotine dependence: Secondary | ICD-10-CM

## 2020-08-22 DIAGNOSIS — J439 Emphysema, unspecified: Secondary | ICD-10-CM

## 2020-08-22 NOTE — Patient Instructions (Signed)
We will send a request that [employer] checks your oxygen level at nighttime.  The night that you have the oxygen level checked please make sure you to wear your oxygen so we can get a reading.   Continue your Advair and your Spiriva as well as the ProAir.  We will be scheduling you for breathing tests.   We will see you in follow-up in 3 months time call sooner should any new problems arise.

## 2020-08-22 NOTE — Progress Notes (Signed)
Subjective:    Patient ID: Bradley Sawyer, male    DOB: 1956-05-04, 64 y.o.   MRN: [id number]  HPI This is a 64 year old former smoker (quit 7 to 8 months ago) with severe COPD who presents for follow-up of the same.  He is a prior patient of Dr. Mathis Fare whom we first evaluated in November 2020.  The patient presents for routine follow-up and for requalification of nocturnal oxygen use.  He is currently maintained on Advair 500/50, 1 inhalation twice a day and Spiriva HandiHaler 1 capsule inhaled daily.  He does not report any overt new issues since his last visit.  Last pulmonary function testing was performed in November 2020: Showed FEV1 of 1.64 L or 45% predicted with an FEV1/FVC of 53% lung volumes showed hyperinflation and air trapping, diffusion capacity was moderately reduced.  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  Patient's cough quality has not changed.  Sputum production unchanged.  No hemoptysis.  He is enrolled in lung cancer screening.  No concerns voiced today.  He is compliant with his nocturnal oxygen at 2 L/min.  Review of Systems A 10 point review of systems was performed and it is as noted above otherwise negative.  Allergies  Allergen Reactions  . Bee Venom Swelling   Current Meds  Medication Sig  . albuterol (PROVENTIL) (2.5 MG/3ML) 0.083% nebulizer solution Take 3 mLs (2.5 mg total) by nebulization every 6 (six) hours as needed for wheezing or shortness of breath.  Marland Kitchen amLODipine (NORVASC) 5 MG tablet Take by mouth.  Marland Kitchen aspirin EC 81 MG tablet Take 81 mg by mouth daily.  Marland Kitchen atorvastatin (LIPITOR) 80 MG tablet Take 80 mg by mouth daily.  . cyclobenzaprine (FLEXERIL) 10 MG tablet   . docusate sodium (COLACE) 100 MG capsule Take 1 capsule (100 mg total) by mouth 2 (two) times daily.  Marland Kitchen FLUoxetine (PROZAC) 10 MG capsule Take 1 capsule (10 mg total) by mouth daily.  . Fluticasone-Salmeterol (ADVAIR DISKUS) 500-50  MCG/DOSE AEPB Inhale 1 puff into the lungs 2 (two) times daily.  Marland Kitchen levocetirizine (XYZAL) 5 MG tablet Take 5 mg by mouth at bedtime.  Marland Kitchen levofloxacin (LEVAQUIN) 500 MG tablet Take 1 tablet (500 mg total) by mouth daily.  Marland Kitchen linaclotide (LINZESS) 290 MCG CAPS capsule Take by mouth.  Marland Kitchen lisinopril-hydrochlorothiazide (PRINZIDE,ZESTORETIC) 20-12.5 MG per tablet TAKE 1 TABLET BY MOUTH DAILY.  . meloxicam (MOBIC) 15 MG tablet TAKE 1 TABLET BY MOUTH EVERY DAY  . metFORMIN (GLUCOPHAGE) 500 MG tablet Take 500 mg by mouth daily.  . metoprolol (LOPRESSOR) 100 MG tablet Take by mouth.  . montelukast (SINGULAIR) 10 MG tablet Take 10 mg by mouth daily.  . Multiple Vitamin (MULTI-VITAMINS) TABS Take by mouth.  . mupirocin ointment (BACTROBAN) 2 % Apply to inside of each nostril twice daily for 7 days  . ondansetron (ZOFRAN) 4 MG tablet Take 1 tablet (4 mg total) by mouth every 8 (eight) hours as needed for nausea or vomiting.  Marland Kitchen oxyCODONE (ROXICODONE) 15 MG immediate release tablet Take by mouth.  . pantoprazole (PROTONIX) 40 MG tablet TAKE 1 TABLET (40 MG TOTAL) BY MOUTH DAILY.  Marland Kitchen polyethylene glycol (MIRALAX / GLYCOLAX) packet Take 17 g by mouth daily as needed (for constipation).  . pregabalin (LYRICA) 150 MG capsule Take by mouth.  Marland Kitchen PROAIR HFA 108 (90 BASE) MCG/ACT inhaler INHALE 2 PUFFS INTO THE LUNGS EVERY 6 (SIX) HOURS AS NEEDED FOR WHEEZING.  . rOPINIRole (  REQUIP) 1 MG tablet Take 1 mg by mouth at bedtime.  . tamsulosin (FLOMAX) 0.4 MG CAPS capsule Take 1 capsule (0.4 mg total) by mouth daily.  Marland Kitchen tiotropium (SPIRIVA HANDIHALER) 18 MCG inhalation capsule Place 1 capsule (18 mcg total) into inhaler and inhale daily.   Immunization History  Administered Date(s) Administered  . Influenza, Seasonal, Injecte, Preservative Fre 08/01/2013, 08/12/2014, 08/26/2015, 08/06/2016, 09/26/2019  . Influenza,inj,Quad PF,6+ Mos 08/01/2013, 08/12/2014  . Influenza-Unspecified 10/30/2018  . Moderna SARS-COVID-2  Vaccination 02/06/2020, 03/05/2020  . Pneumococcal Polysaccharide-23 10/20/2014  . Pneumococcal-Unspecified 10/20/2014   Social History   Tobacco Use  . Smoking status: Former Smoker    Packs/day: 2.00    Years: 48.00    Pack years: 96.00    Types: Cigarettes  . Smokeless tobacco: Never Used  . Tobacco comment: 4-5 cigarettes daily- 10/16/2019  Substance Use Topics  . Alcohol use: No    Alcohol/week: 0.0 standard drinks       Objective:   Physical Exam BP 132/66 (BP Location: Left Arm, Patient Position: Sitting, Cuff Size: Normal)   Pulse 64   Temp (!) 97.3 F (36.3 C) (Temporal)   Ht 5\' 11"  (1.803 m)   Wt 219 lb 12.8 oz (99.7 kg)   SpO2 94%   BMI 30.66 kg/m  GENERAL: Well-developed, overweight gentleman, no acute distress.   HEAD: Normocephalic, atraumatic.  EYES: Pupils equal, round, reactive to light.  No scleral icterus.  MOUTH: Nose/mouth/throat not examined due to masking requirements for COVID 19. NECK: Supple. No thyromegaly. Trachea midline. No JVD.  No adenopathy. PULMONARY: Good air entry bilaterally.  End expiratory wheezing throughout.  Rhonchi throughout.  CARDIOVASCULAR: S1 and S2. Regular rate and rhythm.  No rubs, murmurs or gallops heard. ABDOMEN: Benign. MUSCULOSKELETAL: No joint deformity, no clubbing, no edema.  NEUROLOGIC: No focal deficits, no gait disturbance.  Speech is fluent. SKIN: Intact,warm,dry. PSYCH: Mood and behavior normal.     Assessment & Plan:     ICD-10-CM   1. COPD with chronic bronchitis and emphysema ([place])  J44.9 Pulmonary Function Test [facility] Only    Pulse oximetry, overnight    AMB REFERRAL FOR DME   Reassess with PFTs Medication changes not possible due to patient's finances Continue Advair, Spiriva and as needed albuterol  2. Nocturnal hypoxemia due to emphysema (HCC)  J43.9 Pulse oximetry, overnight   G47.36    Continue O2 at 2 L/min Reassess with overnight oximetry on room air  3. Former heavy cigarette smoker  (20-39 per day)  Z87.891    Commended on continued abstinence of cigarettes Enrolled in lung cancer screening program   Orders Placed This Encounter  Procedures  . AMB REFERRAL FOR DME    Referral Priority:   Routine    Referral Type:   Durable Medical Equipment Purchase    Number of Visits Requested:   1  . Pulmonary Function Test [facility] Only    Standing Status:   Future    Standing Expiration Date:   08/22/2021    Order Specific Question:   Full PFT: includes the following: basic spirometry, spirometry pre & post bronchodilator, diffusion capacity (DLCO), lung volumes    Answer:   Full PFT    Order Specific Question:   This test can only be performed at    Answer:   [facility]  . Pulse oximetry, overnight    ONO room air.  DME: [employer]    Standing Status:   Future    Standing Expiration Date:  08/22/2021   Discussion:  Despite his lung examination the patient states that his need lung function is unchanged.  Optimization of his regimen is not possible due to cost.  He will need reassessment of oxygen requirements at nighttime.  Can follow-up in 3 months time he is to contact us prior to that time should any new difficulties arise.   Renold Don, MD [facility]   *This note was dictated using voice recognition software/Dragon.  Despite best efforts to proofread, errors can occur which can change the meaning.  Any change was purely unintentional.

## 2020-08-22 NOTE — Progress Notes (Signed)
 Assessment & Plan:  1. COPD with chronic bronchitis and emphysema ([facility]) ([place])  2. Tobacco dependence due to cigarettes   Patient Instructions  1.  We refilled your medications.  2.  Continue using oxygen at nighttime.   3.  We will see him in follow-up in 3 months time.  Call sooner should any new difficulties arise.  Please note: late entry documentation due to logistical difficulties during COVID-19 pandemic. This note is filed for information purposes only, and is not intended to be used for billing, nor does it represent the full scope/nature of the visit in question. Please see any associated scanned media linked to date of encounter for additional pertinent information.  Subjective:    HPI: Bradley Sawyer is a 64 y.o. male presenting to the pulmonology clinic on 10/16/2019 with report of: Follow-up (former DR pt- PFT 10/09/2019-pt reports of sob with exertion, mild prod cough with white mucus and wheezing.)     Outpatient Encounter Medications as of 10/16/2019  Medication Sig Note   aspirin  EC 81 MG tablet Take 81 mg by mouth daily.    Multiple Vitamin (MULTI-VITAMINS) TABS Take 1 tablet by mouth daily.    naloxone  (NARCAN ) nasal spray 4 mg/0.1 mL Place 0.4 mg into the nose once. 03/23/2017: Patient states has not had to use this   polyethylene glycol (MIRALAX  / GLYCOLAX ) packet Take 17 g by mouth daily as needed (for constipation).    [DISCONTINUED] ADVAIR DISKUS 500-50 MCG/DOSE AEPB INHALE 1 PUFF INTO THE LUNGS EVERY 12 (TWELVE) HOURS.    [DISCONTINUED] albuterol  (ACCUNEB ) 1.25 MG/3ML nebulizer solution Take 3 mLs by nebulization every 6 (six) hours as needed.    [DISCONTINUED] amLODipine  (NORVASC ) 5 MG tablet Take 5 mg by mouth daily.    [DISCONTINUED] atorvastatin  (LIPITOR ) 10 MG tablet Take 1 tablet (10 mg total) by mouth daily.    [DISCONTINUED] benazepril (LOTENSIN) 40 MG tablet Take by mouth.    [DISCONTINUED] docusate sodium  (COLACE) 100 MG capsule Take 1  capsule (100 mg total) by mouth 2 (two) times daily. (Patient not taking: Reported on 02/19/2021)    [DISCONTINUED] FLUoxetine  (PROZAC ) 10 MG capsule Take 1 capsule (10 mg total) by mouth daily.    [DISCONTINUED] ketorolac  (TORADOL ) 10 MG tablet Take 1 tablet (10 mg total) by mouth every 6 (six) hours as needed. (Patient not taking: Reported on 02/19/2021)    [DISCONTINUED] lisinopril -hydrochlorothiazide  (PRINZIDE ,ZESTORETIC ) 20-12.5 MG per tablet TAKE 1 TABLET BY MOUTH DAILY. (Patient taking differently: Take 1 tablet by mouth daily.)    [DISCONTINUED] meloxicam  (MOBIC ) 15 MG tablet TAKE 1 TABLET BY MOUTH EVERY DAY (Patient taking differently: Take 15 mg by mouth daily.)    [DISCONTINUED] metoprolol  (LOPRESSOR ) 100 MG tablet Take 100 mg by mouth 2 (two) times daily.    [DISCONTINUED] ondansetron  (ZOFRAN ) 4 MG tablet Take 1 tablet (4 mg total) by mouth every 8 (eight) hours as needed for nausea or vomiting. (Patient not taking: Reported on 02/19/2021)    [DISCONTINUED] oxyCODONE  (ROXICODONE ) 15 MG immediate release tablet Take 15 mg by mouth See admin instructions. Six time a day    [DISCONTINUED] pantoprazole  (PROTONIX ) 40 MG tablet TAKE 1 TABLET (40 MG TOTAL) BY MOUTH DAILY. (Patient taking differently: Take 40 mg by mouth daily.)    [DISCONTINUED] PROAIR  HFA 108 (90 BASE) MCG/ACT inhaler INHALE 2 PUFFS INTO THE LUNGS EVERY 6 (SIX) HOURS AS NEEDED FOR WHEEZING. (Patient taking differently: Inhale 2 puffs into the lungs every 6 (six) hours as needed for  shortness of breath or wheezing.)    [DISCONTINUED] promethazine  (PHENERGAN ) 12.5 MG tablet Take 2 tablets (25 mg total) by mouth every 6 (six) hours as needed for nausea or vomiting.    [DISCONTINUED] rOPINIRole  (REQUIP ) 1 MG tablet Take 1 mg by mouth at bedtime.    [DISCONTINUED] tamsulosin  (FLOMAX ) 0.4 MG CAPS capsule Take 1 capsule (0.4 mg total) by mouth daily.    [DISCONTINUED] tiotropium (SPIRIVA ) 18 MCG inhalation capsule Place 18 mcg into inhaler  and inhale daily.    [DISCONTINUED] albuterol  (PROVENTIL ) (2.5 MG/3ML) 0.083% nebulizer solution Take 3 mLs (2.5 mg total) by nebulization every 6 (six) hours as needed for wheezing or shortness of breath.    [DISCONTINUED] Fluticasone -Salmeterol (ADVAIR DISKUS) 500-50 MCG/DOSE AEPB Inhale 1 puff into the lungs 2 (two) times daily.    [DISCONTINUED] tiotropium (SPIRIVA  HANDIHALER) 18 MCG inhalation capsule Place 1 capsule (18 mcg total) into inhaler and inhale daily.    No facility-administered encounter medications on file as of 10/16/2019.      Objective:   Vitals:   10/16/19 1003  BP: (!) 108/58  Pulse: 72  Temp: (!) 97.4 F (36.3 C)  Height: 5' 11 (1.803 m)  Weight: 217 lb (98.4 kg)  SpO2: 94%  TempSrc: Temporal  BMI (Calculated): 30.28     Physical exam documentation is limited by delayed entry of information.

## 2020-08-27 ENCOUNTER — Telehealth: Payer: Self-pay | Admitting: Pulmonary Disease

## 2020-08-27 NOTE — Telephone Encounter (Signed)
Per pt's request, I eft message on pt's VM will [facility] PFT's # to call & reschedule.

## 2020-08-28 ENCOUNTER — Other Ambulatory Visit: Admission: RE | Admit: 2020-08-28 | Payer: Medicaid Other | Source: Ambulatory Visit

## 2020-08-29 ENCOUNTER — Ambulatory Visit: Payer: Medicaid Other

## 2020-09-10 ENCOUNTER — Other Ambulatory Visit: Payer: Self-pay | Admitting: Pulmonary Disease

## 2020-10-14 ENCOUNTER — Other Ambulatory Visit
Admission: RE | Admit: 2020-10-14 | Discharge: 2020-10-14 | Disposition: A | Discharge status: Home / Self Care | Payer: Medicaid Other | Source: Ambulatory Visit | Attending: Pulmonary Disease | Admitting: Pulmonary Disease

## 2020-10-14 ENCOUNTER — Other Ambulatory Visit: Payer: Self-pay

## 2020-10-14 DIAGNOSIS — Z01812 Encounter for preprocedural laboratory examination: Secondary | ICD-10-CM | POA: Diagnosis not present

## 2020-10-14 DIAGNOSIS — Z20822 Contact with and (suspected) exposure to covid-19: Secondary | ICD-10-CM | POA: Diagnosis not present

## 2020-10-14 LAB — SARS CORONAVIRUS 2 (TAT 6-24 HRS): SARS Coronavirus 2: NEGATIVE

## 2020-10-15 ENCOUNTER — Ambulatory Visit: Payer: Medicaid Other | Attending: Pulmonary Disease

## 2020-10-15 ENCOUNTER — Other Ambulatory Visit: Payer: Self-pay

## 2020-10-15 DIAGNOSIS — J449 Chronic obstructive pulmonary disease, unspecified: Secondary | ICD-10-CM

## 2020-10-19 LAB — PULMONARY FUNCTION TEST ARMC ONLY
DL/VA % pred: 66 %
DL/VA: 2.73 ml/min/mmHg/L
DLCO unc % pred: 54 %
DLCO unc: 15.22 ml/min/mmHg
FEF 25-75 Post: 0.76 L/sec
FEF 25-75 Pre: 0.82 L/sec
FEF2575-%Change-Post: -7 %
FEF2575-%Pred-Post: 26 %
FEF2575-%Pred-Pre: 28 %
FEV1-%Change-Post: -10 %
FEV1-%Pred-Post: 45 %
FEV1-%Pred-Pre: 50 %
FEV1-Post: 1.63 L
FEV1-Pre: 1.83 L
FEV1FVC-%Change-Post: -7 %
FEV1FVC-%Pred-Pre: 68 %
FEV6-%Change-Post: -2 %
FEV6-%Pred-Post: 74 %
FEV6-%Pred-Pre: 75 %
FEV6-Post: 3.39 L
FEV6-Pre: 3.46 L
FEV6FVC-%Change-Post: 1 %
FEV6FVC-%Pred-Post: 103 %
FEV6FVC-%Pred-Pre: 102 %
FVC-%Change-Post: -3 %
FVC-%Pred-Post: 71 %
FVC-%Pred-Pre: 74 %
FVC-Post: 3.44 L
FVC-Pre: 3.57 L
Post FEV1/FVC ratio: 47 %
Post FEV6/FVC ratio: 99 %
Pre FEV1/FVC ratio: 51 %
Pre FEV6/FVC Ratio: 97 %
RV % pred: 126 %
RV: 3.01 L
TLC % pred: 95 %
TLC: 6.88 L

## 2020-10-22 ENCOUNTER — Other Ambulatory Visit: Payer: Self-pay | Admitting: *Deleted

## 2020-10-22 DIAGNOSIS — Z87891 Personal history of nicotine dependence: Secondary | ICD-10-CM

## 2020-10-22 DIAGNOSIS — Z122 Encounter for screening for malignant neoplasm of respiratory organs: Secondary | ICD-10-CM

## 2020-10-22 NOTE — Progress Notes (Signed)
Contacted and scheduled for annual lung screening scan. Patient is a former smoker, quit 10/06/20 with a 96 pack year history.

## 2020-11-04 ENCOUNTER — Ambulatory Visit: Payer: Medicaid Other

## 2020-11-06 ENCOUNTER — Other Ambulatory Visit: Payer: Self-pay

## 2020-11-06 ENCOUNTER — Ambulatory Visit
Admission: RE | Admit: 2020-11-06 | Discharge: 2020-11-06 | Disposition: A | Discharge status: Home / Self Care | Payer: Medicaid Other | Source: Ambulatory Visit | Attending: Oncology | Admitting: Oncology

## 2020-11-06 DIAGNOSIS — Z122 Encounter for screening for malignant neoplasm of respiratory organs: Secondary | ICD-10-CM | POA: Diagnosis present

## 2020-11-06 DIAGNOSIS — Z87891 Personal history of nicotine dependence: Secondary | ICD-10-CM | POA: Insufficient documentation

## 2020-11-07 ENCOUNTER — Other Ambulatory Visit: Payer: Self-pay | Admitting: Pulmonary Disease

## 2020-11-07 ENCOUNTER — Other Ambulatory Visit: Payer: Self-pay | Admitting: Urology

## 2020-11-07 ENCOUNTER — Telehealth: Payer: Self-pay | Admitting: *Deleted

## 2020-11-07 DIAGNOSIS — N401 Enlarged prostate with lower urinary tract symptoms: Secondary | ICD-10-CM

## 2020-11-07 DIAGNOSIS — R351 Nocturia: Secondary | ICD-10-CM

## 2020-11-07 MED ORDER — CLARITHROMYCIN 500 MG PO TABS: 500.0000 mg | ORAL_TABLET | Freq: Two times a day (BID) | ORAL | 0 refills | Status: DC

## 2020-11-07 MED ORDER — CLARITHROMYCIN 500 MG PO TABS: 500.0000 mg | ORAL_TABLET | Freq: Two times a day (BID) | ORAL | 0 refills | Status: AC

## 2020-11-07 NOTE — Telephone Encounter (Signed)
CT scan will be ordered through the lung cancer screening as it is a follow-up.

## 2020-11-07 NOTE — Telephone Encounter (Addendum)
Patient is aware of below recommendations and voiced understanding.  He is aware to hold Lipitor while taking Biaxin. Okay to continue Advair with Biaxin with Dr. Patsey Berthold verbally.  Rx for Biaxin 500mg  has been sent to preferred pharmacy.   Dr. Patsey Berthold, is CT to be without contrast?

## 2020-11-07 NOTE — Telephone Encounter (Signed)
I have reviewed the CT the findings appear more consistent with perhaps MAI. Since he is having some issues with productive cough can Rx with Biaxin 500 mg twice daily x7 days. If patient is on a statin this may be held while he is on Biaxin. Follow-up on 22-month CT.

## 2020-11-07 NOTE — Telephone Encounter (Signed)
Notified patient of LDCT lung cancer screening program results with recommendation for 3 month follow up imaging. Also notified of incidental findings noted below and is encouraged to discuss further with PCP who will receive a copy of this note and/or the CT report. Patient verbalizes understanding. Reports productive cough. Patient knows that his pulmonologist may want to prescribe antibiotics or other medications. Results will be sent to Dr. Patsey Berthold and patient's PCP.  IMPRESSION: 1. Lung-RADS 4A, suspicious. Multiple new bilateral pulmonary nodules measuring up to 7.6 mm, likely infectious/inflammatory but considered suspicious based on size criteria. Follow up low-dose chest CT without contrast in 3 months (please use the following order, "CT CHEST LCS NODULE FOLLOW-UP W/O CM") is recommended. Alternatively, PET may be considered when there is a solid component 1mm or larger. 2. Interval resolution of the clustered tiny nodules in the posterior left lower lobe seen previously. 3. Aortic Atherosclerosis (ICD10-I70.0) and Emphysema (ICD10-J43.9).

## 2020-11-07 NOTE — Telephone Encounter (Signed)
Noted.  Will close encounter.  

## 2020-11-07 NOTE — Addendum Note (Signed)
Addended by: Claudette Head A on: 11/07/2020 04:23 PM   Modules accepted: Orders

## 2020-11-07 NOTE — Addendum Note (Signed)
Addended by: Claudette Head A on: 11/07/2020 04:10 PM   Modules accepted: Orders

## 2020-12-04 ENCOUNTER — Ambulatory Visit: Payer: Medicaid Other | Admitting: Pulmonary Disease

## 2020-12-12 ENCOUNTER — Other Ambulatory Visit: Payer: Self-pay | Admitting: Pulmonary Disease

## 2020-12-16 NOTE — Progress Notes (Signed)
12/17/2020 8:19 AM   Bradley Sawyer 08-14-1956 [id number]  Referring provider: Curly Rim, MD [name] [address]  [place] [address]  Chief Complaint  Patient presents with  . Benign Prostatic Hypertrophy   Urological history 1. High risk hematuria - former smoker - Contrast CT in 01/2017 revealed a small cyst at inferior pole RIGHT kidney 2.2 x 1.7 cm image 41. Adrenal glands, kidneys, ureters, and bladder otherwise normal appearance.  Minimal prostatic enlargement with scattered prostatic calcifications. Seminal vesicles unremarkable - Cystoscopy in 03/2017 with Dr. Pilar Jarvis noted an enlarged prostate Visually obstructive - 4 cm in length - CTU 09/13/2019 The prostate gland indents the bladder base.  Adrenal glands normal. 2.6 by 2.1 cm right kidney lower pole fluid density lesion without enhancement, compatible with simple cyst.  Small vascular calcification in the left renal hilum. No urinary tract calculus observed. No significant abnormal filling defect or abnormal enhancement along the urothelium.  The prostate gland measures 5.2 by 4.2 by 5.2 cm (volume = 59 cm^3) and indents the bladder base. Similar pattern of left posterolateral peripheral zone calcifications near the apex - Cysto with Dr. Bernardo Heater 09/2019 Prominent lateral lobe enlargement prostate, hypervascularity and moderate median lobe - UA 3-10 RBC's - but likely due to UTI  2. Orchitis/hydrocele - Scrotal ultrasound in 03/2017 revealed no evidence of testicular torsion.  New rounded hypoechoic lesion in upper left testicle which shows mild hypervascularity. This is suspicious for orchitis given evidence of associated epididymitis and its new appearance since prior study approximately 1 month ago. Testicular neoplasm is considered less likely, and follow-up by ultrasound is recommended in 2-3 months.  Probable mild left epididymitis.  Moderate complex left hydrocele, and small right hydrocele - Tumor  markers were negative - sought a second opinion regarding our recommendation in 05/2017 with urology in [facility] and they also recommended a follow up scrotal ultrasound which he did not follow up with the repeat imaging - SUS 08/2019 Normal-appearing testicles bilaterally. The previously seen area of decreased echogenicity within the left testicle as resolved.  Epididymal cysts bilaterally stable in appearance from the prior exam.  Bilateral hydroceles are noted although improved from the prior study particularly on the left.  3. BPH with LU TS - PSA 0.5 in 11/2019 - I PSS 7/2 - PVR 0 mL - managed with tamsulosin 0.4 mg daily    HPI: Bradley Sawyer is a 65 y.o. male who presents today for a one year follow up.   He has noted a slight increase in urgency.  He is also having hesitancy.  Patient denies any modifying or aggravating factors.  Patient denies any gross hematuria, dysuria or suprapubic/flank pain.  Patient denies any fevers, chills, nausea or vomiting.   UA nitrite positive, > 30 WBC's, 3-10 RBC's and moderate bacteria.  PVR is 0 mL.     IPSS    Row Name 12/17/20 1100         International Prostate Symptom Score   How often have you had the sensation of not emptying your bladder? Less than 1 in 5     How often have you had to urinate less than every two hours? Less than 1 in 5 times     How often have you found you stopped and started again several times when you urinated? Less than 1 in 5 times     How often have you found it difficult to postpone urination? Not at All  How often have you had a weak urinary stream? Less than half the time     How often have you had to strain to start urination? Not at All     How many times did you typically get up at night to urinate? 2 Times     Total IPSS Score 7           Quality of Life due to urinary symptoms   If you were to spend the rest of your life with your urinary condition just the way it is now how would you feel  about that? Mostly Satisfied            Score:  1-7 Mild 8-19 Moderate 20-35 Severe     PMH: Past Medical History:  Diagnosis Date  . Anxiety   . Arthritis   . 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 Cocaine dependence    Last use 1998, attends 3 NA meetings weekly.  . Compression fracture    L2  . COPD (chronic obstructive pulmonary disease) (HCC)    bronchitis  . CVA (cerebral infarction)   . Disorder of vocal cord   . Fatty liver   . GERD (gastroesophageal reflux disease)   . Hx of cardiovascular stress test    ETT/Lexiscan-Myoview (10/15):  Inferior, inferoapical, apical lateral defect c/w mild ischemia, EF 66%; Intermediate Risk  . Hyperlipidemia   . Hypertension   . Right shoulder pain    2/2 partial rotator cuff tear, tendinopathy, mild subacromial/subdeltioid bursitis, A/C joint arthropathy per MRI done in 3/07  . Tobacco abuse     Surgical History: Past Surgical History:  Procedure Laterality Date  . ANTERIOR LAT LUMBAR FUSION N/A 12/18/2013   Procedure: Lumbar Two Anteriorlateral Corpectomy w/ cage, interbody fusion, anterior plating, Lumbar One to Lumbar Three percutaneous pedicle screws;  Surgeon: Charlie Pitter, MD;  Location: [place] [facility];  Service: Neurosurgery;  Laterality: N/A;  . AORTA -INNOMIATE BYPASS N/A 10/15/2014   Procedure: ENDARTERCTOMY OF  INNOMINATE ARTERY ;  Surgeon: Elam Dutch, MD;  Location: [place];  Service: Vascular;  Laterality: N/A;  . ARCH AORTOGRAM N/A 08/23/2014   Procedure: ARCH AORTOGRAM;  Surgeon: Elam Dutch, MD;  Location: [facility] CATH LAB;  Service: Cardiovascular;  Laterality: N/A;  . arch aortogram, left carotid,left subclavian angiogram  08-23-2014  . CAROTID ANGIOGRAM Left 08/23/2014   Procedure: CAROTID ANGIOGRAM;  Surgeon: Elam Dutch, MD;  Location: [facility] CATH LAB;  Service: Cardiovascular;  Laterality: Left;  . CHOLECYSTECTOMY  12/2016  . ENDARTERECTOMY Right 10/15/2014   Procedure:  ENDARTERECTOMY RIGHT SUBCLAVIAN ARTERY;  Surgeon: Elam Dutch, MD;  Location: [place];  Service: Vascular;  Laterality: Right;  . ENDARTERECTOMY Right 10/15/2014   Procedure: ENDARTERECTOMY RIGHT COMMON CAROTID ARTERY;  Surgeon: Elam Dutch, MD;  Location: [place];  Service: Vascular;  Laterality: Right;  . 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 LEFT HEART CATHETERIZATION WITH CORONARY ANGIOGRAM N/A 09/12/2014   Procedure: LEFT HEART CATHETERIZATION WITH CORONARY ANGIOGRAM;  Surgeon: Josue Hector, MD;  Location[facility] CATH LAB;  Service: Cardiovascular;  Laterality: N/A;  . LUMBAR SPINE SURGERY  12/18/2013   L2     DR POOL  . NECK SURGERY    . PATCH ANGIOPLASTY Right 10/15/2014   Procedure: PATCH ANGIOPLASTY OF RIGHT SUBCLAVIAN ARTERY , RIGHT COMMON CAROTID ARTERY & INNOMINATE ARTERY;  Surgeon: Elam Dutch, MD;  Location: [place];  Service: Vascular;  Laterality: Right;  .  STERNOTOMY N/A 10/15/2014   Procedure: PARTIAL STERNOTOMY & PLATING OF STERNUM;  Surgeon: Elam Dutch, MD;  Location: [place];  Service: Vascular;  Laterality: N/A;  . TEE WITHOUT CARDIOVERSION N/A 12/25/2013   Procedure: TRANSESOPHAGEAL ECHOCARDIOGRAM (TEE);  Surgeon: Larey Dresser, MD;  Location: [place];  Service: Cardiovascular;  Laterality: N/A;    Home Medications:  Allergies as of 12/17/2020      Reactions   Bee Venom Swelling      Medication List       Accurate as of December 17, 2020 11:59 PM. If you have any questions, ask your nurse or doctor.        Advair Diskus 500-50 MCG/DOSE Aepb Generic drug: Fluticasone-Salmeterol USE 1 INHALATION BY MOUTH EVERY 12 HOURS. <<RINSE MOUTH AFTER USE>>.   amLODipine 5 MG tablet Commonly known as: NORVASC Take by mouth.   aspirin EC 81 MG tablet Take 81 mg by mouth daily.   atorvastatin 80 MG tablet Commonly known as: LIPITOR Take 80 mg by mouth daily.   cyclobenzaprine 10 MG tablet Commonly known as: FLEXERIL   docusate sodium 100  MG capsule Commonly known as: COLACE Take 1 capsule (100 mg total) by mouth 2 (two) times daily.   EPINEPHrine 0.3 mg/0.3 mL Soaj injection Commonly known as: EPI-PEN Inject into the muscle.   FLUoxetine 10 MG capsule Commonly known as: PROZAC Take 1 capsule (10 mg total) by mouth daily.   isosorbide mononitrate 30 MG 24 hr tablet Commonly known as: IMDUR Take by mouth.   ketorolac 10 MG tablet Commonly known as: TORADOL Take 1 tablet (10 mg total) by mouth every 6 (six) hours as needed.   levocetirizine 5 MG tablet Commonly known as: XYZAL Take 5 mg by mouth at bedtime.   levofloxacin 500 MG tablet Commonly known as: LEVAQUIN Take 1 tablet (500 mg total) by mouth daily.   linaclotide 290 MCG Caps capsule Commonly known as: LINZESS Take by mouth.   lisinopril-hydrochlorothiazide 20-12.5 MG tablet Commonly known as: ZESTORETIC TAKE 1 TABLET BY MOUTH DAILY.   meloxicam 15 MG tablet Commonly known as: MOBIC TAKE 1 TABLET BY MOUTH EVERY DAY   metFORMIN 500 MG tablet Commonly known as: GLUCOPHAGE Take 500 mg by mouth daily.   metoprolol tartrate 100 MG tablet Commonly known as: LOPRESSOR Take by mouth.   montelukast 10 MG tablet Commonly known as: SINGULAIR Take 10 mg by mouth daily.   Multi-Vitamins Tabs Take by mouth.   mupirocin ointment 2 % Commonly known as: BACTROBAN Apply to inside of each nostril twice daily for 7 days   naloxone 4 MG/0.1ML Liqd nasal spray kit Commonly known as: NARCAN INHALE ONE SPRAY EACH NOSTRIL FOR OPIOID EMERGENCY   nitroGLYCERIN 0.4 MG SL tablet Commonly known as: NITROSTAT SMARTSIG:1 Sublingual Every Night   ondansetron 4 MG tablet Commonly known as: Zofran Take 1 tablet (4 mg total) by mouth every 8 (eight) hours as needed for nausea or vomiting.   oxyCODONE 15 MG immediate release tablet Commonly known as: ROXICODONE Take by mouth.   pantoprazole 40 MG tablet Commonly known as: PROTONIX TAKE 1 TABLET (40 MG  TOTAL) BY MOUTH DAILY.   polyethylene glycol 17 g packet Commonly known as: MIRALAX / GLYCOLAX Take 17 g by mouth daily as needed (for constipation).   pregabalin 150 MG capsule Commonly known as: LYRICA Take by mouth.   ProAir HFA 108 (90 Base) MCG/ACT inhaler Generic drug: albuterol INHALE 2 PUFFS INTO THE LUNGS EVERY 6 (SIX)  HOURS AS NEEDED FOR WHEEZING.   albuterol (2.5 MG/3ML) 0.083% nebulizer solution Commonly known as: PROVENTIL USE 1 AMPULE IN NEBULIZER EVERY 6 HOURS AS NEEDED.   rOPINIRole 1 MG tablet Commonly known as: REQUIP Take 1 mg by mouth at bedtime.   Spiriva HandiHaler 18 MCG inhalation capsule Generic drug: tiotropium INHALE 1 CAPSULE AS DIRECTED ONCE DAILY.   tamsulosin 0.4 MG Caps capsule Commonly known as: FLOMAX TAKE (1) CAPSULE BY MOUTH ONCE DAILY.       Allergies:  Allergies  Allergen Reactions  . Bee Venom Swelling    Family History: Family History  Problem Relation Age of Onset  . Heart disease Father   . Heart attack Father   . Diabetes Paternal Grandmother        1 st degree relatives  . Hypertension Paternal Grandmother   . Diabetes Brother   . Heart disease Brother   . Muscular dystrophy Brother   . Muscular dystrophy Sister   . Prostate cancer Neg Hx   . Kidney cancer Neg Hx   . Bladder Cancer Neg Hx     Social History:  reports that he has quit smoking. His smoking use included cigarettes. He has a 96.00 pack-year smoking history. He has never used smokeless tobacco. He reports that he does not drink alcohol and does not use drugs.  For pertinent review of systems please refer to history of present illness  Physical Exam: BP 110/69   Pulse 74   Ht '5\' 11"'  (1.803 m)   Wt 239 lb (108.4 kg)   BMI 33.33 kg/m   Constitutional:  Well nourished. Alert and oriented, No acute distress. HEENT: [place] AT, mask in place.  Trachea midline Cardiovascular: No clubbing, cyanosis, or edema. Respiratory: Normal respiratory effort, no  increased work of breathing. GU: No CVA tenderness.  No bladder fullness or masses.  Patient with circumcised phallus. Urethral meatus is patent.  No penile discharge. No penile lesions or rashes. Scrotum without lesions, cysts, rashes and/or edema.  Testicles are located scrotally bilaterally. No masses are appreciated in the testicles. Left and right epididymis are normal. Rectal: Patient with  normal sphincter tone. Anus and perineum without scarring or rashes. No rectal masses are appreciated. Prostate is approximately 50 grams, no nodules are appreciated. Seminal vesicles could not be palpated Lymph: No inguinal adenopathy. Neurologic: Grossly intact, no focal deficits, moving all 4 extremities. Psychiatric: Normal mood and affect.  Laboratory Data: PSA Trend  0.4 on 10/21/2015  0.5 on 12/05/2019  Urinalysis Component     Latest Ref Rng & Units 12/17/2020  Specific Gravity, UA     1.005 - 1.030 1.020  pH, UA     5.0 - 7.5 5.5  Color, UA     Yellow Yellow  Appearance Ur     Clear Cloudy (A)  Leukocytes,UA     Negative 1+ (A)  Protein,UA     Negative/Trace Negative  Glucose, UA     Negative Negative  Ketones, UA     Negative Negative  RBC, UA     Negative 1+ (A)  Bilirubin, UA     Negative Negative  Urobilinogen, Ur     0.2 - 1.0 mg/dL 0.2  Nitrite, UA     Negative Positive (A)  Microscopic Examination      See below:   Component     Latest Ref Rng & Units 12/17/2020  WBC, UA     0 - 5 /hpf >30 (A)  RBC  0 - 2 /hpf 3-10 (A)  Epithelial Cells (non renal)     0 - 10 /hpf 0-10  Bacteria, UA     None seen/Few Moderate (A)  I have reviewed the labs.   Pertinent Imaging: Results for NOEMI, ISHMAEL (MRN [id number]) as of 12/17/2020 11:46  Ref. Range 12/17/2020 11:25  Scan Result Unknown 0    Assessment & Plan:    1. High risk hematuria - work up completed 09/2019 - UA 3-10 RBC's - but nitrite positive - culture, treat and recheck UA in one month   2.  Orchitis/hydrocele Resolved  3. BPH with LUTS IPSS score is 7/2, it is improved  Continue conservative management, avoiding bladder irritants and timed voiding's Most bothersome symptoms is/are hesitancy Discussed bladder outlet procedure, he is not interested in pursuing surgery at this time  Continue tamsulosin 0.4 mg daily RTC in one year for I PSS, PVR and exam  4. Pulmonary nodule Followed by PCP  5. Infrarenal abdominal aortic aneurysm PCP notified - needs repeat ultrasound 2023   Return in about 1 month (around 01/17/2021) for UA only .  These notes generated with voice recognition software. I apologize for typographical errors.  Zara Council, PA-C  [facility] [address]  [place] [address], [place] [address] [phone]

## 2020-12-17 ENCOUNTER — Other Ambulatory Visit: Payer: Self-pay

## 2020-12-17 ENCOUNTER — Ambulatory Visit (INDEPENDENT_AMBULATORY_CARE_PROVIDER_SITE_OTHER): Payer: Medicaid Other | Admitting: Urology

## 2020-12-17 ENCOUNTER — Encounter: Payer: Self-pay | Admitting: Urology

## 2020-12-17 VITALS — BP 110/69 | HR 74 | Ht 71.0 in | Wt 239.0 lb

## 2020-12-17 DIAGNOSIS — R3129 Other microscopic hematuria: Secondary | ICD-10-CM | POA: Diagnosis not present

## 2020-12-17 DIAGNOSIS — N451 Epididymitis: Secondary | ICD-10-CM

## 2020-12-17 DIAGNOSIS — N401 Enlarged prostate with lower urinary tract symptoms: Secondary | ICD-10-CM

## 2020-12-17 DIAGNOSIS — R351 Nocturia: Secondary | ICD-10-CM | POA: Diagnosis not present

## 2020-12-17 DIAGNOSIS — R319 Hematuria, unspecified: Secondary | ICD-10-CM

## 2020-12-17 LAB — BLADDER SCAN AMB NON-IMAGING: Scan Result: 0

## 2020-12-18 ENCOUNTER — Telehealth: Payer: Self-pay | Admitting: Family Medicine

## 2020-12-18 LAB — PSA: Prostate Specific Ag, Serum: 0.3 ng/mL (ref 0.0–4.0)

## 2020-12-18 NOTE — Telephone Encounter (Signed)
-----   Message from Nori Riis, PA-C sent at 12/18/2020  8:19 AM EST ----- Please let [name] know that his PSA is good at 0.3.

## 2020-12-18 NOTE — Telephone Encounter (Signed)
Patient notified and voiced understanding.

## 2020-12-19 LAB — URINALYSIS, COMPLETE
Bilirubin, UA: NEGATIVE
Glucose, UA: NEGATIVE
Ketones, UA: NEGATIVE
Nitrite, UA: POSITIVE — AB
Protein,UA: NEGATIVE
Specific Gravity, UA: 1.02 (ref 1.005–1.030)
Urobilinogen, Ur: 0.2 mg/dL (ref 0.2–1.0)
pH, UA: 5.5 (ref 5.0–7.5)

## 2020-12-19 LAB — MICROSCOPIC EXAMINATION: WBC, UA: >30 /hpf — AB (ref 0–5)

## 2020-12-21 LAB — CULTURE, URINE COMPREHENSIVE

## 2020-12-22 ENCOUNTER — Telehealth: Payer: Self-pay | Admitting: Family Medicine

## 2020-12-22 MED ORDER — SULFAMETHOXAZOLE-TRIMETHOPRIM 800-160 MG PO TABS: 1.0000 | ORAL_TABLET | Freq: Two times a day (BID) | ORAL | 0 refills | Status: DC

## 2020-12-22 NOTE — Telephone Encounter (Signed)
Patient notified and will pick up ABX. He is aware of the upcoming appointment.

## 2020-12-22 NOTE — Telephone Encounter (Signed)
-----   Message from Nori Riis, PA-C sent at 12/21/2020  8:58 PM EST ----- Please let Mr. Agner know that his urine culture was positive and we need to start Septra DS, twice daily x 7 days and remember to come on the 28th.

## 2021-01-13 ENCOUNTER — Other Ambulatory Visit: Payer: Self-pay

## 2021-01-13 DIAGNOSIS — Z87448 Personal history of other diseases of urinary system: Secondary | ICD-10-CM

## 2021-01-19 ENCOUNTER — Other Ambulatory Visit: Payer: Medicaid Other

## 2021-01-19 ENCOUNTER — Other Ambulatory Visit: Payer: Self-pay

## 2021-01-19 DIAGNOSIS — Z87448 Personal history of other diseases of urinary system: Secondary | ICD-10-CM

## 2021-01-19 LAB — URINALYSIS, COMPLETE
Bilirubin, UA: NEGATIVE
Glucose, UA: NEGATIVE
Ketones, UA: NEGATIVE
Nitrite, UA: NEGATIVE
Protein,UA: NEGATIVE
Specific Gravity, UA: 1.025 (ref 1.005–1.030)
Urobilinogen, Ur: 0.2 mg/dL (ref 0.2–1.0)
pH, UA: 5 (ref 5.0–7.5)

## 2021-01-19 LAB — MICROSCOPIC EXAMINATION: WBC, UA: >30 /hpf — AB (ref 0–5)

## 2021-01-20 ENCOUNTER — Ambulatory Visit
Admission: RE | Admit: 2021-01-20 | Discharge: 2021-01-20 | Disposition: A | Discharge status: Home / Self Care | Payer: Medicaid Other | Attending: Urology | Admitting: Urology

## 2021-01-20 ENCOUNTER — Telehealth: Payer: Self-pay | Admitting: Family Medicine

## 2021-01-20 ENCOUNTER — Ambulatory Visit
Admission: RE | Admit: 2021-01-20 | Discharge: 2021-01-20 | Disposition: A | Discharge status: Home / Self Care | Payer: Medicaid Other | Source: Ambulatory Visit | Attending: Urology | Admitting: Urology

## 2021-01-20 DIAGNOSIS — R3129 Other microscopic hematuria: Secondary | ICD-10-CM | POA: Insufficient documentation

## 2021-01-20 NOTE — Telephone Encounter (Signed)
Patient notified and voiced understanding.

## 2021-01-20 NOTE — Telephone Encounter (Signed)
-----   Message from Nori Riis, PA-C sent at 01/19/2021  9:58 PM EST ----- Please let Mr. Gosa know that his urine is unchanged.  I suggest he have an Xray to evaluate for stones.

## 2021-01-22 ENCOUNTER — Telehealth: Payer: Self-pay | Admitting: *Deleted

## 2021-01-22 NOTE — Telephone Encounter (Signed)
Patient scheduled for screening CT  02/26/2021 at 12:30PM, no change in insurance reports that he quit smoking 5 weeks ago.

## 2021-01-26 ENCOUNTER — Other Ambulatory Visit: Payer: Self-pay | Admitting: *Deleted

## 2021-01-26 ENCOUNTER — Telehealth: Payer: Self-pay | Admitting: Family Medicine

## 2021-01-26 DIAGNOSIS — R918 Other nonspecific abnormal finding of lung field: Secondary | ICD-10-CM

## 2021-01-26 DIAGNOSIS — Z87891 Personal history of nicotine dependence: Secondary | ICD-10-CM

## 2021-01-26 NOTE — Telephone Encounter (Signed)
Patient notified and voiced understanding. Appointment was made for 1 year.

## 2021-01-26 NOTE — Progress Notes (Signed)
Contacted and scheduled for LCS nodule follow up scan. Patient is a former smoker, quit 10/06/20, 96 pack year history.

## 2021-01-26 NOTE — Telephone Encounter (Signed)
-----   Message from Nori Riis, PA-C sent at 01/26/2021 11:27 AM EST ----- Please let Bradley Sawyer know that his Xray did not show any stones.  He may be colonized with bacteria.  We do need to see him next year for recheck.  If his urine still has the blood in it, we will need to repeat the hematuria work up.

## 2021-02-10 ENCOUNTER — Encounter: Payer: Self-pay | Admitting: Pulmonary Disease

## 2021-02-10 ENCOUNTER — Ambulatory Visit (INDEPENDENT_AMBULATORY_CARE_PROVIDER_SITE_OTHER): Payer: Medicaid Other | Admitting: Pulmonary Disease

## 2021-02-10 ENCOUNTER — Other Ambulatory Visit: Payer: Self-pay

## 2021-02-10 VITALS — BP 130/86 | HR 73 | Temp 98.0°F | Ht 71.0 in | Wt 231.0 lb

## 2021-02-10 DIAGNOSIS — J449 Chronic obstructive pulmonary disease, unspecified: Secondary | ICD-10-CM | POA: Diagnosis not present

## 2021-02-10 DIAGNOSIS — Z87891 Personal history of nicotine dependence: Secondary | ICD-10-CM

## 2021-02-10 DIAGNOSIS — J439 Emphysema, unspecified: Secondary | ICD-10-CM | POA: Diagnosis not present

## 2021-02-10 DIAGNOSIS — G4736 Sleep related hypoventilation in conditions classified elsewhere: Secondary | ICD-10-CM | POA: Diagnosis not present

## 2021-02-10 MED ORDER — PREDNISONE 20 MG PO TABS: 20.0000 mg | ORAL_TABLET | Freq: Every day | ORAL | 0 refills | Status: DC

## 2021-02-10 MED ORDER — BREZTRI AEROSPHERE 160-9-4.8 MCG/ACT IN AERO: 2.0000 | INHALATION_SPRAY | Freq: Two times a day (BID) | RESPIRATORY_TRACT | 0 refills | Status: DC

## 2021-02-10 NOTE — Patient Instructions (Signed)
We are giving you a trial of an inhaler called Breztri, 2 puffs twice a day.  Rinse your mouth well after use.  Let us know how you are doing with this inhaler.  We are giving a prescription of prednisone that will be 1 tablet daily for 5 days.   DO NOT USE THE ADVAIR OR SPIRIVA WHILE ON THE BREZTRI.  You may still use your rescue inhaler if needed during the day for shortness of breath or wheezing.   We will see you in follow-up in 6 weeks time call sooner should any new problems arise.  Please do let us know if the Judithann Sauger is working so we can call in the prescription for you.

## 2021-02-10 NOTE — Progress Notes (Signed)
Subjective:    Patient ID: Bradley Sawyer, male    DOB: December 12, 1955, 65 y.o.   MRN: [id number] Chief Complaint  Patient presents with  . Follow-up    C/o sob and prod cough with pale yellow sputum   HPI This is a 65 year old former smoker (96-pack-year history) with severe COPD who presents for follow-up of the same.  This is a scheduled visit, he was first evaluated by me in November 2020.  He is compliant with nocturnal oxygen use.  He is currently maintained on Advair 500/50, 1 inhalation twice a day and Spiriva HandiHaler 1 capsule inhaled daily.    We last saw him on August 22, 2020.  At time has not had any major exacerbations.  He does not believe Advair and Spiriva are "as good anymore"  Last pulmonary function testing was performed in November 2021: Showed FEV1 of 1. 83 L or 50 % predicted with an FEV1/FVC of 51% lung volumes showed hyperinflation and air trapping, diffusion capacity was moderately reduced.  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  Patient's cough quality has not changed.  Sputum production unchanged.  No hemoptysis.  He is enrolled in lung cancer screening.  He does note increasing dyspnea, he is to have a left heart cath by Dr. Humphrey Rolls.  He is compliant with his nocturnal oxygen at 2 L/min.   Review of Systems A 10 point review of systems was performed and it is as noted above otherwise negative.  Patient Active Problem List   Diagnosis Date Noted  . COPD exacerbation ([place]) 02/27/2021  . History of CVA (cerebrovascular accident) 02/27/2021  . Chronic pain, radicular 02/27/2021  . Chronic, continuous use of opioids 02/27/2021  . Unstable angina ([place]) 02/19/2021  . Cigarette nicotine dependence without complication 38/08/1750  . Acute lower UTI 02/19/2017  . UTI (urinary tract infection) 02/19/2017  . Left inguinal hernia 08/12/2016  . Elevated troponin 05/08/2016  . Anal fissure 03/15/2016  . External  hemorrhoid 03/15/2016  . Acute viral pharyngitis 03/20/2015  . Olecranon bursitis of left elbow 12/23/2014  . Embolic stroke ([place]) 02/58/5277  . Carotid stenosis 10/15/2014  . 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 PAD (peripheral artery disease) ([place]) 08/23/2014  . Preventative health care 08/12/2014  . Occlusion and stenosis of carotid artery without mention of cerebral infarction 07/25/2014  . Subclavian steal syndrome 07/25/2014  . CAD (coronary artery disease) 07/10/2014  . Nocturia 04/17/2014  . Testicular pain, right 04/17/2014  . Urethral stricture 04/17/2014  . CVA (cerebral infarction) 04/10/2014  . 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 Hematuria 08/06/2013  . Compression fracture of L2, with back and right groin pain 07/18/2013  . History of colon polyps 05/18/2012  . COPD (chronic obstructive pulmonary disease) ([place]) 12/24/2009  . Hyperlipemia 09/20/2006  . TOBACCO ABUSE 09/20/2006  . Essential hypertension 09/20/2006  . GERD 09/20/2006   Social History   Tobacco Use  . Smoking status: Former Smoker    Packs/day: 2.00    Years: 48.00    Pack years: 96.00    Types: Cigarettes    Quit date: 2022    Years since quitting: 0.3  . Smokeless tobacco: Never Used  Substance Use Topics  . Alcohol use: No    Alcohol/week: 0.0 standard drinks   Allergies  Allergen Reactions  . Bee Venom Swelling   Current Meds  Medication Sig  .  ADVAIR DISKUS 500-50 MCG/DOSE AEPB USE 1 INHALATION BY MOUTH EVERY 12 HOURS. <<RINSE MOUTH AFTER USE>>.  . albuterol (PROVENTIL) (2.5 MG/3ML) 0.083% nebulizer solution USE 1 AMPULE IN NEBULIZER EVERY 6 HOURS AS NEEDED.  Marland Kitchen amLODipine (NORVASC) 5 MG tablet Take by mouth.  Marland Kitchen aspirin EC 81 MG tablet Take 81 mg by mouth daily.  Marland Kitchen atorvastatin (LIPITOR) 80 MG tablet Take 80 mg by mouth daily.      . cyclobenzaprine (FLEXERIL) 10 MG tablet   . docusate  sodium (COLACE) 100 MG capsule Take 1 capsule (100 mg total) by mouth 2 (two) times daily.  Marland Kitchen EPINEPHrine 0.3 mg/0.3 mL IJ SOAJ injection Inject into the muscle.  Marland Kitchen FLUoxetine (PROZAC) 10 MG capsule Take 1 capsule (10 mg total) by mouth daily.  . isosorbide mononitrate (IMDUR) 30 MG 24 hr tablet Take by mouth.  Marland Kitchen ketorolac (TORADOL) 10 MG tablet Take 1 tablet (10 mg total) by mouth every 6 (six) hours as needed.  Marland Kitchen levocetirizine (XYZAL) 5 MG tablet Take 5 mg by mouth at bedtime.  Marland Kitchen linaclotide (LINZESS) 290 MCG CAPS capsule Take by mouth.  Marland Kitchen lisinopril-hydrochlorothiazide (PRINZIDE,ZESTORETIC) 20-12.5 MG per tablet TAKE 1 TABLET BY MOUTH DAILY.  . meloxicam (MOBIC) 15 MG tablet TAKE 1 TABLET BY MOUTH EVERY DAY  . metFORMIN (GLUCOPHAGE) 500 MG tablet Take 500 mg by mouth daily.  . metoprolol (LOPRESSOR) 100 MG tablet Take by mouth.  . montelukast (SINGULAIR) 10 MG tablet Take 10 mg by mouth daily.  . Multiple Vitamin (MULTI-VITAMINS) TABS Take by mouth.  . mupirocin ointment (BACTROBAN) 2 % Apply to inside of each nostril twice daily for 7 days  . naloxone (NARCAN) nasal spray 4 mg/0.1 mL INHALE ONE SPRAY EACH NOSTRIL FOR OPIOID EMERGENCY  . nitroGLYCERIN (NITROSTAT) 0.4 MG SL tablet SMARTSIG:1 Sublingual Every Night  . ondansetron (ZOFRAN) 4 MG tablet Take 1 tablet (4 mg total) by mouth every 8 (eight) hours as needed for nausea or vomiting.  Marland Kitchen oxyCODONE (ROXICODONE) 15 MG immediate release tablet Take by mouth.  . pantoprazole (PROTONIX) 40 MG tablet TAKE 1 TABLET (40 MG TOTAL) BY MOUTH DAILY.  Marland Kitchen polyethylene glycol (MIRALAX / GLYCOLAX) packet Take 17 g by mouth daily as needed (for constipation).  . pregabalin (LYRICA) 150 MG capsule Take by mouth.  Marland Kitchen PROAIR HFA 108 (90 BASE) MCG/ACT inhaler INHALE 2 PUFFS INTO THE LUNGS EVERY 6 (SIX) HOURS AS NEEDED FOR WHEEZING.  . rOPINIRole (REQUIP) 1 MG tablet Take 1 mg by mouth at bedtime.  Marland Kitchen SPIRIVA HANDIHALER 18 MCG inhalation capsule INHALE 1  CAPSULE AS DIRECTED ONCE DAILY.  . tamsulosin (FLOMAX) 0.4 MG CAPS capsule TAKE (1) CAPSULE BY MOUTH ONCE DAILY.  . [DISCONTINUED] levofloxacin (LEVAQUIN) 500 MG tablet Take 1 tablet (500 mg total) by mouth daily.  . [DISCONTINUED] sulfamethoxazole-trimethoprim (BACTRIM DS) 800-160 MG tablet Take 1 tablet by mouth every 12 (twelve) hours.   Immunization History  Administered Date(s) Administered  . Influenza, Seasonal, Injecte, Preservative Fre 08/01/2013, 08/12/2014, 08/26/2015, 08/06/2016, 09/26/2019  . Influenza,inj,Quad PF,6+ Mos 08/01/2013, 08/12/2014  . Influenza-Unspecified 10/30/2018, 09/10/2020  . Moderna Sars-Covid-2 Vaccination 02/06/2020, 03/05/2020  . Pneumococcal Polysaccharide-23 10/20/2014  . Pneumococcal-Unspecified 10/20/2014      Objective:   Physical Exam BP 130/86 (BP Location: Left Arm, Cuff Size: Normal)   Pulse 73   Temp 98 F (36.7 C) (Temporal)   Ht 5\' 11"  (1.803 m)   Wt 231 lb (104.8 kg)   SpO2 94%   BMI 32.22 kg/m  GENERAL: Well-developed, overweight gentleman, no acute distress. Disheveled. HEAD: Normocephalic, atraumatic.  EYES: Pupils equal, round, reactive to light.  No scleral icterus.  MOUTH: Nose/mouth/throat not examined due to masking requirements for COVID 19. NECK: Supple. No thyromegaly. Trachea midline. No JVD.  No adenopathy. PULMONARY: Good air entry bilaterally.  End expiratory wheezing throughout.  Rhonchi throughout.   CARDIOVASCULAR: S1 and S2. Regular rate and rhythm.  No rubs, murmurs or gallops heard. ABDOMEN: Benign. MUSCULOSKELETAL: No joint deformity, no clubbing, no edema.  NEUROLOGIC: No focal deficits, no gait disturbance.  Speech is fluent. SKIN: Intact,warm,dry. PSYCH: Mood and behavior normal.     Assessment & Plan:     ICD-10-CM   1. COPD with chronic bronchitis and emphysema ([place])  J44.9    We will give him a trial of Breztri 2 puffs twice a day He is quite bronchospastic as this is baseline Short course of  prednisone Stop Advair/Spiriva  2. Nocturnal hypoxemia due to emphysema ([facility])  J43.9    G47.36    Continue oxygen at 2 L/min  3. Former heavy cigarette smoker (20-39 per day)  Z87.891    Patient remains abstinent of cigarettes   Meds ordered this encounter  Medications  . Budeson-Glycopyrrol-Formoterol (BREZTRI AEROSPHERE) 160-9-4.8 MCG/ACT AERO    Sig: Inhale 2 puffs into the lungs in the morning and at bedtime.    Dispense:  5.9 g    Refill:  0    Order Specific Question:   Lot Number?    Answer:   [id number]    Order Specific Question:   Expiration Date?    Answer:   06/22/2022    Order Specific Question:   Manufacturer?    Answer:   [employer] [[id number]]    Order Specific Question:   Quantity    Answer:   2  . predniSONE (DELTASONE) 20 MG tablet    Sig: Take 1 tablet (20 mg total) by mouth daily with breakfast.    Dispense:  5 tablet    Refill:  0   We are giving the patient a trial of Breztri 2 puffs twice a day.  He is to discontinue use of Advair and Spiriva while in the [place].  He is to let us know how he is doing on this inhaler.  I am giving him a short course of prednisone he continues to be bronchospastic this is the norm for him every time he comes to the clinic.  He is to use albuterol as needed.  We will see him in follow-up in 6 weeks time he is to contact us prior to that time should any new difficulties arise.   Renold Don, MD [facility]   *This note was dictated using voice recognition software/Dragon.  Despite best efforts to proofread, errors can occur which can change the meaning.  Any change was purely unintentional.

## 2021-02-19 ENCOUNTER — Other Ambulatory Visit: Payer: Self-pay | Admitting: Pulmonary Disease

## 2021-02-19 ENCOUNTER — Ambulatory Visit: Payer: Self-pay | Admitting: Cardiovascular Disease

## 2021-02-19 DIAGNOSIS — I200 Unstable angina: Secondary | ICD-10-CM | POA: Insufficient documentation

## 2021-02-19 MED ORDER — SODIUM CHLORIDE 0.9% FLUSH
3.0000 mL | Freq: Two times a day (BID) | INTRAVENOUS | Status: AC
  Filled 2021-02-19: qty 3

## 2021-02-24 ENCOUNTER — Other Ambulatory Visit: Payer: Medicaid Other | Attending: Cardiovascular Disease

## 2021-02-24 ENCOUNTER — Telehealth: Payer: Self-pay | Admitting: Pulmonary Disease

## 2021-02-24 MED ORDER — BREZTRI AEROSPHERE 160-9-4.8 MCG/ACT IN AERO: 2.0000 | INHALATION_SPRAY | Freq: Two times a day (BID) | RESPIRATORY_TRACT | 11 refills | Status: DC

## 2021-02-24 NOTE — Telephone Encounter (Signed)
Rx for Bradley Sawyer has been sent to preferred pharmacy, as patient feels that medication is effective.  Nothing further is needed at this time.

## 2021-02-26 ENCOUNTER — Ambulatory Visit: Admission: RE | Admit: 2021-02-26 | Payer: Medicaid Other | Source: Ambulatory Visit

## 2021-02-26 ENCOUNTER — Ambulatory Visit
Admission: RE | Admit: 2021-02-26 | Discharge: 2021-02-26 | Disposition: A | Discharge status: Home / Self Care | Payer: Medicaid Other | Source: Ambulatory Visit | Attending: Nurse Practitioner | Admitting: Nurse Practitioner

## 2021-02-26 ENCOUNTER — Inpatient Hospital Stay
Admission: EM | Admit: 2021-02-26 | Discharge: 2021-03-03 | DRG: 190 | Disposition: A | Discharge status: Home / Self Care | Payer: Medicaid Other | Attending: Internal Medicine | Admitting: Internal Medicine

## 2021-02-26 ENCOUNTER — Emergency Department: Payer: Medicaid Other

## 2021-02-26 ENCOUNTER — Other Ambulatory Visit: Payer: Self-pay

## 2021-02-26 ENCOUNTER — Telehealth: Payer: Self-pay | Admitting: Pulmonary Disease

## 2021-02-26 DIAGNOSIS — Z7982 Long term (current) use of aspirin: Secondary | ICD-10-CM

## 2021-02-26 DIAGNOSIS — Z7902 Long term (current) use of antithrombotics/antiplatelets: Secondary | ICD-10-CM

## 2021-02-26 DIAGNOSIS — Z79899 Other long term (current) drug therapy: Secondary | ICD-10-CM

## 2021-02-26 DIAGNOSIS — Z91030 Bee allergy status: Secondary | ICD-10-CM

## 2021-02-26 DIAGNOSIS — I248 Other forms of acute ischemic heart disease: Secondary | ICD-10-CM | POA: Diagnosis present

## 2021-02-26 DIAGNOSIS — I10 Essential (primary) hypertension: Secondary | ICD-10-CM | POA: Diagnosis present

## 2021-02-26 DIAGNOSIS — Z87891 Personal history of nicotine dependence: Secondary | ICD-10-CM | POA: Insufficient documentation

## 2021-02-26 DIAGNOSIS — Z8673 Personal history of transient ischemic attack (TIA), and cerebral infarction without residual deficits: Secondary | ICD-10-CM

## 2021-02-26 DIAGNOSIS — J438 Other emphysema: Principal | ICD-10-CM | POA: Diagnosis present

## 2021-02-26 DIAGNOSIS — R918 Other nonspecific abnormal finding of lung field: Secondary | ICD-10-CM

## 2021-02-26 DIAGNOSIS — E785 Hyperlipidemia, unspecified: Secondary | ICD-10-CM | POA: Diagnosis present

## 2021-02-26 DIAGNOSIS — F419 Anxiety disorder, unspecified: Secondary | ICD-10-CM | POA: Diagnosis present

## 2021-02-26 DIAGNOSIS — Z8249 Family history of ischemic heart disease and other diseases of the circulatory system: Secondary | ICD-10-CM

## 2021-02-26 DIAGNOSIS — G8929 Other chronic pain: Secondary | ICD-10-CM

## 2021-02-26 DIAGNOSIS — K219 Gastro-esophageal reflux disease without esophagitis: Secondary | ICD-10-CM | POA: Diagnosis present

## 2021-02-26 DIAGNOSIS — Z7951 Long term (current) use of inhaled steroids: Secondary | ICD-10-CM

## 2021-02-26 DIAGNOSIS — Z791 Long term (current) use of non-steroidal anti-inflammatories (NSAID): Secondary | ICD-10-CM

## 2021-02-26 DIAGNOSIS — R778 Other specified abnormalities of plasma proteins: Secondary | ICD-10-CM | POA: Diagnosis present

## 2021-02-26 DIAGNOSIS — Z9981 Dependence on supplemental oxygen: Secondary | ICD-10-CM

## 2021-02-26 DIAGNOSIS — F1120 Opioid dependence, uncomplicated: Secondary | ICD-10-CM | POA: Diagnosis present

## 2021-02-26 DIAGNOSIS — M549 Dorsalgia, unspecified: Secondary | ICD-10-CM | POA: Diagnosis present

## 2021-02-26 DIAGNOSIS — J441 Chronic obstructive pulmonary disease with (acute) exacerbation: Secondary | ICD-10-CM | POA: Diagnosis present

## 2021-02-26 DIAGNOSIS — R7989 Other specified abnormal findings of blood chemistry: Secondary | ICD-10-CM | POA: Diagnosis present

## 2021-02-26 DIAGNOSIS — J449 Chronic obstructive pulmonary disease, unspecified: Secondary | ICD-10-CM

## 2021-02-26 DIAGNOSIS — J9621 Acute and chronic respiratory failure with hypoxia: Secondary | ICD-10-CM | POA: Diagnosis present

## 2021-02-26 DIAGNOSIS — Z7984 Long term (current) use of oral hypoglycemic drugs: Secondary | ICD-10-CM

## 2021-02-26 DIAGNOSIS — Z9049 Acquired absence of other specified parts of digestive tract: Secondary | ICD-10-CM

## 2021-02-26 DIAGNOSIS — I200 Unstable angina: Secondary | ICD-10-CM | POA: Insufficient documentation

## 2021-02-26 DIAGNOSIS — F1190 Opioid use, unspecified, uncomplicated: Secondary | ICD-10-CM

## 2021-02-26 DIAGNOSIS — I739 Peripheral vascular disease, unspecified: Secondary | ICD-10-CM | POA: Diagnosis present

## 2021-02-26 DIAGNOSIS — J432 Centrilobular emphysema: Secondary | ICD-10-CM | POA: Diagnosis present

## 2021-02-26 DIAGNOSIS — I2510 Atherosclerotic heart disease of native coronary artery without angina pectoris: Secondary | ICD-10-CM | POA: Diagnosis present

## 2021-02-26 DIAGNOSIS — Z20822 Contact with and (suspected) exposure to covid-19: Secondary | ICD-10-CM | POA: Diagnosis present

## 2021-02-26 LAB — CBC WITH DIFFERENTIAL/PLATELET
Abs Immature Granulocytes: 0.02 10*3/uL (ref 0.00–0.07)
Basophils Absolute: 0.1 10*3/uL (ref 0.0–0.1)
Basophils Relative: 1 %
Eosinophils Absolute: 0.1 10*3/uL (ref 0.0–0.5)
Eosinophils Relative: 1 %
HCT: 38.2 % — ABNORMAL LOW (ref 39.0–52.0)
Hemoglobin: 12.5 g/dL — ABNORMAL LOW (ref 13.0–17.0)
Immature Granulocytes: 0 %
Lymphocytes Relative: 24 %
Lymphs Abs: 2.3 10*3/uL (ref 0.7–4.0)
MCH: 30.6 pg (ref 26.0–34.0)
MCHC: 32.7 g/dL (ref 30.0–36.0)
MCV: 93.6 fL (ref 80.0–100.0)
Monocytes Absolute: 0.8 10*3/uL (ref 0.1–1.0)
Monocytes Relative: 8 %
Neutro Abs: 6.5 10*3/uL (ref 1.7–7.7)
Neutrophils Relative %: 66 %
Platelets: 194 10*3/uL (ref 150–400)
RBC: 4.08 MIL/uL — ABNORMAL LOW (ref 4.22–5.81)
RDW: 13.7 % (ref 11.5–15.5)
WBC: 9.8 10*3/uL (ref 4.0–10.5)
nRBC: 0 % (ref 0.0–0.2)

## 2021-02-26 LAB — BASIC METABOLIC PANEL
Anion gap: 10 (ref 5–15)
BUN: 10 mg/dL (ref 8–23)
CO2: 28 mmol/L (ref 22–32)
Calcium: 9.4 mg/dL (ref 8.9–10.3)
Chloride: 101 mmol/L (ref 98–111)
Creatinine, Ser: 0.96 mg/dL (ref 0.61–1.24)
GFR, Estimated: >60 mL/min (ref >60)
Glucose, Bld: 120 mg/dL — ABNORMAL HIGH (ref 70–99)
Potassium: 3.4 mmol/L — ABNORMAL LOW (ref 3.5–5.1)
Sodium: 139 mmol/L (ref 135–145)

## 2021-02-26 LAB — TROPONIN I (HIGH SENSITIVITY): Troponin I (High Sensitivity): 32 ng/L — ABNORMAL HIGH (ref <18)

## 2021-02-26 MED ORDER — METHYLPREDNISOLONE SODIUM SUCC 125 MG IJ SOLR
125.0000 mg | Freq: Once | INTRAMUSCULAR | Status: AC
  Administered 2021-02-26: 125 mg via INTRAVENOUS
  Filled 2021-02-26: qty 2

## 2021-02-26 MED ORDER — IPRATROPIUM-ALBUTEROL 0.5-2.5 (3) MG/3ML IN SOLN
3.0000 mL | Freq: Once | RESPIRATORY_TRACT | Status: AC
  Administered 2021-02-26: 3 mL via RESPIRATORY_TRACT
  Filled 2021-02-26: qty 3

## 2021-02-26 NOTE — ED Triage Notes (Signed)
Pt states difficulty breathing 2-3days, pt wears 2-3l at night not typically during the day, but pt states he has been wearing it all day for the past 2 days. Pt has hx copd. Pt was supposed to be seen today to get a stent placed by Dr. Humphrey Rolls, but it had to be rescheduled due to the DR having a out of town emergency.

## 2021-02-26 NOTE — Telephone Encounter (Signed)
PA for Judithann Sauger has been denied. Preferred medication is Symbicort.   Dr. Patsey Berthold, please advise. Thanks

## 2021-02-26 NOTE — Telephone Encounter (Signed)
Recommend UC or ER visit

## 2021-02-26 NOTE — Telephone Encounter (Signed)
Patient is aware of below recommendations and voiced his understanding.  °Nothing further needed at this time.  ° °

## 2021-02-26 NOTE — ED Provider Notes (Signed)
[facility] Emergency Department Provider Note   ____________________________________________   Event Date/Time   First MD Initiated Contact with Patient 02/26/21 2258     (approximate)  I have reviewed the triage vital signs and the nursing notes.   HISTORY  Chief Complaint Shortness of Breath    [name] is a 65 y.o. male who presents to the ED from home with a chief complaint of breathing difficulty.  Patient has a history of COPD who typically wears 2 to 3 L oxygen at night.  Over the course of the last several days, patient has needed supplemental oxygen during the day as well.  He was supposed to get a cardiac stents by his cardiologist Dr. Humphrey Rolls today but it was rescheduled due to the cardiologist having a personal emergency.  Endorses chronic cough, shortness of breath associated with chest tightness.  Denies diaphoresis, fever, abdominal pain, nausea, vomiting, diarrhea or dizziness.  Patient is vaccinated and boosted against COVID-19.     Past Medical History:  Diagnosis Date  . Anxiety   . Arthritis   . 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 Cocaine dependence    Last use 1998, attends 3 NA meetings weekly.  . Compression fracture    L2  . COPD (chronic obstructive pulmonary disease) (HCC)    bronchitis  . CVA (cerebral infarction)   . Disorder of vocal cord   . Fatty liver   . GERD (gastroesophageal reflux disease)   . Hx of cardiovascular stress test    ETT/Lexiscan-Myoview (10/15):  Inferior, inferoapical, apical lateral defect c/w mild ischemia, EF 66%; Intermediate Risk  . Hyperlipidemia   . Hypertension   . Right shoulder pain    2/2 partial rotator cuff tear, tendinopathy, mild subacromial/subdeltioid bursitis, A/C joint arthropathy per MRI done in 3/07  . Tobacco abuse     Patient Active Problem List   Diagnosis Date Noted  . COPD exacerbation ([place]) 02/27/2021  . History of CVA  (cerebrovascular accident) 02/27/2021  . Unstable angina ([place]) 02/19/2021  . Cigarette nicotine dependence without complication 15/40/0867  . Acute lower UTI 02/19/2017  . UTI (urinary tract infection) 02/19/2017  . Left inguinal hernia 08/12/2016  . Elevated troponin 05/08/2016  . Anal fissure 03/15/2016  . External hemorrhoid 03/15/2016  . Acute viral pharyngitis 03/20/2015  . Olecranon bursitis of left elbow 12/23/2014  . Embolic stroke ([place]) 61/95/0932  . Carotid stenosis 10/15/2014  . 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[place]) 09/19/2014  . 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 PAD (peripheral artery disease) ([place]) 08/23/2014  . Preventative health care 08/12/2014  . Occlusion and stenosis of carotid artery without mention of cerebral infarction 07/25/2014  . Subclavian steal syndrome 07/25/2014  . CAD (coronary artery disease) 07/10/2014  . Nocturia 04/17/2014  . Testicular pain, right 04/17/2014  . Urethral stricture 04/17/2014  . CVA (cerebral infarction) 04/10/2014  . 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 Hematuria 08/06/2013  . Compression fracture of L2, with back and right groin pain 07/18/2013  . History of colon polyps 05/18/2012  . COPD (chronic obstructive pulmonary disease) ([place]) 12/24/2009  . Hyperlipemia 09/20/2006  . TOBACCO ABUSE 09/20/2006  . Essential hypertension 09/20/2006  . GERD 09/20/2006    Past Surgical History:  Procedure Laterality Date  . ANTERIOR LAT LUMBAR FUSION N/A 12/18/2013   Procedure: Lumbar Two Anteriorlateral Corpectomy w/ cage, interbody fusion, anterior plating, Lumbar One to Lumbar Three percutaneous pedicle screws;  Surgeon: Mallie Mussel  A Pool, MD;  Location: [place] [facility]UR[facility];  Service: Neurosurgery;  Laterality: N/A;  . AORTA -INNOMIATE BYPASS N/A 10/15/2014   Procedure: ENDARTERCTOMY OF  INNOMINATE ARTERY ;  Surgeon: Elam Dutch, MD;  Location: [place];  Service: Vascular;  Laterality: N/A;  . ARCH  AORTOGRAM N/A 08/23/2014   Procedure: ARCH AORTOGRAM;  Surgeon: Elam Dutch, MD;  Location: [facility] CATH LAB;  Service: Cardiovascular;  Laterality: N/A;  . arch aortogram, left carotid,left subclavian angiogram  08-23-2014  . CAROTID ANGIOGRAM Left 08/23/2014   Procedure: CAROTID ANGIOGRAM;  Surgeon: Elam Dutch, MD;  Location: [facility] CATH LAB;  Service: Cardiovascular;  Laterality: Left;  . CHOLECYSTECTOMY  12/2016  . ENDARTERECTOMY Right 10/15/2014   Procedure: ENDARTERECTOMY RIGHT SUBCLAVIAN ARTERY;  Surgeon: Elam Dutch, MD;  Location: [place];  Service: Vascular;  Laterality: Right;  . ENDARTERECTOMY Right 10/15/2014   Procedure: ENDARTERECTOMY RIGHT COMMON CAROTID ARTERY;  Surgeon: Elam Dutch, MD;  Location: [place];  Service: Vascular;  Laterality: Right;  . 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 LEFT HEART CATHETERIZATION WITH CORONARY ANGIOGRAM N/A 09/12/2014   Procedure: LEFT HEART CATHETERIZATION WITH CORONARY ANGIOGRAM;  Surgeon: Josue Hector, MD;  Location: [facility] CATH LAB;  Service: Cardiovascular;  Laterality: N/A;  . LUMBAR SPINE SURGERY  12/18/2013   L2     DR POOL  . NECK SURGERY    . PATCH ANGIOPLASTY Right 10/15/2014   Procedure: PATCH ANGIOPLASTY OF RIGHT SUBCLAVIAN ARTERY , RIGHT COMMON CAROTID ARTERY & INNOMINATE ARTERY;  Surgeon: Elam Dutch, MD;  Location: [place];  Service: Vascular;  Laterality: Right;  . STERNOTOMY N/A 10/15/2014   Procedure: PARTIAL STERNOTOMY & PLATING OF STERNUM;  Surgeon: Elam Dutch, MD;  Location: [place];  Service: Vascular;  Laterality: N/A;  . TEE WITHOUT CARDIOVERSION N/A 12/25/2013   Procedure: TRANSESOPHAGEAL ECHOCARDIOGRAM (TEE);  Surgeon: Larey Dresser, MD;  Location: [place];  Service: Cardiovascular;  Laterality: N/A;    Prior to Admission medications   Medication Sig Start Date End Date Taking? Authorizing Provider  ADVAIR DISKUS 500-50 MCG/DOSE AEPB USE 1 INHALATION BY MOUTH EVERY 12 HOURS. <<RINSE MOUTH  AFTER USE>>. Patient taking differently: Inhale 1 puff into the lungs 2 (two) times daily. 11/07/20  Yes Tyler Pita, MD  amLODipine (NORVASC) 5 MG tablet Take 5 mg by mouth daily.   Yes [provider]  aspirin EC 81 MG tablet Take 81 mg by mouth daily.   Yes [provider]  atorvastatin (LIPITOR) 80 MG tablet Take 80 mg by mouth daily. 05/17/20  Yes [provider]  Budeson-Glycopyrrol-Formoterol (BREZTRI AEROSPHERE) 160-9-4.8 MCG/ACT AERO Inhale 2 puffs into the lungs in the morning and at bedtime. 02/24/21  Yes Tyler Pita, MD  clopidogrel (PLAVIX) 75 MG tablet Take 75 mg by mouth daily. 02/14/21  Yes [provider]  cyclobenzaprine (FLEXERIL) 10 MG tablet Take 10 mg by mouth daily. 10/29/19  Yes [provider]  FLUoxetine (PROZAC) 10 MG capsule Take 1 capsule (10 mg total) by mouth daily. 04/03/15  Yes Rivet, Sindy Guadeloupe, MD  isosorbide mononitrate (IMDUR) 30 MG 24 hr tablet Take 30 mg by mouth daily.   Yes [provider]  levocetirizine (XYZAL) 5 MG tablet Take 5 mg by mouth at bedtime. 07/15/20  Yes [provider]  linaclotide Rolan Lipa) 290 MCG CAPS capsule Take 290 mcg by mouth daily before breakfast. 12/05/19  Yes [provider]  lisinopril-hydrochlorothiazide ([place]) 20-12.5  MG per tablet TAKE 1 TABLET BY MOUTH DAILY. Patient taking differently: Take 1 tablet by mouth daily. 07/08/15  Yes Rivet, Sindy Guadeloupe, MD  meloxicam (MOBIC) 15 MG tablet TAKE 1 TABLET BY MOUTH EVERY DAY Patient taking differently: Take 15 mg by mouth daily. 09/24/15  Yes Rivet, Sindy Guadeloupe, MD  metFORMIN (GLUCOPHAGE) 500 MG tablet Take 500 mg by mouth daily. 07/22/20  Yes [provider]  montelukast (SINGULAIR) 10 MG tablet Take 10 mg by mouth daily. 07/15/20  Yes [provider]  Multiple Vitamin (MULTI-VITAMINS) TABS Take 1 tablet by mouth daily.   Yes [provider]  oxyCODONE (ROXICODONE) 15 MG  immediate release tablet Take 15 mg by mouth See admin instructions. Six time a day 12/20/15  Yes [provider]  pantoprazole (PROTONIX) 40 MG tablet TAKE 1 TABLET (40 MG TOTAL) BY MOUTH DAILY. Patient taking differently: Take 40 mg by mouth daily. 07/08/15  Yes Rivet, Sindy Guadeloupe, MD  polyethylene glycol (MIRALAX / GLYCOLAX) packet Take 17 g by mouth daily as needed (for constipation).   Yes [provider]  pregabalin (LYRICA) 150 MG capsule Take 150 mg by mouth daily. 09/20/19  Yes [provider]  PROAIR HFA 108 (90 BASE) MCG/ACT inhaler INHALE 2 PUFFS INTO THE LUNGS EVERY 6 (SIX) HOURS AS NEEDED FOR WHEEZING. Patient taking differently: Inhale 2 puffs into the lungs every 6 (six) hours as needed for shortness of breath or wheezing. 05/01/15  Yes Rivet, Sindy Guadeloupe, MD  rOPINIRole (REQUIP) 1 MG tablet Take 1 mg by mouth at bedtime.   Yes [provider]  SPIRIVA HANDIHALER 18 MCG inhalation capsule INHALE 1 CAPSULE AS DIRECTED ONCE DAILY. Patient taking differently: Place 1 capsule into inhaler and inhale daily. 11/07/20  Yes Tyler Pita, MD  tamsulosin (FLOMAX) 0.4 MG CAPS capsule TAKE (1) CAPSULE BY MOUTH ONCE DAILY. Patient taking differently: Take 0.4 mg by mouth daily. 11/07/20  Yes McGowan, Larene Beach A, PA-C  albuterol (PROVENTIL) (2.5 MG/3ML) 0.083% nebulizer solution USE 1 AMPULE IN NEBULIZER EVERY 6 HOURS AS NEEDED. Patient taking differently: Take 2.5 mg by nebulization every 6 (six) hours as needed for wheezing or shortness of breath. 02/19/21   Tyler Pita, MD  EPINEPHrine 0.3 mg/0.3 mL IJ SOAJ injection Inject 0.3 mg into the muscle as needed for anaphylaxis. 04/04/20   [provider]  naloxone [facility]) nasal spray 4 mg/0.1 mL Place 0.4 mg into the nose once. 09/02/16   [provider]  nitroGLYCERIN (NITROSTAT) 0.4 MG SL tablet Place 0.4 mg under the tongue every 5 (five) minutes as needed for chest pain. 12/12/20   [provider]    Allergies Bee venom  Family History  Problem Relation Age of Onset  . Heart disease Father   . Heart attack Father   . Diabetes Paternal Grandmother        1 st degree relatives  . Hypertension Paternal Grandmother   . Diabetes Brother   . Heart disease Brother   . Muscular dystrophy Brother   . Muscular dystrophy Sister   . Prostate cancer Neg Hx   . Kidney cancer Neg Hx   . Bladder Cancer Neg Hx     Social History Social History   Tobacco Use  . Smoking status: Former Smoker    Packs/day: 2.00    Years: 48.00    Pack years: 96.00    Types: Cigarettes    Quit date: 2022    Years since quitting: 0.2  .  Smokeless tobacco: Never Used  Substance Use Topics  . Alcohol use: No    Alcohol/week: 0.0 standard drinks  . Drug use: No    Types: Cocaine, Marijuana    Comment: no recent use    Review of Systems  Constitutional: No fever/chills Eyes: No visual changes. ENT: No sore throat. Cardiovascular: Positive for chest pain. Respiratory: Positive for shortness of breath. Gastrointestinal: No abdominal pain.  No nausea, no vomiting.  No diarrhea.  No constipation. Genitourinary: Negative for dysuria. Musculoskeletal: Negative for back pain. Skin: Negative for rash. Neurological: Negative for headaches, focal weakness or numbness.   ____________________________________________   PHYSICAL EXAM:  VITAL SIGNS: ED Triage Vitals  Enc Vitals Group     BP 02/26/21 2226 (!) 143/118     Pulse Rate 02/26/21 2226 (!) 59     Resp 02/26/21 2226 20     Temp 02/26/21 2225 97.8 F (36.6 C)     Temp Source 02/26/21 2225 Oral     SpO2 02/26/21 2226 94 %     Weight 02/26/21 2222 235 lb (106.6 kg)     Height 02/26/21 2222 5\' 11"  (1.803 m)     Head Circumference --      Peak Flow --      Pain Score 02/26/21 2222 2     Pain Loc --      Pain Edu? --      Excl. in [place]? --     Constitutional: Alert and oriented. Chronically-ill appearing and in mild acute  distress. Eyes: Conjunctivae are normal. PERRL. EOMI. Head: Atraumatic. Nose: No congestion/rhinnorhea. Mouth/Throat: Mucous membranes are moist.   Neck: No stridor.   Cardiovascular: Bradycardic rate, regular rhythm. Grossly normal heart sounds.  Good peripheral circulation. Respiratory: Normal respiratory effort.  No retractions. Lungs diminished bibasilarly. Gastrointestinal: Soft and nontender. No distention. No abdominal bruits. No CVA tenderness. Musculoskeletal: No lower extremity tenderness nor edema.  No joint effusions. Neurologic:  Normal speech and language. No gross focal neurologic deficits are appreciated.  Skin:  Skin is warm, dry and intact. No rash noted. Psychiatric: Mood and affect are normal. Speech and behavior are normal.  ____________________________________________   LABS (all labs ordered are listed, but only abnormal results are displayed)  Labs Reviewed  CBC WITH DIFFERENTIAL/PLATELET - Abnormal; Notable for the following components:      Result Value   RBC 4.08 (*)    Hemoglobin 12.5 (*)    HCT 38.2 (*)    All other components within normal limits  BASIC METABOLIC PANEL - Abnormal; Notable for the following components:   Potassium 3.4 (*)    Glucose, Bld 120 (*)    All other components within normal limits  TROPONIN I (HIGH SENSITIVITY) - Abnormal; Notable for the following components:   Troponin I (High Sensitivity) 32 (*)    All other components within normal limits  TROPONIN I (HIGH SENSITIVITY) - Abnormal; Notable for the following components:   Troponin I (High Sensitivity) 32 (*)    All other components within normal limits  SARS CORONAVIRUS 2 (TAT 6-24 HRS)  BRAIN NATRIURETIC PEPTIDE   ____________________________________________  EKG  ED ECG REPORT I, Leontine Radman J, the attending physician, personally viewed and interpreted this ECG.   Date: 02/26/2021  EKG Time: 2223  Rate: 59  Rhythm: sinus bradycardia  Axis: Normal   Intervals:none  ST&T Change: Nonspecific  ____________________________________________  RADIOLOGY I, Adasyn Mcadams J, personally viewed and evaluated these images (plain radiographs) as part of my medical decision making,  as well as reviewing the written report by the radiologist.  ED MD interpretation: No acute cardiopulmonary process  Official radiology report(s): DG Chest 2 View  Result Date: 02/26/2021 CLINICAL DATA:  Chest pain with difficulty breathing for 3 days. EXAM: CHEST - 2 VIEW COMPARISON:  07/23/2019 FINDINGS: Heart size and pulmonary vascularity are normal. Mild linear changes in the lung bases likely represent vascular crowding with slightly shallow inspiration. No airspace disease or consolidation. No pleural effusions. No pneumothorax. Calcification of the aorta. Degenerative changes in the spine. Postoperative changes in the sternum and lumbar spine. IMPRESSION: No active cardiopulmonary disease. Electronically Signed   By: Lucienne Capers M.D.   On: 02/26/2021 22:51    ____________________________________________   PROCEDURES  Procedure(s) performed (including Critical Care):  .1-3 Lead EKG Interpretation Performed by: Paulette Blanch, MD Authorized by: Paulette Blanch, MD     Interpretation: normal     ECG rate:  59   ECG rate assessment: bradycardic     Rhythm: sinus bradycardia     Ectopy: none     Conduction: normal   Comments:     Patient placed on cardiac monitor to evaluate for arrhythmias     ____________________________________________   INITIAL IMPRESSION / ASSESSMENT AND PLAN / ED COURSE  As part of my medical decision making, I reviewed the following data within the [facility] notes reviewed and incorporated, Labs reviewed, EKG interpreted, Old chart reviewed, Radiograph reviewed and Notes from prior ED visits     65 year old male presenting with shortness of breath Differential includes, but is not limited to, viral  syndrome, bronchitis including COPD exacerbation, pneumonia, reactive airway disease including asthma, CHF including exacerbation with or without pulmonary/interstitial edema, pneumothorax, ACS, thoracic trauma, and pulmonary embolism.  Laboratory results remarkable for mildly elevated troponin; will repeat.  Administer IV Solu-Medrol, DuoNeb and reassess.  I personally reviewed patient's chart and see that he is scheduled for a heart cath 03/17/2021 for unstable angina.  He was recently trialed on Breztri per his pulmonologist which worked well for him but unfortunately insurance has denied his prescription.  Clinical Course as of 02/27/21 0340  Fri Feb 27, 2021  0055 Ambulation trial reveals saturations on patient's oxygen 94 to 95% without overt tachypnea [JS]  0157 Patient tachypneic with audible wheezing.  Does not feel significantly better with prior treatments.  Will administer another DuoNeb and discuss with hospital services for admission. [JS]    Clinical Course User Index [JS] Paulette Blanch, MD     ____________________________________________   FINAL CLINICAL IMPRESSION(S) / ED DIAGNOSES  Final diagnoses:  COPD exacerbation [facility])     ED Discharge Orders    None      *Please note:  Bradley Sawyer was evaluated in Emergency Department on 02/27/2021 for the symptoms described in the history of present illness. He was evaluated in the context of the global COVID-19 pandemic, which necessitated consideration that the patient might be at risk for infection with the SARS-CoV-2 virus that causes COVID-19. Institutional protocols and algorithms that pertain to the evaluation of patients at risk for COVID-19 are in a state of rapid change based on information released by regulatory bodies including the CDC and federal and state organizations. These policies and algorithms were followed during the patient's care in the ED.  Some ED evaluations and interventions may be delayed as a result  of limited staffing during and the pandemic.*   Note:  This document was prepared using  [employer] forces training and education officer and may include unintentional dictation errors.   Paulette Blanch, MD 02/27/21 [phone]

## 2021-02-26 NOTE — Telephone Encounter (Signed)
Called and spoke to patient.  Patient reports of increased sob. Denied fever, chills, sweats or additional sx. He wears 2L qhs. He has had to wear 2L throughout the day for the past 2 days. He does not monitor oxygen levels.  Using albuterol HFA 5-6x daily, albuterol neb daily and breztri BID with no relief in sx.   Dr. Mortimer Fries, please advise. Dr. Patsey Berthold is unavailable.

## 2021-02-26 NOTE — Telephone Encounter (Signed)
PA for Bradley Sawyer has been started via CMM.  [id number]

## 2021-02-27 DIAGNOSIS — Z7984 Long term (current) use of oral hypoglycemic drugs: Secondary | ICD-10-CM | POA: Diagnosis not present

## 2021-02-27 DIAGNOSIS — G8929 Other chronic pain: Secondary | ICD-10-CM

## 2021-02-27 DIAGNOSIS — Z20822 Contact with and (suspected) exposure to covid-19: Secondary | ICD-10-CM | POA: Diagnosis present

## 2021-02-27 DIAGNOSIS — E785 Hyperlipidemia, unspecified: Secondary | ICD-10-CM | POA: Diagnosis present

## 2021-02-27 DIAGNOSIS — Z91030 Bee allergy status: Secondary | ICD-10-CM | POA: Diagnosis not present

## 2021-02-27 DIAGNOSIS — J438 Other emphysema: Secondary | ICD-10-CM | POA: Diagnosis present

## 2021-02-27 DIAGNOSIS — K219 Gastro-esophageal reflux disease without esophagitis: Secondary | ICD-10-CM | POA: Diagnosis present

## 2021-02-27 DIAGNOSIS — R0602 Shortness of breath: Secondary | ICD-10-CM | POA: Diagnosis present

## 2021-02-27 DIAGNOSIS — I739 Peripheral vascular disease, unspecified: Secondary | ICD-10-CM | POA: Diagnosis present

## 2021-02-27 DIAGNOSIS — I10 Essential (primary) hypertension: Secondary | ICD-10-CM | POA: Diagnosis present

## 2021-02-27 DIAGNOSIS — J432 Centrilobular emphysema: Secondary | ICD-10-CM | POA: Diagnosis present

## 2021-02-27 DIAGNOSIS — I2510 Atherosclerotic heart disease of native coronary artery without angina pectoris: Secondary | ICD-10-CM | POA: Diagnosis present

## 2021-02-27 DIAGNOSIS — Z7902 Long term (current) use of antithrombotics/antiplatelets: Secondary | ICD-10-CM | POA: Diagnosis not present

## 2021-02-27 DIAGNOSIS — Z9049 Acquired absence of other specified parts of digestive tract: Secondary | ICD-10-CM | POA: Diagnosis not present

## 2021-02-27 DIAGNOSIS — J9621 Acute and chronic respiratory failure with hypoxia: Secondary | ICD-10-CM | POA: Diagnosis present

## 2021-02-27 DIAGNOSIS — Z791 Long term (current) use of non-steroidal anti-inflammatories (NSAID): Secondary | ICD-10-CM | POA: Diagnosis not present

## 2021-02-27 DIAGNOSIS — R0609 Other forms of dyspnea: Secondary | ICD-10-CM | POA: Diagnosis not present

## 2021-02-27 DIAGNOSIS — F1120 Opioid dependence, uncomplicated: Secondary | ICD-10-CM | POA: Diagnosis present

## 2021-02-27 DIAGNOSIS — J441 Chronic obstructive pulmonary disease with (acute) exacerbation: Secondary | ICD-10-CM | POA: Diagnosis not present

## 2021-02-27 DIAGNOSIS — F1190 Opioid use, unspecified, uncomplicated: Secondary | ICD-10-CM

## 2021-02-27 DIAGNOSIS — Z8673 Personal history of transient ischemic attack (TIA), and cerebral infarction without residual deficits: Secondary | ICD-10-CM | POA: Diagnosis not present

## 2021-02-27 DIAGNOSIS — F419 Anxiety disorder, unspecified: Secondary | ICD-10-CM | POA: Diagnosis present

## 2021-02-27 DIAGNOSIS — M549 Dorsalgia, unspecified: Secondary | ICD-10-CM | POA: Diagnosis present

## 2021-02-27 DIAGNOSIS — Z87891 Personal history of nicotine dependence: Secondary | ICD-10-CM | POA: Diagnosis not present

## 2021-02-27 DIAGNOSIS — Z9981 Dependence on supplemental oxygen: Secondary | ICD-10-CM | POA: Diagnosis not present

## 2021-02-27 DIAGNOSIS — Z8249 Family history of ischemic heart disease and other diseases of the circulatory system: Secondary | ICD-10-CM | POA: Diagnosis not present

## 2021-02-27 DIAGNOSIS — Z7982 Long term (current) use of aspirin: Secondary | ICD-10-CM | POA: Diagnosis not present

## 2021-02-27 DIAGNOSIS — I248 Other forms of acute ischemic heart disease: Secondary | ICD-10-CM | POA: Diagnosis present

## 2021-02-27 LAB — CBC
HCT: 37.5 % — ABNORMAL LOW (ref 39.0–52.0)
Hemoglobin: 12.7 g/dL — ABNORMAL LOW (ref 13.0–17.0)
MCH: 31 pg (ref 26.0–34.0)
MCHC: 33.9 g/dL (ref 30.0–36.0)
MCV: 91.5 fL (ref 80.0–100.0)
Platelets: 194 10*3/uL (ref 150–400)
RBC: 4.1 MIL/uL — ABNORMAL LOW (ref 4.22–5.81)
RDW: 13.4 % (ref 11.5–15.5)
WBC: 11.8 10*3/uL — ABNORMAL HIGH (ref 4.0–10.5)
nRBC: 0 % (ref 0.0–0.2)

## 2021-02-27 LAB — HIV ANTIBODY (ROUTINE TESTING W REFLEX): HIV Screen 4th Generation wRfx: NONREACTIVE

## 2021-02-27 LAB — TROPONIN I (HIGH SENSITIVITY): Troponin I (High Sensitivity): 32 ng/L — ABNORMAL HIGH (ref <18)

## 2021-02-27 LAB — CREATININE, SERUM
Creatinine, Ser: 0.89 mg/dL (ref 0.61–1.24)
GFR, Estimated: >60 mL/min (ref >60)

## 2021-02-27 LAB — SARS CORONAVIRUS 2 (TAT 6-24 HRS): SARS Coronavirus 2: NEGATIVE

## 2021-02-27 LAB — BRAIN NATRIURETIC PEPTIDE: B Natriuretic Peptide: 16.8 pg/mL (ref 0.0–100.0)

## 2021-02-27 MED ORDER — ENOXAPARIN SODIUM 40 MG/0.4ML ~~LOC~~ SOLN: 40.0000 mg | Freq: Two times a day (BID) | SUBCUTANEOUS | Status: DC

## 2021-02-27 MED ORDER — LISINOPRIL-HYDROCHLOROTHIAZIDE 20-12.5 MG PO TABS: 1.0000 | ORAL_TABLET | Freq: Every day | ORAL | Status: DC

## 2021-02-27 MED ORDER — PREGABALIN 75 MG PO CAPS
150.0000 mg | ORAL_CAPSULE | Freq: Every day | ORAL | Status: DC
  Administered 2021-02-27 – 2021-03-03 (×5): 150 mg via ORAL
  Filled 2021-02-27 (×5): qty 2

## 2021-02-27 MED ORDER — LINACLOTIDE 290 MCG PO CAPS
290.0000 ug | ORAL_CAPSULE | Freq: Every day | ORAL | Status: DC
  Administered 2021-02-27 – 2021-03-03 (×5): 290 ug via ORAL
  Filled 2021-02-27 (×6): qty 1

## 2021-02-27 MED ORDER — HYDROCOD POLST-CPM POLST ER 10-8 MG/5ML PO SUER
5.0000 mL | Freq: Once | ORAL | Status: AC
Start: 2021-02-27 — End: 2021-02-27
  Administered 2021-02-27: 5 mL via ORAL
  Filled 2021-02-27: qty 5

## 2021-02-27 MED ORDER — POTASSIUM CHLORIDE CRYS ER 20 MEQ PO TBCR
40.0000 meq | EXTENDED_RELEASE_TABLET | Freq: Once | ORAL | Status: AC
  Administered 2021-02-27: 40 meq via ORAL
  Filled 2021-02-27: qty 2

## 2021-02-27 MED ORDER — IPRATROPIUM-ALBUTEROL 0.5-2.5 (3) MG/3ML IN SOLN
3.0000 mL | RESPIRATORY_TRACT | Status: DC | PRN
  Administered 2021-02-27 – 2021-03-01 (×7): 3 mL via RESPIRATORY_TRACT
  Filled 2021-02-27 (×6): qty 3

## 2021-02-27 MED ORDER — FLUOXETINE HCL 10 MG PO CAPS
10.0000 mg | ORAL_CAPSULE | Freq: Every day | ORAL | Status: DC
  Administered 2021-02-27 – 2021-03-03 (×5): 10 mg via ORAL
  Filled 2021-02-27 (×5): qty 1

## 2021-02-27 MED ORDER — HYDROCHLOROTHIAZIDE 12.5 MG PO CAPS
12.5000 mg | ORAL_CAPSULE | Freq: Every day | ORAL | Status: DC
  Administered 2021-02-27 – 2021-03-03 (×5): 12.5 mg via ORAL
  Filled 2021-02-27 (×5): qty 1

## 2021-02-27 MED ORDER — TIOTROPIUM BROMIDE MONOHYDRATE 18 MCG IN CAPS
1.0000 | ORAL_CAPSULE | Freq: Every day | RESPIRATORY_TRACT | Status: DC
  Filled 2021-02-27: qty 5

## 2021-02-27 MED ORDER — AZITHROMYCIN 250 MG PO TABS
500.0000 mg | ORAL_TABLET | Freq: Every day | ORAL | Status: AC
  Administered 2021-02-28 – 2021-03-03 (×4): 500 mg via ORAL
  Filled 2021-02-27 (×4): qty 2

## 2021-02-27 MED ORDER — PREDNISONE 20 MG PO TABS
40.0000 mg | ORAL_TABLET | Freq: Every day | ORAL | Status: AC
  Administered 2021-02-28 – 2021-03-03 (×4): 40 mg via ORAL
  Filled 2021-02-27 (×4): qty 2

## 2021-02-27 MED ORDER — FUROSEMIDE 10 MG/ML IJ SOLN
20.0000 mg | Freq: Once | INTRAMUSCULAR | Status: AC
  Administered 2021-02-27: 20 mg via INTRAVENOUS
  Filled 2021-02-27: qty 4

## 2021-02-27 MED ORDER — IPRATROPIUM-ALBUTEROL 0.5-2.5 (3) MG/3ML IN SOLN
RESPIRATORY_TRACT | Status: AC
  Filled 2021-02-27: qty 3

## 2021-02-27 MED ORDER — METHYLPREDNISOLONE SODIUM SUCC 40 MG IJ SOLR
40.0000 mg | Freq: Four times a day (QID) | INTRAMUSCULAR | Status: AC
  Administered 2021-02-27 – 2021-02-28 (×4): 40 mg via INTRAVENOUS
  Filled 2021-02-27 (×4): qty 1

## 2021-02-27 MED ORDER — ATORVASTATIN CALCIUM 80 MG PO TABS
80.0000 mg | ORAL_TABLET | Freq: Every day | ORAL | Status: DC
  Administered 2021-02-27 – 2021-03-03 (×5): 80 mg via ORAL
  Filled 2021-02-27 (×2): qty 1
  Filled 2021-02-27: qty 4
  Filled 2021-02-27 (×2): qty 1

## 2021-02-27 MED ORDER — OXYCODONE HCL 5 MG PO TABS
15.0000 mg | ORAL_TABLET | ORAL | Status: DC | PRN
  Administered 2021-02-27 – 2021-03-03 (×26): 15 mg via ORAL
  Filled 2021-02-27 (×26): qty 3

## 2021-02-27 MED ORDER — MELOXICAM 7.5 MG PO TABS
15.0000 mg | ORAL_TABLET | Freq: Every day | ORAL | Status: DC
  Administered 2021-02-27 – 2021-03-03 (×5): 15 mg via ORAL
  Filled 2021-02-27 (×5): qty 2

## 2021-02-27 MED ORDER — CLOPIDOGREL BISULFATE 75 MG PO TABS
75.0000 mg | ORAL_TABLET | Freq: Every day | ORAL | Status: DC
  Administered 2021-02-27 – 2021-03-03 (×5): 75 mg via ORAL
  Filled 2021-02-27 (×5): qty 1

## 2021-02-27 MED ORDER — IPRATROPIUM-ALBUTEROL 0.5-2.5 (3) MG/3ML IN SOLN
3.0000 mL | Freq: Once | RESPIRATORY_TRACT | Status: AC
  Administered 2021-02-27: 3 mL via RESPIRATORY_TRACT
  Filled 2021-02-27: qty 3

## 2021-02-27 MED ORDER — CYCLOBENZAPRINE HCL 10 MG PO TABS
10.0000 mg | ORAL_TABLET | Freq: Every day | ORAL | Status: DC
  Administered 2021-02-27 – 2021-03-03 (×5): 10 mg via ORAL
  Filled 2021-02-27 (×5): qty 1

## 2021-02-27 MED ORDER — ORAL CARE MOUTH RINSE
15.0000 mL | Freq: Two times a day (BID) | OROMUCOSAL | Status: DC
  Administered 2021-02-27 – 2021-03-03 (×8): 15 mL via OROMUCOSAL

## 2021-02-27 MED ORDER — AMLODIPINE BESYLATE 5 MG PO TABS
5.0000 mg | ORAL_TABLET | Freq: Every day | ORAL | Status: DC
  Administered 2021-02-27 – 2021-03-03 (×4): 5 mg via ORAL
  Filled 2021-02-27 (×4): qty 1

## 2021-02-27 MED ORDER — ENSURE ENLIVE PO LIQD
237.0000 mL | Freq: Three times a day (TID) | ORAL | Status: DC
  Administered 2021-02-27 – 2021-03-03 (×11): 237 mL via ORAL

## 2021-02-27 MED ORDER — TAMSULOSIN HCL 0.4 MG PO CAPS
0.4000 mg | ORAL_CAPSULE | Freq: Every day | ORAL | Status: DC
  Administered 2021-02-27 – 2021-03-03 (×5): 0.4 mg via ORAL
  Filled 2021-02-27 (×5): qty 1

## 2021-02-27 MED ORDER — POLYETHYLENE GLYCOL 3350 17 G PO PACK: 17.0000 g | PACK | Freq: Every day | ORAL | Status: DC | PRN

## 2021-02-27 MED ORDER — ADULT MULTIVITAMIN W/MINERALS CH
1.0000 | ORAL_TABLET | Freq: Every day | ORAL | Status: DC
  Administered 2021-02-27 – 2021-03-03 (×5): 1 via ORAL
  Filled 2021-02-27 (×5): qty 1

## 2021-02-27 MED ORDER — ISOSORBIDE MONONITRATE ER 30 MG PO TB24
30.0000 mg | ORAL_TABLET | Freq: Every day | ORAL | Status: DC
  Administered 2021-02-27 – 2021-03-03 (×4): 30 mg via ORAL
  Filled 2021-02-27 (×4): qty 1

## 2021-02-27 MED ORDER — ASPIRIN EC 81 MG PO TBEC
81.0000 mg | DELAYED_RELEASE_TABLET | Freq: Every day | ORAL | Status: DC
  Administered 2021-02-27 – 2021-03-03 (×5): 81 mg via ORAL
  Filled 2021-02-27 (×5): qty 1

## 2021-02-27 MED ORDER — ENOXAPARIN SODIUM 60 MG/0.6ML ~~LOC~~ SOLN
0.5000 mg/kg | SUBCUTANEOUS | Status: DC
  Administered 2021-02-27 – 2021-03-01 (×3): 50 mg via SUBCUTANEOUS
  Filled 2021-02-27 (×7): qty 0.6

## 2021-02-27 MED ORDER — MOMETASONE FURO-FORMOTEROL FUM 200-5 MCG/ACT IN AERO
2.0000 | INHALATION_SPRAY | Freq: Two times a day (BID) | RESPIRATORY_TRACT | Status: DC
  Administered 2021-02-27 – 2021-03-01 (×5): 2 via RESPIRATORY_TRACT
  Filled 2021-02-27: qty 8.8

## 2021-02-27 MED ORDER — SODIUM CHLORIDE 0.9 % IV SOLN
500.0000 mg | INTRAVENOUS | Status: AC
  Administered 2021-02-27: 500 mg via INTRAVENOUS
  Filled 2021-02-27: qty 500

## 2021-02-27 MED ORDER — PANTOPRAZOLE SODIUM 40 MG PO TBEC
40.0000 mg | DELAYED_RELEASE_TABLET | Freq: Every day | ORAL | Status: DC
  Administered 2021-02-27 – 2021-03-03 (×5): 40 mg via ORAL
  Filled 2021-02-27 (×5): qty 1

## 2021-02-27 MED ORDER — LISINOPRIL 20 MG PO TABS
20.0000 mg | ORAL_TABLET | Freq: Every day | ORAL | Status: DC
  Administered 2021-02-27 – 2021-03-03 (×5): 20 mg via ORAL
  Filled 2021-02-27 (×2): qty 1
  Filled 2021-02-27: qty 2
  Filled 2021-02-27 (×2): qty 1

## 2021-02-27 MED ORDER — NITROGLYCERIN 0.4 MG SL SUBL: 0.4000 mg | SUBLINGUAL_TABLET | SUBLINGUAL | Status: DC | PRN

## 2021-02-27 MED ORDER — LORATADINE 10 MG PO TABS
5.0000 mg | ORAL_TABLET | Freq: Every day | ORAL | Status: DC
  Administered 2021-02-27 – 2021-03-02 (×4): 5 mg via ORAL
  Filled 2021-02-27 (×4): qty 1

## 2021-02-27 MED ORDER — ROPINIROLE HCL 1 MG PO TABS
1.0000 mg | ORAL_TABLET | Freq: Every day | ORAL | Status: DC
  Administered 2021-02-27 – 2021-03-02 (×4): 1 mg via ORAL
  Filled 2021-02-27 (×4): qty 1

## 2021-02-27 MED ORDER — MONTELUKAST SODIUM 10 MG PO TABS
10.0000 mg | ORAL_TABLET | Freq: Every day | ORAL | Status: DC
  Administered 2021-02-27 – 2021-03-03 (×5): 10 mg via ORAL
  Filled 2021-02-27 (×5): qty 1

## 2021-02-27 NOTE — Progress Notes (Signed)
Patient admitted to room 243 from ED via stretcher. Denies CP/SOB, VSS. Denies any falls within the last 90 days. Oriented to nursing team and room. Call bell within reach.

## 2021-02-27 NOTE — H&P (Signed)
History and Physical    Bradley Sawyer TOI:[id number] DOB: [birth year] DOA: 02/26/2021  PCP: Curly Rim, MD   Patient coming from: Home  I have personally briefly reviewed patient's old medical records in [place]  Chief Complaint: Shortness of breath  HPI: Bradley Sawyer is a 65 y.o. male with medical history significant for COPD on nighttime O2 at 2-3 L, former heavy smoker, chronic back pain on chronic high dose opioids, CAD, history of CVA, and who was scheduled for elective cardiac cath, postponed due to emergency with his cardiologist who presents to the emergency room with a 3-day history of increased shortness of breath with need to use O2 throughout the day instead of just at night.  He has a chronic cough and denies fever or chills.  Denies nausea vomiting or diaphoresis. Does not feel like he is wheezing more. ED course: On arrival, BP 143/118 with pulse of 59, respirations 20 with O2 sat 94% on 3 L.  Afebrile at 97.8.  Troponin flat at 32/32.  BNP normal at 16.  BMP and CBC unremarkable. EKG, reviewed and interpreted myself: Sinus bradycardia at 59 with nonspecific ST-T wave changes Chest x-ray: No active cardiopulmonary disease  Patient was treated in the ER with duo nebs and methylprednisolone and also got a small dose of Lasix 20 mg but continued to have increased work of breathing.  Hospitalist consulted for admission for COPD exacerbation.  Review of Systems: As per HPI otherwise all other systems on review of systems negative.    Past Medical History:  Diagnosis Date  . Anxiety   . Arthritis   . 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 Cocaine dependence    Last use 1998, attends 3 NA meetings weekly.  . Compression fracture    L2  . COPD (chronic obstructive pulmonary disease) (HCC)    bronchitis  . CVA (cerebral infarction)   . Disorder of vocal cord   . Fatty liver   . GERD (gastroesophageal reflux disease)   . Hx of  cardiovascular stress test    ETT/Lexiscan-Myoview (10/15):  Inferior, inferoapical, apical lateral defect c/w mild ischemia, EF 66%; Intermediate Risk  . Hyperlipidemia   . Hypertension   . Right shoulder pain    2/2 partial rotator cuff tear, tendinopathy, mild subacromial/subdeltioid bursitis, A/C joint arthropathy per MRI done in 3/07  . Tobacco abuse     Past Surgical History:  Procedure Laterality Date  . ANTERIOR LAT LUMBAR FUSION N/A 12/18/2013   Procedure: Lumbar Two Anteriorlateral Corpectomy w/ cage, interbody fusion, anterior plating, Lumbar One to Lumbar Three percutaneous pedicle screws;  Surgeon: Charlie Pitter, MD;  Location: [place] [facility];  Service: Neurosurgery;  Laterality: N/A;  . AORTA -INNOMIATE BYPASS N/A 10/15/2014   Procedure: ENDARTERCTOMY OF  INNOMINATE ARTERY ;  Surgeon: Elam Dutch, MD;  Location: [place];  Service: Vascular;  Laterality: N/A;  . ARCH AORTOGRAM N/A 08/23/2014   Procedure: ARCH AORTOGRAM;  Surgeon: Elam Dutch, MD;  Location: [facility] CATH LAB;  Service: Cardiovascular;  Laterality: N/A;  . arch aortogram, left carotid,left subclavian angiogram  08-23-2014  . CAROTID ANGIOGRAM Left 08/23/2014   Procedure: CAROTID ANGIOGRAM;  Surgeon: Elam Dutch, MD;  Location: [facility] CATH LAB;  Service: Cardiovascular;  Laterality: Left;  . CHOLECYSTECTOMY  12/2016  . ENDARTERECTOMY Right 10/15/2014   Procedure: ENDARTERECTOMY RIGHT SUBCLAVIAN ARTERY;  Surgeon: Elam Dutch, MD;  Location: [place];  Service: Vascular;  Laterality: Right;  .  ENDARTERECTOMY Right 10/15/2014   Procedure: ENDARTERECTOMY RIGHT COMMON CAROTID ARTERY;  Surgeon: Elam Dutch, MD;  Location: [place];  Service: Vascular;  Laterality: Right;  . 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 LEFT HEART CATHETERIZATION WITH CORONARY ANGIOGRAM N/A 09/12/2014   Procedure: LEFT HEART CATHETERIZATION WITH CORONARY ANGIOGRAM;  Surgeon: Josue Hector, MD;  Location: [facility] CATH LAB;  Service:  Cardiovascular;  Laterality: N/A;  . LUMBAR SPINE SURGERY  12/18/2013   L2     DR POOL  . NECK SURGERY    . PATCH ANGIOPLASTY Right 10/15/2014   Procedure: PATCH ANGIOPLASTY OF RIGHT SUBCLAVIAN ARTERY , RIGHT COMMON CAROTID ARTERY & INNOMINATE ARTERY;  Surgeon: Elam Dutch, MD;  Location: [place];  Service: Vascular;  Laterality: Right;  . STERNOTOMY N/A 10/15/2014   Procedure: PARTIAL STERNOTOMY & PLATING OF STERNUM;  Surgeon: Elam Dutch, MD;  Location: [place];  Service: Vascular;  Laterality: N/A;  . TEE WITHOUT CARDIOVERSION N/A 12/25/2013   Procedure: TRANSESOPHAGEAL ECHOCARDIOGRAM (TEE);  Surgeon: Larey Dresser, MD;  Location: [facility] ENDOSCOPY;  Service: Cardiovascular;  Laterality: N/A;     reports that he quit smoking about 3 months ago. His smoking use included cigarettes. He has a 96.00 pack-year smoking history. He has never used smokeless tobacco. He reports that he does not drink alcohol and does not use drugs.  Allergies  Allergen Reactions  . Bee Venom Swelling    Family History  Problem Relation Age of Onset  . Heart disease Father   . Heart attack Father   . Diabetes Paternal Grandmother        1 st degree relatives  . Hypertension Paternal Grandmother   . Diabetes Brother   . Heart disease Brother   . Muscular dystrophy Brother   . Muscular dystrophy Sister   . Prostate cancer Neg Hx   . Kidney cancer Neg Hx   . Bladder Cancer Neg Hx       Prior to Admission medications   Medication Sig Start Date End Date Taking? Authorizing Provider  ADVAIR DISKUS 500-50 MCG/DOSE AEPB USE 1 INHALATION BY MOUTH EVERY 12 HOURS. <<RINSE MOUTH AFTER USE>>. Patient taking differently: Inhale 1 puff into the lungs 2 (two) times daily. 11/07/20  Yes Tyler Pita, MD  amLODipine (NORVASC) 5 MG tablet Take 5 mg by mouth daily.   Yes [provider]  aspirin EC 81 MG tablet Take 81 mg by mouth daily.   Yes [provider]  atorvastatin (LIPITOR) 80 MG  tablet Take 80 mg by mouth daily. 05/17/20  Yes [provider]  Budeson-Glycopyrrol-Formoterol (BREZTRI AEROSPHERE) 160-9-4.8 MCG/ACT AERO Inhale 2 puffs into the lungs in the morning and at bedtime. 02/24/21  Yes Tyler Pita, MD  clopidogrel (PLAVIX) 75 MG tablet Take 75 mg by mouth daily. 02/14/21  Yes [provider]  cyclobenzaprine (FLEXERIL) 10 MG tablet Take 10 mg by mouth daily. 10/29/19  Yes [provider]  FLUoxetine (PROZAC) 10 MG capsule Take 1 capsule (10 mg total) by mouth daily. 04/03/15  Yes Rivet, Sindy Guadeloupe, MD  isosorbide mononitrate (IMDUR) 30 MG 24 hr tablet Take 30 mg by mouth daily.   Yes [provider]  levocetirizine (XYZAL) 5 MG tablet Take 5 mg by mouth at bedtime. 07/15/20  Yes [provider]  linaclotide Rolan Lipa) 290 MCG CAPS capsule Take 290 mcg by mouth daily before breakfast. 12/05/19  Yes [provider]  lisinopril-hydrochlorothiazide (PRINZIDE,ZESTORETIC) 20-12.5 MG per  tablet TAKE 1 TABLET BY MOUTH DAILY. Patient taking differently: Take 1 tablet by mouth daily. 07/08/15  Yes Rivet, Sindy Guadeloupe, MD  meloxicam (MOBIC) 15 MG tablet TAKE 1 TABLET BY MOUTH EVERY DAY Patient taking differently: Take 15 mg by mouth daily. 09/24/15  Yes Rivet, Sindy Guadeloupe, MD  metFORMIN (GLUCOPHAGE) 500 MG tablet Take 500 mg by mouth daily. 07/22/20  Yes [provider]  montelukast (SINGULAIR) 10 MG tablet Take 10 mg by mouth daily. 07/15/20  Yes [provider]  Multiple Vitamin (MULTI-VITAMINS) TABS Take 1 tablet by mouth daily.   Yes [provider]  oxyCODONE (ROXICODONE) 15 MG immediate release tablet Take 15 mg by mouth See admin instructions. Six time a day 12/20/15  Yes [provider]  pantoprazole (PROTONIX) 40 MG tablet TAKE 1 TABLET (40 MG TOTAL) BY MOUTH DAILY. Patient taking differently: Take 40 mg by mouth daily. 07/08/15  Yes Rivet, Sindy Guadeloupe, MD  polyethylene glycol (MIRALAX / GLYCOLAX) packet  Take 17 g by mouth daily as needed (for constipation).   Yes [provider]  pregabalin (LYRICA) 150 MG capsule Take 150 mg by mouth daily. 09/20/19  Yes [provider]  PROAIR HFA 108 (90 BASE) MCG/ACT inhaler INHALE 2 PUFFS INTO THE LUNGS EVERY 6 (SIX) HOURS AS NEEDED FOR WHEEZING. Patient taking differently: Inhale 2 puffs into the lungs every 6 (six) hours as needed for shortness of breath or wheezing. 05/01/15  Yes Rivet, Sindy Guadeloupe, MD  rOPINIRole (REQUIP) 1 MG tablet Take 1 mg by mouth at bedtime.   Yes [provider]  SPIRIVA HANDIHALER 18 MCG inhalation capsule INHALE 1 CAPSULE AS DIRECTED ONCE DAILY. Patient taking differently: Place 1 capsule into inhaler and inhale daily. 11/07/20  Yes Tyler Pita, MD  tamsulosin (FLOMAX) 0.4 MG CAPS capsule TAKE (1) CAPSULE BY MOUTH ONCE DAILY. Patient taking differently: Take 0.4 mg by mouth daily. 11/07/20  Yes McGowan, Larene Beach A, PA-C  albuterol (PROVENTIL) (2.5 MG/3ML) 0.083% nebulizer solution USE 1 AMPULE IN NEBULIZER EVERY 6 HOURS AS NEEDED. Patient taking differently: Take 2.5 mg by nebulization every 6 (six) hours as needed for wheezing or shortness of breath. 02/19/21   Tyler Pita, MD  EPINEPHrine 0.3 mg/0.3 mL IJ SOAJ injection Inject 0.3 mg into the muscle as needed for anaphylaxis. 04/04/20   [provider]  naloxone [facility]) nasal spray 4 mg/0.1 mL Place 0.4 mg into the nose once. 09/02/16   [provider]  nitroGLYCERIN (NITROSTAT) 0.4 MG SL tablet Place 0.4 mg under the tongue every 5 (five) minutes as needed for chest pain. 12/12/20   [provider]    Physical Exam: Vitals:   02/26/21 2300 02/26/21 2330 02/27/21 0036 02/27/21 0200  BP: 129/66 (!) 158/79 (!) 168/86 (!) 143/82  Pulse: (!) 49 (!) 54 78 (!) 55  Resp: 15 18 16 15   Temp:      TempSrc:      SpO2: 95% 94% 96% 93%  Weight:      Height:         Vitals:   02/26/21 2300 02/26/21 2330 02/27/21 0036  02/27/21 0200  BP: 129/66 (!) 158/79 (!) 168/86 (!) 143/82  Pulse: (!) 49 (!) 54 78 (!) 55  Resp: 15 18 16 15   Temp:      TempSrc:      SpO2: 95% 94% 96% 93%  Weight:      Height:          Constitutional: Alert  and oriented x 3 . Not in any apparent distress HEENT:      Head: Normocephalic and atraumatic.         Eyes: PERLA, EOMI, Conjunctivae are normal. Sclera is non-icteric.       Mouth/Throat: Mucous membranes are moist.       Neck: Supple with no signs of meningismus. Cardiovascular:  Bradycardia. No murmurs, gallops, or rubs. 2+ symmetrical distal pulses are present . No JVD. No LE edema Respiratory: Respiratory effort normal .Lungs sounds diminished bilaterally. No wheezes, crackles, or rhonchi.  Gastrointestinal: Soft, non tender, and non distended with positive bowel sounds.  Genitourinary: No CVA tenderness. Musculoskeletal: Nontender with normal range of motion in all extremities. No cyanosis, or erythema of extremities. Neurologic:  Face is symmetric. Moving all extremities. No gross focal neurologic deficits . Skin: Skin is warm, dry.  No rash or ulcers Psychiatric: Mood and affect are normal    Labs on Admission: I have personally reviewed following labs and imaging studies  CBC: Recent Labs  Lab 02/26/21 2225  WBC 9.8  NEUTROABS 6.5  HGB 12.5*  HCT 38.2*  MCV 93.6  PLT 709   Basic Metabolic Panel: Recent Labs  Lab 02/26/21 2225  NA 139  K 3.4*  CL 101  CO2 28  GLUCOSE 120*  BUN 10  CREATININE 0.96  CALCIUM 9.4   GFR: Estimated Creatinine Clearance: 96.5 mL/min (by C-G formula based on SCr of 0.96 mg/dL). Liver Function Tests: No results for input(s): AST, ALT, ALKPHOS, BILITOT, PROT, ALBUMIN in the last 168 hours. No results for input(s): LIPASE, AMYLASE in the last 168 hours. No results for input(s): AMMONIA in the last 168 hours. Coagulation Profile: No results for input(s): INR, PROTIME in the last 168 hours. Cardiac Enzymes: No  results for input(s): CKTOTAL, CKMB, CKMBINDEX, TROPONINI in the last 168 hours. BNP (last 3 results) No results for input(s): PROBNP in the last 8760 hours. HbA1C: No results for input(s): HGBA1C in the last 72 hours. CBG: No results for input(s): GLUCAP in the last 168 hours. Lipid Profile: No results for input(s): CHOL, HDL, LDLCALC, TRIG, CHOLHDL, LDLDIRECT in the last 72 hours. Thyroid Function Tests: No results for input(s): TSH, T4TOTAL, FREET4, T3FREE, THYROIDAB in the last 72 hours. Anemia Panel: No results for input(s): VITAMINB12, FOLATE, FERRITIN, TIBC, IRON, RETICCTPCT in the last 72 hours. Urine analysis:    Component Value Date/Time   COLORURINE YELLOW (A) 02/19/2017 1459   APPEARANCEUR Cloudy (A) 01/19/2021 1120   LABSPEC 1.016 02/19/2017 1459   LABSPEC 1.014 06/27/2013 2105   PHURINE 5.0 02/19/2017 1459   GLUCOSEU Negative 01/19/2021 1120   GLUCOSEU Negative 06/27/2013 2105   GLUCOSEU NEG mg/dL 01/31/2009 2108   HGBUR MODERATE (A) 02/19/2017 1459   BILIRUBINUR Negative 01/19/2021 1120   BILIRUBINUR Negative 06/27/2013 2105   KETONESUR NEGATIVE 02/19/2017 1459   PROTEINUR Negative 01/19/2021 1120   PROTEINUR NEGATIVE 02/19/2017 1459   UROBILINOGEN 1.0 10/19/2014 1504   NITRITE Negative 01/19/2021 1120   NITRITE NEGATIVE 02/19/2017 1459   LEUKOCYTESUR Trace (A) 01/19/2021 1120   LEUKOCYTESUR Trace 06/27/2013 2105    Radiological Exams on Admission: DG Chest 2 View  Result Date: 02/26/2021 CLINICAL DATA:  Chest pain with difficulty breathing for 3 days. EXAM: CHEST - 2 VIEW COMPARISON:  07/23/2019 FINDINGS: Heart size and pulmonary vascularity are normal. Mild linear changes in the lung bases likely represent vascular crowding with slightly shallow inspiration. No airspace disease or consolidation. No pleural effusions. No pneumothorax. Calcification  of the aorta. Degenerative changes in the spine. Postoperative changes in the sternum and lumbar spine. IMPRESSION:  No active cardiopulmonary disease. Electronically Signed   By: Lucienne Capers M.D.   On: 02/26/2021 22:51     Assessment/Plan 64 year old male with history of COPD on nighttime O2 at 2-3 L, former heavy smoker, CAD, history of CVA, who presents with a 3-day history of increased shortness of breath i with need to use O2 throughout the day instead of just at night.      COPD exacerbation ([place]) -Patient presenting with wheezing, unrelieved with home bronchodilators and with increased O2 requirement from baseline of nighttime O2 -Chest x-ray with no acute disease.  BNP normal -Duo nebs scheduled and as needed -Supplemental oxygen to keep sats over 90 -Continue home inhalers -Patient follows outpatient with pulmonology    Elevated troponin   CAD (coronary artery disease) -Troponin elevated at 32 and flat, likely demand ischemia -Patient was scheduled to have elective cardiac cath on the day of arrival which was postponed due to personal emergency of his cardiologist -ACS not suspected at this time -Continue aspirin, atorvastatin, clopidogrel, Imdur and NTG    Essential hypertension -Continue lisinopril HCTZ    PAD (peripheral artery disease) ([place])   History of CVA (cerebrovascular accident) -Continue antiplatelets and statins  Chronic Back pain Chronic opioid in therapeutic use --Patient on oxycodone IR 15mg  6x/day    DVT prophylaxis: Lovenox  Code Status: full code  Family Communication:  none  Disposition Plan: Back to previous home environment Consults called: none  Status:At the time of admission, it appears that the appropriate admission status for this patient is INPATIENT. This is judged to be reasonable and necessary in order to provide the required intensity of service to ensure the patient's safety given the presenting symptoms, physical exam findings, and initial radiographic and laboratory data in the context of their  Comorbid conditions.   Patient requires inpatient  status due to high intensity of service, high risk for further deterioration and high frequency of surveillance required.   I certify that at the point of admission it is my clinical judgment that the patient will require inpatient hospital care spanning beyond [place] MD [facility]     02/27/2021, 2:52 AM

## 2021-02-27 NOTE — Progress Notes (Signed)
PROGRESS NOTE    Bradley Sawyer  WJX:[id number] DOB: 05/08/1956 DOA: 02/26/2021 PCP: Curly Rim, MD    Brief Narrative:  Bradley Sawyer is a 65 year old male with past medical history significant for COPD on home O2 2-3 L nightly, former heavy tobacco use, chronic low back pain on chronic high-dose opioids, CAD, history of CVA, who presented to the emergency department on 02/26/2021 with 3-day history of progressive shortness of breath.  Patient reports has been utilizing his O2 throughout the day in addition to his normal nightly use.  He has a chronic cough but denies fever/chills.  No nausea/vomiting or diaphoresis.  In the ED, temperature 97.8 F, HR 59, RR 20, BP 143/118, SPO2 94% on 2 L nasal cannula.  Sodium 139, potassium 3.4, chloride 101, CO2 28, glucose 120, BUN 10, creatinine 0.96.  BNP 16.8, high sensitive troponin 32>32.  WBC 9.8, hemoglobin 12.5, platelets 194.  Chest x-ray with no acute cardiopulmonary disease process.  Patient was started on duo nebs, Solu-Medrol and IV furosemide 20 mg by ED provider.  Hospitalist service consulted for further evaluation and management of acute hypoxic respiratory failure secondary to COPD exacerbation.   Assessment & Plan:   Principal Problem:   COPD exacerbation ([place]) Active Problems:   Essential hypertension   CAD (coronary artery disease)   PAD (peripheral artery disease) (HCC)   Elevated troponin   History of CVA (cerebrovascular accident)   Chronic pain, radicular   Chronic, continuous use of opioids   Acute hypoxic respiratory failure, POA COPD exacerbation Patient presenting to the ED with progressive shortness of breath over the past 3 days and requiring around-the-clock supplemental oxygen (usually only on qHS).  Patient is afebrile without leukocytosis.  Chest x-ray with no acute cardiopulmonary disease process.  Patient follows with pulmonology outpatient, Dr. Mortimer Fries.  Patient reports compliance with his home  medication regimen. --Azithromycin 500 mg p.o. daily x5 days --Solu-Medrol 40 mg IV every 6 hours x1 day followed by prednisone 40 mg p.o. daily to complete 5-day course --DuoNeb every 4 hours as needed wheezing/shortness of breath --Continue supplemental oxygen, maintain SPO2 greater than 88%.  Currently on 2.5L nasal cannula with SPO2 97% (on 2-3L qHS at baseline) --Ambulatory O2 screening in a.m.  Elevated troponin CAD Troponin was slightly elevated at 32, followed by 32; flat.  Likely secondary to demand ischemia from exacerbation of his underlying COPD disease.  Patient follows with cardiology outpatient.  Scheduled to have elective cardiac catheterization upcoming. --Continue aspirin, atorvastatin, clopidogrel, Imdur  --Outpatient follow-up with cardiology  Essential hypertension BP 133/80, well controlled. --Continue amlodipine 5 mg p.o. daily, lisinopril 20 mg p.o. daily, HCTZ 12.5 mg p.o. daily  PAD History of CVA --Continue Plavix, aspirin and statin  HLD: Atorvastatin 80 mg p.o. daily  Chronic back pain Chronic opioid dependence --Meloxicam 15 mg p.o. daily --Flexeril 10 mg p.o. daily --Continue oxycodone IR 15 mg 6x daily --Outpatient follow-up with pain management  DVT prophylaxis: Lovenox   Code Status: Prior Family Communication: No family present at bedside this morning  Disposition Plan:  Level of care: Progressive Cardiac Status is: Inpatient  Remains inpatient appropriate because:Ongoing diagnostic testing needed not appropriate for outpatient work up, Unsafe d/c plan, IV treatments appropriate due to intensity of illness or inability to take PO and Inpatient level of care appropriate due to severity of illness   Dispo: The patient is from: Home              Anticipated d/c  is to: Home              Patient currently is not medically stable to d/c.   Difficult to place patient No   Consultants:   None  Procedures:   None  Antimicrobials:    Azithromycin 4/8>>   Subjective: Patient seen and examined at bedside, resting comfortably.  Continues in ED holding area.  Continues to complain of shortness of breath, although oxygenation is greater than 95% on 2.5 L nasal cannula.  Awaiting for room upstairs.  Complaining of back pain with the ED gurney.  No other complaints or concerns at this time.  Denies headache, no fever/chills/night sweats, no nausea/vomiting/diarrhea, no chest pain, no palpitations, no abdominal pain, no weakness, no fatigue, no paresthesias.  No acute events overnight per nursing staff.  Objective: Vitals:   02/27/21 0930 02/27/21 1100 02/27/21 1230 02/27/21 1320  BP: (!) 148/82 129/67 (!) 136/49 140/68  Pulse: 74 89 80 69  Resp: (!) 22 17 18 20   Temp:   97.8 F (36.6 C) (!) 97.5 F (36.4 C)  TempSrc:   Oral Oral  SpO2: 97% 91% 93% 94%  Weight:    101.9 kg  Height:    5\' 11"  (1.803 m)    Intake/Output Summary (Last 24 hours) at 02/27/2021 1337 Last data filed at 02/27/2021 1610 Gross per 24 hour  Intake 249.75 ml  Output 1450 ml  Net -1200.25 ml   Filed Weights   02/26/21 2222 02/27/21 1320  Weight: 106.6 kg 101.9 kg    Examination:  General exam: Appears calm and comfortable, appears older than stated age Respiratory system: Decreased breath sounds bilateral bases with mild wheezing throughout, normal respiratory effort without accessory muscle use, on 2.5 L nasal cannula with SPO2 97% (baseline 2-L [place] qHS) Cardiovascular system: S1 & S2 heard, RRR. No JVD, murmurs, rubs, gallops or clicks. No pedal edema. Gastrointestinal system: Abdomen is nondistended, soft and nontender. No organomegaly or masses felt. Normal bowel sounds heard. Central nervous system: Alert and oriented. No focal neurological deficits. Extremities: Symmetric 5 x 5 power. Skin: No rashes, lesions or ulcers Psychiatry: Judgement and insight appear normal. Mood & affect appropriate.     Data Reviewed: I have personally  reviewed following labs and imaging studies  CBC: Recent Labs  Lab 02/26/21 2225  WBC 9.8  NEUTROABS 6.5  HGB 12.5*  HCT 38.2*  MCV 93.6  PLT 960   Basic Metabolic Panel: Recent Labs  Lab 02/26/21 2225  NA 139  K 3.4*  CL 101  CO2 28  GLUCOSE 120*  BUN 10  CREATININE 0.96  CALCIUM 9.4   GFR: Estimated Creatinine Clearance: 94.5 mL/min (by C-G formula based on SCr of 0.96 mg/dL). Liver Function Tests: No results for input(s): AST, ALT, ALKPHOS, BILITOT, PROT, ALBUMIN in the last 168 hours. No results for input(s): LIPASE, AMYLASE in the last 168 hours. No results for input(s): AMMONIA in the last 168 hours. Coagulation Profile: No results for input(s): INR, PROTIME in the last 168 hours. Cardiac Enzymes: No results for input(s): CKTOTAL, CKMB, CKMBINDEX, TROPONINI in the last 168 hours. BNP (last 3 results) No results for input(s): PROBNP in the last 8760 hours. HbA1C: No results for input(s): HGBA1C in the last 72 hours. CBG: No results for input(s): GLUCAP in the last 168 hours. Lipid Profile: No results for input(s): CHOL, HDL, LDLCALC, TRIG, CHOLHDL, LDLDIRECT in the last 72 hours. Thyroid Function Tests: No results for input(s): TSH, T4TOTAL, FREET4, T3FREE, THYROIDAB  in the last 72 hours. Anemia Panel: No results for input(s): VITAMINB12, FOLATE, FERRITIN, TIBC, IRON, RETICCTPCT in the last 72 hours. Sepsis Labs: No results for input(s): PROCALCITON, LATICACIDVEN in the last 168 hours.  No results found for this or any previous visit (from the past 240 hour(s)).       Radiology Studies: DG Chest 2 View  Result Date: 02/26/2021 CLINICAL DATA:  Chest pain with difficulty breathing for 3 days. EXAM: CHEST - 2 VIEW COMPARISON:  07/23/2019 FINDINGS: Heart size and pulmonary vascularity are normal. Mild linear changes in the lung bases likely represent vascular crowding with slightly shallow inspiration. No airspace disease or consolidation. No pleural  effusions. No pneumothorax. Calcification of the aorta. Degenerative changes in the spine. Postoperative changes in the sternum and lumbar spine. IMPRESSION: No active cardiopulmonary disease. Electronically Signed   By: Lucienne Capers M.D.   On: 02/26/2021 22:51        Scheduled Meds: . amLODipine  5 mg Oral Daily  . aspirin EC  81 mg Oral Daily  . atorvastatin  80 mg Oral Daily  . [START ON 02/28/2021] azithromycin  500 mg Oral Daily  . clopidogrel  75 mg Oral Daily  . cyclobenzaprine  10 mg Oral Daily  . enoxaparin (LOVENOX) injection  40 mg Subcutaneous Q24H  . FLUoxetine  10 mg Oral Daily  . lisinopril  20 mg Oral Daily   And  . hydrochlorothiazide  12.5 mg Oral Daily  . isosorbide mononitrate  30 mg Oral Daily  . linaclotide  290 mcg Oral QAC breakfast  . loratadine  5 mg Oral QHS  . meloxicam  15 mg Oral Daily  . methylPREDNISolone (SOLU-MEDROL) injection  40 mg Intravenous Q6H   Followed by  . [START ON 02/28/2021] predniSONE  40 mg Oral Q breakfast  . mometasone-formoterol  2 puff Inhalation BID  . montelukast  10 mg Oral Daily  . multivitamin with minerals  1 tablet Oral Daily  . pantoprazole  40 mg Oral Daily  . pregabalin  150 mg Oral Daily  . rOPINIRole  1 mg Oral QHS  . tamsulosin  0.4 mg Oral Daily  . tiotropium  1 capsule Inhalation Daily   Continuous Infusions:   LOS: 0 days    Time spent: 39 minutes spent on chart review, discussion with nursing staff, consultants, updating family and interview/physical exam; more than 50% of that time was spent in counseling and/or coordination of care.    Machael Raine J [place] DO [facility] Available via Epic secure chat 7am-7pm After these hours, please refer to coverage provider listed on [email or web address] 02/27/2021, 1:37 PM

## 2021-02-27 NOTE — Evaluation (Signed)
Physical Therapy Evaluation Patient Details Name: Bradley Sawyer MRN: [id number] DOB: 22-Apr-1956 Today's Date: 02/27/2021   History of Present Illness  Rolla Kedzierski is a 54yoM who [facility] after worsening of DOE/SOB and CP, admitted with COPD exacerbation. PMH: endarterectomy x3, partial sternotomy and sternal plating, COPD on home O2 2-3 L nightly, former heavy tobacco use, chronic low back pain on chronic high-dose opioids, CAD, history of CVA.  Clinical Impression  Pt admitted with above diagnosis. Pt currently with functional limitations due to the deficits listed below (see "PT Problem List"). Patient agreeable to PT evaluation. Patient provides detailed description of PLOF and home environment. Pt denies any acute changes in strength, balance, or confidence with ability to perform basic mobility in the home. Patient is at baseline, all education completed, and time is given to address all questions/concerns. No additional skilled PT services needed at this time, PT signing off. PT recommends daily ambulation ad lib or with nursing staff as needed to prevent deconditioning.      Follow Up Recommendations No PT follow up    Equipment Recommendations  None recommended by PT    Recommendations for Other Services       Precautions / Restrictions Precautions Precautions: None Restrictions Weight Bearing Restrictions: No      Mobility  Bed Mobility Overal bed mobility: Independent                  Transfers Overall transfer level: Independent                  Ambulation/Gait Ambulation/Gait assistance: Independent Gait Distance (Feet): 150 Feet Assistive device: None Gait Pattern/deviations: WFL(Within Functional Limits)     General Gait Details: post AMB 89% on ROM, appears to be in no acute distress.  Stairs            Wheelchair Mobility    Modified Rankin (Stroke Patients Only)       Balance Overall balance assessment:  Independent;No apparent balance deficits (not formally assessed) (denies a recent falls history)                                           Pertinent Vitals/Pain Pain Assessment: No/denies pain (chronic pain patient, has baseline pain at this time, no new pain, no exacerbation.)    Home Living Family/patient expects to be discharged to:: Private residence Living Arrangements: Children;Other relatives (sonandDIL,bothworkduringtheday.) Available Help at Discharge: Family Type of Home: Mobile home Home Access: Stairs to enter Entrance Stairs-Rails: Right   Home Layout: One level Home Equipment: Cane - single point Additional Comments: concentratoron 2.5L overnight; canpaceselfin public    Prior Function Level of Independence: Independent         Comments: mostlyhouseholddistanceAMB,tolerateslimitedhouseholdaMBadlib,nofallsin63months     Hand Dominance        Extremity/Trunk Assessment   Upper Extremity Assessment Upper Extremity Assessment: Overall WFL for tasks assessed    Lower Extremity Assessment Lower Extremity Assessment: Overall WFL for tasks assessed       Communication      Cognition Arousal/Alertness: Awake/alert Behavior During Therapy: WFL for tasks assessed/performed Overall Cognitive Status: Within Functional Limits for tasks assessed  General Comments      Exercises     Assessment/Plan    PT Assessment Patent does not need any further PT services  PT Problem List Decreased activity tolerance       PT Treatment Interventions      PT Goals (Current goals can be found in the Care Plan section)  Acute Rehab PT Goals PT Goal Formulation: All assessment and education complete, DC therapy Time For Goal Achievement: 03/13/21    Frequency     Barriers to discharge        Co-evaluation               AM-PAC PT "6 Clicks" Mobility  Outcome Measure Help needed  turning from your back to your side while in a flat bed without using bedrails?: None Help needed moving from lying on your back to sitting on the side of a flat bed without using bedrails?: None Help needed moving to and from a bed to a chair (including a wheelchair)?: None Help needed standing up from a chair using your arms (e.g., wheelchair or bedside chair)?: None Help needed to walk in hospital room?: None Help needed climbing 3-5 steps with a railing? : A Little 6 Click Score: 23    End of Session   Activity Tolerance: Patient tolerated treatment well;No increased pain Patient left: in bed;with call bell/phone within reach   PT Visit Diagnosis: Difficulty in walking, not elsewhere classified (R26.2)    Time: 5364-6803 PT Time Calculation (min) (ACUTE ONLY): 18 min   Charges:   PT Evaluation $PT Eval Low Complexity: 1 Low          3:40 PM, 02/27/21 Etta Grandchild, PT, DPT Physical Therapist - [facility]  [phone] ([place])     C 02/27/2021, 3:37 PM

## 2021-02-27 NOTE — Progress Notes (Signed)
Initial Nutrition Assessment  DOCUMENTATION CODES:   Obesity unspecified  INTERVENTION:   Ensure Enlive po TID, each supplement provides 350 kcal and 20 grams of protein  MVI po daily   NUTRITION DIAGNOSIS:   Increased nutrient needs related to catabolic illness (COPD) as evidenced by estimated needs.  GOAL:   Patient will meet greater than or equal to 90% of their needs  MONITOR:   PO intake,Supplement acceptance,Labs,Weight trends,Skin,I & O's  REASON FOR ASSESSMENT:   Consult COPD Protocol  ASSESSMENT:   65 y.o. male with medical history significant for COPD on nighttime O2 at 2-3 L, former heavy smoker, former cocaine abuse, chronic back pain on chronic high dose opioids, CAD, GERD and CVA who is admitted with shortness of breath   Visited pt's room today. Pt asleep at time or RD visit; RD did not wake patient. Pt eating 100% of meals in hospital. Pt's lunch tray was sitting on his side table and was 100% eaten. RD will add supplements and MVI to help pt meet his estimated needs. Per chart, pt appears weight stable at baseline. RD will obtain nutrition related history at follow-up.   Medications reviewed and include: aspirin, plavix, lovenox, meloxicam, solu-medrol, MVI, protonix, oxycodone  Labs reviewed: wbc- 11.8(H)  NUTRITION - FOCUSED PHYSICAL EXAM:  Flowsheet Row Most Recent Value  Orbital Region No depletion  Upper Arm Region Mild depletion  Thoracic and Lumbar Region No depletion  Buccal Region No depletion  Temple Region No depletion  Clavicle Bone Region Mild depletion  Clavicle and Acromion Bone Region Mild depletion  Scapular Bone Region No depletion  Dorsal Hand No depletion  Patellar Region Mild depletion  Anterior Thigh Region Mild depletion  Posterior Calf Region Mild depletion  Edema (RD Assessment) None  Hair Reviewed  Eyes Reviewed  Mouth Reviewed  Skin Reviewed  Nails Reviewed     Diet Order:   Diet Order            Diet Heart  Room service appropriate? Yes; Fluid consistency: Thin  Diet effective now                EDUCATION NEEDS:   No education needs have been identified at this time  Skin:  Skin Assessment: Reviewed RN Assessment  Last BM:  pta  Height:   Ht Readings from Last 1 Encounters:  02/27/21 5\' 11"  (1.803 m)    Weight:   Wt Readings from Last 1 Encounters:  02/27/21 101.9 kg    Ideal Body Weight:  78.18 kg  BMI:  Body mass index is 31.34 kg/m.  Estimated Nutritional Needs:   Kcal:  2200-2500kcal/day  Protein:  110-125g/day  Fluid:  2.3-2.6L/day  Koleen Distance MS, RD, LDN Please refer to [facility] for RD and/or RD on-call/weekend/after hours pager

## 2021-02-27 NOTE — Progress Notes (Addendum)
Anticoagulation monitoring(Lovenox):  64yo  F ordered Lovenox 40 mg Q24h  Filed Weights   02/26/21 2222 02/27/21 1320  Weight: 106.6 kg (235 lb) 101.9 kg (224 lb 11.2 oz)   BMI 31.34   Lab Results  Component Value Date   CREATININE 0.96 02/26/2021   CREATININE 1.40 (H) 09/13/2019   CREATININE 1.07 02/20/2017   Estimated Creatinine Clearance: 94.5 mL/min (by C-G formula based on SCr of 0.96 mg/dL). Hemoglobin & Hematocrit     Component Value Date/Time   HGB 12.5 (L) 02/26/2021 2225   HGB 15.1 07/01/2013 1441   HCT 38.2 (L) 02/26/2021 2225   HCT 42.6 07/01/2013 1441     Per Protocol for Patient with estCrcl >30 ml/min and BMI > 30, will transition to Lovenox 0.5 mg/kg Q24h.      Chinita Greenland PharmD Clinical Pharmacist 02/27/2021

## 2021-02-27 NOTE — ED Notes (Signed)
Informed RN bed assigned 1055

## 2021-02-27 NOTE — Progress Notes (Signed)
OT Cancellation Note  Patient Details Name: STIVEN KASPAR MRN: [id number] DOB: 1956-07-11   Cancelled Treatment:    Reason Eval/Treat Not Completed: OT screened, no needs identified, will sign off  Upon chart review, observation, and in speaking with physical therapist, do not currently anticipate that pt will require skilled OT at this time. He appears to be close to his functional baseline with SOB being his primary limiting factor. Will complete order at this time. Please re-consult should needs change. Thank you.  Gerrianne Scale, [place], OTR/L ascom [phone] 02/27/21, 4:28 PM

## 2021-02-27 NOTE — ED Notes (Addendum)
Pt noted to be standing up in room, stating he cannot breathe. Pt O2 increased to 4lpm via [place]. Pt denies shortness of breath or pain anywhere. Pt O2 maintaining 95%. Pt states he is feeling better with increase in oxygen. Provider notified.

## 2021-02-27 NOTE — ED Notes (Addendum)
Pt on call bell requesting inhaler for increased shortness of breath. Pt continues to oxygenate >94% on 4lpm via [place]. Pt does not currently have prn order for albuterol. Provider notified, prn duoneb ordered.

## 2021-02-28 DIAGNOSIS — J441 Chronic obstructive pulmonary disease with (acute) exacerbation: Secondary | ICD-10-CM | POA: Diagnosis not present

## 2021-02-28 LAB — CBC
HCT: 37 % — ABNORMAL LOW (ref 39.0–52.0)
Hemoglobin: 12.6 g/dL — ABNORMAL LOW (ref 13.0–17.0)
MCH: 31.3 pg (ref 26.0–34.0)
MCHC: 34.1 g/dL (ref 30.0–36.0)
MCV: 91.8 fL (ref 80.0–100.0)
Platelets: 195 10*3/uL (ref 150–400)
RBC: 4.03 MIL/uL — ABNORMAL LOW (ref 4.22–5.81)
RDW: 13.8 % (ref 11.5–15.5)
WBC: 21.8 10*3/uL — ABNORMAL HIGH (ref 4.0–10.5)
nRBC: 0 % (ref 0.0–0.2)

## 2021-02-28 LAB — MAGNESIUM: Magnesium: 2 mg/dL (ref 1.7–2.4)

## 2021-02-28 LAB — BASIC METABOLIC PANEL
Anion gap: 10 (ref 5–15)
BUN: 28 mg/dL — ABNORMAL HIGH (ref 8–23)
CO2: 26 mmol/L (ref 22–32)
Calcium: 9.5 mg/dL (ref 8.9–10.3)
Chloride: 100 mmol/L (ref 98–111)
Creatinine, Ser: 1.04 mg/dL (ref 0.61–1.24)
GFR, Estimated: >60 mL/min (ref >60)
Glucose, Bld: 148 mg/dL — ABNORMAL HIGH (ref 70–99)
Potassium: 3.7 mmol/L (ref 3.5–5.1)
Sodium: 136 mmol/L (ref 135–145)

## 2021-02-28 MED ORDER — GUAIFENESIN-DM 100-10 MG/5ML PO SYRP
5.0000 mL | ORAL_SOLUTION | ORAL | Status: DC | PRN
  Administered 2021-02-28 (×2): 5 mL via ORAL
  Filled 2021-02-28 (×2): qty 5

## 2021-02-28 NOTE — Progress Notes (Signed)
PROGRESS NOTE    Bradley Sawyer  GQQ:[id number] DOB: Feb 12, 1956 DOA: 02/26/2021 PCP: Bradley Rim, MD    Brief Narrative:  Bradley Sawyer is a 65 year old male with past medical history significant for COPD on home O2 2-3 L nightly, former heavy tobacco use, chronic low back pain on chronic high-dose opioids, CAD, history of CVA, who presented to the emergency department on 02/26/2021 with 3-day history of progressive shortness of breath.  Patient reports has been utilizing his O2 throughout the day in addition to his normal nightly use.  He has a chronic cough but denies fever/chills.  No nausea/vomiting or diaphoresis.  In the ED, temperature 97.8 F, HR 59, RR 20, BP 143/118, SPO2 94% on 2 L nasal cannula.  Sodium 139, potassium 3.4, chloride 101, CO2 28, glucose 120, BUN 10, creatinine 0.96.  BNP 16.8, high sensitive troponin 32>32.  WBC 9.8, hemoglobin 12.5, platelets 194.  Chest x-ray with no acute cardiopulmonary disease process.  Patient was started on duo nebs, Solu-Medrol and IV furosemide 20 mg by ED provider.  Hospitalist service consulted for further evaluation and management of acute hypoxic respiratory failure secondary to COPD exacerbation.   Assessment & Plan:   Principal Problem:   COPD exacerbation ([place]) Active Problems:   Essential hypertension   CAD (coronary artery disease)   PAD (peripheral artery disease) ([facility])   Elevated troponin   History of CVA (cerebrovascular accident)   Chronic pain, radicular   Chronic, continuous use of opioids   Acute on chronic hypoxic respiratory failure, POA COPD exacerbation Patient presenting to the ED with progressive shortness of breath over the past 3 days and requiring around-the-clock supplemental oxygen (usually only on qHS).  Chest x-ray with no acute cardiopulmonary disease process.  Patient follows with pulmonology outpatient, Dr. Mortimer Sawyer.  Patient reports compliance with his home medication regimen. --Azithromycin 500  mg p.o. daily x5 days --Solu-Medrol 40 mg IV every 6 hours x1 day followed by prednisone 40 mg p.o. daily to complete 5-day course --DuoNeb every 4 hours as needed wheezing/shortness of breath --Continue supplemental oxygen, maintain SPO2 greater than 88%.  Currently on 2.5L nasal cannula with SPO2 97% (on 2-3L qHS at baseline) --Aggressive chest physiotherapy.  Start mobilizing in the hallway. --Patient will need round-the-clock oxygen supplementation, will prescribe 4 L/min nasal cannula.  Elevated troponin CAD Troponin was slightly elevated at 32, followed by 32; flat.  Likely secondary to demand ischemia from exacerbation of his underlying COPD disease.  Patient follows with cardiology outpatient.  Scheduled to have elective cardiac catheterization upcoming. --Continue aspirin, atorvastatin, clopidogrel, Imdur  --Outpatient follow-up with cardiology --Currently chest pain-free.  Essential hypertension Blood pressure well controlled. --Continue amlodipine 5 mg p.o. daily, lisinopril 20 mg p.o. daily, HCTZ 12.5 mg p.o. daily  PAD History of CVA --Continue Plavix, aspirin and statin  HLD: Atorvastatin 80 mg p.o. daily  Chronic back pain Chronic opioid dependence --Meloxicam 15 mg p.o. daily --Flexeril 10 mg p.o. daily --Continue oxycodone IR 15 mg 6x daily --Outpatient follow-up with pain management  DVT prophylaxis: Lovenox   Code Status: Prior Family Communication: No family present at bedside this morning  Disposition Plan:  Level of care: Progressive Cardiac Status is: Inpatient  Remains inpatient appropriate because:Ongoing diagnostic testing needed not appropriate for outpatient work up, Unsafe d/c plan, IV treatments appropriate due to intensity of illness or inability to take PO and Inpatient level of care appropriate due to severity of illness   Dispo: The patient is from: Home  Anticipated d/c is to: Home              Patient currently is not  medically stable to d/c.   Difficult to place patient No   Consultants:   None  Procedures:   None  Antimicrobials:   Azithromycin 4/8>>   Subjective: Patient seen and examined.  He was mostly sleeping in the bed since yesterday.  At rest he has no symptoms.  When we try to walk him, he had severe bronchospasm needing bronchodilator therapy.  Needed 4 L oxygen on mobility.  Denies any sputum production.  Denies any chest pain at rest.  Afebrile.  Objective: Vitals:   02/28/21 0418 02/28/21 0720 02/28/21 0812 02/28/21 1019  BP: 133/77 119/67  115/79  Pulse: (!) 59 (!) 48 63 (!) 57  Resp:  19  17  Temp: 98.2 F (36.8 C) 98.3 F (36.8 C)  98.7 F (37.1 C)  TempSrc: Oral Oral  Oral  SpO2: 92% 94% 93%   Weight:      Height:        Intake/Output Summary (Last 24 hours) at 02/28/2021 1243 Last data filed at 02/28/2021 0600 Gross per 24 hour  Intake 960 ml  Output 0 ml  Net 960 ml   Filed Weights   02/26/21 2222 02/27/21 1320  Weight: 106.6 kg 101.9 kg    Examination:  General exam: Appears calm and comfortable, appears older than stated age Mostly comfortable at rest.  On minimal distress on mobility. Respiratory system: Mild distress on mobility.  Expiratory wheezes after exertion.  On 4 L oxygen. Cardiovascular system: S1 & S2 heard, RRR. No JVD, murmurs, rubs, gallops or clicks. No pedal edema. Gastrointestinal system: Abdomen is nondistended, soft and nontender. No organomegaly or masses felt. Normal bowel sounds heard. Central nervous system: Alert and oriented. No focal neurological deficits. Extremities: Symmetric 5 x 5 power. Skin: No rashes, lesions or ulcers Psychiatry: Judgement and insight appear normal. Mood & affect appropriate.     Data Reviewed: I have personally reviewed following labs and imaging studies  CBC: Recent Labs  Lab 02/26/21 2225 02/27/21 1342 02/28/21 0437  WBC 9.8 11.8* 21.8*  NEUTROABS 6.5  --   --   HGB 12.5* 12.7* 12.6*   HCT 38.2* 37.5* 37.0*  MCV 93.6 91.5 91.8  PLT 194 194 510   Basic Metabolic Panel: Recent Labs  Lab 02/26/21 2225 02/27/21 1342 02/28/21 0437  NA 139  --  136  K 3.4*  --  3.7  CL 101  --  100  CO2 28  --  26  GLUCOSE 120*  --  148*  BUN 10  --  28*  CREATININE 0.96 0.89 1.04  CALCIUM 9.4  --  9.5  MG  --   --  2.0   GFR: Estimated Creatinine Clearance: 87.2 mL/min (by C-G formula based on SCr of 1.04 mg/dL). Liver Function Tests: No results for input(s): AST, ALT, ALKPHOS, BILITOT, PROT, ALBUMIN in the last 168 hours. No results for input(s): LIPASE, AMYLASE in the last 168 hours. No results for input(s): AMMONIA in the last 168 hours. Coagulation Profile: No results for input(s): INR, PROTIME in the last 168 hours. Cardiac Enzymes: No results for input(s): CKTOTAL, CKMB, CKMBINDEX, TROPONINI in the last 168 hours. BNP (last 3 results) No results for input(s): PROBNP in the last 8760 hours. HbA1C: No results for input(s): HGBA1C in the last 72 hours. CBG: No results for input(s): GLUCAP in the last 168 hours.  Lipid Profile: No results for input(s): CHOL, HDL, LDLCALC, TRIG, CHOLHDL, LDLDIRECT in the last 72 hours. Thyroid Function Tests: No results for input(s): TSH, T4TOTAL, FREET4, T3FREE, THYROIDAB in the last 72 hours. Anemia Panel: No results for input(s): VITAMINB12, FOLATE, FERRITIN, TIBC, IRON, RETICCTPCT in the last 72 hours. Sepsis Labs: No results for input(s): PROCALCITON, LATICACIDVEN in the last 168 hours.  Recent Results (from the past 240 hour(s))  SARS CORONAVIRUS 2 (TAT 6-24 HRS) Nasopharyngeal Nasopharyngeal Swab     Status: None   Collection Time: 02/27/21  3:32 AM   Specimen: Nasopharyngeal Swab  Result Value Ref Range Status   SARS Coronavirus 2 NEGATIVE NEGATIVE Final    Comment: (NOTE) SARS-CoV-2 target nucleic acids are NOT DETECTED.  The SARS-CoV-2 RNA is generally detectable in upper and lower respiratory specimens during the acute  phase of infection. Negative results do not preclude SARS-CoV-2 infection, do not rule out co-infections with other pathogens, and should not be used as the sole basis for treatment or other patient management decisions. Negative results must be combined with clinical observations, patient history, and epidemiological information. The expected result is Negative.  Fact Sheet for Patients: SugarRoll.be  Fact Sheet for Healthcare Providers: https://www.woods-mathews.com/  This test is not yet approved or cleared by the [place] FDA and  has been authorized for detection and/or diagnosis of SARS-CoV-2 by FDA under an Emergency Use Authorization (EUA). This EUA will remain  in effect (meaning this test can be used) for the duration of the COVID-19 declaration under Se ction 564(b)(1) of the Act, 21 U.S.C. section 360bbb-3(b)(1), unless the authorization is terminated or revoked sooner.  Performed at [facility], [place] [address], [place], [place] [address]          Radiology Studies: DG Chest 2 View  Result Date: 02/26/2021 CLINICAL DATA:  Chest pain with difficulty breathing for 3 days. EXAM: CHEST - 2 VIEW COMPARISON:  07/23/2019 FINDINGS: Heart size and pulmonary vascularity are normal. Mild linear changes in the lung bases likely represent vascular crowding with slightly shallow inspiration. No airspace disease or consolidation. No pleural effusions. No pneumothorax. Calcification of the aorta. Degenerative changes in the spine. Postoperative changes in the sternum and lumbar spine. IMPRESSION: No active cardiopulmonary disease. Electronically Signed   By: Lucienne Capers M.D.   On: 02/26/2021 22:51        Scheduled Meds: . amLODipine  5 mg Oral Daily  . aspirin EC  81 mg Oral Daily  . atorvastatin  80 mg Oral Daily  . azithromycin  500 mg Oral Daily  . clopidogrel  75 mg Oral Daily  . cyclobenzaprine  10 mg Oral Daily   . enoxaparin (LOVENOX) injection  0.5 mg/kg Subcutaneous Q24H  . feeding supplement  237 mL Oral TID BM  . FLUoxetine  10 mg Oral Daily  . lisinopril  20 mg Oral Daily   And  . hydrochlorothiazide  12.5 mg Oral Daily  . isosorbide mononitrate  30 mg Oral Daily  . linaclotide  290 mcg Oral QAC breakfast  . loratadine  5 mg Oral QHS  . mouth rinse  15 mL Mouth Rinse BID  . meloxicam  15 mg Oral Daily  . mometasone-formoterol  2 puff Inhalation BID  . montelukast  10 mg Oral Daily  . multivitamin with minerals  1 tablet Oral Daily  . pantoprazole  40 mg Oral Daily  . predniSONE  40 mg Oral Q breakfast  . pregabalin  150 mg  Oral Daily  . rOPINIRole  1 mg Oral QHS  . tamsulosin  0.4 mg Oral Daily  . tiotropium  1 capsule Inhalation Daily   Continuous Infusions:   LOS: 1 day    Time spent: 32 minutes    Barb Merino, MD [facility] Available via Epic secure chat 7am-7pm After these hours, please refer to coverage provider listed on [email or web address] 02/28/2021, 12:43 PM

## 2021-02-28 NOTE — Progress Notes (Signed)
SATURATION QUALIFICATIONS: (This note is used to comply with regulatory documentation for home oxygen)  Patient Saturations on Room Air at Rest = 86%  Patient Saturations on Room Air while Ambulating = 84%  Patient Saturations on 4Liters of oxygen while Ambulating = 91%

## 2021-02-28 NOTE — Telephone Encounter (Signed)
Symbicort is lacking a LAMA.  He will need Symbicort 160/4.5, 2 inhalations twice a day AND Spiriva 2.5 mcg, 2 puffs once a day.  This combination follows GOLD COPD guidelines.

## 2021-03-01 ENCOUNTER — Inpatient Hospital Stay: Payer: Medicaid Other

## 2021-03-01 ENCOUNTER — Encounter: Payer: Self-pay | Admitting: Internal Medicine

## 2021-03-01 DIAGNOSIS — J9621 Acute and chronic respiratory failure with hypoxia: Secondary | ICD-10-CM | POA: Diagnosis not present

## 2021-03-01 DIAGNOSIS — J441 Chronic obstructive pulmonary disease with (acute) exacerbation: Secondary | ICD-10-CM | POA: Diagnosis not present

## 2021-03-01 LAB — CBC WITH DIFFERENTIAL/PLATELET
Abs Immature Granulocytes: 0.1 10*3/uL — ABNORMAL HIGH (ref 0.00–0.07)
Basophils Absolute: 0 10*3/uL (ref 0.0–0.1)
Basophils Relative: 0 %
Eosinophils Absolute: 0 10*3/uL (ref 0.0–0.5)
Eosinophils Relative: 0 %
HCT: 37 % — ABNORMAL LOW (ref 39.0–52.0)
Hemoglobin: 12.6 g/dL — ABNORMAL LOW (ref 13.0–17.0)
Immature Granulocytes: 1 %
Lymphocytes Relative: 17 %
Lymphs Abs: 3.2 10*3/uL (ref 0.7–4.0)
MCH: 31.3 pg (ref 26.0–34.0)
MCHC: 34.1 g/dL (ref 30.0–36.0)
MCV: 92 fL (ref 80.0–100.0)
Monocytes Absolute: 1.1 10*3/uL — ABNORMAL HIGH (ref 0.1–1.0)
Monocytes Relative: 6 %
Neutro Abs: 14.7 10*3/uL — ABNORMAL HIGH (ref 1.7–7.7)
Neutrophils Relative %: 76 %
Platelets: 177 10*3/uL (ref 150–400)
RBC: 4.02 MIL/uL — ABNORMAL LOW (ref 4.22–5.81)
RDW: 13.9 % (ref 11.5–15.5)
WBC: 19.1 10*3/uL — ABNORMAL HIGH (ref 4.0–10.5)
nRBC: 0 % (ref 0.0–0.2)

## 2021-03-01 MED ORDER — ALBUTEROL SULFATE (2.5 MG/3ML) 0.083% IN NEBU
2.5000 mg | INHALATION_SOLUTION | Freq: Four times a day (QID) | RESPIRATORY_TRACT | Status: DC | PRN
  Administered 2021-03-01 – 2021-03-02 (×3): 2.5 mg via RESPIRATORY_TRACT
  Filled 2021-03-01 (×3): qty 3

## 2021-03-01 MED ORDER — ARFORMOTEROL TARTRATE 15 MCG/2ML IN NEBU
15.0000 ug | INHALATION_SOLUTION | Freq: Two times a day (BID) | RESPIRATORY_TRACT | Status: DC
  Administered 2021-03-01 – 2021-03-03 (×4): 15 ug via RESPIRATORY_TRACT
  Filled 2021-03-01 (×6): qty 2

## 2021-03-01 MED ORDER — IPRATROPIUM-ALBUTEROL 0.5-2.5 (3) MG/3ML IN SOLN: 3.0000 mL | Freq: Four times a day (QID) | RESPIRATORY_TRACT | Status: DC

## 2021-03-01 MED ORDER — IOHEXOL 350 MG/ML SOLN
75.0000 mL | Freq: Once | INTRAVENOUS | Status: AC | PRN
  Administered 2021-03-01: 75 mL via INTRAVENOUS

## 2021-03-01 MED ORDER — REVEFENACIN 175 MCG/3ML IN SOLN
175.0000 ug | Freq: Every day | RESPIRATORY_TRACT | Status: DC
  Administered 2021-03-02 – 2021-03-03 (×2): 175 ug via RESPIRATORY_TRACT
  Filled 2021-03-01 (×4): qty 3

## 2021-03-01 MED ORDER — BUDESONIDE 0.25 MG/2ML IN SUSP
0.2500 mg | Freq: Two times a day (BID) | RESPIRATORY_TRACT | Status: DC
  Administered 2021-03-01 – 2021-03-03 (×4): 0.25 mg via RESPIRATORY_TRACT
  Filled 2021-03-01 (×4): qty 2

## 2021-03-01 NOTE — Progress Notes (Signed)
Received report from [employer], agree with prev. Assessment.

## 2021-03-01 NOTE — Consult Note (Signed)
Reason for Consult: Prolonged COPD exacerbation Referring Physician: Barb Merino, MD   Bradley Sawyer is an 65 y.o. male.   HPI:  Bradley Sawyer is a 65 year old recent former smoker (quit 3 months ago) with a history of moderate to severe COPD well-known to our clinic from follow-up at [facility].  He has nocturnal hypoxemia due to COPD and wears 2 L of oxygen nocturnally.  He was most recently seen on 10 February 2021.  At that time he was noted to have a very mild exacerbation with faint expiratory wheezing in given a short course of prednisone and switched his inhaler to Breztri 2 puffs twice a day.  This is a LABA LAMA ICS combination.  The patient actually noted improvement with the [facility] however at the time of getting it filled Medicaid requested alternative medication use.  He resumed his prior regimen and noted that this was not as effective.  Because of persistent dyspnea he was seen by his cardiologist Dr. Humphrey Sawyer who was intending to do a cardiac cath to evaluate his coronary anatomy. He presented on 7 April to [facility] ED with worsening shortness of breath.  He required admission due to acute respiratory failure with hypoxia.  He has been treated with nebulized bronchodilators, steroids, and pulmonary hygiene.  He is now requiring oxygen even during the day and with ambulation.  Today it was noted that even with supplemental oxygen at 5 L/min his oxygen saturations dropped to 80% during ambulation and the patient became very breathless.  On my assessment of him today he tells me that he is actually feeling that his breathing is back to baseline.  He has not had any chest pain.  No fevers, chills or sweats.  He has tenacious sputum and feels that albuterol breaks it up better than anything.  He has not had any nausea or vomiting.  No lower extremity edema.  CT angio chest was appropriately ordered.  This was performed today.  Shows no pulmonary embolus.  There is no pneumonia visible.  COPD changes and mild bibasilar atelectasis.  In the hospital the patient has been placed on Dulera and Spiriva which he notes are not effective.   Past Medical History:  Diagnosis Date  . Anxiety   . Arthritis   . 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 Cocaine dependence    Last use 1998, attends 3 NA meetings weekly.  . Compression fracture    L2  . COPD (chronic obstructive pulmonary disease) (HCC)    bronchitis  . CVA (cerebral infarction)   . Disorder of vocal cord   . Fatty liver   . GERD (gastroesophageal reflux disease)   . Hx of cardiovascular stress test    ETT/Lexiscan-Myoview (10/15):  Inferior, inferoapical, apical lateral defect c/w mild ischemia, EF 66%; Intermediate Risk  . Hyperlipidemia   . Hypertension   . Right shoulder pain    2/2 partial rotator cuff tear, tendinopathy, mild subacromial/subdeltioid bursitis, A/C joint arthropathy per MRI done in 3/07  . Tobacco abuse     Past Surgical History:  Procedure Laterality Date  . ANTERIOR LAT LUMBAR FUSION N/A 12/18/2013   Procedure: Lumbar Two Anteriorlateral Corpectomy w/ cage, interbody fusion, anterior plating, Lumbar One to Lumbar Three percutaneous pedicle screws;  Surgeon: Charlie Pitter, MD;  Location: [place] NEURO ORS;  Service: Neurosurgery;  Laterality: N/A;  . AORTA -INNOMIATE BYPASS N/A 10/15/2014   Procedure: ENDARTERCTOMY OF  INNOMINATE ARTERY ;  Surgeon: Elam Dutch, MD;  Location: [facility] OR;  Service: Vascular;  Laterality: N/A;  . ARCH AORTOGRAM N/A 08/23/2014   Procedure: ARCH AORTOGRAM;  Surgeon: Elam Dutch, MD;  Location: [facility] CATH LAB;  Service: Cardiovascular;  Laterality: N/A;  . arch aortogram, left carotid,left subclavian angiogram  08-23-2014  . CAROTID ANGIOGRAM Left 08/23/2014   Procedure: CAROTID ANGIOGRAM;  Surgeon: Elam Dutch, MD;  Location: [facility] CATH LAB;  Service: Cardiovascular;  Laterality: Left;  . CHOLECYSTECTOMY  12/2016  . ENDARTERECTOMY Right  10/15/2014   Procedure: ENDARTERECTOMY RIGHT SUBCLAVIAN ARTERY;  Surgeon: Elam Dutch, MD;  Location: [place];  Service: Vascular;  Laterality: Right;  . ENDARTERECTOMY Right 10/15/2014   Procedure: ENDARTERECTOMY RIGHT COMMON CAROTID ARTERY;  Surgeon: Elam Dutch, MD;  Location: [place];  Service: Vascular;  Laterality: Right;  . 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 LEFT HEART CATHETERIZATION WITH CORONARY ANGIOGRAM N/A 09/12/2014   Procedure: LEFT HEART CATHETERIZATION WITH CORONARY ANGIOGRAM;  Surgeon: Josue Hector, MD;  Location: [facility] C[facility] LAB;  Service: Cardiovascular;  Laterality: N/A;  . LUMBAR SPINE SURGERY  12/18/2013   L2     DR POOL  . NECK SURGERY    . PATCH ANGIOPLASTY Right 10/15/2014   Procedure: PATCH ANGIOPLASTY OF RIGHT SUBCLAVIAN ARTERY , RIGHT COMMON CAROTID ARTERY & INNOMINATE ARTERY;  Surgeon: Elam Dutch, MD;  Location: [place];  Service: Vascular;  Laterality: Right;  . STERNOTOMY N/A 10/15/2014   Procedure: PARTIAL STERNOTOMY & PLATING OF STERNUM;  Surgeon: Elam Dutch, MD;  Location: [place];  Service: Vascular;  Laterality: N/A;  . TEE WITHOUT CARDIOVERSION N/A 12/25/2013   Procedure: TRANSESOPHAGEAL ECHOCARDIOGRAM (TEE);  Surgeon: Larey Dresser, MD;  Location: [facility] ENDOSCOPY;  Service: Cardiovascular;  Laterality: N/A;    Family History  Problem Relation Age of Onset  . Heart disease Father   . Heart attack Father   . Diabetes Paternal Grandmother        1 st degree relatives  . Hypertension Paternal Grandmother   . Diabetes Brother   . Heart disease Brother   . Muscular dystrophy Brother   . Muscular dystrophy Sister   . Prostate cancer Neg Hx   . Kidney cancer Neg Hx   . Bladder Cancer Neg Hx     Social History:  Social History   Tobacco Use  . Smoking status: Former Smoker    Packs/day: 2.00    Years: 48.00    Pack years: 96.00    Types: Cigarettes    Quit date: 2022    Years since quitting: 0.2  . Smokeless tobacco:  Never Used  Substance Use Topics  . Alcohol use: No    Alcohol/week: 0.0 standard drinks     Allergies:  Allergies  Allergen Reactions  . Bee Venom Swelling    Medications:  Scheduled Meds: . amLODipine  5 mg Oral Daily  . aspirin EC  81 mg Oral Daily  . atorvastatin  80 mg Oral Daily  . azithromycin  500 mg Oral Daily  . clopidogrel  75 mg Oral Daily  . cyclobenzaprine  10 mg Oral Daily  . enoxaparin (LOVENOX) injection  0.5 mg/kg Subcutaneous Q24H  . feeding supplement  237 mL Oral TID BM  . FLUoxetine  10 mg Oral Daily  . lisinopril  20 mg Oral Daily   And  . hydrochlorothiazide  12.5 mg Oral Daily  . ipratropium-albuterol  3 mL Nebulization Q6H  . isosorbide mononitrate  30 mg Oral  Daily  . linaclotide  290 mcg Oral QAC breakfast  . loratadine  5 mg Oral QHS  . mouth rinse  15 mL Mouth Rinse BID  . meloxicam  15 mg Oral Daily  . mometasone-formoterol  2 puff Inhalation BID  . montelukast  10 mg Oral Daily  . multivitamin with minerals  1 tablet Oral Daily  . pantoprazole  40 mg Oral Daily  . predniSONE  40 mg Oral Q breakfast  . pregabalin  150 mg Oral Daily  . rOPINIRole  1 mg Oral QHS  . tamsulosin  0.4 mg Oral Daily  . tiotropium  1 capsule Inhalation Daily   Continuous Infusions: PRN Meds:.guaiFENesin-dextromethorphan, ipratropium-albuterol, nitroGLYCERIN, oxyCODONE, polyethylene glycol   Results for orders placed or performed during the hospital encounter of 02/26/21 (from the past 48 hour(s))  HIV Antibody (routine testing w rflx)     Status: None   Collection Time: 02/27/21  1:33 PM  Result Value Ref Range   HIV Screen 4th Generation wRfx Non Reactive Non Reactive    Comment: Performed at [facility], [address], [place], [place] [address]  CBC     Status: Abnormal   Collection Time: 02/27/21  1:42 PM  Result Value Ref Range   WBC 11.8 (H) 4.0 - 10.5 K/uL   RBC 4.10 (L) 4.22 - 5.81 MIL/uL   Hemoglobin 12.7 (L) 13.0 - 17.0 g/dL   HCT  37.5 (L) 39.0 - 52.0 %   MCV 91.5 80.0 - 100.0 fL   MCH 31.0 26.0 - 34.0 pg   MCHC 33.9 30.0 - 36.0 g/dL   RDW 13.4 11.5 - 15.5 %   Platelets 194 150 - 400 K/uL   nRBC 0.0 0.0 - 0.2 %    Comment: Performed at [facility], [address] [place], [place] [address]  Creatinine, serum     Status: None   Collection Time: 02/27/21  1:42 PM  Result Value Ref Range   Creatinine, Ser 0.89 0.61 - 1.24 mg/dL   GFR, Estimated >60 >60 mL/min    Comment: (NOTE) Calculated using the CKD-EPI Creatinine Equation (2021) Performed at [facility], [address] [place], [place] [address]   CBC     Status: Abnormal   Collection Time: 02/28/21  4:37 AM  Result Value Ref Range   WBC 21.8 (H) 4.0 - 10.5 K/uL   RBC 4.03 (L) 4.22 - 5.81 MIL/uL   Hemoglobin 12.6 (L) 13.0 - 17.0 g/dL   HCT 37.0 (L) 39.0 - 52.0 %   MCV 91.8 80.0 - 100.0 fL   MCH 31.3 26.0 - 34.0 pg   MCHC 34.1 30.0 - 36.0 g/dL   RDW 13.8 11.5 - 15.5 %   Platelets 195 150 - 400 K/uL   nRBC 0.0 0.0 - 0.2 %    Comment: Performed at [facility], [address], [place], [place] [address]  Basic metabolic panel     Status: Abnormal   Collection Time: 02/28/21  4:37 AM  Result Value Ref Range   Sodium 136 135 - 145 mmol/L   Potassium 3.7 3.5 - 5.1 mmol/L   Chloride 100 98 - 111 mmol/L   CO2 26 22 - 32 mmol/L   Glucose, Bld 148 (H) 70 - 99 mg/dL    Comment: Glucose reference range applies only to samples taken after fasting for at least 8 hours.   BUN 28 (H) 8 - 23 mg/dL   Creatinine, Ser 1.04 0.61 -  1.24 mg/dL   Calcium 9.5 8.9 - 10.3 mg/dL   GFR, Estimated >60 >60 mL/min    Comment: (NOTE) Calculated using the CKD-EPI Creatinine Equation (2021)    Anion gap 10 5 - 15    Comment: Performed at [facility], [address] [place], [place] [address]  Magnesium     Status: None   Collection Time: 02/28/21  4:37 AM  Result Value Ref Range   Magnesium 2.0 1.7 - 2.4 mg/dL    Comment:  Performed at [facility], [address] [place], [place] [address]  CBC with Differential/Platelet     Status: Abnormal   Collection Time: 03/01/21  4:31 AM  Result Value Ref Range   WBC 19.1 (H) 4.0 - 10.5 K/uL   RBC 4.02 (L) 4.22 - 5.81 MIL/uL   Hemoglobin 12.6 (L) 13.0 - 17.0 g/dL   HCT 37.0 (L) 39.0 - 52.0 %   MCV 92.0 80.0 - 100.0 fL   MCH 31.3 26.0 - 34.0 pg   MCHC 34.1 30.0 - 36.0 g/dL   RDW 13.9 11.5 - 15.5 %   Platelets 177 150 - 400 K/uL   nRBC 0.0 0.0 - 0.2 %   Neutrophils Relative % 76 %   Neutro Abs 14.7 (H) 1.7 - 7.7 K/uL   Lymphocytes Relative 17 %   Lymphs Abs 3.2 0.7 - 4.0 K/uL   Monocytes Relative 6 %   Monocytes Absolute 1.1 (H) 0.1 - 1.0 K/uL   Eosinophils Relative 0 %   Eosinophils Absolute 0.0 0.0 - 0.5 K/uL   Basophils Relative 0 %   Basophils Absolute 0.0 0.0 - 0.1 K/uL   Immature Granulocytes 1 %   Abs Immature Granulocytes 0.10 (H) 0.00 - 0.07 K/uL    Comment: Performed at [facility], [address] [place], [place] [address]    CT ANGIO CHEST PE W OR WO CONTRAST  Result Date: 03/01/2021 CLINICAL DATA:  Shortness of breath EXAM: CT ANGIOGRAPHY CHEST WITH CONTRAST TECHNIQUE: Multidetector CT imaging of the chest was performed using the standard protocol during bolus administration of intravenous contrast. Multiplanar CT image reconstructions and MIPs were obtained to evaluate the vascular anatomy. CONTRAST:  93mL OMNIPAQUE IOHEXOL 350 MG/ML SOLN COMPARISON:  Chest radiograph and chest CT February 26, 2021 FINDINGS: Cardiovascular: There is no demonstrable pulmonary embolus. There is no thoracic aortic aneurysm. No dissection evident. Note that the contrast bolus in the aorta is somewhat less than optimal for dissection assessment. There are scattered foci of calcification in visualized great vessels. There is aortic atherosclerosis as well as foci of coronary artery calcification. There is no pericardial effusion or pericardial thickening.  Mediastinum/Nodes: Visualized thyroid appears unremarkable. No appreciable thoracic adenopathy. No esophageal lesions are appreciable. Lungs/Pleura: There is again noted centrilobular and paraseptal emphysematous change. There is atelectatic change in each lung base. There is no appreciable edema or consolidation. Stable scattered 1-2 mm nodular opacities. No evident pleural effusion. Trachea and major bronchial structures appear normal. No evident pneumothorax. Upper Abdomen: There is upper abdominal aortic atherosclerosis. Gallbladder absent. Visualized upper abdominal structures otherwise appear normal. Musculoskeletal: Status post median sternotomy degenerative change in thoracic spine. Incomplete visualization of postoperative change in lower thoracic/upper lumbar region. No blastic or lytic bone lesions. No evident chest wall lesions. Review of the MIP images confirms the above findings. IMPRESSION: 1. No demonstrable pulmonary embolus. No thoracic aortic aneurysm or dissection. There is aortic atherosclerosis as well as foci of great vessel and coronary  artery calcification. 2. Bibasilar atelectasis. No edema or airspace opacity. Underlying emphysematous changes noted. 3.  No evident adenopathy. 4.  Gallbladder absent. Aortic Atherosclerosis (ICD10-I70.0) and Emphysema (ICD10-J43.9). Electronically Signed   By: Lowella Grip III M.D.   On: 03/01/2021 11:42    Review of Systems  A 10 point review of systems was performed and it is as noted above otherwise negative.  Blood pressure 129/67, pulse 72, temperature 98.4 F (36.9 C), temperature source Oral, resp. rate 19, height 5\' 11"  (1.803 m), weight 101.9 kg, SpO2 93 %.   Physical Exam GENERAL: Disheveled appearing gentleman with full beard.  No conversational dyspnea.  Comfortable on nasal cannula O2.  HEAD: Normocephalic, atraumatic.  EYES: Pupils equal, round, reactive to light.  No scleral icterus.  MOUTH: Poor dentition, oral mucosa  moist. NECK: Supple. No thyromegaly. Trachea midline. No JVD.  No adenopathy. PULMONARY: Good air entry bilaterally.  There is end expiratory wheezing throughout, I:E 1:3 CARDIOVASCULAR: S1 and S2. Regular rate and rhythm.  No rubs, murmurs or gallops heard. ABDOMEN: Protuberant, soft, no distention. MUSCULOSKELETAL: No joint deformity, no clubbing, no edema.  NEUROLOGIC: No focal deficit, speech fluent.   SKIN: Intact,warm,dry.  No rashes. PSYCH: Mood and behavior normal  Assessment/Plan:   Acute exacerbation of COPD Trigger may have been bronchitis early on No evidence of remaining infection Optimize nebulization treatments Brovana plus Pulmicort and Yupelri nebulized As needed albuterol for breakthrough shortness of breath Optimize pulmonary hygiene Flutter valve  COPD GOLD stage IV Tenacious secretions Nebulization treatments as above Recently discontinued smoking Add expectorant Patient notes Dulera and Spiriva are not effective   Acute on chronic respiratory failure with hypoxia Titrate O2 for saturations of 88 to 92% Further O2 titration can be performed as an outpatient Patient's DME company is [employer]   We will see how the patient does with the nebulization regimen as above.  May need preauthorization to obtain these medications for him if these are effective.    Renold Don, MD [facility]  03/01/2021, 11:48 AM   *This note was dictated using voice recognition software/Dragon.  Despite best efforts to proofread, errors can occur which can change the meaning.  Any change was purely unintentional.

## 2021-03-01 NOTE — Progress Notes (Signed)
PROGRESS NOTE    Bradley Sawyer  CXK:[id number] DOB: August 18, 1956 DOA: 02/26/2021 PCP: Curly Rim, MD    Brief Narrative:  Bradley Sawyer is a 65 year old male with past medical history significant for COPD on home O2 2-3 L nightly, former heavy tobacco use, chronic low back pain on chronic high-dose opioids, CAD, history of CVA, who presented to the emergency department on 02/26/2021 with 3-day history of progressive shortness of breath.  Patient reports has been utilizing his O2 throughout the day in addition to his normal nightly use.  He has a chronic cough but denies fever/chills.  No nausea/vomiting or diaphoresis.  In the ED, temperature 97.8 F, HR 59, RR 20, BP 143/118, SPO2 94% on 2 L nasal cannula.  Sodium 139, potassium 3.4, chloride 101, CO2 28, glucose 120, BUN 10, creatinine 0.96.  BNP 16.8, high sensitive troponin 32>32.  WBC 9.8, hemoglobin 12.5, platelets 194.  Chest x-ray with no acute cardiopulmonary disease process.  Patient was started on duo nebs, Solu-Medrol and IV furosemide 20 mg by ED provider.  Hospitalist service consulted for further evaluation and management of acute hypoxic respiratory failure secondary to COPD exacerbation.   Assessment & Plan:   Principal Problem:   COPD exacerbation ([place]) Active Problems:   Essential hypertension   CAD (coronary artery disease)   PAD (peripheral artery disease) ([facility])   Elevated troponin   History of CVA (cerebrovascular accident)   Chronic pain, radicular   Chronic, continuous use of opioids   Acute on chronic hypoxic respiratory failure, POA COPD exacerbation Patient presenting to the ED with progressive shortness of breath over the past 3 days and requiring around-the-clock supplemental oxygen (usually only on qHS).  Chest x-ray with no acute cardiopulmonary disease process.  Patient follows with pulmonology outpatient, Dr. Mortimer Fries.  Patient reports compliance with his home medication regimen. --Azithromycin 500  mg p.o. daily x5 days --Solu-Medrol 40 mg IV every 6 hours x1 day followed by prednisone 40 mg p.o. daily to complete 5-day course --DuoNeb every 4 hours as needed wheezing/shortness of breath --We will add scheduled bronchodilator therapy due to significant symptoms. --Will evaluate for supplemental oxygen need when ready to go home. --Aggressive chest physiotherapy.  Continue mobilizing in the hallway. --Due to significant symptoms, a repeat CT scan of the chest today shows no evidence of PE, no consolidation.  Extensive emphysematous changes. Called and discussed case with pulmonary to see patient in consultation.  Elevated troponin, CAD Troponin was slightly elevated at 32, followed by 32; flat.  Likely secondary to demand ischemia from exacerbation of his underlying COPD disease.  Patient follows with cardiology outpatient.  Scheduled to have elective cardiac catheterization upcoming. --Continue aspirin, atorvastatin, clopidogrel, Imdur  --Outpatient follow-up with cardiology --Currently chest pain-free.  Will check 2D echocardiogram today.  If persistent shortness of breath, will discuss with cardiology tomorrow.  Essential hypertension Blood pressure well controlled. --Continue amlodipine 5 mg p.o. daily, lisinopril 20 mg p.o. daily, HCTZ 12.5 mg p.o. daily  PAD History of CVA --Continue Plavix, aspirin and statin  HLD: Atorvastatin 80 mg p.o. daily  Chronic back pain Chronic opioid dependence --Meloxicam 15 mg p.o. daily --Flexeril 10 mg p.o. daily --Continue oxycodone IR 15 mg 6x daily --Outpatient follow-up with pain management  DVT prophylaxis: Lovenox   Code Status: Prior Family Communication: No family present at bedside this morning  Disposition Plan:  Level of care: Progressive Cardiac Status is: Inpatient  Remains inpatient appropriate because:Ongoing diagnostic testing needed not appropriate for  outpatient work up, Unsafe d/c plan, IV treatments appropriate  due to intensity of illness or inability to take PO and Inpatient level of care appropriate due to severity of illness   Dispo: The patient is from: Home              Anticipated d/c is to: Home              Patient currently is not medically stable to d/c.   Difficult to place patient No   Consultants:   Pulmonary  Procedures:   None  Antimicrobials:   Azithromycin 4/8>>   Subjective: Patient seen and examined.  At rest he was doing fairly well.  We mobilized him and had severe episodes of bronchospasm and anxiety.  Needed 5 L of oxygen and 1 to 2 minutes to settle down.  Very anxious about unable to go home.  Slightly frustrated due to not getting better faster.   Objective: Vitals:   03/01/21 0855 03/01/21 1015 03/01/21 1206 03/01/21 1234  BP:   97/62   Pulse: (!) 49 72 (!) 48 61  Resp:   20   Temp:   97.9 F (36.6 C)   TempSrc:   Oral   SpO2: 92% 93% 93%   Weight:      Height:        Intake/Output Summary (Last 24 hours) at 03/01/2021 1343 Last data filed at 03/01/2021 0950 Gross per 24 hour  Intake 480 ml  Output --  Net 480 ml   Filed Weights   02/26/21 2222 02/27/21 1320  Weight: 106.6 kg 101.9 kg    Examination:  General exam: Appears calm and comfortable, appears older than stated age Mostly comfortable at rest.  Moderate distress on mobility. Respiratory system: Mild distress on mobility.  Expiratory wheezes after exertion.  On 5 L oxygen while mobilized. Cardiovascular system: S1 & S2 heard, RRR. No JVD, murmurs, rubs, gallops or clicks. No pedal edema. Gastrointestinal system: Abdomen is nondistended, soft and nontender. No organomegaly or masses felt. Normal bowel sounds heard. Central nervous system: Alert and oriented. No focal neurological deficits. Extremities: Symmetric 5 x 5 power. Skin: No rashes, lesions or ulcers Psychiatry: Judgement and insight appear normal. Mood & affect appropriate.     Data Reviewed: I have personally  reviewed following labs and imaging studies  CBC: Recent Labs  Lab 02/26/21 2225 02/27/21 1342 02/28/21 0437 03/01/21 0431  WBC 9.8 11.8* 21.8* 19.1*  NEUTROABS 6.5  --   --  14.7*  HGB 12.5* 12.7* 12.6* 12.6*  HCT 38.2* 37.5* 37.0* 37.0*  MCV 93.6 91.5 91.8 92.0  PLT 194 194 195 761   Basic Metabolic Panel: Recent Labs  Lab 02/26/21 2225 02/27/21 1342 02/28/21 0437  NA 139  --  136  K 3.4*  --  3.7  CL 101  --  100  CO2 28  --  26  GLUCOSE 120*  --  148*  BUN 10  --  28*  CREATININE 0.96 0.89 1.04  CALCIUM 9.4  --  9.5  MG  --   --  2.0   GFR: Estimated Creatinine Clearance: 87.2 mL/min (by C-G formula based on SCr of 1.04 mg/dL). Liver Function Tests: No results for input(s): AST, ALT, ALKPHOS, BILITOT, PROT, ALBUMIN in the last 168 hours. No results for input(s): LIPASE, AMYLASE in the last 168 hours. No results for input(s): AMMONIA in the last 168 hours. Coagulation Profile: No results for input(s): INR, PROTIME in the last 168  hours. Cardiac Enzymes: No results for input(s): CKTOTAL, CKMB, CKMBINDEX, TROPONINI in the last 168 hours. BNP (last 3 results) No results for input(s): PROBNP in the last 8760 hours. HbA1C: No results for input(s): HGBA1C in the last 72 hours. CBG: No results for input(s): GLUCAP in the last 168 hours. Lipid Profile: No results for input(s): CHOL, HDL, LDLCALC, TRIG, CHOLHDL, LDLDIRECT in the last 72 hours. Thyroid Function Tests: No results for input(s): TSH, T4TOTAL, FREET4, T3FREE, THYROIDAB in the last 72 hours. Anemia Panel: No results for input(s): VITAMINB12, FOLATE, FERRITIN, TIBC, IRON, RETICCTPCT in the last 72 hours. Sepsis Labs: No results for input(s): PROCALCITON, LATICACIDVEN in the last 168 hours.  Recent Results (from the past 240 hour(s))  SARS CORONAVIRUS 2 (TAT 6-24 HRS) Nasopharyngeal Nasopharyngeal Swab     Status: None   Collection Time: 02/27/21  3:32 AM   Specimen: Nasopharyngeal Swab  Result Value  Ref Range Status   SARS Coronavirus 2 NEGATIVE NEGATIVE Final    Comment: (NOTE) SARS-CoV-2 target nucleic acids are NOT DETECTED.  The SARS-CoV-2 RNA is generally detectable in upper and lower respiratory specimens during the acute phase of infection. Negative results do not preclude SARS-CoV-2 infection, do not rule out co-infections with other pathogens, and should not be used as the sole basis for treatment or other patient management decisions. Negative results must be combined with clinical observations, patient history, and epidemiological information. The expected result is Negative.  Fact Sheet for Patients: SugarRoll.be  Fact Sheet for Healthcare Providers: https://www.woods-mathews.com/  This test is not yet approved or cleared by the [place] FDA and  has been authorized for detection and/or diagnosis of SARS-CoV-2 by FDA under an Emergency Use Authorization (EUA). This EUA will remain  in effect (meaning this test can be used) for the duration of the COVID-19 declaration under Se ction 564(b)(1) of the Act, 21 U.S.C. section 360bbb-3(b)(1), unless the authorization is terminated or revoked sooner.  Performed at [facility], [place] [address] [place], [place] [address]          Radiology Studies: CT ANGIO CHEST PE W OR WO CONTRAST  Result Date: 03/01/2021 CLINICAL DATA:  Shortness of breath EXAM: CT ANGIOGRAPHY CHEST WITH CONTRAST TECHNIQUE: Multidetector CT imaging of the chest was performed using the standard protocol during bolus administration of intravenous contrast. Multiplanar CT image reconstructions and MIPs were obtained to evaluate the vascular anatomy. CONTRAST:  38mL OMNIPAQUE IOHEXOL 350 MG/ML SOLN COMPARISON:  Chest radiograph and chest CT February 26, 2021 FINDINGS: Cardiovascular: There is no demonstrable pulmonary embolus. There is no thoracic aortic aneurysm. No dissection evident. Note that the  contrast bolus in the aorta is somewhat less than optimal for dissection assessment. There are scattered foci of calcification in visualized great vessels. There is aortic atherosclerosis as well as foci of coronary artery calcification. There is no pericardial effusion or pericardial thickening. Mediastinum/Nodes: Visualized thyroid appears unremarkable. No appreciable thoracic adenopathy. No esophageal lesions are appreciable. Lungs/Pleura: There is again noted centrilobular and paraseptal emphysematous change. There is atelectatic change in each lung base. There is no appreciable edema or consolidation. Stable scattered 1-2 mm nodular opacities. No evident pleural effusion. Trachea and major bronchial structures appear normal. No evident pneumothorax. Upper Abdomen: There is upper abdominal aortic atherosclerosis. Gallbladder absent. Visualized upper abdominal structures otherwise appear normal. Musculoskeletal: Status post median sternotomy degenerative change in thoracic spine. Incomplete visualization of postoperative change in lower thoracic/upper lumbar region. No blastic or lytic bone lesions. No evident  chest wall lesions. Review of the MIP images confirms the above findings. IMPRESSION: 1. No demonstrable pulmonary embolus. No thoracic aortic aneurysm or dissection. There is aortic atherosclerosis as well as foci of great vessel and coronary artery calcification. 2. Bibasilar atelectasis. No edema or airspace opacity. Underlying emphysematous changes noted. 3.  No evident adenopathy. 4.  Gallbladder absent. Aortic Atherosclerosis (ICD10-I70.0) and Emphysema (ICD10-J43.9). Electronically Signed   By: Lowella Grip III M.D.   On: 03/01/2021 11:42        Scheduled Meds: . amLODipine  5 mg Oral Daily  . arformoterol  15 mcg Nebulization BID  . aspirin EC  81 mg Oral Daily  . atorvastatin  80 mg Oral Daily  . azithromycin  500 mg Oral Daily  . budesonide (PULMICORT) nebulizer solution  0.25 mg  Nebulization BID  . clopidogrel  75 mg Oral Daily  . cyclobenzaprine  10 mg Oral Daily  . enoxaparin (LOVENOX) injection  0.5 mg/kg Subcutaneous Q24H  . feeding supplement  237 mL Oral TID BM  . FLUoxetine  10 mg Oral Daily  . lisinopril  20 mg Oral Daily   And  . hydrochlorothiazide  12.5 mg Oral Daily  . isosorbide mononitrate  30 mg Oral Daily  . linaclotide  290 mcg Oral QAC breakfast  . loratadine  5 mg Oral QHS  . mouth rinse  15 mL Mouth Rinse BID  . meloxicam  15 mg Oral Daily  . montelukast  10 mg Oral Daily  . multivitamin with minerals  1 tablet Oral Daily  . pantoprazole  40 mg Oral Daily  . predniSONE  40 mg Oral Q breakfast  . pregabalin  150 mg Oral Daily  . revefenacin  175 mcg Nebulization Daily  . rOPINIRole  1 mg Oral QHS  . tamsulosin  0.4 mg Oral Daily   Continuous Infusions:   LOS: 2 days    Time spent: 32 minutes    Barb Merino, MD [facility] Available via Epic secure chat 7am-7pm After these hours, please refer to coverage provider listed on [email or web address] 03/01/2021, 1:43 PM

## 2021-03-02 ENCOUNTER — Inpatient Hospital Stay (HOSPITAL_COMMUNITY)
Admit: 2021-03-02 | Discharge: 2021-03-02 | Disposition: A | Discharge status: Home / Self Care | Payer: Medicaid Other | Attending: Internal Medicine | Admitting: Internal Medicine

## 2021-03-02 DIAGNOSIS — R0609 Other forms of dyspnea: Secondary | ICD-10-CM

## 2021-03-02 DIAGNOSIS — J441 Chronic obstructive pulmonary disease with (acute) exacerbation: Secondary | ICD-10-CM | POA: Diagnosis not present

## 2021-03-02 DIAGNOSIS — J9621 Acute and chronic respiratory failure with hypoxia: Secondary | ICD-10-CM | POA: Diagnosis not present

## 2021-03-02 LAB — ECHOCARDIOGRAM COMPLETE
AR max vel: 3.31 cm2
AV Area VTI: 2.96 cm2
AV Area mean vel: 3.1 cm2
AV Mean grad: 2 mmHg
AV Peak grad: 4.4 mmHg
Ao pk vel: 1.05 m/s
Area-P 1/2: 3.36 cm2
Height: 71 in
S' Lateral: 3.96 cm
Weight: 3576 oz

## 2021-03-02 MED ORDER — PERFLUTREN LIPID MICROSPHERE
1.0000 mL | INTRAVENOUS | Status: AC | PRN
  Administered 2021-03-02: 2 mL via INTRAVENOUS
  Filled 2021-03-02: qty 10

## 2021-03-02 MED ORDER — ALBUTEROL SULFATE HFA 108 (90 BASE) MCG/ACT IN AERS
2.0000 | INHALATION_SPRAY | Freq: Four times a day (QID) | RESPIRATORY_TRACT | Status: DC | PRN
  Administered 2021-03-03: 2 via RESPIRATORY_TRACT
  Filled 2021-03-02: qty 6.7

## 2021-03-02 NOTE — Progress Notes (Addendum)
Follow up -Pulmonary Medicine Note  Patient Details:    Bradley Sawyer is an 65 y.o. male recent former smoker (quit 3 months ago) with a history of moderate to severe COPD well-known to our clinic from follow-up at [facility].  He has nocturnal hypoxemia due to COPD and wears 2 L of oxygen nocturnally.  Presented on 7 April with a COPD exacerbation.  Now requiring oxygen during the day as well.  Pulmonary medicine asked to evaluate for prolonged COPD exacerbation.   Scheduled Meds: . amLODipine  5 mg Oral Daily  . arformoterol  15 mcg Nebulization BID  . aspirin EC  81 mg Oral Daily  . atorvastatin  80 mg Oral Daily  . azithromycin  500 mg Oral Daily  . budesonide (PULMICORT) nebulizer solution  0.25 mg Nebulization BID  . clopidogrel  75 mg Oral Daily  . cyclobenzaprine  10 mg Oral Daily  . enoxaparin (LOVENOX) injection  0.5 mg/kg Subcutaneous Q24H  . feeding supplement  237 mL Oral TID BM  . FLUoxetine  10 mg Oral Daily  . lisinopril  20 mg Oral Daily   And  . hydrochlorothiazide  12.5 mg Oral Daily  . isosorbide mononitrate  30 mg Oral Daily  . linaclotide  290 mcg Oral QAC breakfast  . loratadine  5 mg Oral QHS  . mouth rinse  15 mL Mouth Rinse BID  . meloxicam  15 mg Oral Daily  . montelukast  10 mg Oral Daily  . multivitamin with minerals  1 tablet Oral Daily  . pantoprazole  40 mg Oral Daily  . predniSONE  40 mg Oral Q breakfast  . pregabalin  150 mg Oral Daily  . revefenacin  175 mcg Nebulization Daily  . rOPINIRole  1 mg Oral QHS  . tamsulosin  0.4 mg Oral Daily   Continuous Infusions: PRN Meds:.albuterol, albuterol, nitroGLYCERIN, oxyCODONE, polyethylene glycol  .  Anti-infectives:  Anti-infectives (From admission, onward)   Start     Dose/Rate Route Frequency Ordered Stop   02/28/21 1000  azithromycin (ZITHROMAX) tablet 500 mg       "Followed by" Linked Group Details   500 mg Oral Daily 02/27/21 0739 03/04/21 0959   02/27/21 0830   azithromycin (ZITHROMAX) 500 mg in sodium chloride 0.9 % 250 mL IVPB       "Followed by" Linked Group Details   500 mg 250 mL/hr over 60 Minutes Intravenous Every 24 hours 02/27/21 0739 02/27/21 0943      Microbiology: Results for orders placed or performed during the hospital encounter of 02/26/21  SARS CORONAVIRUS 2 (TAT 6-24 HRS) Nasopharyngeal Nasopharyngeal Swab     Status: None   Collection Time: 02/27/21  3:32 AM   Specimen: Nasopharyngeal Swab  Result Value Ref Range Status   SARS Coronavirus 2 NEGATIVE NEGATIVE Final    Comment: (NOTE) SARS-CoV-2 target nucleic acids are NOT DETECTED.  The SARS-CoV-2 RNA is generally detectable in upper and lower respiratory specimens during the acute phase of infection. Negative results do not preclude SARS-CoV-2 infection, do not rule out co-infections with other pathogens, and should not be used as the sole basis for treatment or other patient management decisions. Negative results must be combined with clinical observations, patient history, and epidemiological information. The expected result is Negative.  Fact Sheet for Patients: SugarRoll.be  Fact Sheet for Healthcare Providers: https://www.woods-mathews.com/  This test is not yet approved or cleared by the [place] FDA and  has been authorized for detection  and/or diagnosis of SARS-CoV-2 by FDA under an Emergency Use Authorization (EUA). This EUA will remain  in effect (meaning this test can be used) for the duration of the COVID-19 declaration under Se ction 564(b)(1) of the Act, 21 U.S.C. section 360bbb-3(b)(1), unless the authorization is terminated or revoked sooner.  Performed at [facility], [place] [address], [place], [place] [address]    Results for orders placed or performed during the hospital encounter of 02/26/21 (from the past 72 hour(s))  CBC     Status: Abnormal   Collection Time: 02/28/21  4:37 AM  Result  Value Ref Range   WBC 21.8 (H) 4.0 - 10.5 K/uL   RBC 4.03 (L) 4.22 - 5.81 MIL/uL   Hemoglobin 12.6 (L) 13.0 - 17.0 g/dL   HCT 37.0 (L) 39.0 - 52.0 %   MCV 91.8 80.0 - 100.0 fL   MCH 31.3 26.0 - 34.0 pg   MCHC 34.1 30.0 - 36.0 g/dL   RDW 13.8 11.5 - 15.5 %   Platelets 195 150 - 400 K/uL   nRBC 0.0 0.0 - 0.2 %    Comment: Performed at [facility], [address], [place], [place] [address]  Basic metabolic panel     Status: Abnormal   Collection Time: 02/28/21  4:37 AM  Result Value Ref Range   Sodium 136 135 - 145 mmol/L   Potassium 3.7 3.5 - 5.1 mmol/L   Chloride 100 98 - 111 mmol/L   CO2 26 22 - 32 mmol/L   Glucose, Bld 148 (H) 70 - 99 mg/dL    Comment: Glucose reference range applies only to samples taken after fasting for at least 8 hours.   BUN 28 (H) 8 - 23 mg/dL   Creatinine, Ser 1.04 0.61 - 1.24 mg/dL   Calcium 9.5 8.9 - 10.3 mg/dL   GFR, Estimated >60 >60 mL/min    Comment: (NOTE) Calculated using the CKD-EPI Creatinine Equation (2021)    Anion gap 10 5 - 15    Comment: Performed at [facility], [address] [place], [place] [address]  Magnesium     Status: None   Collection Time: 02/28/21  4:37 AM  Result Value Ref Range   Magnesium 2.0 1.7 - 2.4 mg/dL    Comment: Performed at [facility], [address] [place], [place] [address]  CBC with Differential/Platelet     Status: Abnormal   Collection Time: 03/01/21  4:31 AM  Result Value Ref Range   WBC 19.1 (H) 4.0 - 10.5 K/uL   RBC 4.02 (L) 4.22 - 5.81 MIL/uL   Hemoglobin 12.6 (L) 13.0 - 17.0 g/dL   HCT 37.0 (L) 39.0 - 52.0 %   MCV 92.0 80.0 - 100.0 fL   MCH 31.3 26.0 - 34.0 pg   MCHC 34.1 30.0 - 36.0 g/dL   RDW 13.9 11.5 - 15.5 %   Platelets 177 150 - 400 K/uL   nRBC 0.0 0.0 - 0.2 %   Neutrophils Relative % 76 %   Neutro Abs 14.7 (H) 1.7 - 7.7 K/uL   Lymphocytes Relative 17 %   Lymphs Abs 3.2 0.7 - 4.0 K/uL   Monocytes Relative 6 %   Monocytes Absolute 1.1 (H) 0.1 - 1.0  K/uL   Eosinophils Relative 0 %   Eosinophils Absolute 0.0 0.0 - 0.5 K/uL   Basophils Relative 0 %   Basophils Absolute 0.0 0.0 - 0.1 K/uL   Immature Granulocytes 1 %  Abs Immature Granulocytes 0.10 (H) 0.00 - 0.07 K/uL    Comment: Performed at [facility], [address] [place], [place] [address]     Best Practice/Protocols:  VTE Prophylaxis: Lovenox (prophylaxtic dose) GI Prophylaxis: Proton Pump Inhibitor  Studies: Independently reviewed DG Chest 2 View  Result Date: 02/26/2021 CLINICAL DATA:  Chest pain with difficulty breathing for 3 days. EXAM: CHEST - 2 VIEW COMPARISON:  07/23/2019 FINDINGS: Heart size and pulmonary vascularity are normal. Mild linear changes in the lung bases likely represent vascular crowding with slightly shallow inspiration. No airspace disease or consolidation. No pleural effusions. No pneumothorax. Calcification of the aorta. Degenerative changes in the spine. Postoperative changes in the sternum and lumbar spine. IMPRESSION: No active cardiopulmonary disease. Electronically Signed   By: Lucienne Capers M.D.   On: 02/26/2021 22:51   CT ANGIO CHEST PE W OR WO CONTRAST  Result Date: 03/01/2021 CLINICAL DATA:  Shortness of breath EXAM: CT ANGIOGRAPHY CHEST WITH CONTRAST TECHNIQUE: Multidetector CT imaging of the chest was performed using the standard protocol during bolus administration of intravenous contrast. Multiplanar CT image reconstructions and MIPs were obtained to evaluate the vascular anatomy. CONTRAST:  19mL OMNIPAQUE IOHEXOL 350 MG/ML SOLN COMPARISON:  Chest radiograph and chest CT February 26, 2021 FINDINGS: Cardiovascular: There is no demonstrable pulmonary embolus. There is no thoracic aortic aneurysm. No dissection evident. Note that the contrast bolus in the aorta is somewhat less than optimal for dissection assessment. There are scattered foci of calcification in visualized great vessels. There is aortic atherosclerosis as well as foci of  coronary artery calcification. There is no pericardial effusion or pericardial thickening. Mediastinum/Nodes: Visualized thyroid appears unremarkable. No appreciable thoracic adenopathy. No esophageal lesions are appreciable. Lungs/Pleura: There is again noted centrilobular and paraseptal emphysematous change. There is atelectatic change in each lung base. There is no appreciable edema or consolidation. Stable scattered 1-2 mm nodular opacities. No evident pleural effusion. Trachea and major bronchial structures appear normal. No evident pneumothorax. Upper Abdomen: There is upper abdominal aortic atherosclerosis. Gallbladder absent. Visualized upper abdominal structures otherwise appear normal. Musculoskeletal: Status post median sternotomy degenerative change in thoracic spine. Incomplete visualization of postoperative change in lower thoracic/upper lumbar region. No blastic or lytic bone lesions. No evident chest wall lesions. Review of the MIP images confirms the above findings. IMPRESSION: 1. No demonstrable pulmonary embolus. No thoracic aortic aneurysm or dissection. There is aortic atherosclerosis as well as foci of great vessel and coronary artery calcification. 2. Bibasilar atelectasis. No edema or airspace opacity. Underlying emphysematous changes noted. 3.  No evident adenopathy. 4.  Gallbladder absent. Aortic Atherosclerosis (ICD10-I70.0) and Emphysema (ICD10-J43.9). Electronically Signed   By: Lowella Grip III M.D.   On: 03/01/2021 11:42   CT CHEST LCS NODULE F/U W/O CONTRAST  Result Date: 02/27/2021 CLINICAL DATA:  65 year old male former smoker (quit November 2021) with 96 pack-year history of smoking. Follow-up for prior abnormal low-dose lung cancer screening examination. EXAM: CT CHEST WITHOUT CONTRAST FOR LUNG CANCER SCREENING NODULE FOLLOW-UP TECHNIQUE: Multidetector CT imaging of the chest was performed following the standard protocol without IV contrast. COMPARISON:  Low-dose lung  cancer screening chest CT 11/06/2020. FINDINGS: Cardiovascular: Heart size is normal. There is no significant pericardial fluid, thickening or pericardial calcification. There is aortic atherosclerosis, as well as atherosclerosis of the great vessels of the mediastinum and the coronary arteries, including calcified atherosclerotic plaque in the left main, left anterior descending, left circumflex and right coronary arteries. Mediastinum/Nodes: No pathologically enlarged mediastinal or  hilar lymph nodes. Please note that accurate exclusion of hilar adenopathy is limited on noncontrast CT scans. Esophagus is unremarkable in appearance. No axillary lymphadenopathy. Lungs/Pleura: Multiple small pulmonary nodules are again noted throughout the lungs bilaterally, generally stable compared to prior examinations. Several lobe the larger nodules seen on the prior study that were of concern have either completely resolved or have decreased in size compared to prior examinations. No larger more suspicious appearing pulmonary nodules or masses are noted. No acute consolidative airspace disease. No pleural effusions. Mild diffuse bronchial wall thickening with mild centrilobular and paraseptal emphysema. Upper Abdomen: Aortic atherosclerosis. Diffuse low attenuation throughout the visualized hepatic parenchyma, indicative of hepatic steatosis. Musculoskeletal: There are no aggressive appearing lytic or blastic lesions noted in the visualized portions of the skeleton. IMPRESSION: 1. Lung-RADS 2S, benign appearance or behavior. Continue annual screening with low-dose chest CT without contrast in 12 months. 2. The "S" modifier above refers to potentially clinically significant non lung cancer related findings. Specifically, there is aortic atherosclerosis, in addition to left main and 3 vessel coronary artery disease. Please note that although the presence of coronary artery calcium documents the presence of coronary artery  disease, the severity of this disease and any potential stenosis cannot be assessed on this non-gated CT examination. Assessment for potential risk factor modification, dietary therapy or pharmacologic therapy may be warranted, if clinically indicated. 3. Mild diffuse bronchial wall thickening with mild centrilobular and paraseptal emphysema; imaging findings suggestive of underlying COPD. 4. Hepatic steatosis. Aortic Atherosclerosis (ICD10-I70.0) and Emphysema (ICD10-J43.9). Electronically Signed   By: Vinnie Langton M.D.   On: 02/27/2021 14:44   ECHOCARDIOGRAM COMPLETE  Result Date: 03/02/2021    ECHOCARDIOGRAM REPORT   Patient Name:   Bradley Sawyer Date of Exam: 03/02/2021 Medical Rec #:  [id number]        Height:       71.0 in Accession #:    [id number]       Weight:       223.5 lb Date of Birth:  1956/04/03        BSA:          2.211 m Patient Age:    73 years         BP:           136/71 mmHg Patient Gender: M                HR:           53 bpm. Exam Location:  [facility] Procedure: 2D Echo, Cardiac Doppler, Color Doppler and Intracardiac            Opacification Agent Indications:     Dyspnea 06.00  History:         Patient has no prior history of Echocardiogram examinations.                  COPD; Risk Factors:Hypertension.  Sonographer:     Sherrie Sport RDCS (AE) Referring Phys:  [id number] Barb Merino Diagnosing Phys: Nelva Bush MD  Sonographer Comments: Suboptimal parasternal window and suboptimal apical window. Image acquisition challenging due to COPD. IMPRESSIONS  1. Left ventricular ejection fraction, by estimation, is 50 to 55%. The left ventricle has low normal function. Left ventricular endocardial border not optimally defined to evaluate regional wall motion. The left ventricular internal cavity size was mildly dilated. Left ventricular diastolic parameters are consistent with Grade II diastolic dysfunction (pseudonormalization). Elevated left atrial pressure.  2. Right ventricular systolic  function is normal. The right ventricular size is normal. Tricuspid regurgitation signal is inadequate for assessing PA pressure.  3. Left atrial size was mildly dilated.  4. Right atrial size was mildly dilated.  5. The mitral valve was not well visualized. Trivial mitral valve regurgitation. No evidence of mitral stenosis.  6. The aortic valve was not well visualized. Aortic valve regurgitation is not visualized. No aortic stenosis is present. FINDINGS  Left Ventricle: Left ventricular ejection fraction, by estimation, is 50 to 55%. The left ventricle has low normal function. Left ventricular endocardial border not optimally defined to evaluate regional wall motion. Definity contrast agent was given IV  to delineate the left ventricular endocardial borders. The left ventricular internal cavity size was mildly dilated. There is borderline left ventricular hypertrophy. Left ventricular diastolic parameters are consistent with Grade II diastolic dysfunction (pseudonormalization). Elevated left atrial pressure. Right Ventricle: The right ventricular size is normal. No increase in right ventricular wall thickness. Right ventricular systolic function is normal. Tricuspid regurgitation signal is inadequate for assessing PA pressure. Left Atrium: Left atrial size was mildly dilated. Right Atrium: Right atrial size was mildly dilated. Pericardium: There is no evidence of pericardial effusion. Mitral Valve: The mitral valve was not well visualized. Trivial mitral valve regurgitation. No evidence of mitral valve stenosis. Tricuspid Valve: The tricuspid valve is not well visualized. Tricuspid valve regurgitation is trivial. Aortic Valve: The aortic valve was not well visualized. Aortic valve regurgitation is not visualized. No aortic stenosis is present. Aortic valve mean gradient measures 2.0 mmHg. Aortic valve peak gradient measures 4.4 mmHg. Aortic valve area, by VTI measures 2.96 cm. Pulmonic Valve: The pulmonic valve  was not well visualized. Pulmonic valve regurgitation is not visualized. No evidence of pulmonic stenosis. Aorta: The aortic root is normal in size and structure. Pulmonary Artery: The pulmonary artery is not well seen. Venous: The inferior vena cava was not well visualized. IAS/Shunts: The interatrial septum appears to be lipomatous. No atrial level shunt detected by color flow Doppler.  LEFT VENTRICLE PLAX 2D LVIDd:         5.77 cm  Diastology LVIDs:         3.96 cm  LV e' medial:    3.92 cm/s LV PW:         1.05 cm  LV E/e' medial:  19.6 LV IVS:        0.89 cm  LV e' lateral:   6.96 cm/s LVOT diam:     2.20 cm  LV E/e' lateral: 11.0 LV SV:         68 LV SV Index:   31 LVOT Area:     3.80 cm  RIGHT VENTRICLE RV Basal diam:  3.98 cm RV S prime:     11.40 cm/s TAPSE (M-mode): 3.7 cm LEFT ATRIUM           Index       RIGHT ATRIUM           Index LA diam:      3.50 cm 1.58 cm/m  RA Area:     18.10 cm LA Vol (A2C): 87.9 ml 39.76 ml/m RA Volume:   51.70 ml  23.39 ml/m LA Vol (A4C): 35.6 ml 16.10 ml/m  AORTIC VALVE                   PULMONIC VALVE AV Area (Vmax):    3.31 cm    PV Vmax:        0.57 m/s  AV Area (Vmean):   3.10 cm    PV Peak grad:   1.3 mmHg AV Area (VTI):     2.96 cm    RVOT Peak grad: 3 mmHg AV Vmax:           105.00 cm/s AV Vmean:          69.200 cm/s AV VTI:            0.230 m AV Peak Grad:      4.4 mmHg AV Mean Grad:      2.0 mmHg LVOT Vmax:         91.40 cm/s LVOT Vmean:        56.500 cm/s LVOT VTI:          0.179 m LVOT/AV VTI ratio: 0.78  AORTA Ao Root diam: 2.90 cm MITRAL VALVE MV Area (PHT): 3.36 cm    SHUNTS MV Decel Time: 226 msec    Systemic VTI:  0.18 m MV E velocity: 76.70 cm/s  Systemic Diam: 2.20 cm MV A velocity: 60.40 cm/s MV E/A ratio:  1.27 Nelva Bush MD Electronically signed by Nelva Bush MD Signature Date/Time: 03/02/2021/9:47:44 AM    Final     Consults: Treatment Team:  Tyler Pita, MD   Subjective:    Overnight Issues: Slept better last night.   Feeling less short of breath.  Still somewhat winded when walking but getting better.  Wants to go home but realizes may "bounce back" if he does. Had CT angio yesterday pulmonary embolus.  No evidence of pneumonia.  Changes of COPD.  Objective:  Vital signs for last 24 hours: Temp:  [97.7 F (36.5 C)-97.9 F (36.6 C)] 97.7 F (36.5 C) (04/11 1652) Pulse Rate:  [48-66] 66 (04/11 1652) Resp:  [16-17] 17 (04/11 1652) BP: (114-136)/(66-79) 122/75 (04/11 1652) SpO2:  [90 %-95 %] 95 % (04/11 1652) Weight:  [101.4 kg] 101.4 kg (04/11 0500)  Intake/Output from previous day: 04/10 0701 - 04/11 0700 In: 1320 [P.O.:1320] Out: -   Intake/Output this shift: Total I/O In: 600 [P.O.:600] Out: 0   SpO2: 95 % O2 Flow Rate (L/min): 3 L/min  Physical Exam:  GENERAL: Disheveled appearing gentleman, looks older than stated age.  No conversational dyspnea.  Comfortable on nasal cannula O2.  HEAD: Normocephalic, atraumatic.  EYES: Pupils equal, round, reactive to light.  No scleral icterus.  MOUTH: Poor dentition, oral mucosa moist. NECK: Supple. No thyromegaly. Trachea midline. No JVD.  No adenopathy. PULMONARY:  Mild bilateral end expiratory wheezes, improved air entry bilaterally. CARDIOVASCULAR: S1 and S2. Regular rate and rhythm.  No rubs, murmurs or gallops heard. ABDOMEN: Protuberant, soft, no distention. MUSCULOSKELETAL: No joint deformity, no clubbing, no edema.  NEUROLOGIC: No focal deficit, speech fluent.   SKIN: Intact,warm,dry.  No rashes. PSYCH: Mood and behavior normal  Assessment/Plan:  Acute exacerbation of COPD Trigger may have been bronchitis early on No evidence of remaining infection Optimize nebulization treatments Continue Brovana and Pulmicort twice a day and Yupelri once a day As needed albuterol for breakthrough shortness of breath Continue pulmonary hygiene Flutter valve use encouraged  COPD GOLD stage IV Tenacious secretions Nebulization treatments as  above Recently discontinued smoking Add expectorant Continue nebulization treatments as above   Acute on chronic respiratory failure with hypoxia Titrate O2 for saturations of 88 to 92% Further O2 titration can be performed as an outpatient Patient's DME company is [employer]   Recommendations for upon discharge:  Brovana 15 mcg twice daily via nebulizer  Pulmicort 0.5 mg twice a day via nebulizer Yupelri 175 mcg once daily via nebulizer  Have placed consult for transition of care consult to see these medications can be obtained for the patient.  He has a follow-up visit with me on 30 Mar 2021 11 AM.  We will sign off please reconsult as needed.    LOS: 3 days    C. Derrill Kay, MD [facility] 03/02/2021  *This note was dictated using voice recognition software/Dragon.  Despite best efforts to proofread, errors can occur which can change the meaning.  Any change was purely unintentional.

## 2021-03-02 NOTE — Progress Notes (Signed)
*  PRELIMINARY RESULTS* Echocardiogram 2D Echocardiogram has been performed.  Sherrie Sport 03/02/2021, 8:50 AM

## 2021-03-02 NOTE — Telephone Encounter (Signed)
Lm x1 for patient.  

## 2021-03-02 NOTE — Progress Notes (Signed)
PROGRESS NOTE    Bradley Sawyer  EVO:[id number] DOB: [birth year] DOA: 02/26/2021 PCP: Curly Rim, MD    Brief Narrative:  Bradley Sawyer is a 65 year old male with past medical history significant for COPD on home O2 2-3 L nightly, former heavy tobacco use, chronic low back pain on chronic high-dose opioids, CAD, history of CVA, who presented to the emergency department on 02/26/2021 with 3-day history of progressive shortness of breath.  Patient reports has been utilizing his O2 throughout the day in addition to his normal nightly use.  He has a chronic cough but denies fever/chills.  No nausea/vomiting or diaphoresis.  In the ED, temperature 97.8 F, HR 59, RR 20, BP 143/118, SPO2 94% on 2 L nasal cannula.  Sodium 139, potassium 3.4, chloride 101, CO2 28, glucose 120, BUN 10, creatinine 0.96.  BNP 16.8, high sensitive troponin 32>32.  WBC 9.8, hemoglobin 12.5, platelets 194.  Chest x-ray with no acute cardiopulmonary disease process.  Patient was started on duo nebs, Solu-Medrol and IV furosemide 20 mg by ED provider.  Hospitalist service consulted for further evaluation and management of acute hypoxic respiratory failure secondary to COPD exacerbation.   Assessment & Plan:   Principal Problem:   COPD exacerbation ([place]) Active Problems:   Essential hypertension   CAD (coronary artery disease)   PAD (peripheral artery disease) (HCC)   Elevated troponin   History of CVA (cerebrovascular accident)   Chronic pain, radicular   Chronic, continuous use of opioids   Acute on chronic hypoxic respiratory failure, POA COPD exacerbation Patient presenting to the ED with progressive shortness of breath over the past 3 days and requiring around-the-clock supplemental oxygen (usually only on qHS).  Chest x-ray with no acute cardiopulmonary disease process.  Patient follows with pulmonology outpatient, Dr. Mortimer Fries.  Patient reports compliance with his home medication regimen. --Azithromycin 500  mg p.o. daily 3/5 days --Solu-Medrol 40 mg IV every 6 hours x1 day followed by prednisone 40 mg p.o. daily to complete 5-day course --DuoNeb every 4 hours as needed wheezing/shortness of breath --scheduled bronchodilator therapy due to significant symptoms. --Will evaluate for supplemental oxygen need when ready to go home.  Patient will need supplemental oxygen around-the-clock. --Aggressive chest physiotherapy.  Continue mobilizing in the hallway. --Due to significant symptoms, a repeat CT scan of the chest today shows no evidence of PE, no consolidation.  Extensive emphysematous changes. Seen by patient's pulmonary provider.  Elevated troponin, CAD Troponin was slightly elevated at 32, followed by 32; flat.  Likely secondary to demand ischemia from exacerbation of his underlying COPD disease.  Patient follows with cardiology outpatient.  Scheduled to have elective cardiac catheterization upcoming. --Continue aspirin, atorvastatin, clopidogrel, Imdur  --Outpatient follow-up with cardiology --Currently chest pain-free.  2D echocardiogram essentially unchanged.  No evidence of acute coronary syndrome.  Patient is planned for elective cardiac cath.  Current symptoms are unlikely because of acute coronary syndrome.  Essential hypertension Blood pressure well controlled. --Continue amlodipine 5 mg p.o. daily, lisinopril 20 mg p.o. daily, HCTZ 12.5 mg p.o. daily  PAD History of CVA --Continue Plavix, aspirin and statin  HLD: Atorvastatin 80 mg p.o. daily  Chronic back pain Chronic opioid dependence --Meloxicam 15 mg p.o. daily --Flexeril 10 mg p.o. daily --Continue oxycodone IR 15 mg 6x daily --Outpatient follow-up with pain management  DVT prophylaxis: Lovenox   Code Status: Prior Family Communication: None present at bedside.  Patient is communicating.  Disposition Plan:  Level of care: Progressive Cardiac Status is:  Inpatient  Remains inpatient appropriate because:Ongoing  diagnostic testing needed not appropriate for outpatient work up, Unsafe d/c plan, IV treatments appropriate due to intensity of illness or inability to take PO and Inpatient level of care appropriate due to severity of illness   Dispo: The patient is from: Home              Anticipated d/c is to: Home              Patient currently is not medically stable to d/c.   Difficult to place patient No   Consultants:   Pulmonary  Procedures:   None  Antimicrobials:   Azithromycin 4/8>>   Subjective: Patient seen and examined.  At rest he has no complaints.  Today, he was able to ambulate with minimal shortness of breath.  Patient still has subjective feeling of dyspnea on minimal ambulation. His medications were changed last night, will see response and monitor him next 24 hours and patient is agreeable.   Objective: Vitals:   03/02/21 0417 03/02/21 0500 03/02/21 0751 03/02/21 1222  BP: 127/79  136/71   Pulse: (!) 48  (!) 53   Resp: 17     Temp: 97.9 F (36.6 C)  97.8 F (36.6 C)   TempSrc:      SpO2: 95%  95% 94%  Weight:  101.4 kg    Height:        Intake/Output Summary (Last 24 hours) at 03/02/2021 1421 Last data filed at 03/02/2021 1330 Gross per 24 hour  Intake 1440 ml  Output 0 ml  Net 1440 ml   Filed Weights   02/26/21 2222 02/27/21 1320 03/02/21 0500  Weight: 106.6 kg 101.9 kg 101.4 kg    Examination:  General exam: Appears calm and comfortable, appears older than stated age Mostly comfortable at rest.  Moderate distress on mobility. Respiratory system: Moderate distress and anxious on mobility.  Able to mobilize on 3 L oxygen. Cardiovascular system: S1 & S2 heard, RRR. No JVD, murmurs, rubs, gallops or clicks. No pedal edema. Gastrointestinal system: Abdomen is nondistended, soft and nontender. No organomegaly or masses felt. Normal bowel sounds heard. Central nervous system: Alert and oriented. No focal neurological deficits. Extremities: Symmetric 5 x 5  power. Skin: No rashes, lesions or ulcers Psychiatry: Judgement and insight appear normal. Mood & affect appropriate.     Data Reviewed: I have personally reviewed following labs and imaging studies  CBC: Recent Labs  Lab 02/26/21 2225 02/27/21 1342 02/28/21 0437 03/01/21 0431  WBC 9.8 11.8* 21.8* 19.1*  NEUTROABS 6.5  --   --  14.7*  HGB 12.5* 12.7* 12.6* 12.6*  HCT 38.2* 37.5* 37.0* 37.0*  MCV 93.6 91.5 91.8 92.0  PLT 194 194 195 735   Basic Metabolic Panel: Recent Labs  Lab 02/26/21 2225 02/27/21 1342 02/28/21 0437  NA 139  --  136  K 3.4*  --  3.7  CL 101  --  100  CO2 28  --  26  GLUCOSE 120*  --  148*  BUN 10  --  28*  CREATININE 0.96 0.89 1.04  CALCIUM 9.4  --  9.5  MG  --   --  2.0   GFR: Estimated Creatinine Clearance: 87 mL/min (by C-G formula based on SCr of 1.04 mg/dL). Liver Function Tests: No results for input(s): AST, ALT, ALKPHOS, BILITOT, PROT, ALBUMIN in the last 168 hours. No results for input(s): LIPASE, AMYLASE in the last 168 hours. No results for input(s): AMMONIA  in the last 168 hours. Coagulation Profile: No results for input(s): INR, PROTIME in the last 168 hours. Cardiac Enzymes: No results for input(s): CKTOTAL, CKMB, CKMBINDEX, TROPONINI in the last 168 hours. BNP (last 3 results) No results for input(s): PROBNP in the last 8760 hours. HbA1C: No results for input(s): HGBA1C in the last 72 hours. CBG: No results for input(s): GLUCAP in the last 168 hours. Lipid Profile: No results for input(s): CHOL, HDL, LDLCALC, TRIG, CHOLHDL, LDLDIRECT in the last 72 hours. Thyroid Function Tests: No results for input(s): TSH, T4TOTAL, FREET4, T3FREE, THYROIDAB in the last 72 hours. Anemia Panel: No results for input(s): VITAMINB12, FOLATE, FERRITIN, TIBC, IRON, RETICCTPCT in the last 72 hours. Sepsis Labs: No results for input(s): PROCALCITON, LATICACIDVEN in the last 168 hours.  Recent Results (from the past 240 hour(s))  SARS  CORONAVIRUS 2 (TAT 6-24 HRS) Nasopharyngeal Nasopharyngeal Swab     Status: None   Collection Time: 02/27/21  3:32 AM   Specimen: Nasopharyngeal Swab  Result Value Ref Range Status   SARS Coronavirus 2 NEGATIVE NEGATIVE Final    Comment: (NOTE) SARS-CoV-2 target nucleic acids are NOT DETECTED.  The SARS-CoV-2 RNA is generally detectable in upper and lower respiratory specimens during the acute phase of infection. Negative results do not preclude SARS-CoV-2 infection, do not rule out co-infections with other pathogens, and should not be used as the sole basis for treatment or other patient management decisions. Negative results must be combined with clinical observations, patient history, and epidemiological information. The expected result is Negative.  Fact Sheet for Patients: SugarRoll.be  Fact Sheet for Healthcare Providers: https://www.woods-mathews.com/  This test is not yet approved or cleared by the [place] FDA and  has been authorized for detection and/or diagnosis of SARS-CoV-2 by FDA under an Emergency Use Authorization (EUA). This EUA will remain  in effect (meaning this test can be used) for the duration of the COVID-19 declaration under Se ction 564(b)(1) of the Act, 21 U.S.C. section 360bbb-3(b)(1), unless the authorization is terminated or revoked sooner.  Performed at [facility], [place] [address] [place], [place] [address]          Radiology Studies: CT ANGIO CHEST PE W OR WO CONTRAST  Result Date: 03/01/2021 CLINICAL DATA:  Shortness of breath EXAM: CT ANGIOGRAPHY CHEST WITH CONTRAST TECHNIQUE: Multidetector CT imaging of the chest was performed using the standard protocol during bolus administration of intravenous contrast. Multiplanar CT image reconstructions and MIPs were obtained to evaluate the vascular anatomy. CONTRAST:  18mL OMNIPAQUE IOHEXOL 350 MG/ML SOLN COMPARISON:  Chest radiograph and chest  CT February 26, 2021 FINDINGS: Cardiovascular: There is no demonstrable pulmonary embolus. There is no thoracic aortic aneurysm. No dissection evident. Note that the contrast bolus in the aorta is somewhat less than optimal for dissection assessment. There are scattered foci of calcification in visualized great vessels. There is aortic atherosclerosis as well as foci of coronary artery calcification. There is no pericardial effusion or pericardial thickening. Mediastinum/Nodes: Visualized thyroid appears unremarkable. No appreciable thoracic adenopathy. No esophageal lesions are appreciable. Lungs/Pleura: There is again noted centrilobular and paraseptal emphysematous change. There is atelectatic change in each lung base. There is no appreciable edema or consolidation. Stable scattered 1-2 mm nodular opacities. No evident pleural effusion. Trachea and major bronchial structures appear normal. No evident pneumothorax. Upper Abdomen: There is upper abdominal aortic atherosclerosis. Gallbladder absent. Visualized upper abdominal structures otherwise appear normal. Musculoskeletal: Status post median sternotomy degenerative change in thoracic spine. Incomplete  visualization of postoperative change in lower thoracic/upper lumbar region. No blastic or lytic bone lesions. No evident chest wall lesions. Review of the MIP images confirms the above findings. IMPRESSION: 1. No demonstrable pulmonary embolus. No thoracic aortic aneurysm or dissection. There is aortic atherosclerosis as well as foci of great vessel and coronary artery calcification. 2. Bibasilar atelectasis. No edema or airspace opacity. Underlying emphysematous changes noted. 3.  No evident adenopathy. 4.  Gallbladder absent. Aortic Atherosclerosis (ICD10-I70.0) and Emphysema (ICD10-J43.9). Electronically Signed   By: Lowella Grip III M.D.   On: 03/01/2021 11:42   ECHOCARDIOGRAM COMPLETE  Result Date: 03/02/2021    ECHOCARDIOGRAM REPORT   Patient Name:    Bradley Sawyer Date of Exam: 03/02/2021 Medical Rec #:  [id number]        Height:       71.0 in Accession #:    [id number]       Weight:       223.5 lb Date of Birth:  [birth year]        BSA:          2.211 m Patient Age:    60 years         BP:           136/71 mmHg Patient Gender: M                HR:           53 bpm. Exam Location:  [facility] Procedure: 2D Echo, Cardiac Doppler, Color Doppler and Intracardiac            Opacification Agent Indications:     Dyspnea 06.00  History:         Patient has no prior history of Echocardiogram examinations.                  COPD; Risk Factors:Hypertension.  Sonographer:     Sherrie Sport RDCS (AE) Referring Phys:  [id number] Barb Merino Diagnosing Phys: Nelva Bush MD  Sonographer Comments: Suboptimal parasternal window and suboptimal apical window. Image acquisition challenging due to COPD. IMPRESSIONS  1. Left ventricular ejection fraction, by estimation, is 50 to 55%. The left ventricle has low normal function. Left ventricular endocardial border not optimally defined to evaluate regional wall motion. The left ventricular internal cavity size was mildly dilated. Left ventricular diastolic parameters are consistent with Grade II diastolic dysfunction (pseudonormalization). Elevated left atrial pressure.  2. Right ventricular systolic function is normal. The right ventricular size is normal. Tricuspid regurgitation signal is inadequate for assessing PA pressure.  3. Left atrial size was mildly dilated.  4. Right atrial size was mildly dilated.  5. The mitral valve was not well visualized. Trivial mitral valve regurgitation. No evidence of mitral stenosis.  6. The aortic valve was not well visualized. Aortic valve regurgitation is not visualized. No aortic stenosis is present. FINDINGS  Left Ventricle: Left ventricular ejection fraction, by estimation, is 50 to 55%. The left ventricle has low normal function. Left ventricular endocardial border not optimally defined to evaluate  regional wall motion. Definity contrast agent was given IV  to delineate the left ventricular endocardial borders. The left ventricular internal cavity size was mildly dilated. There is borderline left ventricular hypertrophy. Left ventricular diastolic parameters are consistent with Grade II diastolic dysfunction (pseudonormalization). Elevated left atrial pressure. Right Ventricle: The right ventricular size is normal. No increase in right ventricular wall thickness. Right ventricular systolic function is normal. Tricuspid regurgitation signal is inadequate for assessing PA  pressure. Left Atrium: Left atrial size was mildly dilated. Right Atrium: Right atrial size was mildly dilated. Pericardium: There is no evidence of pericardial effusion. Mitral Valve: The mitral valve was not well visualized. Trivial mitral valve regurgitation. No evidence of mitral valve stenosis. Tricuspid Valve: The tricuspid valve is not well visualized. Tricuspid valve regurgitation is trivial. Aortic Valve: The aortic valve was not well visualized. Aortic valve regurgitation is not visualized. No aortic stenosis is present. Aortic valve mean gradient measures 2.0 mmHg. Aortic valve peak gradient measures 4.4 mmHg. Aortic valve area, by VTI measures 2.96 cm. Pulmonic Valve: The pulmonic valve was not well visualized. Pulmonic valve regurgitation is not visualized. No evidence of pulmonic stenosis. Aorta: The aortic root is normal in size and structure. Pulmonary Artery: The pulmonary artery is not well seen. Venous: The inferior vena cava was not well visualized. IAS/Shunts: The interatrial septum appears to be lipomatous. No atrial level shunt detected by color flow Doppler.  LEFT VENTRICLE PLAX 2D LVIDd:         5.77 cm  Diastology LVIDs:         3.96 cm  LV e' medial:    3.92 cm/s LV PW:         1.05 cm  LV E/e' medial:  19.6 LV IVS:        0.89 cm  LV e' lateral:   6.96 cm/s LVOT diam:     2.20 cm  LV E/e' lateral: 11.0 LV SV:          68 LV SV Index:   31 LVOT Area:     3.80 cm  RIGHT VENTRICLE RV Basal diam:  3.98 cm RV S prime:     11.40 cm/s TAPSE (M-mode): 3.7 cm LEFT ATRIUM           Index       RIGHT ATRIUM           Index LA diam:      3.50 cm 1.58 cm/m  RA Area:     18.10 cm LA Vol (A2C): 87.9 ml 39.76 ml/m RA Volume:   51.70 ml  23.39 ml/m LA Vol (A4C): 35.6 ml 16.10 ml/m  AORTIC VALVE                   PULMONIC VALVE AV Area (Vmax):    3.31 cm    PV Vmax:        0.57 m/s AV Area (Vmean):   3.10 cm    PV Peak grad:   1.3 mmHg AV Area (VTI):     2.96 cm    RVOT Peak grad: 3 mmHg AV Vmax:           105.00 cm/s AV Vmean:          69.200 cm/s AV VTI:            0.230 m AV Peak Grad:      4.4 mmHg AV Mean Grad:      2.0 mmHg LVOT Vmax:         91.40 cm/s LVOT Vmean:        56.500 cm/s LVOT VTI:          0.179 m LVOT/AV VTI ratio: 0.78  AORTA Ao Root diam: 2.90 cm MITRAL VALVE MV Area (PHT): 3.36 cm    SHUNTS MV Decel Time: 226 msec    Systemic VTI:  0.18 m MV E velocity: 76.70 cm/s  Systemic Diam: 2.20 cm MV A  velocity: 60.40 cm/s MV E/A ratio:  1.27 Nelva Bush MD Electronically signed by Nelva Bush MD Signature Date/Time: 03/02/2021/9:47:44 AM    Final         Scheduled Meds: . amLODipine  5 mg Oral Daily  . arformoterol  15 mcg Nebulization BID  . aspirin EC  81 mg Oral Daily  . atorvastatin  80 mg Oral Daily  . azithromycin  500 mg Oral Daily  . budesonide (PULMICORT) nebulizer solution  0.25 mg Nebulization BID  . clopidogrel  75 mg Oral Daily  . cyclobenzaprine  10 mg Oral Daily  . enoxaparin (LOVENOX) injection  0.5 mg/kg Subcutaneous Q24H  . feeding supplement  237 mL Oral TID BM  . FLUoxetine  10 mg Oral Daily  . lisinopril  20 mg Oral Daily   And  . hydrochlorothiazide  12.5 mg Oral Daily  . isosorbide mononitrate  30 mg Oral Daily  . linaclotide  290 mcg Oral QAC breakfast  . loratadine  5 mg Oral QHS  . mouth rinse  15 mL Mouth Rinse BID  . meloxicam  15 mg Oral Daily  . montelukast   10 mg Oral Daily  . multivitamin with minerals  1 tablet Oral Daily  . pantoprazole  40 mg Oral Daily  . predniSONE  40 mg Oral Q breakfast  . pregabalin  150 mg Oral Daily  . revefenacin  175 mcg Nebulization Daily  . rOPINIRole  1 mg Oral QHS  . tamsulosin  0.4 mg Oral Daily   Continuous Infusions:   LOS: 3 days    Time spent: 32 minutes    Barb Merino, MD [facility] Available via Epic secure chat 7am-7pm After these hours, please refer to coverage provider listed on [email or web address] 03/02/2021, 2:21 PM

## 2021-03-02 NOTE — Progress Notes (Signed)
Pt ambulated around nurses station on 3L [place]. o2 sats dropped from 93% to 88% while ambulating on 3L. No distress noticed. Will continue to monitor.

## 2021-03-03 DIAGNOSIS — J441 Chronic obstructive pulmonary disease with (acute) exacerbation: Secondary | ICD-10-CM | POA: Diagnosis not present

## 2021-03-03 MED ORDER — BUDESONIDE 0.25 MG/2ML IN SUSP
0.2500 mg | Freq: Two times a day (BID) | RESPIRATORY_TRACT | 0 refills | Status: DC
Start: 2021-03-03 — End: 2021-03-30

## 2021-03-03 MED ORDER — REVEFENACIN 175 MCG/3ML IN SOLN: 175.0000 ug | Freq: Every day | RESPIRATORY_TRACT | 0 refills | Status: DC

## 2021-03-03 MED ORDER — PREDNISONE 20 MG PO TABS: 20.0000 mg | ORAL_TABLET | Freq: Every day | ORAL | 0 refills | Status: AC

## 2021-03-03 MED ORDER — ARFORMOTEROL TARTRATE 15 MCG/2ML IN NEBU: 15.0000 ug | INHALATION_SOLUTION | Freq: Two times a day (BID) | RESPIRATORY_TRACT | 0 refills | Status: DC

## 2021-03-03 NOTE — TOC Transition Note (Signed)
Transition of Care [facility]) - CM/SW Discharge Note   Patient Details  Name: Bradley Sawyer MRN: [id number] Date of Birth: [birth year]  [facility] CM/SW Contact:  Kerin Salen, RN Phone Number: 03/03/2021, 1:29 PM   Clinical Narrative:  Patient to be discharged today, no TOC needs. States his son will pick him up to transport home.           Patient Goals and CMS Choice        Discharge Placement                       Discharge Plan and Services                                     Social Determinants of Health (SDOH) Interventions     Readmission Risk Interventions No flowsheet data found.

## 2021-03-03 NOTE — Progress Notes (Signed)
Pt education and discharge documents given to pt at bedside.

## 2021-03-03 NOTE — Progress Notes (Signed)
Pt alert and oriented x 4. Pain 8/10. Pain meds administered. No complaints of shortness of breath. All pts needs met at this time. Call light within reach.

## 2021-03-03 NOTE — Progress Notes (Addendum)
SATURATION QUALIFICATIONS: (This note is used to comply with regulatory documentation for home oxygen)  Patient Saturations on Room Air at Rest = 89%  Patient Saturations on Room Air while Ambulating = 88%  Patient Saturations on 3 Liters of oxygen while Ambulating = 93%  Please briefly explain why patient needs home oxygen:  Pt unable to maintain O2 sats above 88% without supplemental oxygen.

## 2021-03-03 NOTE — Discharge Summary (Addendum)
Bradley Sawyer WUJ:[id number] DOB: 01-20-1956 DOA: 02/26/2021  PCP: Bradley Rim, MD  Admit date: 02/26/2021 Discharge date: 03/03/2021  Admitted From: home Disposition:  home  Recommendations for Outpatient Follow-up:  1. Follow up with PCP in 1 week 2. Please obtain BMP/CBC in one week 3. Please follow up  Pulmonary in one week 4. F/u with cardiology in one week     Discharge Condition:Stable CODE STATUS:full  Diet recommendation: Heart Healthy / Carb Modified   Brief/Interim Summary: Per Bradley Sawyer is a 65 y.o. male with medical history significant for COPD on nighttime O2 at 2-3 L, former heavy smoker, chronic back pain on chronic high dose opioids, CAD, history of CVA, and who was scheduled for elective cardiac cath, postponed due to emergency with his cardiologist who presented to the emergency room with a 3-day history of increased shortness of breath with need to use O2 throughout the day instead of just at night.  He has a chronic cough and denieed fever or chills.  He has been treated with nebulized bronchodilators, steroids, and pulmonary hygiene.  He is now requiring oxygen even during the day and with ambulation. CT chest negative for PE , no pneumonia, has atelectasis and underlying emphysematous changes.   Acute on chronic hypoxic respiratory failure, POA COPD exacerbation Started on, IV steroids PCCM was following CT scan as below Recommendations for upon discharge from PCCM: Brovana 15 mcg twice daily via nebulizer Pulmicort 0.5 mg twice a day via nebulizer Yupelri 175 mcg once daily via nebulizer   Elevated troponin-mildly elevated.  Likely due to demand ischemia CAD Continue home meds F/u with cardiology for elective cardiac cath. Echo done here EF 50 to 55%.  LV endocardial border not optimally defined to [facility] wall motion .   Essential hypertension Stable continue home dose  PAD History of CVA --Continue Plavix, aspirin  and statin  HLD:  Continue statin  Chronic back pain Chronic opioid dependence --Meloxicam 15 mg p.o. daily --Continue oxycodone IR 15 mg 6x daily --Outpatient follow-up with pain management  Discharge Diagnoses:  Principal Problem:   COPD exacerbation ([place]) Active Problems:   Essential hypertension   CAD (coronary artery disease)   PAD (peripheral artery disease) ([facility])   Elevated troponin   History of CVA (cerebrovascular accident)   Chronic pain, radicular   Chronic, continuous use of opioids    Discharge Instructions  Discharge Instructions    Call MD for:  difficulty breathing, headache or visual disturbances   Complete by: As directed    Diet - low sodium heart healthy   Complete by: As directed    Increase activity slowly   Complete by: As directed      Allergies as of 03/03/2021      Reactions   Bee Venom Swelling      Medication List    STOP taking these medications   Advair Diskus 500-50 MCG/DOSE Aepb Generic drug: Fluticasone-Salmeterol   Breztri Aerosphere 160-9-4.8 MCG/ACT Aero Generic drug: Budeson-Glycopyrrol-Formoterol   cyclobenzaprine 10 MG tablet Commonly known as: FLEXERIL   meloxicam 15 MG tablet Commonly known as: MOBIC   Spiriva HandiHaler 18 MCG inhalation capsule Generic drug: tiotropium     TAKE these medications   amLODipine 5 MG tablet Commonly known as: NORVASC Take 5 mg by mouth daily.   arformoterol 15 MCG/2ML Nebu Commonly known as: BROVANA Take 2 mLs (15 mcg total) by nebulization 2 (two) times daily.   aspirin EC 81 MG tablet Take 81  mg by mouth daily.   atorvastatin 80 MG tablet Commonly known as: LIPITOR Take 80 mg by mouth daily.   budesonide 0.25 MG/2ML nebulizer solution Commonly known as: PULMICORT Take 2 mLs (0.25 mg total) by nebulization 2 (two) times daily.   clopidogrel 75 MG tablet Commonly known as: PLAVIX Take 75 mg by mouth daily.   EPINEPHrine 0.3 mg/0.3 mL Soaj injection Commonly  known as: EPI-PEN Inject 0.3 mg into the muscle as needed for anaphylaxis.   FLUoxetine 10 MG capsule Commonly known as: PROZAC Take 1 capsule (10 mg total) by mouth daily.   isosorbide mononitrate 30 MG 24 hr tablet Commonly known as: IMDUR Take 30 mg by mouth daily.   levocetirizine 5 MG tablet Commonly known as: XYZAL Take 5 mg by mouth at bedtime.   linaclotide 290 MCG Caps capsule Commonly known as: LINZESS Take 290 mcg by mouth daily before breakfast.   lisinopril-hydrochlorothiazide 20-12.5 MG tablet Commonly known as: ZESTORETIC TAKE 1 TABLET BY MOUTH DAILY.   metFORMIN 500 MG tablet Commonly known as: GLUCOPHAGE Take 500 mg by mouth daily.   montelukast 10 MG tablet Commonly known as: SINGULAIR Take 10 mg by mouth daily.   Multi-Vitamins Tabs Take 1 tablet by mouth daily.   naloxone 4 MG/0.1ML Liqd nasal spray kit Commonly known as: NARCAN Place 0.4 mg into the nose once.   nitroGLYCERIN 0.4 MG SL tablet Commonly known as: NITROSTAT Place 0.4 mg under the tongue every 5 (five) minutes as needed for chest pain.   oxyCODONE 15 MG immediate release tablet Commonly known as: ROXICODONE Take 15 mg by mouth See admin instructions. Six time a day   pantoprazole 40 MG tablet Commonly known as: PROTONIX TAKE 1 TABLET (40 MG TOTAL) BY MOUTH DAILY.   polyethylene glycol 17 g packet Commonly known as: MIRALAX / GLYCOLAX Take 17 g by mouth daily as needed (for constipation).   predniSONE 20 MG tablet Commonly known as: DELTASONE Take 1 tablet (20 mg total) by mouth daily for 8 doses.   pregabalin 150 MG capsule Commonly known as: LYRICA Take 150 mg by mouth daily.   ProAir HFA 108 (90 Base) MCG/ACT inhaler Generic drug: albuterol INHALE 2 PUFFS INTO THE LUNGS EVERY 6 (SIX) HOURS AS NEEDED FOR WHEEZING. What changed:   See the new instructions.  Another medication with the same name was removed. Continue taking this medication, and follow the directions  you see here.   revefenacin 175 MCG/3ML nebulizer solution Commonly known as: YUPELRI Take 3 mLs (175 mcg total) by nebulization daily. Start taking on: March 04, 2021   rOPINIRole 1 MG tablet Commonly known as: REQUIP Take 1 mg by mouth at bedtime.   tamsulosin 0.4 MG Caps capsule Commonly known as: FLOMAX TAKE (1) CAPSULE BY MOUTH ONCE DAILY. What changed: See the new instructions.            Durable Medical Equipment  (From admission, onward)         Start     Ordered   03/03/21 1341  For home use only DME oxygen  Once       Question Answer Comment  Length of Need Lifetime   Mode or (Route) Nasal cannula   Liters per Minute 3   Oxygen conserving device Yes   Oxygen delivery system Gas      03/03/21 1340   02/28/21 1235  For home use only DME oxygen  Once       Question Answer Comment  Length of Need Lifetime   Mode or (Route) Nasal cannula   Liters per Minute 4   Frequency Continuous (stationary and portable oxygen unit needed)   Oxygen conserving device Yes   Oxygen delivery system Gas      02/28/21 1234          Follow-up Information    Corrington, Kip A, MD On 03/16/2021.   Specialty: Family Medicine Why: @ 1:30pm Contact information: [place] [address] [place] [address] [phone]        Tyler Pita, MD Today.   Specialty: Pulmonary Disease Why: follow up 30 Mar 2021 11 AM. Contact information: [place] [address] [phone]        Dionisio David, MD On 03/10/2021.   Specialty: Cardiology Why: @ 1:45pm Contact information: [address] [place] [address] [phone]              Allergies  Allergen Reactions  . Bee Venom Swelling    Consultations:  pccm   Procedures/Studies: DG Chest 2 View  Result Date: 02/26/2021 CLINICAL DATA:  Chest pain with difficulty breathing for 3 days. EXAM: CHEST - 2 VIEW COMPARISON:  07/23/2019 FINDINGS: Heart size and pulmonary  vascularity are normal. Mild linear changes in the lung bases likely represent vascular crowding with slightly shallow inspiration. No airspace disease or consolidation. No pleural effusions. No pneumothorax. Calcification of the aorta. Degenerative changes in the spine. Postoperative changes in the sternum and lumbar spine. IMPRESSION: No active cardiopulmonary disease. Electronically Signed   By: Lucienne Capers M.D.   On: 02/26/2021 22:51   CT ANGIO CHEST PE W OR WO CONTRAST  Result Date: 03/01/2021 CLINICAL DATA:  Shortness of breath EXAM: CT ANGIOGRAPHY CHEST WITH CONTRAST TECHNIQUE: Multidetector CT imaging of the chest was performed using the standard protocol during bolus administration of intravenous contrast. Multiplanar CT image reconstructions and MIPs were obtained to evaluate the vascular anatomy. CONTRAST:  51m OMNIPAQUE IOHEXOL 350 MG/ML SOLN COMPARISON:  Chest radiograph and chest CT February 26, 2021 FINDINGS: Cardiovascular: There is no demonstrable pulmonary embolus. There is no thoracic aortic aneurysm. No dissection evident. Note that the contrast bolus in the aorta is somewhat less than optimal for dissection assessment. There are scattered foci of calcification in visualized great vessels. There is aortic atherosclerosis as well as foci of coronary artery calcification. There is no pericardial effusion or pericardial thickening. Mediastinum/Nodes: Visualized thyroid appears unremarkable. No appreciable thoracic adenopathy. No esophageal lesions are appreciable. Lungs/Pleura: There is again noted centrilobular and paraseptal emphysematous change. There is atelectatic change in each lung base. There is no appreciable edema or consolidation. Stable scattered 1-2 mm nodular opacities. No evident pleural effusion. Trachea and major bronchial structures appear normal. No evident pneumothorax. Upper Abdomen: There is upper abdominal aortic atherosclerosis. Gallbladder absent. Visualized upper  abdominal structures otherwise appear normal. Musculoskeletal: Status post median sternotomy degenerative change in thoracic spine. Incomplete visualization of postoperative change in lower thoracic/upper lumbar region. No blastic or lytic bone lesions. No evident chest wall lesions. Review of the MIP images confirms the above findings. IMPRESSION: 1. No demonstrable pulmonary embolus. No thoracic aortic aneurysm or dissection. There is aortic atherosclerosis as well as foci of great vessel and coronary artery calcification. 2. Bibasilar atelectasis. No edema or airspace opacity. Underlying emphysematous changes noted. 3.  No evident adenopathy. 4.  Gallbladder absent. Aortic Atherosclerosis (ICD10-I70.0) and Emphysema (ICD10-J43.9). Electronically Signed   By: WLowella GripIII M.D.  On: 03/01/2021 11:42   CT CHEST LCS NODULE F/U W/O CONTRAST  Result Date: 02/27/2021 CLINICAL DATA:  65 year old male former smoker (quit November 2021) with 96 pack-year history of smoking. Follow-up for prior abnormal low-dose lung cancer screening examination. EXAM: CT CHEST WITHOUT CONTRAST FOR LUNG CANCER SCREENING NODULE FOLLOW-UP TECHNIQUE: Multidetector CT imaging of the chest was performed following the standard protocol without IV contrast. COMPARISON:  Low-dose lung cancer screening chest CT 11/06/2020. FINDINGS: Cardiovascular: Heart size is normal. There is no significant pericardial fluid, thickening or pericardial calcification. There is aortic atherosclerosis, as well as atherosclerosis of the great vessels of the mediastinum and the coronary arteries, including calcified atherosclerotic plaque in the left main, left anterior descending, left circumflex and right coronary arteries. Mediastinum/Nodes: No pathologically enlarged mediastinal or hilar lymph nodes. Please note that accurate exclusion of hilar adenopathy is limited on noncontrast CT scans. Esophagus is unremarkable in appearance. No axillary  lymphadenopathy. Lungs/Pleura: Multiple small pulmonary nodules are again noted throughout the lungs bilaterally, generally stable compared to prior examinations. Several lobe the larger nodules seen on the prior study that were of concern have either completely resolved or have decreased in size compared to prior examinations. No larger more suspicious appearing pulmonary nodules or masses are noted. No acute consolidative airspace disease. No pleural effusions. Mild diffuse bronchial wall thickening with mild centrilobular and paraseptal emphysema. Upper Abdomen: Aortic atherosclerosis. Diffuse low attenuation throughout the visualized hepatic parenchyma, indicative of hepatic steatosis. Musculoskeletal: There are no aggressive appearing lytic or blastic lesions noted in the visualized portions of the skeleton. IMPRESSION: 1. Lung-RADS 2S, benign appearance or behavior. Continue annual screening with low-dose chest CT without contrast in 12 months. 2. The "S" modifier above refers to potentially clinically significant non lung cancer related findings. Specifically, there is aortic atherosclerosis, in addition to left main and 3 vessel coronary artery disease. Please note that although the presence of coronary artery calcium documents the presence of coronary artery disease, the severity of this disease and any potential stenosis cannot be assessed on this non-gated CT examination. Assessment for potential risk factor modification, dietary therapy or pharmacologic therapy may be warranted, if clinically indicated. 3. Mild diffuse bronchial wall thickening with mild centrilobular and paraseptal emphysema; imaging findings suggestive of underlying COPD. 4. Hepatic steatosis. Aortic Atherosclerosis (ICD10-I70.0) and Emphysema (ICD10-J43.9). Electronically Signed   By: Vinnie Langton M.D.   On: 02/27/2021 14:44   ECHOCARDIOGRAM COMPLETE  Result Date: 03/02/2021    ECHOCARDIOGRAM REPORT   Patient Name:   Bradley Sawyer Date of Exam: 03/02/2021 Medical Rec #:  [id number]        Height:       71.0 in Accession #:    [id number]       Weight:       223.5 lb Date of Birth:  [birth year]        BSA:          2.211 m Patient Age:    78 years         BP:           136/71 mmHg Patient Gender: M                HR:           53 bpm. Exam Location:  [facility] Procedure: 2D Echo, Cardiac Doppler, Color Doppler and Intracardiac            Opacification Agent Indications:     Dyspnea 06.00  History:  Patient has no prior history of Echocardiogram examinations.                  COPD; Risk Factors:Hypertension.  Sonographer:     Sherrie Sport RDCS (AE) Referring Phys:  [id number] Barb Merino Diagnosing Phys: Nelva Bush MD  Sonographer Comments: Suboptimal parasternal window and suboptimal apical window. Image acquisition challenging due to COPD. IMPRESSIONS  1. Left ventricular ejection fraction, by estimation, is 50 to 55%. The left ventricle has low normal function. Left ventricular endocardial border not optimally defined to evaluate regional wall motion. The left ventricular internal cavity size was mildly dilated. Left ventricular diastolic parameters are consistent with Grade II diastolic dysfunction (pseudonormalization). Elevated left atrial pressure.  2. Right ventricular systolic function is normal. The right ventricular size is normal. Tricuspid regurgitation signal is inadequate for assessing PA pressure.  3. Left atrial size was mildly dilated.  4. Right atrial size was mildly dilated.  5. The mitral valve was not well visualized. Trivial mitral valve regurgitation. No evidence of mitral stenosis.  6. The aortic valve was not well visualized. Aortic valve regurgitation is not visualized. No aortic stenosis is present. FINDINGS  Left Ventricle: Left ventricular ejection fraction, by estimation, is 50 to 55%. The left ventricle has low normal function. Left ventricular endocardial border not optimally defined to evaluate regional  wall motion. Definity contrast agent was given IV  to delineate the left ventricular endocardial borders. The left ventricular internal cavity size was mildly dilated. There is borderline left ventricular hypertrophy. Left ventricular diastolic parameters are consistent with Grade II diastolic dysfunction (pseudonormalization). Elevated left atrial pressure. Right Ventricle: The right ventricular size is normal. No increase in right ventricular wall thickness. Right ventricular systolic function is normal. Tricuspid regurgitation signal is inadequate for assessing PA pressure. Left Atrium: Left atrial size was mildly dilated. Right Atrium: Right atrial size was mildly dilated. Pericardium: There is no evidence of pericardial effusion. Mitral Valve: The mitral valve was not well visualized. Trivial mitral valve regurgitation. No evidence of mitral valve stenosis. Tricuspid Valve: The tricuspid valve is not well visualized. Tricuspid valve regurgitation is trivial. Aortic Valve: The aortic valve was not well visualized. Aortic valve regurgitation is not visualized. No aortic stenosis is present. Aortic valve mean gradient measures 2.0 mmHg. Aortic valve peak gradient measures 4.4 mmHg. Aortic valve area, by VTI measures 2.96 cm. Pulmonic Valve: The pulmonic valve was not well visualized. Pulmonic valve regurgitation is not visualized. No evidence of pulmonic stenosis. Aorta: The aortic root is normal in size and structure. Pulmonary Artery: The pulmonary artery is not well seen. Venous: The inferior vena cava was not well visualized. IAS/Shunts: The interatrial septum appears to be lipomatous. No atrial level shunt detected by color flow Doppler.  LEFT VENTRICLE PLAX 2D LVIDd:         5.77 cm  Diastology LVIDs:         3.96 cm  LV e' medial:    3.92 cm/s LV PW:         1.05 cm  LV E/e' medial:  19.6 LV IVS:        0.89 cm  LV e' lateral:   6.96 cm/s LVOT diam:     2.20 cm  LV E/e' lateral: 11.0 LV SV:         68 LV  SV Index:   31 LVOT Area:     3.80 cm  RIGHT VENTRICLE RV Basal diam:  3.98 cm RV S prime:  11.40 cm/s TAPSE (M-mode): 3.7 cm LEFT ATRIUM           Index       RIGHT ATRIUM           Index LA diam:      3.50 cm 1.58 cm/m  RA Area:     18.10 cm LA Vol (A2C): 87.9 ml 39.76 ml/m RA Volume:   51.70 ml  23.39 ml/m LA Vol (A4C): 35.6 ml 16.10 ml/m  AORTIC VALVE                   PULMONIC VALVE AV Area (Vmax):    3.31 cm    PV Vmax:        0.57 m/s AV Area (Vmean):   3.10 cm    PV Peak grad:   1.3 mmHg AV Area (VTI):     2.96 cm    RVOT Peak grad: 3 mmHg AV Vmax:           105.00 cm/s AV Vmean:          69.200 cm/s AV VTI:            0.230 m AV Peak Grad:      4.4 mmHg AV Mean Grad:      2.0 mmHg LVOT Vmax:         91.40 cm/s LVOT Vmean:        56.500 cm/s LVOT VTI:          0.179 m LVOT/AV VTI ratio: 0.78  AORTA Ao Root diam: 2.90 cm MITRAL VALVE MV Area (PHT): 3.36 cm    SHUNTS MV Decel Time: 226 msec    Systemic VTI:  0.18 m MV E velocity: 76.70 cm/s  Systemic Diam: 2.20 cm MV A velocity: 60.40 cm/s MV E/A ratio:  1.27 Harrell Gave End MD Electronically signed by Nelva Bush MD Signature Date/Time: 03/02/2021/9:47:44 AM    Final       Subjective: Feels better more at baseline  Discharge Exam: Vitals:   03/03/21 0800 03/03/21 1321  BP:    Pulse:    Resp:    Temp:    SpO2: 93% 93%   Vitals:   03/03/21 0404 03/03/21 0750 03/03/21 0800 03/03/21 1321  BP: 104/67     Pulse: 60     Resp: 17     Temp: 97.9 F (36.6 C)     TempSrc: Oral     SpO2: 95% 93% 93% 93%  Weight:      Height:        General: Pt is alert, awake, not in acute distress Cardiovascular: RRR, S1/S2 +, no rubs, no gallops Respiratory: CTA bilaterally, no wheezing, no rhonchi Abdominal: Soft, NT, ND, bowel sounds + Extremities: no edema    The results of significant diagnostics from this hospitalization (including imaging, microbiology, ancillary and laboratory) are listed below for reference.      Microbiology: Recent Results (from the past 240 hour(s))  SARS CORONAVIRUS 2 (TAT 6-24 HRS) Nasopharyngeal Nasopharyngeal Swab     Status: None   Collection Time: 02/27/21  3:32 AM   Specimen: Nasopharyngeal Swab  Result Value Ref Range Status   SARS Coronavirus 2 NEGATIVE NEGATIVE Final    Comment: (NOTE) SARS-CoV-2 target nucleic acids are NOT DETECTED.  The SARS-CoV-2 RNA is generally detectable in upper and lower respiratory specimens during the acute phase of infection. Negative results do not preclude SARS-CoV-2 infection, do not rule out co-infections with other pathogens, and should not  be used as the sole basis for treatment or other patient management decisions. Negative results must be combined with clinical observations, patient history, and epidemiological information. The expected result is Negative.  Fact Sheet for Patients: SugarRoll.be  Fact Sheet for Healthcare Providers: https://www.woods-mathews.com/  This test is not yet approved or cleared by the [place] FDA and  has been authorized for detection and/or diagnosis of SARS-CoV-2 by FDA under an Emergency Use Authorization (EUA). This EUA will remain  in effect (meaning this test can be used) for the duration of the COVID-19 declaration under Se ction 564(b)(1) of the Act, 21 U.S.C. section 360bbb-3(b)(1), unless the authorization is terminated or revoked sooner.  Performed at [facility], [place] [address], [place], [place] [address]      Labs: BNP (last 3 results) Recent Labs    02/26/21 2225  BNP 97.6   Basic Metabolic Panel: Recent Labs  Lab 02/26/21 2225 02/27/21 1342 02/28/21 0437  NA 139  --  136  K 3.4*  --  3.7  CL 101  --  100  CO2 28  --  26  GLUCOSE 120*  --  148*  BUN 10  --  28*  CREATININE 0.96 0.89 1.04  CALCIUM 9.4  --  9.5  MG  --   --  2.0   Liver Function Tests: No results for input(s): AST, ALT, ALKPHOS,  BILITOT, PROT, ALBUMIN in the last 168 hours. No results for input(s): LIPASE, AMYLASE in the last 168 hours. No results for input(s): AMMONIA in the last 168 hours. CBC: Recent Labs  Lab 02/26/21 2225 02/27/21 1342 02/28/21 0437 03/01/21 0431  WBC 9.8 11.8* 21.8* 19.1*  NEUTROABS 6.5  --   --  14.7*  HGB 12.5* 12.7* 12.6* 12.6*  HCT 38.2* 37.5* 37.0* 37.0*  MCV 93.6 91.5 91.8 92.0  PLT 194 194 195 177   Cardiac Enzymes: No results for input(s): CKTOTAL, CKMB, CKMBINDEX, TROPONINI in the last 168 hours. BNP: Invalid input(s): POCBNP CBG: No results for input(s): GLUCAP in the last 168 hours. D-Dimer No results for input(s): DDIMER in the last 72 hours. Hgb A1c No results for input(s): HGBA1C in the last 72 hours. Lipid Profile No results for input(s): CHOL, HDL, LDLCALC, TRIG, CHOLHDL, LDLDIRECT in the last 72 hours. Thyroid function studies No results for input(s): TSH, T4TOTAL, T3FREE, THYROIDAB in the last 72 hours.  Invalid input(s): FREET3 Anemia work up No results for input(s): VITAMINB12, FOLATE, FERRITIN, TIBC, IRON, RETICCTPCT in the last 72 hours. Urinalysis    Component Value Date/Time   COLORURINE YELLOW (A) 02/19/2017 1459   APPEARANCEUR Cloudy (A) 01/19/2021 1120   LABSPEC 1.016 02/19/2017 1459   LABSPEC 1.014 06/27/2013 2105   PHURINE 5.0 02/19/2017 1459   GLUCOSEU Negative 01/19/2021 1120   GLUCOSEU Negative 06/27/2013 2105   GLUCOSEU NEG mg/dL 01/31/2009 2108   HGBUR MODERATE (A) 02/19/2017 1459   BILIRUBINUR Negative 01/19/2021 1120   BILIRUBINUR Negative 06/27/2013 2105   KETONESUR NEGATIVE 02/19/2017 1459   PROTEINUR Negative 01/19/2021 1120   PROTEINUR NEGATIVE 02/19/2017 1459   UROBILINOGEN 1.0 10/19/2014 1504   NITRITE Negative 01/19/2021 1120   NITRITE NEGATIVE 02/19/2017 1459   LEUKOCYTESUR Trace (A) 01/19/2021 1120   LEUKOCYTESUR Trace 06/27/2013 2105   Sepsis Labs Invalid input(s): PROCALCITONIN,  WBC,   LACTICIDVEN Microbiology Recent Results (from the past 240 hour(s))  SARS CORONAVIRUS 2 (TAT 6-24 HRS) Nasopharyngeal Nasopharyngeal Swab     Status: None   Collection Time: 02/27/21  3:32 AM   Specimen: Nasopharyngeal Swab  Result Value Ref Range Status   SARS Coronavirus 2 NEGATIVE NEGATIVE Final    Comment: (NOTE) SARS-CoV-2 target nucleic acids are NOT DETECTED.  The SARS-CoV-2 RNA is generally detectable in upper and lower respiratory specimens during the acute phase of infection. Negative results do not preclude SARS-CoV-2 infection, do not rule out co-infections with other pathogens, and should not be used as the sole basis for treatment or other patient management decisions. Negative results must be combined with clinical observations, patient history, and epidemiological information. The expected result is Negative.  Fact Sheet for Patients: SugarRoll.be  Fact Sheet for Healthcare Providers: https://www.woods-mathews.com/  This test is not yet approved or cleared by the [place] FDA and  has been authorized for detection and/or diagnosis of SARS-CoV-2 by FDA under an Emergency Use Authorization (EUA). This EUA will remain  in effect (meaning this test can be used) for the duration of the COVID-19 declaration under Se ction 564(b)(1) of the Act, 21 U.S.C. section 360bbb-3(b)(1), unless the authorization is terminated or revoked sooner.  Performed at [facility], [place] [address] [place], [place] [address]      Time coordinating discharge: Over 30 minutes  SIGNED:   Nolberto Hanlon, MD  [facility] 03/03/2021, 2:40 PM Pager   If 7PM-7AM, please contact night-coverage [email or web address] Password [email or web address]

## 2021-03-03 NOTE — TOC Initial Note (Signed)
Transition of Care [facility]) - Initial/Assessment Note    Patient Details  Name: Bradley Sawyer MRN: [id number] Date of Birth: 25-Nov-1955  Transition of [facility]) CM/SW Contact:    Kerin Salen, RN Phone Number: 03/03/2021, 1:24 PM  Clinical Narrative:  TOC spoke with patient  Who says [place] with his son and daughter-in-law. Patient states he drives and able to attend medical appointments and pick up medications at the  [facility]. Patient states he uses [employer] 2L [place] only at night, could not remember the name of the supplier however never had problems with refilling tanks. Use cane at home sometimes while ambulating. Do have nebulizer machine at home if needed. No TOC needs assessed will continue to track for discharge needs.                   Patient Goals and CMS Choice        Expected Discharge Plan and Services                                                Prior Living Arrangements/Services                       Activities of Daily Living Home Assistive Devices/Equipment: Cane (specify quad or straight),Oxygen ADL Screening (condition at time of admission) Patient's cognitive ability adequate to safely complete daily activities?: Yes Is the patient deaf or have difficulty hearing?: No Does the patient have difficulty seeing, even when wearing glasses/contacts?: No Does the patient have difficulty concentrating, remembering, or making decisions?: No Patient able to express need for assistance with ADLs?: Yes Does the patient have difficulty dressing or bathing?: No Independently performs ADLs?: Yes (appropriate for developmental age) Does the patient have difficulty walking or climbing stairs?: No Weakness of Legs: None Weakness of Arms/Hands: None  Permission Sought/Granted                  Emotional Assessment              Admission diagnosis:  CHF (congestive heart failure) ([place]) [I50.9] COPD  exacerbation ([place]) [J44.1] Patient Active Problem List   Diagnosis Date Noted  . COPD exacerbation ([place]) 02/27/2021  . History of CVA (cerebrovascular accident) 02/27/2021  . Chronic pain, radicular 02/27/2021  . Chronic, continuous use of opioids 02/27/2021  . Unstable angina ([place]) 02/19/2021  . Cigarette nicotine dependence without complication 46/65/9935  . Acute lower UTI 02/19/2017  . UTI (urinary tract infection) 02/19/2017  . Left inguinal hernia 08/12/2016  . Elevated troponin 05/08/2016  . Anal fissure 03/15/2016  . External hemorrhoid 03/15/2016  . Acute viral pharyngitis 03/20/2015  . Olecranon bursitis of left elbow 12/23/2014  . Embolic stroke ([place]) 70/17/7939  . Carotid stenosis 10/15/2014  . 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[place]) 09/19/2014  . 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 PAD (peripheral artery disease) ([place]) 08/23/2014  . Preventative health care 08/12/2014  . Occlusion and stenosis of carotid artery without mention of cerebral infarction 07/25/2014  . Subclavian steal syndrome 07/25/2014  . CAD (coronary artery disease) 07/10/2014  . Nocturia 04/17/2014  . Testicular pain, right 04/17/2014  . Urethral stricture 04/17/2014  . CVA (cerebral infarction) 04/10/2014  . 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 Hematuria 08/06/2013  . Compression fracture of  L2, with back and right groin pain 07/18/2013  . History of colon polyps 05/18/2012  . COPD (chronic obstructive pulmonary disease) ([place]) 12/24/2009  . Hyperlipemia 09/20/2006  . TOBACCO ABUSE 09/20/2006  . Essential hypertension 09/20/2006  . GERD 09/20/2006   PCP:  Curly Rim, MD Pharmacy:   [place], [place] - [address] [place] [address] Phone: [phone] Fax: [phone]     Social Determinants of Health (SDOH) Interventions    Readmission Risk Interventions No flowsheet data  found.

## 2021-03-03 NOTE — Progress Notes (Signed)
Nutrition Follow Up Note   DOCUMENTATION CODES:   Obesity unspecified  INTERVENTION:   Ensure Enlive po TID, each supplement provides 350 kcal and 20 grams of protein  MVI po daily   NUTRITION DIAGNOSIS:   Increased nutrient needs related to catabolic illness (COPD) as evidenced by estimated needs.  GOAL:   Patient will meet greater than or equal to 90% of their needs  -met   MONITOR:   PO intake,Supplement acceptance,Labs,Weight trends,Skin,I & O's  ASSESSMENT:   65 y.o. male with medical history significant for COPD on nighttime O2 at 2-3 L, former heavy smoker, former cocaine abuse, chronic back pain on chronic high dose opioids, CAD, GERD and CVA who is admitted with shortness of breath   Pt continues to have good appetite and oral intake in hospital; pt eating 100% of meals and drinking Ensure supplements. No BM documented since admit. Per chart, pt is weight stable since admit.   Medications reviewed and include: aspirin, plavix, lovenox, meloxicam, MVI, protonix  Labs reviewed: none recent   Diet Order:   Diet Order            Diet - low sodium heart healthy           Diet Heart Room service appropriate? Yes; Fluid consistency: Thin  Diet effective now                EDUCATION NEEDS:   No education needs have been identified at this time  Skin:  Skin Assessment: Reviewed RN Assessment  Last BM:  pta  Height:   Ht Readings from Last 1 Encounters:  02/27/21 '5\' 11"'  (1.803 m)    Weight:   Wt Readings from Last 1 Encounters:  03/02/21 101.4 kg    Ideal Body Weight:  78.18 kg  BMI:  Body mass index is 31.17 kg/m.  Estimated Nutritional Needs:   Kcal:  2200-2500kcal/day  Protein:  110-125g/day  Fluid:  2.3-2.6L/day  Koleen Distance MS, RD, LDN Please refer to [facility] for RD and/or RD on-call/weekend/after hours pager

## 2021-03-04 ENCOUNTER — Encounter: Payer: Self-pay | Admitting: *Deleted

## 2021-03-04 MED ORDER — REVEFENACIN 175 MCG/3ML IN SOLN: 175.0000 ug | Freq: Every day | RESPIRATORY_TRACT | 11 refills | Status: DC

## 2021-03-04 MED ORDER — ARFORMOTEROL TARTRATE 15 MCG/2ML IN NEBU: 15.0000 ug | INHALATION_SOLUTION | Freq: Two times a day (BID) | RESPIRATORY_TRACT | 11 refills | Status: DC

## 2021-03-04 NOTE — Addendum Note (Signed)
Addended by: Claudette Head A on: 03/04/2021 02:57 PM   Modules accepted: Orders

## 2021-03-04 NOTE — Telephone Encounter (Signed)
Patient's medications were switched to [place], pulmicort and Yupelri during recent admission.  Will close encounter.

## 2021-03-04 NOTE — Telephone Encounter (Signed)
Received message via secure epic message from Dr. Kurtis Bushman, who stated that insurance denied brovana and yupelri. These medications have been sent to [facility].  Patient is aware and voiced his understanding.  Nothing further needed.

## 2021-03-05 NOTE — Telephone Encounter (Signed)
[facility] is requesting last OV note to be faxed to [phone].  Dr. Patsey Berthold, please advise.

## 2021-03-05 NOTE — Telephone Encounter (Signed)
What you need to send him is the copy of my consultation note and follow-up note from the hospital.  Since that is when the medications are noted.  The OV note will be useless.

## 2021-03-05 NOTE — Telephone Encounter (Signed)
Consult note has been faxed to [facility].  Received fax confirmation.  Nothing further needed at this time.

## 2021-03-11 MED ORDER — REVEFENACIN 175 MCG/3ML IN SOLN: 175.0000 ug | Freq: Every day | RESPIRATORY_TRACT | 11 refills | Status: AC

## 2021-03-11 MED ORDER — ARFORMOTEROL TARTRATE 15 MCG/2ML IN NEBU: 15.0000 ug | INHALATION_SOLUTION | Freq: Two times a day (BID) | RESPIRATORY_TRACT | 11 refills | Status: DC

## 2021-03-11 NOTE — Telephone Encounter (Addendum)
Received call from [facility] with [facility], who stated that patient's insurance is not in network with their pharmacy. She recommended that she send Rx to [facility].  Spoke to patient, who prefers to get Rx from [facility]. Rx for Garlon Hatchet and Maretta Bees has been sent to preferred pharmacy.  Nothing further nothing further

## 2021-03-11 NOTE — Addendum Note (Signed)
Addended by: Claudette Head A on: 03/11/2021 02:33 PM   Modules accepted: Orders

## 2021-03-11 NOTE — Addendum Note (Signed)
Addended by: Claudette Head A on: 03/11/2021 02:36 PM   Modules accepted: Orders

## 2021-03-11 NOTE — Telephone Encounter (Signed)
Order for nebulizer machine has been sent to [facility] per patient request, as his current machine is 65 years old.

## 2021-03-12 ENCOUNTER — Telehealth: Payer: Self-pay | Admitting: Pulmonary Disease

## 2021-03-12 NOTE — Telephone Encounter (Signed)
PA request was received from (pharmacy): [facility] IYMEB:[phone] Fax: [phone] Medication name and strength:Arformoterol tartrate 15cmg and Yupelri 189mcg Ordering Provider: Patsey Berthold  Was PA started with [facility]?: yes If yes, please enter KEY: [id number] and [id number] Medication tried and failed: spiriva, dulera, advair and Breztri Covered Alternatives: n/a  PA sent to plan, time frame for approval / denial: 72hr Routing to triage for follow-up

## 2021-03-12 NOTE — Telephone Encounter (Signed)
PA for yupelri and arformoterol have both been approved per CMM. Melissa with [employer] is aware of approval. Patient has already picked up Rx.  Nothing further needed at this time.

## 2021-03-16 ENCOUNTER — Other Ambulatory Visit: Payer: Self-pay

## 2021-03-16 ENCOUNTER — Other Ambulatory Visit
Admission: RE | Admit: 2021-03-16 | Discharge: 2021-03-16 | Disposition: A | Discharge status: Home / Self Care | Payer: Medicaid Other | Source: Ambulatory Visit | Attending: Cardiology | Admitting: Cardiology

## 2021-03-16 DIAGNOSIS — Z01812 Encounter for preprocedural laboratory examination: Secondary | ICD-10-CM | POA: Diagnosis not present

## 2021-03-16 DIAGNOSIS — Z20822 Contact with and (suspected) exposure to covid-19: Secondary | ICD-10-CM | POA: Diagnosis not present

## 2021-03-16 LAB — SARS CORONAVIRUS 2 (TAT 6-24 HRS): SARS Coronavirus 2: NEGATIVE

## 2021-03-17 ENCOUNTER — Encounter
Admission: RE | Disposition: A | Discharge status: Home / Self Care | Payer: Self-pay | Source: Home / Self Care | Attending: Cardiovascular Disease

## 2021-03-17 ENCOUNTER — Encounter: Payer: Self-pay | Admitting: Cardiovascular Disease

## 2021-03-17 ENCOUNTER — Ambulatory Visit
Admission: RE | Admit: 2021-03-17 | Discharge: 2021-03-17 | Disposition: A | Discharge status: Home / Self Care | Payer: Medicaid Other | Attending: Cardiovascular Disease | Admitting: Cardiovascular Disease

## 2021-03-17 ENCOUNTER — Other Ambulatory Visit: Payer: Self-pay

## 2021-03-17 DIAGNOSIS — I200 Unstable angina: Secondary | ICD-10-CM | POA: Diagnosis not present

## 2021-03-17 HISTORY — PX: LEFT HEART CATH AND CORONARY ANGIOGRAPHY: CATH118249

## 2021-03-17 SURGERY — LEFT HEART CATH AND CORONARY ANGIOGRAPHY
Anesthesia: Moderate Sedation

## 2021-03-17 MED ORDER — SODIUM CHLORIDE 0.9 % IV SOLN: 250.0000 mL | INTRAVENOUS | Status: DC | PRN

## 2021-03-17 MED ORDER — HEPARIN (PORCINE) IN NACL 1000-0.9 UT/500ML-% IV SOLN
INTRAVENOUS | Status: AC
  Filled 2021-03-17: qty 1000

## 2021-03-17 MED ORDER — SODIUM CHLORIDE 0.9% FLUSH: 3.0000 mL | Freq: Two times a day (BID) | INTRAVENOUS | Status: DC

## 2021-03-17 MED ORDER — ONDANSETRON HCL 4 MG/2ML IJ SOLN: 4.0000 mg | Freq: Four times a day (QID) | INTRAMUSCULAR | Status: DC | PRN

## 2021-03-17 MED ORDER — FENTANYL CITRATE (PF) 100 MCG/2ML IJ SOLN
INTRAMUSCULAR | Status: AC
  Filled 2021-03-17: qty 2

## 2021-03-17 MED ORDER — MIDAZOLAM HCL 2 MG/2ML IJ SOLN
INTRAMUSCULAR | Status: AC
  Filled 2021-03-17: qty 2

## 2021-03-17 MED ORDER — ACETAMINOPHEN 325 MG PO TABS: 650.0000 mg | ORAL_TABLET | ORAL | Status: DC | PRN

## 2021-03-17 MED ORDER — HYDRALAZINE HCL 20 MG/ML IJ SOLN: 10.0000 mg | INTRAMUSCULAR | Status: DC | PRN

## 2021-03-17 MED ORDER — SODIUM CHLORIDE 0.9% FLUSH: 3.0000 mL | INTRAVENOUS | Status: DC | PRN

## 2021-03-17 MED ORDER — SODIUM CHLORIDE 0.9 % WEIGHT BASED INFUSION: 1.0000 mL/kg/h | INTRAVENOUS | Status: DC

## 2021-03-17 MED ORDER — LABETALOL HCL 5 MG/ML IV SOLN: 10.0000 mg | INTRAVENOUS | Status: DC | PRN

## 2021-03-17 MED ORDER — ASPIRIN 81 MG PO CHEW: 81.0000 mg | CHEWABLE_TABLET | ORAL | Status: DC

## 2021-03-17 MED ORDER — HEPARIN (PORCINE) IN NACL 1000-0.9 UT/500ML-% IV SOLN
INTRAVENOUS | Status: DC | PRN
  Administered 2021-03-17: 1000 mL

## 2021-03-17 MED ORDER — SODIUM CHLORIDE 0.9 % WEIGHT BASED INFUSION: 3.0000 mL/kg/h | INTRAVENOUS | Status: DC

## 2021-03-17 MED ORDER — FENTANYL CITRATE (PF) 100 MCG/2ML IJ SOLN
INTRAMUSCULAR | Status: DC | PRN
  Administered 2021-03-17 (×2): 25 ug via INTRAVENOUS

## 2021-03-17 MED ORDER — MIDAZOLAM HCL 2 MG/2ML IJ SOLN
INTRAMUSCULAR | Status: DC | PRN
  Administered 2021-03-17: 1 mg via INTRAVENOUS

## 2021-03-17 MED ORDER — IOHEXOL 300 MG/ML  SOLN
INTRAMUSCULAR | Status: DC | PRN
  Administered 2021-03-17: 63 mL

## 2021-03-17 SURGICAL SUPPLY — 12 items
CATH INFINITI 5FR JL4 (CATHETERS) ×1 IMPLANT
CATH INFINITI JR4 5F (CATHETERS) ×1 IMPLANT
DEVICE CLOSURE MYNXGRIP 5F (Vascular Products) ×1 IMPLANT
DRAPE BRACHIAL (DRAPES) IMPLANT
KIT SYRINGE INJ CVI SPIKEX1 (MISCELLANEOUS) ×1 IMPLANT
NEEDLE PERC 18GX7CM (NEEDLE) ×2 IMPLANT
PACK CARDIAC CATH (CUSTOM PROCEDURE TRAY) ×2 IMPLANT
PROTECTION STATION PRESSURIZED (MISCELLANEOUS) ×2
SET ATX SIMPLICITY (MISCELLANEOUS) ×1 IMPLANT
SHEATH AVANTI 5FR X 11CM (SHEATH) ×2 IMPLANT
STATION PROTECTION PRESSURIZED (MISCELLANEOUS) ×1 IMPLANT
WIRE GUIDERIGHT .035X150 (WIRE) ×1 IMPLANT

## 2021-03-17 NOTE — Discharge Instructions (Signed)
Femoral Site Care  This sheet gives you information about how to care for yourself after your procedure. Your health care provider may also give you more specific instructions. If you have problems or questions, contact your health care provider. What can I expect after the procedure? After the procedure, it is common to have:  Bruising that usually fades within 1-2 weeks.  Tenderness at the site. Follow these instructions at home: Wound care  Follow instructions from your health care provider about how to take care of your insertion site. Make sure you: ? Wash your hands with soap and water before you change your bandage (dressing). If soap and water are not available, use hand sanitizer. ? Change your dressing as told by your health care provider. ? Leave stitches (sutures), skin glue, or adhesive strips in place. These skin closures may need to stay in place for 2 weeks or longer. If adhesive strip edges start to loosen and curl up, you may trim the loose edges. Do not remove adhesive strips completely unless your health care provider tells you to do that.  Do not take baths, swim, or use a hot tub until your health care provider approves.  You may shower 24-48 hours after the procedure or as told by your health care provider. ? Gently wash the site with plain soap and water. ? Pat the area dry with a clean towel. ? Do not rub the site. This may cause bleeding.  Do not apply powder or lotion to the site. Keep the site clean and dry.  Check your femoral site every day for signs of infection. Check for: ? Redness, swelling, or pain. ? Fluid or blood. ? Warmth. ? Pus or a bad smell. Activity  For the first 2-3 days after your procedure, or as long as directed: ? Avoid climbing stairs as much as possible. ? Do not squat.  Do not lift anything that is heavier than 10 lb (4.5 kg), or the limit that you are told, until your health care provider says that it is safe.  Rest as  directed. ? Avoid sitting for a long time without moving. Get up to take short walks every 1-2 hours.  Do not drive for 24 hours if you were given a medicine to help you relax (sedative). General instructions  Take over-the-counter and prescription medicines only as told by your health care provider.  Keep all follow-up visits as told by your health care provider. This is important. Contact a health care provider if you have:  A fever or chills.  You have redness, swelling, or pain around your insertion site. Get help right away if:  The catheter insertion area swells very fast.  You pass out.  You suddenly start to sweat or your skin gets clammy.  The catheter insertion area is bleeding, and the bleeding does not stop when you hold steady pressure on the area.  The area near or just beyond the catheter insertion site becomes pale, cool, tingly, or numb. These symptoms may represent a serious problem that is an emergency. Do not wait to see if the symptoms will go away. Get medical help right away. Call your local emergency services (911 in the [place] Do not drive yourself to the hospital. Summary  After the procedure, it is common to have bruising that usually fades within 1-2 weeks.  Check your femoral site every day for signs of infection.  Do not lift anything that is heavier than 10 lb (4.5 kg), or   the limit that you are told, until your health care provider says that it is safe. This information is not intended to replace advice given to you by your health care provider. Make sure you discuss any questions you have with your health care provider. Document Revised: 07/11/2020 Document Reviewed: 07/11/2020 Elsevier Patient Education  2021 Elsevier Inc.  

## 2021-03-20 ENCOUNTER — Encounter: Payer: Self-pay | Admitting: Pulmonary Disease

## 2021-03-21 ENCOUNTER — Other Ambulatory Visit: Payer: Self-pay | Admitting: Pulmonary Disease

## 2021-03-30 ENCOUNTER — Encounter: Payer: Self-pay | Admitting: Pulmonary Disease

## 2021-03-30 ENCOUNTER — Ambulatory Visit (INDEPENDENT_AMBULATORY_CARE_PROVIDER_SITE_OTHER): Payer: Medicaid Other | Admitting: Pulmonary Disease

## 2021-03-30 ENCOUNTER — Other Ambulatory Visit: Payer: Self-pay

## 2021-03-30 VITALS — BP 102/64 | HR 74 | Temp 98.2°F | Ht 71.0 in | Wt 231.6 lb

## 2021-03-30 DIAGNOSIS — I5189 Other ill-defined heart diseases: Secondary | ICD-10-CM | POA: Diagnosis not present

## 2021-03-30 DIAGNOSIS — J9611 Chronic respiratory failure with hypoxia: Secondary | ICD-10-CM

## 2021-03-30 DIAGNOSIS — Z87891 Personal history of nicotine dependence: Secondary | ICD-10-CM

## 2021-03-30 DIAGNOSIS — J449 Chronic obstructive pulmonary disease, unspecified: Secondary | ICD-10-CM | POA: Diagnosis not present

## 2021-03-30 MED ORDER — PREDNISONE 10 MG (21) PO TBPK: ORAL_TABLET | ORAL | 0 refills | Status: DC

## 2021-03-30 MED ORDER — BUDESONIDE 0.25 MG/2ML IN SUSP
0.2500 mg | Freq: Two times a day (BID) | RESPIRATORY_TRACT | 11 refills | Status: DC
Start: 2021-03-30 — End: 2021-10-09

## 2021-03-30 NOTE — Patient Instructions (Addendum)
We are sending a request to [facility]  to check you to see if you qualify for portable oxygen concentrator.  Continue using your nebulizers it should be done as follows:  In the mornings: Brovana and Yupelri together, follow with Pulmicort (budesonide)  In the evenings: Brovana follow with Pulmicort   We will see him in follow-up in 2 months time call sooner should any new problems arise.  We sent a prescription for a prednisone pack to your pharmacy.

## 2021-03-30 NOTE — Progress Notes (Signed)
Subjective:    Patient ID: Bradley Sawyer, male    DOB: 1956/09/12, 65 y.o.   MRN: [id number] Chief Complaint  Patient presents with  . Follow-up    Breathing okay as long as on oxygen , sometimes still little winded. Coughing, wheezing , SOB-    HPI This is a 65 year old former smoker, with COPD patient follows up after recent hospitalization for COPD with exacerbation.  This is a scheduled visit.  On 10 February 2021 admitted on 26 February 2021 with saturation and respiratory failure.  He has now been oxygen dependent 24/7.  He was discharged home Brovana, Pulmicort and Yupelri.  These are all via nebulizer.  There was difficulty in obtaining these medications for the patient due to his medical coverage.  These have now been approved.  Though he has been using [place] he did run out of the Pulmicort.  Since he ran out of the Pulmicort he has noted some increased shortness of breath.  He does have a steroid sensitive portion of his disease with bronchospasm.  He is also on ACE inhibitor's from his primary care physician for management of hypertension.  As noted he was discharged on 2-1/2 to 3 L/min via nasal cannula and has noted that the oxygen helps him with his shortness of breath.  He also notes that nebulizers help him but has definitely felt increased dyspnea since his Pulmicort "ran out".  He has not had any chest pain.  2D echo on 11 April showed LVEF of 50 to 55%, gradeII DD, dilated left atrium, dilated right atrium and trivial mitral regurgitation.  He had a left heart cath on 26 April read as being "normal coronaries".  Patient voices no other complaint today.  Overall feels better.   Review of Systems A 10 point review of systems was performed and it is as noted above otherwise negative.  Patient Active Problem List   Diagnosis Date Noted  . Chronic respiratory failure with hypoxia ([place]) 03/30/2021  . COPD exacerbation ([place]) 02/27/2021  . History of CVA (cerebrovascular  accident) 02/27/2021  . Chronic pain, radicular 02/27/2021  . Chronic, continuous use of opioids 02/27/2021  . Unstable angina ([place]) 02/19/2021  . Cigarette nicotine dependence without complication 27/25/3664  . Acute lower UTI 02/19/2017  . UTI (urinary tract infection) 02/19/2017  . Left inguinal hernia 08/12/2016  . Elevated troponin 05/08/2016  . Anal fissure 03/15/2016  . External hemorrhoid 03/15/2016  . Acute viral pharyngitis 03/20/2015  . Olecranon bursitis of left elbow 12/23/2014  . Embolic stroke ([place]) 40/34/7425  . Carotid stenosis 10/15/2014  . 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[place]) 09/19/2014  . 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 PAD (peripheral artery disease) ([place]) 08/23/2014  . Preventative health care 08/12/2014  . Occlusion and stenosis of carotid artery without mention of cerebral infarction 07/25/2014  . Subclavian steal syndrome 07/25/2014  . CAD (coronary artery disease) 07/10/2014  . Nocturia 04/17/2014  . Testicular pain, right 04/17/2014  . Urethral stricture 04/17/2014  . CVA (cerebral infarction) 04/10/2014  . 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 Hematuria 08/06/2013  . Compression fracture of L2, with back and right groin pain 07/18/2013  . History of colon polyps 05/18/2012  . COPD (chronic obstructive pulmonary disease) ([place]) 12/24/2009  . Hyperlipemia 09/20/2006  . TOBACCO ABUSE 09/20/2006  . Essential hypertension 09/20/2006  . GERD 09/20/2006   Social History   Tobacco Use  . Smoking status: Former Smoker  Packs/day: 2.00    Years: 48.00    Pack years: 96.00    Types: Cigarettes    Quit date: 11/22/2020    Years since quitting: 0.3  . Smokeless tobacco: Never Used  Substance Use Topics  . Alcohol use: No    Alcohol/week: 0.0 standard drinks   Allergies  Allergen Reactions  . Bee Venom Swelling   Current Meds  Medication Sig  . albuterol (PROVENTIL) (2.5 MG/3ML) 0.083% nebulizer  solution USE 1 AMPULE IN NEBULIZER EVERY 6 HOURS AS NEEDED.  Marland Kitchen amLODipine (NORVASC) 5 MG tablet Take 5 mg by mouth daily.  Marland Kitchen arformoterol (BROVANA) 15 MCG/2ML NEBU Take 2 mLs (15 mcg total) by nebulization 2 (two) times daily.  Marland Kitchen aspirin EC 81 MG tablet Take 81 mg by mouth daily.  Marland Kitchen atorvastatin (LIPITOR) 80 MG tablet Take 80 mg by mouth daily.  . clopidogrel (PLAVIX) 75 MG tablet Take 75 mg by mouth daily.  Marland Kitchen EPINEPHrine 0.3 mg/0.3 mL IJ SOAJ injection Inject 0.3 mg into the muscle as needed for anaphylaxis.  Marland Kitchen FLUoxetine (PROZAC) 10 MG capsule Take 1 capsule (10 mg total) by mouth daily.  . isosorbide mononitrate (IMDUR) 30 MG 24 hr tablet Take 30 mg by mouth daily.  Marland Kitchen levocetirizine (XYZAL) 5 MG tablet Take 5 mg by mouth at bedtime.  Marland Kitchen linaclotide (LINZESS) 290 MCG CAPS capsule Take 290 mcg by mouth daily before breakfast.  . lisinopril-hydrochlorothiazide (PRINZIDE,ZESTORETIC) 20-12.5 MG per tablet TAKE 1 TABLET BY MOUTH DAILY. (Patient taking differently: Take 1 tablet by mouth daily.)  . metFORMIN (GLUCOPHAGE) 500 MG tablet Take 500 mg by mouth daily.  . montelukast (SINGULAIR) 10 MG tablet Take 10 mg by mouth daily.  . Multiple Vitamin (MULTI-VITAMINS) TABS Take 1 tablet by mouth daily.  . naloxone (NARCAN) nasal spray 4 mg/0.1 mL Place 0.4 mg into the nose once.  . nitroGLYCERIN (NITROSTAT) 0.4 MG SL tablet Place 0.4 mg under the tongue every 5 (five) minutes as needed for chest pain.  Marland Kitchen oxyCODONE (ROXICODONE) 15 MG immediate release tablet Take 15 mg by mouth See admin instructions. Six time a day  . pantoprazole (PROTONIX) 40 MG tablet TAKE 1 TABLET (40 MG TOTAL) BY MOUTH DAILY. (Patient taking differently: Take 40 mg by mouth daily.)  . polyethylene glycol (MIRALAX / GLYCOLAX) packet Take 17 g by mouth daily as needed (for constipation).  . pregabalin (LYRICA) 150 MG capsule Take 150 mg by mouth daily.  Marland Kitchen PROAIR HFA 108 (90 BASE) MCG/ACT inhaler INHALE 2 PUFFS INTO THE LUNGS EVERY 6  (SIX) HOURS AS NEEDED FOR WHEEZING. (Patient taking differently: Inhale 2 puffs into the lungs every 6 (six) hours as needed for shortness of breath or wheezing.)  . revefenacin (YUPELRI) 175 MCG/3ML nebulizer solution Take 3 mLs (175 mcg total) by nebulization daily.  Marland Kitchen rOPINIRole (REQUIP) 1 MG tablet Take 1 mg by mouth at bedtime.  . tamsulosin (FLOMAX) 0.4 MG CAPS capsule TAKE (1) CAPSULE BY MOUTH ONCE DAILY. (Patient taking differently: Take 0.4 mg by mouth daily.)  . [DISCONTINUED] budesonide (PULMICORT) 0.25 MG/2ML nebulizer solution Take 2 mLs (0.25 mg total) by nebulization 2 (two) times daily.   Immunization History  Administered Date(s) Administered  . DTaP 12/04/2018  . Influenza, Seasonal, Injecte, Preservative Fre 08/01/2013, 08/12/2014, 08/26/2015, 08/06/2016, 09/26/2019  . Influenza,inj,Quad PF,6+ Mos 08/01/2013, 08/12/2014  . Influenza-Unspecified 10/30/2018, 09/26/2019, 09/10/2020  . Moderna Sars-Covid-2 Vaccination 02/06/2020, 03/05/2020  . Pneumococcal Polysaccharide-23 10/20/2014  . Pneumococcal-Unspecified 10/20/2014  Objective:   Physical Exam BP 102/64 (BP Location: Left Arm, Patient Position: Sitting, Cuff Size: Normal)   Pulse 74   Temp 98.2 F (36.8 C) (Temporal)   Ht 5\' 11"  (1.803 m)   Wt 231 lb 9.6 oz (105.1 kg)   SpO2 92%   BMI 32.30 kg/m  GENERAL:Well-developed, overweight gentleman, no acute distress. Disheveled. HEAD: Normocephalic, atraumatic.  EYES: Pupils equal, round, reactive to light. No scleral icterus.  MOUTH:Nose/mouth/throat not examined due to masking requirements for COVID 19. NECK: Supple. No thyromegaly. Trachea midline. No JVD. No adenopathy. PULMONARY: Good air entry bilaterally.End expiratory wheezing throughout. CARDIOVASCULAR: S1 and S2. Regular rate and rhythm.No rubs, murmurs or gallops heard. ABDOMEN:Benign. MUSCULOSKELETAL: No joint deformity, no clubbing, no edema.  NEUROLOGIC:No focal deficits, no gait  disturbance. Speech is fluent. SKIN: Intact,warm,dry. PSYCH:Mood and behavior normal.     Assessment & Plan:     ICD-10-CM   1. COPD with chronic bronchitis and emphysema ([place])  J44.9 AMB REFERRAL FOR DME   He has at the very least stage III-IV COPD Continue Brovana, Yupelri and Pulmicort Poorly compensated Prednisone taper pack  2. Chronic respiratory failure with hypoxia (H[facility])  J96.11    On oxygen at 2-1/2 to 3 L/min Continue same Referral for POC  3. Grade II diastolic dysfunction  T01.60    This issue adds complexity to his management May add to his issues with dyspnea  4. Former heavy cigarette smoker (20-39 per day)  Z87.891    No evidence of relapse    Orders Placed This Encounter  Procedures  . AMB REFERRAL FOR DME    Referral Priority:   Routine    Referral Type:   Durable Medical Equipment Purchase    Number of Visits Requested:   1   Meds ordered this encounter  Medications  . budesonide (PULMICORT) 0.25 MG/2ML nebulizer solution    Sig: Take 2 mLs (0.25 mg total) by nebulization 2 (two) times daily.    Dispense:  120 mL    Refill:  11  . predniSONE (STERAPRED UNI-PAK 21 TAB) 10 MG (21) TBPK tablet    Sig: Use as directed in the package.  This is a taper pack.    Dispense:  21 tablet    Refill:  0   We will see the patient in follow-up in 2 months time he is to contact us prior to that time should any new difficulties arise  C. Derrill Kay, MD [facility]   *This note was dictated using voice recognition software/Dragon.  Despite best efforts to proofread, errors can occur which can change the meaning.  Any change was purely unintentional.

## 2021-03-31 ENCOUNTER — Telehealth: Payer: Self-pay | Admitting: Pulmonary Disease

## 2021-03-31 NOTE — Telephone Encounter (Signed)
Spoke to patient, who stated that he received a call from [facility] and was told that he would have to purchase a POC out of pocket since he has been  on oxygen >45d.  Patient does not think that he has been on oxygen for more then 45 days.   Spoke to [place], who stated that per [employer]'s records patient has been setup on oxygen since 2020. Lenna Sciara is going to verify this information and call back with update.

## 2021-03-31 NOTE — Telephone Encounter (Signed)
Lm x1 for patient.  

## 2021-04-01 NOTE — Telephone Encounter (Signed)
Spoke to Bradley Sawyer, who stated that [employer] has been billing patient for oxygen since 2020. Per Bradley Sawyer patient can go into [facility] for best for eval for possible ml6 tanks.   I have spoken to patient and relayed this information. Patient would like to purchase inogen with [facility].  Nothing further needed at this time.

## 2021-04-01 NOTE — Telephone Encounter (Signed)
Spoke to patient and confirmed that he is in the process of purchasing an inogen.  I have spoken to [place] with [facility], who is requesting last OV notes.  OV notes have been faxed to provided fax number.  Received fax confirmation.  Nothing further needed at this time.

## 2021-05-21 ENCOUNTER — Telehealth: Payer: Self-pay | Admitting: Pulmonary Disease

## 2021-05-21 DIAGNOSIS — J449 Chronic obstructive pulmonary disease, unspecified: Secondary | ICD-10-CM

## 2021-05-21 NOTE — Telephone Encounter (Signed)
Spoke to [place]  with [facility], who is requesting surgical clearance for hernia removal.  Surgery date is TBD.  Dr. Patsey Berthold, please advise. Thanks

## 2021-05-22 NOTE — Telephone Encounter (Signed)
He has a follow-up appointment with me July 21.  Try to get PFTs prior to that.  Presurgical evaluation can be done at that time.

## 2021-05-22 NOTE — Telephone Encounter (Signed)
Patient is aware of below recommendations and voiced his understanding.  He is agreeable with PFT. PFT has been ordered. First available PFT is after 7/21 and surgery will be around the first week of August.  Lm for April with [facility].     Routing to Dr. Patsey Berthold as an Juluis [facility]ier.

## 2021-05-22 NOTE — Telephone Encounter (Signed)
PFT scheduled for 06/17/2021.  Hubbard Robinson with [facility] is aware of upcoming appts and voiced her understanding.  Nothing further needed at this time.

## 2021-06-04 ENCOUNTER — Other Ambulatory Visit: Payer: Self-pay | Admitting: Pulmonary Disease

## 2021-06-11 ENCOUNTER — Encounter: Payer: Self-pay | Admitting: Pulmonary Disease

## 2021-06-11 ENCOUNTER — Other Ambulatory Visit: Payer: Self-pay

## 2021-06-11 ENCOUNTER — Ambulatory Visit (INDEPENDENT_AMBULATORY_CARE_PROVIDER_SITE_OTHER): Payer: Medicaid Other | Admitting: Pulmonary Disease

## 2021-06-11 VITALS — BP 126/82 | HR 67 | Temp 98.3°F | Ht 71.0 in | Wt 237.8 lb

## 2021-06-11 DIAGNOSIS — J9611 Chronic respiratory failure with hypoxia: Secondary | ICD-10-CM | POA: Diagnosis not present

## 2021-06-11 DIAGNOSIS — J449 Chronic obstructive pulmonary disease, unspecified: Secondary | ICD-10-CM

## 2021-06-11 DIAGNOSIS — Z87891 Personal history of nicotine dependence: Secondary | ICD-10-CM

## 2021-06-11 DIAGNOSIS — I5189 Other ill-defined heart diseases: Secondary | ICD-10-CM

## 2021-06-11 MED ORDER — ARFORMOTEROL TARTRATE 15 MCG/2ML IN NEBU: 15.0000 ug | INHALATION_SOLUTION | Freq: Two times a day (BID) | RESPIRATORY_TRACT | 11 refills | Status: DC

## 2021-06-11 NOTE — Progress Notes (Signed)
Subjective:    Patient ID: Bradley Sawyer, male    DOB: 1956/06/14, 65 y.o.   MRN: [id number] Chief Complaint  Patient presents with   Follow-up    Coughing with occasional production. Sob, wheezing.    HPI Katie is a 65 year old former smoker (68 PY) who presents for follow-up on dyspnea in the setting of COPD and chronic respiratory failure.  He is oxygen dependent 24/7.  He is currently on Brovana Pulmicort and Yupelri.  Since he has been able to get these medications covered he is doing very well.  He feels less short of breath.  He is still dismayed that he has to wear oxygen.  He is investigating potential pulmonary valve placement (bronchoscopic lung volume reduction) however I did explain to him that his disease process may not be optimal for valve placement (emphysema is more homogeneous) however he may be eligible for total lung denervation study.  We provided him with information on that.  He is eager to pursue these possibilities.  With regards to cough and sputum production this is markedly improved since he started on Brovana Pulmicort and Yupelri.  He is eager to get better so that he can do some part-time work.  He does not endorse any other symptomatology today.  He has had no chest pain, lower extremity edema, orthopnea or paroxysmal nocturnal dyspnea.  No calf tenderness or edema.  No palpitations.  No fevers, chills or sweats.   Review of Systems A 10 point review of systems was performed and it is as noted above otherwise negative.  Patient Active Problem List   Diagnosis Date Noted   Chronic respiratory failure with hypoxia ([place]) 03/30/2021   COPD exacerbation ([place]) 02/27/2021   History of CVA (cerebrovascular accident) 02/27/2021   Chronic pain, radicular 02/27/2021   Chronic, continuous use of opioids 02/27/2021   Unstable angina ([place]) 02/19/2021   Cigarette nicotine dependence without complication 24/23/5361   Acute lower UTI 02/19/2017   UTI (urinary tract  infection) 02/19/2017   Left inguinal hernia 08/12/2016   Elevated troponin 05/08/2016   Anal fissure 03/15/2016   External hemorrhoid 03/15/2016   Acute viral pharyngitis 03/20/2015   Olecranon bursitis of left elbow 44/31/5400   Embolic stroke ([place]) 86/76/1950   Carotid stenosis 10/15/2014   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   PAD (peripheral artery disease) ([place]) 08/23/2014   Preventative health care 08/12/2014   Occlusion and stenosis of carotid artery without mention of cerebral infarction 07/25/2014   Subclavian steal syndrome 07/25/2014   CAD (coronary artery disease) 07/10/2014   Nocturia 04/17/2014   Testicular pain, right 04/17/2014   Urethral stricture 04/17/2014   CVA (cerebral infarction) 04/10/2014   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   Hematuria 08/06/2013   Compression fracture of L2, with back and right groin pain 07/18/2013   History of colon polyps 05/18/2012   COPD (chronic obstructive pulmonary disease) ([place]) 12/24/2009   Hyperlipemia 09/20/2006   TOBACCO ABUSE 09/20/2006   Essential hypertension 09/20/2006   GERD 09/20/2006   Social History   Tobacco Use   Smoking status: Former    Packs/day: 2.00    Years: 48.00    Pack years: 96.00    Types: Cigarettes    Quit date: 11/22/2020    Years since quitting: 0.5   Smokeless tobacco: Never  Substance Use Topics   Alcohol use: No    Alcohol/week: 0.0 standard drinks   Allergies  Allergen Reactions   Bee Venom Swelling   Current Meds  Medication Sig   albuterol (PROVENTIL) (2.5 MG/3ML) 0.083% nebulizer solution USE 1 AMPULE IN NEBULIZER EVERY 6 HOURS AS NEEDED.   amLODipine (NORVASC) 5 MG tablet Take 5 mg by mouth daily.   aspirin EC 81 MG tablet Take 81 mg by mouth daily.   atorvastatin (LIPITOR) 80 MG tablet Take 80 mg by mouth daily.   budesonide (PULMICORT) 0.25 MG/2ML nebulizer solution Take 2 mLs  (0.25 mg total) by nebulization 2 (two) times daily.   clopidogrel (PLAVIX) 75 MG tablet Take 75 mg by mouth daily.   cyclobenzaprine (FLEXERIL) 10 MG tablet Take by mouth.   EPINEPHrine 0.3 mg/0.3 mL IJ SOAJ injection Inject 0.3 mg into the muscle as needed for anaphylaxis.   FLUoxetine (PROZAC) 20 MG capsule Take 20 mg by mouth daily.   FLUoxetine (PROZAC) 20 MG capsule TAKE (1) CAPSULE BY MOUTH ONCE DAILY.   isosorbide mononitrate (IMDUR) 30 MG 24 hr tablet Take 30 mg by mouth daily.   levocetirizine (XYZAL) 5 MG tablet Take 5 mg by mouth at bedtime.   linaclotide (LINZESS) 290 MCG CAPS capsule Take 290 mcg by mouth daily before breakfast.   lisinopril-hydrochlorothiazide (ZESTORETIC) 20-25 MG tablet Take 1 tablet by mouth daily.   meloxicam (MOBIC) 15 MG tablet TAKE 1 TABLET IN THE MORNING WITH FOOD   metFORMIN (GLUCOPHAGE) 500 MG tablet Take 500 mg by mouth daily.   montelukast (SINGULAIR) 10 MG tablet Take 10 mg by mouth daily.   Multiple Vitamin (MULTI-VITAMINS) TABS Take 1 tablet by mouth daily.   naloxone (NARCAN) nasal spray 4 mg/0.1 mL Place 0.4 mg into the nose once.   nitroGLYCERIN (NITROSTAT) 0.4 MG SL tablet Place 0.4 mg under the tongue every 5 (five) minutes as needed for chest pain.   Olopatadine HCl 0.2 % SOLN INSTILL 1 DROPS INTO EACH EYE ONCE DAILY AS NEEDED FOR ITCHY EYES.   oxyCODONE (ROXICODONE) 15 MG immediate release tablet Take by mouth.   pantoprazole (PROTONIX) 40 MG tablet Take 1 tablet by mouth daily.   polyethylene glycol (MIRALAX / GLYCOLAX) packet Take 17 g by mouth daily as needed (for constipation).   predniSONE (STERAPRED UNI-PAK 21 TAB) 10 MG (21) TBPK tablet Use as directed in the package.  This is a taper pack.   pregabalin (LYRICA) 150 MG capsule Take 150 mg by mouth daily.   PROAIR HFA 108 (90 BASE) MCG/ACT inhaler INHALE 2 PUFFS INTO THE LUNGS EVERY 6 (SIX) HOURS AS NEEDED FOR WHEEZING. (Patient taking differently: Inhale 2 puffs into the lungs every 6  (six) hours as needed for shortness of breath or wheezing.)   rOPINIRole (REQUIP) 1 MG tablet Take 1 tablet by mouth at bedtime.   spironolactone (ALDACTONE) 25 MG tablet Take 25 mg by mouth daily.   tamsulosin (FLOMAX) 0.4 MG CAPS capsule TAKE (1) CAPSULE BY MOUTH ONCE DAILY. (Patient taking differently: Take 0.4 mg by mouth daily.)   YUPELRI 175 MCG/3ML nebulizer solution Inhale into the lungs.       Objective:   Physical Exam BP 126/82 (BP Location: Left Arm, Patient Position: Sitting, Cuff Size: Normal)   Pulse 67   Temp 98.3 F (36.8 C) (Oral)   Ht 5\' 11"  (1.803 m)   Wt 237 lb 12.8 oz (107.9 kg)   SpO2 93%   BMI 33.17 kg/m  GENERAL: Well-developed, overweight gentleman, no acute distress. Disheveled.  Comfortable on nasal cannula/POC. HEAD: Normocephalic, atraumatic. EYES: Pupils equal,  round, reactive to light.  No scleral icterus. MOUTH: Nose/mouth/throat not examined due to masking requirements for COVID 19. NECK: Supple. No thyromegaly. Trachea midline. No JVD.  No adenopathy. PULMONARY: Good air entry bilaterally.  Coarse breath sounds but no wheezes noted.  Moving air well. CARDIOVASCULAR: S1 and S2. Regular rate and rhythm.  No rubs, murmurs or gallops heard. ABDOMEN: Benign. MUSCULOSKELETAL: No joint deformity, no clubbing, no edema. NEUROLOGIC: No focal deficits, no gait disturbance.  Speech is fluent. SKIN: Intact,warm,dry. PSYCH: Mood and behavior normal.     Assessment & Plan:     ICD-10-CM   1. COPD with chronic bronchitis and emphysema ([place])  J44.9    He is well compensated on current regimen Continue Brovana, Pulmicort and Yupelri Follow-up 3 months time    2. Chronic respiratory failure with hypoxia (HCC)  J96.11    Continue oxygen at 3 L/min Patient wants to explore other potential interventions    3. Grade II diastolic dysfunction  L57.26    This issue adds complexity to his management Continue follow-up with cardiology, Dr. Humphrey Rolls    4. Former  heavy cigarette smoker (20-39 per day)  Z87.891    No evidence of relapse     Meds ordered this encounter  Medications   arformoterol (BROVANA) 15 MCG/2ML NEBU    Sig: Take 2 mLs (15 mcg total) by nebulization 2 (two) times daily.    Dispense:  120 mL    Refill:  11   Smoking cessation instruction/counseling given:  commended patient for quitting and reviewed strategies for preventing relapses  Discussion:  Jahzeel appears to be doing markedly better on his current regimen.  We encouraged him to continue doing the same.  He was commended on his continued abstinence from cigarettes.  We will see him in follow-up in 3 months time.  He is to contact us prior to that time should any new difficulties arise.   Renold Don, MD [facility]ch[facility]   *This note was dictated using voice recognition software/Dragon.  Despite best efforts to proofread, errors can occur which can change the meaning.  Any change was purely unintentional.

## 2021-06-11 NOTE — Patient Instructions (Signed)
Continue using your nebulizer machine and medications.  Your lungs sounded really good today.   We will see you in follow-up in 3 months time call sooner should any new problems arise.

## 2021-06-12 ENCOUNTER — Telehealth: Payer: Self-pay

## 2021-06-12 NOTE — Telephone Encounter (Addendum)
Called and spoke to patient about upcoming Covid test. Pt had a clear understanding. Pt would also like another copy of the COPD packet that Dr Patsey Berthold gave to him, pt will pick this up on Tuesday after his Covid test. Nothing further needed.

## 2021-06-12 NOTE — Telephone Encounter (Signed)
Noted, its information on Total Lung Denervation study at [facility].

## 2021-06-12 NOTE — Telephone Encounter (Signed)
When calling patient earlier in regards to his upcoming COVID test he stated he would like another copy of the COPD information you gave him yesterday 7/21. Where is this information located so I can place this up front for him to pick up after his test?

## 2021-06-15 NOTE — Telephone Encounter (Signed)
Created in error

## 2021-06-16 ENCOUNTER — Other Ambulatory Visit: Payer: Self-pay

## 2021-06-16 ENCOUNTER — Other Ambulatory Visit
Admission: RE | Admit: 2021-06-16 | Discharge: 2021-06-16 | Disposition: A | Discharge status: Home / Self Care | Payer: Medicaid Other | Source: Ambulatory Visit | Attending: Pulmonary Disease | Admitting: Pulmonary Disease

## 2021-06-16 DIAGNOSIS — Z20822 Contact with and (suspected) exposure to covid-19: Secondary | ICD-10-CM | POA: Diagnosis not present

## 2021-06-16 DIAGNOSIS — Z01812 Encounter for preprocedural laboratory examination: Secondary | ICD-10-CM | POA: Insufficient documentation

## 2021-06-16 LAB — SARS CORONAVIRUS 2 (TAT 6-24 HRS): SARS Coronavirus 2: NEGATIVE

## 2021-06-17 ENCOUNTER — Ambulatory Visit: Payer: Medicaid Other | Attending: Pulmonary Disease

## 2021-06-17 DIAGNOSIS — J449 Chronic obstructive pulmonary disease, unspecified: Secondary | ICD-10-CM | POA: Insufficient documentation

## 2021-06-18 ENCOUNTER — Telehealth: Payer: Self-pay | Admitting: Pulmonary Disease

## 2021-06-18 NOTE — Telephone Encounter (Signed)
Spoke to [place] with [facility], who is requesting surgical clearance for hernia repair.  Hubbard Robinson will fax over clearance form.  Dr. Patsey Berthold, please advise. Thanks

## 2021-06-18 NOTE — Telephone Encounter (Signed)
Lm for Bradley Sawyer with [facility].

## 2021-06-18 NOTE — Telephone Encounter (Signed)
Lm for [place].

## 2021-06-18 NOTE — Telephone Encounter (Signed)
Bradley Sawyer is returning phone call. Pls regard; [phone]

## 2021-06-22 NOTE — Telephone Encounter (Signed)
Surgical clearance has completed and faxed to [facility].   Lm for Bradley Sawyer to make her aware. Nothing further needed at this time.

## 2021-06-26 ENCOUNTER — Other Ambulatory Visit: Payer: Self-pay | Admitting: Pulmonary Disease

## 2021-07-07 ENCOUNTER — Telehealth: Payer: Self-pay | Admitting: Pulmonary Disease

## 2021-07-08 ENCOUNTER — Telehealth: Payer: Self-pay

## 2021-07-08 NOTE — Telephone Encounter (Signed)
Spoke w/ patient's daughter -in-law(dpr) and let her know that the request for authorization has been placed with the insurance company.

## 2021-07-08 NOTE — Telephone Encounter (Signed)
Called pt's pharmacy to check on Rx as it was sent to the pharmacy for pt 7/21 after pt's OV.  Per pharmacy, the Rx is requiring a PA to be done.  Marissa, please advise if you have received PA on pt.

## 2021-07-08 NOTE — Telephone Encounter (Signed)
PA for Arformetrol tartrate faxed over to [facility] in [place] nothing further needed.

## 2021-07-08 NOTE — Telephone Encounter (Signed)
Spoke with Marissa on the phone and asked her to fax me the PA form for this patient so that we could get it started as patient is currently out of medication.   PA request was received from (pharmacy): Cover my meds Phone: Fax:  Medication name and strength: Arformoterol Tartrate 15 mcg/ 2 ML Ordering Provider: Dr. Patsey Berthold   Was PA started with [facility]?: Yes If yes, please enter KEY: [id number] Medication tried and failed:  Covered Alternatives:   PA sent to plan, time frame for approval / denial: 48-72 hours marked as urgent since patient is out Routing to myself for follow-up    ATC [name] at [phone] went straight to voicemail, [facility]

## 2021-07-09 NOTE — Telephone Encounter (Signed)
This request was denied under your Medicare Part D benefit; however, coverage for the requested drug(s) has been approved under Medicare Part B. [employer] has approved coverage for your drug under your Part B benefit for/through 11-21-21. Called and spoke with pharmacy to let them know and they were able to get it to go through. Called and spoke with patient to let him know and he expressed understanding. Nothing further needed at this time.

## 2021-07-30 ENCOUNTER — Other Ambulatory Visit
Admission: RE | Admit: 2021-07-30 | Discharge: 2021-07-30 | Disposition: A | Discharge status: Home / Self Care | Payer: Medicare HMO | Source: Ambulatory Visit | Attending: Pulmonary Disease | Admitting: Pulmonary Disease

## 2021-07-30 ENCOUNTER — Other Ambulatory Visit: Payer: Self-pay

## 2021-07-30 ENCOUNTER — Ambulatory Visit (INDEPENDENT_AMBULATORY_CARE_PROVIDER_SITE_OTHER): Payer: Medicare HMO | Admitting: Pulmonary Disease

## 2021-07-30 ENCOUNTER — Encounter: Payer: Self-pay | Admitting: Pulmonary Disease

## 2021-07-30 VITALS — BP 126/72 | HR 71 | Temp 97.8°F | Ht 71.0 in | Wt 230.0 lb

## 2021-07-30 DIAGNOSIS — Z87891 Personal history of nicotine dependence: Secondary | ICD-10-CM | POA: Diagnosis not present

## 2021-07-30 DIAGNOSIS — J9611 Chronic respiratory failure with hypoxia: Secondary | ICD-10-CM

## 2021-07-30 DIAGNOSIS — J441 Chronic obstructive pulmonary disease with (acute) exacerbation: Secondary | ICD-10-CM

## 2021-07-30 DIAGNOSIS — I5189 Other ill-defined heart diseases: Secondary | ICD-10-CM

## 2021-07-30 DIAGNOSIS — Z01811 Encounter for preprocedural respiratory examination: Secondary | ICD-10-CM

## 2021-07-30 LAB — CBC WITH DIFFERENTIAL/PLATELET
Abs Immature Granulocytes: 0.03 10*3/uL (ref 0.00–0.07)
Basophils Absolute: 0.1 10*3/uL (ref 0.0–0.1)
Basophils Relative: 1 %
Eosinophils Absolute: 0.5 10*3/uL (ref 0.0–0.5)
Eosinophils Relative: 6 %
HCT: 34.4 % — ABNORMAL LOW (ref 39.0–52.0)
Hemoglobin: 11.9 g/dL — ABNORMAL LOW (ref 13.0–17.0)
Immature Granulocytes: 0 %
Lymphocytes Relative: 33 %
Lymphs Abs: 2.8 10*3/uL (ref 0.7–4.0)
MCH: 31.9 pg (ref 26.0–34.0)
MCHC: 34.6 g/dL (ref 30.0–36.0)
MCV: 92.2 fL (ref 80.0–100.0)
Monocytes Absolute: 0.8 10*3/uL (ref 0.1–1.0)
Monocytes Relative: 10 %
Neutro Abs: 4.2 10*3/uL (ref 1.7–7.7)
Neutrophils Relative %: 50 %
Platelets: 179 10*3/uL (ref 150–400)
RBC: 3.73 MIL/uL — ABNORMAL LOW (ref 4.22–5.81)
RDW: 12.1 % (ref 11.5–15.5)
WBC: 8.3 10*3/uL (ref 4.0–10.5)
nRBC: 0 % (ref 0.0–0.2)

## 2021-07-30 MED ORDER — AZITHROMYCIN 250 MG PO TABS: ORAL_TABLET | ORAL | 0 refills | Status: AC

## 2021-07-30 MED ORDER — PREDNISONE 10 MG (21) PO TBPK: ORAL_TABLET | ORAL | 0 refills | Status: DC

## 2021-07-30 NOTE — Patient Instructions (Signed)
We are sending in some prednisone and an antibiotic for you to your local pharmacy.  We have ordered some blood work.  Have the blood work done before you start the prednisone.  We will see you in follow-up in 4 to 6 weeks time call sooner should any new problems arise.  You may see me or the nurse practitioner at that time.

## 2021-07-30 NOTE — Progress Notes (Signed)
Subjective:    Patient ID: Bradley Sawyer, male    DOB: Dec 27, 1955, 65 y.o.   MRN: [id number] Chief Complaint  Patient presents with   Follow-up    C/o sob with exertion, prod cough with clear sputum and wheezing.     HPI Is a 65 year old former smoker (62 PY) who presents for follow-up on the issue of dyspnea in the setting of COPD with asthma overlap.  Patient is oxygen dependent 24/7 due to chronic respiratory failure with hypoxia.  He is on 3 L/min nasal cannula O2.  He is currently maintained on Brovana, Pulmicort and Yupelri via nebulizer.  He is actually doing fairly well with the nebulizer therapy as he does not have enough breath-holding capacity to use MDIs or DPI's effectively.  He has a minor "flare" of his sputum production over the last few days.  No fevers, chills or sweats.  He always has issues with wheezing and bronchospasm.  He has had no chest pain, lower extremity edema, orthopnea or paroxysmal nocturnal dyspnea.  No calf tenderness or edema.  No palpitations.  He is to have inguinal hernia repair at [facility] in [place].  Needs preoperative evaluation.  He does not endorse any other issues today.  Review of Systems A 10 point review of systems was performed and it is as noted above otherwise negative.  Patient Active Problem List   Diagnosis Date Noted   Chronic respiratory failure with hypoxia ([place]) 03/30/2021   COPD exacerbation ([place]) 02/27/2021   History of CVA (cerebrovascular accident) 02/27/2021   Chronic pain, radicular 02/27/2021   Chronic, continuous use of opioids 02/27/2021   Unstable angina ([place]) 02/19/2021   Cigarette nicotine dependence without complication [id number]   Acute lower UTI 02/19/2017   UTI (urinary tract infection) 02/19/2017   Left inguinal hernia 08/12/2016   Elevated troponin 05/08/2016   Anal fissure 03/15/2016   External hemorrhoid 03/15/2016   Acute viral pharyngitis 03/20/2015   Olecranon bursitis of left elbow [id number]    Embolic stroke ([place]) [id number]   Carotid stenosis 10/15/2014   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[place]) 09/19/2014   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   PAD (peripheral artery disease) ([place]) 08/23/2014   Preventative health care 08/12/2014   Occlusion and stenosis of carotid artery without mention of cerebral infarction 07/25/2014   Subclavian steal syndrome 07/25/2014   CAD (coronary artery disease) 07/10/2014   Nocturia 04/17/2014   Testicular pain, right 04/17/2014   Urethral stricture 04/17/2014   CVA (cerebral infarction) 04/10/2014   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   Hematuria 08/06/2013   Compression fracture of L2, with back and right groin pain 07/18/2013   History of colon polyps 05/18/2012   COPD (chronic obstructive pulmonary disease) ([place]) 12/24/2009   Hyperlipemia 09/20/2006   TOBACCO ABUSE 09/20/2006   Essential hypertension 09/20/2006   GERD 09/20/2006   Social History   Tobacco Use   Smoking status: Former    Packs/day: 2.00    Years: 48.00    Pack years: 96.00    Types: Cigarettes    Quit date: 11/22/2020    Years since quitting: 0.6   Smokeless tobacco: Never  Substance Use Topics   Alcohol use: No    Alcohol/week: 0.0 standard drinks   Allergies  Allergen Reactions   Bee Venom Swelling   Current Meds  Medication Sig   albuterol (PROVENTIL) (2.5 MG/3ML) 0.083% nebulizer solution USE 1 AMPULE IN NEBULIZER EVERY 6 HOURS AS NEEDED.   amLODipine (NORVASC)  5 MG tablet Take 5 mg by mouth daily.   arformoterol (BROVANA) 15 MCG/2ML NEBU Take 2 mLs (15 mcg total) by nebulization 2 (two) times daily.   aspirin EC 81 MG tablet Take 81 mg by mouth daily.   atorvastatin (LIPITOR) 80 MG tablet Take 80 mg by mouth daily.   budesonide (PULMICORT) 0.25 MG/2ML nebulizer solution Take 2 mLs (0.25 mg total) by nebulization 2 (two) times daily.   clopidogrel (PLAVIX) 75 MG tablet Take 75 mg by mouth daily.   EPINEPHrine  0.3 mg/0.3 mL IJ SOAJ injection Inject 0.3 mg into the muscle as needed for anaphylaxis.   FLUoxetine (PROZAC) 20 MG capsule Take 20 mg by mouth daily.   isosorbide mononitrate (IMDUR) 30 MG 24 hr tablet Take 30 mg by mouth daily.   levocetirizine (XYZAL) 5 MG tablet Take 5 mg by mouth at bedtime.   linaclotide (LINZESS) 290 MCG CAPS capsule Take 290 mcg by mouth daily before breakfast.   lisinopril-hydrochlorothiazide (ZESTORETIC) 20-25 MG tablet Take 1 tablet by mouth daily.   meloxicam (MOBIC) 15 MG tablet TAKE 1 TABLET IN THE MORNING WITH FOOD   metFORMIN (GLUCOPHAGE) 500 MG tablet Take 500 mg by mouth daily.   montelukast (SINGULAIR) 10 MG tablet Take 10 mg by mouth daily.   Multiple Vitamin (MULTI-VITAMINS) TABS Take 1 tablet by mouth daily.   naloxone (NARCAN) nasal spray 4 mg/0.1 mL Place 0.4 mg into the nose once.   nitroGLYCERIN (NITROSTAT) 0.4 MG SL tablet Place 0.4 mg under the tongue every 5 (five) minutes as needed for chest pain.   Olopatadine HCl 0.2 % SOLN INSTILL 1 DROPS INTO EACH EYE ONCE DAILY AS NEEDED FOR ITCHY EYES.   oxyCODONE (ROXICODONE) 15 MG immediate release tablet Take by mouth.   pantoprazole (PROTONIX) 40 MG tablet Take 1 tablet by mouth daily.   polyethylene glycol (MIRALAX / GLYCOLAX) packet Take 17 g by mouth daily as needed (for constipation).   pregabalin (LYRICA) 150 MG capsule Take 150 mg by mouth daily.   PROAIR HFA 108 (90 BASE) MCG/ACT inhaler INHALE 2 PUFFS INTO THE LUNGS EVERY 6 (SIX) HOURS AS NEEDED FOR WHEEZING. (Patient taking differently: Inhale 2 puffs into the lungs every 6 (six) hours as needed for shortness of breath or wheezing.)   rOPINIRole (REQUIP) 1 MG tablet Take 1 tablet by mouth at bedtime.   spironolactone (ALDACTONE) 25 MG tablet Take 25 mg by mouth daily.   tamsulosin (FLOMAX) 0.4 MG CAPS capsule TAKE (1) CAPSULE BY MOUTH ONCE DAILY. (Patient taking differently: Take 0.4 mg by mouth daily.)   YUPELRI 175 MCG/3ML nebulizer solution  Inhale into the lungs.   Immunization History  Administered Date(s) Administered   DTaP 12/04/2018   Influenza, Seasonal, Injecte, Preservative Fre 08/01/2013, 08/12/2014, 08/26/2015, 08/06/2016, 09/26/2019   Influenza,inj,Quad PF,6+ Mos 08/01/2013, 08/12/2014   Influenza-Unspecified 10/30/2018, 09/26/2019, 09/10/2020   Moderna Sars-Covid-2 Vaccination 02/06/2020, 03/05/2020   Pneumococcal Polysaccharide-23 10/20/2014   Pneumococcal-Unspecified 10/20/2014       Objective:   Physical Exam BP 126/72 (BP Location: Left Arm, Cuff Size: Normal)   Pulse 71   Temp 97.8 F (36.6 C) (Temporal)   Ht '5\' 11"'$  (1.803 m)   Wt 230 lb (104.3 kg)   SpO2 93%   BMI 32.08 kg/m  GENERAL: Well-developed, overweight gentleman, no acute distress. Disheveled.  Comfortable on nasal cannula/POC. HEAD: Normocephalic, atraumatic. EYES: Pupils equal, round, reactive to light.  No scleral icterus. MOUTH: Nose/mouth/throat not examined due to masking requirements for COVID  19. NECK: Supple. No thyromegaly. Trachea midline. No JVD.  No adenopathy. PULMONARY: Good air entry bilaterally.  Coarse breath sounds, faint expiratory wheezes noted.  CARDIOVASCULAR: S1 and S2. Regular rate and rhythm.  No rubs, murmurs or gallops heard. ABDOMEN: Benign. MUSCULOSKELETAL: No joint deformity, no clubbing, no edema. NEUROLOGIC: No focal deficits, no gait disturbance.  Speech is fluent. SKIN: Intact,warm,dry. PSYCH: Mood and behavior normal.       Assessment & Plan:     ICD-10-CM   1. COPD with acute exacerbation (HCC)  J44.1 Allergen Panel (27) + IGE    Alpha-1 antitrypsin phenotype    CBC w/Diff    CANCELED: CBC w/Diff   Flare of chronic bronchitis He always has bronchospasm Optimize with prednisone and azithromycin     2. Chronic respiratory failure with hypoxia (HCC)  J96.11    Continue oxygen at 3 L/min    3. Grade II diastolic dysfunction  123XX123    This issue adds complexity to his management Adds to  his sensation of dyspnea    4. Former heavy cigarette smoker (20-39 per day)  Z87.891    No evidence of relapse    5. Preop respiratory exam  Z01.811    From the pulmonary standpoint he is a moderate to high risk Patient is as optimized as he is to get Recommend inpatient pulmonary consultation if needed     Orders Placed This Encounter  Procedures   CBC w/Diff    Standing Status:   Future    Standing Expiration Date:   07/30/2022   Allergen Panel (27) + IGE    Standing Status:   Future    Standing Expiration Date:   07/30/2022   Alpha-1 antitrypsin phenotype    Standing Status:   Future    Standing Expiration Date:   07/30/2022   Meds ordered this encounter  Medications   azithromycin (ZITHROMAX) 250 MG tablet    Sig: Take 2 tablets (500 mg) on  Day 1,  followed by 1 tablet (250 mg) once daily on Days 2 through 5.    Dispense:  6 each    Refill:  0   predniSONE (STERAPRED UNI-PAK 21 TAB) 10 MG (21) TBPK tablet    Sig: Take as directed in the package    Dispense:  21 tablet    Refill:  0   The patient in follow-up in 4 to 6 weeks time he is to contact us prior to that time should any new difficulties arise.  Renold Don, MD Advanced Bronchoscopy [facility]    *This note was dictated using voice recognition software/Dragon.  Despite best efforts to proofread, errors can occur which can change the meaning.  Any change was purely unintentional.

## 2021-08-01 LAB — ALPHA-1-ANTITRYPSIN: A-1 Antitrypsin, Ser: 122 mg/dL (ref 101–187)

## 2021-08-06 LAB — ALLERGEN PANEL (27) + IGE
Alternaria Alternata IgE: <0.1 kU/L
Aspergillus Fumigatus IgE: <0.1 kU/L
Bahia Grass IgE: <0.1 kU/L
Bermuda Grass IgE: <0.1 kU/L
Cat Dander IgE: <0.1 kU/L
Cedar, Mountain IgE: <0.1 kU/L
Cladosporium Herbarum IgE: <0.1 kU/L
Cocklebur IgE: <0.1 kU/L
Cockroach, American IgE: <0.1 kU/L
Common Silver Birch IgE: <0.1 kU/L
D Farinae IgE: <0.1 kU/L
D Pteronyssinus IgE: <0.1 kU/L
Dog Dander IgE: 0.17 kU/L — AB
Elm, American IgE: <0.1 kU/L
Hickory, White IgE: <0.1 kU/L
IgE (Immunoglobulin E), Serum: 196 IU/mL (ref 6–495)
Johnson Grass IgE: <0.1 kU/L
Kentucky Bluegrass IgE: <0.1 kU/L
Maple/Box Elder IgE: <0.1 kU/L
Mucor Racemosus IgE: <0.1 kU/L
Oak, White IgE: <0.1 kU/L
Penicillium Chrysogen IgE: <0.1 kU/L
Pigweed, Rough IgE: <0.1 kU/L
Plantain, English IgE: <0.1 kU/L
Ragweed, Short IgE: <0.1 kU/L
Setomelanomma Rostrat: <0.1 kU/L
Timothy Grass IgE: <0.1 kU/L
White Mulberry IgE: <0.1 kU/L

## 2021-08-13 IMAGING — CR CHEST - 2 VIEW
1 series · 2 of 2 positions shown · non-contrast
Comparison: 05/08/2016

CLINICAL DATA: Dyspnea

EXAM:
CHEST - 2 VIEW

[Series 1: dg chest 2 view · 0.14mm/px · 2 of 2 slices shown]
[im 1/2]
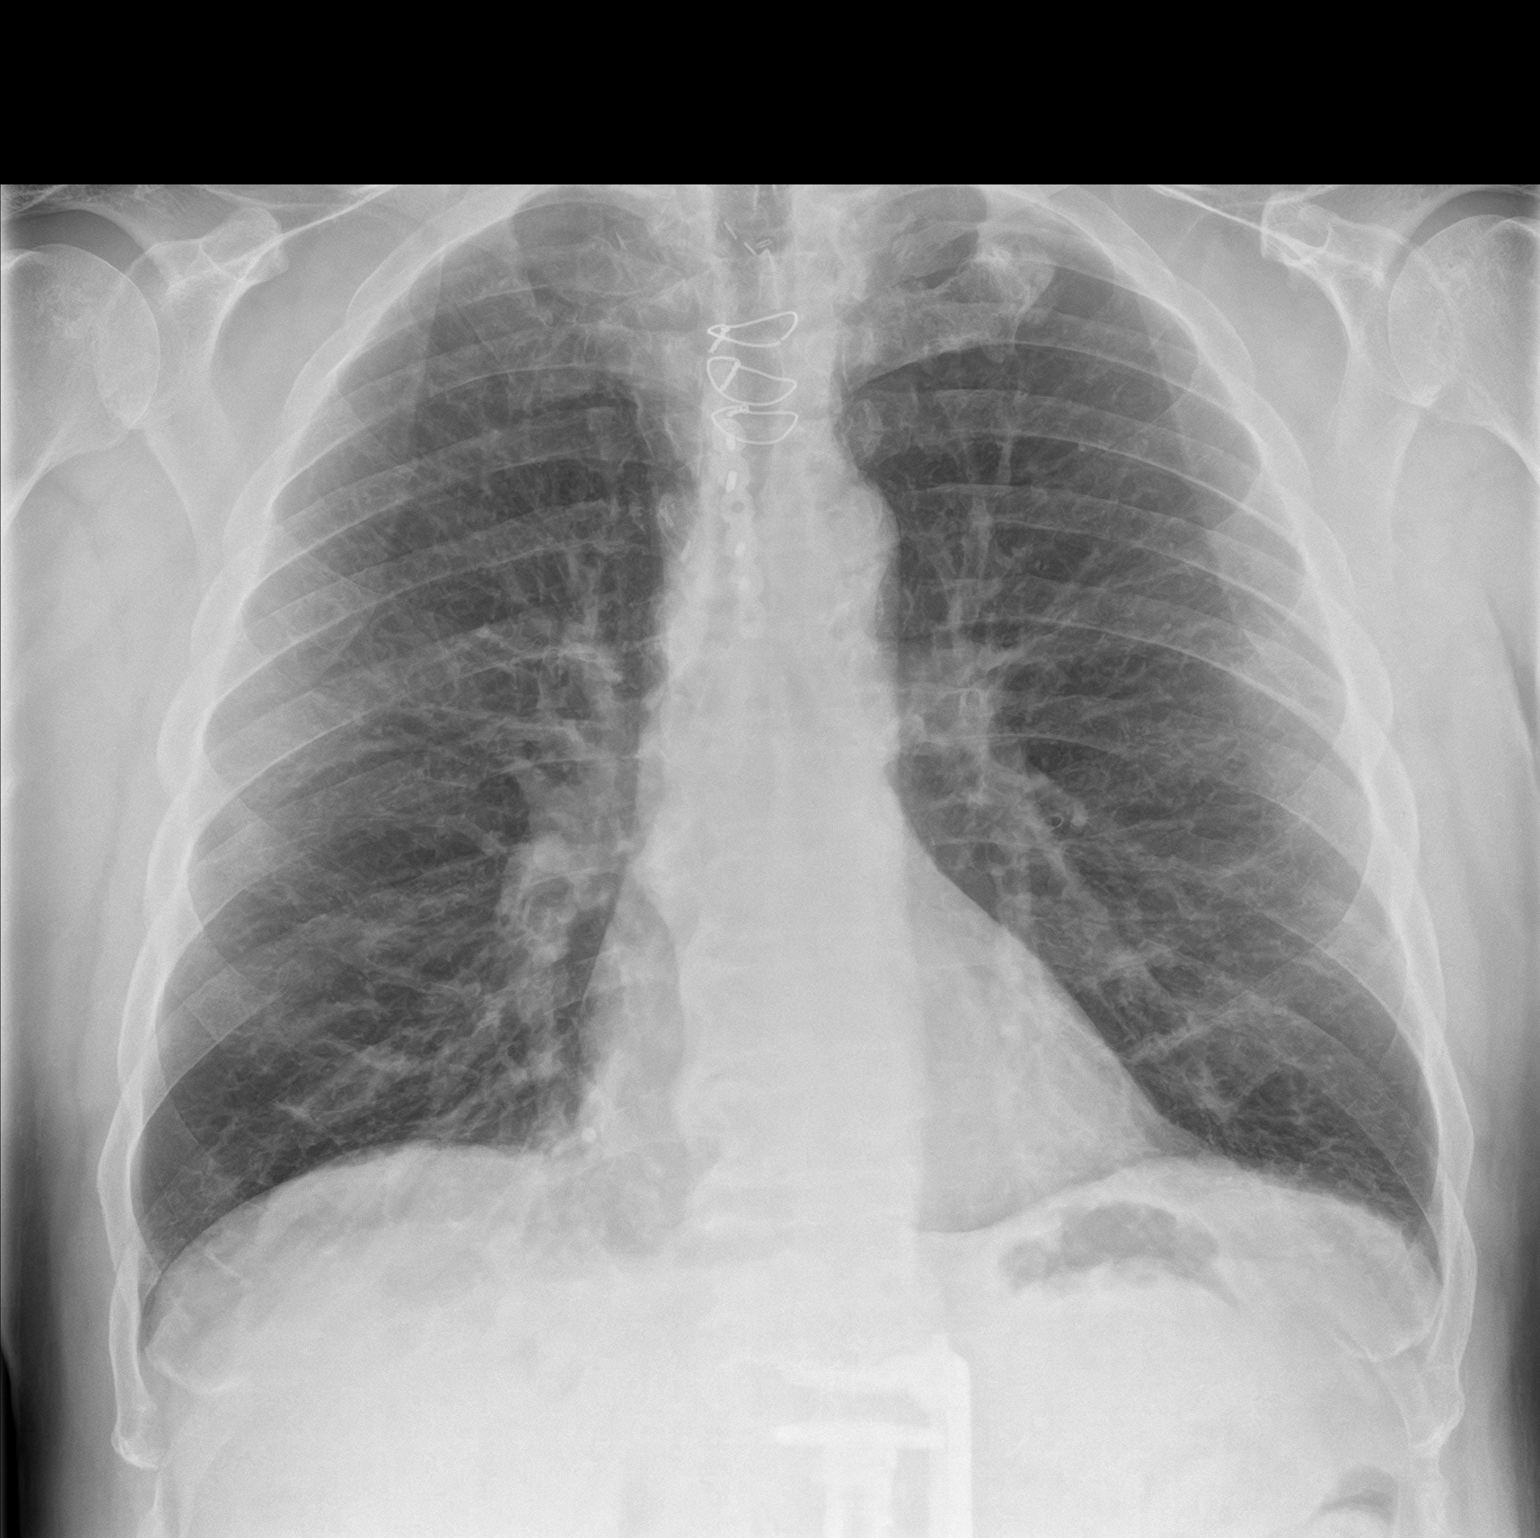
[im 2/2]
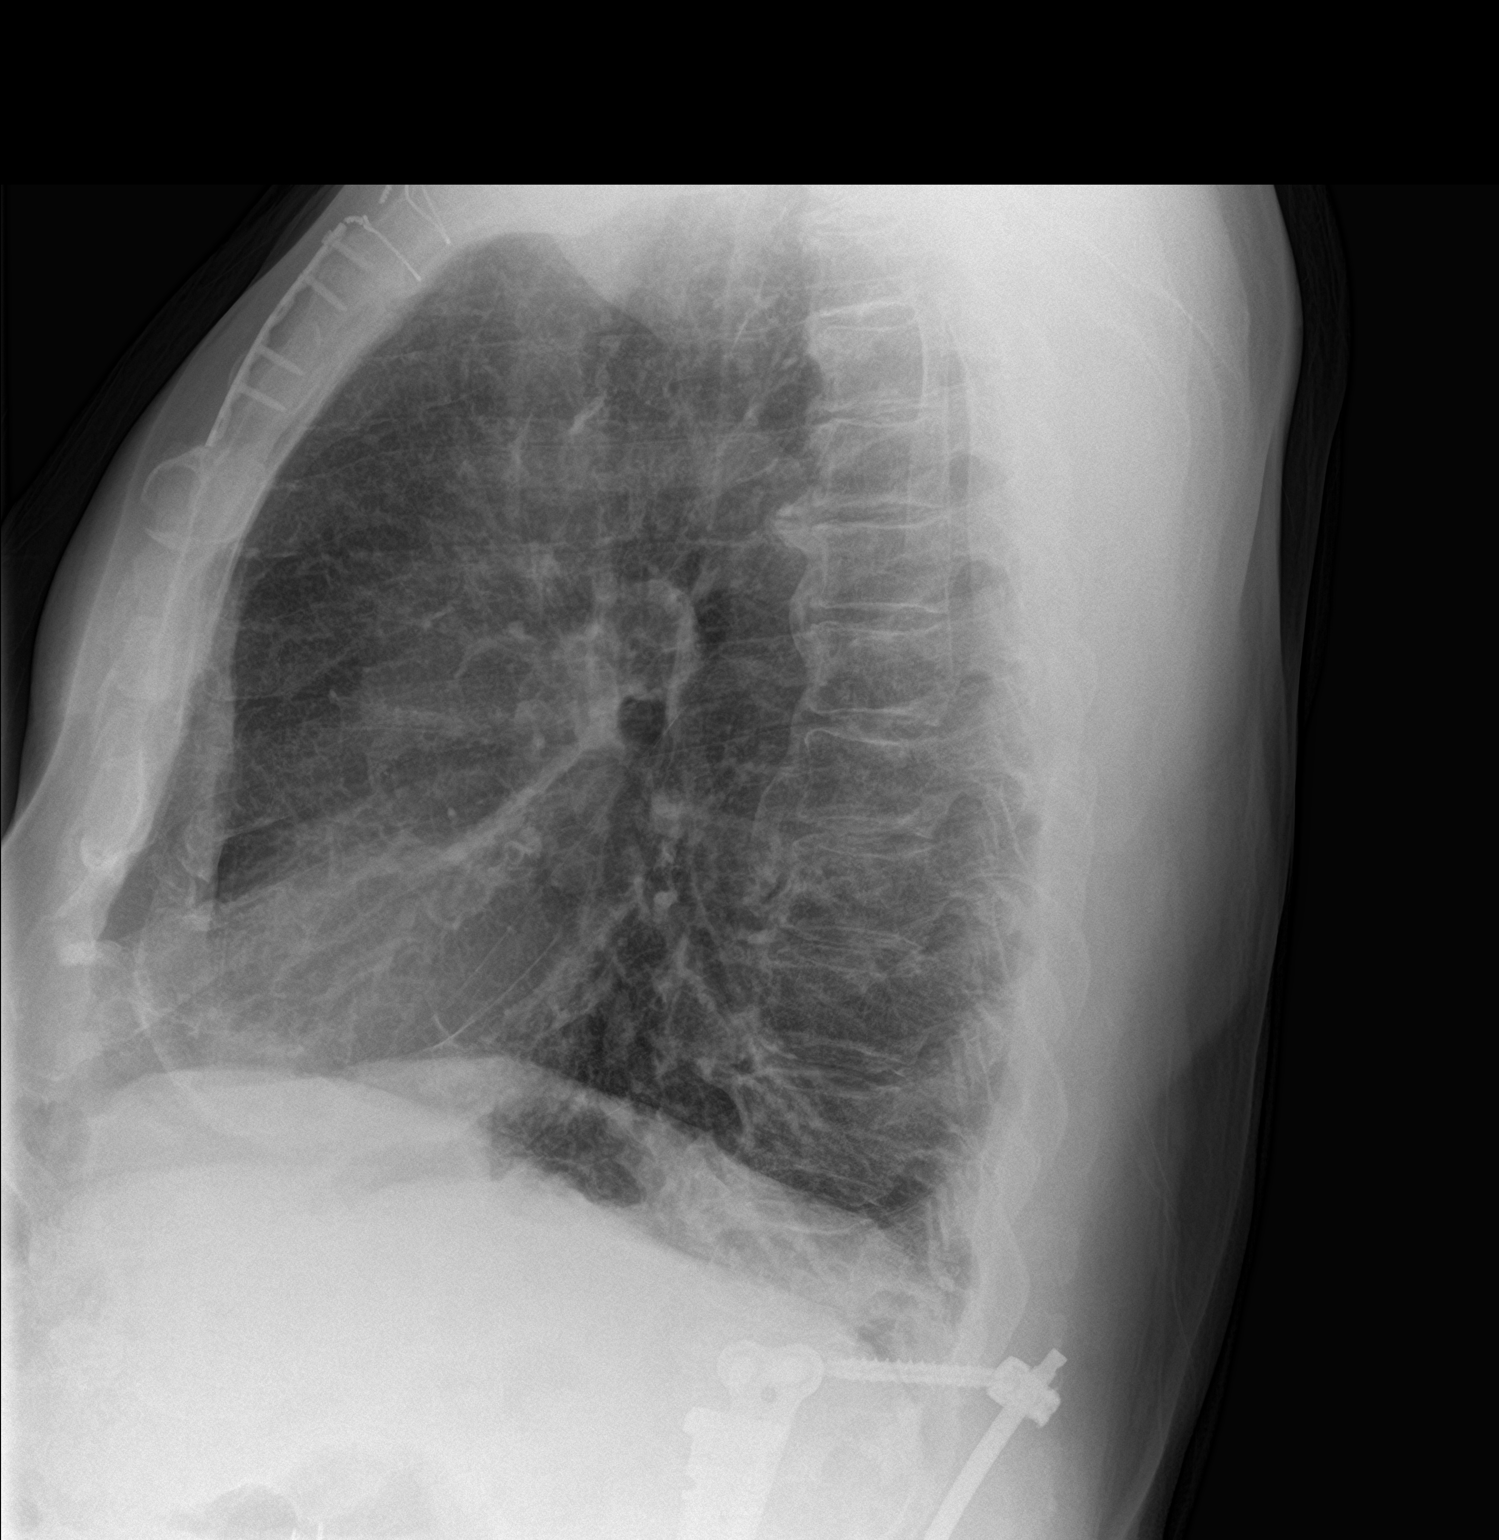

[2 of 2 positions shown; findings below may reference images not displayed]

FINDINGS: Postoperative changes within the sternum and mediastinum. The heart
size and mediastinal contours are within normal limits. No focal
airspace consolidation, pleural effusion, or pneumothorax. Partially
visualized postoperative changes within the lumbar spine.
IMPRESSION: No active cardiopulmonary disease.

## 2021-08-27 ENCOUNTER — Ambulatory Visit: Payer: Medicare HMO | Admitting: Pulmonary Disease

## 2021-09-03 ENCOUNTER — Encounter: Payer: Self-pay | Admitting: Pulmonary Disease

## 2021-09-18 ENCOUNTER — Ambulatory Visit: Payer: Medicare HMO | Admitting: Primary Care

## 2021-09-18 NOTE — Progress Notes (Deleted)
@Patient  ID: Bradley Sawyer, male    DOB: Nov 05, 1956, 65 y.o.   MRN: [id number]  No chief complaint on file.   Referring provider: Curly Rim, MD  HPI: 65 year old male, former smoker quit in January 2022 (96-pack-year history).  Past medical history significant for PD, chronic respiratory failure with hypoxia, coronary artery disease, hypertension, peripheral artery disease, unstable angina, CVA, hyperlipidemia.  Patient of Dr. Patsey Berthold, last seen in office on 07/30/2021.  09/18/2021 Patient presents today for 4 to 6-week follow-up.  During his last office visit in September he was treated for an acute COPD exacerbation/bronchitis with Z-Pak and prednisone taper.  He is maintained on Pulmicort, Brovana and Yupelri nebulizer's.  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   Compression fracture    L2   COPD (chronic obstructive pulmonary disease) (HCC)    bronchitis   CVA (cerebral infarction)    Disorder of vocal cord    Fatty liver    GERD (gastroesophageal reflux disease)    Hx of cardiovascular stress test    ETT/Lexiscan-Myoview (10/15):  Inferior, inferoapical, apical lateral defect c/w mild ischemia, EF 66%; Intermediate Risk   Hyperlipidemia    Hypertension    Right shoulder pain     2/2 partial rotator cuff tear, tendinopathy, mild subacromial/subdeltioid bursitis, A/C joint arthropathy per MRI done in 3/07   Tobacco abuse     Tobacco History: Social History   Tobacco Use  Smoking Status Former   Packs/day: 2.00   Years: 48.00   Pack years: 96.00   Types: Cigarettes   Quit date: 11/22/2020   Years since quitting: 0.8  Smokeless Tobacco Never   Counseling given: Not Answered   Outpatient Medications Prior to Visit  Medication Sig Dispense Refill   albuterol (PROVENTIL) (2.5 MG/3ML) 0.083% nebulizer solution USE 1 AMPULE IN NEBULIZER EVERY 6 HOURS AS NEEDED. 75 mL 0   amLODipine (NORVASC) 5 MG tablet Take 5 mg by mouth daily.     arformoterol (BROVANA) 15 MCG/2ML NEBU Take 2 mLs (15 mcg total) by nebulization 2 (two) times daily. 120 mL 11   aspirin EC 81 MG tablet Take 81 mg by mouth daily.     atorvastatin (LIPITOR) 80 MG tablet Take 80 mg by mouth daily.     budesonide (PULMICORT) 0.25 MG/2ML nebulizer solution Take 2 mLs (0.25 mg total) by nebulization 2 (two) times daily. 120 mL 11   clopidogrel (PLAVIX) 75 MG tablet Take 75 mg by mouth daily.     EPINEPHrine 0.3 mg/0.3 mL IJ SOAJ injection Inject 0.3 mg into the muscle as needed for anaphylaxis.     FLUoxetine (PROZAC) 20 MG capsule Take 20 mg by mouth daily.     isosorbide mononitrate (IMDUR) 30 MG 24 hr tablet Take 30 mg by mouth daily.     levocetirizine (XYZAL) 5 MG tablet Take 5 mg  by mouth at bedtime.     linaclotide (LINZESS) 290 MCG CAPS capsule Take 290 mcg by mouth daily before breakfast.     lisinopril-hydrochlorothiazide (ZESTORETIC) 20-25 MG tablet Take 1 tablet by mouth daily.     meloxicam (MOBIC) 15 MG tablet TAKE 1 TABLET IN THE MORNING WITH FOOD     metFORMIN (GLUCOPHAGE) 500 MG tablet Take 500 mg by mouth daily.     montelukast (SINGULAIR) 10 MG tablet Take 10 mg by mouth daily.     Multiple Vitamin (MULTI-VITAMINS) TABS Take 1 tablet by mouth daily.     naloxone (NARCAN) nasal  spray 4 mg/0.1 mL Place 0.4 mg into the nose once.     nitroGLYCERIN (NITROSTAT) 0.4 MG SL tablet Place 0.4 mg under the tongue every 5 (five) minutes as needed for chest pain.     Olopatadine HCl 0.2 % SOLN INSTILL 1 DROPS INTO EACH EYE ONCE DAILY AS NEEDED FOR ITCHY EYES.     pantoprazole (PROTONIX) 40 MG tablet Take 1 tablet by mouth daily.     polyethylene glycol (MIRALAX / GLYCOLAX) packet Take 17 g by mouth daily as needed (for constipation).     predniSONE (STERAPRED UNI-PAK 21 TAB) 10 MG (21) TBPK tablet Take as directed in the package 21 tablet 0   pregabalin (LYRICA) 150 MG capsule Take 150 mg by mouth daily.     PROAIR HFA 108 (90 BASE) MCG/ACT inhaler INHALE 2 PUFFS INTO THE LUNGS EVERY 6 (SIX) HOURS AS NEEDED FOR WHEEZING. (Patient taking differently: Inhale 2 puffs into the lungs every 6 (six) hours as needed for shortness of breath or wheezing.) 22.5 Inhaler 1   rOPINIRole (REQUIP) 1 MG tablet Take 1 tablet by mouth at bedtime.     spironolactone (ALDACTONE) 25 MG tablet Take 25 mg by mouth daily.     tamsulosin (FLOMAX) 0.4 MG CAPS capsule TAKE (1) CAPSULE BY MOUTH ONCE DAILY. (Patient taking differently: Take 0.4 mg by mouth daily.) 30 capsule 11   YUPELRI 175 MCG/3ML nebulizer solution Inhale into the lungs.     Facility-Administered Medications Prior to Visit  Medication Dose Route Frequency Provider Last Rate Last Admin   sodium chloride flush (NS) 0.9 % injection 3 mL  3 mL Intravenous Q12H Dionisio David, MD          Review of Systems  Review of Systems   Physical Exam  There were no vitals taken for this visit. Physical Exam   Lab Results:  CBC    Component Value Date/Time   WBC 8.3 07/30/2021 1700   RBC 3.73 (L) 07/30/2021 1700   HGB 11.9 (L) 07/30/2021 1700   HGB 15.1 07/01/2013 1441   HCT 34.4 (L) 07/30/2021 1700   HCT 42.6 07/01/2013 1441   PLT 179 07/30/2021 1700   PLT 209 07/01/2013 1441   MCV 92.2 07/30/2021 1700   MCV 90 07/01/2013 1441    MCH 31.9 07/30/2021 1700   MCHC 34.6 07/30/2021 1700   RDW 12.1 07/30/2021 1700   RDW 12.7 07/01/2013 1441   LYMPHSABS 2.8 07/30/2021 1700   MONOABS 0.8 07/30/2021 1700   EOSABS 0.5 07/30/2021 1700   BASOSABS 0.1 07/30/2021 1700    BMET    Component Value Date/Time   NA 136 02/28/2021 0437   NA 136 07/01/2013 1441   K 3.7 02/28/2021 0437   K 3.2 (L) 07/01/2013 1441   CL 100 02/28/2021 0437   CL 100 07/01/2013 1441   CO2 26 02/28/2021 0437  CO2 31 07/01/2013 1441   GLUCOSE 148 (H) 02/28/2021 0437   GLUCOSE 106 (H) 07/01/2013 1441   BUN 28 (H) 02/28/2021 0437   BUN 17 07/01/2013 1441   CREATININE 1.04 02/28/2021 0437   CREATININE 0.82 02/11/2015 1413   CALCIUM 9.5 02/28/2021 0437   CALCIUM 9.5 07/01/2013 1441   GFRNONAA >60 02/28/2021 0437   GFRNONAA >89 02/11/2015 1413   GFRAA >60 02/20/2017 0326   GFRAA >89 02/11/2015 1413    BNP    Component Value Date/Time   BNP 16.8 02/26/2021 2225    ProBNP No results found for: PROBNP  Imaging: No results found.   Assessment & Plan:   No problem-specific Assessment & Plan notes found for this encounter.     Martyn Ehrich, NP 09/18/2021

## 2021-09-20 ENCOUNTER — Emergency Department: Payer: Medicare HMO

## 2021-09-20 ENCOUNTER — Encounter: Payer: Self-pay | Admitting: Emergency Medicine

## 2021-09-20 ENCOUNTER — Other Ambulatory Visit: Payer: Self-pay

## 2021-09-20 ENCOUNTER — Ambulatory Visit (INDEPENDENT_AMBULATORY_CARE_PROVIDER_SITE_OTHER)
Admission: EM | Admit: 2021-09-20 | Discharge: 2021-09-20 | Disposition: A | Discharge status: Home / Self Care | Payer: Medicare HMO | Source: Home / Self Care

## 2021-09-20 ENCOUNTER — Emergency Department
Admission: EM | Admit: 2021-09-20 | Discharge: 2021-09-20 | Disposition: A | Discharge status: Home / Self Care | Payer: Medicare HMO | Attending: Emergency Medicine | Admitting: Emergency Medicine

## 2021-09-20 ENCOUNTER — Ambulatory Visit (INDEPENDENT_AMBULATORY_CARE_PROVIDER_SITE_OTHER): Payer: Medicare HMO

## 2021-09-20 DIAGNOSIS — R300 Dysuria: Secondary | ICD-10-CM | POA: Insufficient documentation

## 2021-09-20 DIAGNOSIS — E876 Hypokalemia: Secondary | ICD-10-CM

## 2021-09-20 DIAGNOSIS — Z5321 Procedure and treatment not carried out due to patient leaving prior to being seen by health care provider: Secondary | ICD-10-CM | POA: Insufficient documentation

## 2021-09-20 DIAGNOSIS — R109 Unspecified abdominal pain: Secondary | ICD-10-CM | POA: Diagnosis not present

## 2021-09-20 DIAGNOSIS — R509 Fever, unspecified: Secondary | ICD-10-CM | POA: Diagnosis not present

## 2021-09-20 DIAGNOSIS — R9431 Abnormal electrocardiogram [ECG] [EKG]: Secondary | ICD-10-CM | POA: Insufficient documentation

## 2021-09-20 DIAGNOSIS — R1900 Intra-abdominal and pelvic swelling, mass and lump, unspecified site: Secondary | ICD-10-CM | POA: Insufficient documentation

## 2021-09-20 DIAGNOSIS — W19XXXA Unspecified fall, initial encounter: Secondary | ICD-10-CM | POA: Diagnosis not present

## 2021-09-20 DIAGNOSIS — R410 Disorientation, unspecified: Secondary | ICD-10-CM

## 2021-09-20 DIAGNOSIS — N3001 Acute cystitis with hematuria: Secondary | ICD-10-CM

## 2021-09-20 DIAGNOSIS — M549 Dorsalgia, unspecified: Secondary | ICD-10-CM

## 2021-09-20 DIAGNOSIS — R531 Weakness: Secondary | ICD-10-CM | POA: Insufficient documentation

## 2021-09-20 DIAGNOSIS — N390 Urinary tract infection, site not specified: Secondary | ICD-10-CM | POA: Diagnosis not present

## 2021-09-20 LAB — CBC WITH DIFFERENTIAL/PLATELET
Abs Immature Granulocytes: 0.06 10*3/uL (ref 0.00–0.07)
Basophils Absolute: 0.1 10*3/uL (ref 0.0–0.1)
Basophils Relative: 1 %
Eosinophils Absolute: 0.1 10*3/uL (ref 0.0–0.5)
Eosinophils Relative: 1 %
HCT: 34.8 % — ABNORMAL LOW (ref 39.0–52.0)
Hemoglobin: 11.4 g/dL — ABNORMAL LOW (ref 13.0–17.0)
Immature Granulocytes: 1 %
Lymphocytes Relative: 19 %
Lymphs Abs: 2.1 10*3/uL (ref 0.7–4.0)
MCH: 30 pg (ref 26.0–34.0)
MCHC: 32.8 g/dL (ref 30.0–36.0)
MCV: 91.6 fL (ref 80.0–100.0)
Monocytes Absolute: 0.9 10*3/uL (ref 0.1–1.0)
Monocytes Relative: 8 %
Neutro Abs: 7.5 10*3/uL (ref 1.7–7.7)
Neutrophils Relative %: 70 %
Platelets: 221 10*3/uL (ref 150–400)
RBC: 3.8 MIL/uL — ABNORMAL LOW (ref 4.22–5.81)
RDW: 13 % (ref 11.5–15.5)
WBC: 10.6 10*3/uL — ABNORMAL HIGH (ref 4.0–10.5)
nRBC: 0 % (ref 0.0–0.2)

## 2021-09-20 LAB — URINALYSIS, COMPLETE (UACMP) WITH MICROSCOPIC
Glucose, UA: NEGATIVE mg/dL
Nitrite: POSITIVE — AB
Protein, ur: 30 mg/dL — AB
Specific Gravity, Urine: 1.02 (ref 1.005–1.030)
WBC, UA: >50 WBC/hpf (ref 0–5)
pH: 5.5 (ref 5.0–8.0)

## 2021-09-20 LAB — COMPREHENSIVE METABOLIC PANEL
ALT: 36 U/L (ref 0–44)
AST: 36 U/L (ref 15–41)
Albumin: 3.7 g/dL (ref 3.5–5.0)
Alkaline Phosphatase: 98 U/L (ref 38–126)
Anion gap: 9 (ref 5–15)
BUN: 18 mg/dL (ref 8–23)
CO2: 31 mmol/L (ref 22–32)
Calcium: 9.2 mg/dL (ref 8.9–10.3)
Chloride: 98 mmol/L (ref 98–111)
Creatinine, Ser: 1.02 mg/dL (ref 0.61–1.24)
GFR, Estimated: >60 mL/min (ref >60)
Glucose, Bld: 143 mg/dL — ABNORMAL HIGH (ref 70–99)
Potassium: 2.9 mmol/L — ABNORMAL LOW (ref 3.5–5.1)
Sodium: 138 mmol/L (ref 135–145)
Total Bilirubin: 0.5 mg/dL (ref 0.3–1.2)
Total Protein: 8.2 g/dL — ABNORMAL HIGH (ref 6.5–8.1)

## 2021-09-20 LAB — LACTIC ACID, PLASMA: Lactic Acid, Venous: 1.9 mmol/L (ref 0.5–1.9)

## 2021-09-20 NOTE — ED Triage Notes (Signed)
Family reports they are taking the patient home.

## 2021-09-20 NOTE — Discharge Instructions (Addendum)
-  Bradley Sawyer has a UTI. -The potassium is low and there are changes on EKG.  It is suggested that with these changes he be seen in the emergency department and have IV potassium replacement and monitoring.  I am also concerned about the swelling of the left flank.  He may need a CT for that.  You have been advised to follow up immediately in the emergency department for concerning signs.symptoms. If you declined EMS transport, please have a family member take you directly to the ED at this time. Do not delay. Based on concerns about condition, if you do not follow up in th e ED, you may risk poor outcomes including worsening of condition, delayed treatment and potentially life threatening issues. If you have declined to go to the ED at this time, you should call your PCP immediately to set up a follow up appointment.  Go to ED for red flag symptoms, including; fevers you cannot reduce with Tylenol/Motrin, severe headaches, vision changes, numbness/weakness in part of the body, lethargy, confusion, intractable vomiting, severe dehydration, chest pain, breathing difficulty, severe persistent abdominal or pelvic pain, signs of severe infection (increased redness, swelling of an area), feeling faint or passing out, dizziness, etc. You should especially go to the ED for sudden acute worsening of condition if you do not elect to go at this time.

## 2021-09-20 NOTE — ED Notes (Signed)
Pt family reports they are leaving. Pt encouraged to stay. RN apologetic about wait. Pt does not wish to stay

## 2021-09-20 NOTE — ED Triage Notes (Signed)
Pt presented to UC today for weakness. UC reported k+ was low and he hdd EKG changes and to come to the ED.

## 2021-09-20 NOTE — ED Triage Notes (Signed)
Patient's son states that his father had altered mental status on Friday.  States that he had urinated on the floor and has been having pain with urination.

## 2021-09-20 NOTE — ED Provider Notes (Signed)
Emergency Medicine Provider Triage Evaluation Note  Bradley Sawyer , a 65 y.o. male  was evaluated in triage.  Patient has a history of COPD and 24-hour continuous supplemental oxygen, prior CVA and hypertension.  Patient has had fever for the past 2 to 3 days at home and has had altered mental status.  Patient seems confused according to daughter-in-law which is atypical for him.  He states that he feels generally weak and has had some burning with urination.  He states that he has some mild left-sided low back pain.  Denies current chest pain, chest tightness or new shortness of breath.  No new supplemental oxygen demands. Review of Systems  Positive: Patient has fever, dysuria and left flank pain.  Negative: No chest pain, chest tightness or SOB.   Physical Exam  There were no vitals taken for this visit. Gen:   Awake, no distress   Resp:  Normal effort  MSK:   Moves extremities without difficulty  Other:    Medical Decision Making  Medically screening exam initiated at 3:42 PM.  Appropriate orders placed.  CHAMBERLAIN STEINBORN was informed that the remainder of the evaluation will be completed by another provider, this initial triage assessment does not replace that evaluation, and the importance of remaining in the ED until their evaluation is complete.  Basic labs from urgent care reviewed.  Patient has mild leukocytosis.  Given fever and altered mental status, will obtain lactic and blood cultures and CT renal stone study and will reassess.   Vallarie [place], PA-C 09/20/21 1544    Merlyn Lot, MD 09/20/21 [phone]

## 2021-09-20 NOTE — ED Provider Notes (Signed)
[facility]    CSN: [id number] Arrival date & time: 09/20/21  1217      History   Chief Complaint Chief Complaint  Patient presents with   Altered Mental Status    [name] is a 65 y.o. male with significant medical history for severe COPD on 24-hour continuous supplemental oxygen, CVA, hypertension and hyperlipidemia.  Today, patient is presenting with his son and daughter-in-law for concerns about possible UTI.  They state that over the past couple of days he has had a few episodes where he seems to be confused.  They state that he urinated on the floor in the house when he could not get to the bathroom.  He has had some urgency and possibly frequency of urination.  Patient does not report any burning pain.  He denies any abdominal pain.  He did have a bilateral inguinal hernia repair 1 month ago.  They do report noticing swelling of the left side of his back over the past couple of days.  They are unsure if he has had a fall but he does not have any bruising in the area.  Patient does not remember any falls.  Patient and family do not report any headaches, vision changes, facial drooping, falls, extremity numbness or weakness, chest pain, increased breathing difficulty, abdominal pain, back pain, abnormal bruising, hematuria. No other concerns at this time.  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   Compression fracture    L2   COPD (chronic obstructive pulmonary disease) (HCC)    bronchitis   CVA (cerebral infarction)    Disorder of vocal cord    Fatty liver    GERD (gastroesophageal reflux disease)    Hx of cardiovascular stress test    ETT/Lexiscan-Myoview (10/15):  Inferior, inferoapical, apical lateral defect c/w mild ischemia, EF 66%; Intermediate Risk   Hyperlipidemia    Hypertension     Right shoulder pain    2/2 partial rotator cuff tear, tendinopathy, mild subacromial/subdeltioid bursitis, A/C joint arthropathy per MRI done in 3/07   Tobacco abuse     Patient Active Problem List   Diagnosis Date Noted   Chronic respiratory failure with hypoxia ([place]) 03/30/2021   COPD exacerbation ([place]) 02/27/2021   History of CVA (cerebrovascular accident) 02/27/2021   Chronic pain, radicular 02/27/2021   Chronic, continuous use of opioids 02/27/2021   Unstable angina ([place]) 02/19/2021   Cigarette nicotine dependence without complication 41/66/0630   Acute lower UTI 02/19/2017   UTI (urinary tract infection) 02/19/2017   Left inguinal hernia 08/12/2016   Elevated troponin 05/08/2016   Anal fissure 03/15/2016   External hemorrhoid 03/15/2016   Acute viral pharyngitis 03/20/2015   Olecranon bursitis of left elbow 16/11/930   Embolic stroke ([place]) 35/57/3220   Carotid stenosis 10/15/2014   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/19/2014   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   PAD (peripheral artery disease) ([place]) 08/23/2014   Preventative health care 08/12/2014   Occlusion and stenosis of carotid artery without mention of cerebral infarction 07/25/2014   Subclavian steal syndrome 07/25/2014   CAD (coronary artery disease) 07/10/2014   Nocturia 04/17/2014   Testicular pain, right 04/17/2014   Urethral stricture 04/17/2014   CVA (cerebral infarction) 04/10/2014   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  Hematuria 08/06/2013   Compression fracture of L2, with back and right groin pain 07/18/2013   History of colon polyps 05/18/2012   COPD (chronic obstructive pulmonary disease) ([place]) 12/24/2009   Hyperlipemia 09/20/2006   TOBACCO ABUSE 09/20/2006   Essential hypertension 09/20/2006   GERD 09/20/2006    Past Surgical History:  Procedure Laterality Date   ANTERIOR LAT LUMBAR FUSION N/A 12/18/2013   Procedure: Lumbar Two Anteriorlateral  Corpectomy w/ cage, interbody fusion, anterior plating, Lumbar One to Lumbar Three percutaneous pedicle screws;  Surgeon: Charlie Pitter, MD;  Location: [facility];  Service: Neurosurgery;  Laterality: N/A;   AORTA -INNOMIATE BYPASS N/A 10/15/2014   Procedure: ENDARTERCTOMY OF  INNOMINATE ARTERY ;  Surgeon: Elam Dutch, MD;  Location: [facility];  Service: Vascular;  Laterality: N/A;   ARCH AORTOGRAM N/A 08/23/2014   Procedure: ARCH AORTOGRAM;  Surgeon: Elam Dutch, MD;  Location: [facility] CATH LAB;  Service: Cardiovascular;  Laterality: N/A;   arch aortogram, left carotid,left subclavian angiogram  08/23/2014   CAROTID ANGIOGRAM Left 08/23/2014   Procedure: CAROTID ANGIOGRAM;  Surgeon: Elam Dutch, MD;  Location: [facility] CATH LAB;  Service: Cardiovascular;  Laterality: Left;   CHOLECYSTECTOMY  12/2016   ENDARTERECTOMY Right 10/15/2014   Procedure: ENDARTERECTOMY RIGHT SUBCLAVIAN ARTERY;  Surgeon: Elam Dutch, MD;  Location: [facility];  Service: Vascular;  Laterality: Right;   ENDARTERECTOMY Right 10/15/2014   Procedure: ENDARTERECTOMY RIGHT COMMON CAROTID ARTERY;  Surgeon: Elam Dutch, MD;  Location: [place];  Service: Vascular;  Laterality: Right;   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   HERNIA REPAIR     LEFT HEART CATH AND CORONARY ANGIOGRAPHY N/A 03/17/2021   Procedure: LEFT HEART CATH AND CORONARY ANGIOGRAPHY;  Surgeon: Dionisio David, MD;  Location: [facility];  Service: Cardiovascular;  Laterality: N/A;   LEFT HEART CATHETERIZATION WITH CORONARY ANGIOGRAM N/A 09/12/2014   Procedure: LEFT HEART CATHETERIZATION WITH CORONARY ANGIOGRAM;  Surgeon: Josue Hector, MD;  Location: [facility] CATH LAB;  Service: Cardiovascular;  Laterality: N/A;   LUMBAR SPINE SURGERY  12/18/2013   L2     DR POOL   NECK SURGERY     PATCH ANGIOPLASTY Right 10/15/2014   Procedure: PATCH ANGIOPLASTY OF RIGHT SUBCLAVIAN ARTERY , RIGHT COMMON CAROTID ARTERY & INNOMINATE ARTERY;  Surgeon: Elam Dutch, MD;  Location: [place];  Service: Vascular;  Laterality: Right;   STERNOTOMY N/A 10/15/2014   Procedure: PARTIAL STERNOTOMY & PLATING OF STERNUM;  Surgeon: Elam Dutch, MD;  Location: [place];  Service: Vascular;  Laterality: N/A;   TEE WITHOUT CARDIOVERSION N/A 12/25/2013   Procedure: TRANSESOPHAGEAL ECHOCARDIOGRAM (TEE);  Surgeon: Larey Dresser, MD;  Location: [place];  Service: Cardiovascular;  Laterality: N/A;       Home Medications    Prior to Admission medications   Medication Sig Start Date End Date Taking? Authorizing Provider  albuterol (PROVENTIL) (2.5 MG/3ML) 0.083% nebulizer solution USE 1 AMPULE IN NEBULIZER EVERY 6 HOURS AS NEEDED. 06/29/21   Tyler Pita, MD  amLODipine (NORVASC) 5 MG tablet Take 5 mg by mouth daily.    [provider]  arformoterol (BROVANA) 15 MCG/2ML NEBU Take 2 mLs (15 mcg total) by nebulization 2 (two) times daily. 06/11/21 06/06/22  Tyler Pita, MD  aspirin EC 81 MG tablet Take 81 mg by mouth daily.    [provider]  atorvastatin (LIPITOR) 80 MG tablet Take 80 mg by mouth daily. 05/17/20  [provider]  budesonide (PULMICORT) 0.25 MG/2ML nebulizer solution Take 2 mLs (0.25 mg total) by nebulization 2 (two) times daily. 03/30/21   Tyler Pita, MD  clopidogrel (PLAVIX) 75 MG tablet Take 75 mg by mouth daily. 02/14/21   [provider]  EPINEPHrine 0.3 mg/0.3 mL IJ SOAJ injection Inject 0.3 mg into the muscle as needed for anaphylaxis. 04/04/20   [provider]  FLUoxetine (PROZAC) 20 MG capsule Take 20 mg by mouth daily. 06/01/21   [provider]  isosorbide mononitrate (IMDUR) 30 MG 24 hr tablet Take 30 mg by mouth daily.    [provider]  levocetirizine (XYZAL) 5 MG tablet Take 5 mg by mouth at bedtime. 07/15/20   [provider]  linaclotide Rolan Lipa) 290 MCG CAPS capsule Take 290 mcg by mouth daily before breakfast. 12/05/19   [provider]  lisinopril-hydrochlorothiazide (ZESTORETIC) 20-25 MG tablet Take 1 tablet by mouth daily. 06/01/21   [provider]  meloxicam (MOBIC) 15 MG tablet TAKE 1 TABLET IN THE MORNING WITH FOOD 05/26/21   [provider]  metFORMIN (GLUCOPHAGE) 500 MG tablet Take 500 mg by mouth daily. 07/22/20   [provider]  montelukast (SINGULAIR) 10 MG tablet Take 10 mg by mouth daily. 07/15/20   [provider]  Multiple Vitamin (MULTI-VITAMINS) TABS Take 1 tablet by mouth daily.    [provider]  naloxone [facility]) nasal spray 4 mg/0.1 mL Place 0.4 mg into the nose once. 09/02/16   [provider]  nitroGLYCERIN (NITROSTAT) 0.4 MG SL tablet Place 0.4 mg under the tongue every 5 (five) minutes as needed for chest pain. 12/12/20   [provider]  Olopatadine HCl 0.2 % SOLN INSTILL 1 DROPS INTO EACH EYE ONCE DAILY AS NEEDED FOR ITCHY EYES. 05/05/21   [provider]  pantoprazole (PROTONIX) 40 MG tablet Take 1 tablet by mouth daily. 06/01/21   [provider]  polyethylene glycol (MIRALAX / GLYCOLAX) packet Take 17 g by mouth daily as needed (for constipation).    [provider]  predniSONE (STERAPRED UNI-PAK 21 TAB) 10 MG (21) TBPK tablet Take as directed in the package 07/30/21   Tyler Pita, MD  pregabalin (LYRICA) 150 MG capsule Take 150 mg by mouth daily. 09/20/19   [provider]  PROAIR HFA 108 (90 BASE) MCG/ACT inhaler INHALE 2 PUFFS INTO THE LUNGS EVERY 6 (SIX) HOURS AS NEEDED FOR WHEEZING. Patient taking differently: Inhale 2 puffs into the lungs every 6 (six) hours as needed for shortness of breath or wheezing. 05/01/15   Rivet, Sindy Guadeloupe, MD  rOPINIRole (REQUIP) 1 MG tablet Take 1 tablet by mouth at bedtime. 06/01/21   [provider]  spironolactone (ALDACTONE) 25 MG tablet Take 25 mg by mouth daily. 05/26/21   [provider]  tamsulosin (FLOMAX) 0.4 MG CAPS capsule TAKE (1) CAPSULE BY  MOUTH ONCE DAILY. Patient taking differently: Take 0.4 mg by mouth daily. 11/07/20   McGowan, Shannon A, PA-C  YUPELRI 175 MCG/3ML nebulizer solution Inhale into the lungs. 06/01/21   [provider]    Family History Family History  Problem Relation Age of Onset   Heart disease Father    Heart attack Father    Diabetes Paternal Grandmother        1 st degree relatives   Hypertension Paternal Grandmother    Diabetes Brother    Heart disease Brother    Muscular dystrophy Brother    Muscular  dystrophy Sister    Prostate cancer Neg Hx    Kidney cancer Neg Hx    Bladder Cancer Neg Hx     Social History Social History   Tobacco Use   Smoking status: Former    Packs/day: 2.00    Years: 48.00    Pack years: 96.00    Types: Cigarettes    Quit date: 11/22/2020    Years since quitting: 0.8   Smokeless tobacco: Never  Substance Use Topics   Alcohol use: No    Alcohol/week: 0.0 standard drinks   Drug use: No    Types: Cocaine, Marijuana    Comment: no recent use     Allergies   Bee venom   Review of Systems Review of Systems  Constitutional:  Negative for fatigue and fever.  HENT:  Negative for congestion.   Respiratory:  Negative for cough and shortness of breath.   Cardiovascular:  Negative for chest pain.  Gastrointestinal:  Negative for abdominal pain, nausea and vomiting.       Swelling of left flank, lower left posterior ribs  Genitourinary:  Positive for frequency and urgency. Negative for difficulty urinating, dysuria and hematuria.  Musculoskeletal:  Negative for back pain, gait problem and joint swelling.  Neurological:  Negative for dizziness, syncope, facial asymmetry, speech difficulty, weakness and headaches.  Psychiatric/Behavioral:  Positive for confusion.     Physical Exam Triage Vital Signs ED Triage Vitals  Enc Vitals Group     BP 09/20/21 1237 103/67     Pulse Rate 09/20/21 1237 65     Resp 09/20/21 1237 16     Temp 09/20/21 1237 98.3  F (36.8 C)     Temp Source 09/20/21 1237 Oral     SpO2 09/20/21 1237 96 %     Weight 09/20/21 1234 229 lb 15 oz (104.3 kg)     Height 09/20/21 1234 5\' 11"  (1.803 m)     Head Circumference --      Peak Flow --      Pain Score 09/20/21 1234 0     Pain Loc --      Pain Edu? --      Excl. in [place]? --    No data found.  Updated Vital Signs BP 103/67 (BP Location: Left Arm)   Pulse 65   Temp 98.3 F (36.8 C) (Oral)   Resp 16   Ht 5\' 11"  (1.803 m)   Wt 229 lb 15 oz (104.3 kg)   SpO2 96%   BMI 32.07 kg/m      Patient is on 3L O2  Physical Exam Vitals and nursing note reviewed.  Constitutional:      General: He is not in acute distress.    Appearance: Normal appearance. He is well-developed. He is ill-appearing (mildly).  HENT:     Head: Normocephalic and atraumatic.     Nose: Nose normal.     Mouth/Throat:     Mouth: Mucous membranes are moist.     Pharynx: Oropharynx is clear.  Eyes:     General: No scleral icterus.    Conjunctiva/sclera: Conjunctivae normal.     Pupils: Pupils are equal, round, and reactive to light.  Cardiovascular:     Rate and Rhythm: Regular rhythm. Bradycardia present.     Heart sounds: Normal heart sounds.  Pulmonary:     Effort: Pulmonary effort is normal. No respiratory distress.     Breath sounds: Wheezing (few minor scattered wheezes) present.  Abdominal:  General: There is distension (mild/moderate distention left flank. No TTP).     Palpations: Abdomen is soft.     Tenderness: There is no abdominal tenderness. There is no right CVA tenderness, left CVA tenderness, guarding or rebound.  Musculoskeletal:     Cervical back: Neck supple.     Comments: Mild/moderate swelling left lower posterior ribs. No TTP  Skin:    General: Skin is warm and dry.  Neurological:     General: No focal deficit present.     Mental Status: He is alert and oriented to person, place, and time. Mental status is at baseline.     Motor: No weakness.      Coordination: Coordination normal.     Gait: Gait normal.  Psychiatric:        Mood and Affect: Mood normal.        Behavior: Behavior normal.        Thought Content: Thought content normal.     UC Treatments / Results  Labs (all labs ordered are listed, but only abnormal results are displayed) Labs Reviewed  URINALYSIS, COMPLETE (UACMP) WITH MICROSCOPIC - Abnormal; Notable for the following components:      Result Value   APPearance HAZY (*)    Hgb urine dipstick MODERATE (*)    Bilirubin Urine SMALL (*)    Ketones, ur TRACE (*)    Protein, ur 30 (*)    Nitrite POSITIVE (*)    Leukocytes,Ua MODERATE (*)    Bacteria, UA MANY (*)    All other components within normal limits  COMPREHENSIVE METABOLIC PANEL - Abnormal; Notable for the following components:   Potassium 2.9 (*)    Glucose, Bld 143 (*)    Total Protein 8.2 (*)    All other components within normal limits  CBC WITH DIFFERENTIAL/PLATELET - Abnormal; Notable for the following components:   WBC 10.6 (*)    RBC 3.80 (*)    Hemoglobin 11.4 (*)    HCT 34.8 (*)    All other components within normal limits  URINE CULTURE    EKG   Radiology DG Ribs Unilateral W/Chest Left  Result Date: 09/20/2021 CLINICAL DATA:  Left back pain after fall. EXAM: LEFT RIBS AND CHEST - 3+ VIEW COMPARISON:  February 26, 2021. FINDINGS: No fracture or other bone lesions are seen involving the ribs. There is no evidence of pneumothorax or pleural effusion. Both lungs are clear. Heart size and mediastinal contours are within normal limits. IMPRESSION: Negative. Electronically Signed   By: Marijo Conception M.D.   On: 09/20/2021 14:23   DG Abdomen 1 View  Result Date: 09/20/2021 CLINICAL DATA:  [place].  Back and flank pain. EXAM: ABDOMEN - 1 VIEW COMPARISON:  01/20/2021 FINDINGS: The lung bases are clear. Scattered air and stool in the colon but no findings for small bowel obstruction or free air. The soft tissue shadows are grossly maintained. No  worrisome calcifications. The bony structures are intact. Stable upper lumbar fusion hardware. IMPRESSION: No acute abdominal findings. Electronically Signed   By: Marijo Sanes M.D.   On: 09/20/2021 14:24    Procedures ED EKG  Date/Time: 09/20/2021 3:26 PM Performed by: Danton Clap, PA-C Authorized by: Danton Clap, PA-C   ECG reviewed by ED Physician in the absence of a cardiologist: yes   Previous ECG:    Previous ECG:  Compared to current   Similarity:  Changes noted   Comparison ECG info:  QT prolongation. PVCs Interpretation:  Interpretation: abnormal   Rate:    ECG rate:  66   ECG rate assessment: normal   Rhythm:    Rhythm: sinus bradycardia   Ectopy:    Ectopy: PVCs   QRS:    QRS axis:  Normal   QRS intervals:  Normal   QRS conduction: normal   ST segments:    ST segments:  Normal Other findings:    Other findings: prolonged qTc interval   Comments:     Sinus bradycardia with sinus arrhythmia, PVCs, prolonged QT (including critical care time)  Medications Ordered in UC Medications - No data to display  Initial Impression / Assessment and Plan / UC Course  I have reviewed the triage vital signs and the nursing notes.  Pertinent labs & imaging results that were available during my care of the patient were reviewed by me and considered in my medical decision making (see chart for details).  65 year old male presenting with his family for concerns about confusion over the past couple of days.  They report that he is forgotten what he has been doing multiple times over the past couple of days and has urinated on the floor in the house.  He has had some urinary urgency and frequency but when I ask him today he denies any associated pain.  I also noticed swelling of the left flank and left lower ribs but have not witnessed any falls.  On exam today he is mildly ill-appearing.  He is cooperative with exam and is alert and oriented.  He does not recall any of the  episodes that his family is talking about.  Normal cranial nerve exam.  Chest with a few scattered wheezes and heart rate is bradycardic.  He does have swelling of the left flank and left lower ribs but does not appear to have any tenderness.  UA today shows hazy urine, moderate hemoglobin, small bili, ketones, protein, positive nitrites, and moderate leukocytes.  We will send urine for culture but this definitely appears to be consistent with a UTI.  Given the swelling in the flank region, obtaining x-rays to include KUB and left ribs and chest.  X-rays independently reviewed by me.  He does have moderate stool burden in the colon but no evidence of fractures or other acute findings.  CBC shows slightly elevated WBCs at 10.6 and slightly decreased hemoglobin at 11.4.  CMP shows glucose elevated at 143 and potassium decreased to 2.9.  Last potassium was done about 6 weeks ago and it was 3.3.  Will obtain EKG to see if there he has any QT prolongation and if so sent to ED for IV potassium.  I am also concerned that he may need a CT of his abdomen and pelvis due to this swelling.  EKG performed today does show sinus bradycardia with arrhythmia, PVCs and prolonged QT.  Discussed results with family.  Advised that he needs evaluation in emergency department.  They agree and plan to take him to [facility].  Patient going via private vehicle since they did not want him to go by EMS.  He is leaving NSAID in addition.  Final Clinical Impressions(s) / UC Diagnoses   Final diagnoses:  Confusion  Hypokalemia  QT prolongation  Acute cystitis with hematuria  Abdominal swelling     Discharge Instructions      -[place] has a UTI. -The potassium is low and there are changes on EKG.  It is suggested that with these changes he be seen  in the emergency department and have IV potassium replacement and monitoring.  I am also concerned about the swelling of the left flank.  He may need a CT for that.  You  have been advised to follow up immediately in the emergency department for concerning signs.symptoms. If you declined EMS transport, please have a family member take you directly to the ED at this time. Do not delay. Based on concerns about condition, if you do not follow up in th e ED, you may risk poor outcomes including worsening of condition, delayed treatment and potentially life threatening issues. If you have declined to go to the ED at this time, you should call your PCP immediately to set up a follow up appointment.  Go to ED for red flag symptoms, including; fevers you cannot reduce with Tylenol/Motrin, severe headaches, vision changes, numbness/weakness in part of the body, lethargy, confusion, intractable vomiting, severe dehydration, chest pain, breathing difficulty, severe persistent abdominal or pelvic pain, signs of severe infection (increased redness, swelling of an area), feeling faint or passing out, dizziness, etc. You should especially go to the ED for sudden acute worsening of condition if you do not elect to go at this time.      ED Prescriptions   None    PDMP not reviewed this encounter.   Danton Clap, PA-C 09/20/21 1536

## 2021-09-20 NOTE — ED Notes (Signed)
Patient is being discharged from the Urgent Care and sent to the [facility] Emergency Department via private vehicle . Per Laurene Footman, PA, patient is in need of higher level of care due to Abnormal EKG and Labs. Patient is aware and verbalizes understanding of plan of care.  Vitals:   09/20/21 1237  BP: 103/67  Pulse: 65  Resp: 16  Temp: 98.3 F (36.8 C)  SpO2: 96%

## 2021-09-21 ENCOUNTER — Telehealth: Payer: Self-pay | Admitting: Emergency Medicine

## 2021-09-21 NOTE — Telephone Encounter (Signed)
Patient's daughter-in-law called and is requesting we send an antibiotic and potassium for replacement for patient from visit this past weekend.  Patient was sent to the ER for further evaluation and left AMA.  Reviewed with Dr Lanny Cramp, who states patient would need to be seen again to verify he is stable and there have been no other concerning changes prior to [place] being able to treat.  Updated daughter-in-law who verbalized understanding

## 2021-09-22 ENCOUNTER — Encounter: Payer: Self-pay | Admitting: Emergency Medicine

## 2021-09-22 ENCOUNTER — Emergency Department
Admission: EM | Admit: 2021-09-22 | Discharge: 2021-09-22 | Disposition: A | Discharge status: Home / Self Care | Payer: Medicare Other | Attending: Emergency Medicine | Admitting: Emergency Medicine

## 2021-09-22 ENCOUNTER — Encounter: Payer: Self-pay | Admitting: Urology

## 2021-09-22 ENCOUNTER — Other Ambulatory Visit: Payer: Self-pay

## 2021-09-22 ENCOUNTER — Ambulatory Visit (INDEPENDENT_AMBULATORY_CARE_PROVIDER_SITE_OTHER): Payer: Medicare Other | Admitting: Urology

## 2021-09-22 VITALS — BP 137/71 | HR 66 | Ht 71.0 in | Wt 230.0 lb

## 2021-09-22 DIAGNOSIS — R531 Weakness: Secondary | ICD-10-CM | POA: Diagnosis not present

## 2021-09-22 DIAGNOSIS — I10 Essential (primary) hypertension: Secondary | ICD-10-CM | POA: Diagnosis not present

## 2021-09-22 DIAGNOSIS — Z7982 Long term (current) use of aspirin: Secondary | ICD-10-CM | POA: Insufficient documentation

## 2021-09-22 DIAGNOSIS — E876 Hypokalemia: Secondary | ICD-10-CM | POA: Insufficient documentation

## 2021-09-22 DIAGNOSIS — R350 Frequency of micturition: Secondary | ICD-10-CM | POA: Diagnosis present

## 2021-09-22 DIAGNOSIS — R9431 Abnormal electrocardiogram [ECG] [EKG]: Secondary | ICD-10-CM | POA: Diagnosis not present

## 2021-09-22 DIAGNOSIS — N390 Urinary tract infection, site not specified: Secondary | ICD-10-CM | POA: Diagnosis not present

## 2021-09-22 DIAGNOSIS — Z79899 Other long term (current) drug therapy: Secondary | ICD-10-CM | POA: Insufficient documentation

## 2021-09-22 DIAGNOSIS — Z87891 Personal history of nicotine dependence: Secondary | ICD-10-CM | POA: Insufficient documentation

## 2021-09-22 DIAGNOSIS — Z7901 Long term (current) use of anticoagulants: Secondary | ICD-10-CM | POA: Diagnosis not present

## 2021-09-22 DIAGNOSIS — R410 Disorientation, unspecified: Secondary | ICD-10-CM | POA: Insufficient documentation

## 2021-09-22 DIAGNOSIS — Z7984 Long term (current) use of oral hypoglycemic drugs: Secondary | ICD-10-CM | POA: Diagnosis not present

## 2021-09-22 DIAGNOSIS — I2510 Atherosclerotic heart disease of native coronary artery without angina pectoris: Secondary | ICD-10-CM | POA: Insufficient documentation

## 2021-09-22 DIAGNOSIS — J441 Chronic obstructive pulmonary disease with (acute) exacerbation: Secondary | ICD-10-CM | POA: Diagnosis not present

## 2021-09-22 LAB — CBC
HCT: 35.5 % — ABNORMAL LOW (ref 39.0–52.0)
Hemoglobin: 12.5 g/dL — ABNORMAL LOW (ref 13.0–17.0)
MCH: 31.7 pg (ref 26.0–34.0)
MCHC: 35.2 g/dL (ref 30.0–36.0)
MCV: 90.1 fL (ref 80.0–100.0)
Platelets: 250 10*3/uL (ref 150–400)
RBC: 3.94 MIL/uL — ABNORMAL LOW (ref 4.22–5.81)
RDW: 12.7 % (ref 11.5–15.5)
WBC: 10.6 10*3/uL — ABNORMAL HIGH (ref 4.0–10.5)
nRBC: 0 % (ref 0.0–0.2)

## 2021-09-22 LAB — URINALYSIS, ROUTINE W REFLEX MICROSCOPIC
Bilirubin Urine: NEGATIVE
Glucose, UA: NEGATIVE mg/dL
Ketones, ur: NEGATIVE mg/dL
Nitrite: NEGATIVE
Protein, ur: NEGATIVE mg/dL
Specific Gravity, Urine: 1.009 (ref 1.005–1.030)
WBC, UA: >50 WBC/hpf — ABNORMAL HIGH (ref 0–5)
pH: 5 (ref 5.0–8.0)

## 2021-09-22 LAB — BASIC METABOLIC PANEL
Anion gap: 9 (ref 5–15)
BUN: 12 mg/dL (ref 8–23)
CO2: 32 mmol/L (ref 22–32)
Calcium: 9.3 mg/dL (ref 8.9–10.3)
Chloride: 99 mmol/L (ref 98–111)
Creatinine, Ser: 0.91 mg/dL (ref 0.61–1.24)
GFR, Estimated: >60 mL/min (ref >60)
Glucose, Bld: 117 mg/dL — ABNORMAL HIGH (ref 70–99)
Potassium: 2.9 mmol/L — ABNORMAL LOW (ref 3.5–5.1)
Sodium: 140 mmol/L (ref 135–145)

## 2021-09-22 LAB — URINE CULTURE: Culture: >=100000 — AB

## 2021-09-22 MED ORDER — CEFPODOXIME PROXETIL 200 MG PO TABS: 200.0000 mg | ORAL_TABLET | Freq: Two times a day (BID) | ORAL | 0 refills | Status: AC

## 2021-09-22 MED ORDER — POTASSIUM CHLORIDE CRYS ER 20 MEQ PO TBCR
40.0000 meq | EXTENDED_RELEASE_TABLET | Freq: Once | ORAL | Status: AC
  Administered 2021-09-22: 40 meq via ORAL
  Filled 2021-09-22: qty 2

## 2021-09-22 MED ORDER — POTASSIUM CHLORIDE CRYS ER 20 MEQ PO TBCR: 20.0000 meq | EXTENDED_RELEASE_TABLET | Freq: Every day | ORAL | 0 refills | Status: DC

## 2021-09-22 NOTE — ED Triage Notes (Signed)
Pt states that he went to urgent care Sunday due to having a couple episodes of confusion, pt states that he was told at urgent care he may have a uti, uti diagnosed at urgent care and also told his potassium was abnormal and his ekg looked different to them, pt came to the ER and states after 5 hours of waiting he went home, pt states that he went to his urologist and was told that since he left the ER with potential heart issues she couldn't see him Pt states the only reason why he is here to get treatment for uti

## 2021-09-22 NOTE — Discharge Instructions (Addendum)
We are treating you for a UTI.  Please follow-up with your primary care doctor for your urine culture results.  Your potassium was also very low and we have given you some repletion but you need to continue taking the pills at home.  Please follow-up for recheck by Friday to make sure that it is increasing.  Return to ER if develop shortness of breath, chest pain, lightheadedness or any other concern

## 2021-09-22 NOTE — Progress Notes (Signed)
Mr. Stockert presented to the office for symptoms of UTI.  Upon presentation, I reviewed his chart and noted that he was seen in the emergency room department 2 days ago and left AMA.  In the emergency room, he had a EKG and was noted to have QT long prolongation and blood work noted hypokalemia.  His daughter-in-law called the ED yesterday requesting antibiotics and potassium pills, but they were again instructed to return to the ED to verify he was stable and there had been no other changes.  He did not return to the ED, but he made an appointment with [place] with the complaint of UTI symptoms.  When I discussed his medical situation with me, he become argumentative stating the ED told him his EKG had not changed since  his last EKG and that he was stable.  I reiterated with the emergency room told his daughter-in-law yesterday when they called asking for antibiotics and potassium pills that I cannot safely treat him not knowing his current medical status and to return to the emergency room immediately as with our current knowledge he is at high risk for cardiac arrhythmia.  He then returned to the ED.    I spent 15 minutes on the day of the encounter to include pre-visit record review, face-to-face time with the patient, and post-visit ordering of tests.

## 2021-09-22 NOTE — ED Provider Notes (Addendum)
[facility] Emergency Department Provider Note  ____________________________________________   Event Date/Time   First MD Initiated Contact with Patient 09/22/21 1207     (approximate)  I have reviewed the triage vital signs and the nursing notes.   HISTORY  Chief Complaint Urinary Frequency    [name] is a 65 y.o. male with COPD on oxygen at home, hypertension, hyperlipidemia who comes in with concern for urinary frequency.  Patient was seen in the ER a few days ago but left without being seen and had an EKG and noted to have QT longer prolongation at urgent care and blood work noting hypokalemia.  Patient had an EKG on 10/30 here that showed sinus bradycardia rate of 58 with type I AV block with a QTC of 445.  Patient states that initially when he came in a few days ago he has of some episodes of confusion but states that is all now resolved.  He states that he has not fallen and hit his head.  He states that his confusion is now gone but he still having some urinary frequency.  He was concerned that he might have a UTI.  Denies any abdominal pain, genital pain, rectal pain.  This been constant, nothing makes it better or worse.  He states that he is here to get antibiotics for his UTI as well as to make sure that his potassium level is okay.  He denies Potassium supplementation or having issues with potassium previously.  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   Compression fracture    L2   COPD (chronic obstructive pulmonary disease) (HCC)    bronchitis   CVA (cerebral infarction)    Disorder of vocal cord    Fatty liver    GERD (gastroesophageal reflux disease)    Hx of cardiovascular stress test     ETT/Lexiscan-Myoview (10/15):  Inferior, inferoapical, apical lateral defect c/w mild ischemia, EF 66%; Intermediate Risk   Hyperlipidemia    Hypertension    Right shoulder pain    2/2 partial rotator cuff tear, tendinopathy, mild subacromial/subdeltioid bursitis, A/C joint arthropathy per MRI done in 3/07   Tobacco abuse     Patient Active Problem List   Diagnosis Date Noted   Chronic respiratory failure with hypoxia ([place]) 03/30/2021   COPD exacerbation ([place]) 02/27/2021   History of CVA (cerebrovascular accident) 02/27/2021   Chronic pain, radicular 02/27/2021   Chronic, continuous use of opioids 02/27/2021   Unstable angina ([place]) 02/19/2021   Cigarette nicotine dependence without complication 73/71/0626   Acute lower UTI 02/19/2017   UTI (urinary tract infection) 02/19/2017   Left inguinal hernia 08/12/2016   Elevated troponin 05/08/2016   Anal fissure 03/15/2016   External hemorrhoid 03/15/2016   Acute viral pharyngitis 03/20/2015   Olecranon bursitis of left elbow 94/85/4627   Embolic stroke ([place]) 03/50/0938   Carotid stenosis 10/15/2014   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/19/2014   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   PAD (peripheral artery disease) ([place]) 08/23/2014   Preventative health care 08/12/2014   Occlusion and stenosis of carotid artery without mention of cerebral infarction 07/25/2014   Subclavian steal syndrome  07/25/2014   CAD (coronary artery disease) 07/10/2014   Nocturia 04/17/2014   Testicular pain, right 04/17/2014   Urethral stricture 04/17/2014   CVA (cerebral infarction) 04/10/2014   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   Hematuria 08/06/2013   Compression fracture of L2, with back and right groin pain 07/18/2013   History of colon polyps 05/18/2012   COPD (chronic obstructive pulmonary disease) ([place]) 12/24/2009   Hyperlipemia 09/20/2006   TOBACCO ABUSE 09/20/2006   Essential hypertension 09/20/2006    GERD 09/20/2006    Past Surgical History:  Procedure Laterality Date   ANTERIOR LAT LUMBAR FUSION N/A 12/18/2013   Procedure: Lumbar Two Anteriorlateral Corpectomy w/ cage, interbody fusion, anterior plating, Lumbar One to Lumbar Three percutaneous pedicle screws;  Surgeon: Charlie Pitter, MD;  Location: [facility];  Service: Neurosurgery;  Laterality: N/A;   AORTA -INNOMIATE BYPASS N/A 10/15/2014   Procedure: ENDARTERCTOMY OF  INNOMINATE ARTERY ;  Surgeon: Elam Dutch, MD;  Location: [place];  Service: Vascular;  Laterality: N/A;   ARCH AORTOGRAM N/A 08/23/2014   Procedure: ARCH AORTOGRAM;  Surgeon: Elam Dutch, MD;  Location: [facility] CATH LAB;  Service: Cardiovascular;  Laterality: N/A;   arch aortogram, left carotid,left subclavian angiogram  08/23/2014   CAROTID ANGIOGRAM Left 08/23/2014   Procedure: CAROTID ANGIOGRAM;  Surgeon: Elam Dutch, MD;  Location: [facility] CATH LAB;  Service: Cardiovascular;  Laterality: Left;   CHOLECYSTECTOMY  12/2016   ENDARTERECTOMY Right 10/15/2014   Procedure: ENDARTERECTOMY RIGHT SUBCLAVIAN ARTERY;  Surgeon: Elam Dutch, MD;  Location: [place];  Service: Vascular;  Laterality: Right;   ENDARTERECTOMY Right 10/15/2014   Procedure: ENDARTERECTOMY RIGHT COMMON CAROTID ARTERY;  Surgeon: Elam Dutch, MD;  Location: [place];  Service: Vascular;  Laterality: Right;   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   HERNIA REPAIR     LEFT HEART CATH AND CORONARY ANGIOGRAPHY N/A 03/17/2021   Procedure: LEFT HEART CATH AND CORONARY ANGIOGRAPHY;  Surgeon: Dionisio David, MD;  Location: [place] CV LAB;  Service: Cardiovascular;  Laterality: N/A;   LEFT HEART CATHETERIZATION WITH CORONARY ANGIOGRAM N/A 09/12/2014   Procedure: LEFT HEART CATHETERIZATION WITH CORONARY ANGIOGRAM;  Surgeon: Josue Hector, MD;  Location: [facility] CATH LAB;  Service: Cardiovascular;  Laterality: N/A;   LUMBAR SPINE SURGERY  12/18/2013   L2     DR POOL   NECK SURGERY     PATCH  ANGIOPLASTY Right 10/15/2014   Procedure: PATCH ANGIOPLASTY OF RIGHT SUBCLAVIAN ARTERY , RIGHT COMMON CAROTID ARTERY & INNOMINATE ARTERY;  Surgeon: Elam Dutch, MD;  Location: [place];  Service: Vascular;  Laterality: Right;   STERNOTOMY N/A 10/15/2014   Procedure: PARTIAL STERNOTOMY & PLATING OF STERNUM;  Surgeon: Elam Dutch, MD;  Location: [place];  Service: Vascular;  Laterality: N/A;   TEE WITHOUT CARDIOVERSION N/A 12/25/2013   Procedure: TRANSESOPHAGEAL ECHOCARDIOGRAM (TEE);  Surgeon: Larey Dresser, MD;  Location: [place];  Service: Cardiovascular;  Laterality: N/A;    Prior to Admission medications   Medication Sig Start Date End Date Taking? Authorizing Provider  albuterol (PROVENTIL) (2.5 MG/3ML) 0.083% nebulizer solution USE 1 AMPULE IN NEBULIZER EVERY 6 HOURS AS NEEDED. 06/29/21   Tyler Pita, MD  amLODipine (NORVASC) 5 MG tablet Take 5 mg by mouth daily.    [provider]  arformoterol (BROVANA) 15 MCG/2ML NEBU Take 2 mLs (15 mcg total) by nebulization 2 (two) times daily. 06/11/21 06/06/22  Vernard Gambles  L, MD  aspirin EC 81 MG tablet Take 81 mg by mouth daily.    [provider]  atorvastatin (LIPITOR) 80 MG tablet Take 80 mg by mouth daily. 05/17/20   [provider]  budesonide (PULMICORT) 0.25 MG/2ML nebulizer solution Take 2 mLs (0.25 mg total) by nebulization 2 (two) times daily. 03/30/21   Tyler Pita, MD  clopidogrel (PLAVIX) 75 MG tablet Take 75 mg by mouth daily. 02/14/21   [provider]  EPINEPHrine 0.3 mg/0.3 mL IJ SOAJ injection Inject 0.3 mg into the muscle as needed for anaphylaxis. 04/04/20   [provider]  FLUoxetine (PROZAC) 20 MG capsule Take 20 mg by mouth daily. 06/01/21   [provider]  isosorbide mononitrate (IMDUR) 30 MG 24 hr tablet Take 30 mg by mouth daily.    [provider]  levocetirizine (XYZAL) 5 MG tablet Take 5 mg by mouth at bedtime. 07/15/20   [provider]  linaclotide Rolan Lipa) 290 MCG CAPS capsule Take 290 mcg by mouth daily before breakfast. 12/05/19   [provider]  lisinopril-hydrochlorothiazide (ZESTORETIC) 20-25 MG tablet Take 1 tablet by mouth daily. 06/01/21   [provider]  meloxicam (MOBIC) 15 MG tablet TAKE 1 TABLET IN THE MORNING WITH FOOD 05/26/21   [provider]  metFORMIN (GLUCOPHAGE) 500 MG tablet Take 500 mg by mouth daily. 07/22/20   [provider]  montelukast (SINGULAIR) 10 MG tablet Take 10 mg by mouth daily. 07/15/20   [provider]  Multiple Vitamin (MULTI-VITAMINS) TABS Take 1 tablet by mouth daily.    [provider]  naloxone [facility]) nasal spray 4 mg/0.1 mL Place 0.4 mg into the nose once. 09/02/16   [provider]  nitroGLYCERIN (NITROSTAT) 0.4 MG SL tablet Place 0.4 mg under the tongue every 5 (five) minutes as needed for chest pain. 12/12/20   [provider]  Olopatadine HCl 0.2 % SOLN INSTILL 1 DROPS INTO EACH EYE ONCE DAILY AS NEEDED FOR ITCHY EYES. 05/05/21   [provider]  pantoprazole (PROTONIX) 40 MG tablet Take 1 tablet by mouth daily. 06/01/21   [provider]  polyethylene glycol (MIRALAX / GLYCOLAX) packet Take 17 g by mouth daily as needed (for constipation).    [provider]  predniSONE (STERAPRED UNI-PAK 21 TAB) 10 MG (21) TBPK tablet Take as directed in the package 07/30/21   Tyler Pita, MD  pregabalin (LYRICA) 150 MG capsule Take 150 mg by mouth daily. 09/20/19   [provider]  PROAIR HFA 108 (90 BASE) MCG/ACT inhaler INHALE 2 PUFFS INTO THE LUNGS EVERY 6 (SIX) HOURS AS NEEDED FOR WHEEZING. Patient taking differently: Inhale 2 puffs into the lungs every 6 (six) hours as needed for shortness of breath or wheezing. 05/01/15   Rivet, Sindy Guadeloupe, MD  rOPINIRole (REQUIP) 1 MG tablet Take 1 tablet by mouth at bedtime. 06/01/21   [provider]  spironolactone (ALDACTONE)  25 MG tablet Take 25 mg by mouth daily. 05/26/21   [provider]  tamsulosin (FLOMAX) 0.4 MG CAPS capsule TAKE (1) CAPSULE BY MOUTH ONCE DAILY. Patient taking differently: Take 0.4 mg by mouth daily. 11/07/20   McGowan, Shannon A, PA-C  YUPELRI 175 MCG/3ML nebulizer solution Inhale into the lungs. 06/01/21   [provider]    Allergies Bee venom  Family History  Problem Relation Age of Onset   Heart disease Father    Heart attack Father    Diabetes Paternal Grandmother  1 st degree relatives   Hypertension Paternal Grandmother    Diabetes Brother    Heart disease Brother    Muscular dystrophy Brother    Muscular dystrophy Sister    Prostate cancer Neg Hx    Kidney cancer Neg Hx    Bladder Cancer Neg Hx     Social History Social History   Tobacco Use   Smoking status: Former    Packs/day: 2.00    Years: 48.00    Pack years: 96.00    Types: Cigarettes    Quit date: 11/22/2020    Years since quitting: 0.8   Smokeless tobacco: Never  Substance Use Topics   Alcohol use: No    Alcohol/week: 0.0 standard drinks   Drug use: No    Types: Cocaine, Marijuana    Comment: no recent use      Review of Systems Constitutional: No fever/chills Eyes: No visual changes. ENT: No sore throat. Cardiovascular: Denies chest pain. Respiratory: Denies shortness of breath. Gastrointestinal: No abdominal pain.  No nausea, no vomiting.  No diarrhea.  No constipation. Genitourinary: Positive increased urinary frequency Musculoskeletal: Negative for back pain. Skin: Negative for rash. Neurological: Negative for headaches, focal weakness or numbness. All other ROS negative ____________________________________________   PHYSICAL EXAM:  VITAL SIGNS: ED Triage Vitals  Enc Vitals Group     BP 09/22/21 0952 131/72     Pulse Rate 09/22/21 0952 61     Resp 09/22/21 1043 18     Temp 09/22/21 0952 98.2 F (36.8 C)     Temp Source 09/22/21 0952 Oral     SpO2  09/22/21 0952 97 %     Weight 09/22/21 1044 230 lb (104.3 kg)     Height 09/22/21 1044 5\' 11"  (1.803 m)     Head Circumference --      Peak Flow --      Pain Score 09/22/21 1044 0     Pain Loc --      Pain Edu? --      Excl. in [place]? --     Constitutional: Alert and oriented. Well appearing and in no acute distress. Eyes: Conjunctivae are normal. EOMI. Head: Atraumatic. Nose: No congestion/rhinnorhea. Mouth/Throat: Mucous membranes are moist.   Neck: No stridor. Trachea Midline. FROM Cardiovascular: Normal rate, regular rhythm. Grossly normal heart sounds.  Good peripheral circulation. Respiratory: Normal respiratory effort.  No retractions. Lungs CTAB. Gastrointestinal: Soft and nontender. No distention. No abdominal bruits.  Musculoskeletal: No lower extremity tenderness nor edema.  No joint effusions. Neurologic:  Normal speech and language. No gross focal neurologic deficits are appreciated.  Skin:  Skin is warm, dry and intact. No rash noted. Psychiatric: Mood and affect are normal. Speech and behavior are normal. GU: Deferred   ____________________________________________   LABS (all labs ordered are listed, but only abnormal results are displayed)  Labs Reviewed  URINALYSIS, ROUTINE W REFLEX MICROSCOPIC - Abnormal; Notable for the following components:      Result Value   Color, Urine YELLOW (*)    APPearance HAZY (*)    Hgb urine dipstick MODERATE (*)    Leukocytes,Ua LARGE (*)    WBC, UA >50 (*)    Bacteria, UA RARE (*)    All other components within normal limits  BASIC METABOLIC PANEL - Abnormal; Notable for the following components:   Potassium 2.9 (*)    Glucose, Bld 117 (*)    All other components within normal limits  CBC - Abnormal; Notable for the  following components:   WBC 10.6 (*)    RBC 3.94 (*)    Hemoglobin 12.5 (*)    HCT 35.5 (*)    All other components within normal limits   ____________________________________________   ED ECG REPORT I,  Vanessa [place], the attending physician, personally viewed and interpreted this ECG.  EKG is sinus bradycardia rate of 48, no ST elevations, no T wave inversions and QTC is 427 ____________________________________________    PROCEDURES  Procedure(s) performed (including Critical Care):  Procedures   ____________________________________________   INITIAL IMPRESSION / ASSESSMENT AND PLAN / ED COURSE  Bradley Sawyer was evaluated in Emergency Department on 09/22/2021 for the symptoms described in the history of present illness. He was evaluated in the context of the global COVID-19 pandemic, which necessitated consideration that the patient might be at risk for infection with the SARS-CoV-2 virus that causes COVID-19. Institutional protocols and algorithms that pertain to the evaluation of patients at risk for COVID-19 are in a state of rapid change based on information released by regulatory bodies including the CDC and federal and state organizations. These policies and algorithms were followed during the patient's care in the ED.    Patient comes in with concerns for urinary frequency.  Urine looks consistent with UTI.  Will send for culture.  Does not really seem to have any symptoms to suggest prostatitis.  Patient is very well-appearing does not appear bacteremic.  Given the low potassium previously we will recheck today as well as get an EKG to evaluate for QTC.  Patient is on a K sparing spironolactone but even with this his potassium is low so will still require some repletion  Patient is EKG shows sinus bradycardia.  I reviewed his prior EKGs and he typically runs in the 50s to low 60s.  I doubt that the slightly low potassium is causing his heart rate to be low.  I Reevaluated patient he denies any lightheadedness or dizziness.  When I am in the room his heart rates are in the 50s.  Fortunately there is no evidence of prolonged QTC at this time and patient would prefer to be treated  outpatient which I think is reasonable.  We will give his first dose of potassium here and start him on some potassium and have him follow-up with his primary care doctor for a recheck of potassium in and making sure that his urine culture is sensitive.  I discussed the provisional nature of ED diagnosis, the treatment so far, the ongoing plan of care, follow up appointments and return precautions with the patient and any family or support people present. They expressed understanding and agreed with the plan, discharged home.            ____________________________________________   FINAL CLINICAL IMPRESSION(S) / ED DIAGNOSES   Final diagnoses:  Hypokalemia  Urinary tract infection without hematuria, site unspecified      MEDICATIONS GIVEN DURING THIS VISIT:  Medications  potassium chloride SA (KLOR-CON) CR tablet 40 mEq (has no administration in time range)     ED Discharge Orders          Ordered    potassium chloride SA (KLOR-CON) 20 MEQ tablet  Daily        09/22/21 1315    cefpodoxime (VANTIN) 200 MG tablet  2 times daily        09/22/21 1315             Note:  This document was prepared using  [employer] forces training and education officer and may include unintentional dictation errors.    Vanessa [place], MD 09/22/21 1317    Vanessa , MD 09/22/21 1318

## 2021-09-24 LAB — URINE CULTURE: Culture: 90000 — AB

## 2021-09-25 LAB — CULTURE, BLOOD (ROUTINE X 2)
Culture: NO GROWTH
Culture: NO GROWTH

## 2021-10-01 ENCOUNTER — Telehealth: Payer: Self-pay | Admitting: Pharmacy Technician

## 2021-10-01 ENCOUNTER — Other Ambulatory Visit (HOSPITAL_COMMUNITY): Payer: Self-pay

## 2021-10-01 DIAGNOSIS — J449 Chronic obstructive pulmonary disease, unspecified: Secondary | ICD-10-CM

## 2021-10-01 NOTE — Telephone Encounter (Signed)
Patient Advocate Encounter  Received notification from [place] that prior authorization for [facility] is required.   PA submitted on 11.10.22 Key [id number] Status is pending   [facility] will continue to follow  Luciano Cutter, CPhT Patient Advocate Phone: [phone] Fax:  [phone]

## 2021-10-05 NOTE — Telephone Encounter (Signed)
Called and spoke with patient, provided update on PA for [place].  Patient states that he has oxygen through [facility].  Order for [facility] sent to [employer].

## 2021-10-05 NOTE — Addendum Note (Signed)
Addended by: Vanessa Barbara on: 10/05/2021 12:32 PM   Modules accepted: Orders

## 2021-10-05 NOTE — Telephone Encounter (Signed)
Received a fax regarding Prior Authorization from [place] for [facility]. Authorization has been DENIED because: PT DOES NOT RESIDE IN A LTC FACILITY.

## 2021-10-06 ENCOUNTER — Ambulatory Visit: Payer: Medicare HMO | Admitting: Adult Health

## 2021-10-08 ENCOUNTER — Ambulatory Visit (INDEPENDENT_AMBULATORY_CARE_PROVIDER_SITE_OTHER): Payer: Medicare Other | Admitting: Pulmonary Disease

## 2021-10-08 ENCOUNTER — Other Ambulatory Visit: Payer: Self-pay

## 2021-10-08 ENCOUNTER — Encounter: Payer: Self-pay | Admitting: Pulmonary Disease

## 2021-10-08 VITALS — BP 140/80 | HR 88 | Temp 97.0°F | Ht 71.0 in | Wt 224.6 lb

## 2021-10-08 DIAGNOSIS — J9611 Chronic respiratory failure with hypoxia: Secondary | ICD-10-CM

## 2021-10-08 DIAGNOSIS — Z23 Encounter for immunization: Secondary | ICD-10-CM | POA: Diagnosis not present

## 2021-10-08 DIAGNOSIS — I5189 Other ill-defined heart diseases: Secondary | ICD-10-CM | POA: Diagnosis not present

## 2021-10-08 DIAGNOSIS — J449 Chronic obstructive pulmonary disease, unspecified: Secondary | ICD-10-CM

## 2021-10-08 DIAGNOSIS — Z87891 Personal history of nicotine dependence: Secondary | ICD-10-CM | POA: Diagnosis not present

## 2021-10-08 NOTE — Patient Instructions (Signed)
You have gotten your flu vaccine today.  We are going to get echocardiogram to reevaluate your heart.  We will see you in follow-up in 3 months time call sooner should any new problems arise.  We will call you with the results of your echocardiogram and if any other referrals need to be made.

## 2021-10-08 NOTE — Progress Notes (Signed)
Subjective:    Patient ID: Bradley Sawyer, male    DOB: 11/02/1956, 65 y.o.   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  Patient is oxygen dependent 24/7 due to chronic respiratory failure with hypoxia.  He is on 3 L/min nasal cannula O2 via POC.  He was last seen on 30 July 2021 by me.  This is a scheduled visit.  He is currently maintained on Brovana, Pulmicort and Yupelri via nebulizer.  He is actually doing fairly well with the nebulizer therapy as he does not have enough breath-holding capacity to use MDIs or DPI's effectively.  He has not had any recent exacerbations.  He wants "an intervention" for his COPD.  He has considered endobronchial valves for this.  However I have explained to him that his type of COPD may not be optimal for valve placement.  But I have encouraged him to consider total lung denervation.  He is considering all these options.  We did check IgE and allergy panel on the last visit as well as a CBC with differential.  He does not have eosinophilia nor elevated IgE.  This would have helped with qualifying him for Dupixent which may help him.  He has not had any fevers, chills or sweats.  Remarkably, he has not had any recent issues with wheezing/bronchospasm.  He has had no chest pain, lower extremity edema, orthopnea or paroxysmal nocturnal dyspnea.  No calf tenderness or edema.  No palpitations.  He has known grade 2 diastolic dysfunction, he has had a left heart cath but may require right heart cath to evaluate for potential pulmonary hypertension.  His dyspnea is out of proportion to his documented FEV1.  Alpha-1 phenotype is normal.   Review of Systems A 10 point review of systems was performed and it is as noted above otherwise  negative.  Patient Active Problem List   Diagnosis Date Noted   Chronic respiratory failure with hypoxia ([place]) 03/30/2021   COPD exacerbation ([place]) 02/27/2021   History of CVA (cerebrovascular accident) 02/27/2021   Chronic pain, radicular 02/27/2021   Chronic, continuous use of opioids 02/27/2021   Unstable angina ([place]) 02/19/2021   Cigarette nicotine dependence without complication 29/92/4268   Acute lower UTI 02/19/2017   UTI (urinary tract infection) 02/19/2017   Left inguinal hernia 08/12/2016   Elevated troponin 05/08/2016   Anal fissure 03/15/2016   External hemorrhoid 03/15/2016   Acute viral pharyngitis 03/20/2015   Olecranon bursitis of left elbow 34/19/6222   Embolic stroke ([place]) 97/98/9211   Carotid stenosis 10/15/2014   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/2014   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   PAD (peripheral artery disease) ([place]) 08/23/2014   Preventative health care 08/12/2014   Occlusion and stenosis of carotid artery without mention of cerebral infarction 07/25/2014   Subclavian steal syndrome 07/25/2014   CAD (coronary artery disease) 07/10/2014   Nocturia 04/17/2014   Testicular pain, right 04/17/2014   Urethral stricture 04/17/2014   CVA (cerebral infarction) 04/10/2014   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   Hematuria 08/06/2013   Compression fracture of L2, with back and right groin pain 07/18/2013   History of colon polyps 05/18/2012   COPD (chronic obstructive pulmonary disease) ([place]) 12/24/2009  Hyperlipemia 09/20/2006   TOBACCO ABUSE 09/20/2006   Essential hypertension 09/20/2006   GERD 09/20/2006   Social History   Tobacco Use   Smoking status: Former    Packs/day: 2.00    Years: 48.00    Pack years: 96.00    Types: Cigarettes    Quit date: 11/22/2020    Years since quitting: 0.8   Smokeless tobacco: Never  Substance Use Topics   Alcohol use: No    Alcohol/week: 0.0 standard drinks    Allergies  Allergen Reactions   Bee Venom Swelling   Current Meds  Medication Sig   albuterol (PROVENTIL) (2.5 MG/3ML) 0.083% nebulizer solution USE 1 AMPULE IN NEBULIZER EVERY 6 HOURS AS NEEDED.   amLODipine (NORVASC) 5 MG tablet Take 5 mg by mouth daily.   arformoterol (BROVANA) 15 MCG/2ML NEBU Take 2 mLs (15 mcg total) by nebulization 2 (two) times daily.   aspirin EC 81 MG tablet Take 81 mg by mouth daily.   atorvastatin (LIPITOR) 80 MG tablet Take 80 mg by mouth daily.   budesonide (PULMICORT) 0.25 MG/2ML nebulizer solution Take 2 mLs (0.25 mg total) by nebulization 2 (two) times daily.   clopidogrel (PLAVIX) 75 MG tablet Take 75 mg by mouth daily.   EPINEPHrine 0.3 mg/0.3 mL IJ SOAJ injection Inject 0.3 mg into the muscle as needed for anaphylaxis.   FLUoxetine (PROZAC) 20 MG capsule Take 20 mg by mouth daily.   isosorbide mononitrate (IMDUR) 30 MG 24 hr tablet Take 30 mg by mouth daily.   levocetirizine (XYZAL) 5 MG tablet Take 5 mg by mouth at bedtime.   linaclotide (LINZESS) 290 MCG CAPS capsule Take 290 mcg by mouth daily before breakfast.   lisinopril-hydrochlorothiazide (ZESTORETIC) 20-25 MG tablet Take 1 tablet by mouth daily.   meloxicam (MOBIC) 15 MG tablet TAKE 1 TABLET IN THE MORNING WITH FOOD   metFORMIN (GLUCOPHAGE) 500 MG tablet Take 500 mg by mouth daily.   montelukast (SINGULAIR) 10 MG tablet Take 10 mg by mouth daily.   Multiple Vitamin (MULTI-VITAMINS) TABS Take 1 tablet by mouth daily.   Olopatadine HCl 0.2 % SOLN INSTILL 1 DROPS INTO EACH EYE ONCE DAILY AS NEEDED FOR ITCHY EYES.   pantoprazole (PROTONIX) 40 MG tablet Take 1 tablet by mouth daily.   polyethylene glycol (MIRALAX / GLYCOLAX) packet Take 17 g by mouth daily as needed (for constipation).   predniSONE (STERAPRED UNI-PAK 21 TAB) 10 MG (21) TBPK tablet Take as directed in the package   pregabalin (LYRICA) 150 MG capsule Take 150 mg by mouth daily.   PROAIR HFA 108 (90 BASE) MCG/ACT inhaler INHALE 2  PUFFS INTO THE LUNGS EVERY 6 (SIX) HOURS AS NEEDED FOR WHEEZING. (Patient taking differently: Inhale 2 puffs into the lungs every 6 (six) hours as needed for shortness of breath or wheezing.)   rOPINIRole (REQUIP) 1 MG tablet Take 1 tablet by mouth at bedtime.   spironolactone (ALDACTONE) 25 MG tablet Take 25 mg by mouth daily.   tamsulosin (FLOMAX) 0.4 MG CAPS capsule TAKE (1) CAPSULE BY MOUTH ONCE DAILY. (Patient taking differently: Take 0.4 mg by mouth daily.)   YUPELRI 175 MCG/3ML nebulizer solution Inhale into the lungs.   Immunization History  Administered Date(s) Administered   DTaP 12/04/2018   Influenza, Seasonal, Injecte, Preservative Fre 08/01/2013, 08/12/2014, 08/26/2015, 08/06/2016, 09/26/2019   Influenza,inj,Quad PF,6+ Mos 08/01/2013, 08/12/2014   Influenza-Unspecified 10/30/2018, 09/26/2019, 09/10/2020   Moderna Sars-Covid-2 Vaccination 02/06/2020, 03/05/2020   Pneumococcal Polysaccharide-23 10/20/2014   Pneumococcal-Unspecified 10/20/2014  Objective:   Physical Exam BP 140/80 (BP Location: Left Arm, Patient Position: Sitting, Cuff Size: Normal)   Pulse 88   Temp (!) 97 F (36.1 C)   Ht 5\' 11"  (1.803 m)   Wt 224 lb 9.6 oz (101.9 kg)   SpO2 97%   BMI 31.33 kg/m  GENERAL: Well-developed, overweight gentleman, no acute distress.  Somewhat disheveled.  Comfortable on nasal cannula/POC at 3 L/min with conserving device. HEAD: Normocephalic, atraumatic. EYES: Pupils equal, round, reactive to light.  No scleral icterus. MOUTH: Nose/mouth/throat not examined due to masking requirements for COVID 19. NECK: Supple. No thyromegaly. Trachea midline. No JVD.  No adenopathy. PULMONARY: Good air entry bilaterally.  Coarse breath sounds, no wheezes noted!!! (a first for this patient) CARDIOVASCULAR: S1 and S2. Regular rate and rhythm.  No rubs, murmurs or gallops heard. ABDOMEN: Benign. MUSCULOSKELETAL: No joint deformity, no clubbing, no edema. NEUROLOGIC: No focal  deficits, no gait disturbance.  Speech is fluent. SKIN: Intact,warm,dry. PSYCH: Mood and behavior normal.     Assessment & Plan:     ICD-10-CM   1. COPD with chronic bronchitis and emphysema (HCC)  J44.9    Continue Brovana, Pulmicort and Yupelri No bronchospasm noted today (a first for this patient) Continue use of as needed albuterol Pulmonary rehab    2. Chronic respiratory failure with hypoxia (HCC)  J96.11    Continue oxygen supplementation at 3 L/min Patient compliant and notes benefit    3. Grade II diastolic dysfunction  X09.40 ECHOCARDIOGRAM COMPLETE   Reassess with 2D echo May be adding to his dyspnea issues    4. Former heavy cigarette smoker (20-39 per day)  Z87.891    No evidence of relapse    5. Need for immunization against influenza  Z23 Flu Vaccine QUAD High Dose(Fluad)   Received vaccination today     Patient is doing well from the pulmonary standpoint.  He did not exhibit any bronchospasm today which is a first for him.  Dyspnea is likely multifactorial.  COPD is as compensated as it is to get.  He has significant issues with obesity and deconditioning.  He has issues with diastolic dysfunction and possibly pulmonary hypertension.  2D echo will be repeated.  He has had prior left heart cath however no right heart cath.  He is to continue his medications as they are.  We will refer him to pulmonary rehabilitation.  We will make the appropriate referrals with regards to cardiac issues once 2D echo results are known.  We will see him in follow-up in 3 months time he is to contact us prior to that time should any new problems arise.  Renold Don, MD Advanced Bronchoscopy PCCM [facility]    *This note was dictated using voice recognition software/Dragon.  Despite best efforts to proofread, errors can occur which can change the meaning.  Any change was purely unintentional.

## 2021-10-09 ENCOUNTER — Other Ambulatory Visit (HOSPITAL_COMMUNITY): Payer: Self-pay

## 2021-10-09 ENCOUNTER — Telehealth: Payer: Self-pay | Admitting: Pulmonary Disease

## 2021-10-09 DIAGNOSIS — J449 Chronic obstructive pulmonary disease, unspecified: Secondary | ICD-10-CM

## 2021-10-09 MED ORDER — BUDESONIDE 0.25 MG/2ML IN SUSP: 0.2500 mg | Freq: Two times a day (BID) | RESPIRATORY_TRACT | 11 refills | Status: DC

## 2021-10-09 MED ORDER — YUPELRI 175 MCG/3ML IN SOLN: 175.0000 ug | Freq: Every day | RESPIRATORY_TRACT | 11 refills | Status: DC

## 2021-10-09 NOTE — Telephone Encounter (Signed)
Received verbal from patient to speak with [place]. [name] stated that Pulmicort and Yupelri requires PA. He also needs to schedule ECHO.  PA team please assist with PA. Rodena Piety, please assist with echo. Thanks

## 2021-10-09 NOTE — Telephone Encounter (Signed)
Spoke to [place] with [employer]. Kenney Houseman stated that patient changed part D plans.  Per Kenney Houseman both medications are cover, therefore the co pay is around 300 dollars with new plan.  [employer] can not file under part B.   Spoke to patient's daughter in law, [place] and relayed above information. [name] stated that patient can not afford this co pay.  She is willing to [facility] to [facility] to see if co pay will be any cheaper.  Rx sent.  Nothing further needed at this time.

## 2021-10-09 NOTE — Telephone Encounter (Signed)
Per Dr. Patsey Berthold verbally- refer to pulmonary rehab.  Referral placed.  [name] is aware and voiced his understanding.

## 2021-10-19 ENCOUNTER — Ambulatory Visit
Admission: RE | Admit: 2021-10-19 | Discharge: 2021-10-19 | Disposition: A | Discharge status: Home / Self Care | Payer: Medicare Other | Source: Ambulatory Visit | Attending: Pulmonary Disease | Admitting: Pulmonary Disease

## 2021-10-19 ENCOUNTER — Other Ambulatory Visit: Payer: Self-pay

## 2021-10-19 DIAGNOSIS — J449 Chronic obstructive pulmonary disease, unspecified: Secondary | ICD-10-CM | POA: Insufficient documentation

## 2021-10-19 DIAGNOSIS — I110 Hypertensive heart disease with heart failure: Secondary | ICD-10-CM | POA: Insufficient documentation

## 2021-10-19 DIAGNOSIS — I5031 Acute diastolic (congestive) heart failure: Secondary | ICD-10-CM | POA: Diagnosis present

## 2021-10-19 DIAGNOSIS — I5189 Other ill-defined heart diseases: Secondary | ICD-10-CM | POA: Diagnosis not present

## 2021-10-19 LAB — ECHOCARDIOGRAM COMPLETE: S' Lateral: 2.9 cm

## 2021-10-19 NOTE — Progress Notes (Signed)
*  PRELIMINARY RESULTS* Echocardiogram 2D Echocardiogram has been performed.  Bradley Sawyer 10/19/2021, 11:27 AM

## 2021-10-22 ENCOUNTER — Telehealth: Payer: Self-pay

## 2021-10-22 DIAGNOSIS — J42 Unspecified chronic bronchitis: Secondary | ICD-10-CM

## 2021-10-22 DIAGNOSIS — J441 Chronic obstructive pulmonary disease with (acute) exacerbation: Secondary | ICD-10-CM

## 2021-10-22 DIAGNOSIS — J449 Chronic obstructive pulmonary disease, unspecified: Secondary | ICD-10-CM

## 2021-10-22 NOTE — Telephone Encounter (Signed)
At this point I do not think his heart is the major cause.  What I wanted to make sure was that the artery that goes from the heart to the lungs was not under high pressure.  This does not seem to be the case.  He would benefit from pulmonary rehab and I recommend that he get referred.

## 2021-10-22 NOTE — Telephone Encounter (Signed)
Called and spoke to patient about test results,he wants to know if his SOB is coming from his heart?

## 2021-10-22 NOTE — Telephone Encounter (Signed)
Called and spoke to patient about Dr Domingo Dimes recommendation. Patient would like to try Pulmonary rehab. Will place order.

## 2021-10-23 NOTE — Telephone Encounter (Signed)
Created in error

## 2021-10-29 ENCOUNTER — Other Ambulatory Visit: Payer: Self-pay

## 2021-10-29 ENCOUNTER — Encounter: Payer: Medicare Other | Attending: Pulmonary Disease | Admitting: *Deleted

## 2021-10-29 ENCOUNTER — Encounter: Payer: Self-pay | Admitting: *Deleted

## 2021-10-29 DIAGNOSIS — R0600 Dyspnea, unspecified: Secondary | ICD-10-CM

## 2021-10-29 DIAGNOSIS — J449 Chronic obstructive pulmonary disease, unspecified: Secondary | ICD-10-CM

## 2021-10-29 NOTE — Progress Notes (Signed)
Virtual orientation call completed today.  He has an appointment on Date: 11/26/2021  for EP eval and gym Orientation.  Documentation of diagnosis can be found in [facility] Date: 10/08/2021 .

## 2021-11-02 ENCOUNTER — Telehealth: Payer: Self-pay | Admitting: Pulmonary Disease

## 2021-11-03 NOTE — Telephone Encounter (Signed)
Per our records, [facility] was placed to [facility] for [facility]s.  Spoke to patient, who stated that he plans to get meds through [facility].  [facility]m for [facility].

## 2021-11-04 ENCOUNTER — Telehealth: Payer: Self-pay | Admitting: Pulmonary Disease

## 2021-11-04 MED ORDER — FORMOTEROL FUMARATE 20 MCG/2ML IN NEBU: 20.0000 ug | INHALATION_SOLUTION | Freq: Two times a day (BID) | RESPIRATORY_TRACT | 3 refills | Status: DC

## 2021-11-04 NOTE — Telephone Encounter (Signed)
Fax for Performist printed, signed and faxed to [facility]. Nothing further needed.

## 2021-11-04 NOTE — Telephone Encounter (Signed)
Lm x2 for [facility].  Will close encounter per office protocol.

## 2021-11-05 ENCOUNTER — Other Ambulatory Visit: Payer: Self-pay | Admitting: Urology

## 2021-11-05 DIAGNOSIS — N401 Enlarged prostate with lower urinary tract symptoms: Secondary | ICD-10-CM

## 2021-11-05 NOTE — Telephone Encounter (Signed)
I spoke to [facility] at [facility] yesterday, the order was sent in yesterday. The Budesonide was sent in in October with 11 refills.

## 2021-11-05 NOTE — Telephone Encounter (Signed)
Jeannie from [facility] states patient needs Performist 120 ml. Also needs refill for Budesonide. Jeannie phone number is [phone].

## 2021-11-11 ENCOUNTER — Telehealth: Payer: Self-pay | Admitting: Pulmonary Disease

## 2021-11-11 ENCOUNTER — Telehealth: Payer: Self-pay | Admitting: Adult Health

## 2021-11-11 MED ORDER — FORMOTEROL FUMARATE 20 MCG/2ML IN NEBU: 20.0000 ug | INHALATION_SOLUTION | Freq: Two times a day (BID) | RESPIRATORY_TRACT | 3 refills | Status: DC

## 2021-11-11 NOTE — Telephone Encounter (Signed)
Spoke to patient, who stated that he has not received meds from [facility]. Order was placed on 10/09/2021 for Yupelri and pulmicort.  Spoke to [place] with [facility], who stated that she would research this and call back with update.

## 2021-11-11 NOTE — Telephone Encounter (Signed)
Please see duplicate phone note dates as 11/11/2021. Will close this encounter.

## 2021-11-11 NOTE — Telephone Encounter (Signed)
[facility]ist has been [facility] to [facility].  Sonia Baller with [facility] is aware. Patient aware and voiced his understanding.  Nothing further needed.

## 2021-11-11 NOTE — Telephone Encounter (Signed)
It is okay to RESEND this order it appears that we have done this previously.

## 2021-11-11 NOTE — Telephone Encounter (Signed)
Spoke to [place] with [facility] stated that insurance does not cover Brovana. Perforomist is preferred.  Patient is also taking Yupelri and pulmicort.  Dr. Patsey Berthold, please advise if okay to prescribe Perforomist? Thanks

## 2021-11-26 ENCOUNTER — Other Ambulatory Visit: Payer: Self-pay

## 2021-11-26 ENCOUNTER — Encounter: Payer: Medicare HMO | Attending: Pulmonary Disease

## 2021-11-26 ENCOUNTER — Telehealth: Payer: Self-pay

## 2021-11-26 ENCOUNTER — Other Ambulatory Visit (HOSPITAL_COMMUNITY): Payer: Self-pay

## 2021-11-26 VITALS — Ht 70.25 in | Wt 220.0 lb

## 2021-11-26 DIAGNOSIS — J449 Chronic obstructive pulmonary disease, unspecified: Secondary | ICD-10-CM | POA: Diagnosis present

## 2021-11-26 DIAGNOSIS — R0600 Dyspnea, unspecified: Secondary | ICD-10-CM | POA: Diagnosis present

## 2021-11-26 NOTE — Progress Notes (Signed)
Pulmonary Individual Treatment Plan  Patient Details  Name: Bradley Sawyer MRN: [id number] Date of Birth: May 09, 1956 Referring Provider:   Flowsheet Row Pulmonary Rehab from 11/26/2021 in [facility] Cardiac and Pulmonary Rehab  Referring Provider Vernard Gambles MD       Initial Encounter Date:  Flowsheet Row Pulmonary Rehab from 11/26/2021 in [facility] Cardiac and Pulmonary Rehab  Date 11/26/21       Visit Diagnosis: Stage 3 severe COPD by GOLD classification ([place])  Dyspnea, unspecified type  Patient's Home Medications on Admission:  Current Outpatient Medications:    albuterol (PROVENTIL) (2.5 MG/3ML) 0.083% nebulizer solution, USE 1 AMPULE IN NEBULIZER EVERY 6 HOURS AS NEEDED., Disp: 75 mL, Rfl: 0   amLODipine (NORVASC) 5 MG tablet, Take 5 mg by mouth daily., Disp: , Rfl:    aspirin EC 81 MG tablet, Take 81 mg by mouth daily., Disp: , Rfl:    atorvastatin (LIPITOR) 80 MG tablet, Take 80 mg by mouth daily., Disp: , Rfl:    budesonide (PULMICORT) 0.25 MG/2ML nebulizer solution, Take 2 mLs (0.25 mg total) by nebulization 2 (two) times daily., Disp: 120 mL, Rfl: 11   clopidogrel (PLAVIX) 75 MG tablet, Take 75 mg by mouth daily., Disp: , Rfl:    EPINEPHrine 0.3 mg/0.3 mL IJ SOAJ injection, Inject 0.3 mg into the muscle as needed for anaphylaxis., Disp: , Rfl:    FLUoxetine (PROZAC) 20 MG capsule, Take 20 mg by mouth daily., Disp: , Rfl:    formoterol (PERFOROMIST) 20 MCG/2ML nebulizer solution, Take 2 mLs (20 mcg total) by nebulization 2 (two) times daily., Disp: 120 mL, Rfl: 3   isosorbide mononitrate (IMDUR) 30 MG 24 hr tablet, Take 30 mg by mouth daily., Disp: , Rfl:    levocetirizine (XYZAL) 5 MG tablet, Take 5 mg by mouth at bedtime., Disp: , Rfl:    linaclotide (LINZESS) 290 MCG CAPS capsule, Take 290 mcg by mouth daily before breakfast., Disp: , Rfl:    lisinopril-hydrochlorothiazide (ZESTORETIC) 20-25 MG tablet, Take 1 tablet by mouth daily., Disp: , Rfl:    meloxicam (MOBIC) 15 MG  tablet, TAKE 1 TABLET IN THE MORNING WITH FOOD, Disp: , Rfl:    metFORMIN (GLUCOPHAGE) 500 MG tablet, Take 500 mg by mouth daily., Disp: , Rfl:    montelukast (SINGULAIR) 10 MG tablet, Take 10 mg by mouth daily., Disp: , Rfl:    Multiple Vitamin (MULTI-VITAMINS) TABS, Take 1 tablet by mouth daily. (Patient not taking: Reported on 10/29/2021), Disp: , Rfl:    naloxone (NARCAN) nasal spray 4 mg/0.1 mL, Place 0.4 mg into the nose once., Disp: , Rfl:    nitroGLYCERIN (NITROSTAT) 0.4 MG SL tablet, Place 0.4 mg under the tongue every 5 (five) minutes as needed for chest pain. (Patient not taking: Reported on 10/08/2021), Disp: , Rfl:    Olopatadine HCl 0.2 % SOLN, INSTILL 1 DROPS INTO EACH EYE ONCE DAILY AS NEEDED FOR ITCHY EYES., Disp: , Rfl:    oxyCODONE (ROXICODONE) 15 MG immediate release tablet, Take by mouth., Disp: , Rfl:    pantoprazole (PROTONIX) 40 MG tablet, Take 1 tablet by mouth daily. (Patient not taking: Reported on 10/29/2021), Disp: , Rfl:    polyethylene glycol (MIRALAX / GLYCOLAX) packet, Take 17 g by mouth daily as needed (for constipation)., Disp: , Rfl:    potassium chloride SA (KLOR-CON) 20 MEQ tablet, Take 1 tablet (20 mEq total) by mouth daily for 3 days., Disp: 3 tablet, Rfl: 0   predniSONE (STERAPRED UNI-PAK 21 TAB)  10 MG (21) TBPK tablet, Take as directed in the package (Patient not taking: Reported on 10/29/2021), Disp: 21 tablet, Rfl: 0   pregabalin (LYRICA) 150 MG capsule, Take 150 mg by mouth daily., Disp: , Rfl:    PROAIR HFA 108 (90 BASE) MCG/ACT inhaler, INHALE 2 PUFFS INTO THE LUNGS EVERY 6 (SIX) HOURS AS NEEDED FOR WHEEZING. (Patient taking differently: Inhale 2 puffs into the lungs every 6 (six) hours as needed for shortness of breath or wheezing.), Disp: 22.5 Inhaler, Rfl: 1   rOPINIRole (REQUIP) 1 MG tablet, Take 1 tablet by mouth at bedtime., Disp: , Rfl:    spironolactone (ALDACTONE) 25 MG tablet, Take 25 mg by mouth daily., Disp: , Rfl:    tamsulosin (FLOMAX) 0.4 MG  CAPS capsule, TAKE (1) CAPSULE BY MOUTH ONCE DAILY., Disp: 30 capsule, Rfl: 11   YUPELRI 175 MCG/3ML nebulizer solution, Inhale 3 mLs (175 mcg total) into the lungs daily., Disp: 90 mL, Rfl: 11 No current facility-administered medications for this visit.  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   Compression fracture    L2   COPD (chronic obstructive pulmonary disease) (HCC)    bronchitis   CVA (cerebral infarction)    Disorder of vocal cord    Fatty liver    GERD (gastroesophageal reflux disease)    Hx of cardiovascular stress test    ETT/Lexiscan-Myoview (10/15):  Inferior, inferoapical, apical lateral defect c/w mild ischemia, EF 66%; Intermediate Risk   Hyperlipidemia    Hypertension    Right shoulder pain    2/2 partial rotator cuff tear, tendinopathy, mild subacromial/subdeltioid bursitis, A/C joint arthropathy per MRI done in 3/07   Tobacco abuse     Tobacco Use: Social History   Tobacco Use  Smoking Status Former   Packs/day: 2.00   Years: 48.00   Pack years: 96.00   Types: Cigarettes   Quit date: 11/22/2020   Years since quitting: 1.0  Smokeless Tobacco Never    Labs: Recent Review Flowsheet Data     Labs for ITP Cardiac and Pulmonary Rehab Latest Ref Rng & Units 10/04/2014 10/15/2014 02/11/2015 02/12/2015 05/08/2016   Cholestrol 0 - 200 mg/dL - - 179 - -   LDLCALC 0 - 99 mg/dL - - NOT CALC - -   LDLDIRECT mg/dL - - - 94 -   HDL >=40 mg/dL - - 36(L) - -   Trlycerides <150 mg/dL - - 402(H) - -   Hemoglobin A1c 4.0 - 6.0 % - - - - 5.8   PHART 7.350 - 7.450 7.421 7.366 - - -   PCO2ART 35.0 - 45.0 mmHg 39.8 49.7(H) - - -   HCO3 20.0 -  24.0 mEq/L 25.4(H) 28.2(H) - - -   TCO2 0 - 100 mmol/L 26.6 30 - - -   O2SAT % 92.6 97.0 - - -        Pulmonary Assessment Scores:  Pulmonary Assessment Scores     Row Name 11/26/21 1140         ADL UCSD   ADL Phase Entry     SOB Score total 39     Rest 0  Walk 3     Stairs 5     Bath 5     Dress 0     Shop 0  Patient wrote "N/A"       CAT Score   CAT Score 29       mMRC Score   mMRC Score 4              UCSD: Self-administered rating of dyspnea associated with activities of daily living (ADLs) 6-point scale (0 = "not at all" to 5 = "maximal or unable to do because of breathlessness")  Scoring Scores range from 0 to 120.  Minimally important difference is 5 units  CAT: CAT can identify the health impairment of COPD patients and is better correlated with disease progression.  CAT has a scoring range of zero to 40. The CAT score is classified into four groups of low (less than 10), medium (10 - 20), high (21-30) and very high (31-40) based on the impact level of disease on health status. A CAT score over 10 suggests significant symptoms.  A worsening CAT score could be explained by an exacerbation, poor medication adherence, poor inhaler technique, or progression of COPD or comorbid conditions.  CAT MCID is 2 points  mMRC: mMRC (Modified Medical Research Council) Dyspnea Scale is used to assess the degree of baseline functional disability in patients of respiratory disease due to dyspnea. No minimal important difference is established. A decrease in score of 1 point or greater is considered a positive change.   Pulmonary Function Assessment:  Pulmonary Function Assessment - 11/26/21 1137       Pulmonary Function Tests   FVC% 66 %    FEV1% 29 %    FEV1/FVC Ratio 33             Exercise Target Goals: Exercise Program Goal: Individual exercise prescription set using results from initial 6 min walk test and THRR while considering  patients activity  barriers and safety.   Exercise Prescription Goal: Initial exercise prescription builds to 30-45 minutes a day of aerobic activity, 2-3 days per week.  Home exercise guidelines will be given to patient during program as part of exercise prescription that the participant will acknowledge.  Education: Aerobic Exercise: - Group verbal and visual presentation on the components of exercise prescription. Introduces F.I.T.T principle from ACSM for exercise prescriptions.  Reviews F.I.T.T. principles of aerobic exercise including progression. Written material given at graduation.   Education: Resistance Exercise: - Group verbal and visual presentation on the components of exercise prescription. Introduces F.I.T.T principle from ACSM for exercise prescriptions  Reviews F.I.T.T. principles of resistance exercise including progression. Written material given at graduation.    Education: Exercise & Equipment Safety: - Individual verbal instruction and demonstration of equipment use and safety with use of the equipment. Flowsheet Row Pulmonary Rehab from 11/26/2021 in [facility] Cardiac and Pulmonary Rehab  Education need identified 11/26/21  Date 11/26/21  Educator [place]  Instruction Review Code 1- Verbalizes Understanding       Education: Exercise Physiology & General Exercise Guidelines: - Group verbal and written instruction with models to review the exercise physiology of the cardiovascular system and associated critical values. Provides general exercise guidelines with specific guidelines to those with heart or lung disease.    Education: Flexibility, Balance, Mind/Body Relaxation: - Group verbal and visual presentation with interactive activity on the components of exercise prescription. Introduces F.I.T.T principle from ACSM for exercise prescriptions. Reviews F.I.T.T. principles of flexibility and balance exercise training  including progression. Also discusses the mind body connection.  Reviews various  relaxation techniques to help reduce and manage stress (i.e. Deep breathing, progressive muscle relaxation, and visualization). Balance handout provided to take home. Written material given at graduation.   Activity Barriers & Risk Stratification:  Activity Barriers & Cardiac Risk Stratification - 11/26/21 1420       Activity Barriers & Cardiac Risk Stratification   Activity Barriers Back Problems;Joint Problems;Shortness of Breath;Deconditioning;Muscular [employer] Device;Other (comment)    Comments Chronic pain all over- being managed by pain clinic             6 Minute Walk:  6 Minute Walk     Row Name 11/26/21 1156         6 Minute Walk   Phase Initial     Distance 1000 feet     Walk Time 6 minutes     # of Rest Breaks 0     MPH 1.89     METS 2.24     RPE 15     Perceived Dyspnea  3     VO2 Peak 7.84     Symptoms Yes (comment)     Comments SOB, buttock pain 7/10     Resting HR 76 bpm     Resting BP 90/58     Resting Oxygen Saturation  94 %     Exercise Oxygen Saturation  during 6 min walk 93 %     Max Ex. HR 104 bpm     Max Ex. BP 98/56     2 Minute Post BP 94/52       Interval HR   1 Minute HR 92     2 Minute HR 96     3 Minute HR 103     4 Minute HR 99     5 Minute HR 96     6 Minute HR 104     2 Minute Post HR 83     Interval Heart Rate? Yes       Interval Oxygen   Interval Oxygen? Yes     Baseline Oxygen Saturation % 94 %     1 Minute Oxygen Saturation % 94 %     1 Minute Liters of Oxygen 3 L     2 Minute Oxygen Saturation % 93 %     2 Minute Liters of Oxygen 3 L     3 Minute Oxygen Saturation % 95 %     3 Minute Liters of Oxygen 3 L     4 Minute Oxygen Saturation % 94 %     4 Minute Liters of Oxygen 3 L     5 Minute Oxygen Saturation % 95 %     5 Minute Liters of Oxygen 3 L     6 Minute Oxygen Saturation % 96 %     6 Minute Liters of Oxygen 3 L     2 Minute Post Oxygen Saturation % 95 %     2 Minute Post Liters of Oxygen 3 L              Oxygen Initial Assessment:  Oxygen Initial Assessment - 11/26/21 1425       Home Oxygen   Home Oxygen Device Home Concentrator;Portable Concentrator;E-Tanks    Sleep Oxygen Prescription Continuous    Liters per minute 3    Home Exercise Oxygen Prescription Continuous    Liters per minute 3    Home Resting Oxygen Prescription Continuous  Liters per minute 3    Compliance with Home Oxygen Use Yes      Initial 6 min Walk   Oxygen Used Continuous    Liters per minute 3   could use 4 if needed     Intervention   Short Term Goals To learn and exhibit compliance with exercise, home and travel O2 prescription;To learn and understand importance of monitoring SPO2 with pulse oximeter and demonstrate accurate use of the pulse oximeter.;To learn and understand importance of maintaining oxygen saturations>88%;To learn and demonstrate proper pursed lip breathing techniques or other breathing techniques. ;To learn and demonstrate proper use of respiratory medications    Long  Term Goals Exhibits compliance with exercise, home  and travel O2 prescription;Verbalizes importance of monitoring SPO2 with pulse oximeter and return demonstration;Maintenance of O2 saturations>88%;Exhibits proper breathing techniques, such as pursed lip breathing or other method taught during program session;Compliance with respiratory medication;Demonstrates proper use of MDIs             Oxygen Re-Evaluation:   Oxygen Discharge (Final Oxygen Re-Evaluation):   Initial Exercise Prescription:  Initial Exercise Prescription - 11/26/21 1400       Date of Initial Exercise RX and Referring Provider   Date 11/26/21    Referring Provider Vernard Gambles MD      Oxygen   Oxygen Continuous    Liters 4   Ranges 3-4   Maintain Oxygen Saturation 88% or higher      REL-XR   Level 1    Speed 50    Minutes 15    METs 2.2      Biostep-RELP   Level 1    SPM 50    Minutes 15    METs 2.2       Track   Laps 18    Minutes 15    METs 1.98      Prescription Details   Frequency (times per week) 2    Duration Progress to 30 minutes of continuous aerobic without signs/symptoms of physical distress      Intensity   THRR 40-80% of Max Heartrate 107-139    Ratings of Perceived Exertion 11-13    Perceived Dyspnea 0-4      Progression   Progression Continue to progress workloads to maintain intensity without signs/symptoms of physical distress.      Resistance Training   Training Prescription Yes    Weight 3 lb    Reps 10-15             Perform Capillary Blood Glucose checks as needed.  Exercise Prescription Changes:   Exercise Prescription Changes     Row Name 11/26/21 1400             Response to Exercise   Blood Pressure (Admit) 90/58       Blood Pressure (Exercise) 98/56       Blood Pressure (Exit) 94/52       Heart Rate (Admit) 76 bpm       Heart Rate (Exercise) 104 bpm       Heart Rate (Exit) 83 bpm       Oxygen Saturation (Admit) 94 %       Oxygen Saturation (Exercise) 93 %       Oxygen Saturation (Exit) 95 %       Rating of Perceived Exertion (Exercise) 15       Perceived Dyspnea (Exercise) 3       Symptoms SOB, buttock pain 7/10  Comments walk test results                Exercise Comments:   Exercise Goals and Review:   Exercise Goals     Row Name 11/26/21 1423             Exercise Goals   Increase Physical Activity Yes       Intervention Provide advice, education, support and counseling about physical activity/exercise needs.;Develop an individualized exercise prescription for aerobic and resistive training based on initial evaluation findings, risk stratification, comorbidities and participant's personal goals.       Expected Outcomes Short Term: Attend rehab on a regular basis to increase amount of physical activity.;Long Term: Add in home exercise to make exercise part of routine and to increase amount of physical  activity.;Long Term: Exercising regularly at least 3-5 days a week.       Increase Strength and Stamina Yes       Intervention Provide advice, education, support and counseling about physical activity/exercise needs.;Develop an individualized exercise prescription for aerobic and resistive training based on initial evaluation findings, risk stratification, comorbidities and participant's personal goals.       Expected Outcomes Short Term: Perform resistance training exercises routinely during rehab and add in resistance training at home;Short Term: Increase workloads from initial exercise prescription for resistance, speed, and METs.;Long Term: Improve cardiorespiratory fitness, muscular endurance and strength as measured by increased METs and functional capacity (6MWT)       Able to understand and use rate of perceived exertion (RPE) scale Yes       Intervention Provide education and explanation on how to use RPE scale       Expected Outcomes Short Term: Able to use RPE daily in rehab to express subjective intensity level;Long Term:  Able to use RPE to guide intensity level when exercising independently       Able to understand and use Dyspnea scale Yes       Intervention Provide education and explanation on how to use Dyspnea scale       Expected Outcomes Short Term: Able to use Dyspnea scale daily in rehab to express subjective sense of shortness of breath during exertion;Long Term: Able to use Dyspnea scale to guide intensity level when exercising independently       Knowledge and understanding of Target Heart Rate Range (THRR) Yes       Intervention Provide education and explanation of THRR including how the numbers were predicted and where they are located for reference       Expected Outcomes Short Term: Able to state/look up THRR;Long Term: Able to use THRR to govern intensity when exercising independently;Short Term: Able to use daily as guideline for intensity in rehab       Able to check  pulse independently Yes       Intervention Provide education and demonstration on how to check pulse in carotid and radial arteries.;Review the importance of being able to check your own pulse for safety during independent exercise       Expected Outcomes Short Term: Able to explain why pulse checking is important during independent exercise;Long Term: Able to check pulse independently and accurately       Understanding of Exercise Prescription Yes       Intervention Provide education, explanation, and written materials on patient's individual exercise prescription       Expected Outcomes Short Term: Able to explain program exercise prescription;Long Term: Able to explain home exercise prescription  to exercise independently                Exercise Goals Re-Evaluation :   Discharge Exercise Prescription (Final Exercise Prescription Changes):  Exercise Prescription Changes - 11/26/21 1400       Response to Exercise   Blood Pressure (Admit) 90/58    Blood Pressure (Exercise) 98/56    Blood Pressure (Exit) 94/52    Heart Rate (Admit) 76 bpm    Heart Rate (Exercise) 104 bpm    Heart Rate (Exit) 83 bpm    Oxygen Saturation (Admit) 94 %    Oxygen Saturation (Exercise) 93 %    Oxygen Saturation (Exit) 95 %    Rating of Perceived Exertion (Exercise) 15    Perceived Dyspnea (Exercise) 3    Symptoms SOB, buttock pain 7/10    Comments walk test results             Nutrition:  Target Goals: Understanding of nutrition guidelines, daily intake of sodium 1500mg , cholesterol 200mg , calories 30% from fat and 7% or less from saturated fats, daily to have 5 or more servings of fruits and vegetables.  Education: All About Nutrition: -Group instruction provided by verbal, written material, interactive activities, discussions, models, and posters to present general guidelines for heart healthy nutrition including fat, fiber, MyPlate, the role of sodium in heart healthy nutrition, utilization  of the nutrition label, and utilization of this knowledge for meal planning. Follow up email sent as well. Written material given at graduation.   Biometrics:  Pre Biometrics - 11/26/21 1424       Pre Biometrics   Height 5' 10.25" (1.784 m)    Weight 220 lb (99.8 kg)    BMI (Calculated) 31.35              Nutrition Therapy Plan and Nutrition Goals:  Nutrition Therapy & Goals - 11/26/21 1426       Intervention Plan   Intervention Prescribe, educate and counsel regarding individualized specific dietary modifications aiming towards targeted core components such as weight, hypertension, lipid management, diabetes, heart failure and other comorbidities.    Expected Outcomes Short Term Goal: Understand basic principles of dietary content, such as calories, fat, sodium, cholesterol and nutrients.;Short Term Goal: A plan has been developed with personal nutrition goals set during dietitian appointment.;Long Term Goal: Adherence to prescribed nutrition plan.             Nutrition Assessments:  MEDIFICTS Score Key: ?70 Need to make dietary changes  40-70 Heart Healthy Diet ? 40 Therapeutic Level Cholesterol Diet  Flowsheet Row Pulmonary Rehab from 11/26/2021 in [facility] Cardiac and Pulmonary Rehab  Picture Your Plate Total Score on Admission 40      Picture Your Plate Scores: <76 Unhealthy dietary pattern with much room for improvement. 41-50 Dietary pattern unlikely to meet recommendations for good health and room for improvement. 51-60 More healthful dietary pattern, with some room for improvement.  >60 Healthy dietary pattern, although there may be some specific behaviors that could be improved.   Nutrition Goals Re-Evaluation:   Nutrition Goals Discharge (Final Nutrition Goals Re-Evaluation):   Psychosocial: Target Goals: Acknowledge presence or absence of significant depression and/or stress, maximize coping skills, provide positive support system. Participant is able  to verbalize types and ability to use techniques and skills needed for reducing stress and depression.   Education: Stress, Anxiety, and Depression - Group verbal and visual presentation to define topics covered.  Reviews how body is impacted by stress, anxiety,  and depression.  Also discusses healthy ways to reduce stress and to treat/manage anxiety and depression.  Written material given at graduation.   Education: Sleep Hygiene -Provides group verbal and written instruction about how sleep can affect your health.  Define sleep hygiene, discuss sleep cycles and impact of sleep habits. Review good sleep hygiene tips.    Initial Review & Psychosocial Screening:  Initial Psych Review & Screening - 10/29/21 0933       Initial Review   Current issues with Current Psychotropic Meds;Current Depression;Current Anxiety/Panic;Current Stress Concerns   Anxiety manifests-mind racing, can't sit still   Source of Stress Concerns Chronic Illness;Family;Financial;Unable to participate in former interests or hobbies;Unable to perform yard/household activities    Comments Lives with son and his family son daughter in law and their 4 children      [place]? Yes   Son and his family     Barriers   Psychosocial barriers to participate in program The patient should benefit from training in stress management and relaxation.      Screening Interventions   Interventions Encouraged to exercise;To provide support and resources with identified psychosocial needs;Provide feedback about the scores to participant    Expected Outcomes Short Term goal: Utilizing psychosocial counselor, staff and physician to assist with identification of specific Stressors or current issues interfering with healing process. Setting desired goal for each stressor or current issue identified.;Long Term Goal: Stressors or current issues are controlled or eliminated.;Short Term goal: Identification and review  with participant of any Quality of Life or Depression concerns found by scoring the questionnaire.;Long Term goal: The participant improves quality of Life and PHQ9 Scores as seen by post scores and/or verbalization of changes             Quality of Life Scores:  Scores of 19 and below usually indicate a poorer quality of life in these areas.  A difference of  2-3 points is a clinically meaningful difference.  A difference of 2-3 points in the total score of the Quality of Life Index has been associated with significant improvement in overall quality of life, self-image, physical symptoms, and general health in studies assessing change in quality of life.  PHQ-9: Recent Review Flowsheet Data     Depression screen [facility] 2/9 11/26/2021 03/19/2015 03/14/2015 02/11/2015 12/23/2014   Decreased Interest 1 0 0  0 0   Down, Depressed, Hopeless 2 0 0 0  0   PHQ - 2 Score 3 0 0 0 0   Altered sleeping 3 - - - -   Tired, decreased energy 2 - - - -   Change in appetite 0 - - - -   Feeling bad or failure about yourself  0 - - - -   Trouble concentrating 0 - - - -   Moving slowly or fidgety/restless 0 - - - -   Suicidal thoughts 0 - - - -   PHQ-9 Score 8 - - - -   Difficult doing work/chores Somewhat difficult - - - -      Interpretation of Total Score  Total Score Depression Severity:  1-4 = Minimal depression, 5-9 = Mild depression, 10-14 = Moderate depression, 15-19 = Moderately severe depression, 20-27 = Severe depression   Psychosocial Evaluation and Intervention:  Psychosocial Evaluation - 10/29/21 0950       Psychosocial Evaluation & Interventions   Interventions Encouraged to exercise with the program and follow exercise prescription;Stress management education  Comments Sam is ready to try the program. He  lives in [facility] and it is a long drive for him. We discussed that 2 days a week is required to benefit from the program. He does want to see if he can attend. He has chronic  pain from several sources, back plates and screws, hip pain, old shoulder injury. He does take pain medicaine on a daily basis to help him through the day. He lives with his son and his son's family (wife and 4 children ages 21-16) He does have a cat in the house.  He speaks that he does have stress. Not being able to do what he used to do. Being dependent on pain medication. His son and family are his support system.    Expected Outcomes STG: Sam will be able to attend all scheduled sessions. He will learn and utilize Stress stragies LTG Sam will continue with his exercise progression and his Stress management    Continue Psychosocial Services  Follow up required by staff             Psychosocial Re-Evaluation:   Psychosocial Discharge (Final Psychosocial Re-Evaluation):   Education: Education Goals: Education classes will be provided on a weekly basis, covering required topics. Participant will state understanding/return demonstration of topics presented.  Learning Barriers/Preferences:   General Pulmonary Education Topics:  Infection Prevention: - Provides verbal and written material to individual with discussion of infection control including proper hand washing and proper equipment cleaning during exercise session. Flowsheet Row Pulmonary Rehab from 11/26/2021 in [facility] Cardiac and Pulmonary Rehab  Education need identified 11/26/21  Date 11/26/21  Educator [place]  Instruction Review Code 1- Verbalizes Understanding       Falls Prevention: - Provides verbal and written material to individual with discussion of falls prevention and safety. Flowsheet Row Pulmonary Rehab from 11/26/2021 in [facility] Cardiac and Pulmonary Rehab  Education need identified 11/26/21  Date 11/26/21  Educator [place]  Instruction Review Code 1- Verbalizes Understanding       Chronic Lung Disease Review: - Group verbal instruction with posters, models, PowerPoint presentations and videos,  to review new updates,  new respiratory medications, new advancements in procedures and treatments. Providing information on websites and "800" numbers for continued self-education. Includes information about supplement oxygen, available portable oxygen systems, continuous and intermittent flow rates, oxygen safety, concentrators, and Medicare reimbursement for oxygen. Explanation of Pulmonary Drugs, including class, frequency, complications, importance of spacers, rinsing mouth after steroid MDI's, and proper cleaning methods for nebulizers. Review of basic lung anatomy and physiology related to function, structure, and complications of lung disease. Review of risk factors. Discussion about methods for diagnosing sleep apnea and types of masks and machines for OSA. Includes a review of the use of types of environmental controls: home humidity, furnaces, filters, dust mite/pet prevention, HEPA vacuums. Discussion about weather changes, air quality and the benefits of nasal washing. Instruction on Warning signs, infection symptoms, calling MD promptly, preventive modes, and value of vaccinations. Review of effective airway clearance, coughing and/or vibration techniques. Emphasizing that all should Create an Action Plan. Written material given at graduation. Flowsheet Row Pulmonary Rehab from 11/26/2021 in [facility] Cardiac and Pulmonary Rehab  Education need identified 11/26/21       AED/CPR: - Group verbal and written instruction with the use of models to demonstrate the basic use of the AED with the basic ABC's of resuscitation.    Anatomy and Cardiac Procedures: - Group verbal and visual presentation and models  provide information about basic cardiac anatomy and function. Reviews the testing methods done to diagnose heart disease and the outcomes of the test results. Describes the treatment choices: Medical Management, Angioplasty, or Coronary Bypass Surgery for treating various heart conditions including Myocardial Infarction,  Angina, Valve Disease, and Cardiac Arrhythmias.  Written material given at graduation. Flowsheet Row Pulmonary Rehab from 11/26/2021 in [facility] Cardiac and Pulmonary Rehab  Education need identified 11/26/21       Medication Safety: - Group verbal and visual instruction to review commonly prescribed medications for heart and lung disease. Reviews the medication, class of the drug, and side effects. Includes the steps to properly store meds and maintain the prescription regimen.  Written material given at graduation.   Other: -Provides group and verbal instruction on various topics (see comments)   Knowledge Questionnaire Score:  Knowledge Questionnaire Score - 11/26/21 1425       Knowledge Questionnaire Score   Pre Score 15/18: A&P, Oxygen, ADL              Core Components/Risk Factors/Patient Goals at Admission:  Personal Goals and Risk Factors at Admission - 11/26/21 1424       Core Components/Risk Factors/Patient Goals on Admission    Weight Management Yes;Weight Loss    Intervention Weight Management: Develop a combined nutrition and exercise program designed to reach desired caloric intake, while maintaining appropriate intake of nutrient and fiber, sodium and fats, and appropriate energy expenditure required for the weight goal.    Admit Weight 220 lb (99.8 kg)    Goal Weight: Short Term 215 lb (97.5 kg)    Goal Weight: Long Term 210 lb (95.3 kg)    Expected Outcomes Short Term: Continue to assess and modify interventions until short term weight is achieved;Long Term: Adherence to nutrition and physical activity/exercise program aimed toward attainment of established weight goal;Weight Loss: Understanding of general recommendations for a balanced deficit meal plan, which promotes 1-2 lb weight loss per week and includes a negative energy balance of [phone] kcal/d;Understanding recommendations for meals to include 15-35% energy as protein, 25-35% energy from fat, 35-60% energy  from carbohydrates, less than 200mg  of dietary cholesterol, 20-35 gm of total fiber daily;Understanding of distribution of calorie intake throughout the day with the consumption of 4-5 meals/snacks    Improve shortness of breath with ADL's Yes    Intervention Provide education, individualized exercise plan and daily activity instruction to help decrease symptoms of SOB with activities of daily living.    Expected Outcomes Short Term: Improve cardiorespiratory fitness to achieve a reduction of symptoms when performing ADLs;Long Term: Be able to perform more ADLs without symptoms or delay the onset of symptoms    Increase knowledge of respiratory medications and ability to use respiratory devices properly  Yes    Intervention Provide education and demonstration as needed of appropriate use of medications, inhalers, and oxygen therapy.    Expected Outcomes Short Term: Achieves understanding of medications use. Understands that oxygen is a medication prescribed by physician. Demonstrates appropriate use of inhaler and oxygen therapy.;Long Term: Maintain appropriate use of medications, inhalers, and oxygen therapy.    Hypertension Yes    Intervention Provide education on lifestyle modifcations including regular physical activity/exercise, weight management, moderate sodium restriction and increased consumption of fresh fruit, vegetables, and low fat dairy, alcohol moderation, and smoking cessation.;Monitor prescription use compliance.    Expected Outcomes Short Term: Continued assessment and intervention until BP is < 140/64mm HG in hypertensive participants. < 130/29mm HG  in hypertensive participants with diabetes, heart failure or chronic kidney disease.;Long Term: Maintenance of blood pressure at goal levels.    Lipids Yes    Intervention Provide education and support for participant on nutrition & aerobic/resistive exercise along with prescribed medications to achieve LDL 70mg , HDL >40mg .    Expected  Outcomes Short Term: Participant states understanding of desired cholesterol values and is compliant with medications prescribed. Participant is following exercise prescription and nutrition guidelines.;Long Term: Cholesterol controlled with medications as prescribed, with individualized exercise RX and with personalized nutrition plan. Value goals: LDL < 70mg , HDL > 40 mg.    Stress Yes    Intervention Offer individual and/or small group education and counseling on adjustment to heart disease, stress management and health-related lifestyle change. Teach and support self-help strategies.;Refer participants experiencing significant psychosocial distress to appropriate mental health specialists for further evaluation and treatment. When possible, include family members and significant others in education/counseling sessions.    Expected Outcomes Short Term: Participant demonstrates changes in health-related behavior, relaxation and other stress management skills, ability to obtain effective social support, and compliance with psychotropic medications if prescribed.;Long Term: Emotional wellbeing is indicated by absence of clinically significant psychosocial distress or social isolation.             Education:Diabetes - Individual verbal and written instruction to review signs/symptoms of diabetes, desired ranges of glucose level fasting, after meals and with exercise. Acknowledge that pre and post exercise glucose checks will be done for 3 sessions at entry of program.   Know Your Numbers and Heart Failure: - Group verbal and visual instruction to discuss disease risk factors for cardiac and pulmonary disease and treatment options.  Reviews associated critical values for Overweight/Obesity, Hypertension, Cholesterol, and Diabetes.  Discusses basics of heart failure: signs/symptoms and treatments.  Introduces Heart Failure Zone chart for action plan for heart failure.  Written material given at  graduation.   Core Components/Risk Factors/Patient Goals Review:    Core Components/Risk Factors/Patient Goals at Discharge (Final Review):    ITP Comments:  ITP Comments     Row Name 10/29/21 0941 10/29/21 1002 11/26/21 1133       ITP Comments Virtual orientation call completed today.  He has an appointment on Date: 11/26/2021  for EP eval and gym Orientation.  Documentation of diagnosis can be found in [facility] Date: 10/08/2021 . Sam will bring his medication s to confirm he is on all on his [facility] Completed 6MWT and gym orientation. Initial ITP created and sent for review to Dr. Ottie Glazier, Medical Director.              Comments: Initial ITP  Sam's initial BP upon arriving to rehab was 76/54. Patient was asymptomatic but had not drank any water that morning and took his medications. Patient drank about 14 oz of water and BP increased to 90/58. His ending BP before leaving was 94/52. Patient still felt good and denied any dizziness or lightheadedness. Low BP was noted in recent doctor office appointment. Discussed with patient the education on staying hydrated to ensure his BP does not get too low. I encouraged him to keep a log of BP at home and talk with his doctor if he is trending on the lower end then they may need to adjust his medications as a possibility.  Patient also stated he has chronic pain all over his body. We discussed to monitor how he feels on the exercise machines and to let us know if anything  feels worse or bothers his joints. He is aware it will be a slow progression as he is deconditioned and has not exercised in a long time. Patient understood.

## 2021-11-26 NOTE — Patient Instructions (Signed)
Patient Instructions  Patient Details  Name: Bradley Sawyer MRN: [id number] Date of Birth: 1956-01-18 Referring Provider:  Tyler Pita, MD  Below are your personal goals for exercise, nutrition, and risk factors. Our goal is to help you stay on track towards obtaining and maintaining these goals. We will be discussing your progress on these goals with you throughout the program.  Initial Exercise Prescription:  Initial Exercise Prescription - 11/26/21 1400       Date of Initial Exercise RX and Referring Provider   Date 11/26/21    Referring Provider Vernard Gambles MD      Oxygen   Oxygen Continuous    Liters 4   Ranges 3-4   Maintain Oxygen Saturation 88% or higher      REL-XR   Level 1    Speed 50    Minutes 15    METs 2.2      Biostep-RELP   Level 1    SPM 50    Minutes 15    METs 2.2      Track   Laps 18    Minutes 15    METs 1.98      Prescription Details   Frequency (times per week) 2    Duration Progress to 30 minutes of continuous aerobic without signs/symptoms of physical distress      Intensity   THRR 40-80% of Max Heartrate 107-139    Ratings of Perceived Exertion 11-13    Perceived Dyspnea 0-4      Progression   Progression Continue to progress workloads to maintain intensity without signs/symptoms of physical distress.      Resistance Training   Training Prescription Yes    Weight 3 lb    Reps 10-15             Exercise Goals: Frequency: Be able to perform aerobic exercise two to three times per week in program working toward 2-5 days per week of home exercise.  Intensity: Work with a perceived exertion of 11 (fairly light) - 15 (hard) while following your exercise prescription.  We will make changes to your prescription with you as you progress through the program.   Duration: Be able to do 30 to 45 minutes of continuous aerobic exercise in addition to a 5 minute warm-up and a 5 minute cool-down routine.   Nutrition  Goals: Your personal nutrition goals will be established when you do your nutrition analysis with the dietician.  The following are general nutrition guidelines to follow: Cholesterol < 200mg /day Sodium < 1500mg /day Fiber: Men over 50 yrs - 30 grams per day  Personal Goals:  Personal Goals and Risk Factors at Admission - 11/26/21 1424       Core Components/Risk Factors/Patient Goals on Admission    Weight Management Yes;Weight Loss    Intervention Weight Management: Develop a combined nutrition and exercise program designed to reach desired caloric intake, while maintaining appropriate intake of nutrient and fiber, sodium and fats, and appropriate energy expenditure required for the weight goal.    Admit Weight 220 lb (99.8 kg)    Goal Weight: Short Term 215 lb (97.5 kg)    Goal Weight: Long Term 210 lb (95.3 kg)    Expected Outcomes Short Term: Continue to assess and modify interventions until short term weight is achieved;Long Term: Adherence to nutrition and physical activity/exercise program aimed toward attainment of established weight goal;Weight Loss: Understanding of general recommendations for a balanced deficit meal plan, which promotes 1-2 lb weight  loss per week and includes a negative energy balance of [phone] kcal/d;Understanding recommendations for meals to include 15-35% energy as protein, 25-35% energy from fat, 35-60% energy from carbohydrates, less than 200mg  of dietary cholesterol, 20-35 gm of total fiber daily;Understanding of distribution of calorie intake throughout the day with the consumption of 4-5 meals/snacks    Improve shortness of breath with ADL's Yes    Intervention Provide education, individualized exercise plan and daily activity instruction to help decrease symptoms of SOB with activities of daily living.    Expected Outcomes Short Term: Improve cardiorespiratory fitness to achieve a reduction of symptoms when performing ADLs;Long Term: Be able to perform more  ADLs without symptoms or delay the onset of symptoms    Increase knowledge of respiratory medications and ability to use respiratory devices properly  Yes    Intervention Provide education and demonstration as needed of appropriate use of medications, inhalers, and oxygen therapy.    Expected Outcomes Short Term: Achieves understanding of medications use. Understands that oxygen is a medication prescribed by physician. Demonstrates appropriate use of inhaler and oxygen therapy.;Long Term: Maintain appropriate use of medications, inhalers, and oxygen therapy.    Hypertension Yes    Intervention Provide education on lifestyle modifcations including regular physical activity/exercise, weight management, moderate sodium restriction and increased consumption of fresh fruit, vegetables, and low fat dairy, alcohol moderation, and smoking cessation.;Monitor prescription use compliance.    Expected Outcomes Short Term: Continued assessment and intervention until BP is < 140/72mm HG in hypertensive participants. < 130/32mm HG in hypertensive participants with diabetes, heart failure or chronic kidney disease.;Long Term: Maintenance of blood pressure at goal levels.    Lipids Yes    Intervention Provide education and support for participant on nutrition & aerobic/resistive exercise along with prescribed medications to achieve LDL 70mg , HDL >40mg .    Expected Outcomes Short Term: Participant states understanding of desired cholesterol values and is compliant with medications prescribed. Participant is following exercise prescription and nutrition guidelines.;Long Term: Cholesterol controlled with medications as prescribed, with individualized exercise RX and with personalized nutrition plan. Value goals: LDL < 70mg , HDL > 40 mg.    Stress Yes    Intervention Offer individual and/or small group education and counseling on adjustment to heart disease, stress management and health-related lifestyle change. Teach and  support self-help strategies.;Refer participants experiencing significant psychosocial distress to appropriate mental health specialists for further evaluation and treatment. When possible, include family members and significant others in education/counseling sessions.    Expected Outcomes Short Term: Participant demonstrates changes in health-related behavior, relaxation and other stress management skills, ability to obtain effective social support, and compliance with psychotropic medications if prescribed.;Long Term: Emotional wellbeing is indicated by absence of clinically significant psychosocial distress or social isolation.             Tobacco Use Initial Evaluation: Social History   Tobacco Use  Smoking Status Former   Packs/day: 2.00   Years: 48.00   Pack years: 96.00   Types: Cigarettes   Quit date: 11/22/2020   Years since quitting: 1.0  Smokeless Tobacco Never    Exercise Goals and Review:  Exercise Goals     Row Name 11/26/21 1423             Exercise Goals   Increase Physical Activity Yes       Intervention Provide advice, education, support and counseling about physical activity/exercise needs.;Develop an individualized exercise prescription for aerobic and resistive training based on initial evaluation findings,  risk stratification, comorbidities and participant's personal goals.       Expected Outcomes Short Term: Attend rehab on a regular basis to increase amount of physical activity.;Long Term: Add in home exercise to make exercise part of routine and to increase amount of physical activity.;Long Term: Exercising regularly at least 3-5 days a week.       Increase Strength and Stamina Yes       Intervention Provide advice, education, support and counseling about physical activity/exercise needs.;Develop an individualized exercise prescription for aerobic and resistive training based on initial evaluation findings, risk stratification, comorbidities and  participant's personal goals.       Expected Outcomes Short Term: Perform resistance training exercises routinely during rehab and add in resistance training at home;Short Term: Increase workloads from initial exercise prescription for resistance, speed, and METs.;Long Term: Improve cardiorespiratory fitness, muscular endurance and strength as measured by increased METs and functional capacity (6MWT)       Able to understand and use rate of perceived exertion (RPE) scale Yes       Intervention Provide education and explanation on how to use RPE scale       Expected Outcomes Short Term: Able to use RPE daily in rehab to express subjective intensity level;Long Term:  Able to use RPE to guide intensity level when exercising independently       Able to understand and use Dyspnea scale Yes       Intervention Provide education and explanation on how to use Dyspnea scale       Expected Outcomes Short Term: Able to use Dyspnea scale daily in rehab to express subjective sense of shortness of breath during exertion;Long Term: Able to use Dyspnea scale to guide intensity level when exercising independently       Knowledge and understanding of Target Heart Rate Range (THRR) Yes       Intervention Provide education and explanation of THRR including how the numbers were predicted and where they are located for reference       Expected Outcomes Short Term: Able to state/look up THRR;Long Term: Able to use THRR to govern intensity when exercising independently;Short Term: Able to use daily as guideline for intensity in rehab       Able to check pulse independently Yes       Intervention Provide education and demonstration on how to check pulse in carotid and radial arteries.;Review the importance of being able to check your own pulse for safety during independent exercise       Expected Outcomes Short Term: Able to explain why pulse checking is important during independent exercise;Long Term: Able to check pulse  independently and accurately       Understanding of Exercise Prescription Yes       Intervention Provide education, explanation, and written materials on patient's individual exercise prescription       Expected Outcomes Short Term: Able to explain program exercise prescription;Long Term: Able to explain home exercise prescription to exercise independently                Copy of goals given to participant.

## 2021-11-26 NOTE — Telephone Encounter (Signed)
Patient Advocate Encounter   Received notification from [facility] that prior authorization for Garlon Hatchet is required by his/her insurance [employer].   PA submitted on 11/26/21  Key# [id number]  Status is pending    [facility] will continue to follow:  Patient Advocate Fax:  [phone]

## 2021-11-27 NOTE — Telephone Encounter (Signed)
Called and spoke with pt letting him know the info about the prior auth and pt verbalized understanding. Pt said that he currently was able to receive all of his neb sol. Nothing further needed.

## 2021-11-27 NOTE — Telephone Encounter (Signed)
Received a fax regarding Prior Authorization from [place] for [employer] NEB SOL. Authorization has been DENIED because IT IS COVERED UNDER MEDICARE PART B..   Received notification from [facility]) regarding a prior authorization for [employer] NEB SOLUTION. Authorization has been APPROVED from 1.5.23 to 12.31.23 UNDER PART B.    Authorization # PA Case ID: [id number]

## 2021-12-16 ENCOUNTER — Encounter: Payer: Self-pay | Admitting: *Deleted

## 2021-12-16 DIAGNOSIS — R0600 Dyspnea, unspecified: Secondary | ICD-10-CM

## 2021-12-16 DIAGNOSIS — J449 Chronic obstructive pulmonary disease, unspecified: Secondary | ICD-10-CM

## 2021-12-16 NOTE — Progress Notes (Signed)
Pulmonary Individual Treatment Plan  Patient Details  Name: Bradley Sawyer MRN: [id number] Date of Birth: 02-08-1956 Referring Provider:   Flowsheet Row Pulmonary Rehab from 11/26/2021 in [facility] Cardiac and Pulmonary Rehab  Referring Provider Vernard Gambles MD       Initial Encounter Date:  Flowsheet Row Pulmonary Rehab from 11/26/2021 in [facility] Cardiac and Pulmonary Rehab  Date 11/26/21       Visit Diagnosis: Stage 3 severe COPD by GOLD classification ([place])  Dyspnea, unspecified type  Patient's Home Medications on Admission:  Current Outpatient Medications:    albuterol (PROVENTIL) (2.5 MG/3ML) 0.083% nebulizer solution, USE 1 AMPULE IN NEBULIZER EVERY 6 HOURS AS NEEDED., Disp: 75 mL, Rfl: 0   amLODipine (NORVASC) 5 MG tablet, Take 5 mg by mouth daily., Disp: , Rfl:    aspirin EC 81 MG tablet, Take 81 mg by mouth daily., Disp: , Rfl:    atorvastatin (LIPITOR) 80 MG tablet, Take 80 mg by mouth daily., Disp: , Rfl:    budesonide (PULMICORT) 0.25 MG/2ML nebulizer solution, Take 2 mLs (0.25 mg total) by nebulization 2 (two) times daily., Disp: 120 mL, Rfl: 11   clopidogrel (PLAVIX) 75 MG tablet, Take 75 mg by mouth daily., Disp: , Rfl:    EPINEPHrine 0.3 mg/0.3 mL IJ SOAJ injection, Inject 0.3 mg into the muscle as needed for anaphylaxis., Disp: , Rfl:    FLUoxetine (PROZAC) 20 MG capsule, Take 20 mg by mouth daily., Disp: , Rfl:    formoterol (PERFOROMIST) 20 MCG/2ML nebulizer solution, Take 2 mLs (20 mcg total) by nebulization 2 (two) times daily., Disp: 120 mL, Rfl: 3   isosorbide mononitrate (IMDUR) 30 MG 24 hr tablet, Take 30 mg by mouth daily., Disp: , Rfl:    levocetirizine (XYZAL) 5 MG tablet, Take 5 mg by mouth at bedtime., Disp: , Rfl:    linaclotide (LINZESS) 290 MCG CAPS capsule, Take 290 mcg by mouth daily before breakfast., Disp: , Rfl:    lisinopril-hydrochlorothiazide (ZESTORETIC) 20-25 MG tablet, Take 1 tablet by mouth daily., Disp: , Rfl:    meloxicam (MOBIC) 15 MG  tablet, TAKE 1 TABLET IN THE MORNING WITH FOOD, Disp: , Rfl:    metFORMIN (GLUCOPHAGE) 500 MG tablet, Take 500 mg by mouth daily., Disp: , Rfl:    montelukast (SINGULAIR) 10 MG tablet, Take 10 mg by mouth daily., Disp: , Rfl:    Multiple Vitamin (MULTI-VITAMINS) TABS, Take 1 tablet by mouth daily. (Patient not taking: Reported on 10/29/2021), Disp: , Rfl:    naloxone (NARCAN) nasal spray 4 mg/0.1 mL, Place 0.4 mg into the nose once., Disp: , Rfl:    nitroGLYCERIN (NITROSTAT) 0.4 MG SL tablet, Place 0.4 mg under the tongue every 5 (five) minutes as needed for chest pain. (Patient not taking: Reported on 10/08/2021), Disp: , Rfl:    Olopatadine HCl 0.2 % SOLN, INSTILL 1 DROPS INTO EACH EYE ONCE DAILY AS NEEDED FOR ITCHY EYES., Disp: , Rfl:    oxyCODONE (ROXICODONE) 15 MG immediate release tablet, Take by mouth., Disp: , Rfl:    pantoprazole (PROTONIX) 40 MG tablet, Take 1 tablet by mouth daily. (Patient not taking: Reported on 10/29/2021), Disp: , Rfl:    polyethylene glycol (MIRALAX / GLYCOLAX) packet, Take 17 g by mouth daily as needed (for constipation)., Disp: , Rfl:    potassium chloride SA (KLOR-CON) 20 MEQ tablet, Take 1 tablet (20 mEq total) by mouth daily for 3 days., Disp: 3 tablet, Rfl: 0   predniSONE (STERAPRED UNI-PAK 21 TAB)  10 MG (21) TBPK tablet, Take as directed in the package (Patient not taking: Reported on 10/29/2021), Disp: 21 tablet, Rfl: 0   pregabalin (LYRICA) 150 MG capsule, Take 150 mg by mouth daily., Disp: , Rfl:    PROAIR HFA 108 (90 BASE) MCG/ACT inhaler, INHALE 2 PUFFS INTO THE LUNGS EVERY 6 (SIX) HOURS AS NEEDED FOR WHEEZING. (Patient taking differently: Inhale 2 puffs into the lungs every 6 (six) hours as needed for shortness of breath or wheezing.), Disp: 22.5 Inhaler, Rfl: 1   rOPINIRole (REQUIP) 1 MG tablet, Take 1 tablet by mouth at bedtime., Disp: , Rfl:    spironolactone (ALDACTONE) 25 MG tablet, Take 25 mg by mouth daily., Disp: , Rfl:    tamsulosin (FLOMAX) 0.4 MG  CAPS capsule, TAKE (1) CAPSULE BY MOUTH ONCE DAILY., Disp: 30 capsule, Rfl: 11   YUPELRI 175 MCG/3ML nebulizer solution, Inhale 3 mLs (175 mcg total) into the lungs daily., Disp: 90 mL, Rfl: 11 No current facility-administered medications for this visit.  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   Compression fracture    L2   COPD (chronic obstructive pulmonary disease) (HCC)    bronchitis   CVA (cerebral infarction)    Disorder of vocal cord    Fatty liver    GERD (gastroesophageal reflux disease)    Hx of cardiovascular stress test    ETT/Lexiscan-Myoview (10/15):  Inferior, inferoapical, apical lateral defect c/w mild ischemia, EF 66%; Intermediate Risk   Hyperlipidemia    Hypertension    Right shoulder pain    2/2 partial rotator cuff tear, tendinopathy, mild subacromial/subdeltioid bursitis, A/C joint arthropathy per MRI done in 3/07   Tobacco abuse     Tobacco Use: Social History   Tobacco Use  Smoking Status Former   Packs/day: 2.00   Years: 48.00   Pack years: 96.00   Types: Cigarettes   Quit date: 11/22/2020   Years since quitting: 1.0  Smokeless Tobacco Never    Labs: Recent Review Flowsheet Data     Labs for ITP Cardiac and Pulmonary Rehab Latest Ref Rng & Units 10/04/2014 10/15/2014 02/11/2015 02/12/2015 05/08/2016   Cholestrol 0 - 200 mg/dL - - 179 - -   LDLCALC 0 - 99 mg/dL - - NOT CALC - -   LDLDIRECT mg/dL - - - 94 -   HDL >=40 mg/dL - - 36(L) - -   Trlycerides <150 mg/dL - - 402(H) - -   Hemoglobin A1c 4.0 - 6.0 % - - - - 5.8   PHART 7.350 - 7.450 7.421 7.366 - - -   PCO2ART 35.0 - 45.0 mmHg 39.8 49.7(H) - - -   HCO3 20.0 -  24.0 mEq/L 25.4(H) 28.2(H) - - -   TCO2 0 - 100 mmol/L 26.6 30 - - -   O2SAT % 92.6 97.0 - - -        Pulmonary Assessment Scores:  Pulmonary Assessment Scores     Row Name 11/26/21 1140         ADL UCSD   ADL Phase Entry     SOB Score total 39     Rest 0  Walk 3     Stairs 5     Bath 5     Dress 0     Shop 0  Patient wrote "N/A"       CAT Score   CAT Score 29       mMRC Score   mMRC Score 4              UCSD: Self-administered rating of dyspnea associated with activities of daily living (ADLs) 6-point scale (0 = "not at all" to 5 = "maximal or unable to do because of breathlessness")  Scoring Scores range from 0 to 120.  Minimally important difference is 5 units  CAT: CAT can identify the health impairment of COPD patients and is better correlated with disease progression.  CAT has a scoring range of zero to 40. The CAT score is classified into four groups of low (less than 10), medium (10 - 20), high (21-30) and very high (31-40) based on the impact level of disease on health status. A CAT score over 10 suggests significant symptoms.  A worsening CAT score could be explained by an exacerbation, poor medication adherence, poor inhaler technique, or progression of COPD or comorbid conditions.  CAT MCID is 2 points  mMRC: mMRC (Modified Medical Research Council) Dyspnea Scale is used to assess the degree of baseline functional disability in patients of respiratory disease due to dyspnea. No minimal important difference is established. A decrease in score of 1 point or greater is considered a positive change.   Pulmonary Function Assessment:  Pulmonary Function Assessment - 11/26/21 1137       Pulmonary Function Tests   FVC% 66 %    FEV1% 29 %    FEV1/FVC Ratio 33             Exercise Target Goals: Exercise Program Goal: Individual exercise prescription set using results from initial 6 min walk test and THRR while considering  patients activity  barriers and safety.   Exercise Prescription Goal: Initial exercise prescription builds to 30-45 minutes a day of aerobic activity, 2-3 days per week.  Home exercise guidelines will be given to patient during program as part of exercise prescription that the participant will acknowledge.  Education: Aerobic Exercise: - Group verbal and visual presentation on the components of exercise prescription. Introduces F.I.T.T principle from ACSM for exercise prescriptions.  Reviews F.I.T.T. principles of aerobic exercise including progression. Written material given at graduation.   Education: Resistance Exercise: - Group verbal and visual presentation on the components of exercise prescription. Introduces F.I.T.T principle from ACSM for exercise prescriptions  Reviews F.I.T.T. principles of resistance exercise including progression. Written material given at graduation.    Education: Exercise & Equipment Safety: - Individual verbal instruction and demonstration of equipment use and safety with use of the equipment. Flowsheet Row Pulmonary Rehab from 11/26/2021 in [facility] Cardiac and Pulmonary Rehab  Education need identified 11/26/21  Date 11/26/21  Educator [place]  Instruction Review Code 1- Verbalizes Understanding       Education: Exercise Physiology & General Exercise Guidelines: - Group verbal and written instruction with models to review the exercise physiology of the cardiovascular system and associated critical values. Provides general exercise guidelines with specific guidelines to those with heart or lung disease.    Education: Flexibility, Balance, Mind/Body Relaxation: - Group verbal and visual presentation with interactive activity on the components of exercise prescription. Introduces F.I.T.T principle from ACSM for exercise prescriptions. Reviews F.I.T.T. principles of flexibility and balance exercise training  including progression. Also discusses the mind body connection.  Reviews various  relaxation techniques to help reduce and manage stress (i.e. Deep breathing, progressive muscle relaxation, and visualization). Balance handout provided to take home. Written material given at graduation.   Activity Barriers & Risk Stratification:  Activity Barriers & Cardiac Risk Stratification - 11/26/21 1420       Activity Barriers & Cardiac Risk Stratification   Activity Barriers Back Problems;Joint Problems;Shortness of Breath;Deconditioning;Muscular [employer] Device;Other (comment)    Comments Chronic pain all over- being managed by pain clinic             6 Minute Walk:  6 Minute Walk     Row Name 11/26/21 1156         6 Minute Walk   Phase Initial     Distance 1000 feet     Walk Time 6 minutes     # of Rest Breaks 0     MPH 1.89     METS 2.24     RPE 15     Perceived Dyspnea  3     VO2 Peak 7.84     Symptoms Yes (comment)     Comments SOB, buttock pain 7/10     Resting HR 76 bpm     Resting BP 90/58     Resting Oxygen Saturation  94 %     Exercise Oxygen Saturation  during 6 min walk 93 %     Max Ex. HR 104 bpm     Max Ex. BP 98/56     2 Minute Post BP 94/52       Interval HR   1 Minute HR 92     2 Minute HR 96     3 Minute HR 103     4 Minute HR 99     5 Minute HR 96     6 Minute HR 104     2 Minute Post HR 83     Interval Heart Rate? Yes       Interval Oxygen   Interval Oxygen? Yes     Baseline Oxygen Saturation % 94 %     1 Minute Oxygen Saturation % 94 %     1 Minute Liters of Oxygen 3 L     2 Minute Oxygen Saturation % 93 %     2 Minute Liters of Oxygen 3 L     3 Minute Oxygen Saturation % 95 %     3 Minute Liters of Oxygen 3 L     4 Minute Oxygen Saturation % 94 %     4 Minute Liters of Oxygen 3 L     5 Minute Oxygen Saturation % 95 %     5 Minute Liters of Oxygen 3 L     6 Minute Oxygen Saturation % 96 %     6 Minute Liters of Oxygen 3 L     2 Minute Post Oxygen Saturation % 95 %     2 Minute Post Liters of Oxygen 3 L              Oxygen Initial Assessment:  Oxygen Initial Assessment - 11/26/21 1425       Home Oxygen   Home Oxygen Device Home Concentrator;Portable Concentrator;E-Tanks    Sleep Oxygen Prescription Continuous    Liters per minute 3    Home Exercise Oxygen Prescription Continuous    Liters per minute 3    Home Resting Oxygen Prescription Continuous  Liters per minute 3    Compliance with Home Oxygen Use Yes      Initial 6 min Walk   Oxygen Used Continuous    Liters per minute 3   could use 4 if needed     Intervention   Short Term Goals To learn and exhibit compliance with exercise, home and travel O2 prescription;To learn and understand importance of monitoring SPO2 with pulse oximeter and demonstrate accurate use of the pulse oximeter.;To learn and understand importance of maintaining oxygen saturations>88%;To learn and demonstrate proper pursed lip breathing techniques or other breathing techniques. ;To learn and demonstrate proper use of respiratory medications    Long  Term Goals Exhibits compliance with exercise, home  and travel O2 prescription;Verbalizes importance of monitoring SPO2 with pulse oximeter and return demonstration;Maintenance of O2 saturations>88%;Exhibits proper breathing techniques, such as pursed lip breathing or other method taught during program session;Compliance with respiratory medication;Demonstrates proper use of MDIs             Oxygen Re-Evaluation:   Oxygen Discharge (Final Oxygen Re-Evaluation):   Initial Exercise Prescription:  Initial Exercise Prescription - 11/26/21 1400       Date of Initial Exercise RX and Referring Provider   Date 11/26/21    Referring Provider Vernard Gambles MD      Oxygen   Oxygen Continuous    Liters 4   Ranges 3-4   Maintain Oxygen Saturation 88% or higher      REL-XR   Level 1    Speed 50    Minutes 15    METs 2.2      Biostep-RELP   Level 1    SPM 50    Minutes 15    METs 2.2       Track   Laps 18    Minutes 15    METs 1.98      Prescription Details   Frequency (times per week) 2    Duration Progress to 30 minutes of continuous aerobic without signs/symptoms of physical distress      Intensity   THRR 40-80% of Max Heartrate 107-139    Ratings of Perceived Exertion 11-13    Perceived Dyspnea 0-4      Progression   Progression Continue to progress workloads to maintain intensity without signs/symptoms of physical distress.      Resistance Training   Training Prescription Yes    Weight 3 lb    Reps 10-15             Perform Capillary Blood Glucose checks as needed.  Exercise Prescription Changes:   Exercise Prescription Changes     Row Name 11/26/21 1400             Response to Exercise   Blood Pressure (Admit) 90/58       Blood Pressure (Exercise) 98/56       Blood Pressure (Exit) 94/52       Heart Rate (Admit) 76 bpm       Heart Rate (Exercise) 104 bpm       Heart Rate (Exit) 83 bpm       Oxygen Saturation (Admit) 94 %       Oxygen Saturation (Exercise) 93 %       Oxygen Saturation (Exit) 95 %       Rating of Perceived Exertion (Exercise) 15       Perceived Dyspnea (Exercise) 3       Symptoms SOB, buttock pain 7/10  Comments walk test results                Exercise Comments:   Exercise Goals and Review:   Exercise Goals     Row Name 11/26/21 1423             Exercise Goals   Increase Physical Activity Yes       Intervention Provide advice, education, support and counseling about physical activity/exercise needs.;Develop an individualized exercise prescription for aerobic and resistive training based on initial evaluation findings, risk stratification, comorbidities and participant's personal goals.       Expected Outcomes Short Term: Attend rehab on a regular basis to increase amount of physical activity.;Long Term: Add in home exercise to make exercise part of routine and to increase amount of physical  activity.;Long Term: Exercising regularly at least 3-5 days a week.       Increase Strength and Stamina Yes       Intervention Provide advice, education, support and counseling about physical activity/exercise needs.;Develop an individualized exercise prescription for aerobic and resistive training based on initial evaluation findings, risk stratification, comorbidities and participant's personal goals.       Expected Outcomes Short Term: Perform resistance training exercises routinely during rehab and add in resistance training at home;Short Term: Increase workloads from initial exercise prescription for resistance, speed, and METs.;Long Term: Improve cardiorespiratory fitness, muscular endurance and strength as measured by increased METs and functional capacity (6MWT)       Able to understand and use rate of perceived exertion (RPE) scale Yes       Intervention Provide education and explanation on how to use RPE scale       Expected Outcomes Short Term: Able to use RPE daily in rehab to express subjective intensity level;Long Term:  Able to use RPE to guide intensity level when exercising independently       Able to understand and use Dyspnea scale Yes       Intervention Provide education and explanation on how to use Dyspnea scale       Expected Outcomes Short Term: Able to use Dyspnea scale daily in rehab to express subjective sense of shortness of breath during exertion;Long Term: Able to use Dyspnea scale to guide intensity level when exercising independently       Knowledge and understanding of Target Heart Rate Range (THRR) Yes       Intervention Provide education and explanation of THRR including how the numbers were predicted and where they are located for reference       Expected Outcomes Short Term: Able to state/look up THRR;Long Term: Able to use THRR to govern intensity when exercising independently;Short Term: Able to use daily as guideline for intensity in rehab       Able to check  pulse independently Yes       Intervention Provide education and demonstration on how to check pulse in carotid and radial arteries.;Review the importance of being able to check your own pulse for safety during independent exercise       Expected Outcomes Short Term: Able to explain why pulse checking is important during independent exercise;Long Term: Able to check pulse independently and accurately       Understanding of Exercise Prescription Yes       Intervention Provide education, explanation, and written materials on patient's individual exercise prescription       Expected Outcomes Short Term: Able to explain program exercise prescription;Long Term: Able to explain home exercise prescription  to exercise independently                Exercise Goals Re-Evaluation :   Discharge Exercise Prescription (Final Exercise Prescription Changes):  Exercise Prescription Changes - 11/26/21 1400       Response to Exercise   Blood Pressure (Admit) 90/58    Blood Pressure (Exercise) 98/56    Blood Pressure (Exit) 94/52    Heart Rate (Admit) 76 bpm    Heart Rate (Exercise) 104 bpm    Heart Rate (Exit) 83 bpm    Oxygen Saturation (Admit) 94 %    Oxygen Saturation (Exercise) 93 %    Oxygen Saturation (Exit) 95 %    Rating of Perceived Exertion (Exercise) 15    Perceived Dyspnea (Exercise) 3    Symptoms SOB, buttock pain 7/10    Comments walk test results             Nutrition:  Target Goals: Understanding of nutrition guidelines, daily intake of sodium 1500mg , cholesterol 200mg , calories 30% from fat and 7% or less from saturated fats, daily to have 5 or more servings of fruits and vegetables.  Education: All About Nutrition: -Group instruction provided by verbal, written material, interactive activities, discussions, models, and posters to present general guidelines for heart healthy nutrition including fat, fiber, MyPlate, the role of sodium in heart healthy nutrition, utilization  of the nutrition label, and utilization of this knowledge for meal planning. Follow up email sent as well. Written material given at graduation.   Biometrics:  Pre Biometrics - 11/26/21 1424       Pre Biometrics   Height 5' 10.25" (1.784 m)    Weight 220 lb (99.8 kg)    BMI (Calculated) 31.35              Nutrition Therapy Plan and Nutrition Goals:  Nutrition Therapy & Goals - 11/26/21 1426       Intervention Plan   Intervention Prescribe, educate and counsel regarding individualized specific dietary modifications aiming towards targeted core components such as weight, hypertension, lipid management, diabetes, heart failure and other comorbidities.    Expected Outcomes Short Term Goal: Understand basic principles of dietary content, such as calories, fat, sodium, cholesterol and nutrients.;Short Term Goal: A plan has been developed with personal nutrition goals set during dietitian appointment.;Long Term Goal: Adherence to prescribed nutrition plan.             Nutrition Assessments:  MEDIFICTS Score Key: ?70 Need to make dietary changes  40-70 Heart Healthy Diet ? 40 Therapeutic Level Cholesterol Diet  Flowsheet Row Pulmonary Rehab from 11/26/2021 in [facility] Cardiac and Pulmonary Rehab  Picture Your Plate Total Score on Admission 40      Picture Your Plate Scores: <97 Unhealthy dietary pattern with much room for improvement. 41-50 Dietary pattern unlikely to meet recommendations for good health and room for improvement. 51-60 More healthful dietary pattern, with some room for improvement.  >60 Healthy dietary pattern, although there may be some specific behaviors that could be improved.   Nutrition Goals Re-Evaluation:   Nutrition Goals Discharge (Final Nutrition Goals Re-Evaluation):   Psychosocial: Target Goals: Acknowledge presence or absence of significant depression and/or stress, maximize coping skills, provide positive support system. Participant is able  to verbalize types and ability to use techniques and skills needed for reducing stress and depression.   Education: Stress, Anxiety, and Depression - Group verbal and visual presentation to define topics covered.  Reviews how body is impacted by stress, anxiety,  and depression.  Also discusses healthy ways to reduce stress and to treat/manage anxiety and depression.  Written material given at graduation.   Education: Sleep Hygiene -Provides group verbal and written instruction about how sleep can affect your health.  Define sleep hygiene, discuss sleep cycles and impact of sleep habits. Review good sleep hygiene tips.    Initial Review & Psychosocial Screening:  Initial Psych Review & Screening - 10/29/21 0933       Initial Review   Current issues with Current Psychotropic Meds;Current Depression;Current Anxiety/Panic;Current Stress Concerns   Anxiety manifests-mind racing, can't sit still   Source of Stress Concerns Chronic Illness;Family;Financial;Unable to participate in former interests or hobbies;Unable to perform yard/household activities    Comments Lives with son and his family son daughter in law and their 4 children      [place]? Yes   Son and his family     Barriers   Psychosocial barriers to participate in program The patient should benefit from training in stress management and relaxation.      Screening Interventions   Interventions Encouraged to exercise;To provide support and resources with identified psychosocial needs;Provide feedback about the scores to participant    Expected Outcomes Short Term goal: Utilizing psychosocial counselor, staff and physician to assist with identification of specific Stressors or current issues interfering with healing process. Setting desired goal for each stressor or current issue identified.;Long Term Goal: Stressors or current issues are controlled or eliminated.;Short Term goal: Identification and review  with participant of any Quality of Life or Depression concerns found by scoring the questionnaire.;Long Term goal: The participant improves quality of Life and PHQ9 Scores as seen by post scores and/or verbalization of changes             Quality of Life Scores:  Scores of 19 and below usually indicate a poorer quality of life in these areas.  A difference of  2-3 points is a clinically meaningful difference.  A difference of 2-3 points in the total score of the Quality of Life Index has been associated with significant improvement in overall quality of life, self-image, physical symptoms, and general health in studies assessing change in quality of life.  PHQ-9: Recent Review Flowsheet Data     Depression screen [facility] 2/9 11/26/2021 03/19/2015 03/14/2015 02/11/2015 12/23/2014   Decreased Interest 1 0 0  0 0   Down, Depressed, Hopeless 2 0 0 0  0   PHQ - 2 Score 3 0 0 0 0   Altered sleeping 3 - - - -   Tired, decreased energy 2 - - - -   Change in appetite 0 - - - -   Feeling bad or failure about yourself  0 - - - -   Trouble concentrating 0 - - - -   Moving slowly or fidgety/restless 0 - - - -   Suicidal thoughts 0 - - - -   PHQ-9 Score 8 - - - -   Difficult doing work/chores Somewhat difficult - - - -      Interpretation of Total Score  Total Score Depression Severity:  1-4 = Minimal depression, 5-9 = Mild depression, 10-14 = Moderate depression, 15-19 = Moderately severe depression, 20-27 = Severe depression   Psychosocial Evaluation and Intervention:  Psychosocial Evaluation - 10/29/21 0950       Psychosocial Evaluation & Interventions   Interventions Encouraged to exercise with the program and follow exercise prescription;Stress management education  Comments Sam is ready to try the program. He  lives in [facility] [place] [facility] and it is a long drive for him. We discussed that 2 days a week is required to benefit from the program. He does want to see if he can attend. He has chronic  pain from several sources, back plates and screws, hip pain, old shoulder injury. He does take pain medicaine on a daily basis to help him through the day. He lives with his son and his son's family (wife and 4 children ages 34-16) He does have a cat in the house.  He speaks that he does have stress. Not being able to do what he used to do. Being dependent on pain medication. His son and family are his support system.    Expected Outcomes STG: Sam will be able to attend all scheduled sessions. He will learn and utilize Stress stragies LTG Sam will continue with his exercise progression and his Stress management    Continue Psychosocial Services  Follow up required by staff             Psychosocial Re-Evaluation:   Psychosocial Discharge (Final Psychosocial Re-Evaluation):   Education: Education Goals: Education classes will be provided on a weekly basis, covering required topics. Participant will state understanding/return demonstration of topics presented.  Learning Barriers/Preferences:   General Pulmonary Education Topics:  Infection Prevention: - Provides verbal and written material to individual with discussion of infection control including proper hand washing and proper equipment cleaning during exercise session. Flowsheet Row Pulmonary Rehab from 11/26/2021 in [facility] Cardiac and Pulmonary Rehab  Education need identified 11/26/21  Date 11/26/21  Educator [place]  Instruction Review Code 1- Verbalizes Understanding       Falls Prevention: - Provides verbal and written material to individual with discussion of falls prevention and safety. Flowsheet Row Pulmonary Rehab from 11/26/2021 in [facility] Cardiac and Pulmonary Rehab  Education need identified 11/26/21  Date 11/26/21  Educator [place]  Instruction Review Code 1- Verbalizes Understanding       Chronic Lung Disease Review: - Group verbal instruction with posters, models, PowerPoint presentations and videos,  to review new updates,  new respiratory medications, new advancements in procedures and treatments. Providing information on websites and "800" numbers for continued self-education. Includes information about supplement oxygen, available portable oxygen systems, continuous and intermittent flow rates, oxygen safety, concentrators, and Medicare reimbursement for oxygen. Explanation of Pulmonary Drugs, including class, frequency, complications, importance of spacers, rinsing mouth after steroid MDI's, and proper cleaning methods for nebulizers. Review of basic lung anatomy and physiology related to function, structure, and complications of lung disease. Review of risk factors. Discussion about methods for diagnosing sleep apnea and types of masks and machines for OSA. Includes a review of the use of types of environmental controls: home humidity, furnaces, filters, dust mite/pet prevention, HEPA vacuums. Discussion about weather changes, air quality and the benefits of nasal washing. Instruction on Warning signs, infection symptoms, calling MD promptly, preventive modes, and value of vaccinations. Review of effective airway clearance, coughing and/or vibration techniques. Emphasizing that all should Create an Action Plan. Written material given at graduation. Flowsheet Row Pulmonary Rehab from 11/26/2021 in [facility] Cardiac and Pulmonary Rehab  Education need identified 11/26/21       AED/CPR: - Group verbal and written instruction with the use of models to demonstrate the basic use of the AED with the basic ABC's of resuscitation.    Anatomy and Cardiac Procedures: - Group verbal and visual presentation and models  provide information about basic cardiac anatomy and function. Reviews the testing methods done to diagnose heart disease and the outcomes of the test results. Describes the treatment choices: Medical Management, Angioplasty, or Coronary Bypass Surgery for treating various heart conditions including Myocardial Infarction,  Angina, Valve Disease, and Cardiac Arrhythmias.  Written material given at graduation. Flowsheet Row Pulmonary Rehab from 11/26/2021 in [facility] Cardiac and Pulmonary Rehab  Education need identified 11/26/21       Medication Safety: - Group verbal and visual instruction to review commonly prescribed medications for heart and lung disease. Reviews the medication, class of the drug, and side effects. Includes the steps to properly store meds and maintain the prescription regimen.  Written material given at graduation.   Other: -Provides group and verbal instruction on various topics (see comments)   Knowledge Questionnaire Score:  Knowledge Questionnaire Score - 11/26/21 1425       Knowledge Questionnaire Score   Pre Score 15/18: A&P, Oxygen, ADL              Core Components/Risk Factors/Patient Goals at Admission:  Personal Goals and Risk Factors at Admission - 11/26/21 1424       Core Components/Risk Factors/Patient Goals on Admission    Weight Management Yes;Weight Loss    Intervention Weight Management: Develop a combined nutrition and exercise program designed to reach desired caloric intake, while maintaining appropriate intake of nutrient and fiber, sodium and fats, and appropriate energy expenditure required for the weight goal.    Admit Weight 220 lb (99.8 kg)    Goal Weight: Short Term 215 lb (97.5 kg)    Goal Weight: Long Term 210 lb (95.3 kg)    Expected Outcomes Short Term: Continue to assess and modify interventions until short term weight is achieved;Long Term: Adherence to nutrition and physical activity/exercise program aimed toward attainment of established weight goal;Weight Loss: Understanding of general recommendations for a balanced deficit meal plan, which promotes 1-2 lb weight loss per week and includes a negative energy balance of [phone] kcal/d;Understanding recommendations for meals to include 15-35% energy as protein, 25-35% energy from fat, 35-60% energy  from carbohydrates, less than 200mg  of dietary cholesterol, 20-35 gm of total fiber daily;Understanding of distribution of calorie intake throughout the day with the consumption of 4-5 meals/snacks    Improve shortness of breath with ADL's Yes    Intervention Provide education, individualized exercise plan and daily activity instruction to help decrease symptoms of SOB with activities of daily living.    Expected Outcomes Short Term: Improve cardiorespiratory fitness to achieve a reduction of symptoms when performing ADLs;Long Term: Be able to perform more ADLs without symptoms or delay the onset of symptoms    Increase knowledge of respiratory medications and ability to use respiratory devices properly  Yes    Intervention Provide education and demonstration as needed of appropriate use of medications, inhalers, and oxygen therapy.    Expected Outcomes Short Term: Achieves understanding of medications use. Understands that oxygen is a medication prescribed by physician. Demonstrates appropriate use of inhaler and oxygen therapy.;Long Term: Maintain appropriate use of medications, inhalers, and oxygen therapy.    Hypertension Yes    Intervention Provide education on lifestyle modifcations including regular physical activity/exercise, weight management, moderate sodium restriction and increased consumption of fresh fruit, vegetables, and low fat dairy, alcohol moderation, and smoking cessation.;Monitor prescription use compliance.    Expected Outcomes Short Term: Continued assessment and intervention until BP is < 140/69mm HG in hypertensive participants. < 130/15mm HG  in hypertensive participants with diabetes, heart failure or chronic kidney disease.;Long Term: Maintenance of blood pressure at goal levels.    Lipids Yes    Intervention Provide education and support for participant on nutrition & aerobic/resistive exercise along with prescribed medications to achieve LDL 70mg , HDL >40mg .    Expected  Outcomes Short Term: Participant states understanding of desired cholesterol values and is compliant with medications prescribed. Participant is following exercise prescription and nutrition guidelines.;Long Term: Cholesterol controlled with medications as prescribed, with individualized exercise RX and with personalized nutrition plan. Value goals: LDL < 70mg , HDL > 40 mg.    Stress Yes    Intervention Offer individual and/or small group education and counseling on adjustment to heart disease, stress management and health-related lifestyle change. Teach and support self-help strategies.;Refer participants experiencing significant psychosocial distress to appropriate mental health specialists for further evaluation and treatment. When possible, include family members and significant others in education/counseling sessions.    Expected Outcomes Short Term: Participant demonstrates changes in health-related behavior, relaxation and other stress management skills, ability to obtain effective social support, and compliance with psychotropic medications if prescribed.;Long Term: Emotional wellbeing is indicated by absence of clinically significant psychosocial distress or social isolation.             Education:Diabetes - Individual verbal and written instruction to review signs/symptoms of diabetes, desired ranges of glucose level fasting, after meals and with exercise. Acknowledge that pre and post exercise glucose checks will be done for 3 sessions at entry of program.   Know Your Numbers and Heart Failure: - Group verbal and visual instruction to discuss disease risk factors for cardiac and pulmonary disease and treatment options.  Reviews associated critical values for Overweight/Obesity, Hypertension, Cholesterol, and Diabetes.  Discusses basics of heart failure: signs/symptoms and treatments.  Introduces Heart Failure Zone chart for action plan for heart failure.  Written material given at  graduation.   Core Components/Risk Factors/Patient Goals Review:    Core Components/Risk Factors/Patient Goals at Discharge (Final Review):    ITP Comments:  ITP Comments     Row Name 10/29/21 0941 10/29/21 1002 11/26/21 1133 12/16/21 0912     ITP Comments Virtual orientation call completed today.  He has an appointment on Date: 11/26/2021  for EP eval and gym Orientation.  Documentation of diagnosis can be found in [facility] Date: 10/08/2021 . Sam will bring his medication s to confirm he is on all on his [facility] Completed 6MWT and gym orientation. Initial ITP created and sent for review to Dr. Ottie Glazier, Medical Director. 30 Day review completed. Medical Director ITP review done, changes made as directed, and signed approval by Medical Director.   new to program             Comments:

## 2021-12-21 ENCOUNTER — Other Ambulatory Visit: Payer: Self-pay

## 2021-12-21 ENCOUNTER — Encounter: Payer: Medicare HMO | Admitting: *Deleted

## 2021-12-21 DIAGNOSIS — J449 Chronic obstructive pulmonary disease, unspecified: Secondary | ICD-10-CM

## 2021-12-21 NOTE — Progress Notes (Signed)
Daily Session Note ° °Patient Details  °Name: Bradley Sawyer °MRN: [id number] °Date of Birth: 10/06/1956 °Referring Provider:   °Flowsheet Row Pulmonary Rehab from 11/26/2021 in [facility] Cardiac and Pulmonary Rehab  °Referring Provider Gonzalez, Carmen MD  ° °  ° ° °Encounter Date: 12/21/2021 ° °Check In: ° Session Check In - 12/21/21 1107   ° °  ° Check-In  ° Supervising physician immediately available to respond to emergencies See telemetry face sheet for immediately available ER MD   ° Location [facility]-Cardiac & Pulmonary Rehab   ° Staff Present Meredith Craven, RN BSN;Joseph Hood, RCP,RRT,BSRT;Laureen Brown, BS, RRT, CPFT;Kelly Bollinger, MPA, RN   ° Virtual Visit No   ° Medication changes reported     No   ° Fall or balance concerns reported    No   ° Warm-up and Cool-down Performed on first and last piece of equipment   ° Resistance Training Performed Yes   ° VAD Patient? No   ° PAD/SET Patient? No   °  ° Pain Assessment  ° Currently in Pain? No/denies   ° °  °  ° °  ° ° ° ° ° °Social History  ° °Tobacco Use  °Smoking Status Former  ° Packs/day: 2.00  ° Years: 48.00  ° Pack years: 96.00  ° Types: Cigarettes  ° Quit date: 11/22/2020  ° Years since quitting: 1.0  °Smokeless Tobacco Never  ° ° °Goals Met:  °Independence with exercise equipment °Exercise tolerated well °No report of concerns or symptoms today °Strength training completed today ° °Goals Unmet:  °Not Applicable ° °Comments: First full day of exercise!  Patient was oriented to gym and equipment including functions, settings, policies, and procedures.  Patient's individual exercise prescription and treatment plan were reviewed.  All starting workloads were established based on the results of the 6 minute walk test done at initial orientation visit.  The plan for exercise progression was also introduced and progression will be customized based on patient's performance and goals. ° ° ° °Dr. Mark Miller is Medical Director for [facility].   °Dr. Fuad Aleskerov is Medical Director for [facility]. °

## 2021-12-23 ENCOUNTER — Encounter: Payer: Medicare HMO | Attending: Pulmonary Disease

## 2021-12-23 ENCOUNTER — Other Ambulatory Visit: Payer: Self-pay

## 2021-12-23 DIAGNOSIS — R0600 Dyspnea, unspecified: Secondary | ICD-10-CM | POA: Insufficient documentation

## 2021-12-23 DIAGNOSIS — J449 Chronic obstructive pulmonary disease, unspecified: Secondary | ICD-10-CM | POA: Insufficient documentation

## 2021-12-23 DIAGNOSIS — Z5189 Encounter for other specified aftercare: Secondary | ICD-10-CM | POA: Insufficient documentation

## 2021-12-23 NOTE — Progress Notes (Signed)
Daily Session Note ° °Patient Details  °Name: Bradley Sawyer °MRN: [id number] °Date of Birth: 12/14/1955 °Referring Provider:   °Flowsheet Row Pulmonary Rehab from 11/26/2021 in [facility] Cardiac and Pulmonary Rehab  °Referring Provider Gonzalez, Carmen MD  ° °  ° ° °Encounter Date: 12/23/2021 ° °Check In: ° Session Check In - 12/23/21 1041   ° °  ° Check-In  ° Supervising physician immediately available to respond to emergencies See telemetry face sheet for immediately available ER MD   ° Location [facility]-Cardiac & Pulmonary Rehab   ° Staff Present Kelly Bollinger, MPA, RN;Joseph Hood, RCP,RRT,BSRT;Amanda Sommer, BA, ACSM CEP, Exercise Physiologist   ° Virtual Visit No   ° Medication changes reported     No   ° Fall or balance concerns reported    No   ° Warm-up and Cool-down Performed on first and last piece of equipment   ° Resistance Training Performed Yes   ° VAD Patient? No   ° PAD/SET Patient? No   °  ° Pain Assessment  ° Currently in Pain? No/denies   ° °  °  ° °  ° ° ° ° ° °Social History  ° °Tobacco Use  °Smoking Status Former  ° Packs/day: 2.00  ° Years: 48.00  ° Pack years: 96.00  ° Types: Cigarettes  ° Quit date: 11/22/2020  ° Years since quitting: 1.0  °Smokeless Tobacco Never  ° ° °Goals Met:  °Independence with exercise equipment °Exercise tolerated well °No report of concerns or symptoms today °Strength training completed today ° °Goals Unmet:  °Not Applicable ° °Comments: Pt able to follow exercise prescription today without complaint.  Will continue to monitor for progression. ° ° ° °Dr. Mark Miller is Medical Director for [facility].  °Dr. Fuad Aleskerov is Medical Director for [facility]. °

## 2021-12-26 ENCOUNTER — Other Ambulatory Visit: Payer: Self-pay | Admitting: Pulmonary Disease

## 2021-12-30 ENCOUNTER — Other Ambulatory Visit: Payer: Self-pay

## 2021-12-30 DIAGNOSIS — Z5189 Encounter for other specified aftercare: Secondary | ICD-10-CM | POA: Diagnosis not present

## 2021-12-30 DIAGNOSIS — J449 Chronic obstructive pulmonary disease, unspecified: Secondary | ICD-10-CM

## 2021-12-30 DIAGNOSIS — R0600 Dyspnea, unspecified: Secondary | ICD-10-CM

## 2021-12-30 NOTE — Progress Notes (Signed)
Daily Session Note  Patient Details  Name: Bradley Sawyer MRN: [id number] Date of Birth: 09/27/1956 Referring Provider:   Flowsheet Row Pulmonary Rehab from 11/26/2021 in [facility] Cardiac and Pulmonary Rehab  Referring Provider Vernard Gambles MD       Encounter Date: 12/30/2021  Check In:  Session Check In - 12/30/21 1037       Check-In   Supervising physician immediately available to respond to emergencies See telemetry face sheet for immediately available ER MD    Location [facility]-Cardiac & Pulmonary Rehab    Staff Present Birdie Sons, MPA, Elveria Rising, BA, ACSM CEP, Exercise Physiologist;Melissa [place], RDN, LDN;Meredith Sherryll Burger, RN BSN    Virtual Visit No    Medication changes reported     No    Fall or balance concerns reported    No    Warm-up and Cool-down Performed on first and last piece of equipment    Resistance Training Performed Yes    VAD Patient? No    PAD/SET Patient? No      Pain Assessment   Currently in Pain? No/denies                Social History   Tobacco Use  Smoking Status Former   Packs/day: 2.00   Years: 48.00   Pack years: 96.00   Types: Cigarettes   Quit date: 11/22/2020   Years since quitting: 1.1  Smokeless Tobacco Never    Goals Met:  Independence with exercise equipment Exercise tolerated well No report of concerns or symptoms today Strength training completed today  Goals Unmet:  Not Applicable  Comments: Pt able to follow exercise prescription today without complaint.  Will continue to monitor for progression.    Dr. Emily Filbert is Medical Director for [place].  Dr. Ottie Glazier is Medical Director for [facility] Pulmonary Rehabilitation.

## 2022-01-01 ENCOUNTER — Encounter: Payer: Medicare HMO | Admitting: *Deleted

## 2022-01-01 ENCOUNTER — Other Ambulatory Visit: Payer: Self-pay

## 2022-01-01 DIAGNOSIS — R0600 Dyspnea, unspecified: Secondary | ICD-10-CM

## 2022-01-01 DIAGNOSIS — Z5189 Encounter for other specified aftercare: Secondary | ICD-10-CM | POA: Diagnosis not present

## 2022-01-01 DIAGNOSIS — J449 Chronic obstructive pulmonary disease, unspecified: Secondary | ICD-10-CM

## 2022-01-01 NOTE — Progress Notes (Signed)
Daily Session Note  Patient Details  Name: Bradley Sawyer MRN: [id number] Date of Birth: November 12, 1956 Referring Provider:   Flowsheet Row Pulmonary Rehab from 11/26/2021 in [facility] Cardiac and Pulmonary Rehab  Referring Provider Vernard Gambles MD       Encounter Date: 01/01/2022  Check In:  Session Check In - 01/01/22 1107       Check-In   Supervising physician immediately available to respond to emergencies See telemetry face sheet for immediately available ER MD    Location [facility]-Cardiac & Pulmonary Rehab    Staff Present Renita Papa, RN BSN;Joseph [place], RCP,RRT,BSRT;Jessica [place], [place], RCEP, CCRP, CCET    Virtual Visit No    Medication changes reported     No    Fall or balance concerns reported    No    Warm-up and Cool-down Performed on first and last piece of equipment    Resistance Training Performed Yes    VAD Patient? No    PAD/SET Patient? No      Pain Assessment   Currently in Pain? No/denies                Social History   Tobacco Use  Smoking Status Former   Packs/day: 2.00   Years: 48.00   Pack years: 96.00   Types: Cigarettes   Quit date: 11/22/2020   Years since quitting: 1.1  Smokeless Tobacco Never    Goals Met:  Independence with exercise equipment Exercise tolerated well No report of concerns or symptoms today Strength training completed today  Goals Unmet:  Not Applicable  Comments: Pt able to follow exercise prescription today without complaint.  Will continue to monitor for progression.    Dr. Emily Filbert is Medical Director for [place].  Dr. Ottie Glazier is Medical Director for [facility] Pulmonary Rehabilitation.

## 2022-01-04 ENCOUNTER — Encounter: Payer: Medicare HMO | Admitting: *Deleted

## 2022-01-04 ENCOUNTER — Other Ambulatory Visit: Payer: Self-pay

## 2022-01-04 DIAGNOSIS — Z5189 Encounter for other specified aftercare: Secondary | ICD-10-CM | POA: Diagnosis not present

## 2022-01-04 DIAGNOSIS — R0600 Dyspnea, unspecified: Secondary | ICD-10-CM

## 2022-01-04 DIAGNOSIS — J449 Chronic obstructive pulmonary disease, unspecified: Secondary | ICD-10-CM

## 2022-01-04 NOTE — Progress Notes (Signed)
Daily Session Note  Patient Details  Name: Bradley Sawyer MRN: [id number] Date of Birth: December 05, 1955 Referring Provider:   Flowsheet Row Pulmonary Rehab from 11/26/2021 in [facility] Cardiac and Pulmonary Rehab  Referring Provider Vernard Gambles MD       Encounter Date: 01/04/2022  Check In:  Session Check In - 01/04/22 1107       Check-In   Supervising physician immediately available to respond to emergencies See telemetry face sheet for immediately available ER MD    Location [facility]-Cardiac & Pulmonary Rehab    Staff Present Renita Papa, RN Moises Blood, BS, ACSM CEP, Exercise Physiologist;Amanda Oletta Darter, BA, ACSM CEP, Exercise Physiologist;Kelly Rosalia Hammers, MPA, RN    Virtual Visit No    Medication changes reported     No    Fall or balance concerns reported    No    Warm-up and Cool-down Performed on first and last piece of equipment    Resistance Training Performed Yes    VAD Patient? No    PAD/SET Patient? No      Pain Assessment   Currently in Pain? No/denies                Social History   Tobacco Use  Smoking Status Former   Packs/day: 2.00   Years: 48.00   Pack years: 96.00   Types: Cigarettes   Quit date: 11/22/2020   Years since quitting: 1.1  Smokeless Tobacco Never    Goals Met:  Independence with exercise equipment Exercise tolerated well No report of concerns or symptoms today Strength training completed today  Goals Unmet:  Not Applicable  Comments: Pt able to follow exercise prescription today without complaint.  Will continue to monitor for progression.    Dr. Emily Filbert is Medical Director for [name].  Dr. Ottie Glazier is Medical Director for [name] Pulmonary Rehabilitation.

## 2022-01-05 ENCOUNTER — Other Ambulatory Visit (HOSPITAL_COMMUNITY): Payer: Self-pay

## 2022-01-05 ENCOUNTER — Telehealth: Payer: Self-pay | Admitting: Pulmonary Disease

## 2022-01-05 ENCOUNTER — Telehealth: Payer: Self-pay | Admitting: Pharmacy Technician

## 2022-01-05 DIAGNOSIS — J449 Chronic obstructive pulmonary disease, unspecified: Secondary | ICD-10-CM

## 2022-01-05 NOTE — Telephone Encounter (Signed)
PA has been submitted. If not covered under Part D, pt will need to fill with his Medicare Part B at a supplier that can bill for Part B. Will continue to follow with status determination.

## 2022-01-05 NOTE — Telephone Encounter (Signed)
Patient Advocate Encounter  Received notification from [facility]) that prior authorization for PERFOROMIST (FORMOTEROL) 20MCG is required.   PA submitted on 2.14.23 Key [id number]  Status is pending   [facility] will continue to follow  Luciano Cutter, CPhT Patient Advocate Phone: [phone] Fax:  [phone]

## 2022-01-05 NOTE — Telephone Encounter (Signed)
PA team, can you guys assist with this?

## 2022-01-06 ENCOUNTER — Other Ambulatory Visit: Payer: Self-pay

## 2022-01-06 DIAGNOSIS — J449 Chronic obstructive pulmonary disease, unspecified: Secondary | ICD-10-CM

## 2022-01-06 DIAGNOSIS — R0600 Dyspnea, unspecified: Secondary | ICD-10-CM

## 2022-01-06 DIAGNOSIS — Z5189 Encounter for other specified aftercare: Secondary | ICD-10-CM | POA: Diagnosis not present

## 2022-01-06 NOTE — Progress Notes (Signed)
Daily Session Note  Patient Details  Name: Bradley Sawyer MRN: [id number] Date of Birth: 28-Jan-1956 Referring Provider:   Flowsheet Row Pulmonary Rehab from 11/26/2021 in [facility] Cardiac and Pulmonary Rehab  Referring Provider Vernard Gambles MD       Encounter Date: 01/06/2022  Check In:  Session Check In - 01/06/22 1113       Check-In   Supervising physician immediately available to respond to emergencies See telemetry face sheet for immediately available ER MD    Location [facility]-Cardiac & Pulmonary Rehab    Staff Present Birdie Sons, MPA, RN;Melissa Jacona, RDN, Rowe Pavy, BA, ACSM CEP, Exercise Physiologist;Joseph [place], [place]    Virtual Visit No    Medication changes reported     No    Fall or balance concerns reported    No    Warm-up and Cool-down Performed on first and last piece of equipment    Resistance Training Performed Yes    VAD Patient? No    PAD/SET Patient? No      Pain Assessment   Currently in Pain? No/denies                Social History   Tobacco Use  Smoking Status Former   Packs/day: 2.00   Years: 48.00   Pack years: 96.00   Types: Cigarettes   Quit date: 11/22/2020   Years since quitting: 1.1  Smokeless Tobacco Never    Goals Met:  Independence with exercise equipment Exercise tolerated well No report of concerns or symptoms today Strength training completed today  Goals Unmet:  Not Applicable  Comments: Pt able to follow exercise prescription today without complaint.  Will continue to monitor for progression.    Dr. Emily Filbert is Medical Director for [place].  Dr. Ottie Glazier is Medical Director for [facility] Pulmonary Rehabilitation.

## 2022-01-07 MED ORDER — FORMOTEROL FUMARATE 20 MCG/2ML IN NEBU: 20.0000 ug | INHALATION_SOLUTION | Freq: Two times a day (BID) | RESPIRATORY_TRACT | 11 refills | Status: DC

## 2022-01-07 NOTE — Addendum Note (Signed)
Addended by: Claudette Head A on: 01/07/2022 04:21 PM   Modules accepted: Orders

## 2022-01-07 NOTE — Telephone Encounter (Signed)
Received a fax regarding Prior Authorization from [facility]) for FORMOTEROL 20MCG. Authorization has been DENIED because PT IS NOT IN A LTC/SNF BUT IS FROM HOME. DOES COVER UNDER PART B.  Phone#

## 2022-01-07 NOTE — Telephone Encounter (Signed)
Perforomist 20MCG sent to [employer]. Patient is aware and voiced his understanding.  Nothing further needed.

## 2022-01-08 ENCOUNTER — Encounter: Payer: Medicare HMO | Admitting: *Deleted

## 2022-01-08 ENCOUNTER — Other Ambulatory Visit: Payer: Self-pay

## 2022-01-08 DIAGNOSIS — R0600 Dyspnea, unspecified: Secondary | ICD-10-CM

## 2022-01-08 DIAGNOSIS — Z5189 Encounter for other specified aftercare: Secondary | ICD-10-CM | POA: Diagnosis not present

## 2022-01-08 DIAGNOSIS — J449 Chronic obstructive pulmonary disease, unspecified: Secondary | ICD-10-CM

## 2022-01-08 NOTE — Progress Notes (Signed)
Daily Session Note  Patient Details  Name: Bradley Sawyer MRN: [id number] Date of Birth: [birth year] Referring Provider:   Flowsheet Row Pulmonary Rehab from 11/26/2021 in [facility]  Referring Provider Vernard Gambles MD       Encounter Date: 01/08/2022  Check In:  Session Check In - 01/08/22 1110       Check-In   Supervising physician immediately available to respond to emergencies See telemetry face sheet for immediately available ER MD    Location [facility]    Staff Present Renita Papa, RN BSN;Joseph Pendleton, RCP,RRT,BSRT;Jessica [place], [place], RCEP, CCRP, CCET    Virtual Visit No    Medication changes reported     No    Fall or balance concerns reported    No    Warm-up and Cool-down Performed on first and last piece of equipment    Resistance Training Performed Yes    VAD Patient? No      Pain Assessment   Currently in Pain? No/denies                Social History   Tobacco Use  Smoking Status Former   Packs/day: 2.00   Years: 48.00   Pack years: 96.00   Types: Cigarettes   Quit date: 11/22/2020   Years since quitting: 1.1  Smokeless Tobacco Never    Goals Met:  Independence with exercise equipment Exercise tolerated well No report of concerns or symptoms today Strength training completed today  Goals Unmet:  Not Applicable  Comments: Pt able to follow exercise prescription today without complaint.  Will continue to monitor for progression.    Dr. Emily Filbert is Medical Director for [facility].  Dr. Ottie Glazier is Medical Director for [facility].

## 2022-01-08 NOTE — Telephone Encounter (Signed)
Please see 01/05/2022 phone note.

## 2022-01-11 ENCOUNTER — Other Ambulatory Visit: Payer: Self-pay

## 2022-01-11 ENCOUNTER — Encounter: Payer: Medicare HMO | Admitting: *Deleted

## 2022-01-11 DIAGNOSIS — Z5189 Encounter for other specified aftercare: Secondary | ICD-10-CM | POA: Diagnosis not present

## 2022-01-11 DIAGNOSIS — R0600 Dyspnea, unspecified: Secondary | ICD-10-CM

## 2022-01-11 DIAGNOSIS — J449 Chronic obstructive pulmonary disease, unspecified: Secondary | ICD-10-CM

## 2022-01-11 NOTE — Progress Notes (Signed)
Daily Session Note ° °Patient Details  °Name: Ovadia R Staniszewski °MRN: [id number] °Date of Birth: 09/29/1956 °Referring Provider:   °Flowsheet Row Pulmonary Rehab from 11/26/2021 in [facility]  °Referring Provider Gonzalez, Carmen MD  ° °  ° ° °Encounter Date: 01/11/2022 ° °Check In: ° Session Check In - 01/11/22 1121   ° °  ° Check-In  ° Supervising physician immediately available to respond to emergencies See telemetry face sheet for immediately available ER MD   ° Location [facility]   ° Staff Present Meredith Craven, RN BSN;Kelly Hayes, BS, ACSM CEP, Exercise Physiologist;Joseph Hood, RCP,RRT,BSRT   ° Virtual Visit No   ° Medication changes reported     No   ° Fall or balance concerns reported    No   ° Warm-up and Cool-down Performed on first and last piece of equipment   ° Resistance Training Performed Yes   ° VAD Patient? No   ° PAD/SET Patient? No   °  ° Pain Assessment  ° Currently in Pain? No/denies   ° °  °  ° °  ° ° ° ° ° °Social History  ° °Tobacco Use  °Smoking Status Former  ° Packs/day: 2.00  ° Years: 48.00  ° Pack years: 96.00  ° Types: Cigarettes  ° Quit date: 11/22/2020  ° Years since quitting: 1.1  °Smokeless Tobacco Never  ° ° °Goals Met:  °Independence with exercise equipment °Exercise tolerated well °No report of concerns or symptoms today °Strength training completed today ° °Goals Unmet:  °Not Applicable ° °Comments: Pt able to follow exercise prescription today without complaint.  Will continue to monitor for progression. ° ° ° °Dr. Mark Miller is Medical Director for [facility].  °Dr. Fuad Aleskerov is Medical Director for [facility]. °

## 2022-01-12 ENCOUNTER — Other Ambulatory Visit: Payer: Self-pay

## 2022-01-12 ENCOUNTER — Encounter: Payer: Self-pay | Admitting: Pulmonary Disease

## 2022-01-12 ENCOUNTER — Ambulatory Visit (INDEPENDENT_AMBULATORY_CARE_PROVIDER_SITE_OTHER): Payer: Medicare HMO | Admitting: Pulmonary Disease

## 2022-01-12 VITALS — BP 118/78 | HR 59 | Temp 97.8°F | Ht 71.0 in | Wt 218.2 lb

## 2022-01-12 DIAGNOSIS — Z87891 Personal history of nicotine dependence: Secondary | ICD-10-CM

## 2022-01-12 DIAGNOSIS — J9611 Chronic respiratory failure with hypoxia: Secondary | ICD-10-CM

## 2022-01-12 DIAGNOSIS — J449 Chronic obstructive pulmonary disease, unspecified: Secondary | ICD-10-CM | POA: Diagnosis not present

## 2022-01-12 DIAGNOSIS — J4489 Other specified chronic obstructive pulmonary disease: Secondary | ICD-10-CM

## 2022-01-12 NOTE — Patient Instructions (Signed)
We are very proud of you.  Continue your rehab.  Continue your nebulizer medications.   We will see you in follow-up in 3 months time call sooner should any new problems arise.

## 2022-01-12 NOTE — Progress Notes (Unsigned)
Subjective:    Patient ID: Bradley Sawyer, male    DOB: [birth year], 66 y.o.   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  Patient is oxygen dependent 24/7 due to chronic respiratory failure with hypoxia. He is on 3 L/min nasal cannula O2 via POC.  He was last seen on 09 October 2021 by me.  At that time he was referred to pulmonary rehab.  He has been participating in pulmonary rehab and has noted improvement.  Today he presents for a scheduled visit.  He is currently maintained on Brovana, Pulmicort and Yupelri via nebulizer.  He is actually doing fairly well with the nebulizer therapy as he does not have enough breath-holding capacity to use MDIs or DPI's effectively.  He has not had any recent exacerbations.  He is considering all these options. He has not had any fevers, chills or sweats.  Remarkably, he has not had any recent issues with wheezing/bronchospasm.  He has had no chest pain, lower extremity edema, orthopnea or paroxysmal nocturnal dyspnea.  No calf tenderness or edema.  No palpitations.   He has known grade 2 diastolic dysfunction, he has had a left heart cath but may require right heart cath to evaluate for potential pulmonary hypertension.  His dyspnea is out of proportion to his documented FEV1.  Alpha-1 phenotype is normal.   Review of Systems A 10 point review of systems was performed and it is as noted above otherwise negative.  Patient Active Problem List   Diagnosis Date Noted   Chronic respiratory failure with hypoxia ([place]) 03/30/2021   COPD exacerbation ([place]) 02/27/2021   History of CVA (cerebrovascular accident) 02/27/2021    Chronic pain, radicular 02/27/2021   Chronic, continuous use of opioids 02/27/2021   Unstable angina ([place]) 02/19/2021   Cigarette nicotine dependence without complication [id number]   Acute lower UTI 02/19/2017   UTI (urinary tract infection) 02/19/2017   Left inguinal hernia 08/12/2016   Elevated troponin 05/08/2016   Anal fissure 03/15/2016   External hemorrhoid 03/15/2016   Acute viral pharyngitis 03/20/2015   Olecranon bursitis of left elbow [id number]   Embolic stroke ([place]) [id number]   Carotid stenosis 10/15/2014   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   PAD (peripheral artery disease) ([place]) 08/23/2014   Preventative health care 08/12/2014   Occlusion and stenosis of carotid artery without mention of cerebral infarction 07/25/2014   Subclavian steal syndrome 07/25/2014   CAD (coronary artery disease) 07/10/2014   Nocturia 04/17/2014   Testicular pain, right 04/17/2014   Urethral stricture 04/17/2014   CVA (cerebral infarction) 04/10/2014   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   Hematuria 08/06/2013   Compression fracture of L2, with back and right groin pain 07/18/2013   History of colon polyps 05/18/2012   COPD (chronic obstructive pulmonary disease) ([place]) 12/24/2009   Hyperlipemia 09/20/2006   TOBACCO ABUSE 09/20/2006   Essential hypertension 09/20/2006   GERD 09/20/2006   Social History   Tobacco Use   Smoking status: Former    Packs/day: 2.00    Years: 48.00    Total pack years: 96.00  Types: Cigarettes    Quit date: 11/22/2020    Years since quitting: 2.1   Smokeless tobacco: Never  Substance Use Topics   Alcohol use: No    Alcohol/week: 0.0 standard drinks of alcohol   Allergies  Allergen Reactions   Bee Venom Swelling   Current Meds  Medication Sig   albuterol (PROVENTIL) (2.5 MG/3ML) 0.083% nebulizer solution USE 1 AMPULE IN NEBULIZER EVERY 6 HOURS AS NEEDED.    amLODipine (NORVASC) 5 MG tablet Take 5 mg by mouth daily.   aspirin EC 81 MG tablet Take 81 mg by mouth daily.   atorvastatin (LIPITOR) 80 MG tablet Take 80 mg by mouth daily.   clopidogrel (PLAVIX) 75 MG tablet Take 75 mg by mouth daily.   FLUoxetine (PROZAC) 20 MG capsule Take 20 mg by mouth daily.   isosorbide mononitrate (IMDUR) 30 MG 24 hr tablet Take 30 mg by mouth daily.   levocetirizine (XYZAL) 5 MG tablet Take 5 mg by mouth at bedtime.   linaclotide (LINZESS) 290 MCG CAPS capsule Take 290 mcg by mouth daily before breakfast.   lisinopril-hydrochlorothiazide (ZESTORETIC) 20-25 MG tablet Take 1 tablet by mouth daily.   metFORMIN (GLUCOPHAGE) 500 MG tablet Take 500 mg by mouth daily.   montelukast (SINGULAIR) 10 MG tablet Take 10 mg by mouth daily.   Multiple Vitamin (MULTI-VITAMINS) TABS Take 1 tablet by mouth daily.   naloxone (NARCAN) nasal spray 4 mg/0.1 mL Place 0.4 mg into the nose once.   nitroGLYCERIN (NITROSTAT) 0.4 MG SL tablet Place 0.4 mg under the tongue every 5 (five) minutes as needed for chest pain.   oxyCODONE (ROXICODONE) 15 MG immediate release tablet Take by mouth.   pantoprazole (PROTONIX) 40 MG tablet Take 1 tablet by mouth daily.   polyethylene glycol (MIRALAX / GLYCOLAX) packet Take 17 g by mouth daily as needed (for constipation).   pregabalin (LYRICA) 150 MG capsule Take 150 mg by mouth daily.   PROAIR HFA 108 (90 BASE) MCG/ACT inhaler INHALE 2 PUFFS INTO THE LUNGS EVERY 6 (SIX) HOURS AS NEEDED FOR WHEEZING. (Patient taking differently: Inhale 2 puffs into the lungs every 6 (six) hours as needed for shortness of breath or wheezing.)   rOPINIRole (REQUIP) 1 MG tablet Take 1 tablet by mouth at bedtime.   spironolactone (ALDACTONE) 25 MG tablet Take 25 mg by mouth daily.   budesonide (PULMICORT) 0.25 MG/2ML nebulizer solution Take 2 mLs (0.25 mg total) by nebulization 2 (two) times daily.   formoterol (PERFOROMIST) 20 MCG/2ML nebulizer solution Take 2 mLs (20  mcg total) by nebulization 2 (two) times daily.   YUPELRI 175 MCG/3ML nebulizer solution Inhale 3 mLs (175 mcg total) into the lungs daily.   Immunization History  Administered Date(s) Administered   DTaP 12/04/2018   Fluad Quad(high Dose 65+) 10/08/2021   Influenza, High Dose Seasonal PF 10/08/2021   Influenza, Seasonal, Injecte, Preservative Fre 08/01/2013, 08/12/2014, 08/26/2015, 08/06/2016, 09/26/2019   Influenza,inj,Quad PF,6+ Mos 08/01/2013, 08/12/2014   Influenza-Unspecified 10/30/2018, 09/26/2019, 09/10/2020   Moderna Sars-Covid-2 Vaccination 02/06/2020, 03/05/2020, 12/12/2020   Pneumococcal Polysaccharide-23 10/20/2014   Pneumococcal-Unspecified 10/20/2014        Objective:   Physical Exam BP 118/78 (BP Location: Left Arm, Patient Position: Sitting, Cuff Size: Normal)   Pulse (!) 59   Temp 97.8 F (36.6 C) (Oral)   Ht 5' 11"$  (1.803 m)   Wt 218 lb 3.2 oz (99 kg)   SpO2 95%   BMI 30.43 kg/m   SpO2: 95 % O2 Device:  Nasal cannula O2 Flow Rate (L/min): 3 L/min O2 Type: Pulse O2  GENERAL: Well-developed, overweight gentleman, no acute distress.  Somewhat disheveled.  Comfortable on nasal cannula/POC at 3 L/min with conserving device. HEAD: Normocephalic, atraumatic. EYES: Pupils equal, round, reactive to light.  No scleral icterus. MOUTH: Nose/mouth/throat not examined due to masking requirements for COVID 19. NECK: Supple. No thyromegaly. Trachea midline. No JVD.  No adenopathy. PULMONARY: Good air entry bilaterally.  Coarse breath sounds, no wheezes noted CARDIOVASCULAR: S1 and S2. Regular rate and rhythm.  No rubs, murmurs or gallops heard. ABDOMEN: Benign. MUSCULOSKELETAL: No joint deformity, no clubbing, no edema. NEUROLOGIC: No focal deficits, no gait disturbance.  Speech is fluent. SKIN: Intact,warm,dry. PSYCH: Mood and behavior normal.     Assessment & Plan:     ICD-10-CM   1. Stage 3 severe COPD by GOLD classification ([place])  J44.9    Continue current  nebulizer regimen Complete pulmonary rehab    2. COPD with asthma ([place])  J44.9    No recent asthmatic exacerbations    3. Chronic respiratory failure with hypoxia ([facility])  J96.11    Compliant with oxygen at 3 L/min Doing well with this regimen    4. Former heavy cigarette smoker (20-39 per day)  Z87.891    No evidence of relapse     Overall Mar[place] appears to be doing well.  We will see him in follow-up in 3 months time he is to call sooner should any new problems arise.  Renold Don, MD Advanced Bronchoscopy PCCM [facility]    *This note was dictated using voice recognition software/Dragon.  Despite best efforts to proofread, errors can occur which can change the meaning. Any transcriptional errors that result from this process are unintentional and may not be fully corrected at the time of dictation.

## 2022-01-13 ENCOUNTER — Encounter: Payer: Self-pay | Admitting: *Deleted

## 2022-01-13 DIAGNOSIS — J449 Chronic obstructive pulmonary disease, unspecified: Secondary | ICD-10-CM

## 2022-01-13 DIAGNOSIS — R0600 Dyspnea, unspecified: Secondary | ICD-10-CM

## 2022-01-13 NOTE — Progress Notes (Signed)
Pulmonary Individual Treatment Plan  Patient Details  Name: Bradley Sawyer MRN: [id number] Date of Birth: 1956-06-01 Referring Provider:   Flowsheet Row Pulmonary Rehab from 11/26/2021 in [facility]  Referring Provider Vernard Gambles MD       Initial Encounter Date:  Flowsheet Row Pulmonary Rehab from 11/26/2021 in [facility] Card[facility] and Pulmonary Rehab  Date 11/26/21       Visit Diagnosis: Stage 3 severe COPD by GOLD classification ([place])  Dyspnea, unspecified type  Patient's Home Medications on Admission:  Current Outpatient Medications:    albuterol (PROVENTIL) (2.5 MG/3ML) 0.083% nebulizer solution, USE 1 AMPULE IN NEBULIZER EVERY 6 HOURS AS NEEDED., Disp: 75 mL, Rfl: 0   amLODipine (NORVASC) 5 MG tablet, Take 5 mg by mouth daily., Disp: , Rfl:    aspirin EC 81 MG tablet, Take 81 mg by mouth daily., Disp: , Rfl:    atorvastatin (LIPITOR) 80 MG tablet, Take 80 mg by mouth daily., Disp: , Rfl:    budesonide (PULMICORT) 0.25 MG/2ML nebulizer solution, Take 2 mLs (0.25 mg total) by nebulization 2 (two) times daily., Disp: 120 mL, Rfl: 11   clopidogrel (PLAVIX) 75 MG tablet, Take 75 mg by mouth daily., Disp: , Rfl:    EPINEPHrine 0.3 mg/0.3 mL IJ SOAJ injection, Inject 0.3 mg into the muscle as needed for anaphylaxis., Disp: , Rfl:    FLUoxetine (PROZAC) 20 MG capsule, Take 20 mg by mouth daily., Disp: , Rfl:    formoterol (PERFOROMIST) 20 MCG/2ML nebulizer solution, Take 2 mLs (20 mcg total) by nebulization 2 (two) times daily., Disp: 120 mL, Rfl: 11   isosorbide mononitrate (IMDUR) 30 MG 24 hr tablet, Take 30 mg by mouth daily., Disp: , Rfl:    levocetirizine (XYZAL) 5 MG tablet, Take 5 mg by mouth at bedtime., Disp: , Rfl:    linaclotide (LINZESS) 290 MCG CAPS capsule, Take 290 mcg by mouth daily before breakfast., Disp: , Rfl:    lisinopril-hydrochlorothiazide (ZESTORETIC) 20-25 MG tablet, Take 1 tablet by mouth daily., Disp: , Rfl:    meloxicam (MOBIC) 15 MG  tablet, TAKE 1 TABLET IN THE MORNING WITH FOOD, Disp: , Rfl:    metFORMIN (GLUCOPHAGE) 500 MG tablet, Take 500 mg by mouth daily., Disp: , Rfl:    montelukast (SINGULAIR) 10 MG tablet, Take 10 mg by mouth daily., Disp: , Rfl:    Multiple Vitamin (MULTI-VITAMINS) TABS, Take 1 tablet by mouth daily., Disp: , Rfl:    naloxone (NARCAN) nasal spray 4 mg/0.1 mL, Place 0.4 mg into the nose once., Disp: , Rfl:    nitroGLYCERIN (NITROSTAT) 0.4 MG SL tablet, Place 0.4 mg under the tongue every 5 (five) minutes as needed for chest pain., Disp: , Rfl:    Olopatadine HCl 0.2 % SOLN, INSTILL 1 DROPS INTO EACH EYE ONCE DAILY AS NEEDED FOR ITCHY EYES., Disp: , Rfl:    oxyCODONE (ROXICODONE) 15 MG immediate release tablet, Take by mouth., Disp: , Rfl:    pantoprazole (PROTONIX) 40 MG tablet, Take 1 tablet by mouth daily., Disp: , Rfl:    polyethylene glycol (MIRALAX / GLYCOLAX) packet, Take 17 g by mouth daily as needed (for constipation)., Disp: , Rfl:    potassium chloride SA (KLOR-CON) 20 MEQ tablet, Take 1 tablet (20 mEq total) by mouth daily for 3 days., Disp: 3 tablet, Rfl: 0   predniSONE (STERAPRED UNI-PAK 21 TAB) 10 MG (21) TBPK tablet, Take as directed in the package, Disp: 21 tablet, Rfl: 0  pregabalin (LYRICA) 150 MG capsule, Take 150 mg by mouth daily., Disp: , Rfl:    PROAIR HFA 108 (90 BASE) MCG/ACT inhaler, INHALE 2 PUFFS INTO THE LUNGS EVERY 6 (SIX) HOURS AS NEEDED FOR WHEEZING. (Patient taking differently: Inhale 2 puffs into the lungs every 6 (six) hours as needed for shortness of breath or wheezing.), Disp: 22.5 Inhaler, Rfl: 1   rOPINIRole (REQUIP) 1 MG tablet, Take 1 tablet by mouth at bedtime., Disp: , Rfl:    spironolactone (ALDACTONE) 25 MG tablet, Take 25 mg by mouth daily., Disp: , Rfl:    tamsulosin (FLOMAX) 0.4 MG CAPS capsule, TAKE (1) CAPSULE BY MOUTH ONCE DAILY., Disp: 30 capsule, Rfl: 11   YUPELRI 175 MCG/3ML nebulizer solution, Inhale 3 mLs (175 mcg total) into the lungs daily.,  Disp: 90 mL, Rfl: 11 No current facility-administered medications for this visit.  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   Compression fracture    L2   COPD (chronic obstructive pulmonary disease) (HCC)    bronchitis   CVA (cerebral infarction)    Disorder of vocal cord    Fatty liver    GERD (gastroesophageal reflux disease)    Hx of cardiovascular stress test    ETT/Lexiscan-Myoview (10/15):  Inferior, inferoapical, apical lateral defect c/w mild ischemia, EF 66%; Intermediate Risk   Hyperlipidemia    Hypertension    Right shoulder pain    2/2 partial rotator cuff tear, tendinopathy, mild subacromial/subdeltioid bursitis, A/C joint arthropathy per MRI done in 3/07   Tobacco abuse     Tobacco Use: Social History   Tobacco Use  Smoking Status Former   Packs/day: 2.00   Years: 48.00   Pack years: 96.00   Types: Cigarettes   Quit date: 11/22/2020   Years since quitting: 1.1  Smokeless Tobacco Never    Labs: Recent Review Flowsheet Data     Labs for ITP Cardiac and Pulmonary Rehab Latest Ref Rng & Units 10/04/2014 10/15/2014 02/11/2015 02/12/2015 05/08/2016   Cholestrol 0 - 200 mg/dL - - 179 - -   LDLCALC 0 - 99 mg/dL - - NOT CALC - -   LDLDIRECT mg/dL - - - 94 -   HDL >=40 mg/dL - - 36(L) - -   Trlycerides <150 mg/dL - - 402(H) - -   Hemoglobin A1c 4.0 - 6.0 % - - - - 5.8   PHART 7.350 - 7.450 7.421 7.366 - - -   PCO2ART 35.0 - 45.0 mmHg 39.8 49.7(H) - - -   HCO3 20.0 - 24.0 mEq/L 25.4(H) 28.2(H) - - -   TCO2 0 - 100 mmol/L 26.6 30 - - -   O2SAT % 92.6 97.0 - - -        Pulmonary Assessment Scores:  Pulmonary Assessment Scores      Row Name 11/26/21 1140         ADL UCSD   ADL Phase Entry     SOB Score total 39     Rest 0     Walk 3     Stairs 5     Bath 5     Dress 0  Shop 0  Patient wrote "N/A"       CAT Score   CAT Score 29       mMRC Score   mMRC Score 4              UCSD: Self-administered rating of dyspnea associated with activities of daily living (ADLs) 6-point scale (0 = "not at all" to 5 = "maximal or unable to do because of breathlessness")  Scoring Scores range from 0 to 120.  Minimally important difference is 5 units  CAT: CAT can identify the health impairment of COPD patients and is better correlated with disease progression.  CAT has a scoring range of zero to 40. The CAT score is classified into four groups of low (less than 10), medium (10 - 20), high (21-30) and very high (31-40) based on the impact level of disease on health status. A CAT score over 10 suggests significant symptoms.  A worsening CAT score could be explained by an exacerbation, poor medication adherence, poor inhaler technique, or progression of COPD or comorbid conditions.  CAT MCID is 2 points  mMRC: mMRC (Modified Medical Research Council) Dyspnea Scale is used to assess the degree of baseline functional disability in patients of respiratory disease due to dyspnea. No minimal important difference is established. A decrease in score of 1 point or greater is considered a positive change.   Pulmonary Function Assessment:  Pulmonary Function Assessment - 11/26/21 1137       Pulmonary Function Tests   FVC% 66 %    FEV1% 29 %    FEV1/FVC Ratio 33             Exercise Target Goals: Exercise Program Goal: Individual exercise prescription set using results from initial 6 min walk test and THRR while considering  patients activity barriers and safety.   Exercise Prescription Goal: Initial exercise prescription builds to 30-45 minutes a day of aerobic activity, 2-3 days per week.  Home exercise  guidelines will be given to patient during program as part of exercise prescription that the participant will acknowledge.  Education: Aerobic Exercise: - Group verbal and visual presentation on the components of exercise prescription. Introduces F.I.T.T principle from ACSM for exercise prescriptions.  Reviews F.I.T.T. principles of aerobic exercise including progression. Written material given at graduation.   Education: Resistance Exercise: - Group verbal and visual presentation on the components of exercise prescription. Introduces F.I.T.T principle from ACSM for exercise prescriptions  Reviews F.I.T.T. principles of resistance exercise including progression. Written material given at graduation.    Education: Exercise & Equipment Safety: - Individual verbal instruction and demonstration of equipment use and safety with use of the equipment. Flowsheet Row Pulmonary Rehab from 01/06/2022 in [facility] Cardiac and Pulmonary Rehab  Education need identified 11/26/21  Date 11/26/21  Educator [place]  Instruction Review Code 1- Verbalizes Understanding       Education: Exercise Physiology & General Exercise Guidelines: - Group verbal and written instruction with models to review the exercise physiology of the cardiovascular system and associated critical values. Provides general exercise guidelines with specific guidelines to those with heart or lung disease.    Education: Flexibility, Balance, Mind/Body Relaxation: - Group verbal and visual presentation with interactive activity on the components of exercise prescription. Introduces F.I.T.T principle from ACSM for exercise prescriptions. Reviews F.I.T.T. principles of flexibility and balance exercise training including progression. Also discusses the mind body connection.  Reviews various relaxation techniques to help reduce and manage stress (i.e. Deep breathing, progressive muscle  relaxation, and visualization). Balance handout provided to take home.  Written material given at graduation.   Activity Barriers & Risk Stratification:  Activity Barriers & Cardiac Risk Stratification - 11/26/21 1420       Activity Barriers & Cardiac Risk Stratification   Activity Barriers Back Problems;Joint Problems;Shortness of Breath;Deconditioning;Muscular [employer] Device;Other (comment)    Comments Chronic pain all over- being managed by pain clinic             6 Minute Walk:  6 Minute Walk     Row Name 11/26/21 1156         6 Minute Walk   Phase Initial     Distance 1000 feet     Walk Time 6 minutes     # of Rest Breaks 0     MPH 1.89     METS 2.24     RPE 15     Perceived Dyspnea  3     VO2 Peak 7.84     Symptoms Yes (comment)     Comments SOB, buttock pain 7/10     Resting HR 76 bpm     Resting BP 90/58     Resting Oxygen Saturation  94 %     Exercise Oxygen Saturation  during 6 min walk 93 %     Max Ex. HR 104 bpm     Max Ex. BP 98/56     2 Minute Post BP 94/52       Interval HR   1 Minute HR 92     2 Minute HR 96     3 Minute HR 103     4 Minute HR 99     5 Minute HR 96     6 Minute HR 104     2 Minute Post HR 83     Interval Heart Rate? Yes       Interval Oxygen   Interval Oxygen? Yes     Baseline Oxygen Saturation % 94 %     1 Minute Oxygen Saturation % 94 %     1 Minute Liters of Oxygen 3 L     2 Minute Oxygen Saturation % 93 %     2 Minute Liters of Oxygen 3 L     3 Minute Oxygen Saturation % 95 %     3 Minute Liters of Oxygen 3 L     4 Minute Oxygen Saturation % 94 %     4 Minute Liters of Oxygen 3 L     5 Minute Oxygen Saturation % 95 %     5 Minute Liters of Oxygen 3 L     6 Minute Oxygen Saturation % 96 %     6 Minute Liters of Oxygen 3 L     2 Minute Post Oxygen Saturation % 95 %     2 Minute Post Liters of Oxygen 3 L             Oxygen Initial Assessment:  Oxygen Initial Assessment - 11/26/21 1425       Home Oxygen   Home Oxygen Device Home Concentrator;Portable  Concentrator;E-Tanks    Sleep Oxygen Prescription Continuous    Liters per minute 3    Home Exercise Oxygen Prescription Continuous    Liters per minute 3    Home Resting Oxygen Prescription Continuous    Liters per minute 3    Compliance with Home Oxygen Use Yes      Initial 6 min Walk  Oxygen Used Continuous    Liters per minute 3   could use 4 if needed     Intervention   Short Term Goals To learn and exhibit compliance with exercise, home and travel O2 prescription;To learn and understand importance of monitoring SPO2 with pulse oximeter and demonstrate accurate use of the pulse oximeter.;To learn and understand importance of maintaining oxygen saturations>88%;To learn and demonstrate proper pursed lip breathing techniques or other breathing techniques. ;To learn and demonstrate proper use of respiratory medications    Long  Term Goals Exhibits compliance with exercise, home  and travel O2 prescription;Verbalizes importance of monitoring SPO2 with pulse oximeter and return demonstration;Maintenance of O2 saturations>88%;Exhibits proper breathing techniques, such as pursed lip breathing or other method taught during program session;Compliance with respiratory medication;Demonstrates proper use of MDIs             Oxygen Re-Evaluation:  Oxygen Re-Evaluation     Row Name 12/21/21 1109 01/01/22 1125           Program Oxygen Prescription   Program Oxygen Prescription -- Continuous      Liters per minute -- 3        Home Oxygen   Home Oxygen Device -- Home Concentrator;Portable Concentrator;E-Tanks      Sleep Oxygen Prescription -- Continuous      Liters per minute -- 3      Home Exercise Oxygen Prescription -- Continuous      Liters per minute -- 3      Home Resting Oxygen Prescription -- Continuous      Liters per minute -- 3      Compliance with Home Oxygen Use -- Yes        Goals/Expected Outcomes   Short Term Goals -- To learn and understand importance of  maintaining oxygen saturations>88%;To learn and understand importance of monitoring SPO2 with pulse oximeter and demonstrate accurate use of the pulse oximeter.      Long  Term Goals -- Maintenance of O2 saturations>88%;Verbalizes importance of monitoring SPO2 with pulse oximeter and return demonstration      Comments Reviewed PLB technique with pt.  Talked about how it works and it's importance in maintaining their exercise saturations. Sam has a pulse oximeter to check his oxygen saturation at home. Informed and explained why it is important to have one. Reviewed that oxygen saturations should be 88 percent and above.      Goals/Expected Outcomes Short: Become more profiecient at using PLB.   Long: Become independent at using PLB. Short: monitor oxygen at home with exertion. Long: maintain oxygen saturations above 88 percent independently.               Oxygen Discharge (Final Oxygen Re-Evaluation):  Oxygen Re-Evaluation - 01/01/22 1125       Program Oxygen Prescription   Program Oxygen Prescription Continuous    Liters per minute 3      Home Oxygen   Home Oxygen Device Home Concentrator;Portable Concentrator;E-Tanks    Sleep Oxygen Prescription Continuous    Liters per minute 3    Home Exercise Oxygen Prescription Continuous    Liters per minute 3    Home Resting Oxygen Prescription Continuous    Liters per minute 3    Compliance with Home Oxygen Use Yes      Goals/Expected Outcomes   Short Term Goals To learn and understand importance of maintaining oxygen saturations>88%;To learn and understand importance of monitoring SPO2 with pulse oximeter and demonstrate accurate use of the  pulse oximeter.    Long  Term Goals Maintenance of O2 saturations>88%;Verbalizes importance of monitoring SPO2 with pulse oximeter and return demonstration    Comments Sam has a pulse oximeter to check his oxygen saturation at home. Informed and explained why it is important to have one. Reviewed that  oxygen saturations should be 88 percent and above.    Goals/Expected Outcomes Short: monitor oxygen at home with exertion. Long: maintain oxygen saturations above 88 percent independently.             Initial Exercise Prescription:  Initial Exercise Prescription - 11/26/21 1400       Date of Initial Exercise RX and Referring Provider   Date 11/26/21    Referring Provider Vernard Gambles MD      Oxygen   Oxygen Continuous    Liters 4   Ranges 3-4   Maintain Oxygen Saturation 88% or higher      REL-XR   Level 1    Speed 50    Minutes 15    METs 2.2      Biostep-RELP   Level 1    SPM 50    Minutes 15    METs 2.2      Track   Laps 18    Minutes 15    METs 1.98      Prescription Details   Frequency (times per week) 2    Duration Progress to 30 minutes of continuous aerobic without signs/symptoms of physical distress      Intensity   THRR 40-80% of Max Heartrate 107-139    Ratings of Perceived Exertion 11-13    Perceived Dyspnea 0-4      Progression   Progression Continue to progress workloads to maintain intensity without signs/symptoms of physical distress.      Resistance Training   Training Prescription Yes    Weight 3 lb    Reps 10-15             Perform Capillary Blood Glucose checks as needed.  Exercise Prescription Changes:   Exercise Prescription Changes     Row Name 11/26/21 1400 12/30/21 1700 01/12/22 1300         Response to Exercise   Blood Pressure (Admit) 90/58 116/62 122/60     Blood Pressure (Exercise) 98/56 132/72 128/64     Blood Pressure (Exit) 94/52 110/60 102/60     Heart Rate (Admit) 76 bpm 87 bpm 73 bpm     Heart Rate (Exercise) 104 bpm 104 bpm 92 bpm     Heart Rate (Exit) 83 bpm 90 bpm 75 bpm     Oxygen Saturation (Admit) 94 % 96 % 95 %     Oxygen Saturation (Exercise) 93 % 91 % 97 %     Oxygen Saturation (Exit) 95 % 98 % 95 %     Rating of Perceived Exertion (Exercise) '15 15 15     ' Perceived Dyspnea (Exercise) '3 3  3     ' Symptoms SOB, buttock pain 7/10 SOB SOB     Comments walk test results 2nd full day of exercise --     Duration -- Progress to 30 minutes of  aerobic without signs/symptoms of physical distress Progress to 30 minutes of  aerobic without signs/symptoms of physical distress     Intensity -- THRR unchanged THRR unchanged       Progression   Progression -- Continue to progress workloads to maintain intensity without signs/symptoms of physical distress. Continue to progress workloads to  maintain intensity without signs/symptoms of physical distress.     Average METs -- 2.31 2       Resistance Training   Training Prescription -- Yes Yes     Weight -- 3 lb 3 lb     Reps -- 10-15 10-15       Interval Training   Interval Training -- No No       Oxygen   Oxygen -- Continuous Continuous     Liters -- 4 3       Recumbant Bike   Level -- -- 1     Minutes -- -- 15       REL-XR   Level -- 1 --     Minutes -- 15 --     METs -- 3 --       Biostep-RELP   Level -- 1 --     Minutes -- 15 --     METs -- 3 --       Track   Laps -- 13 20     Minutes -- 15 15     METs -- 1.71 2.09       Oxygen   Maintain Oxygen Saturation -- 88% or higher 88% or higher              Exercise Comments:   Exercise Goals and Review:   Exercise Goals     Row Name 11/26/21 1423             Exercise Goals   Increase Physical Activity Yes       Intervention Provide advice, education, support and counseling about physical activity/exercise needs.;Develop an individualized exercise prescription for aerobic and resistive training based on initial evaluation findings, risk stratification, comorbidities and participant's personal goals.       Expected Outcomes Short Term: Attend rehab on a regular basis to increase amount of physical activity.;Long Term: Add in home exercise to make exercise part of routine and to increase amount of physical activity.;Long Term: Exercising regularly at least 3-5  days a week.       Increase Strength and Stamina Yes       Intervention Provide advice, education, support and counseling about physical activity/exercise needs.;Develop an individualized exercise prescription for aerobic and resistive training based on initial evaluation findings, risk stratification, comorbidities and participant's personal goals.       Expected Outcomes Short Term: Perform resistance training exercises routinely during rehab and add in resistance training at home;Short Term: Increase workloads from initial exercise prescription for resistance, speed, and METs.;Long Term: Improve cardiorespiratory fitness, muscular endurance and strength as measured by increased METs and functional capacity (6MWT)       Able to understand and use rate of perceived exertion (RPE) scale Yes       Intervention Provide education and explanation on how to use RPE scale       Expected Outcomes Short Term: Able to use RPE daily in rehab to express subjective intensity level;Long Term:  Able to use RPE to guide intensity level when exercising independently       Able to understand and use Dyspnea scale Yes       Intervention Provide education and explanation on how to use Dyspnea scale       Expected Outcomes Short Term: Able to use Dyspnea scale daily in rehab to express subjective sense of shortness of breath during exertion;Long Term: Able to use Dyspnea scale to guide intensity level when exercising independently  Knowledge and understanding of Target Heart Rate Range (THRR) Yes       Intervention Provide education and explanation of THRR including how the numbers were predicted and where they are located for reference       Expected Outcomes Short Term: Able to state/look up THRR;Long Term: Able to use THRR to govern intensity when exercising independently;Short Term: Able to use daily as guideline for intensity in rehab       Able to check pulse independently Yes       Intervention Provide  education and demonstration on how to check pulse in carotid and radial arteries.;Review the importance of being able to check your own pulse for safety during independent exercise       Expected Outcomes Short Term: Able to explain why pulse checking is important during independent exercise;Long Term: Able to check pulse independently and accurately       Understanding of Exercise Prescription Yes       Intervention Provide education, explanation, and written materials on patient's individual exercise prescription       Expected Outcomes Short Term: Able to explain program exercise prescription;Long Term: Able to explain home exercise prescription to exercise independently                Exercise Goals Re-Evaluation :  Exercise Goals Re-Evaluation     Row Name 12/21/21 1109 12/30/21 1749 01/12/22 1329         Exercise Goal Re-Evaluation   Exercise Goals Review Increase Physical Activity;Able to understand and use rate of perceived exertion (RPE) scale;Knowledge and understanding of Target Heart Rate Range (THRR);Understanding of Exercise Prescription;Increase Strength and Stamina;Able to understand and use Dyspnea scale;Able to check pulse independently Increase Physical Activity;Increase Strength and Stamina Increase Physical Activity;Increase Strength and Stamina     Comments Reviewed RPE and dyspnea scales, THR and program prescription with pt today.  Pt voiced understanding and was given a copy of goals to take home. Sam is doing well the first couple of sessions that he has been here. His oxygen saturations are staying above 88% and is tolerating exercise well. Walking is a bit difficult for him due to his breathing and hope to see the improve over time. Will continue to monitor. Sam has worked up to 20 laps on the track.  His sats stayed in the mid 90s with 3L O2.  He should be ready to increase levels on seated machines.     Expected Outcomes Short: Use RPE daily to regulate intensity.  Long: Follow program prescription in THR. Short: Increase tolerance on track Long: Continue to increase overall MET level Short: increase to level 2 on seated machines Long:  continue to build stamina              Discharge Exercise Prescription (Final Exercise Prescription Changes):  Exercise Prescription Changes - 01/12/22 1300       Response to Exercise   Blood Pressure (Admit) 122/60    Blood Pressure (Exercise) 128/64    Blood Pressure (Exit) 102/60    Heart Rate (Admit) 73 bpm    Heart Rate (Exercise) 92 bpm    Heart Rate (Exit) 75 bpm    Oxygen Saturation (Admit) 95 %    Oxygen Saturation (Exercise) 97 %    Oxygen Saturation (Exit) 95 %    Rating of Perceived Exertion (Exercise) 15    Perceived Dyspnea (Exercise) 3    Symptoms SOB    Duration Progress to 30 minutes of  aerobic  without signs/symptoms of physical distress    Intensity THRR unchanged      Progression   Progression Continue to progress workloads to maintain intensity without signs/symptoms of physical distress.    Average METs 2      Resistance Training   Training Prescription Yes    Weight 3 lb    Reps 10-15      Interval Training   Interval Training No      Oxygen   Oxygen Continuous    Liters 3      Recumbant Bike   Level 1    Minutes 15      Track   Laps 20    Minutes 15    METs 2.09      Oxygen   Maintain Oxygen Saturation 88% or higher             Nutrition:  Target Goals: Understanding of nutrition guidelines, daily intake of sodium <1566m, cholesterol <202m calories 30% from fat and 7% or less from saturated fats, daily to have 5 or more servings of fruits and vegetables.  Education: All About Nutrition: -Group instruction provided by verbal, written material, interactive activities, discussions, models, and posters to present general guidelines for heart healthy nutrition including fat, fiber, MyPlate, the role of sodium in heart healthy nutrition, utilization of the  nutrition label, and utilization of this knowledge for meal planning. Follow up email sent as well. Written material given at graduation.   Biometrics:  Pre Biometrics - 11/26/21 1424       Pre Biometrics   Height 5' 10.25" (1.784 m)    Weight 220 lb (99.8 kg)    BMI (Calculated) 31.35              Nutrition Therapy Plan and Nutrition Goals:  Nutrition Therapy & Goals - 11/26/21 1426       Intervention Plan   Intervention Prescribe, educate and counsel regarding individualized specific dietary modifications aiming towards targeted core components such as weight, hypertension, lipid management, diabetes, heart failure and other comorbidities.    Expected Outcomes Short Term Goal: Understand basic principles of dietary content, such as calories, fat, sodium, cholesterol and nutrients.;Short Term Goal: A plan has been developed with personal nutrition goals set during dietitian appointment.;Long Term Goal: Adherence to prescribed nutrition plan.             Nutrition Assessments:  MEDIFICTS Score Key: ?70 Need to make dietary changes  40-70 Heart Healthy Diet ? 40 Therapeutic Level Cholesterol Diet  Flowsheet Row [facility]  Picture Your Plate Total Score on Admission 40      Picture Your Plate Scores: <4<70nhealthy dietary pattern with much room for improvement. 41-50 Dietary pattern unlikely to meet recommendations for good health and room for improvement. 51-60 More healthful dietary pattern, with some room for improvement.  >60 Healthy dietary pattern, although there may be some specific behaviors that could be improved.   Nutrition Goals Re-Evaluation:  Nutrition Goals Re-Evaluation     [place] 01/01/22 1129             Goals   Current Weight 216 lb (98 kg)       Nutrition Goal Eat healthier foods.       Comment Patient was informed on why it is important to maintain a balanced diet when dealing  with Respiratory issues. Explained that it takes a lot of energy to breath and when they are  short of breath often they will need to have a good diet to help keep up with the calories they are expending for breathing.       Expected Outcome Short: Choose and plan snacks accordingly to patients caloric intake to improve breathing. Long: Maintain a diet independently that meets their caloric intake to aid in daily shortness of breath.                Nutrition Goals Discharge (Final Nutrition Goals Re-Evaluation):  Nutrition Goals Re-Evaluation - 01/01/22 1129       Goals   Current Weight 216 lb (98 kg)    Nutrition Goal Eat healthier foods.    Comment Patient was informed on why it is important to maintain a balanced diet when dealing with Respiratory issues. Explained that it takes a lot of energy to breath and when they are short of breath often they will need to have a good diet to help keep up with the calories they are expending for breathing.    Expected Outcome Short: Choose and plan snacks accordingly to patients caloric intake to improve breathing. Long: Maintain a diet independently that meets their caloric intake to aid in daily shortness of breath.             Psychosocial: Target Goals: Acknowledge presence or absence of significant depression and/or stress, maximize coping skills, provide positive support system. Participant is able to verbalize types and ability to use techniques and skills needed for reducing stress and depression.   Education: Stress, Anxiety, and Depression - Group verbal and visual presentation to define topics covered.  Reviews how body is impacted by stress, anxiety, and depression.  Also discusses healthy ways to reduce stress and to treat/manage anxiety and depression.  Written material given at graduation. Flowsheet Row Pulmonary Rehab from 01/06/2022 in [facility] Cardiac and Pulmonary Rehab  Date 01/06/22  Educator [facility]  Instruction Review Code 1-  [employer] Understanding       Education: Sleep Hygiene -Provides group verbal and written instruction about how sleep can affect your health.  Define sleep hygiene, discuss sleep cycles and impact of sleep habits. Review good sleep hygiene tips.    Initial Review & Psychosocial Screening:  Initial Psych Review & Screening - 10/29/21 0933       Initial Review   Current issues with Current Psychotropic Meds;Current Depression;Current Anxiety/Panic;Current Stress Concerns   Anxiety manifests-mind racing, can't sit still   Source of Stress Concerns Chronic Illness;Family;Financial;Unable to participate in former interests or hobbies;Unable to perform yard/household activities    Comments Lives with son and his family son daughter in law and their 4 children      [place]? Yes   Son and his family     Barriers   Psychosocial barriers to participate in program The patient should benefit from training in stress management and relaxation.      Screening Interventions   Interventions Encouraged to exercise;To provide support and resources with identified psychosocial needs;Provide feedback about the scores to participant    Expected Outcomes Short Term goal: Utilizing psychosocial counselor, staff and physician to assist with identification of specific Stressors or current issues interfering with healing process. Setting desired goal for each stressor or current issue identified.;Long Term Goal: Stressors or current issues are controlled or eliminated.;Short Term goal: Identification and review with participant of any Quality of Life or Depression concerns found by scoring the questionnaire.;Long Term goal: The participant improves quality of Life  and PHQ9 Scores as seen by post scores and/or verbalization of changes             Quality of Life Scores:  Scores of 19 and below usually indicate a poorer quality of life in these areas.  A difference of  2-3  points is a clinically meaningful difference.  A difference of 2-3 points in the total score of the Quality of Life Index has been associated with significant improvement in overall quality of life, self-image, physical symptoms, and general health in studies assessing change in quality of life.  PHQ-9: Recent Review Flowsheet Data     Depression screen [facility] 2/9 01/01/2022 11/26/2021 03/19/2015 03/14/2015 02/11/2015   Decreased Interest 2 1 0 0  0   Down, Depressed, Hopeless 2 2 0 0 0    PHQ - 2 Score 4 3 0 0 0   Altered sleeping 1 3 - - -   Tired, decreased energy 1 2 - - -   Change in appetite 1 0 - - -   Feeling bad or failure about yourself  0 0 - - -   Trouble concentrating 0 0 - - -   Moving slowly or fidgety/restless 0 0 - - -   Suicidal thoughts 0 0 - - -   PHQ-9 Score 7 8 - - -   Difficult doing work/chores Somewhat difficult Somewhat difficult - - -      Interpretation of Total Score  Total Score Depression Severity:  1-4 = Minimal depression, 5-9 = Mild depression, 10-14 = Moderate depression, 15-19 = Moderately severe depression, 20-27 = Severe depression   Psychosocial Evaluation and Intervention:  Psychosocial Evaluation - 10/29/21 0950       Psychosocial Evaluation & Interventions   Interventions Encouraged to exercise with the program and follow exercise prescription;Stress management education    Comments Sam is ready to try the program. He  lives in [place] and it is a long drive for him. We discussed that 2 days a week is required to benefit from the program. He does want to see if he can attend. He has chronic pain from several sources, back plates and screws, hip pain, old shoulder injury. He does take pain medicaine on a daily basis to help him through the day. He lives with his son and his son's family (wife and 4 children ages 59-16) He does have a cat in the house.  He speaks that he does have stress. Not being able to do what he used to do. Being dependent on  pain medication. His son and family are his support system.    Expected Outcomes STG: Sam will be able to attend all scheduled sessions. He will learn and utilize Stress stragies LTG Sam will continue with his exercise progression and his Stress management    Continue Psychosocial Services  Follow up required by staff             Psychosocial Re-Evaluation:  Psychosocial Re-Evaluation     [place] Name 01/01/22 1134             Psychosocial Re-Evaluation   Current issues with History of Depression;Current Anxiety/Panic       Comments Reviewed patient health questionnaire (PHQ-9) with patient for follow up. Previously, patients score indicated signs/symptoms of depression.  Reviewed to see if patient is improving symptom wise while in program.  Score improved slightly. He states that being on oxygen is a big part of his depression. Informed patient that  he is here to see how his oxygen responds to exercise and that we will do what we can to titrate it. Patient verbalizes understanding.       Expected Outcomes Short: Continue to attend [facility] regularly for regular exercise and social engagement. Long: Continue to improve symptoms and manage a positive mental state.       Interventions Encouraged to attend Pulmonary Rehabilitation for the exercise       Continue Psychosocial Services  Follow up required by staff                Psychosocial Discharge (Final Psychosocial Re-Evaluation):  Psychosocial Re-Evaluation - 01/01/22 1134       Psychosocial Re-Evaluation   Current issues with History of Depression;Current Anxiety/Panic    Comments Reviewed patient health questionnaire (PHQ-9) with patient for follow up. Previously, patients score indicated signs/symptoms of depression.  Reviewed to see if patient is improving symptom wise while in program.  Score improved slightly. He states that being on oxygen is a big part of his depression. Informed patient that he is here to see how his  oxygen responds to exercise and that we will do what we can to titrate it. Patient verbalizes understanding.    Expected Outcomes Short: Continue to attend [facility] regularly for regular exercise and social engagement. Long: Continue to improve symptoms and manage a positive mental state.    Interventions Encouraged to attend Pulmonary Rehabilitation for the exercise    Continue Psychosocial Services  Follow up required by staff             Education: Education Goals: Education classes will be provided on a weekly basis, covering required topics. Participant will state understanding/return demonstration of topics presented.  Learning Barriers/Preferences:   General Pulmonary Education Topics:  Infection Prevention: - Provides verbal and written material to individual with discussion of infection control including proper hand washing and proper equipment cleaning during exercise session. Flowsheet Row Pulmonary Rehab from 01/06/2022 in [facility] Card[facility] and Pulmonary Rehab  Education need identified 11/26/21  Date 11/26/21  Educator [place]  Instruction Review Code 1- Verbalizes Understanding       Falls Prevention: - Provides verbal and written material to individual with discussion of falls prevention and safety. Flowsheet Row Pulmonary Rehab from 01/06/2022 in [facility] Cardiac and Pulmonary Rehab  Education need identified 11/26/21  Date 11/26/21  Educator [place]  Instruction Review Code 1- Verbalizes Understanding       Chronic Lung Disease Review: - Group verbal instruction with posters, models, PowerPoint presentations and videos,  to review new updates, new respiratory medications, new advancements in procedures and treatments. Providing information on websites and "800" numbers for continued self-education. Includes information about supplement oxygen, available portable oxygen systems, continuous and intermittent flow rates, oxygen safety, concentrators, and Medicare reimbursement for  oxygen. Explanation of Pulmonary Drugs, including class, frequency, complications, importance of spacers, rinsing mouth after steroid MDI's, and proper cleaning methods for nebulizers. Review of basic lung anatomy and physiology related to function, structure, and complications of lung disease. Review of risk factors. Discussion about methods for diagnosing sleep apnea and types of masks and machines for OSA. Includes a review of the use of types of environmental controls: home humidity, furnaces, filters, dust mite/pet prevention, HEPA vacuums. Discussion about weather changes, air quality and the benefits of nasal washing. Instruction on Warning signs, infection symptoms, calling MD promptly, preventive modes, and value of vaccinations. Review of effective airway clearance, coughing and/or vibration techniques. Emphasizing that all should  Create an Sports administrator. Written material given at graduation. Flowsheet Row Pulmonary Rehab from 01/06/2022 in [facility] Cardiac and Pulmonary Rehab  Education need identified 11/26/21  Date 12/30/21  Educator [facility]  Instruction Review Code 1- Verbalizes Understanding       AED/CPR: - Group verbal and written instruction with the use of models to demonstrate the basic use of the AED with the basic ABC's of resuscitation.    Anatomy and Cardiac Procedures: - Group verbal and visual presentation and models provide information about basic cardiac anatomy and function. Reviews the testing methods done to diagnose heart disease and the outcomes of the test results. Describes the treatment choices: Medical Management, Angioplasty, or Coronary Bypass Surgery for treating various heart conditions including Myocardial Infarction, Angina, Valve Disease, and Cardiac Arrhythmias.  Written material given at graduation. Flowsheet Row Pulmonary Rehab from 01/06/2022 in [facility] Cardiac and Pulmonary Rehab  Education need identified 11/26/21       Medication Safety: - Group verbal and  visual instruction to review commonly prescribed medications for heart and lung disease. Reviews the medication, class of the drug, and side effects. Includes the steps to properly store meds and maintain the prescription regimen.  Written material given at graduation.   Other: -Provides group and verbal instruction on various topics (see comments)   Knowledge Questionnaire Score:  Knowledge Questionnaire Score - 11/26/21 1425       Knowledge Questionnaire Score   Pre Score 15/18: A&P, Oxygen, ADL              Core Components/Risk Factors/Patient Goals at Admission:  Personal Goals and Risk Factors at Admission - 11/26/21 1424       Core Components/Risk Factors/Patient Goals on Admission    Weight Management Yes;Weight Loss    Intervention Weight Management: Develop a combined nutrition and exercise program designed to reach desired caloric intake, while maintaining appropriate intake of nutrient and fiber, sodium and fats, and appropriate energy expenditure required for the weight goal.    Admit Weight 220 lb (99.8 kg)    Goal Weight: Short Term 215 lb (97.5 kg)    Goal Weight: Long Term 210 lb (95.3 kg)    Expected Outcomes Short Term: Continue to assess and modify interventions until short term weight is achieved;Long Term: Adherence to nutrition and physical activity/exercise program aimed toward attainment of established weight goal;Weight Loss: Understanding of general recommendations for a balanced deficit meal plan, which promotes 1-2 lb weight loss per week and includes a negative energy balance of [phone] kcal/d;Understanding recommendations for meals to include 15-35% energy as protein, 25-35% energy from fat, 35-60% energy from carbohydrates, less than 237m of dietary cholesterol, 20-35 gm of total fiber daily;Understanding of distribution of calorie intake throughout the day with the consumption of 4-5 meals/snacks    Improve shortness of breath with ADL's Yes     Intervention Provide education, individualized exercise plan and daily activity instruction to help decrease symptoms of SOB with activities of daily living.    Expected Outcomes Short Term: Improve cardiorespiratory fitness to achieve a reduction of symptoms when performing ADLs;Long Term: Be able to perform more ADLs without symptoms or delay the onset of symptoms    Increase knowledge of respiratory medications and ability to use respiratory devices properly  Yes    Intervention Provide education and demonstration as needed of appropriate use of medications, inhalers, and oxygen therapy.    Expected Outcomes Short Term: Achieves understanding of medications use. Understands that oxygen is a medication  prescribed by physician. Demonstrates appropriate use of inhaler and oxygen therapy.;Long Term: Maintain appropriate use of medications, inhalers, and oxygen therapy.    Hypertension Yes    Intervention Provide education on lifestyle modifcations including regular physical activity/exercise, weight management, moderate sodium restriction and increased consumption of fresh fruit, vegetables, and low fat dairy, alcohol moderation, and smoking cessation.;Monitor prescription use compliance.    Expected Outcomes Short Term: Continued assessment and intervention until BP is < 140/64m HG in hypertensive participants. < 130/838mHG in hypertensive participants with diabetes, heart failure or chronic kidney disease.;Long Term: Maintenance of blood pressure at goal levels.    Lipids Yes    Intervention Provide education and support for participant on nutrition & aerobic/resistive exercise along with prescribed medications to achieve LDL <7022mHDL >68m1m  Expected Outcomes Short Term: Participant states understanding of desired cholesterol values and is compliant with medications prescribed. Participant is following exercise prescription and nutrition guidelines.;Long Term: Cholesterol controlled with  medications as prescribed, with individualized exercise RX and with personalized nutrition plan. Value goals: LDL < 70mg84mL > 40 mg.    Stress Yes    Intervention Offer individual and/or small group education and counseling on adjustment to heart disease, stress management and health-related lifestyle change. Teach and support self-help strategies.;Refer participants experiencing significant psychosocial distress to appropriate mental health specialists for further evaluation and treatment. When possible, include family members and significant others in education/counseling sessions.    Expected Outcomes Short Term: Participant demonstrates changes in health-related behavior, relaxation and other stress management skills, ability to obtain effective social support, and compliance with psychotropic medications if prescribed.;Long Term: Emotional wellbeing is indicated by absence of clinically significant psychosocial distress or social isolation.             Education:Diabetes - Individual verbal and written instruction to review signs/symptoms of diabetes, desired ranges of glucose level fasting, after meals and with exercise. Acknowledge that pre and post exercise glucose checks will be done for 3 sessions at entry of program.   Know Your Numbers and Heart Failure: - Group verbal and visual instruction to discuss disease risk factors for cardiac and pulmonary disease and treatment options.  Reviews associated critical values for Overweight/Obesity, Hypertension, Cholesterol, and Diabetes.  Discusses basics of heart failure: signs/symptoms and treatments.  Introduces Heart Failure Zone chart for action plan for heart failure.  Written material given at graduation. Flowsheet Row [facility]monary [facility]  Date 12/23/21  Educator [facility] Inctruction Review Code 1- Verbalizes Understanding       Core Components/Risk Factors/Patient Goals Review:   Goals  and Risk Factor Review     Row Name 01/01/22 1128             Core Components/Risk Factors/Patient Goals Review   Personal Goals Review Improve shortness of breath with ADL's       Review Spoke to patient about their shortness of breath and what they can do to improve. Patient has been informed of breathing techniques when starting the program. Patient is informed to tell staff if they have had any med changes and that certain meds they are taking or not taking can be causing shortness of breath.       Expected Outcomes Short: Attend [facility] regularly to improve shortness of breath with ADLs. Long: maintain independence with ADLs                Core Components/Risk Factors/Patient Goals at Discharge (  Final Review):   Goals and Risk Factor Review - 01/01/22 1128       Core Components/Risk Factors/Patient Goals Review   Personal Goals Review Improve shortness of breath with ADL's    Review Spoke to patient about their shortness of breath and what they can do to improve. Patient has been informed of breathing techniques when starting the program. Patient is informed to tell staff if they have had any med changes and that certain meds they are taking or not taking can be causing shortness of breath.    Expected Outcomes Short: Attend [facility] regularly to improve shortness of breath with ADLs. Long: maintain independence with ADLs             ITP Comments:  ITP Comments     Row Name 10/29/21 0941 10/29/21 1002 11/26/21 1133 12/16/21 0912 12/21/21 1108   ITP Comments Virtual orientation call completed today.  He has an appointment on Date: 11/26/2021  for EP eval and gym Orientation.  Documentation of diagnosis [facility] found in [facility] Date: 10/08/2021 . Sam will bring his medication s to confirm he is on all on his [facility] Completed 6MWT and gym orientation. Initial ITP created and sent for review to Dr. Ottie Glazier, Medical Director. 30 Day review completed. Medical Director ITP  review done, changes made as directed, and signed approval by Medical Director.   new to program First full day of exercise!  Patient was oriented to gym and equipment including functions, settings, policies, and procedures.  Patient's individual exercise prescription and treatment plan were reviewed.  All starting workloads were established based on the results of the 6 minute walk test done at initial orientation visit.  The plan for exercise progression was also introduced and progression will be customized based on patient's performance and goals.    [place] Name 01/13/22 0907           ITP Comments 30 Day review completed. Medical Director ITP review done, changes made as directed, and signed approval by Medical Director.                Comments:

## 2022-01-15 ENCOUNTER — Other Ambulatory Visit: Payer: Self-pay

## 2022-01-15 ENCOUNTER — Encounter: Payer: Medicare HMO | Admitting: *Deleted

## 2022-01-15 DIAGNOSIS — J449 Chronic obstructive pulmonary disease, unspecified: Secondary | ICD-10-CM

## 2022-01-15 DIAGNOSIS — R0600 Dyspnea, unspecified: Secondary | ICD-10-CM

## 2022-01-15 DIAGNOSIS — Z5189 Encounter for other specified aftercare: Secondary | ICD-10-CM | POA: Diagnosis not present

## 2022-01-15 NOTE — Progress Notes (Signed)
Daily Session Note  Patient Details  Name: Bradley Sawyer MRN: [id number] Date of Birth: 1956/06/18 Referring Provider:   Flowsheet Row Pulmonary Rehab from 11/26/2021 in [facility] Cardiac and Pulmonary Rehab  Referring Provider Vernard Gambles MD       Encounter Date: 01/15/2022  Check In:  Session Check In - 01/15/22 1103       Check-In   Supervising physician immediately available to respond to emergencies See telemetry face sheet for immediately available ER MD    Location [facility]-Cardiac & Pulmonary Rehab    Staff Present Renita Papa, RN BSN;Joseph Tessie Fass, RCP,RRT,BSRT;Melissa [place], [place], LDN    Virtual Visit No    Medication changes reported     No    Fall or balance concerns reported    No    Warm-up and Cool-down Performed on first and last piece of equipment    Resistance Training Performed Yes    VAD Patient? No    PAD/SET Patient? No      Pain Assessment   Currently in Pain? No/denies                Social History   Tobacco Use  Smoking Status Former   Packs/day: 2.00   Years: 48.00   Pack years: 96.00   Types: Cigarettes   Quit date: 11/22/2020   Years since quitting: 1.1  Smokeless Tobacco Never    Goals Met:  Independence with exercise equipment Exercise tolerated well No report of concerns or symptoms today Strength training completed today  Goals Unmet:  Not Applicable  Comments: Pt able to follow exercise prescription today without complaint.  Will continue to monitor for progression.    Dr. Emily Filbert is Medical Director for [place].  Dr. Ottie Glazier is Medical Director for [facility] Pulmonary Rehabilitation.

## 2022-01-18 ENCOUNTER — Other Ambulatory Visit: Payer: Self-pay

## 2022-01-18 ENCOUNTER — Encounter: Payer: Medicare HMO | Admitting: *Deleted

## 2022-01-18 DIAGNOSIS — J449 Chronic obstructive pulmonary disease, unspecified: Secondary | ICD-10-CM

## 2022-01-18 DIAGNOSIS — R0600 Dyspnea, unspecified: Secondary | ICD-10-CM

## 2022-01-18 DIAGNOSIS — Z5189 Encounter for other specified aftercare: Secondary | ICD-10-CM | POA: Diagnosis not present

## 2022-01-18 NOTE — Progress Notes (Signed)
Daily Session Note  Patient Details  Name: Bradley Sawyer MRN: [id number] Date of Birth: 28-Aug-1956 Referring Provider:   Flowsheet Row Pulmonary Rehab from 11/26/2021 in [facility] Cardiac and Pulmonary Rehab  Referring Provider Vernard Gambles MD       Encounter Date: 01/18/2022  Check In:  Session Check In - 01/18/22 1104       Check-In   Supervising physician immediately available to respond to emergencies See telemetry face sheet for immediately available ER MD    Location [facility]-Cardiac & Pulmonary Rehab    Staff Present Renita Papa, RN Moises Blood, BS, ACSM CEP, Exercise Physiologist;Amanda Oletta Darter, BA, ACSM CEP, Exercise Physiologist;Kelly Rosalia Hammers, MPA, RN    Virtual Visit No    Medication changes reported     No    Fall or balance concerns reported    No    Warm-up and Cool-down Performed on first and last piece of equipment    Resistance Training Performed Yes    VAD Patient? No    PAD/SET Patient? No      Pain Assessment   Currently in Pain? No/denies                Social History   Tobacco Use  Smoking Status Former   Packs/day: 2.00   Years: 48.00   Pack years: 96.00   Types: Cigarettes   Quit date: 11/22/2020   Years since quitting: 1.1  Smokeless Tobacco Never    Goals Met:  Independence with exercise equipment Exercise tolerated well No report of concerns or symptoms today Strength training completed today  Goals Unmet:  Not Applicable  Comments: Pt able to follow exercise prescription today without complaint.  Will continue to monitor for progression.    Dr. Emily Filbert is Medical Director for [place].  Dr. Ottie Glazier is Medical Director for [facility] Pulmonary Rehabilitation.

## 2022-01-22 ENCOUNTER — Encounter: Payer: Medicare HMO | Attending: Pulmonary Disease | Admitting: *Deleted

## 2022-01-22 ENCOUNTER — Other Ambulatory Visit: Payer: Self-pay

## 2022-01-22 DIAGNOSIS — J449 Chronic obstructive pulmonary disease, unspecified: Secondary | ICD-10-CM | POA: Diagnosis not present

## 2022-01-22 DIAGNOSIS — R0600 Dyspnea, unspecified: Secondary | ICD-10-CM | POA: Insufficient documentation

## 2022-01-22 NOTE — Progress Notes (Signed)
Daily Session Note ? ?Patient Details  ?Name: Bradley Sawyer ?MRN: [id number] ?Date of Birth: [birth year] ?Referring Provider:   ?Flowsheet Row Pulmonary Rehab from 11/26/2021 in [facility] Cardiac and Pulmonary Rehab  ?Referring Provider Vernard Gambles MD  ? ?  ? ? ?Encounter Date: 01/22/2022 ? ?Check In: ? Session Check In - 01/22/22 1059   ? ?  ? Check-In  ? Supervising physician immediately available to respond to emergencies See telemetry face sheet for immediately available ER MD   ? Location [facility]-Cardiac & Pulmonary Rehab   ? Staff Present Renita Papa, RN BSN;Joseph [place], RCP,RRT,BSRT;Jessica [place], [place], [place], [place], CCET   ? Virtual Visit No   ? Medication changes reported     No   ? Fall or balance concerns reported    No   ? Warm-up and Cool-down Performed on first and last piece of equipment   ? Resistance Training Performed Yes   ? VAD Patient? No   ? PAD/SET Patient? No   ?  ? Pain Assessment  ? Currently in Pain? No/denies   ? ?  ?  ? ?  ? ? ? ? ? ?Social History  ? ?Tobacco Use  ?Smoking Status Former  ? Packs/day: 2.00  ? Years: 48.00  ? Pack years: 96.00  ? Types: Cigarettes  ? Quit date: 11/22/2020  ? Years since quitting: 1.1  ?Smokeless Tobacco Never  ? ? ?Goals Met:  ?Independence with exercise equipment ?Exercise tolerated well ?No report of concerns or symptoms today ?Strength training completed today ? ?Goals Unmet:  ?Not Applicable ? ?Comments: Pt able to follow exercise prescription today without complaint.  Will continue to monitor for progression. ? ? ? ?Dr. Emily Filbert is Medical Director for [place].  ?Dr. Ottie Glazier is Medical Director for [facility] Pulmonary Rehabilitation. ?

## 2022-01-27 ENCOUNTER — Ambulatory Visit: Payer: Self-pay | Admitting: Urology

## 2022-01-29 ENCOUNTER — Encounter: Payer: Medicare HMO | Admitting: *Deleted

## 2022-01-29 ENCOUNTER — Telehealth: Payer: Self-pay | Admitting: Pulmonary Disease

## 2022-01-29 ENCOUNTER — Other Ambulatory Visit: Payer: Self-pay

## 2022-01-29 DIAGNOSIS — R0600 Dyspnea, unspecified: Secondary | ICD-10-CM

## 2022-01-29 DIAGNOSIS — J449 Chronic obstructive pulmonary disease, unspecified: Secondary | ICD-10-CM

## 2022-01-29 NOTE — Progress Notes (Signed)
Incomplete Session ? ?Patient Details  ?Name: Bradley Sawyer ?MRN: [id number] ?Date of Birth: 01/23/1956 ?Referring Provider:   ?Flowsheet Row Pulmonary Rehab from 11/26/2021 in [facility] Cardiac and Pulmonary Rehab  ?Referring Provider Vernard Gambles MD  ? ?  ? ? ?Encounter Date: 01/29/2022 ? ?Sam did not feel well after exercising for a few minutes. His chronic hip and leg pain was too much to continue. He was planning on going home to rest.  ?

## 2022-01-29 NOTE — Telephone Encounter (Signed)
Patient called and states his PCP will be ordering imaging for lungs, nothing further needed.  ?

## 2022-02-01 ENCOUNTER — Other Ambulatory Visit: Payer: Self-pay

## 2022-02-01 ENCOUNTER — Encounter: Payer: Medicare HMO | Admitting: *Deleted

## 2022-02-01 DIAGNOSIS — J449 Chronic obstructive pulmonary disease, unspecified: Secondary | ICD-10-CM

## 2022-02-01 DIAGNOSIS — R0600 Dyspnea, unspecified: Secondary | ICD-10-CM

## 2022-02-01 NOTE — Progress Notes (Signed)
Daily Session Note ? ?Patient Details  ?Name: Bradley Sawyer ?MRN: [id number] ?Date of Birth: 12-21-1955 ?Referring Provider:   ?Flowsheet Row Pulmonary Rehab from 11/26/2021 in [facility] Cardiac and Pulmonary Rehab  ?Referring Provider Vernard Gambles MD  ? ?  ? ? ?Encounter Date: 02/01/2022 ? ?Check In: ? Session Check In - 02/01/22 1100   ? ?  ? Check-In  ? Supervising physician immediately available to respond to emergencies See telemetry face sheet for immediately available ER MD   ? Location [facility]-Cardiac & Pulmonary Rehab   ? Staff Present Renita Papa, RN Moises Blood, BS, ACSM CEP, Exercise Physiologist;Amanda Oletta Darter, BA, ACSM CEP, Exercise Physiologist;Kelly Rosalia Hammers, MPA, RN   ? Virtual Visit No   ? Medication changes reported     No   ? Fall or balance concerns reported    No   ? Warm-up and Cool-down Performed on first and last piece of equipment   ? Resistance Training Performed Yes   ? VAD Patient? No   ? PAD/SET Patient? No   ?  ? Pain Assessment  ? Currently in Pain? No/denies   ? ?  ?  ? ?  ? ? ? ? ? ?Social History  ? ?Tobacco Use  ?Smoking Status Former  ? Packs/day: 2.00  ? Years: 48.00  ? Pack years: 96.00  ? Types: Cigarettes  ? Quit date: 11/22/2020  ? Years since quitting: 1.1  ?Smokeless Tobacco Never  ? ? ?Goals Met:  ?Independence with exercise equipment ?Exercise tolerated well ?No report of concerns or symptoms today ?Strength training completed today ? ?Goals Unmet:  ?Not Applicable ? ?Comments: Pt able to follow exercise prescription today without complaint.  Will continue to monitor for progression. ? ? ? ?Dr. Emily Filbert is Medical Director for [place].  ?Dr. Ottie Glazier is Medical Director for [facility] Pulmonary Rehabilitation. ?

## 2022-02-08 ENCOUNTER — Other Ambulatory Visit: Payer: Self-pay

## 2022-02-08 ENCOUNTER — Encounter: Payer: Medicare HMO | Admitting: *Deleted

## 2022-02-08 DIAGNOSIS — J449 Chronic obstructive pulmonary disease, unspecified: Secondary | ICD-10-CM

## 2022-02-08 DIAGNOSIS — R0600 Dyspnea, unspecified: Secondary | ICD-10-CM

## 2022-02-08 NOTE — Progress Notes (Signed)
Daily Session Note ? ?Patient Details  ?Name: Bradley Sawyer ?MRN: [id number] ?Date of Birth: 1956-09-22 ?Referring Provider:   ?Flowsheet Row Pulmonary Rehab from 11/26/2021 in [facility] Cardiac and Pulmonary Rehab  ?Referring Provider Vernard Gambles MD  ? ?  ? ? ?Encounter Date: 02/08/2022 ? ?Check In: ? Session Check In - 02/08/22 1100   ? ?  ? Check-In  ? Supervising physician immediately available to respond to emergencies See telemetry face sheet for immediately available ER MD   ? Location [facility]-Cardiac & Pulmonary Rehab   ? Staff Present Renita Papa, RN Moises Blood, BS, ACSM CEP, Exercise Physiologist;Kelly Rosalia Hammers, MPA, RN;Jessica Luan Pulling, MA, RCEP, CCRP, CCET   ? Virtual Visit No   ? Medication changes reported     No   ? Fall or balance concerns reported    No   ? Warm-up and Cool-down Performed on first and last piece of equipment   ? Resistance Training Performed Yes   ? VAD Patient? No   ? PAD/SET Patient? No   ?  ? Pain Assessment  ? Currently in Pain? No/denies   ? ?  ?  ? ?  ? ? ? ? ? ?Social History  ? ?Tobacco Use  ?Smoking Status Former  ? Packs/day: 2.00  ? Years: 48.00  ? Pack years: 96.00  ? Types: Cigarettes  ? Quit date: 11/22/2020  ? Years since quitting: 1.2  ?Smokeless Tobacco Never  ? ? ?Goals Met:  ?Independence with exercise equipment ?Exercise tolerated well ?No report of concerns or symptoms today ?Strength training completed today ? ?Goals Unmet:  ?Not Applicable ? ?Comments: Pt able to follow exercise prescription today without complaint.  Will continue to monitor for progression. ? ? ? ?Dr. Emily Filbert is Medical Director for [place].  ?Dr. Ottie Glazier is Medical Director for [facility] Pulmonary Rehabilitation. ?

## 2022-02-09 ENCOUNTER — Encounter: Payer: Self-pay | Admitting: Urology

## 2022-02-09 ENCOUNTER — Other Ambulatory Visit: Payer: Self-pay

## 2022-02-09 ENCOUNTER — Ambulatory Visit (INDEPENDENT_AMBULATORY_CARE_PROVIDER_SITE_OTHER): Payer: Medicare HMO | Admitting: Urology

## 2022-02-09 VITALS — BP 128/73 | HR 76 | Ht 71.0 in | Wt 220.0 lb

## 2022-02-09 DIAGNOSIS — Z87448 Personal history of other diseases of urinary system: Secondary | ICD-10-CM

## 2022-02-09 DIAGNOSIS — R351 Nocturia: Secondary | ICD-10-CM

## 2022-02-09 DIAGNOSIS — N401 Enlarged prostate with lower urinary tract symptoms: Secondary | ICD-10-CM

## 2022-02-09 NOTE — Progress Notes (Signed)
? ? ?02/09/2022 ?12:48 PM  ? ?Bradley Sawyer ?March 20, 1956 ?[id number] ? ?Referring provider: Curly Rim, MD ?[address] ?[place],  [place] [id number] ? ?Chief Complaint  ?Patient presents with  ? Benign Prostatic Hypertrophy  ? Hematuria  ? ?Urological history ?1. High risk hematuria ?- former smoker ?- Contrast CT in 01/2017 revealed a small cyst at inferior pole RIGHT kidney 2.2 x 1.7 cm image 41. Adrenal glands, kidneys, ureters, and bladder otherwise normal appearance.  Minimal prostatic enlargement with scattered prostatic calcifications. Seminal vesicles unremarkable ?- Cystoscopy in 03/2017 with Dr. Pilar Jarvis noted an enlarged prostate Visually obstructive - 4 cm in length ?- CTU 09/13/2019 The prostate gland indents the bladder base.  Adrenal glands normal. 2.6 by 2.1 cm right kidney lower pole fluid density lesion without enhancement, compatible with simple cyst.  Small vascular calcification in the left renal hilum. No urinary tract calculus observed. No significant abnormal filling defect or abnormal enhancement along the urothelium.  The prostate gland measures 5.2 by 4.2 by 5.2 cm (volume = 59 cm^3) and indents the bladder base. Similar pattern of left posterolateral peripheral zone calcifications near the apex ?- Cysto with Dr. Bernardo Heater 09/2019 Prominent lateral lobe enlargement prostate, hypervascularity and moderate median lobe ?- no reports of gross heme ?- UA negative for micro heme ? ?2. Orchitis/hydrocele ?- Scrotal ultrasound in 03/2017 revealed no evidence of testicular torsion.  New rounded hypoechoic lesion in upper left testicle which shows mild hypervascularity. This is suspicious for orchitis given evidence of associated epididymitis and its new appearance since prior study approximately 1 month ago. Testicular neoplasm is considered less likely, and follow-up by ultrasound is recommended in 2-3 months.  Probable mild left epididymitis.  Moderate complex left hydrocele, and small  right hydrocele ?- Tumor markers were negative ?- sought a second opinion regarding our recommendation in 05/2017 with urology in [facility] and they also recommended a follow up scrotal ultrasound which he did not follow up with the repeat imaging ?- SUS 08/2019 Normal-appearing testicles bilaterally. The previously seen area of decreased echogenicity within the left testicle as resolved.  Epididymal cysts bilaterally stable in appearance from the prior exam.  Bilateral hydroceles are noted although improved from the prior study particularly on the left. ? ?3. BPH with LU TS ?- PSA 0.2 01/2022 ?- I PSS 6/2 ?- managed with tamsulosin 0.4 mg daily  ? ? ?HPI: ?Bradley Sawyer is a 66 y.o. male who presents today for a one year follow up.  ? ?Nocturia x 2-3.  Patient denies any modifying or aggravating factors.  Patient denies any gross hematuria, dysuria or suprapubic/flank pain.  Patient denies any fevers, chills, nausea or vomiting.   ? ? IPSS   ? ? [name] 02/09/22 1000  ?  ?  ?  ? International Prostate Symptom Score  ? How often have you had the sensation of not emptying your bladder? Less than 1 in 5    ? How often have you had to urinate less than every two hours? Less than 1 in 5 times    ? How often have you found you stopped and started again several times when you urinated? Less than 1 in 5 times    ? How often have you found it difficult to postpone urination? Not at All    ? How often have you had a weak urinary stream? Less than 1 in 5 times    ? How often have you had  to strain to start urination? Not at All    ? How many times did you typically get up at night to urinate? 2 Times    ? Total IPSS Score 6    ?  ? Quality of Life due to urinary symptoms  ? If you were to spend the rest of your life with your urinary condition just the way it is now how would you feel about that? Mostly Satisfied    ? ?  ?  ? ?  ? ? ?Score:  ?1-7 Mild ?8-19 Moderate ?20-35 Severe ?  ?  ?  ?PMH: ?Past Medical History:   ?Diagnosis Date  ? Anxiety   ? Arthritis   ? 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 Cocaine dependence   ? Last use 1998, attends 3 NA meetings weekly.  ? Compression fracture   ? L2  ? COPD (chronic obstructive pulmonary disease) ([place])   ? bronchitis  ? CVA (cerebral infarction)   ? Disorder of vocal cord   ? Fatty liver   ? GERD (gastroesophageal reflux disease)   ? Hx of cardiovascular stress test   ? ETT/Lexiscan-Myoview (10/15):  Inferior, inferoapical, apical lateral defect c/w mild ischemia, EF 66%; Intermediate Risk  ? Hyperlipidemia   ? Hypertension   ? Right shoulder pain   ? 2/2 partial rotator cuff tear, tendinopathy, mild subacromial/subdeltioid bursitis, A/C joint arthropathy per MRI done in 3/07  ? Tobacco abuse   ? ? ?Surgical History: ?Past Surgical History:  ?Procedure Laterality Date  ? ANTERIOR LAT LUMBAR FUSION N/A 12/18/2013  ? Procedure: Lumbar Two Anteriorlateral Corpectomy w/ cage, interbody fusion, anterior plating, Lumbar One to Lumbar Three percutaneous pedicle screws;  Surgeon: Charlie Pitter, MD;  Location: [place] NEURO ORS;  Service: Neurosurgery;  Laterality: N/A;  ? AORTA -INNOMIATE BYPASS N/A 10/15/2014  ? Procedure: ENDARTERCTOMY OF  INNOMINATE ARTERY ;  Surgeon: Elam Dutch, MD;  Location: [place];  Service: Vascular;  Laterality: N/A;  ? ARCH AORTOGRAM N/A 08/23/2014  ? Procedure: ARCH AORTOGRAM;  Surgeon: Elam Dutch, MD;  Location: [facility] CATH LAB;  Service: Cardiovascular;  Laterality: N/A;  ? arch aortogram, left carotid,left subclavian angiogram  08/23/2014  ? CAROTID ANGIOGRAM Left 08/23/2014  ? Procedure: CAROTID ANGIOGRAM;  Surgeon: Elam Dutch, MD;  Location: [facility] CATH LAB;  Service: Cardiovascular;  Laterality: Left;  ? CHOLECYSTECTOMY  12/2016  ? ENDARTERECTOMY Right 10/15/2014  ? Procedure: ENDARTERECTOMY RIGHT SUBCLAVIAN ARTERY;  Surgeon: Elam Dutch, MD;  Location: [place];  Service: Vascular;  Laterality: Right;  ? ENDARTERECTOMY Right  10/15/2014  ? Procedure: ENDARTERECTOMY RIGHT COMMON CAROTID ARTERY;  Surgeon: Elam Dutch, MD;  Location: [place];  Service: Vascular;  Laterality: Right;  ? 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 HERNIA REPAIR    ? LEFT HEART CATH AND CORONARY ANGIOGRAPHY N/A 03/17/2021  ? Procedure: LEFT HEART CATH AND CORONARY ANGIOGRAPHY;  Surgeon: Dionisio David, MD;  Location: [place] CV LAB;  Service: Cardiovascular;  Laterality: N/A;  ? LEFT HEART CATHETERIZATION WITH CORONARY ANGIOGRAM N/A 09/12/2014  ? Procedure: LEFT HEART CATHETERIZATION WITH CORONARY ANGIOGRAM;  Surgeon: Josue Hector, MD;  Location: [facility] CATH LAB;  Service: Cardiovascular;  Laterality: N/A;  ? LUMBAR SPINE SURGERY  12/18/2013  ? L2     DR POOL  ? NECK SURGERY    ? PATCH ANGIOPLASTY Right 10/15/2014  ? Procedure: PATCH ANGIOPLASTY OF RIGHT SUBCLAVIAN ARTERY , RIGHT COMMON CAROTID ARTERY &  INNOMINATE ARTERY;  Surgeon: Elam Dutch, MD;  Location: [place];  Service: Vascular;  Laterality: Right;  ? STERNOTOMY N/A 10/15/2014  ? Procedure: PARTIAL STERNOTOMY & PLATING OF STERNUM;  Surgeon: Elam Dutch, MD;  Location: [place];  Service: Vascular;  Laterality: N/A;  ? TEE WITHOUT CARDIOVERSION N/A 12/25/2013  ? Procedure: TRANSESOPHAGEAL ECHOCARDIOGRAM (TEE);  Surgeon: Larey Dresser, MD;  Location: [place];  Service: Cardiovascular;  Laterality: N/A;  ? ? ?Home Medications:  ?Allergies as of 02/09/2022   ? ?   Reactions  ? Bee Venom Swelling  ? ?  ? ?  ?Medication List  ?  ? ?  ? Accurate as of February 09, 2022 11:59 PM. If you have any questions, ask your nurse or doctor.  ?  ?  ? ?  ? ?amLODipine 5 MG tablet ?Commonly known as: NORVASC ?Take 5 mg by mouth daily. ?  ?aspirin EC 81 MG tablet ?Take 81 mg by mouth daily. ?  ?atorvastatin 80 MG tablet ?Commonly known as: LIPITOR ?Take 80 mg by mouth daily. ?  ?budesonide 0.25 MG/2ML nebulizer solution ?Commonly known as: PULMICORT ?Take 2 mLs (0.25 mg total) by nebulization 2 (two)  times daily. ?  ?clopidogrel 75 MG tablet ?Commonly known as: PLAVIX ?Take 75 mg by mouth daily. ?  ?EPINEPHrine 0.3 mg/0.3 mL Soaj injection ?Commonly known as: EPI-PEN ?Inject 0.3 mg into the muscle as neede

## 2022-02-10 ENCOUNTER — Telehealth: Payer: Self-pay

## 2022-02-10 ENCOUNTER — Encounter: Payer: Self-pay | Admitting: *Deleted

## 2022-02-10 DIAGNOSIS — R0600 Dyspnea, unspecified: Secondary | ICD-10-CM

## 2022-02-10 DIAGNOSIS — J449 Chronic obstructive pulmonary disease, unspecified: Secondary | ICD-10-CM

## 2022-02-10 LAB — URINALYSIS, COMPLETE
Bilirubin, UA: NEGATIVE
Glucose, UA: NEGATIVE
Ketones, UA: NEGATIVE
Nitrite, UA: NEGATIVE
Protein,UA: NEGATIVE
Specific Gravity, UA: >1.03 — ABNORMAL HIGH (ref 1.005–1.030)
Urobilinogen, Ur: 0.2 mg/dL (ref 0.2–1.0)
pH, UA: 5.5 (ref 5.0–7.5)

## 2022-02-10 LAB — PSA: Prostate Specific Ag, Serum: 0.2 ng/mL (ref 0.0–4.0)

## 2022-02-10 LAB — MICROSCOPIC EXAMINATION

## 2022-02-10 NOTE — Progress Notes (Signed)
Spoke with Bradley Sawyer regarding pulmonary rehab- he has mentioned potentially not continuing with the program due to exacerbation of chronic pain post sessions. He would like to speak with his doctor about water aerobics instead. ?

## 2022-02-10 NOTE — Progress Notes (Signed)
Pulmonary Individual Treatment Plan ? ?Patient Details  ?Name: Bradley Sawyer ?MRN: [id number] ?Date of Birth: 17-Apr-1956 ?Referring Provider:   ?Flowsheet Row Pulmonary Rehab from 11/26/2021 in [facility]  ?Referring Provider Vernard Gambles MD  ? ?  ? ? ?Initial Encounter Date:  ?Flowsheet Row Pulmonary Rehab from 11/26/2021 in [facility]  ?Date 11/26/21  ? ?  ? ? ?Visit Diagnosis: Stage 3 severe COPD by GOLD classification ([place]) ? ?Dyspnea, unspecified type ? ?Patient's Home Medications on Admission: ? ?Current Outpatient Medications:  ?  albuterol (PROVENTIL) (2.5 MG/3ML) 0.083% nebulizer solution, USE 1 AMPULE IN NEBULIZER EVERY 6 HOURS AS NEEDED., Disp: 75 mL, Rfl: 0 ?  amLODipine (NORVASC) 5 MG tablet, Take 5 mg by mouth daily., Disp: , Rfl:  ?  aspirin EC 81 MG tablet, Take 81 mg by mouth daily., Disp: , Rfl:  ?  atorvastatin (LIPITOR) 80 MG tablet, Take 80 mg by mouth daily., Disp: , Rfl:  ?  budesonide (PULMICORT) 0.25 MG/2ML nebulizer solution, Take 2 mLs (0.25 mg total) by nebulization 2 (two) times daily., Disp: 120 mL, Rfl: 11 ?  clopidogrel (PLAVIX) 75 MG tablet, Take 75 mg by mouth daily., Disp: , Rfl:  ?  EPINEPHrine 0.3 mg/0.3 mL IJ SOAJ injection, Inject 0.3 mg into the muscle as needed for anaphylaxis., Disp: , Rfl:  ?  famotidine (PEPCID) 40 MG tablet, Take by mouth., Disp: , Rfl:  ?  FLUoxetine (PROZAC) 20 MG capsule, Take 20 mg by mouth daily., Disp: , Rfl:  ?  formoterol (PERFOROMIST) 20 MCG/2ML nebulizer solution, Take 2 mLs (20 mcg total) by nebulization 2 (two) times daily., Disp: 120 mL, Rfl: 11 ?  isosorbide mononitrate (IMDUR) 30 MG 24 hr tablet, Take 30 mg by mouth daily., Disp: , Rfl:  ?  levocetirizine (XYZAL) 5 MG tablet, Take 5 mg by mouth at bedtime., Disp: , Rfl:  ?  linaclotide (LINZESS) 290 MCG CAPS capsule, Take 290 mcg by mouth daily before breakfast., Disp: , Rfl:  ?  lisinopril-hydrochlorothiazide (ZESTORETIC) 20-25 MG tablet, Take 1  tablet by mouth daily., Disp: , Rfl:  ?  meloxicam (MOBIC) 15 MG tablet, TAKE 1 TABLET IN THE MORNING WITH FOOD, Disp: , Rfl:  ?  metFORMIN (GLUCOPHAGE) 500 MG tablet, Take 500 mg by mouth daily., Disp: , Rfl:  ?  montelukast (SINGULAIR) 10 MG tablet, Take 10 mg by mouth daily., Disp: , Rfl:  ?  Multiple Vitamin (MULTI-VITAMINS) TABS, Take 1 tablet by mouth daily., Disp: , Rfl:  ?  naloxone (NARCAN) nasal spray 4 mg/0.1 mL, Place 0.4 mg into the nose once., Disp: , Rfl:  ?  nitroGLYCERIN (NITROSTAT) 0.4 MG SL tablet, Place 0.4 mg under the tongue every 5 (five) minutes as needed for chest pain., Disp: , Rfl:  ?  Olopatadine HCl 0.2 % SOLN, INSTILL 1 DROPS INTO EACH EYE ONCE DAILY AS NEEDED FOR ITCHY EYES., Disp: , Rfl:  ?  ondansetron (ZOFRAN) 4 MG tablet, Take by mouth., Disp: , Rfl:  ?  oxyCODONE (ROXICODONE) 15 MG immediate release tablet, Take by mouth., Disp: , Rfl:  ?  pantoprazole (PROTONIX) 40 MG tablet, Take 1 tablet by mouth daily., Disp: , Rfl:  ?  polyethylene glycol (MIRALAX / GLYCOLAX) packet, Take 17 g by mouth daily as needed (for constipation)., Disp: , Rfl:  ?  potassium chloride SA (KLOR-CON) 20 MEQ tablet, Take 1 tablet (20 mEq total) by mouth daily for 3 days., Disp: 3  tablet, Rfl: 0 ?  predniSONE (STERAPRED UNI-PAK 21 TAB) 10 MG (21) TBPK tablet, Take as directed in the package, Disp: 21 tablet, Rfl: 0 ?  pregabalin (LYRICA) 150 MG capsule, Take 150 mg by mouth daily., Disp: , Rfl:  ?  PROAIR HFA 108 (90 BASE) MCG/ACT inhaler, INHALE 2 PUFFS INTO THE LUNGS EVERY 6 (SIX) HOURS AS NEEDED FOR WHEEZING. (Patient taking differently: Inhale 2 puffs into the lungs every 6 (six) hours as needed for shortness of breath or wheezing.), Disp: 22.5 Inhaler, Rfl: 1 ?  rOPINIRole (REQUIP) 1 MG tablet, Take 1 tablet by mouth at bedtime., Disp: , Rfl:  ?  spironolactone (ALDACTONE) 25 MG tablet, Take 25 mg by mouth daily., Disp: , Rfl:  ?  tamsulosin (FLOMAX) 0.4 MG CAPS capsule, TAKE (1) CAPSULE BY MOUTH ONCE  DAILY., Disp: 30 capsule, Rfl: 11 ?  YUPELRI 175 MCG/3ML nebulizer solution, Inhale 3 mLs (175 mcg total) into the lungs daily., Disp: 90 mL, Rfl: 11 ?No current facility-administered medications for this visit. ? ?Facility-Administered Medications Ordered in Other Visits:  ?  sodium chloride flush (NS) 0.9 % injection 3 mL, 3 mL, Intravenous, Q12H, Dionisio David, MD ? ?Past Medical History: ?Past Medical History:  ?Diagnosis Date  ? Anxiety   ? Arthritis   ? 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 Cocaine dependence   ? Last use 1998, attends 3 NA meetings weekly.  ? Compression fracture   ? L2  ? COPD (chronic obstructive pulmonary disease) ([place])   ? bronchitis  ? CVA (cerebral infarction)   ? Disorder of vocal cord   ? Fatty liver   ? GERD (gastroesophageal reflux disease)   ? Hx of cardiovascular stress test   ? ETT/Lexiscan-Myoview (10/15):  Inferior, inferoapical, apical lateral defect c/w mild ischemia, EF 66%; Intermediate Risk  ? Hyperlipidemia   ? Hypertension   ? Right shoulder pain   ? 2/2 partial rotator cuff tear, tendinopathy, mild subacromial/subdeltioid bursitis, A/C joint arthropathy per MRI done in 3/07  ? Tobacco abuse   ? ? ?Tobacco Use: ?Social History  ? ?Tobacco Use  ?Smoking Status Former  ? Packs/day: 2.00  ? Years: 48.00  ? Pack years: 96.00  ? Types: Cigarettes  ? Quit date: 11/22/2020  ? Years since quitting: 1.2  ?Smokeless Tobacco Never  ? ? ?Labs: ?Review Flowsheet   ? ?  ?  Latest Ref Rng & Units 10/04/2014 10/15/2014 02/11/2015 02/12/2015  ?Labs for ITP Cardiac and Pulmonary Rehab  ?Cholestrol 0 - 200 mg/dL   179     ?LDL (calc) 0 - 99 mg/dL   NOT CALC     ?Direct LDL mg/dL    94    ?HDL-C >=40 mg/dL   36     ?Trlycerides <150 mg/dL   402     ?Hemoglobin A1c 4.0 - 6.0 %      ?PH, Arterial 7.350 - 7.450 7.421   7.366      ?PCO2 arterial 35.0 - 45.0 mmHg 39.8   49.7      ?Bicarbonate 20.0 - 24.0 mEq/L 25.4   28.2      ?TCO2 0 - 100 mmol/L 26.6   30      ?O2  Saturation % 92.6   97.0      ? ?  05/08/2016  ?Labs for ITP Cardiac and Pulmonary Rehab  ?Cholestrol   ?LDL (calc)   ?Direct LDL   ?HDL-C   ?Trlycerides   ?Hemoglobin A1c  5.8    ?PH, Arterial   ?PCO2 arterial   ?Bicarbonate   ?TCO2   ?O2 Saturation   ?  ? ? Multiple values from one day are sorted in reverse-chronological order  ?  ?  ? ? ? ?Pulmonary Assessment Scores: ? Pulmonary Assessment Scores   ? ? [place] Name 11/26/21 1140  ?  ?  ?  ? ADL UCSD  ? ADL Phase Entry    ? SOB Score total 39    ? Rest 0    ? Walk 3    ? Stairs 5    ? Bath 5    ? Dress 0    ? Shop 0  Patient wrote "N/A"    ?  ? CAT Score  ? CAT Score 29    ?  ? mMRC Score  ? mMRC Score 4    ? ?  ?  ? ?  ?  ?UCSD: ?Self-administered rating of dyspnea associated with activities of daily living (ADLs) ?6-point scale (0 = "not at all" to 5 = "maximal or unable to do because of breathlessness")  ?Scoring Scores range from 0 to 120.  Minimally important difference is 5 units ? ?CAT: ?CAT can identify the health impairment of COPD patients and is better correlated with disease progression.  ?CAT has a scoring range of zero to 40. The CAT score is classified into four groups of low (less than 10), medium (10 - 20), high (21-30) and very high (31-40) based on the impact level of disease on health status. A CAT score over 10 suggests significant symptoms.  A worsening CAT score could be explained by an exacerbation, poor medication adherence, poor inhaler technique, or progression of COPD or comorbid conditions.  ?CAT MCID is 2 points ? ?mMRC: ?mMRC (Modified Medical Research Council) Dyspnea Scale is used to assess the degree of baseline functional disability in patients of respiratory disease due to dyspnea. ?No minimal important difference is established. A decrease in score of 1 point or greater is considered a positive change.  ? ?Pulmonary Function Assessment: ? Pulmonary Function Assessment - 11/26/21 1137   ? ?  ? Pulmonary Function Tests  ? FVC% 66 %    ? FEV1% 29 %   ? FEV1/FVC Ratio 33   ? ?  ?  ? ?  ? ? ?Exercise Target Goals: ?Exercise Program Goal: ?Individual exercise prescription set using results from initial 6 min walk test and THRR while considerin

## 2022-02-10 NOTE — Telephone Encounter (Signed)
-----   Message from Nori Riis, PA-C sent at 02/10/2022  8:06 AM EDT ----- ?Please let Bradley Sawyer know that his PSA was normal.  ?

## 2022-02-10 NOTE — Progress Notes (Signed)
Completed initial RD consultation ?

## 2022-02-10 NOTE — Telephone Encounter (Signed)
Notified pt as advised, pt expressed understanding.  ?

## 2022-02-12 ENCOUNTER — Encounter: Payer: Self-pay | Admitting: *Deleted

## 2022-02-12 DIAGNOSIS — J449 Chronic obstructive pulmonary disease, unspecified: Secondary | ICD-10-CM

## 2022-02-12 NOTE — Progress Notes (Signed)
Discharge Progress Report ? ?Patient Details  ?Name: Bradley Sawyer ?MRN: [id number] ?Date of Birth: 1956-11-10 ?Referring Provider:   ?Flowsheet Row Pulmonary Rehab from 11/26/2021 in [facility] Card[facility] and Pulmonary Rehab  ?Referring Provider Vernard Gambles MD  ? ?  ? ? ? ?Number of Visits: 14 ? ?Reason for Discharge:  ?Patient reached a stable level of exercise. ?Patient independent in their exercise. ?Patient has met program and personal goals. ? ?Smoking History:  ?Social History  ? ?Tobacco Use  ?Smoking Status Former  ? Packs/day: 2.00  ? Years: 48.00  ? Pack years: 96.00  ? Types: Cigarettes  ? Quit date: 11/22/2020  ? Years since quitting: 1.2  ?Smokeless Tobacco Never  ? ? ?Diagnosis:  ?Stage 3 severe COPD by GOLD classification ([place]) ? ?ADL UCSD: ? Pulmonary Assessment Scores   ? ? [place] Name 11/26/21 1140  ?  ?  ?  ? ADL UCSD  ? ADL Phase Entry    ? SOB Score total 39    ? Rest 0    ? Walk 3    ? Stairs 5    ? Bath 5    ? Dress 0    ? Shop 0  Patient wrote "N/A"    ?  ? CAT Score  ? CAT Score 29    ?  ? mMRC Score  ? mMRC Score 4    ? ?  ?  ? ?  ? ? ?Initial Exercise Prescription: ? Initial Exercise Prescription - 11/26/21 1400   ? ?  ? Date of Initial Exercise RX and Referring Provider  ? Date 11/26/21   ? Referring Provider Vernard Gambles MD   ?  ? Oxygen  ? Oxygen Continuous   ? Liters 4   Ranges 3-4  ? Maintain Oxygen Saturation 88% or higher   ?  ? REL-XR  ? Level 1   ? Speed 50   ? Minutes 15   ? METs 2.2   ?  ? Biostep-RELP  ? Level 1   ? SPM 50   ? Minutes 15   ? METs 2.2   ?  ? Track  ? Laps 18   ? Minutes 15   ? METs 1.98   ?  ? Prescription Details  ? Frequency (times per week) 2   ? Duration Progress to 30 minutes of continuous aerobic without signs/symptoms of physical distress   ?  ? Intensity  ? THRR 40-80% of Max Heartrate 107-139   ? Ratings of Perceived Exertion 11-13   ? Perceived Dyspnea 0-4   ?  ? Progression  ? Progression Continue to progress workloads to maintain intensity without  signs/symptoms of physical distress.   ?  ? Resistance Training  ? Training Prescription Yes   ? Weight 3 lb   ? Reps 10-15   ? ?  ?  ? ?  ? ? ?Discharge Exercise Prescription (Final Exercise Prescription Changes): ? Exercise Prescription Changes - 02/09/22 1300   ? ?  ? Response to Exercise  ? Blood Pressure (Admit) 128/78   ? Blood Pressure (Exit) 140/72   ? Heart Rate (Admit) 63 bpm   ? Heart Rate (Exercise) 71 bpm   ? Heart Rate (Exit) 73 bpm   ? Oxygen Saturation (Admit) 96 %   ? Oxygen Saturation (Exercise) 95 %   ? Oxygen Saturation (Exit) 95 %   ? Rating of Perceived Exertion (Exercise) 12   ? Perceived Dyspnea (Exercise) 2   ?  Symptoms SOB   ? Duration Continue with 30 min of aerobic exercise without signs/symptoms of physical distress.   ? Intensity THRR unchanged   ?  ? Progression  ? Progression Continue to progress workloads to maintain intensity without signs/symptoms of physical distress.   ? Average METs 4.05   ?  ? Resistance Training  ? Training Prescription Yes   ? Weight 3 lb   ? Reps 10-15   ?  ? Interval Training  ? Interval Training No   ?  ? Oxygen  ? Oxygen Continuous   ? Liters 4   ?  ? REL-XR  ? Level 2   ? Minutes 15   ? METs 4.5   ?  ? Oxygen  ? Maintain Oxygen Saturation 88% or higher   ? ?  ?  ? ?  ? ? ?Functional Capacity: ? 6 Minute Walk   ? ? [place] Name 11/26/21 1156  ?  ?  ?  ? 6 Minute Walk  ? Phase Initial    ? Distance 1000 feet    ? Walk Time 6 minutes    ? # of Rest Breaks 0    ? MPH 1.89    ? METS 2.24    ? RPE 15    ? Perceived Dyspnea  3    ? VO2 Peak 7.84    ? Symptoms Yes (comment)    ? Comments SOB, buttock pain 7/10    ? Resting HR 76 bpm    ? Resting BP 90/58    ? Resting Oxygen Saturation  94 %    ? Exercise Oxygen Saturation  during 6 min walk 93 %    ? Max Ex. HR 104 bpm    ? Max Ex. BP 98/56    ? 2 Minute Post BP 94/52    ?  ? Interval HR  ? 1 Minute HR 92    ? 2 Minute HR 96    ? 3 Minute HR 103    ? 4 Minute HR 99    ? 5 Minute HR 96    ? 6 Minute HR 104    ? 2  Minute Post HR 83    ? Interval Heart Rate? Yes    ?  ? Interval Oxygen  ? Interval Oxygen? Yes    ? Baseline Oxygen Saturation % 94 %    ? 1 Minute Oxygen Saturation % 94 %    ? 1 Minute Liters of Oxygen 3 L    ? 2 Minute Oxygen Saturation % 93 %    ? 2 Minute Liters of Oxygen 3 L    ? 3 Minute Oxygen Saturation % 95 %    ? 3 Minute Liters of Oxygen 3 L    ? 4 Minute Oxygen Saturation % 94 %    ? 4 Minute Liters of Oxygen 3 L    ? 5 Minute Oxygen Saturation % 95 %    ? 5 Minute Liters of Oxygen 3 L    ? 6 Minute Oxygen Saturation % 96 %    ? 6 Minute Liters of Oxygen 3 L    ? 2 Minute Post Oxygen Saturation % 95 %    ? 2 Minute Post Liters of Oxygen 3 L    ? ?  ?  ? ?  ? ? ?Psychological, QOL, Others - Outcomes: ?PHQ 2/9: ? ?  01/22/2022  ? 11:34 AM 01/01/2022  ? 11:31 AM 11/26/2021  ?  11:39 AM 03/19/2015  ?  1:28 PM 03/14/2015  ?  9:29 AM  ?Depression screen PHQ 2/9  ?Decreased Interest _0 0 0  ?Down, Depressed, Hopeless _1 0 0  ?PHQ - 2 Score _2 0 0  ?Altered sleeping _3 ?Tired, decreased energy _4 ?Change in appetite 2 1 0    ?Feeling bad or failure about yourself  0 0 0    ?Trouble concentrating 0 0 0    ?Moving slowly or fidgety/restless 0 0 0    ?Suicidal thoughts 0 0 0    ?PHQ-9 Score _5 ?Difficult doing work/chores Somewhat difficult Somewhat difficult Somewhat difficult    ? ? ? ? ?Nutrition & Weight - Outcomes: ? Pre Biometrics - 11/26/21 1424   ? ?  ? Pre Biometrics  ? Height 5' 10.25" (1.784 m)   ? Weight 220 lb (99.8 kg)   ? BMI (Calculated) 31.35   ? ?  ?  ? ?  ? ? ? ?Nutrition: ? Nutrition Therapy & Goals - 02/10/22 1605   ? ?  ? Nutrition Therapy  ? Diet Heart healthy, Low Na, pulmonary MNT   ? Drug/Food Interactions Statins/Certain Fruits   ? Protein (specify units) 120g   ? Fiber 30 grams   ? Whole Grain Foods 3 servings   ? Saturated Fats 16 max. grams   ? Fruits and Vegetables 8 servings/day   ? Sodium 2 grams   ?  ? Personal Nutrition Goals  ? Nutrition Goal ST: review  paperwork, consider making changes LT: limit saturated fat <16g/day, limit Na <2g/day, limit eating out <3x/week, eat at least 5 servings of vegetables per day   ? Comments 66 y.o. M admitted to pulmonary rehab for stg 3 severe COPD. PMHx includes HTN, CAD, PAD, GERD. Relevant medications include lipitor, pepcid, prozac, metformin, MVI, oxycodone, zofran, protonix, mirilax, prednisone, K+. PYP Score: 40. Vegetables & Fruits 8/12. Breads, Grains & Cereals 4/12. Red & Processed Meat 7/12. Poultry 0/2. Fish & Shellfish 1/4. Beans, Nuts & Seeds 1/4. Milk & Dairy Foods 2/6. Toppings, Oils, Seasonings & Salt 8/20. Sweets, Snacks & Restaurant Food 3/14. Beverages 6/10. B: 2-3 packs of grits with butter or sausage egg and cheese L: cheeseburger, hotdogs with fries D: daughter in-laws will cook: pork, beef, chicken, salmon patties with 2 vegetables (potatoes, greens, cabbage). S: fruit like grapes and bananas. He reports wanting to know foods for lung healthy, discussed importance of variety of plant foods, limiting sodium, limiting refined grains and simple sugars, limiting saturated fat and processed meats. Suggested trying to start with boiled eggs made at beginning of the week for grits and adding in spinach and tomatoes to add in some easy vegetables; he reports not wanting to add in eggs as he eats them all in one sitting - suggested cooking one egg at a time. Discussed heart healthy eating and pulmonary MNT.   ?  ? Intervention Plan  ? Intervention Prescribe, educate and counsel regarding individualized specific dietary modifications aiming towards targeted core components such as weight, hypertension, lipid management, diabetes, heart failure and other comorbidities.   ? Expected Outcomes Short Term Goal: Understand basic principles of dietary content, such as calories, fat, sodium, cholesterol and nutrients.;Short Term Goal: A plan has been developed with personal nutrition goals set during dietitian  appointment.;Long Term Goal: Adherence to prescribed nutrition plan.   ? ?  ?  ? ?  ? ? ?  Education Questionnaire Score: ? Knowledge Questionnaire Score - 11/26/21 1425   ? ?  ? Knowledge Questionnaire Score  ? Pre Score 15/18: A&P

## 2022-02-12 NOTE — Progress Notes (Signed)
Pulmonary Individual Treatment Plan ? ?Patient Details  ?Name: Bradley Sawyer ?MRN: [id number] ?Date of Birth: 01-13-1956 ?Referring Provider:   ?Flowsheet Row Pulmonary Rehab from 11/26/2021 in [facility] Cardiac and Pulmonary Rehab  ?Referring Provider Vernard Gambles MD  ? ?  ? ? ?Initial Encounter Date:  ?Flowsheet Row Pulmonary Rehab from 11/26/2021 in [facility] Cardiac and Pulmonary Rehab  ?Date 11/26/21  ? ?  ? ? ?Visit Diagnosis: Stage 3 severe COPD by GOLD classification ([place]) ? ?Patient's Home Medications on Admission: ? ?Current Outpatient Medications:  ?  albuterol (PROVENTIL) (2.5 MG/3ML) 0.083% nebulizer solution, USE 1 AMPULE IN NEBULIZER EVERY 6 HOURS AS NEEDED., Disp: 75 mL, Rfl: 0 ?  amLODipine (NORVASC) 5 MG tablet, Take 5 mg by mouth daily., Disp: , Rfl:  ?  aspirin EC 81 MG tablet, Take 81 mg by mouth daily., Disp: , Rfl:  ?  atorvastatin (LIPITOR) 80 MG tablet, Take 80 mg by mouth daily., Disp: , Rfl:  ?  budesonide (PULMICORT) 0.25 MG/2ML nebulizer solution, Take 2 mLs (0.25 mg total) by nebulization 2 (two) times daily., Disp: 120 mL, Rfl: 11 ?  clopidogrel (PLAVIX) 75 MG tablet, Take 75 mg by mouth daily., Disp: , Rfl:  ?  EPINEPHrine 0.3 mg/0.3 mL IJ SOAJ injection, Inject 0.3 mg into the muscle as needed for anaphylaxis., Disp: , Rfl:  ?  famotidine (PEPCID) 40 MG tablet, Take by mouth., Disp: , Rfl:  ?  FLUoxetine (PROZAC) 20 MG capsule, Take 20 mg by mouth daily., Disp: , Rfl:  ?  formoterol (PERFOROMIST) 20 MCG/2ML nebulizer solution, Take 2 mLs (20 mcg total) by nebulization 2 (two) times daily., Disp: 120 mL, Rfl: 11 ?  isosorbide mononitrate (IMDUR) 30 MG 24 hr tablet, Take 30 mg by mouth daily., Disp: , Rfl:  ?  levocetirizine (XYZAL) 5 MG tablet, Take 5 mg by mouth at bedtime., Disp: , Rfl:  ?  linaclotide (LINZESS) 290 MCG CAPS capsule, Take 290 mcg by mouth daily before breakfast., Disp: , Rfl:  ?  lisinopril-hydrochlorothiazide (ZESTORETIC) 20-25 MG tablet, Take 1 tablet by mouth daily.,  Disp: , Rfl:  ?  meloxicam (MOBIC) 15 MG tablet, TAKE 1 TABLET IN THE MORNING WITH FOOD, Disp: , Rfl:  ?  metFORMIN (GLUCOPHAGE) 500 MG tablet, Take 500 mg by mouth daily., Disp: , Rfl:  ?  montelukast (SINGULAIR) 10 MG tablet, Take 10 mg by mouth daily., Disp: , Rfl:  ?  Multiple Vitamin (MULTI-VITAMINS) TABS, Take 1 tablet by mouth daily., Disp: , Rfl:  ?  naloxone (NARCAN) nasal spray 4 mg/0.1 mL, Place 0.4 mg into the nose once., Disp: , Rfl:  ?  nitroGLYCERIN (NITROSTAT) 0.4 MG SL tablet, Place 0.4 mg under the tongue every 5 (five) minutes as needed for chest pain., Disp: , Rfl:  ?  Olopatadine HCl 0.2 % SOLN, INSTILL 1 DROPS INTO EACH EYE ONCE DAILY AS NEEDED FOR ITCHY EYES., Disp: , Rfl:  ?  ondansetron (ZOFRAN) 4 MG tablet, Take by mouth., Disp: , Rfl:  ?  oxyCODONE (ROXICODONE) 15 MG immediate release tablet, Take by mouth., Disp: , Rfl:  ?  pantoprazole (PROTONIX) 40 MG tablet, Take 1 tablet by mouth daily., Disp: , Rfl:  ?  polyethylene glycol (MIRALAX / GLYCOLAX) packet, Take 17 g by mouth daily as needed (for constipation)., Disp: , Rfl:  ?  potassium chloride SA (KLOR-CON) 20 MEQ tablet, Take 1 tablet (20 mEq total) by mouth daily for 3 days., Disp: 3 tablet, Rfl: 0 ?  predniSONE (STERAPRED UNI-PAK 21 TAB) 10 MG (21) TBPK tablet, Take as directed in the package, Disp: 21 tablet, Rfl: 0 ?  pregabalin (LYRICA) 150 MG capsule, Take 150 mg by mouth daily., Disp: , Rfl:  ?  PROAIR HFA 108 (90 BASE) MCG/ACT inhaler, INHALE 2 PUFFS INTO THE LUNGS EVERY 6 (SIX) HOURS AS NEEDED FOR WHEEZING. (Patient taking differently: Inhale 2 puffs into the lungs every 6 (six) hours as needed for shortness of breath or wheezing.), Disp: 22.5 Inhaler, Rfl: 1 ?  rOPINIRole (REQUIP) 1 MG tablet, Take 1 tablet by mouth at bedtime., Disp: , Rfl:  ?  spironolactone (ALDACTONE) 25 MG tablet, Take 25 mg by mouth daily., Disp: , Rfl:  ?  tamsulosin (FLOMAX) 0.4 MG CAPS capsule, TAKE (1) CAPSULE BY MOUTH ONCE DAILY., Disp: 30  capsule, Rfl: 11 ?  YUPELRI 175 MCG/3ML nebulizer solution, Inhale 3 mLs (175 mcg total) into the lungs daily., Disp: 90 mL, Rfl: 11 ?No current facility-administered medications for this visit. ? ?Facility-Administered Medications Ordered in Other Visits:  ?  sodium chloride flush (NS) 0.9 % injection 3 mL, 3 mL, Intravenous, Q12H, Dionisio David, MD ? ?Past Medical History: ?Past Medical History:  ?Diagnosis Date  ? Anxiety   ? Arthritis   ? 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 Cocaine dependence   ? Last use 1998, attends 3 NA meetings weekly.  ? Compression fracture   ? L2  ? COPD (chronic obstructive pulmonary disease) ([place])   ? bronchitis  ? CVA (cerebral infarction)   ? Disorder of vocal cord   ? Fatty liver   ? GERD (gastroesophageal reflux disease)   ? Hx of cardiovascular stress test   ? ETT/Lexiscan-Myoview (10/15):  Inferior, inferoapical, apical lateral defect c/w mild ischemia, EF 66%; Intermediate Risk  ? Hyperlipidemia   ? Hypertension   ? Right shoulder pain   ? 2/2 partial rotator cuff tear, tendinopathy, mild subacromial/subdeltioid bursitis, A/C joint arthropathy per MRI done in 3/07  ? Tobacco abuse   ? ? ?Tobacco Use: ?Social History  ? ?Tobacco Use  ?Smoking Status Former  ? Packs/day: 2.00  ? Years: 48.00  ? Pack years: 96.00  ? Types: Cigarettes  ? Quit date: 11/22/2020  ? Years since quitting: 1.2  ?Smokeless Tobacco Never  ? ? ?Labs: ?Review Flowsheet   ? ?  ?  Latest Ref Rng & Units 10/04/2014 10/15/2014 02/11/2015 02/12/2015  ?Labs for ITP Cardiac and Pulmonary Rehab  ?Cholestrol 0 - 200 mg/dL   179     ?LDL (calc) 0 - 99 mg/dL   NOT CALC     ?Direct LDL mg/dL    94    ?HDL-C >=40 mg/dL   36     ?Trlycerides <150 mg/dL   402     ?Hemoglobin A1c 4.0 - 6.0 %      ?PH, Arterial 7.350 - 7.450 7.421   7.366      ?PCO2 arterial 35.0 - 45.0 mmHg 39.8   49.7      ?Bicarbonate 20.0 - 24.0 mEq/L 25.4   28.2      ?TCO2 0 - 100 mmol/L 26.6   30      ?O2 Saturation % 92.6   97.0       ? ?  05/08/2016  ?Labs for ITP Cardiac and Pulmonary Rehab  ?Cholestrol   ?LDL (calc)   ?Direct LDL   ?HDL-C   ?Trlycerides   ?Hemoglobin A1c 5.8    ?PH,  Arterial   ?PCO2 arterial   ?Bicarbonate   ?TCO2   ?O2 Saturation   ?  ? ? Multiple values from one day are sorted in reverse-chronological order  ?  ?  ? ? ? ?Pulmonary Assessment Scores: ? Pulmonary Assessment Scores   ? ? [place] Name 11/26/21 1140  ?  ?  ?  ? ADL UCSD  ? ADL Phase Entry    ? SOB Score total 39    ? Rest 0    ? Walk 3    ? Stairs 5    ? Bath 5    ? Dress 0    ? Shop 0  Patient wrote "N/A"    ?  ? CAT Score  ? CAT Score 29    ?  ? mMRC Score  ? mMRC Score 4    ? ?  ?  ? ?  ?  ?UCSD: ?Self-administered rating of dyspnea associated with activities of daily living (ADLs) ?6-point scale (0 = "not at all" to 5 = "maximal or unable to do because of breathlessness")  ?Scoring Scores range from 0 to 120.  Minimally important difference is 5 units ? ?CAT: ?CAT can identify the health impairment of COPD patients and is better correlated with disease progression.  ?CAT has a scoring range of zero to 40. The CAT score is classified into four groups of low (less than 10), medium (10 - 20), high (21-30) and very high (31-40) based on the impact level of disease on health status. A CAT score over 10 suggests significant symptoms.  A worsening CAT score could be explained by an exacerbation, poor medication adherence, poor inhaler technique, or progression of COPD or comorbid conditions.  ?CAT MCID is 2 points ? ?mMRC: ?mMRC (Modified Medical Research Council) Dyspnea Scale is used to assess the degree of baseline functional disability in patients of respiratory disease due to dyspnea. ?No minimal important difference is established. A decrease in score of 1 point or greater is considered a positive change.  ? ?Pulmonary Function Assessment: ? Pulmonary Function Assessment - 11/26/21 1137   ? ?  ? Pulmonary Function Tests  ? FVC% 66 %   ? FEV1% 29 %   ? FEV1/FVC  Ratio 33   ? ?  ?  ? ?  ? ? ?Exercise Target Goals: ?Exercise Program Goal: ?Individual exercise prescription set using results from initial 6 min walk test and THRR while considering  patient?s activity barrier

## 2022-02-18 ENCOUNTER — Other Ambulatory Visit (HOSPITAL_COMMUNITY): Payer: Self-pay

## 2022-03-29 ENCOUNTER — Other Ambulatory Visit: Payer: Self-pay | Admitting: Pulmonary Disease

## 2022-04-14 ENCOUNTER — Ambulatory Visit: Payer: Medicare HMO | Admitting: Pulmonary Disease

## 2022-04-29 ENCOUNTER — Telehealth: Payer: Self-pay | Admitting: Acute Care

## 2022-04-29 NOTE — Telephone Encounter (Signed)
Attempted to reach pt to schedule annual LDCT-LVMM 

## 2022-05-26 ENCOUNTER — Telehealth: Payer: Self-pay | Admitting: Pulmonary Disease

## 2022-05-26 NOTE — Telephone Encounter (Signed)
Pending appt 08/03/2022.   Spoke to patient. He stated since weather change, he has developed increased SOB and prod cough with clear to yellow sputum, however sx are managed with current treatment plan.  He is using yuprlri once daily, perforomist BID and pulmicort BID. He has not had to use albuterol.  He stated that he was calling in to reschedule appt only.  Advised patient to call back if sx worsened. He voiced his understanding.  Nothing further needed.   Routing to Dr. Patsey Berthold as an Juluis Rainier.

## 2022-05-26 NOTE — Telephone Encounter (Signed)
Noted  

## 2022-06-02 ENCOUNTER — Telehealth: Payer: Self-pay | Admitting: Pulmonary Disease

## 2022-06-02 NOTE — Telephone Encounter (Signed)
Spoke to patient's daughter in law, [name](DPR). She stated that she received a letter from [employer] that account is on hold. Patient has 3 days to be re certified.  Rodena Piety, can you help with this?

## 2022-06-02 NOTE — Telephone Encounter (Signed)
I spoke with Melissa with [employer] today and she stated she would check into this issue and get back with me

## 2022-06-07 NOTE — Telephone Encounter (Signed)
Anita, do you have an update on this?  

## 2022-06-08 NOTE — Telephone Encounter (Signed)
I left a message for Melissa with [employer] to check on this issue since I have not heard back from her

## 2022-06-08 NOTE — Telephone Encounter (Signed)
Melissa from [employer] called me back and she stated that if they needed anything from the office they would pull it from Epic or call the office for the information needed

## 2022-07-19 ENCOUNTER — Telehealth: Payer: Self-pay | Admitting: Pulmonary Disease

## 2022-07-19 NOTE — Telephone Encounter (Signed)
Patient is aware of below message/recommendations and voiced his understanding.  Nothing further needed.  

## 2022-07-19 NOTE — Telephone Encounter (Signed)
Spoke to patient and verified below message. I recommended that he contact his case worker in reference to programs.  He did not monitor oxygen levels during his trip. He stated that he could just breath better and he did not need oxygen during the day while at the beach.   Dr. Patsey Berthold, please advise. Thanks

## 2022-07-19 NOTE — Telephone Encounter (Signed)
Patient states that when he went to the beach for a week his breathing improved after the first day. He stopped wearing his oxygen during the day and only wore it at night. Patient wanted to ask Dr. Patsey Berthold if she knew of any programs that would help him move or travel to the beach?  Please advise. Call back number is [phone].

## 2022-08-03 ENCOUNTER — Telehealth: Payer: Self-pay | Admitting: *Deleted

## 2022-08-03 ENCOUNTER — Ambulatory Visit (INDEPENDENT_AMBULATORY_CARE_PROVIDER_SITE_OTHER): Payer: Medicare HMO | Admitting: Pulmonary Disease

## 2022-08-03 ENCOUNTER — Encounter: Payer: Self-pay | Admitting: Pulmonary Disease

## 2022-08-03 ENCOUNTER — Telehealth: Payer: Self-pay | Admitting: Pharmacist

## 2022-08-03 VITALS — BP 124/60 | HR 81 | Temp 97.8°F | Ht 71.0 in | Wt 215.8 lb

## 2022-08-03 DIAGNOSIS — G4736 Sleep related hypoventilation in conditions classified elsewhere: Secondary | ICD-10-CM

## 2022-08-03 DIAGNOSIS — I5189 Other ill-defined heart diseases: Secondary | ICD-10-CM | POA: Diagnosis not present

## 2022-08-03 DIAGNOSIS — Z87891 Personal history of nicotine dependence: Secondary | ICD-10-CM

## 2022-08-03 DIAGNOSIS — J449 Chronic obstructive pulmonary disease, unspecified: Secondary | ICD-10-CM | POA: Diagnosis not present

## 2022-08-03 MED ORDER — BREZTRI AEROSPHERE 160-9-4.8 MCG/ACT IN AERO: 2.0000 | INHALATION_SPRAY | Freq: Two times a day (BID) | RESPIRATORY_TRACT | 11 refills | Status: DC

## 2022-08-03 NOTE — Progress Notes (Signed)
Subjective:    Patient ID: Bradley Sawyer, male    DOB: 01/22/1956, 66 y.o.   MRN: [id number] Patient Care Team: Corrington, Delsa Grana, MD as PCP - General (Family Medicine) Elam Dutch, MD (Inactive) as Consulting Physician (Vascular Surgery) Grace Isaac, MD (Inactive) as Consulting Physician (Cardiothoracic Surgery)  Chief Complaint  Patient presents with   Follow-up   [name] is a 66 year old former smoker who presents for follow-up on the issue of COPD with asthma.  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  Asthma is moderate persistent with seasonal variation.  Patient is today for follow-up.  Patient was last seen here on 12 January 2022.  At that time he is participating in home in a rehab.  He was still on the nebulizer medications.  A few weeks ago he decided he would want to go back to try inhaler medications as the nebulizer medications are becoming cumbersome for him to use.  He had some samples of Breztri from his visit at [facility] previously and started using it.  Feels that this helps him just as well as the nebulized medications.  He would like a prescription for it.  He still feels that he is not completely compensated with regards to his breathing.  He continues to exhibit exacerbations at least twice a year particularly with change of seasons.  Since last visit he has not required any hospitalizations.  Had 2 minor exacerbations that were managed at home.  Currently does not endorse any fevers, chills or sweats.  Cough is productive of yellowish sputum  Review of Systems A 10 point review of systems was performed and it is as noted above otherwise negative.    Objective:   Physical Exam BP 124/60 (BP Location: Left Arm, Patient Position: Sitting, Cuff Size: Normal)   Pulse 81   Temp 97.8 F (36.6 C) (Oral)   Ht '5\' 11"'$  (1.803 m)   Wt 215 lb 12.8 oz (97.9 kg)   SpO2 94%   BMI 30.10 kg/m  GENERAL: Well-developed, overweight gentleman, no acute distress.  Disheveled.  HEAD:  Normocephalic, atraumatic. EYES: Pupils equal, round, reactive to light.  No scleral icterus. MOUTH: Edentulous, oral mucosa moist, no thrush. NECK: Supple. No thyromegaly. Trachea midline. No JVD.  No adenopathy. PULMONARY: Good air entry bilaterally.  Coarse breath sounds, rhonchi, rare wheezes noted. CARDIOVASCULAR: S1 and S2. Regular rate and rhythm.  No rubs, murmurs or gallops heard. ABDOMEN: Benign. MUSCULOSKELETAL: No joint deformity, no clubbing, no edema. NEUROLOGIC: No focal deficits, no gait disturbance.  Speech is fluent. SKIN: Intact,warm,dry. PSYCH: Mood and behavior normal.      Assessment & Plan:     ICD-10-CM   1. COPD with asthma ([place])  J44.9    Patient has seasonal/asthmatic component Persistent issues with exacerbations No hospitalizations recently    2. Stage 3 severe COPD by GOLD classification ([place])  J44.9    Patient was switched to [place] at [place] this is working for him Received prescription for [employer] 2 puffs twice a day at his request     3. 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 Grade II diastolic dysfunction  L87.56    This issue adds complexity to his management Follows with cardiology    5. Former heavy cigarette smoker (20-39 per day)  Z87.891    Patient remains abstinent  of cigarettes     Meds ordered this encounter  Medications   Budeson-Glycopyrrol-Formoterol (BREZTRI AEROSPHERE) 160-9-4.8 MCG/ACT AERO    Sig: Inhale 2 puffs into the lungs in the morning and at bedtime.    Dispense:  10.7 g    Refill:  11   We will proceed with getting the patient approved for Dupixent.  Leaves that his asthmatic component will be better control with this medication.  Continue Breztri.  Discontinued nebulized medications with the exception of albuterol as needed/rescue.  We will see  the patient in follow-up in 3 months time he is to contact us prior to that time should any new difficulties arise.   Renold Don, MD Advanced Bronchoscopy PCCM [facility]    *This note was dictated using voice recognition software/Dragon.  Despite best efforts to proofread, errors can occur which can change the meaning. Any transcriptional errors that result from this process are unintentional and may not be fully corrected at the time of dictation.

## 2022-08-03 NOTE — Telephone Encounter (Signed)
Received Dupixent new start forms from [place] locations.  Please start Dupixent BIV pending OV note from 08/03/22 to be signed  Dose: '600mg'$  SQ at Week 0, then '300mg'$  SQ every 14 days thereafter  [place] patient assistance paperwork placed in PAP pending info folder in pharmacy office pending PA determination  Knox Saliva, PharmD, MPH, BCPS, CPP Clinical Pharmacist (Rheumatology and Pulmonology)

## 2022-08-03 NOTE — Telephone Encounter (Signed)
Paperwork started for Dupixent.  Completed by patient and Dr. Patsey Berthold.  Faxed to pharmacy in [place].

## 2022-08-03 NOTE — Patient Instructions (Addendum)
We have discontinued the nebulizer medications except for your rescue albuterol.  Continue using Breztri 2 puffs twice a day.  Continue oxygen at nighttime.  We have started the process to get you enrolled in a medication called Dupixent that will be a shot that is given twice a month. Your first visit for this shot will be [place].  We will see you in follow-up in 2 to 3 months time.

## 2022-08-13 ENCOUNTER — Other Ambulatory Visit (HOSPITAL_COMMUNITY): Payer: Self-pay

## 2022-08-13 MED ORDER — DUPIXENT 300 MG/2ML ~~LOC~~ SOAJ
SUBCUTANEOUS | 0 refills | Status: DC
  Filled 2022-08-13: qty 6, 28d supply, fill #0
  Filled 2022-08-13: qty 8, fill #0

## 2022-08-13 NOTE — Telephone Encounter (Signed)
Delivery instructions have been updated in [place], medication will be couriered to [facility] by 08/17/22.  Rx has been processed in [facility] and the patient has no copay at this time.

## 2022-08-13 NOTE — Telephone Encounter (Signed)
Received notification from [facility] regarding a prior authorization for [place]. Authorization has been APPROVED from 08/13/22 to 11/21/22.   Per test claim, copay for 28 days supply for loading dose is $0  Patient can fill through [place]: [phone]   Authorization # [id number]  Approval letter sent to media tab of patient's chart  Rx for Dupixent sent to [facility] to be couriered to clinic by 08/19/22. Routing to [facility] for onboarding needs.  Knox Saliva, PharmD, MPH, BCPS, CPP Clinical Pharmacist (Rheumatology and Pulmonology)

## 2022-08-13 NOTE — Telephone Encounter (Signed)
Submitted a Prior Authorization request to [facility] for [place] via CoverMyMeds. Will update once we receive a response.  Key: Dawayne Cirri, PharmD, MPH, BCPS, CPP Clinical Pharmacist (Rheumatology and Pulmonology)

## 2022-08-16 ENCOUNTER — Other Ambulatory Visit (HOSPITAL_COMMUNITY): Payer: Self-pay

## 2022-08-16 NOTE — Progress Notes (Unsigned)
HPI Patient presents today to [facility] to see pharmacy team for [place] new start.  Past medical history includes COPD, unstable angina, CAD, PAD, HTN, and tobacco use  Number of COPD exacerbations in past year: 2  Respiratory Medications Current regimen: Breztri Aerosphere 160-9-4.82 puffs bid, montelukast '10mg'$  once daily Tried in past: Yupelri 145mg/3mL, Brovana 187m/46mL, budesonide 0.'25mg'$ /46m81mAdvair Diskus 500-10m41mDulera 200-5mcg105merforomist 20mcg846m, Spiriva 18 mcg Patient reports {Adherence challenges yes no:[id number]::"adherence challenges","no known adherence challenges"}  OBJECTIVE Allergies  Allergen Reactions   Bee Venom Swelling    Outpatient Encounter Medications as of 08/18/2022  Medication Sig Note   albuterol (PROVENTIL) (2.5 MG/3ML) 0.083% nebulizer solution USE 1 AMPULE IN NEBULIZER EVERY 6 HOURS AS NEEDED.    amLODipine (NORVASC) 5 MG tablet Take 5 mg by mouth daily.    aspirin EC 81 MG tablet Take 81 mg by mouth daily.    atorvastatin (LIPITOR) 80 MG tablet Take 80 mg by mouth daily.    Budeson-Glycopyrrol-Formoterol (BREZTRI AEROSPHERE) 160-9-4.8 MCG/ACT AERO Inhale 2 puffs into the lungs in the morning and at bedtime.    clopidogrel (PLAVIX) 75 MG tablet Take 75 mg by mouth daily.    Dupilumab (DUPIXENT) 300 MG/2ML SOPN Inject '600mg'$  into the skin at Week 0, then '300mg'$  into the skin every 14 days thereafter. Courier to pulm: [id number] [place] [phone] on 08/19/22    EPINEPHrine 0.3 mg/0.3 mL IJ SOAJ injection Inject 0.3 mg into the muscle as needed for anaphylaxis.    famotidine (PEPCID) 40 MG tablet Take by mouth.    FLUoxetine (PROZAC) 20 MG capsule Take 20 mg by mouth daily.    isosorbide mononitrate (IMDUR) 30 MG 24 hr tablet Take 30 mg by mouth daily.    levocetirizine (XYZAL) 5 MG tablet Take 5 mg by mouth at bedtime.    linaclotide (LINZESS) 290 MCG CAPS capsule Take 290 mcg by mouth daily before breakfast.     lisinopril-hydrochlorothiazide (ZESTORETIC) 20-25 MG tablet Take 1 tablet by mouth daily.    meloxicam (MOBIC) 15 MG tablet TAKE 1 TABLET IN THE MORNING WITH FOOD    metFORMIN (GLUCOPHAGE) 500 MG tablet Take 500 mg by mouth daily.    montelukast (SINGULAIR) 10 MG tablet Take 10 mg by mouth daily.    Multiple Vitamin (MULTI-VITAMINS) TABS Take 1 tablet by mouth daily.    naloxone (NARCAN) nasal spray 4 mg/0.1 mL Place 0.4 mg into the nose once. 03/23/2017: Patient states has not had to use this   nitroGLYCERIN (NITROSTAT) 0.4 MG SL tablet Place 0.4 mg under the tongue every 5 (five) minutes as needed for chest pain.    Olopatadine HCl 0.2 % SOLN INSTILL 1 DROPS INTO EACH EYE ONCE DAILY AS NEEDED FOR ITCHY EYES.    ondansetron (ZOFRAN) 4 MG tablet Take by mouth.    oxyCODONE (ROXICODONE) 15 MG immediate release tablet Take by mouth.    pantoprazole (PROTONIX) 40 MG tablet Take 1 tablet by mouth daily.    polyethylene glycol (MIRALAX / GLYCOLAX) packet Take 17 g by mouth daily as needed (for constipation).    potassium chloride SA (KLOR-CON) 20 MEQ tablet Take 1 tablet (20 mEq total) by mouth daily for 3 days.    predniSONE (STERAPRED UNI-PAK 21 TAB) 10 MG (21) TBPK tablet Take as directed in the package    pregabalin (LYRICA) 150 MG capsule Take 150 mg by mouth daily.    PROAIR HFA 108 (90 BASE) MCG/ACT inhaler  INHALE 2 PUFFS INTO THE LUNGS EVERY 6 (SIX) HOURS AS NEEDED FOR WHEEZING. (Patient taking differently: Inhale 2 puffs into the lungs every 6 (six) hours as needed for shortness of breath or wheezing.)    rOPINIRole (REQUIP) 1 MG tablet Take 1 tablet by mouth at bedtime.    spironolactone (ALDACTONE) 25 MG tablet Take 25 mg by mouth daily.    tamsulosin (FLOMAX) 0.4 MG CAPS capsule TAKE (1) CAPSULE BY MOUTH ONCE DAILY.    Facility-Administered Encounter Medications as of 08/18/2022  Medication   sodium chloride flush (NS) 0.9 % injection 3 mL     Immunization History  Administered Date(s)  Administered   DTaP 12/04/2018   Fluad Quad(high Dose 65+) 10/08/2021   Influenza, High Dose Seasonal PF 10/08/2021   Influenza, Seasonal, Injecte, Preservative Fre 08/01/2013, 08/12/2014, 08/26/2015, 08/06/2016, 09/26/2019   Influenza,inj,Quad PF,6+ Mos 08/01/2013, 08/12/2014   Influenza-Unspecified 10/30/2018, 09/26/2019, 09/10/2020   Moderna Sars-Covid-2 Vaccination 02/06/2020, 03/05/2020   Pneumococcal Polysaccharide-23 10/20/2014   Pneumococcal-Unspecified 10/20/2014     PFTs    Latest Ref Rng & Units 10/19/2020   11:26 AM  PFT Results  FVC-Pre L 3.57   FVC-Predicted Pre % 74   FVC-Post L 3.44   FVC-Predicted Post % 71   Pre FEV1/FVC % % 51   Post FEV1/FCV % % 47   FEV1-Pre L 1.83   FEV1-Predicted Pre % 50   FEV1-Post L 1.63   DLCO uncorrected ml/min/mmHg 15.22   DLCO UNC% % 54   DLVA Predicted % 66   TLC L 6.88   TLC % Predicted % 95   RV % Predicted % 126      Eosinophils Most recent blood eosinophil count was 100 cells/microL taken on 09/20/2021.   IgE: 194 on 09/04/2021   Assessment   Biologics training for dupilumab (Dupixent)  Goals of therapy: Mechanism: human monoclonal IgG4 antibody that inhibits interleukin-4 and interleukin-13 cytokine-induced responses, including release of proinflammatory cytokines, chemokines, and IgE Reviewed that Dupixent is add-on medication and patient must continue maintenance inhaler regimen. Response to therapy: may take 4 months to determine efficacy. Discussed that patients generally feel improvement sooner than 4 months.  Side effects: injection site reaction (6-18%), antibody development (5-16%), ophthalmic conjunctivitis (2-16%), transient blood eosinophilia (1-2%)  Dose: '600mg'$  at Week 0 (administered today in clinic) followed by '300mg'$  every 14 days thereafter  Administration/Storage:  Reviewed administration sites of thigh or abdomen (at least 2-3 inches away from abdomen). Reviewed the upper arm is only  appropriate if caregiver is administering injection  Do not shake pen/syringe as this could lead to product foaming or precipitation. Do not use if solution is discolored or contains particulate matter or if window on prefilled pen is yellow (indicates pen has been used).  Reviewed storage of medication in refrigerator. Reviewed that [place] can be stored at room temperature in unopened carton for up to 14 days.  Access: Approval of Dupixent through: insurance   Patient self-administered Dupixent '300mg'$ /50m x 2 (total dose '600mg'$ ) in {injsitedsg:28167} and {injsitedsg:28167} using sample Dupixent '300mg'$ /266mautoinjector pen NDC: *** Lot: *** Expiration: ***  Patient monitored for 30 minutes for adverse reaction.  Patient tolerated ***. Injection site checked and no ***.  Medication Reconciliation  A drug regimen assessment was performed, including review of allergies, interactions, disease-state management, dosing and immunization history. Medications were reviewed with the patient, including name, instructions, indication, goals of therapy, potential side effects, importance of adherence, and safe use.  Drug interaction(s): none  Immunizations  Patient is indicated for the influenzae, pneumonia, and shingles vaccinations. Patient has received 2 COVID19 vaccines.   PLAN Continue Dupixent '300mg'$  every 14 days.  Next dose is due 10/11 and every 14 days thereafter. Rx sent to: [facility]: [phone] .  Patient provided with pharmacy phone number and advised to call later this week to schedule shipment to home. Patient provided with copay card information to provide to pharmacy if quoted copay exceeds $5 per month. Continue maintenance asthma regimen of: Breztri Aerosphere 160-9-4.82 puffs bid, montelukast '10mg'$  once daily  All questions encouraged and answered.  Instructed patient to reach out with any further questions or concerns.  Thank you for  allowing pharmacy to participate in this patient's care.  This appointment required *** minutes of patient care (this includes precharting, chart review, review of results, face-to-face care, etc.).

## 2022-08-16 NOTE — Telephone Encounter (Signed)
Patient scheduled for Dupixent new start on 08/18/22. He will come with his daughter in law.  Knox Saliva, PharmD, MPH, BCPS, CPP Clinical Pharmacist (Rheumatology and Pulmonology)

## 2022-08-17 ENCOUNTER — Other Ambulatory Visit (HOSPITAL_COMMUNITY): Payer: Self-pay

## 2022-08-17 NOTE — Telephone Encounter (Signed)
Dupixent received from [facility]. Placed in Wilson, PharmD, MPH, BCPS, CPP Clinical Pharmacist (Rheumatology and Pulmonology)

## 2022-08-18 ENCOUNTER — Ambulatory Visit: Payer: Medicare HMO | Admitting: Pharmacist

## 2022-08-18 ENCOUNTER — Other Ambulatory Visit (HOSPITAL_COMMUNITY): Payer: Self-pay

## 2022-08-18 DIAGNOSIS — J449 Chronic obstructive pulmonary disease, unspecified: Secondary | ICD-10-CM

## 2022-08-18 DIAGNOSIS — Z7189 Other specified counseling: Secondary | ICD-10-CM

## 2022-08-18 MED ORDER — DUPIXENT 300 MG/2ML ~~LOC~~ SOAJ
300.0000 mg | SUBCUTANEOUS | 0 refills | Status: DC
  Filled 2022-08-18: qty 12, 84d supply, fill #0
  Filled 2022-09-07: qty 4, 28d supply, fill #0
  Filled 2022-10-06: qty 4, 28d supply, fill #1
  Filled 2022-11-08: qty 4, 28d supply, fill #2

## 2022-08-18 NOTE — Patient Instructions (Signed)
Your next Dupixent dose is due on 10/11, 10/25, and every 11/8 days thereafter  CONTINUE Breztri Aerosphere 160-9-4.82 puffs bid, montelukast '10mg'$  once daily  Your prescription will be shipped from [facility]. Their phone number is [phone] They should call to schedule shipment and confirm address. They will mail your medication to your home.  You will need to be seen by your provider in 3 to 4 months to assess how Dupixent is working for you. Please ensure you have a follow-up appointment scheduled in December/January. Call our clinic if you need to make this appointment.  How to manage an injection site reaction: Remember the 5 C's: COUNTER - leave on the counter at least 30 minutes but up to overnight to bring medication to room temperature. This may help prevent stinging COLD - place something cold (like an ice gel pack or cold water bottle) on the injection site just before cleansing with alcohol. This may help reduce pain CLARITIN - use Claritin (generic name is loratadine) for the first two weeks of treatment or the day of, the day before, and the day after injecting. This will help to minimize injection site reactions CORTISONE CREAM - apply if injection site is irritated and itching CALL ME - if injection site reaction is bigger than the size of your fist, looks infected, blisters, or if you develop hives

## 2022-08-26 ENCOUNTER — Other Ambulatory Visit (HOSPITAL_COMMUNITY): Payer: Self-pay

## 2022-09-07 ENCOUNTER — Other Ambulatory Visit (HOSPITAL_COMMUNITY): Payer: Self-pay

## 2022-09-08 ENCOUNTER — Other Ambulatory Visit (HOSPITAL_COMMUNITY): Payer: Self-pay

## 2022-09-09 ENCOUNTER — Other Ambulatory Visit (HOSPITAL_COMMUNITY): Payer: Self-pay

## 2022-10-04 ENCOUNTER — Other Ambulatory Visit (HOSPITAL_COMMUNITY): Payer: Self-pay

## 2022-10-06 ENCOUNTER — Other Ambulatory Visit (HOSPITAL_COMMUNITY): Payer: Self-pay

## 2022-10-11 ENCOUNTER — Other Ambulatory Visit (HOSPITAL_COMMUNITY): Payer: Self-pay

## 2022-10-28 ENCOUNTER — Ambulatory Visit
Admission: EM | Admit: 2022-10-28 | Discharge: 2022-10-28 | Disposition: A | Discharge status: Home / Self Care | Payer: Medicare HMO | Attending: Internal Medicine | Admitting: Internal Medicine

## 2022-10-28 ENCOUNTER — Ambulatory Visit: Payer: Self-pay

## 2022-10-28 DIAGNOSIS — N10 Acute tubulo-interstitial nephritis: Secondary | ICD-10-CM

## 2022-10-28 LAB — URINALYSIS, ROUTINE W REFLEX MICROSCOPIC
Bilirubin Urine: NEGATIVE
Glucose, UA: NEGATIVE mg/dL
Ketones, ur: NEGATIVE mg/dL
Nitrite: POSITIVE — AB
Protein, ur: NEGATIVE mg/dL
Specific Gravity, Urine: 1.02 (ref 1.005–1.030)
pH: 5.5 (ref 5.0–8.0)

## 2022-10-28 LAB — URINALYSIS, MICROSCOPIC (REFLEX): WBC, UA: >50 WBC/hpf (ref 0–5)

## 2022-10-28 MED ORDER — CIPROFLOXACIN HCL 500 MG PO TABS
500.0000 mg | ORAL_TABLET | ORAL | Status: AC
  Administered 2022-10-28: 500 mg via ORAL

## 2022-10-28 MED ORDER — CIPROFLOXACIN HCL 500 MG PO TABS: 500.0000 mg | ORAL_TABLET | Freq: Two times a day (BID) | ORAL | 0 refills | Status: DC

## 2022-10-28 NOTE — ED Triage Notes (Signed)
Chief Complaint: groin pain. States has chronic pain that he is on meds for. States starting to have sharp pain in the groin area. Does have a history of hernias. No urinary symptoms, has a weak flow according to the Patient, states he has an enlarged prostate. No falls or injuries. No swelling that the Patient has noticed.   Onset: Tuesday afternoon  Prescriptions or OTC medications tried: Yes- RX pain meds     with little relief

## 2022-10-28 NOTE — ED Provider Notes (Addendum)
[facility]    CSN: [id number] Arrival date & time: 10/28/22  1839      History   Chief Complaint Chief Complaint  Patient presents with   Groin Pain    [name] is a 66 y.o. male who presents with his granddaughter due to developing L groin pain, radiating to his L abdomen and flank x 2 days. He denies dysuria, but has BPH and has decreased urinary flow which is unchanged. He denies noticing cloudy urine, dark brown urine or hx of renal stones.     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   Compression fracture    L2   COPD (chronic obstructive pulmonary disease) (HCC)    bronchitis   CVA (cerebral infarction)    Disorder of vocal cord    Fatty liver    GERD (gastroesophageal reflux disease)    Hx of cardiovascular stress test    ETT/Lexiscan-Myoview (10/15):  Inferior, inferoapical, apical lateral defect c/w mild ischemia, EF 66%; Intermediate Risk   Hyperlipidemia    Hypertension    Right shoulder pain    2/2 partial rotator cuff tear, tendinopathy, mild subacromial/subdeltioid bursitis, A/C joint arthropathy per MRI done in 3/07   Tobacco abuse     Patient Active Problem List   Diagnosis Date Noted   Chronic respiratory failure with hypoxia ([place]) 03/30/2021   COPD exacerbation ([place]) 02/27/2021   History of CVA (cerebrovascular accident) 02/27/2021   Chronic pain, radicular 02/27/2021   Chronic, continuous use of opioids 02/27/2021   Unstable angina ([place]) 02/19/2021   Cigarette nicotine dependence without complication 74/94/4967   Acute lower UTI 02/19/2017   UTI (urinary tract infection) 02/19/2017   Left inguinal hernia 08/12/2016   Elevated troponin 05/08/2016   Anal fissure 03/15/2016   External hemorrhoid 03/15/2016   Acute viral pharyngitis 03/20/2015   Olecranon bursitis of  left elbow 59/16/3846   Embolic stroke ([place]) 65/99/3570   Carotid stenosis 10/15/2014   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[place]) 09/19/2014   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   PAD (peripheral artery disease) ([place]) 08/23/2014   Preventative health care 08/12/2014   Occlusion and stenosis of carotid artery without mention of cerebral infarction 07/25/2014   Subclavian steal syndrome 07/25/2014   CAD (coronary artery disease) 07/10/2014   Nocturia 04/17/2014   Testicular pain, right 04/17/2014   Urethral stricture 04/17/2014   CVA (cerebral infarction) 04/10/2014   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   Hematuria 08/06/2013   Compression fracture of L2, with back and right groin pain 07/18/2013   History of colon polyps 05/18/2012   COPD (chronic obstructive pulmonary disease) ([place]) 12/24/2009   Hyperlipemia 09/20/2006   TOBACCO ABUSE 09/20/2006   Essential hypertension 09/20/2006   GERD 09/20/2006    Past Surgical History:  Procedure Laterality Date   ANTERIOR LAT LUMBAR FUSION N/A 12/18/2013   Procedure: Lumbar Two Anteriorlateral Corpectomy w/ cage, interbody fusion, anterior plating, Lumbar One to Lumbar Three percutaneous pedicle screws;  Surgeon: Charlie Pitter, MD;  Location: [facility];  Service: Neurosurgery;  Laterality: N/A;   AORTA -INNOMIATE BYPASS N/A 10/15/2014   Procedure: ENDARTERCTOMY OF  INNOMINATE ARTERY ;  Surgeon: Elam Dutch, MD;  Location: [place];  Service: Vascular;  Laterality: N/A;   ARCH AORTOGRAM N/A 08/23/2014  Procedure: ARCH AORTOGRAM;  Surgeon: Elam Dutch, MD;  Location: [facility] CATH LAB;  Service: Cardiovascular;  Laterality: N/A;   arch aortogram, left carotid,left subclavian angiogram  08/23/2014   CAROTID ANGIOGRAM Left 08/23/2014   Procedure: CAROTID ANGIOGRAM;  Surgeon: Elam Dutch, MD;  Location: [facility] CATH LAB;  Service: Cardiovascular;  Laterality: Left;   CHOLECYSTECTOMY  12/2016    ENDARTERECTOMY Right 10/15/2014   Procedure: ENDARTERECTOMY RIGHT SUBCLAVIAN ARTERY;  Surgeon: Elam Dutch, MD;  Location: [place];  Service: Vascular;  Laterality: Right;   ENDARTERECTOMY Right 10/15/2014   Procedure: ENDARTERECTOMY RIGHT COMMON CAROTID ARTERY;  Surgeon: Elam Dutch, MD;  Location: [place];  Service: Vascular;  Laterality: Right;   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   HERNIA REPAIR     LEFT HEART CATH AND CORONARY ANGIOGRAPHY N/A 03/17/2021   Procedure: LEFT HEART CATH AND CORONARY ANGIOGRAPHY;  Surgeon: Dionisio David, MD;  Location: [place] CV LAB;  Service: Cardiovascular;  Laterality: N/A;   LEFT HEART CATHETERIZATION WITH CORONARY ANGIOGRAM N/A 09/12/2014   Procedure: LEFT HEART CATHETERIZATION WITH CORONARY ANGIOGRAM;  Surgeon: Josue Hector, MD;  Location: [facility] CATH LAB;  Service: Cardiovascular;  Laterality: N/A;   LUMBAR SPINE SURGERY  12/18/2013   L2     DR POOL   NECK SURGERY     PATCH ANGIOPLASTY Right 10/15/2014   Procedure: PATCH ANGIOPLASTY OF RIGHT SUBCLAVIAN ARTERY , RIGHT COMMON CAROTID ARTERY & INNOMINATE ARTERY;  Surgeon: Elam Dutch, MD;  Location: [place];  Service: Vascular;  Laterality: Right;   STERNOTOMY N/A 10/15/2014   Procedure: PARTIAL STERNOTOMY & PLATING OF STERNUM;  Surgeon: Elam Dutch, MD;  Location: [place];  Service: Vascular;  Laterality: N/A;   TEE WITHOUT CARDIOVERSION N/A 12/25/2013   Procedure: TRANSESOPHAGEAL ECHOCARDIOGRAM (TEE);  Surgeon: Larey Dresser, MD;  Location: [place];  Service: Cardiovascular;  Laterality: N/A;       Home Medications    Prior to Admission medications   Medication Sig Start Date End Date Taking? Authorizing Provider  amLODipine (NORVASC) 5 MG tablet Take 5 mg by mouth daily.   Yes [provider]  aspirin EC 81 MG tablet Take 81 mg by mouth daily.   Yes [provider]  atorvastatin (LIPITOR) 80 MG tablet Take 80 mg by mouth daily. 05/17/20  Yes  [provider]  Budeson-Glycopyrrol-Formoterol (BREZTRI AEROSPHERE) 160-9-4.8 MCG/ACT AERO Inhale 2 puffs into the lungs in the morning and at bedtime. 08/03/22  Yes Tyler Pita, MD  ciprofloxacin (CIPRO) 500 MG tablet Take 1 tablet (500 mg total) by mouth 2 (two) times daily. 10/28/22  Yes Rodriguez-Southworth, Sunday Spillers, PA-C  clopidogrel (PLAVIX) 75 MG tablet Take 75 mg by mouth daily. 02/14/21  Yes [provider]  Dupilumab (DUPIXENT) 300 MG/2ML SOPN Inject 300 mg into the skin every 14 (fourteen) days. 08/18/22  Yes Tyler Pita, MD  EPINEPHrine 0.3 mg/0.3 mL IJ SOAJ injection Inject 0.3 mg into the muscle as needed for anaphylaxis. 04/04/20  Yes [provider]  famotidine (PEPCID) 40 MG tablet Take by mouth. 01/29/22  Yes [provider]  FLUoxetine (PROZAC) 20 MG capsule Take 20 mg by mouth daily. 06/01/21  Yes [provider]  isosorbide mononitrate (IMDUR) 30 MG 24 hr tablet Take 30 mg by mouth daily.   Yes [provider]  levocetirizine (XYZAL) 5 MG tablet Take 5 mg by mouth at bedtime. 07/15/20  Yes [provider]  linaclotide (  LINZESS) 290 MCG CAPS capsule Take 290 mcg by mouth daily before breakfast. 12/05/19  Yes [provider]  lisinopril-hydrochlorothiazide (ZESTORETIC) 20-25 MG tablet Take 1 tablet by mouth daily. 06/01/21  Yes [provider]  metFORMIN (GLUCOPHAGE) 500 MG tablet Take 500 mg by mouth daily. 07/22/20  Yes [provider]  montelukast (SINGULAIR) 10 MG tablet Take 10 mg by mouth daily. 07/15/20  Yes [provider]  Multiple Vitamin (MULTI-VITAMINS) TABS Take 1 tablet by mouth daily.   Yes [provider]  nitroGLYCERIN (NITROSTAT) 0.4 MG SL tablet Place 0.4 mg under the tongue every 5 (five) minutes as needed for chest pain. 12/12/20  Yes [provider]  ondansetron (ZOFRAN) 4 MG tablet Take by mouth. 02/04/22  Yes [provider]   oxyCODONE (ROXICODONE) 15 MG immediate release tablet Take by mouth. 10/09/21  Yes [provider]  pantoprazole (PROTONIX) 40 MG tablet Take 1 tablet by mouth daily. 06/01/21  Yes [provider]  polyethylene glycol (MIRALAX / GLYCOLAX) packet Take 17 g by mouth daily as needed (for constipation).   Yes [provider]  pregabalin (LYRICA) 150 MG capsule Take 150 mg by mouth daily. 09/20/19  Yes [provider]  rOPINIRole (REQUIP) 1 MG tablet Take 1 tablet by mouth at bedtime. 06/01/21  Yes [provider]  spironolactone (ALDACTONE) 25 MG tablet Take 25 mg by mouth daily. 05/26/21  Yes [provider]  tamsulosin (FLOMAX) 0.4 MG CAPS capsule TAKE (1) CAPSULE BY MOUTH ONCE DAILY. 11/05/21  Yes McGowan, Larene [place] A, PA-C  albuterol (PROVENTIL) (2.5 MG/3ML) 0.083% nebulizer solution USE 1 AMPULE IN NEBULIZER EVERY 6 HOURS AS NEEDED. 06/29/21   Tyler Pita, MD  naloxone [facility]) nasal spray 4 mg/0.1 mL Place 0.4 mg into the nose once. 09/02/16   [provider]  PROAIR HFA 108 (90 BASE) MCG/ACT inhaler INHALE 2 PUFFS INTO THE LUNGS EVERY 6 (SIX) HOURS AS NEEDED FOR WHEEZING. Patient taking differently: Inhale 2 puffs into the lungs every 6 (six) hours as needed for shortness of breath or wheezing. 05/01/15   Rivet, Sindy Guadeloupe, MD    Family History Family History  Problem Relation Age of Onset   Heart disease Father    Heart attack Father    Diabetes Paternal Grandmother        57 st degree relatives   Hypertension Paternal Grandmother    Diabetes Brother    Heart disease Brother    Muscular dystrophy Brother    Muscular dystrophy Sister    Prostate cancer Neg Hx    Kidney cancer Neg Hx    Bladder Cancer Neg Hx     Social History Social History   Tobacco Use   Smoking status: Former    Packs/day: 2.00    Years: 48.00    Total pack years: 96.00    Types: Cigarettes    Quit date: 11/22/2020    Years since quitting: 1.9    Smokeless tobacco: Never  Vaping Use   Vaping Use: Never used  Substance Use Topics   Alcohol use: No    Alcohol/week: 0.0 standard drinks of alcohol   Drug use: No    Types: Cocaine, Marijuana    Comment: no recent use     Allergies   Bee venom   Review of Systems Review of Systems  Constitutional:  Negative for fever.  Gastrointestinal:  Positive for abdominal pain. Negative for constipation.       Takes stool softeners to  prevent constipation from opiates  Genitourinary:  Positive for flank pain and frequency. Negative for dysuria, hematuria, penile swelling, scrotal swelling, testicular pain and urgency.  Musculoskeletal:  Positive for gait problem.       Has chronic back pain  Hematological:  Negative for adenopathy.     Physical Exam Triage Vital Signs ED Triage Vitals  Enc Vitals Group     BP 10/28/22 1853 (!) 152/95     Pulse Rate 10/28/22 1853 64     Resp 10/28/22 1853 16     Temp 10/28/22 1853 98.2 F (36.8 C)     Temp Source 10/28/22 1853 Oral     SpO2 10/28/22 1853 97 %     Weight --      Height --      Head Circumference --      Peak Flow --      Pain Score 10/28/22 1852 10     Pain Loc --      Pain Edu? --      Excl. in [place]? --    No data found.  Updated Vital Signs BP (!) 152/95 (BP Location: Left Arm)   Pulse 64   Temp 98.2 F (36.8 C) (Oral)   Resp 16   SpO2 97%   Visual Acuity Right Eye Distance:   Left Eye Distance:   Bilateral Distance:    Right Eye Near:   Left Eye Near:    Bilateral Near:      Physical Exam Vitals and nursing note reviewed.  Constitutional:      General: he is not in acute distress.    Appearance: he is not toxic-appearing.  HENT:     Head: Normocephalic.     Right Ear: External ear normal.     Left Ear: External ear normal.  Eyes:     General: No scleral icterus.    Conjunctiva/sclera: Conjunctivae normal.  Pulmonary:     Effort: Pulmonary effort is normal.  Abdominal:     General: Bowel sounds  are normal.     Palpations: Abdomen is soft. There is no mass. Has mild tenderness on L mid abdomen    Tenderness: There is no guarding or rebound.     Comments: + CVA tenderness  on the L Musculoskeletal:        General: moves very slow and uses a cane to ambulate    Cervical back: Neck supple.     Comments: BACK- no muscular tenderness on area of complaint with palpation  Skin:    General: Skin is warm and dry.     Findings: No rash.  Neurological:     Mental Status: he is alert and oriented to person, place, and time.     Gait: Gait normal.  Psychiatric:        Mood and Affect: Mood normal.        Behavior: Behavior normal.        Thought Content: Thought content normal.        Judgment: Judgment normal.    UC Treatments / Results  Labs (all labs ordered are listed, but only abnormal results are displayed) Labs Reviewed  URINALYSIS, ROUTINE W REFLEX MICROSCOPIC - Abnormal; Notable for the following components:      Result Value   APPearance CLOUDY (*)    Hgb urine dipstick MODERATE (*)    Nitrite POSITIVE (*)    Leukocytes,Ua SMALL (*)    All other components within normal limits  URINALYSIS, MICROSCOPIC (REFLEX) -  Abnormal; Notable for the following components:   Bacteria, UA MANY (*)    All other components within normal limits  URINE CULTURE    EKG   Radiology No results found.  Procedures Procedures (including critical care time)  Medications Ordered in UC Medications  ciprofloxacin (CIPRO) tablet 500 mg (500 mg Oral Given 10/28/22 2034)    Initial Impression / Assessment and Plan / UC Course  I have reviewed the triage vital signs and the nursing notes.  Pertinent labs  results that were available during my care of the patient were reviewed by me and considered in my medical decision making (see chart for details). Urine culture was ordered.   L pyelonephritis  I placed him on Cipro as noted. Told if he gets worse in 24 hours, needs to go to ER to  r/o renal stone. Granddaughter and pt understand.    Final Clinical Impressions(s) / UC Diagnoses  L pyelonephritis Final diagnoses:  Acute pyelonephritis     Discharge Instructions      Your urine has moderate amount of microscopic blood, and signs of infection. You may have a kidney stone and kidney infection on the L, so I will start you on antibiotics. If after 2 doses the pain gets worse, go to the ER to have a cat scan to rule out kidney stone.   We are sending the urine for a culture and if we need to make changes in your medication, we will call you.     ED Prescriptions     Medication Sig Dispense Auth. Provider   ciprofloxacin (CIPRO) 500 MG tablet Take 1 tablet (500 mg total) by mouth 2 (two) times daily. 14 tablet Rodriguez-Southworth, Sunday Spillers, PA-C      PDMP not reviewed this encounter.   Shelby Mattocks, Hershal Coria 10/28/22 2043    Rodriguez-Southworth, [place], PA-C 10/28/22 2044

## 2022-10-28 NOTE — Discharge Instructions (Addendum)
Your urine has moderate amount of microscopic blood, and signs of infection. You may have a kidney stone and kidney infection on the L, so I will start you on antibiotics. If after 2 doses the pain gets worse, go to the ER to have a cat scan to rule out kidney stone.   We are sending the urine for a culture and if we need to make changes in your medication, we will call you.

## 2022-10-31 LAB — URINE CULTURE: Culture: >=100000 — AB

## 2022-11-01 NOTE — Progress Notes (Deleted)
11/02/2022 10:43 AM   Bradley Sawyer May 04, 1956 [id number]  Referring provider: Curly Rim, MD [place] [address],  [place] [address]  Urological history 1. High risk hematuria -hx of gross heme - former smoker - Contrast CT in 01/2017 revealed a small cyst at inferior pole RIGHT kidney 2.2 x 1.7 cm image 41. Adrenal glands, kidneys, ureters, and bladder otherwise normal appearance.  Minimal prostatic enlargement with scattered prostatic calcifications. Seminal vesicles unremarkable - Cystoscopy in 03/2017 with Dr. Pilar Jarvis noted an enlarged prostate Visually obstructive - 4 cm in length - CTU 09/13/2019 The prostate gland indents the bladder base.  Adrenal glands normal. 2.6 by 2.1 cm right kidney lower pole fluid density lesion without enhancement, compatible with simple cyst.  Small vascular calcification in the left renal hilum. No urinary tract calculus observed. No significant abnormal filling defect or abnormal enhancement along the urothelium.  The prostate gland measures 5.2 by 4.2 by 5.2 cm (volume = 59 cm^3) and indents the bladder base. Similar pattern of left posterolateral peripheral zone calcifications near the apex - Cysto with Dr. Bernardo Heater 09/2019 Prominent lateral lobe enlargement prostate, hypervascularity and moderate median lobe - no reports of gross heme - UA ***  2. Orchitis/hydrocele - Scrotal ultrasound in 03/2017 revealed no evidence of testicular torsion.  New rounded hypoechoic lesion in upper left testicle which shows mild hypervascularity. This is suspicious for orchitis given evidence of associated epididymitis and its new appearance since prior study approximately 1 month ago. Testicular neoplasm is considered less likely, and follow-up by ultrasound is recommended in 2-3 months.  Probable mild left epididymitis.  Moderate complex left hydrocele, and small right hydrocele - Tumor markers were negative - sought a second opinion regarding our  recommendation in 05/2017 with urology in [facility] and they also recommended a follow up scrotal ultrasound which he did not follow up with the repeat imaging - SUS 08/2019 Normal-appearing testicles bilaterally. The previously seen area of decreased echogenicity within the left testicle as resolved.  Epididymal cysts bilaterally stable in appearance from the prior exam.  Bilateral hydroceles are noted although improved from the prior study particularly on the left.  3. BPH with LU TS - PSA (01/2022) 0.2  - tamsulosin 0.4 mg daily    HPI: Bradley Sawyer is a 66 y.o. male who presents today for groin pain.    He was seen at [facility] urgent care on October 28, 2022 for 2-day history of left groin pain radiating to his left abdomen and left flank.  His urinalysis was yellow cloudy, specific gravity 1.020, pH of 5.5, moderate hemoglobin, positive nitrate, small leukocyte, 6-10 RBCs, greater than 50 WBCs, many bacteria, 6-10 squamous epithelial cells and white blood cell clumps are present.  Urine culture was positive for E. coli.  He was placed on Cipro 500 mg twice daily for 7 days, but culture results revealed that the bacteria is resistant to this antibiotic.  He was not improving, likely because his infection was resistant to the Cipro, so he was seen on October 30, 2022 at the [facility] emergency room.  Urinalysis was yellow clear, specific gravity 1.022, pH 5.5, large leukocyte, nitrate positive, trace protein, moderate blood, 13 RBCs, 66 WBCs, 1 squame epithelial cell, many bacteria and occasional mucus.  WBC count was 12.1.  Serum creatinine was 0.91.  Contrast CT 3 cm right renal cyst, no hydronephrosis, no solid renal masses and new stones.  His Cipro was discontinued and he  was placed on Cefpodoxine.    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   Compression fracture    L2   COPD (chronic obstructive pulmonary disease) (HCC)    bronchitis   CVA (cerebral infarction)    Disorder of vocal cord    Fatty liver    GERD (gastroesophageal reflux disease)    Hx of cardiovascular stress test    ETT/Lexiscan-Myoview (10/15):  Inferior, inferoapical, apical lateral defect c/w mild ischemia, EF 66%; Intermediate Risk   Hyperlipidemia    Hypertension    Right shoulder pain    2/2 partial rotator cuff tear, tendinopathy, mild subacromial/subdeltioid bursitis, A/C joint arthropathy per MRI done in 3/07   Tobacco abuse     Surgical History: Past Surgical History:  Procedure Laterality Date   ANTERIOR LAT LUMBAR FUSION N/A 12/18/2013   Procedure: Lumbar Two Anteriorlateral Corpectomy w/ cage, interbody fusion, anterior plating, Lumbar One to Lumbar Three percutaneous pedicle screws;  Surgeon: Charlie Pitter, MD;  Location: [place] NEURO ORS;  Service: Neurosurgery;  Laterality: N/A;   AORTA -INNOMIATE BYPASS N/A 10/15/2014   Procedure: ENDARTERCTOMY OF  INNOMINATE ARTERY ;  Surgeon: Elam Dutch, MD;  Location: [place];  Service: Vascular;  Laterality: N/A;   ARCH AORTOGRAM N/A 08/23/2014   Procedure: ARCH AORTOGRAM;  Surgeon: Elam Dutch, MD;  Location: [facility] CATH LAB;  Service: Cardiovascular;  Laterality: N/A;   arch aortogram, left carotid,left subclavian angiogram  08/23/2014   CAROTID ANGIOGRAM Left 08/23/2014   Procedure: CAROTID ANGIOGRAM;  Surgeon: Elam Dutch, MD;  Location: [facility] CATH LAB;  Service: Cardiovascular;  Laterality: Left;   CHOLECYSTECTOMY  12/2016   ENDARTERECTOMY Right 10/15/2014   Procedure: ENDARTERECTOMY RIGHT SUBCLAVIAN ARTERY;  Surgeon: Elam Dutch, MD;  Location: [place];  Service: Vascular;  Laterality: Right;   ENDARTERECTOMY Right 10/15/2014   Procedure: ENDARTERECTOMY RIGHT COMMON CAROTID ARTERY;  Surgeon: Elam Dutch, MD;  Location: [place];  Service: Vascular;   Laterality: Right;   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   HERNIA REPAIR     LEFT HEART CATH AND CORONARY ANGIOGRAPHY N/A 03/17/2021   Procedure: LEFT HEART CATH AND CORONARY ANGIOGRAPHY;  Surgeon: Dionisio David, MD;  Location: [place] CV LAB;  Service: Cardiovascular;  Laterality: N/A;   LEFT HEART CATHETERIZATION WITH CORONARY ANGIOGRAM N/A 09/12/2014   Procedure: LEFT HEART CATHETERIZATION WITH CORONARY ANGIOGRAM;  Surgeon: Josue Hector, MD;  Location: [facility] C[facility] LAB;  Service: Cardiovascular;  Laterality: N/A;   LUMBAR SPINE SURGERY  12/18/2013   L2     DR POOL   NECK SURGERY     PATCH ANGIOPLASTY Right 10/15/2014   Procedure: PATCH ANGIOPLASTY OF RIGHT SUBCLAVIAN ARTERY , RIGHT COMMON CAROTID ARTERY & INNOMINATE ARTERY;  Surgeon: Elam Dutch, MD;  Location: [place];  Service: Vascular;  Laterality: Right;   STERNOTOMY N/A 10/15/2014   Procedure: PARTIAL STERNOTOMY & PLATING OF STERNUM;  Surgeon: Elam Dutch, MD;  Location: [place];  Service: Vascular;  Laterality: N/A;   TEE WITHOUT CARDIOVERSION N/A 12/25/2013   Procedure: TRANSESOPHAGEAL ECHOCARDIOGRAM (TEE);  Surgeon: Larey Dresser, MD;  Location: [place];  Service: Cardiovascular;  Laterality: N/A;    Home Medications:  Allergies as of 11/02/2022  Reactions   Bee Venom Swelling        Medication List        Accurate as of November 01, 2022 10:43 AM. If you have any questions, ask your nurse or doctor.          amLODipine 5 MG tablet Commonly known as: NORVASC Take 5 mg by mouth daily.   aspirin EC 81 MG tablet Take 81 mg by mouth daily.   atorvastatin 80 MG tablet Commonly known as: LIPITOR Take 80 mg by mouth daily.   Breztri Aerosphere 160-9-4.8 MCG/ACT Aero Generic drug: Budeson-Glycopyrrol-Formoterol Inhale 2 puffs into the lungs in the morning and at bedtime.   ciprofloxacin 500 MG tablet Commonly known as: CIPRO Take 1 tablet (500 mg total) by mouth 2 (two) times  daily.   clopidogrel 75 MG tablet Commonly known as: PLAVIX Take 75 mg by mouth daily.   Dupixent 300 MG/2ML Sopn Generic drug: Dupilumab Inject 300 mg into the skin every 14 (fourteen) days.   EPINEPHrine 0.3 mg/0.3 mL Soaj injection Commonly known as: EPI-PEN Inject 0.3 mg into the muscle as needed for anaphylaxis.   famotidine 40 MG tablet Commonly known as: PEPCID Take by mouth.   FLUoxetine 20 MG capsule Commonly known as: PROZAC Take 20 mg by mouth daily.   isosorbide mononitrate 30 MG 24 hr tablet Commonly known as: IMDUR Take 30 mg by mouth daily.   levocetirizine 5 MG tablet Commonly known as: XYZAL Take 5 mg by mouth at bedtime.   linaclotide 290 MCG Caps capsule Commonly known as: LINZESS Take 290 mcg by mouth daily before breakfast.   lisinopril-hydrochlorothiazide 20-25 MG tablet Commonly known as: ZESTORETIC Take 1 tablet by mouth daily.   metFORMIN 500 MG tablet Commonly known as: GLUCOPHAGE Take 500 mg by mouth daily.   montelukast 10 MG tablet Commonly known as: SINGULAIR Take 10 mg by mouth daily.   Multi-Vitamins Tabs Take 1 tablet by mouth daily.   naloxone 4 MG/0.1ML Liqd nasal spray kit Commonly known as: NARCAN Place 0.4 mg into the nose once.   nitroGLYCERIN 0.4 MG SL tablet Commonly known as: NITROSTAT Place 0.4 mg under the tongue every 5 (five) minutes as needed for chest pain.   ondansetron 4 MG tablet Commonly known as: ZOFRAN Take by mouth.   oxyCODONE 15 MG immediate release tablet Commonly known as: ROXICODONE Take by mouth.   pantoprazole 40 MG tablet Commonly known as: PROTONIX Take 1 tablet by mouth daily.   polyethylene glycol 17 g packet Commonly known as: MIRALAX / GLYCOLAX Take 17 g by mouth daily as needed (for constipation).   pregabalin 150 MG capsule Commonly known as: LYRICA Take 150 mg by mouth daily.   ProAir HFA 108 (90 Base) MCG/ACT inhaler Generic drug: albuterol INHALE 2 PUFFS INTO THE  LUNGS EVERY 6 (SIX) HOURS AS NEEDED FOR WHEEZING. What changed: See the new instructions.   albuterol (2.5 MG/3ML) 0.083% nebulizer solution Commonly known as: PROVENTIL USE 1 AMPULE IN NEBULIZER EVERY 6 HOURS AS NEEDED. What changed: Another medication with the same name was changed. Make sure you understand how and when to take each.   rOPINIRole 1 MG tablet Commonly known as: REQUIP Take 1 tablet by mouth at bedtime.   spironolactone 25 MG tablet Commonly known as: ALDACTONE Take 25 mg by mouth daily.   tamsulosin 0.4 MG Caps capsule Commonly known as: FLOMAX TAKE (1) CAPSULE BY MOUTH ONCE DAILY.        Allergies:  Allergies  Allergen Reactions   Bee Venom Swelling    Family History: Family History  Problem Relation Age of Onset   Heart disease Father    Heart attack Father    Diabetes Paternal Grandmother        1 st degree relatives   Hypertension Paternal Grandmother    Diabetes Brother    Heart disease Brother    Muscular dystrophy Brother    Muscular dystrophy Sister    Prostate cancer Neg Hx    Kidney cancer Neg Hx    Bladder Cancer Neg Hx     Social History:  reports that he quit smoking about 23 months ago. His smoking use included cigarettes. He has a 96.00 pack-year smoking history. He has never used smokeless tobacco. He reports that he does not drink alcohol and does not use drugs.  For pertinent review of systems please refer to history of present illness  Physical Exam: There were no vitals taken for this visit.  Constitutional:  Well nourished. Alert and oriented, No acute distress. HEENT: [place] AT, moist mucus membranes.  Trachea midline Cardiovascular: No clubbing, cyanosis, or edema. Respiratory: Normal respiratory effort, no increased work of breathing. GU: No CVA tenderness.  No bladder fullness or masses.  Patient with circumcised/uncircumcised phallus. ***Foreskin easily retracted***  Urethral meatus is patent.  No penile discharge. No  penile lesions or rashes. Scrotum without lesions, cysts, rashes and/or edema.  Testicles are located scrotally bilaterally. No masses are appreciated in the testicles. Left and right epididymis are normal. Rectal: Patient with  normal sphincter tone. Anus and perineum without scarring or rashes. No rectal masses are appreciated. Prostate is approximately *** grams, *** nodules are appreciated. Seminal vesicles are normal. Neurologic: Grossly intact, no focal deficits, moving all 4 extremities. Psychiatric: Normal mood and affect.   Laboratory Data: See HPI and EPIC I have reviewed the labs.   Pertinent Imaging: Anatomical Region Laterality Modality  Abdomen -- Computed Tomography  Pelvis -- --   Impression  Nonspecific anterior bladder which can be seen with chronic bladder outlet obstruction and/or infection. Clinical correlation is recommended.  3.1 cm infrarenal abdominal aortic aneurysm.  Right basilar dependent airspace disease which could present atelectasis, aspiration or pneumonia.  Additional findings, as described above Narrative  EXAM: CT ABDOMEN PELVIS W CONTRAST ACCESSION: [id number] UN CLINICAL INDICATION: 66 years old with left flank pain, concern for infected L renal stone ; Pyelonephritis suspected, history of renal stones or obstruction    COMPARISON: None  TECHNIQUE: A helical CT scan of the abdomen and pelvis was obtained following IV contrast from the lung bases through the pubic symphysis. Images were reconstructed in the axial plane. Coronal and sagittal reformatted images were also provided for further evaluation.   FINDINGS:  LOWER CHEST: Right lower lobe dependent airspace disease.  LIVER: Normal liver contour.  No focal liver lesions.  BILIARY: The gallbladder is surgically absent. Mild central biliary dilation.  SPLEEN: Normal in size and contour.  PANCREAS: Normal pancreatic contour.  No focal lesions.  No ductal dilation.  ADRENAL GLANDS:  Normal appearance of the adrenal glands.  KIDNEYS/URETERS: Symmetric renal enhancement.  No hydronephrosis.  No solid renal mass. 2.9 cm cyst at the upper pole of the right kidney. Subcentimeter low-attenuation lesion in the left kidney, too small to characterize.  BLADDER: Indentation of the posterior bladder related to median lobe hypertrophy of the prostate. Circumferential thickening of the bladder wall.  REPRODUCTIVE ORGANS: Median lobe hypertrophy.  GI TRACT: The stomach  appears unremarkable. There is a large duodenal diverticulum. The loops of small bowel are normal in caliber. Colonic diverticulosis. Note is made that the sigmoid colon is redundant and tortuous. The appendix appears unremarkable.  PERITONEUM, RETROPERITONEUM AND MESENTERY: No free air.  No ascites.  No fluid collection.  LYMPH NODES: No adenopathy.  VESSELS: Hepatic and portal veins are patent. Aneurysmal dilation of infrarenal aorta measuring up to 3.1 cm (5:58). Atherosclerotic calcifications of the aorta and its branches.  BONES and SOFT TISSUES: No aggressive osseous lesions. L1 and L3 fixation hardware. Chronic right L1 transverse process fracture. No focal soft tissue lesions. Nonspecific calcification in the soft tissues over the right hip. Procedure Note  Pietryga, Melba Coon, MD - 10/30/2022 Formatting of this note might be different from the original. EXAM: CT ABDOMEN PELVIS W CONTRAST ACCESSION: [id number] UN CLINICAL INDICATION: 66 years old with left flank pain, concern for infected L renal stone ; Pyelonephritis suspected, history of renal stones or obstruction    COMPARISON: None  TECHNIQUE: A helical CT scan of the abdomen and pelvis was obtained following IV contrast from the lung bases through the pubic symphysis. Images were reconstructed in the axial plane. Coronal and sagittal reformatted images were also provided for further evaluation.   FINDINGS:  LOWER CHEST: Right lower lobe dependent  airspace disease.  LIVER: Normal liver contour.  No focal liver lesions.  BILIARY: The gallbladder is surgically absent. Mild central biliary dilation.  SPLEEN: Normal in size and contour.  PANCREAS: Normal pancreatic contour.  No focal lesions.  No ductal dilation.  ADRENAL GLANDS: Normal appearance of the adrenal glands.  KIDNEYS/URETERS: Symmetric renal enhancement.  No hydronephrosis.  No solid renal mass. 2.9 cm cyst at the upper pole of the right kidney. Subcentimeter low-attenuation lesion in the left kidney, too small to characterize.  BLADDER: Indentation of the posterior bladder related to median lobe hypertrophy of the prostate. Circumferential thickening of the bladder wall.  REPRODUCTIVE ORGANS: Median lobe hypertrophy.  GI TRACT: The stomach appears unremarkable. There is a large duodenal diverticulum. The loops of small bowel are normal in caliber. Colonic diverticulosis. Note is made that the sigmoid colon is redundant and tortuous. The appendix appears unremarkable.  PERITONEUM, RETROPERITONEUM AND MESENTERY: No free air.  No ascites.  No fluid collection.  LYMPH NODES: No adenopathy.  VESSELS: Hepatic and portal veins are patent. Aneurysmal dilation of infrarenal aorta measuring up to 3.1 cm (5:58). Atherosclerotic calcifications of the aorta and its branches.  BONES and SOFT TISSUES: No aggressive osseous lesions. L1 and L3 fixation hardware. Chronic right L1 transverse process fracture. No focal soft tissue lesions. Nonspecific calcification in the soft tissues over the right hip.   IMPRESSION: Nonspecific anterior bladder which can be seen with chronic bladder outlet obstruction and/or infection. Clinical correlation is recommended.  3.1 cm infrarenal abdominal aortic aneurysm.  Right basilar dependent airspace disease which could present atelectasis, aspiration or pneumonia.  Additional findings, as described above Exam End: 10/30/22 21:47   Specimen  Collected: 10/30/22 21:49 Last Resulted: 10/30/22 22:13  Received From: [place]  Result Received: 11/01/22 08:35    Assessment & Plan:    1.  Pyelonephritis -UA *** -PVR *** -Reinforced the need to complete the entire antibiotic prescription  2. High risk hematuria - work up completed 09/2019 - no reports of gross heme - UA w/micro heme -will recheck when infection resolves   3. BPH with LUTS -PSA stable  -DRE deferred by patient -UA benign -symptoms -  nocturia  -continue conservative management, avoiding bladder irritants and timed voiding's -Continue tamsulosin 0.4 mg daily   No follow-ups on file.  These notes generated with voice recognition software. I apologize for typographical errors.  [place], [place] [address]  [place], [place] [address] [phone]

## 2022-11-02 ENCOUNTER — Ambulatory Visit: Payer: Medicare HMO | Admitting: Pulmonary Disease

## 2022-11-02 ENCOUNTER — Ambulatory Visit: Payer: Medicare HMO | Admitting: Urology

## 2022-11-02 DIAGNOSIS — N401 Enlarged prostate with lower urinary tract symptoms: Secondary | ICD-10-CM

## 2022-11-02 DIAGNOSIS — N12 Tubulo-interstitial nephritis, not specified as acute or chronic: Secondary | ICD-10-CM

## 2022-11-02 DIAGNOSIS — R3129 Other microscopic hematuria: Secondary | ICD-10-CM

## 2022-11-04 ENCOUNTER — Other Ambulatory Visit: Payer: Self-pay | Admitting: Urology

## 2022-11-04 ENCOUNTER — Other Ambulatory Visit (HOSPITAL_COMMUNITY): Payer: Self-pay

## 2022-11-04 DIAGNOSIS — N401 Enlarged prostate with lower urinary tract symptoms: Secondary | ICD-10-CM

## 2022-11-08 ENCOUNTER — Other Ambulatory Visit: Payer: Self-pay

## 2022-11-08 ENCOUNTER — Other Ambulatory Visit (HOSPITAL_COMMUNITY): Payer: Self-pay

## 2022-11-30 ENCOUNTER — Other Ambulatory Visit: Payer: Self-pay

## 2022-11-30 ENCOUNTER — Other Ambulatory Visit (HOSPITAL_COMMUNITY): Payer: Self-pay

## 2022-11-30 ENCOUNTER — Other Ambulatory Visit: Payer: Self-pay | Admitting: Pulmonary Disease

## 2022-11-30 DIAGNOSIS — J4489 Other specified chronic obstructive pulmonary disease: Secondary | ICD-10-CM

## 2022-11-30 MED ORDER — DUPIXENT 300 MG/2ML ~~LOC~~ SOAJ
300.0000 mg | SUBCUTANEOUS | 0 refills | Status: DC
  Filled 2022-11-30: qty 4, 28d supply, fill #0
  Filled 2023-01-07: qty 4, 28d supply, fill #1
  Filled 2023-01-27: qty 4, 28d supply, fill #2

## 2022-12-08 ENCOUNTER — Other Ambulatory Visit (HOSPITAL_COMMUNITY): Payer: Self-pay

## 2022-12-14 ENCOUNTER — Ambulatory Visit (INDEPENDENT_AMBULATORY_CARE_PROVIDER_SITE_OTHER): Payer: Medicare HMO | Admitting: Pulmonary Disease

## 2022-12-14 ENCOUNTER — Encounter: Payer: Self-pay | Admitting: Pulmonary Disease

## 2022-12-14 VITALS — BP 142/74 | HR 58 | Temp 97.8°F | Ht 71.0 in | Wt 225.6 lb

## 2022-12-14 DIAGNOSIS — J4489 Other specified chronic obstructive pulmonary disease: Secondary | ICD-10-CM | POA: Diagnosis not present

## 2022-12-14 DIAGNOSIS — I5189 Other ill-defined heart diseases: Secondary | ICD-10-CM | POA: Diagnosis not present

## 2022-12-14 DIAGNOSIS — J449 Chronic obstructive pulmonary disease, unspecified: Secondary | ICD-10-CM | POA: Diagnosis not present

## 2022-12-14 DIAGNOSIS — J9611 Chronic respiratory failure with hypoxia: Secondary | ICD-10-CM | POA: Diagnosis not present

## 2022-12-14 DIAGNOSIS — Z87891 Personal history of nicotine dependence: Secondary | ICD-10-CM

## 2022-12-14 NOTE — Progress Notes (Signed)
Subjective:    Patient ID: Bradley Sawyer, male    DOB: [birth year], 67 y.o.   MRN: [id number] Patient Care Team: Curly Rim, MD as PCP - General (Family Medicine) Tyler Pita, MD as Consulting Physician (Pulmonary Disease)  Chief Complaint  Patient presents with   Follow-up    Breathing is at baseline for him. He has occ wheezing. He is using his albuterol at least once per day.    [name] is a 67 year old former smoker who presents for follow-up on the issue of COPD with asthma.  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  Asthma is moderate persistent with seasonal variation.  Patient is today for follow-up.  Patient was last seen here on 03 August 2022.  Previously he had been set up with pulmonary rehab however he was not able to complete this due to chronic pain issues.  He is currently on Breztri 2 puffs twice a day and as needed albuterol and doing well with this medication.  He is on Dupixent for his severe persistent asthma component.  Usually he would require hospitalizations during spring and fall of the year however he has been doing well with no recent hospitalizations or exacerbations since he has been on this regimen.  He still feels that he is not completely compensated with regards to his breathing, we discussed that dyspnea has many components including pulmonary, cardiac and general physical conditioning.   Currently does not endorse any fevers, chills or sweats.  Cough is at baseline with usually morning component and scant clear to yellow sputum.  No hemoptysis.  No orthopnea or paroxysmal nocturnal dyspnea.  He is compliant with oxygen therapy.     Review of Systems A 10 point review of systems was performed and it is as noted above otherwise negative.  Patient Active Problem List   Diagnosis Date Noted   Chronic respiratory failure with hypoxia ([place]) 03/30/2021   COPD exacerbation ([place]) 02/27/2021   History of CVA (cerebrovascular accident) 02/27/2021   Chronic  pain, radicular 02/27/2021   Chronic, continuous use of opioids 02/27/2021   Unstable angina ([place]) 02/19/2021   Cigarette nicotine dependence without complication 43/32/9518   Acute lower UTI 02/19/2017   UTI (urinary tract infection) 02/19/2017   Left inguinal hernia 08/12/2016   Elevated troponin 05/08/2016   Anal fissure 03/15/2016   External hemorrhoid 03/15/2016   Acute viral pharyngitis 03/20/2015   Olecranon bursitis of left elbow 84/16/6063   Embolic stroke ([place]) 01/60/1093   Carotid stenosis 10/15/2014   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   PAD (peripheral artery disease) ([place]) 08/23/2014   Preventative health care 08/12/2014   Occlusion and stenosis of carotid artery without mention of cerebral infarction 07/25/2014   Subclavian steal syndrome 07/25/2014   CAD (coronary artery disease) 07/10/2014   Nocturia 04/17/2014   Testicular pain, right 04/17/2014   Urethral stricture 04/17/2014   CVA (cerebral infarction) 04/10/2014   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   amLODipine (NORVASC) 5 MG tablet Take 5 mg by mouth daily.   aspirin EC 81 MG tablet Take 81 mg by mouth daily.   atorvastatin (LIPITOR) 80 MG tablet Take 80 mg by mouth daily.   Budeson-Glycopyrrol-Formoterol (BREZTRI AEROSPHERE) 160-9-4.8 MCG/ACT AERO Inhale 2 puffs into the lungs in the morning and at bedtime.    clopidogrel (PLAVIX) 75 MG tablet Take 75 mg by mouth daily.   Dupilumab (DUPIXENT) 300 MG/2ML SOPN Inject 300 mg into the skin every 14 (fourteen) days.   EPINEPHrine 0.3 mg/0.3 mL IJ SOAJ injection Inject 0.3 mg into the muscle as needed for anaphylaxis.   famotidine (PEPCID) 40 MG tablet Take by mouth.   FLUoxetine (PROZAC) 20 MG capsule Take 20 mg by mouth daily.   isosorbide mononitrate (IMDUR) 30 MG 24 hr tablet Take 30 mg by mouth daily.   levocetirizine (XYZAL) 5 MG tablet Take 5 mg by mouth at bedtime.   linaclotide (LINZESS) 290 MCG CAPS capsule Take 290 mcg by mouth daily before breakfast.   lisinopril-hydrochlorothiazide (ZESTORETIC) 20-25 MG tablet Take 1 tablet by mouth daily.   metFORMIN (GLUCOPHAGE) 500 MG tablet Take 500 mg by mouth daily.   montelukast (SINGULAIR) 10 MG tablet Take 10 mg by mouth daily.   Multiple Vitamin (MULTI-VITAMINS) TABS Take 1 tablet by mouth daily.   naloxone (NARCAN) nasal spray 4 mg/0.1 mL Place 0.4 mg into the nose once.   nitroGLYCERIN (NITROSTAT) 0.4 MG SL tablet Place 0.4 mg under the tongue every 5 (five) minutes as needed for chest pain.   ondansetron (ZOFRAN) 4 MG tablet Take by mouth.   oxyCODONE (ROXICODONE) 15 MG immediate release tablet Take by mouth.   pantoprazole (PROTONIX) 40 MG tablet Take 1 tablet by mouth daily.   polyethylene glycol (MIRALAX / GLYCOLAX) packet Take 17 g by mouth daily as needed (for constipation).   pregabalin (LYRICA) 150 MG capsule Take 150 mg by mouth daily.   PROAIR HFA 108 (90 BASE) MCG/ACT inhaler INHALE 2 PUFFS INTO THE LUNGS EVERY 6 (SIX) HOURS AS NEEDED FOR WHEEZING. (Patient taking differently: Inhale 2 puffs into the lungs every 6 (six) hours as needed for shortness of breath or wheezing.)   rOPINIRole (REQUIP) 1 MG tablet Take 1 tablet by mouth at bedtime.   spironolactone (ALDACTONE) 25 MG tablet Take 25 mg by mouth daily.   tamsulosin (FLOMAX) 0.4 MG CAPS capsule TAKE ONE CAPSULE BY MOUTH EVERY DAY    Immunization History  Administered Date(s) Administered   DTaP 12/04/2018   Fluad Quad(high Dose 65+) 10/08/2021   Influenza, High Dose Seasonal PF 10/08/2021   Influenza, Seasonal, Injecte, Preservative Fre 08/01/2013, 08/12/2014, 08/26/2015, 08/06/2016, 09/26/2019   Influenza,inj,Quad PF,6+ Mos 08/01/2013, 08/12/2014   Influenza-Unspecified 10/30/2018, 09/26/2019, 09/10/2020   Moderna Sars-Covid-2 Vaccination 02/06/2020, 03/05/2020, 12/12/2020   Pneumococcal Polysaccharide-23 10/20/2014   Pneumococcal-Unspecified 10/20/2014       Objective:   Physical Exam BP (!) 142/74 (BP Location: Left Arm, Cuff Size: Normal)   Pulse (!) 58   Temp 97.8 F (36.6 C) (Oral)   Ht '5\' 11"'$  (1.803 m)   Wt 225 lb 9.6 oz (102.3 kg)   SpO2 93% Comment: on RA  BMI 31.46 kg/m   SpO2: 93 % (on RA) O2 Device: None (Room air) patient had oxygen off.  GENERAL: Well-developed, overweight gentleman, no acute distress.  HEAD: Normocephalic, atraumatic. EYES: Pupils equal, round, reactive to light.  No scleral icterus. MOUTH: Edentulous, oral mucosa  moist, no thrush. NECK: Supple. No thyromegaly. Trachea midline. No JVD.  No adenopathy. PULMONARY: Good air entry bilaterally.  No adventitious sounds! CARDIOVASCULAR: S1 and S2. Regular rate and rhythm.  No rubs, murmurs or gallops heard. ABDOMEN: Benign. MUSCULOSKELETAL: No joint deformity, no clubbing, no edema. NEUROLOGIC: No focal deficits, no gait disturbance.  Speech is fluent. SKIN: Intact,warm,dry. PSYCH: Mood and behavior normal.     Assessment & Plan:     ICD-10-CM   1. COPD with asthma  J44.89 Pulmonary Function Test [facility] Only   Overall well compensated Continue Breztri 2 puffs twice a day Continue as needed albuterol Continue Dupixent for asthma component    2. Stage 3 severe COPD by GOLD classification ([place])  J44.9    Will reevaluate with PFTs Continue current medication regimen as above    3. Grade II diastolic dysfunction   Q98.26    This issue adds complexity to his management Continue cardiology follow-up    4. Chronic respiratory failure with hypoxia (H[facility])  J96.11    On supplemental oxygen at 2 L/min Continues same    5. Former heavy cigarette smoker (20-39 per day)  Z87.891    No evidence of relapse     Orders Placed This Encounter  Procedures   Pulmonary Function Test [facility] Only    Standing Status:   Future    Standing Expiration Date:   12/15/2023    Scheduling Instructions:     3-4 months    Order Specific Question:   Full PFT: includes the following: basic spirometry, spirometry pre & post bronchodilator, diffusion capacity (DLCO), lung volumes    Answer:   Full PFT   Patient appears to be fairly well compensated on his current regimen.  We will reevaluate the status of his COPD/asthma.  We will see the patient in follow-up in 3 to 4 months time he is to contact us prior to that time should any new difficulties arise.   Renold Don, MD Advanced Bronchoscopy PCCM [facility]    *This note was dictated using voice recognition software/Dragon.  Despite best efforts to proofread, errors can occur which can change the meaning. Any transcriptional errors that result from this process are unintentional and may not be fully corrected at the time of dictation.

## 2022-12-14 NOTE — Patient Instructions (Signed)
We are going to order new breathing tests to reassess your lungs.  We will see you in follow-up in 3 to 4 months time call sooner should any new problems arise.

## 2023-01-01 ENCOUNTER — Encounter: Payer: Self-pay | Admitting: Pulmonary Disease

## 2023-01-04 ENCOUNTER — Other Ambulatory Visit (HOSPITAL_COMMUNITY): Payer: Self-pay

## 2023-01-07 ENCOUNTER — Other Ambulatory Visit: Payer: Self-pay

## 2023-01-07 ENCOUNTER — Other Ambulatory Visit (HOSPITAL_COMMUNITY): Payer: Self-pay

## 2023-01-11 ENCOUNTER — Other Ambulatory Visit (HOSPITAL_COMMUNITY): Payer: Self-pay

## 2023-01-19 ENCOUNTER — Other Ambulatory Visit: Payer: Self-pay | Admitting: Cardiovascular Disease

## 2023-01-19 DIAGNOSIS — K2100 Gastro-esophageal reflux disease with esophagitis, without bleeding: Secondary | ICD-10-CM

## 2023-01-19 DIAGNOSIS — I10 Essential (primary) hypertension: Secondary | ICD-10-CM

## 2023-01-19 DIAGNOSIS — I2510 Atherosclerotic heart disease of native coronary artery without angina pectoris: Secondary | ICD-10-CM

## 2023-01-27 ENCOUNTER — Other Ambulatory Visit (HOSPITAL_COMMUNITY): Payer: Self-pay

## 2023-02-02 ENCOUNTER — Other Ambulatory Visit (HOSPITAL_COMMUNITY): Payer: Self-pay

## 2023-02-09 ENCOUNTER — Other Ambulatory Visit: Payer: Self-pay | Admitting: Cardiovascular Disease

## 2023-02-25 ENCOUNTER — Other Ambulatory Visit: Payer: Self-pay | Admitting: Pulmonary Disease

## 2023-02-25 NOTE — Telephone Encounter (Signed)
Okay to order albuterol MDI for rescue.

## 2023-03-01 ENCOUNTER — Other Ambulatory Visit: Payer: Self-pay | Admitting: Pulmonary Disease

## 2023-03-01 ENCOUNTER — Other Ambulatory Visit: Payer: Self-pay

## 2023-03-01 ENCOUNTER — Other Ambulatory Visit (HOSPITAL_COMMUNITY): Payer: Self-pay

## 2023-03-01 DIAGNOSIS — J4489 Other specified chronic obstructive pulmonary disease: Secondary | ICD-10-CM

## 2023-03-01 MED ORDER — DUPIXENT 300 MG/2ML ~~LOC~~ SOAJ
300.0000 mg | SUBCUTANEOUS | 0 refills | Status: DC
  Filled 2023-03-01: qty 4, 28d supply, fill #0
  Filled 2023-03-28: qty 4, 28d supply, fill #1
  Filled 2023-04-27 (×2): qty 4, 28d supply, fill #2

## 2023-03-04 ENCOUNTER — Other Ambulatory Visit (HOSPITAL_COMMUNITY): Payer: Self-pay

## 2023-03-07 ENCOUNTER — Other Ambulatory Visit: Payer: Self-pay | Admitting: Urology

## 2023-03-07 DIAGNOSIS — N401 Enlarged prostate with lower urinary tract symptoms: Secondary | ICD-10-CM

## 2023-03-24 ENCOUNTER — Other Ambulatory Visit (HOSPITAL_COMMUNITY): Payer: Self-pay

## 2023-03-28 ENCOUNTER — Other Ambulatory Visit (HOSPITAL_COMMUNITY): Payer: Self-pay

## 2023-03-30 ENCOUNTER — Other Ambulatory Visit: Payer: Self-pay

## 2023-04-14 ENCOUNTER — Other Ambulatory Visit: Payer: Self-pay | Admitting: Cardiovascular Disease

## 2023-04-14 DIAGNOSIS — I2510 Atherosclerotic heart disease of native coronary artery without angina pectoris: Secondary | ICD-10-CM

## 2023-04-14 DIAGNOSIS — I10 Essential (primary) hypertension: Secondary | ICD-10-CM

## 2023-04-14 DIAGNOSIS — K2100 Gastro-esophageal reflux disease with esophagitis, without bleeding: Secondary | ICD-10-CM

## 2023-04-26 ENCOUNTER — Other Ambulatory Visit (HOSPITAL_COMMUNITY): Payer: Self-pay

## 2023-04-27 ENCOUNTER — Other Ambulatory Visit (HOSPITAL_COMMUNITY): Payer: Self-pay

## 2023-04-29 ENCOUNTER — Other Ambulatory Visit (HOSPITAL_COMMUNITY): Payer: Self-pay

## 2023-05-06 ENCOUNTER — Other Ambulatory Visit: Payer: Self-pay | Admitting: Cardiovascular Disease

## 2023-05-06 ENCOUNTER — Telehealth: Payer: Self-pay | Admitting: Cardiovascular Disease

## 2023-05-06 NOTE — Telephone Encounter (Signed)
*  STAT* If patient is at the pharmacy, call can be transferred to refill team.   1. Which medications need to be refilled? (please list name of each medication and dose if known) Nitroglycerin  2. Which pharmacy/location (including street and city if local pharmacy) is medication to be sent to? [facility] - [place], [place] - [address]    3. Do they need a 30 day or 90 day supply?   Standard emergency supply  Patient's daughter states the patient is completely out of medication.

## 2023-05-17 ENCOUNTER — Telehealth: Payer: Self-pay | Admitting: Pulmonary Disease

## 2023-05-17 MED ORDER — METHYLPREDNISOLONE 4 MG PO TBPK: ORAL_TABLET | ORAL | 0 refills | Status: DC

## 2023-05-17 MED ORDER — AZITHROMYCIN 250 MG PO TABS: ORAL_TABLET | ORAL | 0 refills | Status: DC

## 2023-05-17 NOTE — Telephone Encounter (Signed)
I have sent in the prescriptions and notified the patient.  Nothing further needed. 

## 2023-05-17 NOTE — Telephone Encounter (Signed)
Called and spoke to patient. He reports of increased SOB and prod cough with tan sputum x1wk. He feels that the heat is aggravating his breathing. Denied f/c/s or additional sx.  He is taking Dupixent bi weekly. Last dose was last Wednesday. He felt that sx improved for 2-3d after taking Dupixent but have since worsened.  He is using albuterol HFA BID and Breztri BID. He has not been doing nebulizer treatments.  No recent covid test.  Dr. Jayme Cloud, please advise. Thanks

## 2023-05-17 NOTE — Telephone Encounter (Signed)
Pt states the shot only helped for a day or 2. States the heat is aggravating his breathing.

## 2023-05-17 NOTE — Telephone Encounter (Signed)
And lets call in a Medrol Dosepak Azithromycin Z-Pak.  If he worsens he will need to go to either urgent care or ED.

## 2023-05-18 ENCOUNTER — Other Ambulatory Visit: Payer: Self-pay

## 2023-05-18 ENCOUNTER — Other Ambulatory Visit: Payer: Self-pay | Admitting: Pulmonary Disease

## 2023-05-18 ENCOUNTER — Other Ambulatory Visit (HOSPITAL_COMMUNITY): Payer: Self-pay

## 2023-05-18 DIAGNOSIS — J4489 Other specified chronic obstructive pulmonary disease: Secondary | ICD-10-CM

## 2023-05-18 MED ORDER — DUPIXENT 300 MG/2ML ~~LOC~~ SOAJ
300.0000 mg | SUBCUTANEOUS | 0 refills | Status: DC
  Filled 2023-05-18: qty 4, 28d supply, fill #0
  Filled 2023-06-14: qty 4, 28d supply, fill #1
  Filled 2023-07-15: qty 4, 28d supply, fill #2

## 2023-05-19 ENCOUNTER — Other Ambulatory Visit: Payer: Self-pay | Admitting: Cardiovascular Disease

## 2023-05-19 DIAGNOSIS — I10 Essential (primary) hypertension: Secondary | ICD-10-CM

## 2023-05-19 DIAGNOSIS — I2510 Atherosclerotic heart disease of native coronary artery without angina pectoris: Secondary | ICD-10-CM

## 2023-05-19 DIAGNOSIS — K2100 Gastro-esophageal reflux disease with esophagitis, without bleeding: Secondary | ICD-10-CM

## 2023-05-20 ENCOUNTER — Other Ambulatory Visit (HOSPITAL_COMMUNITY): Payer: Self-pay

## 2023-06-11 ENCOUNTER — Other Ambulatory Visit: Payer: Self-pay | Admitting: Pulmonary Disease

## 2023-06-14 ENCOUNTER — Other Ambulatory Visit (HOSPITAL_COMMUNITY): Payer: Self-pay

## 2023-06-16 ENCOUNTER — Ambulatory Visit: Payer: Medicare HMO

## 2023-06-21 ENCOUNTER — Ambulatory Visit (INDEPENDENT_AMBULATORY_CARE_PROVIDER_SITE_OTHER): Payer: Medicare HMO | Admitting: Pulmonary Disease

## 2023-06-21 ENCOUNTER — Encounter: Payer: Self-pay | Admitting: Pulmonary Disease

## 2023-06-21 VITALS — BP 110/60 | HR 83 | Temp 98.1°F | Ht 71.0 in | Wt 230.0 lb

## 2023-06-21 DIAGNOSIS — I5189 Other ill-defined heart diseases: Secondary | ICD-10-CM | POA: Diagnosis not present

## 2023-06-21 DIAGNOSIS — J4489 Other specified chronic obstructive pulmonary disease: Secondary | ICD-10-CM | POA: Diagnosis not present

## 2023-06-21 DIAGNOSIS — Z87891 Personal history of nicotine dependence: Secondary | ICD-10-CM

## 2023-06-21 DIAGNOSIS — K219 Gastro-esophageal reflux disease without esophagitis: Secondary | ICD-10-CM | POA: Diagnosis not present

## 2023-06-21 DIAGNOSIS — J449 Chronic obstructive pulmonary disease, unspecified: Secondary | ICD-10-CM | POA: Diagnosis not present

## 2023-06-21 DIAGNOSIS — J9611 Chronic respiratory failure with hypoxia: Secondary | ICD-10-CM

## 2023-06-21 MED ORDER — LANSOPRAZOLE 30 MG PO CPDR: 30.0000 mg | DELAYED_RELEASE_CAPSULE | Freq: Every day | ORAL | 6 refills | Status: DC

## 2023-06-21 MED ORDER — METHYLPREDNISOLONE 4 MG PO TBPK: ORAL_TABLET | ORAL | 0 refills | Status: DC

## 2023-06-21 NOTE — Patient Instructions (Signed)
We have sent in a prescription for a different medication for your reflux.  We are getting a swallow study to see how severe your reflux is.  We have sent the referral to the gastroenterologist.  We sent in a prescription for a Medrol Dosepak to your pharmacy.  We will see you in follow-up in 2 to 3 months time call sooner should any new problems arise

## 2023-06-21 NOTE — Progress Notes (Signed)
Subjective:    Patient ID: Bradley Sawyer, male    DOB: Jul 09, 1956, 67 y.o.   MRN: [id number]  Patient Care Team: [name], MD as PCP - General (Family Medicine) Salena Saner, MD as Consulting Physician (Pulmonary Disease)  Chief Complaint  Patient presents with  . Follow-up    DOE. No wheezing or cough.     HPI    Review of Systems A 10 point review of systems was performed and it is as noted above otherwise negative.   Patient Active Problem List   Diagnosis Date Noted  . Chronic respiratory failure with hypoxia (HCC) 03/30/2021  . COPD exacerbation ([facility]) 02/27/2021  . History of CVA (cerebrovascular accident) 02/27/2021  . Chronic pain, radicular 02/27/2021  . Chronic, continuous use of opioids 02/27/2021  . Unstable angina (HCC) 02/19/2021  . Cigarette nicotine dependence without complication 05/24/2018  . Acute lower UTI 02/19/2017  . UTI (urinary tract infection) 02/19/2017  . Left inguinal hernia 08/12/2016  . Elevated troponin 05/08/2016  . Anal fissure 03/15/2016  . External hemorrhoid 03/15/2016  . Acute viral pharyngitis 03/20/2015  . Olecranon bursitis of left elbow 12/23/2014  . 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  . Carotid stenosis 10/15/2014  . 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 PAD (peripheral artery disease) (HCC) 08/23/2014  . Preventative health care 08/12/2014  . Occlusion and stenosis of carotid artery without mention of cerebral infarction 07/25/2014  . Subclavian steal syndrome 07/25/2014  . CAD (coronary artery disease) 07/10/2014  . Nocturia 04/17/2014  . Testicular pain, right 04/17/2014  . Urethral stricture 04/17/2014  . CVA (cerebral infarction) 04/10/2014  . 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 Hematuria 08/06/2013  . Compression fracture of L2, with back and right groin pain 07/18/2013  . History of colon polyps  05/18/2012  . COPD (chronic obstructive pulmonary disease) ([facility]) 12/24/2009  . Hyperlipemia 09/20/2006  . TOBACCO ABUSE 09/20/2006  . Essential hypertension 09/20/2006  . GERD 09/20/2006    Social History   Tobacco Use  . Smoking status: Former    Current packs/day: 0.00    Average packs/day: 2.0 packs/day for 48.0 years (96.0 ttl pk-yrs)    Types: Cigarettes    Start date: 11/22/1972    Quit date: 11/22/2020    Years since quitting: 2.5  . Smokeless tobacco: Never  Substance Use Topics  . Alcohol use: No    Alcohol/week: 0.0 standard drinks of alcohol    Allergies  Allergen Reactions  . Bee Venom Swelling    Current Meds  Medication Sig  . albuterol (PROVENTIL) (2.5 MG/3ML) 0.083% nebulizer solution USE 1 AMPULE IN NEBULIZER EVERY 6 HOURS AS NEEDED.  Marland Kitchen albuterol (VENTOLIN HFA) 108 (90 Base) MCG/ACT inhaler INHALE 2 PUFFS BY MOUTH EVERY 4-6 HOURS AS NEEDED FOR WHEEZING  . amLODipine (NORVASC) 5 MG tablet Take 5 mg by mouth daily.  Marland Kitchen aspirin EC 81 MG tablet Take 81 mg by mouth daily.  Marland Kitchen atorvastatin (LIPITOR) 80 MG tablet TAKE ONE TABLET BY MOUTH ONCE DAILY AT BEDTIME  . BREZTRI AEROSPHERE 160-9-4.8 MCG/ACT AERO INHALE TWO PUFFS into THE lungs TWICE DAILY morning & bedtime  . clopidogrel (PLAVIX) 75 MG tablet TAKE ONE TABLET BY MOUTH ONCE DAILY  . Dupilumab (DUPIXENT) 300 MG/2ML SOPN Inject 300 mg into the skin every 14 (fourteen) days.  Marland Kitchen EPINEPHrine 0.3 mg/0.3 mL IJ SOAJ injection Inject 0.3 mg into the muscle as needed for  anaphylaxis.  . famotidine (PEPCID) 40 MG tablet TAKE ONE TABLET BY MOUTH ONCE DAILY  . FLUoxetine (PROZAC) 20 MG capsule Take 20 mg by mouth daily.  . furosemide (LASIX) 20 MG tablet TAKE ONE TABLET BY MOUTH EVERY DAY  . isosorbide mononitrate (IMDUR) 30 MG 24 hr tablet Take 30 mg by mouth daily.  Marland Kitchen levocetirizine (XYZAL) 5 MG tablet Take 5 mg by mouth at bedtime.  Marland Kitchen linaclotide (LINZESS) 290 MCG CAPS capsule Take 290 mcg by mouth daily before breakfast.   . lisinopril-hydrochlorothiazide (ZESTORETIC) 20-25 MG tablet TAKE ONE TABLET BY MOUTH ONCE DAILY  . metFORMIN (GLUCOPHAGE) 500 MG tablet Take 500 mg by mouth daily.  . montelukast (SINGULAIR) 10 MG tablet Take 10 mg by mouth daily.  . Multiple Vitamin (MULTI-VITAMINS) TABS Take 1 tablet by mouth daily.  . naloxone (NARCAN) nasal spray 4 mg/0.1 mL Place 0.4 mg into the nose once.  . nitroGLYCERIN (NITROSTAT) 0.4 MG SL tablet DISSOLVE (1) TABLET UNDER TONGUE AS NEEDED TO RELIEVE CHEST PAIN. MAY REPEAT EVERY 5 MINUTES.  Marland Kitchen ondansetron (ZOFRAN) 4 MG tablet Take by mouth.  . oxyCODONE (ROXICODONE) 15 MG immediate release tablet Take by mouth.  . pantoprazole (PROTONIX) 40 MG tablet Take 1 tablet by mouth daily.  . polyethylene glycol (MIRALAX / GLYCOLAX) packet Take 17 g by mouth daily as needed (for constipation).  . pregabalin (LYRICA) 150 MG capsule Take 150 mg by mouth daily.  Marland Kitchen rOPINIRole (REQUIP) 1 MG tablet Take 1 tablet by mouth at bedtime.  Marland Kitchen spironolactone (ALDACTONE) 25 MG tablet TAKE ONE TABLET BY MOUTH ONCE DAILY  . tamsulosin (FLOMAX) 0.4 MG CAPS capsule TAKE ONE CAPSULE BY MOUTH EVERY DAY    Immunization History  Administered Date(s) Administered  . DTaP 12/04/2018  . Fluad Quad(high Dose 65+) 10/08/2021  . Influenza, High Dose Seasonal PF 10/08/2021  . Influenza, Seasonal, Injecte, Preservative Fre 08/01/2013, 08/12/2014, 08/26/2015, 08/06/2016, 09/26/2019  . Influenza,inj,Quad PF,6+ Mos 08/01/2013, 08/12/2014  . Influenza-Unspecified 10/30/2018, 09/26/2019, 09/10/2020  . Moderna Sars-Covid-2 Vaccination 02/06/2020, 03/05/2020, 12/12/2020  . PNEUMOCOCCAL CONJUGATE-20 01/27/2022  . Pneumococcal Polysaccharide-23 10/20/2014  . Pneumococcal-Unspecified 10/20/2014        Objective:     BP 110/60 (BP Location: Left Arm, Cuff Size: Normal)   Pulse 83   Temp 98.1 F (36.7 C)   Ht 5\' 11"  (1.803 m)   Wt 230 lb (104.3 kg) Comment: per patient. in a wheelchair today  SpO2  97%   BMI 32.08 kg/m   SpO2: 97 % O2 Device: Nasal cannula O2 Flow Rate (L/min): 3 L/min O2 Type: Continuous O2  GENERAL: HEAD: Normocephalic, atraumatic.  EYES: Pupils equal, round, reactive to light.  No scleral icterus.  MOUTH:  NECK: Supple. No thyromegaly. Trachea midline. No JVD.  No adenopathy. PULMONARY: Good air entry bilaterally.  No adventitious sounds. CARDIOVASCULAR: S1 and S2. Regular rate and rhythm.  ABDOMEN: MUSCULOSKELETAL: No joint deformity, no clubbing, no edema.  NEUROLOGIC:  SKIN: Intact,warm,dry. PSYCH:        Assessment & Plan:   No diagnosis found.  No orders of the defined types were placed in this encounter.   No orders of the defined types were placed in this encounter.     Gailen Shelter, MD Advanced Bronchoscopy PCCM [facility]    *This note was dictated using voice recognition software/Dragon.  Despite best efforts to proofread, errors can occur which can change the meaning. Any transcriptional errors that result from this process are unintentional and may  not be fully corrected at the time of dictation.

## 2023-06-22 ENCOUNTER — Encounter: Payer: Self-pay | Admitting: Pulmonary Disease

## 2023-06-29 ENCOUNTER — Ambulatory Visit
Admission: RE | Admit: 2023-06-29 | Discharge: 2023-06-29 | Disposition: A | Discharge status: Home / Self Care | Payer: Medicare HMO | Source: Ambulatory Visit | Attending: Pulmonary Disease | Admitting: Pulmonary Disease

## 2023-06-29 DIAGNOSIS — K219 Gastro-esophageal reflux disease without esophagitis: Secondary | ICD-10-CM | POA: Insufficient documentation

## 2023-07-05 NOTE — Progress Notes (Deleted)
CARDIOLOGY CONSULT NOTE       Patient ID: Bradley Sawyer MRN: [id number] DOB/AGE: [id number] y.o.  Admit date: (Not on file) Referring Physician: Corrington Primary Physician: [name], MD Primary Cardiologist: New Reason for Consultation: Chest pain  Active Problems:   * No active hospital problems. *   HPI:  67 y.o. referred by primary for chest pain. Last seen by me in 2017. History of smoking, HTN, HLD, COPD. Cardiac CTA non obstructive dx in 2013.  However calcium score was elevated at 419.  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 Eventually had partial sternotomy with endarterectomy of innominate , carotid and subclavian for steal.  Cath in 2015 with no obstructive dx. He tends to be bradycardic and AV nodal drugs and beta blockers have been avoided    ROS All other systems reviewed and negative except as noted above  Past Medical History:  Diagnosis Date  . Anxiety   . Arthritis   . 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 Cocaine dependence    Last use 1998, attends 3 NA meetings weekly.  . Compression fracture    L2  . COPD (chronic obstructive pulmonary disease) (HCC)    bronchitis  . CVA (cerebral infarction)   . Disorder of vocal cord   . Fatty liver   . GERD (gastroesophageal reflux disease)   . Hx of cardiovascular stress test    ETT/Lexiscan-Myoview (10/15):  Inferior, inferoapical, apical lateral defect c/w mild ischemia, EF 66%; Intermediate Risk  . Hyperlipidemia   . Hypertension   . Right shoulder pain    2/2 partial rotator cuff tear, tendinopathy, mild subacromial/subdeltioid bursitis, A/C joint arthropathy per MRI done in 3/07  . Tobacco abuse     Family History  Problem Relation Age of Onset  . Heart disease Father   . Heart attack Father   . Diabetes Paternal Grandmother        1 st degree relatives  . Hypertension Paternal Grandmother   . Diabetes Brother   . Heart disease Brother   .  Muscular dystrophy Brother   . Muscular dystrophy Sister   . Prostate cancer Neg Hx   . Kidney cancer Neg Hx   . Bladder Cancer Neg Hx     Social History   Socioeconomic History  . Marital status: Divorced    Spouse name: Not on file  . Number of children: 1  . Years of education: GED  . Highest education level: Not on file  Occupational History  . Occupation: Disabled  Tobacco Use  . Smoking status: Former    Current packs/day: 0.00    Average packs/day: 2.0 packs/day for 48.0 years (96.0 ttl pk-yrs)    Types: Cigarettes    Start date: 11/22/1972    Quit date: 11/22/2020    Years since quitting: 2.6  . Smokeless tobacco: Never  Vaping Use  . Vaping status: Never Used  Substance and Sexual Activity  . Alcohol use: No    Alcohol/week: 0.0 standard drinks of alcohol  . Drug use: No    Types: Cocaine, Marijuana    Comment: no recent use  . Sexual activity: Not on file  Other Topics Concern  . Not on file  Social History Narrative   Patient is single with one child.   Patient is right handed.   Patient has his GED.   Patient drinks 5 or more cups daily.   Social Determinants of Corporate investment banker  Strain: Low Risk  (01/26/2023)   Received from [facility], [facility]   Overall Financial Resource Strain (CARDIA)   . Difficulty of Paying Living Expenses: Not hard at all  Food Insecurity: No Food Insecurity (01/26/2023)   Received from [facility], [facility]   Hunger Vital Sign   . Worried About Programme researcher, broadcasting/film/video in the Last Year: Never true   . Ran Out of Food in the Last Year: Never true  Transportation Needs: No Transportation Needs (01/26/2023)   Received from [facility], [facility]   [facility] - Transportation   . Lack of Transportation (Medical): No   . Lack of Transportation (Non-Medical): No  Physical Activity: Unknown (10/27/2022)   Received from [facility], [facility]   Exercise Vital Sign   . Days of Exercise per Week: 0 days   .  Minutes of Exercise per Session: Not on file  Stress: No Stress Concern Present (10/27/2022)   Received from [facility], [facility] - Occupational Stress Questionnaire   . Feeling of Stress : Only a little  Social Connections: Unknown (05/10/2023)   Received from [place], [facility]   Social Connections   . How often do you feel lonely or isolated from those around you? (Adult - for ages 89 years and over): Not on file  Intimate Partner Violence: Not At Risk (10/27/2022)   Received from [facility], [facility]   HITS   . Over the last 12 months how often did your partner physically hurt you?: 1   . Over the last 12 months how often did your partner insult you or talk down to you?: 1   . Over the last 12 months how often did your partner threaten you with physical harm?: 1   . Over the last 12 months how often did your partner scream or curse at you?: 1    Past Surgical History:  Procedure Laterality Date  . ANTERIOR LAT LUMBAR FUSION N/A 12/18/2013   Procedure: Lumbar Two Anteriorlateral Corpectomy w/ cage, interbody fusion, anterior plating, Lumbar One to Lumbar Three percutaneous pedicle screws;  Surgeon: Temple Pacini, MD;  Location: [facility] NEURO ORS;  Service: Neurosurgery;  Laterality: N/A;  . AORTA -INNOMIATE BYPASS N/A 10/15/2014   Procedure: ENDARTERCTOMY OF  INNOMINATE ARTERY ;  Surgeon: Sherren Kerns, MD;  Location: [facility] OR;  Service: Vascular;  Laterality: N/A;  . ARCH AORTOGRAM N/A 08/23/2014   Procedure: ARCH AORTOGRAM;  Surgeon: Sherren Kerns, MD;  Location: [facility] CATH LAB;  Service: Cardiovascular;  Laterality: N/A;  . arch aortogram, left carotid,left subclavian angiogram  08/23/2014  . CAROTID ANGIOGRAM Left 08/23/2014   Procedure: CAROTID ANGIOGRAM;  Surgeon: Sherren Kerns, MD;  Location: [facility] CATH LAB;  Service: Cardiovascular;  Laterality: Left;  . CHOLECYSTECTOMY  12/2016  . ENDARTERECTOMY Right 10/15/2014    Procedure: ENDARTERECTOMY RIGHT SUBCLAVIAN ARTERY;  Surgeon: Sherren Kerns, MD;  Location: [facility] OR;  Service: Vascular;  Laterality: Right;  . ENDARTERECTOMY Right 10/15/2014   Procedure: ENDARTERECTOMY RIGHT COMMON CAROTID ARTERY;  Surgeon: Sherren Kerns, MD;  Location: [facility] OR;  Service: Vascular;  Laterality: Right;  . 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 HERNIA REPAIR    . LEFT HEART CATH AND CORONARY ANGIOGRAPHY N/A 03/17/2021   Procedure: LEFT HEART CATH AND CORONARY ANGIOGRAPHY;  Surgeon: Laurier Nancy, MD;  Location: [facility] INVASIVE CV LAB;  Service: Cardiovascular;  Laterality: N/A;  . LEFT  HEART CATHETERIZATION WITH CORONARY ANGIOGRAM N/A 09/12/2014   Procedure: LEFT HEART CATHETERIZATION WITH CORONARY ANGIOGRAM;  Surgeon: Wendall Stade, MD;  Location: [facility] CATH LAB;  Service: Cardiovascular;  Laterality: N/A;  . LUMBAR SPINE SURGERY  12/18/2013   L2     DR POOL  . NECK SURGERY    . PATCH ANGIOPLASTY Right 10/15/2014   Procedure: PATCH ANGIOPLASTY OF RIGHT SUBCLAVIAN ARTERY , RIGHT COMMON CAROTID ARTERY & INNOMINATE ARTERY;  Surgeon: Sherren Kerns, MD;  Location: [facility] OR;  Service: Vascular;  Laterality: Right;  . STERNOTOMY N/A 10/15/2014   Procedure: PARTIAL STERNOTOMY & PLATING OF STERNUM;  Surgeon: Sherren Kerns, MD;  Location: [facility] OR;  Service: Vascular;  Laterality: N/A;  . TEE WITHOUT CARDIOVERSION N/A 12/25/2013   Procedure: TRANSESOPHAGEAL ECHOCARDIOGRAM (TEE);  Surgeon: Laurey Morale, MD;  Location: [facility] ENDOSCOPY;  Service: Cardiovascular;  Laterality: N/A;      Current Outpatient Medications:  .  albuterol (PROVENTIL) (2.5 MG/3ML) 0.083% nebulizer solution, USE 1 AMPULE IN NEBULIZER EVERY 6 HOURS AS NEEDED., Disp: 75 mL, Rfl: 0 .  albuterol (VENTOLIN HFA) 108 (90 Base) MCG/ACT inhaler, INHALE 2 PUFFS BY MOUTH EVERY 4-6 HOURS AS NEEDED FOR WHEEZING, Disp: 8.5 g, Rfl: 11 .  amLODipine (NORVASC) 5 MG tablet, Take 5 mg by mouth daily., Disp: , Rfl:  .   aspirin EC 81 MG tablet, Take 81 mg by mouth daily., Disp: , Rfl:  .  atorvastatin (LIPITOR) 80 MG tablet, TAKE ONE TABLET BY MOUTH ONCE DAILY AT BEDTIME, Disp: 30 tablet, Rfl: 2 .  azithromycin (ZITHROMAX) 250 MG tablet, Take 2 tablets (500 mg) on  Day 1,  followed by 1 tablet (250 mg) once daily on Days 2 through 5. (Patient not taking: Reported on 06/21/2023), Disp: 6 each, Rfl: 0 .  BREZTRI AEROSPHERE 160-9-4.8 MCG/ACT AERO, INHALE TWO PUFFS into THE lungs TWICE DAILY morning & bedtime, Disp: 10.7 g, Rfl: 11 .  clopidogrel (PLAVIX) 75 MG tablet, TAKE ONE TABLET BY MOUTH ONCE DAILY, Disp: 30 tablet, Rfl: 2 .  Dupilumab (DUPIXENT) 300 MG/2ML SOPN, Inject 300 mg into the skin every 14 (fourteen) days., Disp: 12 mL, Rfl: 0 .  EPINEPHrine 0.3 mg/0.3 mL IJ SOAJ injection, Inject 0.3 mg into the muscle as needed for anaphylaxis., Disp: , Rfl:  .  famotidine (PEPCID) 40 MG tablet, TAKE ONE TABLET BY MOUTH ONCE DAILY, Disp: 30 tablet, Rfl: 2 .  FLUoxetine (PROZAC) 20 MG capsule, Take 20 mg by mouth daily., Disp: , Rfl:  .  furosemide (LASIX) 20 MG tablet, TAKE ONE TABLET BY MOUTH EVERY DAY, Disp: 30 tablet, Rfl: 2 .  isosorbide mononitrate (IMDUR) 30 MG 24 hr tablet, Take 30 mg by mouth daily., Disp: , Rfl:  .  lansoprazole (PREVACID) 30 MG capsule, Take 1 capsule (30 mg total) by mouth daily at 12 noon., Disp: 30 capsule, Rfl: 6 .  levocetirizine (XYZAL) 5 MG tablet, Take 5 mg by mouth at bedtime., Disp: , Rfl:  .  linaclotide (LINZESS) 290 MCG CAPS capsule, Take 290 mcg by mouth daily before breakfast., Disp: , Rfl:  .  lisinopril-hydrochlorothiazide (ZESTORETIC) 20-25 MG tablet, TAKE ONE TABLET BY MOUTH ONCE DAILY, Disp: 30 tablet, Rfl: 2 .  metFORMIN (GLUCOPHAGE) 500 MG tablet, Take 500 mg by mouth daily., Disp: , Rfl:  .  methylPREDNISolone (MEDROL DOSEPAK) 4 MG TBPK tablet, Take as directed in the package., Disp: 21 tablet, Rfl: 0 .  montelukast (SINGULAIR) 10 MG tablet, Take 10 mg  by mouth daily.,  Disp: , Rfl:  .  Multiple Vitamin (MULTI-VITAMINS) TABS, Take 1 tablet by mouth daily., Disp: , Rfl:  .  naloxone (NARCAN) nasal spray 4 mg/0.1 mL, Place 0.4 mg into the nose once., Disp: , Rfl:  .  nitroGLYCERIN (NITROSTAT) 0.4 MG SL tablet, DISSOLVE (1) TABLET UNDER TONGUE AS NEEDED TO RELIEVE CHEST PAIN. MAY REPEAT EVERY 5 MINUTES., Disp: 25 tablet, Rfl: 0 .  ondansetron (ZOFRAN) 4 MG tablet, Take by mouth., Disp: , Rfl:  .  oxyCODONE (ROXICODONE) 15 MG immediate release tablet, Take by mouth., Disp: , Rfl:  .  polyethylene glycol (MIRALAX / GLYCOLAX) packet, Take 17 g by mouth daily as needed (for constipation)., Disp: , Rfl:  .  pregabalin (LYRICA) 150 MG capsule, Take 150 mg by mouth daily., Disp: , Rfl:  .  rOPINIRole (REQUIP) 1 MG tablet, Take 1 tablet by mouth at bedtime., Disp: , Rfl:  .  spironolactone (ALDACTONE) 25 MG tablet, TAKE ONE TABLET BY MOUTH ONCE DAILY, Disp: 30 tablet, Rfl: 2 .  tamsulosin (FLOMAX) 0.4 MG CAPS capsule, TAKE ONE CAPSULE BY MOUTH EVERY DAY, Disp: 30 capsule, Rfl: 3 No current facility-administered medications for this visit.  Facility-Administered Medications Ordered in Other Visits:  .  sodium chloride flush (NS) 0.9 % injection 3 mL, 3 mL, Intravenous, Q12H, Laurier Nancy, MD    Physical Exam: There were no vitals taken for this visit. *** HELP TEXT ***  This SmartLink requires parameters. Parameters are variables that are added to the [facility] name to request specific information. The parameter for .curwt is the number of encounters to display readings from.  For example: .curwt[4  In this example, the SmartLink displays readings from the last four encounters.    {physical UEAV:[id number]}  Labs:   Lab Results  Component Value Date   WBC 10.6 (H) 09/22/2021   HGB 12.5 (L) 09/22/2021   HCT 35.5 (L) 09/22/2021   MCV 90.1 09/22/2021   PLT 250 09/22/2021   No results for input(s): "NA", "K", "CL", "CO2", "BUN", "CREATININE", "CALCIUM",  "PROT", "BILITOT", "ALKPHOS", "ALT", "AST", "GLUCOSE" in the last 168 hours.  Invalid input(s): "LABALBU" Lab Results  Component Value Date   TROPONINI 0.13 (H) 05/08/2016    Lab Results  Component Value Date   CHOL 179 02/11/2015   CHOL 121 12/22/2013   CHOL 239 (H) 03/21/2012   Lab Results  Component Value Date   HDL 36 (L) 02/11/2015   HDL 41 12/22/2013   HDL 33 (L) 03/21/2012   Lab Results  Component Value Date   LDLCALC NOT CALC 02/11/2015   LDLCALC 52 12/22/2013   LDLCALC  03/21/2012     Comment:       Not calculated due to Triglyceride >400. Suggest ordering Direct LDL (Unit Code: 14782).   Total Cholesterol/HDL Ratio:CHD Risk                        Coronary Heart Disease Risk Table                                        Men       Women          1/2 Average Risk              3.4        3.3  Average Risk              5.0        4.4           2X Average Risk              9.6        7.1           3X Average Risk             23.4       11.0 Use the calculated Patient Ratio above and the CHD Risk table  to determine the patient's CHD Risk. ATP III Classification (LDL):       < 100        mg/dL         Optimal      630 - 129     mg/dL         Near or Above Optimal      130 - 159     mg/dL         Borderline High      160 - 189     mg/dL         High       > 160        mg/dL         Very High     Lab Results  Component Value Date   TRIG 402 (H) 02/11/2015   TRIG 142 12/22/2013   TRIG 622 (H) 03/21/2012   Lab Results  Component Value Date   CHOLHDL 5.0 02/11/2015   CHOLHDL 3.0 12/22/2013   CHOLHDL 7.2 03/21/2012   Lab Results  Component Value Date   LDLDIRECT 94 02/12/2015   LDLDIRECT 109 (H) 03/16/2012      Radiology: DG ESOPHAGUS W DOUBLE CM (HD)  Result Date: 06/29/2023 CLINICAL DATA:  Patient with reported history of GERD, dysphagia, and COPD. Patient reports globus sensation and gagging when swallowing. Ordering notes mentioned concern  for possible stricture EXAM: ESOPHAGUS/BARIUM SWALLOW/TABLET STUDY TECHNIQUE: Combined double and single contrast examination was performed using effervescent crystals, high-density barium, and thin liquid barium. This exam was performed by Mina Marble, PA-C , and was supervised and interpreted by Dr Elige Ko. FLUOROSCOPY: Radiation Exposure Index (as provided by the fluoroscopic device): 24.10 mGy Kerma COMPARISON:  None Available. FINDINGS: Swallowing: Appears normal. No vestibular penetration or aspiration seen. Pharynx: Prominent cricopharyngeus Esophagus: Normal appearance. Esophageal motility: Moderate dysmotility visualized with retrograde flow of contrast, splitting of contrast bolus, and tertiary contractions visualized Hiatal Hernia: None. Gastroesophageal reflux: Large volume gastroesophageal reflux to the level of the upper thoracic esophagus visualized Ingested 13mm barium tablet: Proximally 2 minute delay in passage of barium tablet through distal esophagus without evidence of a stricture likely related to dysmotility. Other: None. IMPRESSION: 1.  Large volume gastroesophageal reflux visualized 2. Tertiary contractions of the esophagus as can be seen with esophageal spasm. Electronically Signed   By: Elige Ko M.D.   On: 06/29/2023 12:15    EKG: ***   ASSESSMENT AND PLAN:    Signed: Charlton Haws 07/05/2023, 2:40 PM

## 2023-07-14 ENCOUNTER — Ambulatory Visit: Payer: Medicare HMO | Admitting: Cardiovascular Disease

## 2023-07-15 ENCOUNTER — Other Ambulatory Visit: Payer: Self-pay

## 2023-07-15 ENCOUNTER — Other Ambulatory Visit (HOSPITAL_COMMUNITY): Payer: Self-pay

## 2023-08-04 ENCOUNTER — Other Ambulatory Visit (HOSPITAL_COMMUNITY): Payer: Self-pay

## 2023-08-08 ENCOUNTER — Other Ambulatory Visit (HOSPITAL_COMMUNITY): Payer: Self-pay

## 2023-08-08 ENCOUNTER — Other Ambulatory Visit: Payer: Self-pay | Admitting: Urology

## 2023-08-08 ENCOUNTER — Other Ambulatory Visit: Payer: Self-pay | Admitting: Pulmonary Disease

## 2023-08-08 ENCOUNTER — Other Ambulatory Visit: Payer: Self-pay

## 2023-08-08 DIAGNOSIS — N401 Enlarged prostate with lower urinary tract symptoms: Secondary | ICD-10-CM

## 2023-08-08 DIAGNOSIS — J4489 Other specified chronic obstructive pulmonary disease: Secondary | ICD-10-CM

## 2023-08-08 MED ORDER — DUPIXENT 300 MG/2ML ~~LOC~~ SOAJ
300.0000 mg | SUBCUTANEOUS | 0 refills | Status: DC
  Filled 2023-08-08: qty 4, 28d supply, fill #0
  Filled 2023-09-05: qty 4, 28d supply, fill #1
  Filled 2023-10-04: qty 4, 28d supply, fill #2

## 2023-08-10 ENCOUNTER — Telehealth: Payer: Self-pay

## 2023-08-10 NOTE — Telephone Encounter (Signed)
Per Rema Jasmine, current Prior Authorization is expiring.  Submitted a Prior Authorization request to [facility] for DUPIXENT via CoverMyMeds. Per automated response, pt currently has approval on file that is active from 11/22/2022 to 11/21/2023. Rema Jasmine has been updated with this new information. Nothing further needed at this time.   Key: [id number]

## 2023-08-16 ENCOUNTER — Ambulatory Visit (INDEPENDENT_AMBULATORY_CARE_PROVIDER_SITE_OTHER): Payer: Medicare HMO | Admitting: Gastroenterology

## 2023-08-16 ENCOUNTER — Telehealth: Payer: Self-pay

## 2023-08-16 ENCOUNTER — Encounter: Payer: Self-pay | Admitting: Gastroenterology

## 2023-08-16 VITALS — BP 108/69 | HR 90 | Temp 97.8°F | Ht 70.25 in | Wt 215.0 lb

## 2023-08-16 DIAGNOSIS — R0602 Shortness of breath: Secondary | ICD-10-CM | POA: Diagnosis not present

## 2023-08-16 DIAGNOSIS — Z86010 Personal history of colonic polyps: Secondary | ICD-10-CM | POA: Diagnosis not present

## 2023-08-16 DIAGNOSIS — K219 Gastro-esophageal reflux disease without esophagitis: Secondary | ICD-10-CM

## 2023-08-16 DIAGNOSIS — K449 Diaphragmatic hernia without obstruction or gangrene: Secondary | ICD-10-CM

## 2023-08-16 MED ORDER — NA SULFATE-K SULFATE-MG SULF 17.5-3.13-1.6 GM/177ML PO SOLN: 1.0000 | Freq: Once | ORAL | 0 refills | Status: AC

## 2023-08-16 MED ORDER — PANTOPRAZOLE SODIUM 40 MG PO TBEC: 40.0000 mg | DELAYED_RELEASE_TABLET | Freq: Two times a day (BID) | ORAL | 2 refills | Status: DC

## 2023-08-16 NOTE — Telephone Encounter (Signed)
Procedure clearance faxed to Dr End, pt has upcoming OV with Cardiology

## 2023-08-16 NOTE — Patient Instructions (Signed)
Please call me, Shawna Orleans, at [phone] to let me know if you need to move the procedure. This is my direct line, so you can leave a detailed message.

## 2023-08-16 NOTE — Telephone Encounter (Signed)
Procedure clearance sent to Pulm and Blood thinner clearance faxed to PCP

## 2023-08-16 NOTE — Progress Notes (Signed)
Gastroenterology Consultation  Referring Provider:     [name], MD Primary Care Physician:  [name], MD Primary Gastroenterologist:  Dr. Servando Snare     Reason for Consultation:     Shortness of breath        HPI:   Bradley Sawyer is a 67 y.o. y/o male referred for consultation & management of shortness of breath by Dr. Salvadore Farber, Kip A, MD. This patient comes in today with a nasal cannula and continuous oxygen.  Despite a long history of tobacco use and quitting a year ago the patient was told that his lung disease may be due to reflux.  The patient does report that he sometimes has a sour taste in his mouth and sometimes feels warm liquid come up in his mouth.  The patient has been treated with Prevacid 30 mg a day and denies any heartburn.  He states that his worsening of symptoms has been suggested to be from the reflux. It appears that the patient had a colonoscopy by Dr. Marina Goodell in [place] back in 2015 and had multiple adenomatous polyps at that time.  He has not had a repeat colonoscopy nor does he recall being told that he needed a repeat colonoscopy.  The patient denies any unexplained weight loss fevers chills nausea vomiting black stools or bloody stools.  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   Compression fracture    L2   COPD (chronic obstructive pulmonary disease) (HCC)    bronchitis   CVA (cerebral infarction)    Disorder of vocal cord    Fatty liver    GERD (gastroesophageal reflux disease)    Hx of cardiovascular stress test    ETT/Lexiscan-Myoview (10/15):  Inferior, inferoapical, apical lateral defect c/w mild ischemia, EF 66%; Intermediate Risk   Hyperlipidemia    Hypertension    Right shoulder pain    2/2 partial rotator cuff tear, tendinopathy, mild subacromial/subdeltioid bursitis, A/C  joint arthropathy per MRI done in 3/07   Tobacco abuse     Past Surgical History:  Procedure Laterality Date   ANTERIOR LAT LUMBAR FUSION N/A 12/18/2013   Procedure: Lumbar Two Anteriorlateral Corpectomy w/ cage, interbody fusion, anterior plating, Lumbar One to Lumbar Three percutaneous pedicle screws;  Surgeon: Temple Pacini, MD;  Location: [facility]UR[facility] ORS;  Service: Neurosurgery;  Laterality: N/A;   AORTA -INNOMIATE BYPASS N/A 10/15/2014   Procedure: ENDARTERCTOMY OF  INNOMINATE ARTERY ;  Surgeon: Sherren Kerns, MD;  Location: [facility] OR;  Service: Vascular;  Laterality: N/A;   ARCH AORTOGRAM N/A 08/23/2014   Procedure: ARCH AORTOGRAM;  Surgeon: Sherren Kerns, MD;  Location: [facility] CATH LAB;  Service: Cardiovascular;  Laterality: N/A;   arch aortogram, left carotid,left subclavian angiogram  08/23/2014   CAROTID ANGIOGRAM Left 08/23/2014   Procedure: CAROTID ANGIOGRAM;  Surgeon: Sherren Kerns, MD;  Location: [facility] CATH LAB;  Service: Cardiovascular;  Laterality: Left;   CHOLECYSTECTOMY  12/2016   ENDARTERECTOMY Right 10/15/2014   Procedure: ENDARTERECTOMY RIGHT SUBCLAVIAN ARTERY;  Surgeon: Sherren Kerns, MD;  Location: [facility] OR;  Service: Vascular;  Laterality: Right;   ENDARTERECTOMY Right 10/15/2014   Procedure: ENDARTERECTOMY RIGHT COMMON CAROTID ARTERY;  Surgeon: Sherren Kerns, MD;  Location: [facility] OR;  Service: Vascular;  Laterality: Right;   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   HERNIA REPAIR     LEFT HEART CATH AND CORONARY ANGIOGRAPHY N/A 03/17/2021   Procedure: LEFT HEART CATH AND CORONARY ANGIOGRAPHY;  Surgeon: Laurier Nancy, MD;  Location: [facility] INVASIVE CV LAB;  Service: Cardiovascular;  Laterality: N/A;   LEFT HEART CATHETERIZATION WITH CORONARY ANGIOGRAM N/A 09/12/2014   Procedure: LEFT HEART CATHETERIZATION WITH CORONARY ANGIOGRAM;  Surgeon: Wendall Stade, MD;  Location: [facility] CATH LAB;  Service: Cardiovascular;  Laterality: N/A;   LUMBAR SPINE SURGERY  12/18/2013   L2      DR POOL   NECK SURGERY     PATCH ANGIOPLASTY Right 10/15/2014   Procedure: PATCH ANGIOPLASTY OF RIGHT SUBCLAVIAN ARTERY , RIGHT COMMON CAROTID ARTERY & INNOMINATE ARTERY;  Surgeon: Sherren Kerns, MD;  Location: [facility] OR;  Service: Vascular;  Laterality: Right;   STERNOTOMY N/A 10/15/2014   Procedure: PARTIAL STERNOTOMY & PLATING OF STERNUM;  Surgeon: Sherren Kerns, MD;  Location: [facility] OR;  Service: Vascular;  Laterality: N/A;   TEE WITHOUT CARDIOVERSION N/A 12/25/2013   Procedure: TRANSESOPHAGEAL ECHOCARDIOGRAM (TEE);  Surgeon: Laurey Morale, MD;  Location: [facility] ENDOSCOPY;  Service: Cardiovascular;  Laterality: N/A;    Prior to Admission medications   Medication Sig Start Date End Date Taking? Authorizing Provider  albuterol (PROVENTIL) (2.5 MG/3ML) 0.083% nebulizer solution USE 1 AMPULE IN NEBULIZER EVERY 6 HOURS AS NEEDED. 06/29/21  Yes Salena Saner, MD  albuterol (VENTOLIN HFA) 108 (90 Base) MCG/ACT inhaler INHALE 2 PUFFS BY MOUTH EVERY 4-6 HOURS AS NEEDED FOR WHEEZING 02/25/23  Yes Salena Saner, MD  amLODipine (NORVASC) 5 MG tablet Take 5 mg by mouth daily.   Yes [provider]  aspirin EC 81 MG tablet Take 81 mg by mouth daily.   Yes [provider]  atorvastatin (LIPITOR) 80 MG tablet TAKE ONE TABLET BY MOUTH ONCE DAILY AT BEDTIME 05/19/23  Yes Laurier Nancy, MD  azithromycin (ZITHROMAX) 250 MG tablet Take 2 tablets (500 mg) on  Day 1,  followed by 1 tablet (250 mg) once daily on Days 2 through 5. Patient taking differently: Take 2 tablets (500 mg) on  Day 1,  followed by 1 tablet (250 mg) once daily on Days 2 through 5. 05/17/23  Yes Salena Saner, MD  BREZTRI AEROSPHERE 160-9-4.8 MCG/ACT AERO INHALE TWO PUFFS into THE lungs TWICE DAILY morning & bedtime 06/13/23  Yes Salena Saner, MD  clopidogrel (PLAVIX) 75 MG tablet TAKE ONE TABLET BY MOUTH ONCE DAILY 05/19/23  Yes Laurier Nancy, MD  Dupilumab (DUPIXENT) 300 MG/2ML SOPN Inject 300 mg into the  skin every 14 (fourteen) days. 08/08/23  Yes Salena Saner, MD  EPINEPHrine 0.3 mg/0.3 mL IJ SOAJ injection Inject 0.3 mg into the muscle as needed for anaphylaxis. 04/04/20  Yes [provider]  famotidine (PEPCID) 40 MG tablet TAKE ONE TABLET BY MOUTH ONCE DAILY 05/19/23  Yes Adrian Blackwater A, MD  FLUoxetine (PROZAC) 20 MG capsule Take 20 mg by mouth daily. 06/01/21  Yes [provider]  furosemide (LASIX) 20 MG tablet TAKE ONE TABLET BY MOUTH EVERY DAY 05/19/23  Yes Adrian Blackwater A, MD  isosorbide mononitrate (IMDUR) 30 MG 24 hr tablet Take 30 mg by mouth daily.   Yes [provider]  lansoprazole (PREVACID) 30 MG capsule Take 1 capsule (30 mg total) by mouth daily at 12 noon. 06/21/23  Yes Salena Saner, MD  levocetirizine (XYZAL) 5 MG tablet Take 5 mg  by mouth at bedtime. 07/15/20  Yes [provider]  linaclotide Karlene Einstein) 290 MCG CAPS capsule Take 290 mcg by mouth daily before breakfast. 12/05/19  Yes [provider]  lisinopril-hydrochlorothiazide (ZESTORETIC) 20-25 MG tablet TAKE ONE TABLET BY MOUTH ONCE DAILY 05/19/23  Yes Laurier Nancy, MD  metFORMIN (GLUCOPHAGE) 500 MG tablet Take 500 mg by mouth daily. 07/22/20  Yes [provider]  methylPREDNISolone (MEDROL DOSEPAK) 4 MG TBPK tablet Take as directed in the package. 06/21/23  Yes Salena Saner, MD  montelukast (SINGULAIR) 10 MG tablet Take 10 mg by mouth daily. 07/15/20  Yes [provider]  Multiple Vitamin (MULTI-VITAMINS) TABS Take 1 tablet by mouth daily.   Yes [provider]  naloxone (NARCAN) nasal spray 4 mg/0.1 mL Place 0.4 mg into the nose once. 09/02/16  Yes [provider]  nitroGLYCERIN (NITROSTAT) 0.4 MG SL tablet DISSOLVE (1) TABLET UNDER TONGUE AS NEEDED TO RELIEVE CHEST PAIN. MAY REPEAT EVERY 5 MINUTES. 05/06/23  Yes Laurier Nancy, MD  ondansetron (ZOFRAN) 4 MG tablet Take by mouth. 02/04/22  Yes [provider]  oxyCODONE  (ROXICODONE) 15 MG immediate release tablet Take by mouth. 10/09/21  Yes [provider]  polyethylene glycol (MIRALAX / GLYCOLAX) packet Take 17 g by mouth daily as needed (for constipation).   Yes [provider]  pregabalin (LYRICA) 150 MG capsule Take 150 mg by mouth daily. 09/20/19  Yes [provider]  rOPINIRole (REQUIP) 1 MG tablet Take 1 tablet by mouth at bedtime. 06/01/21  Yes [provider]  spironolactone (ALDACTONE) 25 MG tablet TAKE ONE TABLET BY MOUTH ONCE DAILY 05/19/23  Yes Laurier Nancy, MD  tamsulosin (FLOMAX) 0.4 MG CAPS capsule TAKE ONE CAPSULE BY MOUTH EVERY DAY 03/07/23  Yes McGowan, Wellington Hampshire, PA-C    Family History  Problem Relation Age of Onset   Heart disease Father    Heart attack Father    Diabetes Paternal Grandmother        1 st degree relatives   Hypertension Paternal Grandmother    Diabetes Brother    Heart disease Brother    Muscular dystrophy Brother    Muscular dystrophy Sister    Prostate cancer Neg Hx    Kidney cancer Neg Hx    Bladder Cancer Neg Hx      Social History   Tobacco Use   Smoking status: Former    Current packs/day: 0.00    Average packs/day: 2.0 packs/day for 48.0 years (96.0 ttl pk-yrs)    Types: Cigarettes    Start date: 11/22/1972    Quit date: 11/22/2020    Years since quitting: 2.7   Smokeless tobacco: Never  Vaping Use   Vaping status: Never Used  Substance Use Topics   Alcohol use: No    Alcohol/week: 0.0 standard drinks of alcohol   Drug use: No    Types: Cocaine, Marijuana    Comment: no recent use    Allergies as of 08/16/2023 - Review Complete 08/16/2023  Allergen Reaction Noted   Bee venom Swelling 04/04/2020    Review of Systems:    All systems reviewed and negative except where noted in HPI.   Physical Exam:  BP 108/69 (BP Location: Left Arm, Patient Position: Sitting, Cuff Size: Large)   Pulse 90   Temp 97.8 F (36.6 C) (Oral)   Ht 5' 10.25" (1.784 m)   Wt 215  lb (97.5 kg)   BMI 30.63 kg/m  No LMP for  male patient. General:   Alert,  Well-developed, well-nourished, pleasant and cooperative in NAD Head:  Normocephalic and atraumatic. Eyes:  Sclera clear, no icterus.   Conjunctiva pink. Ears:  Normal auditory acuity. Rectal:  Deferred.  Pulses:  Normal pulses noted. Neurologic:  Alert and oriented x3;  grossly normal neurologically. Lymph Nodes:  No significant cervical adenopathy. Psych:  Alert and cooperative. Normal mood and affect.  Imaging Studies: No results found.  Assessment and Plan:   ASAIAH GUMAN is a 67 y.o. y/o male who comes in today with a report of worsening shortness of breath and need for home oxygen.  The patient states that his pulmonologist believes that it may be due to reflux.  The patient is on 30 mg of Prevacid and will be switched to pantoprazole 40 mg a day.  The patient will also be set up for an EGD and colonoscopy due to his history of polyps and imaging that showed a large hiatal hernia.  The patient has been told that he may need the hiatal hernia repaired.  The patient has been explained the plan and agrees with it.    [name], MD. Clementeen Graham    Note: This dictation was prepared with Dragon dictation along with smaller phrase technology. Any transcriptional errors that result from this process are unintentional.

## 2023-08-16 NOTE — Telephone Encounter (Signed)
Pre-operative Risk Assessment    Patient Name: Bradley Sawyer  DOB: Aug 13, 1956 MRN: [id number]      Request for Surgical Clearance    Procedure:   EGD AND COLONOSCOPY  Date of Surgery:  Clearance 08/30/23                                 Surgeon:  Brent General INDICTED  Surgeon's Group or Practice Name:  [facility] GASTRO[facility]OLOG[facility]  Phone number:  [phone] Fax number:  [phone] ATTN: MELANIE   Type of Clearance Requested:   - Pharmacy:  Hold Aspirin and Clopidogrel (Plavix) NEEDS INSTRUCTIONS WHEN TO HOLD   Type of Anesthesia:  General    Additional requests/questions:    SignedMichaelle Copas   08/16/2023, 5:09 PM

## 2023-08-17 ENCOUNTER — Telehealth: Payer: Self-pay

## 2023-08-17 NOTE — Telephone Encounter (Signed)
Received a surgical clearance from [facility]. Patient is scheduled for a colonoscopy and EGD under general anesthesia on 08/30/2023. Patient was last see on 06/21/2023. Will the patient need a follow up appointment in order to obtain clearance?

## 2023-08-17 NOTE — Telephone Encounter (Signed)
Name: Bradley Sawyer  DOB: 12-Dec-1955  MRN: [id number]  Primary Cardiologist: None   Chart reviewed as part of pre-operative protocol coverage. Because of Bradley Sawyer's past medical history and time since last visit, he will require a follow-up in-office visit in order to better assess preoperative cardiovascular risk. Patient has not been seen by our practice since 2017. He is pending establishment of care on 08/26/23 with Dr. Okey Dupre. Therefore, we cannot provide pre-operative recommendations until he re-establishes care.  Since patient will be seeing MD, will defer to Dr. Okey Dupre to provide recommendations on holding aspirin and Plavix at time of visit on 08/26/23.  Anticipate colonoscopy/EGD will need to be moved back as 10/8 will not give adequate time for MD to see patient and provide recommendation for holding antiplatelets on 10/4.  I will route to Dr. Okey Dupre so he is aware to address at OV, added preop modifier to appt notes. Will route this bundled recommendation to requesting provider via Epic fax function. Please call with questions.  Laurann Montana, PA-C  08/17/2023, 8:12 AM

## 2023-08-18 NOTE — Telephone Encounter (Signed)
Yes

## 2023-08-18 NOTE — Telephone Encounter (Signed)
Appt scheduled for 10/1 at 3:00pm.  Nothing further needed.

## 2023-08-23 ENCOUNTER — Encounter: Payer: Self-pay | Admitting: Pulmonary Disease

## 2023-08-23 ENCOUNTER — Ambulatory Visit (INDEPENDENT_AMBULATORY_CARE_PROVIDER_SITE_OTHER): Payer: Medicare HMO | Admitting: Pulmonary Disease

## 2023-08-23 VITALS — BP 108/60 | HR 79 | Temp 97.6°F | Ht 70.25 in | Wt 213.0 lb

## 2023-08-23 DIAGNOSIS — K219 Gastro-esophageal reflux disease without esophagitis: Secondary | ICD-10-CM

## 2023-08-23 DIAGNOSIS — J4489 Other specified chronic obstructive pulmonary disease: Secondary | ICD-10-CM

## 2023-08-23 DIAGNOSIS — I5189 Other ill-defined heart diseases: Secondary | ICD-10-CM

## 2023-08-23 DIAGNOSIS — J9611 Chronic respiratory failure with hypoxia: Secondary | ICD-10-CM

## 2023-08-23 DIAGNOSIS — Z01811 Encounter for preprocedural respiratory examination: Secondary | ICD-10-CM

## 2023-08-23 DIAGNOSIS — J449 Chronic obstructive pulmonary disease, unspecified: Secondary | ICD-10-CM | POA: Diagnosis not present

## 2023-08-23 NOTE — Patient Instructions (Addendum)
We discussed that your risk for general anesthesia for your upcoming procedure is moderate to severe.  This risk cannot be further reduced.  This means that you may have a chance to be admitted after your procedure if your breathing is not doing well after the anesthesia.  However, our anesthesia team is excellent and they will try to prevent those issues as much as possible.  Our team [facility] will be available if needed.  I suspect a lot of your breathing issues are due to reflux that was demonstrated on the recent swallow test you had.  Continue taking your Breztri and as needed albuterol.  Continue your Dupixent shot.  Continue your Protonix for your reflux.  You have a follow-up scheduled with me on 28 October please keep that appointment.

## 2023-08-23 NOTE — Progress Notes (Unsigned)
Subjective:    Patient ID: Bradley Sawyer, male    DOB: 1955/12/21, 67 y.o.   MRN: [id number]  Patient Care Team: [name], MD as PCP - General (Family Medicine) Salena Saner, MD as Consulting Physician (Pulmonary Disease)  Chief Complaint  Patient presents with   Follow-up    Increased DOE. No Wheezing. Cough with thick white sputum.    HPI 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  Asthma is moderate persistent with seasonal variation.  Patient also has chronic respiratory failure with hypoxia requiring 3 L/min of oxygen.  Patient presents today for follow-up, last seen on 21 June 2023.  At that time he was complaining of significant esophageal reflux symptoms to the point of affecting his breathing.  He was started on lansoprazole (switched to pantoprazole for insurance purposes) and referred to gastroenterology for evaluation.  He also required a Medrol Dosepak due to a minor exacerbation at that time.  Previously he had been set up with pulmonary rehab however he was not able to complete this due to chronic pain issues.  He is currently on Breztri 2 puffs twice a day and as needed albuterol and doing well with this medication.  He is on Dupixent for his severe persistent asthma component, he is compliant.  Does note that towards the end of the 2-week interval he starts noticing increasing shortness of breath.  Previously he would require hospitalizations during spring and fall of the year however he has been doing well with no recent hospitalizations or exacerbations since he has been on this regimen.  He still feels that he is not completely compensated with regards to his breathing, we discussed that dyspnea has many components including pulmonary, cardiac and general physical conditioning.  He underwent barium swallow on 29 June 2023 that showed large volume  gastroesophageal reflux to the level of the upper thoracic esophagus.  He also had tertiary contractions of the esophagus consistent with esophageal spasm.  I suspect he is chronically aspirating due to this.  He continues to be symptomatic and is going to require EGD and colonoscopy which will require general anesthesia to complete.  We are asked to render risk assessment with regards to his pulmonary status.     Currently does not endorse any fevers, chills or sweats.  Cough is at baseline with usually morning component and scant clear to yellow sputum.  No hemoptysis.  No orthopnea or paroxysmal nocturnal dyspnea.  He is compliant with oxygen therapy.  Remains abstinent of cigarettes.  Since his July visit he has not had any exacerbations nor admissions to the hospital.   Review of Systems A 10 point review of systems was performed and it is as noted above otherwise negative.   Patient Active Problem List   Diagnosis Date Noted   Chronic respiratory failure with hypoxia (HCC) 03/30/2021   COPD exacerbation (HCC) 02/27/2021   History of CVA (cerebrovascular accident) 02/27/2021   Chronic pain, radicular 02/27/2021   Chronic, continuous use of opioids 02/27/2021   Unstable angina (HCC) 02/19/2021   Cigarette nicotine dependence without complication 05/24/2018   Acute lower UTI 02/19/2017   UTI (urinary tract infection) 02/19/2017   Left inguinal hernia 08/12/2016   Elevated troponin 05/08/2016   Anal fissure 03/15/2016   External hemorrhoid 03/15/2016   Acute viral pharyngitis 03/20/2015   Olecranon bursitis of left elbow 12/23/2014  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   Carotid stenosis 10/15/2014   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   PAD (peripheral artery disease) (HCC) 08/23/2014   Preventative health care 08/12/2014   Occlusion and stenosis of carotid artery without mention of cerebral infarction 07/25/2014    Subclavian steal syndrome 07/25/2014   CAD (coronary artery disease) 07/10/2014   Nocturia 04/17/2014   Testicular pain, right 04/17/2014   Urethral stricture 04/17/2014   Cerebral infarction (HCC) 04/10/2014   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   Hematuria 08/06/2013   Compression fracture of L2, with back and right groin pain 07/18/2013   History of colon polyps 05/18/2012   COPD (chronic obstructive pulmonary disease) (HCC) 12/24/2009   Hyperlipemia 09/20/2006   TOBACCO ABUSE 09/20/2006   Essential hypertension 09/20/2006   GERD 09/20/2006    Social History   Tobacco Use   Smoking status: Former    Current packs/day: 0.00    Average packs/day: 2.0 packs/day for 48.0 years (96.0 ttl pk-yrs)    Types: Cigarettes    Start date: 11/22/1972    Quit date: 11/22/2020    Years since quitting: 2.7   Smokeless tobacco: Never  Substance Use Topics   Alcohol use: No    Alcohol/week: 0.0 standard drinks of alcohol    Allergies  Allergen Reactions   Bee Venom Swelling    Current Meds  Medication Sig   albuterol (PROVENTIL) (2.5 MG/3ML) 0.083% nebulizer solution USE 1 AMPULE IN NEBULIZER EVERY 6 HOURS AS NEEDED.   albuterol (VENTOLIN HFA) 108 (90 Base) MCG/ACT inhaler INHALE 2 PUFFS BY MOUTH EVERY 4-6 HOURS AS NEEDED FOR WHEEZING   amLODipine (NORVASC) 5 MG tablet Take 5 mg by mouth daily.   aspirin EC 81 MG tablet Take 81 mg by mouth daily.   atorvastatin (LIPITOR) 80 MG tablet TAKE ONE TABLET BY MOUTH ONCE DAILY AT BEDTIME   BREZTRI AEROSPHERE 160-9-4.8 MCG/ACT AERO INHALE TWO PUFFS into THE lungs TWICE DAILY morning & bedtime   clopidogrel (PLAVIX) 75 MG tablet TAKE ONE TABLET BY MOUTH ONCE DAILY   Dupilumab (DUPIXENT) 300 MG/2ML SOPN Inject 300 mg into the skin every 14 (fourteen) days.   EPINEPHrine 0.3 mg/0.3 mL IJ SOAJ injection Inject 0.3 mg into the muscle as needed for anaphylaxis.   FLUoxetine (PROZAC) 20 MG capsule Take 20 mg by mouth daily.    furosemide (LASIX) 20 MG tablet TAKE ONE TABLET BY MOUTH EVERY DAY   isosorbide mononitrate (IMDUR) 30 MG 24 hr tablet Take 30 mg by mouth daily.   levocetirizine (XYZAL) 5 MG tablet Take 5 mg by mouth at bedtime.   linaclotide (LINZESS) 290 MCG CAPS capsule Take 290 mcg by mouth daily before breakfast.   lisinopril-hydrochlorothiazide (ZESTORETIC) 20-25 MG tablet TAKE ONE TABLET BY MOUTH ONCE DAILY   metFORMIN (GLUCOPHAGE) 500 MG tablet Take 500 mg by mouth daily.   montelukast (SINGULAIR) 10 MG tablet Take 10 mg by mouth daily.   Multiple Vitamin (MULTI-VITAMINS) TABS Take 1 tablet by mouth daily.   naloxone (NARCAN) nasal spray 4 mg/0.1 mL Place 0.4 mg into the nose once.   nitroGLYCERIN (NITROSTAT) 0.4 MG SL tablet DISSOLVE (1) TABLET UNDER TONGUE AS NEEDED TO RELIEVE CHEST PAIN. MAY REPEAT EVERY 5 MINUTES.   ondansetron (ZOFRAN) 4 MG tablet Take by mouth.   oxyCODONE (ROXICODONE) 15 MG immediate release tablet Take by mouth.   pantoprazole (PROTONIX) 40 MG tablet Take 1 tablet (40 mg total)  by mouth 2 (two) times daily before a meal.   polyethylene glycol (MIRALAX / GLYCOLAX) packet Take 17 g by mouth daily as needed (for constipation).   pregabalin (LYRICA) 150 MG capsule Take 150 mg by mouth daily.   rOPINIRole (REQUIP) 1 MG tablet Take 1 tablet by mouth at bedtime.   spironolactone (ALDACTONE) 25 MG tablet TAKE ONE TABLET BY MOUTH ONCE DAILY   tamsulosin (FLOMAX) 0.4 MG CAPS capsule TAKE ONE CAPSULE BY MOUTH EVERY DAY    Immunization History  Administered Date(s) Administered   DTaP 12/04/2018   Fluad Quad(high Dose 65+) 10/08/2021   Influenza, High Dose Seasonal PF 10/08/2021   Influenza, Seasonal, Injecte, Preservative Fre 08/01/2013, 08/12/2014, 08/26/2015, 08/06/2016, 09/26/2019   Influenza,inj,Quad PF,6+ Mos 08/01/2013, 08/12/2014   Influenza-Unspecified 10/30/2018, 09/26/2019, 09/10/2020   Moderna Sars-Covid-2 Vaccination 02/06/2020, 03/05/2020, 12/12/2020   PNEUMOCOCCAL  CONJUGATE-20 01/27/2022   Pneumococcal Polysaccharide-23 10/20/2014   Pneumococcal-Unspecified 10/20/2014        Objective:   BP 108/60 (BP Location: Right Arm, Cuff Size: Normal)   Pulse 79   Temp 97.6 F (36.4 C)   Ht 5' 10.25" (1.784 m)   Wt 213 lb (96.6 kg) Comment: per patient. in a wheelchair today  SpO2 95%   BMI 30.35 kg/m   SpO2: 95 % O2 Device: Nasal cannula O2 Flow Rate (L/min): 3 L/min O2 Type: Continuous O2  GENERAL: Well-developed, overweight gentleman, no acute distress.  HEAD: Normocephalic, atraumatic. EYES: Pupils equal, round, reactive to light.  No scleral icterus. MOUTH: Edentulous, oral mucosa moist, no thrush. NECK: Supple. No thyromegaly. Trachea midline. No JVD.  No adenopathy. PULMONARY: Good air entry bilaterally.  No wheezes noted today. CARDIOVASCULAR: S1 and S2. Regular rate and rhythm.  No rubs, murmurs or gallops heard. ABDOMEN: Benign. MUSCULOSKELETAL: No joint deformity, no clubbing, no edema. NEUROLOGIC: No focal deficits, no gait disturbance.  Speech is fluent. SKIN: Intact,warm,dry. PSYCH: Mood and behavior normal.       Assessment & Plan:     ICD-10-CM   1. Stage 3 severe COPD by GOLD classification (HCC)  J44.9    Patient appears to be well compensated No further COPD optimization achievable at present Continue Breztri, continue albuterol    2. COPD with asthma (HCC)  J44.89    Continue Dupixent Continue montelukast    3. Chronic respiratory failure with hypoxia (HCC)  J96.11    Patient compliant with oxygen at 3 L/min Patient notes benefit of therapy Continue same    4. Gastroesophageal reflux disease, unspecified whether esophagitis present  K21.9    Continue Protonix (pantoprazole) Has upcoming EGD    5. Grade II diastolic dysfunction  I51.89    This issue adds complexity to his management Follows with cardiology    6. Preoperative respiratory examination  Z01.811    Moderate to high risk for general  anesthesia Risk cannot be further reduced Patient aware of risk Limit neuromuscular blockade PCCM available if needed     Patient has upcoming PFTs.  Will have procedure prior to PFTs.  Needs evaluation of severe reflux as this is affecting respiratory status.  As noted he is a moderate to high risk for general anesthesia however not a prohibitive risk.  The patient is aware of the risks and is willing to proceed.  Will see the patient in follow-up 28 October as already scheduled.    Gailen Shelter, MD Advanced Bronchoscopy PCCM [facility]-    *This note was dictated using voice recognition software/Dragon.  Despite  best efforts to proofread, errors can occur which can change the meaning. Any transcriptional errors that result from this process are unintentional and may not be fully corrected at the time of dictation.

## 2023-08-26 ENCOUNTER — Telehealth: Payer: Self-pay | Admitting: Internal Medicine

## 2023-08-26 ENCOUNTER — Other Ambulatory Visit
Admission: RE | Admit: 2023-08-26 | Discharge: 2023-08-26 | Disposition: A | Discharge status: Home / Self Care | Payer: Medicare HMO | Source: Ambulatory Visit | Attending: Internal Medicine | Admitting: Internal Medicine

## 2023-08-26 ENCOUNTER — Inpatient Hospital Stay
Admission: EM | Admit: 2023-08-26 | Discharge: 2023-08-28 | DRG: 683 | Disposition: A | Discharge status: Home / Self Care | Payer: Medicare HMO | Attending: Internal Medicine | Admitting: Internal Medicine

## 2023-08-26 ENCOUNTER — Ambulatory Visit: Payer: Medicare HMO | Attending: Cardiovascular Disease | Admitting: Internal Medicine

## 2023-08-26 ENCOUNTER — Encounter: Payer: Self-pay | Admitting: Internal Medicine

## 2023-08-26 ENCOUNTER — Other Ambulatory Visit: Payer: Self-pay

## 2023-08-26 ENCOUNTER — Telehealth: Payer: Self-pay

## 2023-08-26 ENCOUNTER — Observation Stay: Payer: Medicare HMO

## 2023-08-26 ENCOUNTER — Ambulatory Visit (INDEPENDENT_AMBULATORY_CARE_PROVIDER_SITE_OTHER): Payer: Medicare HMO

## 2023-08-26 VITALS — BP 100/64 | HR 75 | Ht 71.0 in | Wt 211.1 lb

## 2023-08-26 DIAGNOSIS — E785 Hyperlipidemia, unspecified: Secondary | ICD-10-CM | POA: Diagnosis present

## 2023-08-26 DIAGNOSIS — Z8673 Personal history of transient ischemic attack (TIA), and cerebral infarction without residual deficits: Secondary | ICD-10-CM

## 2023-08-26 DIAGNOSIS — E871 Hypo-osmolality and hyponatremia: Secondary | ICD-10-CM | POA: Diagnosis not present

## 2023-08-26 DIAGNOSIS — E860 Dehydration: Secondary | ICD-10-CM | POA: Diagnosis present

## 2023-08-26 DIAGNOSIS — K219 Gastro-esophageal reflux disease without esophagitis: Secondary | ICD-10-CM | POA: Diagnosis present

## 2023-08-26 DIAGNOSIS — I739 Peripheral vascular disease, unspecified: Secondary | ICD-10-CM | POA: Diagnosis present

## 2023-08-26 DIAGNOSIS — I2510 Atherosclerotic heart disease of native coronary artery without angina pectoris: Secondary | ICD-10-CM | POA: Diagnosis present

## 2023-08-26 DIAGNOSIS — I10 Essential (primary) hypertension: Secondary | ICD-10-CM | POA: Diagnosis present

## 2023-08-26 DIAGNOSIS — Z79891 Long term (current) use of opiate analgesic: Secondary | ICD-10-CM

## 2023-08-26 DIAGNOSIS — I771 Stricture of artery: Secondary | ICD-10-CM

## 2023-08-26 DIAGNOSIS — R0602 Shortness of breath: Secondary | ICD-10-CM

## 2023-08-26 DIAGNOSIS — J9611 Chronic respiratory failure with hypoxia: Secondary | ICD-10-CM | POA: Diagnosis present

## 2023-08-26 DIAGNOSIS — Z7982 Long term (current) use of aspirin: Secondary | ICD-10-CM

## 2023-08-26 DIAGNOSIS — J449 Chronic obstructive pulmonary disease, unspecified: Secondary | ICD-10-CM | POA: Diagnosis present

## 2023-08-26 DIAGNOSIS — Z8249 Family history of ischemic heart disease and other diseases of the circulatory system: Secondary | ICD-10-CM

## 2023-08-26 DIAGNOSIS — F419 Anxiety disorder, unspecified: Secondary | ICD-10-CM | POA: Diagnosis present

## 2023-08-26 DIAGNOSIS — E878 Other disorders of electrolyte and fluid balance, not elsewhere classified: Secondary | ICD-10-CM | POA: Diagnosis present

## 2023-08-26 DIAGNOSIS — Z87891 Personal history of nicotine dependence: Secondary | ICD-10-CM

## 2023-08-26 DIAGNOSIS — D72829 Elevated white blood cell count, unspecified: Secondary | ICD-10-CM | POA: Diagnosis not present

## 2023-08-26 DIAGNOSIS — Z7984 Long term (current) use of oral hypoglycemic drugs: Secondary | ICD-10-CM

## 2023-08-26 DIAGNOSIS — N390 Urinary tract infection, site not specified: Secondary | ICD-10-CM | POA: Diagnosis present

## 2023-08-26 DIAGNOSIS — N400 Enlarged prostate without lower urinary tract symptoms: Secondary | ICD-10-CM | POA: Diagnosis present

## 2023-08-26 DIAGNOSIS — I959 Hypotension, unspecified: Secondary | ICD-10-CM | POA: Diagnosis not present

## 2023-08-26 DIAGNOSIS — Z7902 Long term (current) use of antithrombotics/antiplatelets: Secondary | ICD-10-CM

## 2023-08-26 DIAGNOSIS — K760 Fatty (change of) liver, not elsewhere classified: Secondary | ICD-10-CM | POA: Diagnosis present

## 2023-08-26 DIAGNOSIS — R079 Chest pain, unspecified: Secondary | ICD-10-CM | POA: Diagnosis not present

## 2023-08-26 DIAGNOSIS — Z9049 Acquired absence of other specified parts of digestive tract: Secondary | ICD-10-CM

## 2023-08-26 DIAGNOSIS — N179 Acute kidney failure, unspecified: Principal | ICD-10-CM | POA: Diagnosis present

## 2023-08-26 DIAGNOSIS — Z01810 Encounter for preprocedural cardiovascular examination: Secondary | ICD-10-CM

## 2023-08-26 DIAGNOSIS — J42 Unspecified chronic bronchitis: Secondary | ICD-10-CM

## 2023-08-26 DIAGNOSIS — Z91030 Bee allergy status: Secondary | ICD-10-CM

## 2023-08-26 DIAGNOSIS — F32A Depression, unspecified: Secondary | ICD-10-CM | POA: Diagnosis present

## 2023-08-26 DIAGNOSIS — D649 Anemia, unspecified: Secondary | ICD-10-CM | POA: Diagnosis present

## 2023-08-26 DIAGNOSIS — Z79899 Other long term (current) drug therapy: Secondary | ICD-10-CM

## 2023-08-26 DIAGNOSIS — Z7951 Long term (current) use of inhaled steroids: Secondary | ICD-10-CM

## 2023-08-26 DIAGNOSIS — M1990 Unspecified osteoarthritis, unspecified site: Secondary | ICD-10-CM | POA: Diagnosis present

## 2023-08-26 DIAGNOSIS — F418 Other specified anxiety disorders: Secondary | ICD-10-CM | POA: Diagnosis not present

## 2023-08-26 LAB — CBC
HCT: 32.6 % — ABNORMAL LOW (ref 39.0–52.0)
HCT: 32.7 % — ABNORMAL LOW (ref 39.0–52.0)
Hemoglobin: 11.2 g/dL — ABNORMAL LOW (ref 13.0–17.0)
Hemoglobin: 11.1 g/dL — ABNORMAL LOW (ref 13.0–17.0)
MCH: 29.9 pg (ref 26.0–34.0)
MCH: 30.3 pg (ref 26.0–34.0)
MCHC: 34.4 g/dL (ref 30.0–36.0)
MCHC: 33.9 g/dL (ref 30.0–36.0)
MCV: 86.9 fL (ref 80.0–100.0)
MCV: 89.3 fL (ref 80.0–100.0)
Platelets: 278 10*3/uL (ref 150–400)
Platelets: 272 10*3/uL (ref 150–400)
RBC: 3.75 MIL/uL — ABNORMAL LOW (ref 4.22–5.81)
RBC: 3.66 MIL/uL — ABNORMAL LOW (ref 4.22–5.81)
RDW: 12.3 % (ref 11.5–15.5)
RDW: 12.2 % (ref 11.5–15.5)
WBC: 12 10*3/uL — ABNORMAL HIGH (ref 4.0–10.5)
WBC: 11.6 10*3/uL — ABNORMAL HIGH (ref 4.0–10.5)
nRBC: 0 % (ref 0.0–0.2)
nRBC: 0 % (ref 0.0–0.2)

## 2023-08-26 LAB — URINALYSIS, ROUTINE W REFLEX MICROSCOPIC
Bilirubin Urine: NEGATIVE
Glucose, UA: NEGATIVE mg/dL
Ketones, ur: NEGATIVE mg/dL
Nitrite: NEGATIVE
Protein, ur: NEGATIVE mg/dL
Specific Gravity, Urine: 1.008 (ref 1.005–1.030)
pH: 5 (ref 5.0–8.0)

## 2023-08-26 LAB — BASIC METABOLIC PANEL
Anion gap: 13 (ref 5–15)
BUN: 23 mg/dL (ref 8–23)
CO2: 29 mmol/L (ref 22–32)
Calcium: 8.9 mg/dL (ref 8.9–10.3)
Chloride: 88 mmol/L — ABNORMAL LOW (ref 98–111)
Creatinine, Ser: 2 mg/dL — ABNORMAL HIGH (ref 0.61–1.24)
GFR, Estimated: 36 mL/min — ABNORMAL LOW (ref >60)
Glucose, Bld: 121 mg/dL — ABNORMAL HIGH (ref 70–99)
Potassium: 3.5 mmol/L (ref 3.5–5.1)
Sodium: 130 mmol/L — ABNORMAL LOW (ref 135–145)

## 2023-08-26 LAB — ECHOCARDIOGRAM COMPLETE
AR max vel: 3.93 cm2
AV Area VTI: 4.05 cm2
AV Area mean vel: 3.65 cm2
AV Mean grad: 3 mm[Hg]
AV Peak grad: 4.6 mm[Hg]
Ao pk vel: 1.07 m/s
Height: 71 in
S' Lateral: 3.3 cm
Weight: 3378 [oz_av]

## 2023-08-26 LAB — URINALYSIS, W/ REFLEX TO CULTURE (INFECTION SUSPECTED)
Bilirubin Urine: NEGATIVE
Glucose, UA: NEGATIVE mg/dL
Ketones, ur: NEGATIVE mg/dL
Nitrite: NEGATIVE
Protein, ur: NEGATIVE mg/dL
Specific Gravity, Urine: 1.009 (ref 1.005–1.030)
pH: 5 (ref 5.0–8.0)

## 2023-08-26 LAB — COMPREHENSIVE METABOLIC PANEL
ALT: 14 U/L (ref 0–44)
AST: 19 U/L (ref 15–41)
Albumin: 3.9 g/dL (ref 3.5–5.0)
Alkaline Phosphatase: 77 U/L (ref 38–126)
Anion gap: 14 (ref 5–15)
BUN: 22 mg/dL (ref 8–23)
CO2: 29 mmol/L (ref 22–32)
Calcium: 8.9 mg/dL (ref 8.9–10.3)
Chloride: 87 mmol/L — ABNORMAL LOW (ref 98–111)
Creatinine, Ser: 2.04 mg/dL — ABNORMAL HIGH (ref 0.61–1.24)
GFR, Estimated: 35 mL/min — ABNORMAL LOW (ref >60)
Glucose, Bld: 104 mg/dL — ABNORMAL HIGH (ref 70–99)
Potassium: 3.6 mmol/L (ref 3.5–5.1)
Sodium: 130 mmol/L — ABNORMAL LOW (ref 135–145)
Total Bilirubin: 0.8 mg/dL (ref 0.3–1.2)
Total Protein: 7.9 g/dL (ref 6.5–8.1)

## 2023-08-26 LAB — PROCALCITONIN: Procalcitonin: <0.1 ng/mL

## 2023-08-26 MED ORDER — SODIUM CHLORIDE 0.9 % IV SOLN: INTRAVENOUS | Status: DC

## 2023-08-26 MED ORDER — UMECLIDINIUM BROMIDE 62.5 MCG/ACT IN AEPB
1.0000 | INHALATION_SPRAY | Freq: Every day | RESPIRATORY_TRACT | Status: DC
  Administered 2023-08-27 – 2023-08-28 (×2): 1 via RESPIRATORY_TRACT
  Filled 2023-08-26: qty 7

## 2023-08-26 MED ORDER — ATORVASTATIN CALCIUM 20 MG PO TABS
80.0000 mg | ORAL_TABLET | Freq: Every day | ORAL | Status: DC
  Administered 2023-08-27 – 2023-08-28 (×2): 80 mg via ORAL
  Filled 2023-08-26 (×2): qty 4

## 2023-08-26 MED ORDER — ADULT MULTIVITAMIN W/MINERALS CH
1.0000 | ORAL_TABLET | Freq: Every day | ORAL | Status: DC
  Administered 2023-08-27 – 2023-08-28 (×2): 1 via ORAL
  Filled 2023-08-26 (×2): qty 1

## 2023-08-26 MED ORDER — HYDRALAZINE HCL 20 MG/ML IJ SOLN: 5.0000 mg | INTRAMUSCULAR | Status: DC | PRN

## 2023-08-26 MED ORDER — CLOPIDOGREL BISULFATE 75 MG PO TABS
75.0000 mg | ORAL_TABLET | Freq: Every day | ORAL | Status: DC
  Administered 2023-08-27 – 2023-08-28 (×2): 75 mg via ORAL
  Filled 2023-08-26 (×2): qty 1

## 2023-08-26 MED ORDER — OXYCODONE HCL 5 MG PO TABS: 5.0000 mg | ORAL_TABLET | Freq: Four times a day (QID) | ORAL | Status: DC | PRN

## 2023-08-26 MED ORDER — OXYCODONE HCL 5 MG PO TABS
15.0000 mg | ORAL_TABLET | ORAL | Status: DC | PRN
  Administered 2023-08-26 – 2023-08-28 (×9): 15 mg via ORAL
  Filled 2023-08-26 (×9): qty 3

## 2023-08-26 MED ORDER — ENOXAPARIN SODIUM 40 MG/0.4ML IJ SOSY
40.0000 mg | PREFILLED_SYRINGE | INTRAMUSCULAR | Status: DC
  Administered 2023-08-26 – 2023-08-27 (×2): 40 mg via SUBCUTANEOUS
  Filled 2023-08-26 (×2): qty 0.4

## 2023-08-26 MED ORDER — BUDESON-GLYCOPYRROL-FORMOTEROL 160-9-4.8 MCG/ACT IN AERO: 2.0000 | INHALATION_SPRAY | Freq: Two times a day (BID) | RESPIRATORY_TRACT | Status: DC

## 2023-08-26 MED ORDER — MONTELUKAST SODIUM 10 MG PO TABS
10.0000 mg | ORAL_TABLET | Freq: Every day | ORAL | Status: DC
  Administered 2023-08-27: 10 mg via ORAL
  Filled 2023-08-26: qty 1

## 2023-08-26 MED ORDER — PERFLUTREN LIPID MICROSPHERE
1.0000 mL | INTRAVENOUS | Status: AC | PRN
  Administered 2023-08-26: 2 mL via INTRAVENOUS

## 2023-08-26 MED ORDER — LORATADINE 10 MG PO TABS
10.0000 mg | ORAL_TABLET | Freq: Every day | ORAL | Status: DC
  Administered 2023-08-26 – 2023-08-27 (×2): 10 mg via ORAL
  Filled 2023-08-26 (×2): qty 1

## 2023-08-26 MED ORDER — ALBUTEROL SULFATE (2.5 MG/3ML) 0.083% IN NEBU
2.5000 mg | INHALATION_SOLUTION | RESPIRATORY_TRACT | Status: DC | PRN
  Administered 2023-08-27: 2.5 mg via RESPIRATORY_TRACT
  Filled 2023-08-26: qty 3

## 2023-08-26 MED ORDER — DM-GUAIFENESIN ER 30-600 MG PO TB12: 1.0000 | ORAL_TABLET | Freq: Two times a day (BID) | ORAL | Status: DC | PRN

## 2023-08-26 MED ORDER — OXYCODONE HCL 5 MG PO TABS
15.0000 mg | ORAL_TABLET | Freq: Once | ORAL | Status: AC
  Administered 2023-08-26: 15 mg via ORAL
  Filled 2023-08-26: qty 3

## 2023-08-26 MED ORDER — ACETAMINOPHEN 325 MG PO TABS: 650.0000 mg | ORAL_TABLET | Freq: Four times a day (QID) | ORAL | Status: DC | PRN

## 2023-08-26 MED ORDER — PANTOPRAZOLE SODIUM 40 MG PO TBEC
40.0000 mg | DELAYED_RELEASE_TABLET | Freq: Two times a day (BID) | ORAL | Status: DC
  Administered 2023-08-27 – 2023-08-28 (×3): 40 mg via ORAL
  Filled 2023-08-26 (×3): qty 1

## 2023-08-26 MED ORDER — PREGABALIN 75 MG PO CAPS
150.0000 mg | ORAL_CAPSULE | Freq: Every day | ORAL | Status: DC
  Administered 2023-08-27 – 2023-08-28 (×2): 150 mg via ORAL
  Filled 2023-08-26 (×2): qty 2

## 2023-08-26 MED ORDER — FLUTICASONE FUROATE-VILANTEROL 200-25 MCG/ACT IN AEPB
1.0000 | INHALATION_SPRAY | Freq: Every day | RESPIRATORY_TRACT | Status: DC
  Administered 2023-08-27 – 2023-08-28 (×2): 1 via RESPIRATORY_TRACT
  Filled 2023-08-26: qty 28

## 2023-08-26 MED ORDER — POLYETHYLENE GLYCOL 3350 17 G PO PACK: 17.0000 g | PACK | Freq: Every day | ORAL | Status: DC | PRN

## 2023-08-26 MED ORDER — ONDANSETRON HCL 4 MG/2ML IJ SOLN: 4.0000 mg | Freq: Three times a day (TID) | INTRAMUSCULAR | Status: DC | PRN

## 2023-08-26 MED ORDER — TAMSULOSIN HCL 0.4 MG PO CAPS
0.4000 mg | ORAL_CAPSULE | Freq: Every day | ORAL | Status: DC
  Administered 2023-08-27 – 2023-08-28 (×2): 0.4 mg via ORAL
  Filled 2023-08-26 (×2): qty 1

## 2023-08-26 MED ORDER — METFORMIN HCL 500 MG PO TABS: 500.0000 mg | ORAL_TABLET | Freq: Every day | ORAL | Status: DC

## 2023-08-26 MED ORDER — SODIUM CHLORIDE 0.9 % IV BOLUS
1000.0000 mL | Freq: Once | INTRAVENOUS | Status: AC
  Administered 2023-08-26: 1000 mL via INTRAVENOUS

## 2023-08-26 MED ORDER — ASPIRIN 81 MG PO TBEC
81.0000 mg | DELAYED_RELEASE_TABLET | Freq: Every day | ORAL | Status: DC
  Administered 2023-08-27 – 2023-08-28 (×2): 81 mg via ORAL
  Filled 2023-08-26 (×2): qty 1

## 2023-08-26 MED ORDER — LINACLOTIDE 145 MCG PO CAPS
290.0000 ug | ORAL_CAPSULE | Freq: Every day | ORAL | Status: DC
  Administered 2023-08-27 – 2023-08-28 (×2): 290 ug via ORAL
  Filled 2023-08-26 (×2): qty 2

## 2023-08-26 MED ORDER — ROPINIROLE HCL 1 MG PO TABS
1.0000 mg | ORAL_TABLET | Freq: Every day | ORAL | Status: DC
  Administered 2023-08-26 – 2023-08-27 (×2): 1 mg via ORAL
  Filled 2023-08-26 (×2): qty 1

## 2023-08-26 MED ORDER — FLUOXETINE HCL 20 MG PO CAPS
20.0000 mg | ORAL_CAPSULE | Freq: Every day | ORAL | Status: DC
  Administered 2023-08-27 – 2023-08-28 (×2): 20 mg via ORAL
  Filled 2023-08-26 (×2): qty 1

## 2023-08-26 MED ORDER — SODIUM CHLORIDE 0.9 % IV SOLN
1.0000 g | INTRAVENOUS | Status: DC
  Administered 2023-08-26 – 2023-08-28 (×2): 1 g via INTRAVENOUS
  Filled 2023-08-26 (×2): qty 10

## 2023-08-26 NOTE — H&P (Signed)
History and Physical    Bradley Sawyer ZOX:[id number] DOB: [birth year] DOA: 08/26/2023  Referring MD/NP/PA:   PCP: [name], MD   Patient coming from:  The patient is coming from home.     Chief Complaint: abnormal lab   HPI: Bradley Sawyer is a 67 y.o. male with medical history significant of GERD, CAD, PAD, carotid artery stenosis, right subclavian artery stenosis s/p endarterectomy, HTN, HLD, COPD on 3-4 L of home O2, stroke, BPH, depression with anxiety, who presents with abnormal lab.  Per pt and his daughter-in-law at the bedside, patient was recently seen by pulmonology for epigastric pain and shortness of breath.  He is found to have significant GERD on a swallow study and is scheduled for a endoscopy next week Tuesday. He was seen in the cardiology Dr. Serita Kyle office today for preoperative clearance. Pt was found to have abnormal lab with worsening renal function, low blood pressure, mild hyponatremia with sodium 130 and mild leukocytosis with WBC 12.0. He also had 2D echo today which showed EF 60 to 65%.  His blood pressure is 85/60 in ED which improved to 100/70 after giving 1 L normal saline. His sodium is 130 and WBC 11.6 in ED.  Patient reports acid reflux symptoms, nausea and dry heaves, poor appetite and decreased oral intake, but denies abdominal pain and diarrhea to me.  He has SOB and cough with thick white mucus production.  Denies chest pain.  No fever or chills.  No symptoms of UTI.  Of note, patient said taking Lasix and spironolactone, but denies history of CHF.  Patient is taking metformin, but does not know why he is on this medication.  His recent A1c was 6.3 on 07/12/2023, indicating possible prediabetes.   Data reviewed independently and ED Course: pt was found to have WBC 11.6, procalcitonin < 0.10, AKI with creatinine 2.0, BUN 23, GFR 36 (recent creatinine 1.05 on 01/26/2023), positive urinalysis (hazy appearance, moderate blood leukocyte, rare bacteria, WBC  21-50). Ttemperature normal, heart rate of 102, 57, RR 18, oxygen saturation 91-98% on 3 L oxygen.  Chest x-ray negative.  Patient is placed on telemetry bed for observation.   EKG: I have personally reviewed.  Sinus rhythm, QTc 396, low voltage, poor R wave progression, anteroseptal infarction pattern   Review of Systems:   General: no fevers, chills, no body weight gain, has poor appetite HEENT: no blurry vision, hearing changes or sore throat Respiratory: has dyspnea, coughing, no wheezing CV: no chest pain, no palpitations GI: no diarrhea, constipation.  Has acid reflux, nausea and dry heaves GU: no dysuria, burning on urination, increased urinary frequency, hematuria  Ext: no leg edema Neuro: no unilateral weakness, numbness, or tingling, no vision change or hearing loss Skin: no rash, no skin tear. MSK: No muscle spasm, no deformity, no limitation of range of movement in spin Heme: No easy bruising.  Travel history: No recent long distant travel.   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   Compression fracture    L2   COPD (chronic obstructive pulmonary disease) (HCC)    bronchitis   CVA (cerebral infarction)    Disorder of vocal cord    Fatty liver    GERD (gastroesophageal reflux  mg/dL 16/08/9603 5409   HGBUR MODERATE (A) 08/26/2023 2021   HGBUR MODERATE (A) 08/26/2023 2021   BILIRUBINUR NEGATIVE 08/26/2023 2021   BILIRUBINUR NEGATIVE 08/26/2023 2021    BILIRUBINUR Negative 02/09/2022 1025   BILIRUBINUR Negative 06/27/2013 2105   KETONESUR NEGATIVE 08/26/2023 2021   KETONESUR NEGATIVE 08/26/2023 2021   PROTEINUR NEGATIVE 08/26/2023 2021   PROTEINUR NEGATIVE 08/26/2023 2021   UROBILINOGEN 1.0 10/19/2014 1504   NITRITE NEGATIVE 08/26/2023 2021   NITRITE NEGATIVE 08/26/2023 2021   LEUKOCYTESUR MODERATE (A) 08/26/2023 2021   LEUKOCYTESUR MODERATE (A) 08/26/2023 2021   LEUKOCYTESUR Trace 06/27/2013 2105   Sepsis Labs: @LABRCNTIP (procalcitonin:4,lacticidven:4) )No results found for this or any previous visit (from the past 240 hour(s)).   Radiological Exams on Admission: DG Chest Port 1 View  Result Date: 08/26/2023 CLINICAL DATA:  Shortness of breath. EXAM: PORTABLE CHEST 1 VIEW COMPARISON:  Chest radiograph 02/26/2021, CT 03/01/2021 FINDINGS: Heart size upper normal. Stable mediastinal contours. Aortic atherosclerosis. No focal airspace disease, large pleural effusion or pneumothorax. No pulmonary edema. Prior median sternotomy. Density projecting over the left anterior left first rib is likely large spur when compared with CT. IMPRESSION: No acute chest findings. Electronically Signed   By: Narda Rutherford M.D.   On: 08/26/2023 21:54   ECHOCARDIOGRAM COMPLETE  Result Date: 08/26/2023    ECHOCARDIOGRAM REPORT   Patient Name:   Bradley Sawyer Date of Exam: 08/26/2023 Medical Rec #:  [id number]        Height:       71.0 in Accession #:    [id number]       Weight:       211.1 lb Date of Birth:  [birth year]        BSA:          2.158 m Patient Age:    67 years         BP:           100/64 mmHg Patient Gender: M                HR:           98 bpm. Exam Location:   Procedure: 2D Echo, Cardiac Doppler, Color Doppler and Intracardiac            Opacification Agent Indications:    R06.02 SOB  History:        Patient has prior history of Echocardiogram examinations, most                 recent 10/19/2021. CHF, CAD, COPD, PAD and Stroke,                  Signs/Symptoms:Shortness of Breath; Risk Factors:Hypertension,                 Dyslipidemia and Former Smoker.  Sonographer:    Quentin Ore RDMS, RVT, RDCS Referring Phys: [id number]  Sonographer Comments: Image acquisition challenging due to COPD. IMPRESSIONS  1. Left ventricular ejection fraction, by estimation, is 60 to 65%. The left ventricle has normal function. The left ventricle has no regional wall motion abnormalities. Left ventricular diastolic parameters are indeterminate.  2. Right ventricular systolic function is normal. The right ventricular size is normal.  3. The mitral valve is normal in structure. No evidence of mitral valve regurgitation.  4. The aortic valve was not well visualized. Aortic valve regurgitation is not visualized. FINDINGS  Left Ventricle: Left ventricular ejection fraction, by estimation, is 60 to 65%. The  mg/dL 16/08/9603 5409   HGBUR MODERATE (A) 08/26/2023 2021   HGBUR MODERATE (A) 08/26/2023 2021   BILIRUBINUR NEGATIVE 08/26/2023 2021   BILIRUBINUR NEGATIVE 08/26/2023 2021    BILIRUBINUR Negative 02/09/2022 1025   BILIRUBINUR Negative 06/27/2013 2105   KETONESUR NEGATIVE 08/26/2023 2021   KETONESUR NEGATIVE 08/26/2023 2021   PROTEINUR NEGATIVE 08/26/2023 2021   PROTEINUR NEGATIVE 08/26/2023 2021   UROBILINOGEN 1.0 10/19/2014 1504   NITRITE NEGATIVE 08/26/2023 2021   NITRITE NEGATIVE 08/26/2023 2021   LEUKOCYTESUR MODERATE (A) 08/26/2023 2021   LEUKOCYTESUR MODERATE (A) 08/26/2023 2021   LEUKOCYTESUR Trace 06/27/2013 2105   Sepsis Labs: @LABRCNTIP (procalcitonin:4,lacticidven:4) )No results found for this or any previous visit (from the past 240 hour(s)).   Radiological Exams on Admission: DG Chest Port 1 View  Result Date: 08/26/2023 CLINICAL DATA:  Shortness of breath. EXAM: PORTABLE CHEST 1 VIEW COMPARISON:  Chest radiograph 02/26/2021, CT 03/01/2021 FINDINGS: Heart size upper normal. Stable mediastinal contours. Aortic atherosclerosis. No focal airspace disease, large pleural effusion or pneumothorax. No pulmonary edema. Prior median sternotomy. Density projecting over the left anterior left first rib is likely large spur when compared with CT. IMPRESSION: No acute chest findings. Electronically Signed   By: Narda Rutherford M.D.   On: 08/26/2023 21:54   ECHOCARDIOGRAM COMPLETE  Result Date: 08/26/2023    ECHOCARDIOGRAM REPORT   Patient Name:   Bradley Sawyer Date of Exam: 08/26/2023 Medical Rec #:  [id number]        Height:       71.0 in Accession #:    [id number]       Weight:       211.1 lb Date of Birth:  [birth year]        BSA:          2.158 m Patient Age:    67 years         BP:           100/64 mmHg Patient Gender: M                HR:           98 bpm. Exam Location:   Procedure: 2D Echo, Cardiac Doppler, Color Doppler and Intracardiac            Opacification Agent Indications:    R06.02 SOB  History:        Patient has prior history of Echocardiogram examinations, most                 recent 10/19/2021. CHF, CAD, COPD, PAD and Stroke,                  Signs/Symptoms:Shortness of Breath; Risk Factors:Hypertension,                 Dyslipidemia and Former Smoker.  Sonographer:    Quentin Ore RDMS, RVT, RDCS Referring Phys: [id number] CHRISTOPHER END  Sonographer Comments: Image acquisition challenging due to COPD. IMPRESSIONS  1. Left ventricular ejection fraction, by estimation, is 60 to 65%. The left ventricle has normal function. The left ventricle has no regional wall motion abnormalities. Left ventricular diastolic parameters are indeterminate.  2. Right ventricular systolic function is normal. The right ventricular size is normal.  3. The mitral valve is normal in structure. No evidence of mitral valve regurgitation.  4. The aortic valve was not well visualized. Aortic valve regurgitation is not visualized. FINDINGS  Left Ventricle: Left ventricular ejection fraction, by estimation, is 60 to 65%. The  History and Physical    Bradley Sawyer ZOX:[id number] DOB: [birth year] DOA: 08/26/2023  Referring MD/NP/PA:   PCP: [name], MD   Patient coming from:  The patient is coming from home.     Chief Complaint: abnormal lab   HPI: Bradley Sawyer is a 67 y.o. male with medical history significant of GERD, CAD, PAD, carotid artery stenosis, right subclavian artery stenosis s/p endarterectomy, HTN, HLD, COPD on 3-4 L of home O2, stroke, BPH, depression with anxiety, who presents with abnormal lab.  Per pt and his daughter-in-law at the bedside, patient was recently seen by pulmonology for epigastric pain and shortness of breath.  He is found to have significant GERD on a swallow study and is scheduled for a endoscopy next week Tuesday. He was seen in the cardiology Dr. Serita Kyle office today for preoperative clearance. Pt was found to have abnormal lab with worsening renal function, low blood pressure, mild hyponatremia with sodium 130 and mild leukocytosis with WBC 12.0. He also had 2D echo today which showed EF 60 to 65%.  His blood pressure is 85/60 in ED which improved to 100/70 after giving 1 L normal saline. His sodium is 130 and WBC 11.6 in ED.  Patient reports acid reflux symptoms, nausea and dry heaves, poor appetite and decreased oral intake, but denies abdominal pain and diarrhea to me.  He has SOB and cough with thick white mucus production.  Denies chest pain.  No fever or chills.  No symptoms of UTI.  Of note, patient said taking Lasix and spironolactone, but denies history of CHF.  Patient is taking metformin, but does not know why he is on this medication.  His recent A1c was 6.3 on 07/12/2023, indicating possible prediabetes.   Data reviewed independently and ED Course: pt was found to have WBC 11.6, procalcitonin < 0.10, AKI with creatinine 2.0, BUN 23, GFR 36 (recent creatinine 1.05 on 01/26/2023), positive urinalysis (hazy appearance, moderate blood leukocyte, rare bacteria, WBC  21-50). Ttemperature normal, heart rate of 102, 57, RR 18, oxygen saturation 91-98% on 3 L oxygen.  Chest x-ray negative.  Patient is placed on telemetry bed for observation.   EKG: I have personally reviewed.  Sinus rhythm, QTc 396, low voltage, poor R wave progression, anteroseptal infarction pattern   Review of Systems:   General: no fevers, chills, no body weight gain, has poor appetite HEENT: no blurry vision, hearing changes or sore throat Respiratory: has dyspnea, coughing, no wheezing CV: no chest pain, no palpitations GI: no diarrhea, constipation.  Has acid reflux, nausea and dry heaves GU: no dysuria, burning on urination, increased urinary frequency, hematuria  Ext: no leg edema Neuro: no unilateral weakness, numbness, or tingling, no vision change or hearing loss Skin: no rash, no skin tear. MSK: No muscle spasm, no deformity, no limitation of range of movement in spin Heme: No easy bruising.  Travel history: No recent long distant travel.   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   Compression fracture    L2   COPD (chronic obstructive pulmonary disease) (HCC)    bronchitis   CVA (cerebral infarction)    Disorder of vocal cord    Fatty liver    GERD (gastroesophageal reflux  mg/dL 16/08/9603 5409   HGBUR MODERATE (A) 08/26/2023 2021   HGBUR MODERATE (A) 08/26/2023 2021   BILIRUBINUR NEGATIVE 08/26/2023 2021   BILIRUBINUR NEGATIVE 08/26/2023 2021    BILIRUBINUR Negative 02/09/2022 1025   BILIRUBINUR Negative 06/27/2013 2105   KETONESUR NEGATIVE 08/26/2023 2021   KETONESUR NEGATIVE 08/26/2023 2021   PROTEINUR NEGATIVE 08/26/2023 2021   PROTEINUR NEGATIVE 08/26/2023 2021   UROBILINOGEN 1.0 10/19/2014 1504   NITRITE NEGATIVE 08/26/2023 2021   NITRITE NEGATIVE 08/26/2023 2021   LEUKOCYTESUR MODERATE (A) 08/26/2023 2021   LEUKOCYTESUR MODERATE (A) 08/26/2023 2021   LEUKOCYTESUR Trace 06/27/2013 2105   Sepsis Labs: @LABRCNTIP (procalcitonin:4,lacticidven:4) )No results found for this or any previous visit (from the past 240 hour(s)).   Radiological Exams on Admission: DG Chest Port 1 View  Result Date: 08/26/2023 CLINICAL DATA:  Shortness of breath. EXAM: PORTABLE CHEST 1 VIEW COMPARISON:  Chest radiograph 02/26/2021, CT 03/01/2021 FINDINGS: Heart size upper normal. Stable mediastinal contours. Aortic atherosclerosis. No focal airspace disease, large pleural effusion or pneumothorax. No pulmonary edema. Prior median sternotomy. Density projecting over the left anterior left first rib is likely large spur when compared with CT. IMPRESSION: No acute chest findings. Electronically Signed   By: Narda Rutherford M.D.   On: 08/26/2023 21:54   ECHOCARDIOGRAM COMPLETE  Result Date: 08/26/2023    ECHOCARDIOGRAM REPORT   Patient Name:   Bradley Sawyer Date of Exam: 08/26/2023 Medical Rec #:  [id number]        Height:       71.0 in Accession #:    [id number]       Weight:       211.1 lb Date of Birth:  [birth year]        BSA:          2.158 m Patient Age:    67 years         BP:           100/64 mmHg Patient Gender: M                HR:           98 bpm. Exam Location:   Procedure: 2D Echo, Cardiac Doppler, Color Doppler and Intracardiac            Opacification Agent Indications:    R06.02 SOB  History:        Patient has prior history of Echocardiogram examinations, most                 recent 10/19/2021. CHF, CAD, COPD, PAD and Stroke,                  Signs/Symptoms:Shortness of Breath; Risk Factors:Hypertension,                 Dyslipidemia and Former Smoker.  Sonographer:    Quentin Ore RDMS, RVT, RDCS Referring Phys: [id number] CHRI[address]HER END  Sonographer Comments: Image acquisition challenging due to COPD. IMPRESSIONS  1. Left ventricular ejection fraction, by estimation, is 60 to 65%. The left ventricle has normal function. The left ventricle has no regional wall motion abnormalities. Left ventricular diastolic parameters are indeterminate.  2. Right ventricular systolic function is normal. The right ventricular size is normal.  3. The mitral valve is normal in structure. No evidence of mitral valve regurgitation.  4. The aortic valve was not well visualized. Aortic valve regurgitation is not visualized. FINDINGS  Left Ventricle: Left ventricular ejection fraction, by estimation, is 60 to 65%. The  mg/dL 16/08/9603 5409   HGBUR MODERATE (A) 08/26/2023 2021   HGBUR MODERATE (A) 08/26/2023 2021   BILIRUBINUR NEGATIVE 08/26/2023 2021   BILIRUBINUR NEGATIVE 08/26/2023 2021    BILIRUBINUR Negative 02/09/2022 1025   BILIRUBINUR Negative 06/27/2013 2105   KETONESUR NEGATIVE 08/26/2023 2021   KETONESUR NEGATIVE 08/26/2023 2021   PROTEINUR NEGATIVE 08/26/2023 2021   PROTEINUR NEGATIVE 08/26/2023 2021   UROBILINOGEN 1.0 10/19/2014 1504   NITRITE NEGATIVE 08/26/2023 2021   NITRITE NEGATIVE 08/26/2023 2021   LEUKOCYTESUR MODERATE (A) 08/26/2023 2021   LEUKOCYTESUR MODERATE (A) 08/26/2023 2021   LEUKOCYTESUR Trace 06/27/2013 2105   Sepsis Labs: @LABRCNTIP (procalcitonin:4,lacticidven:4) )No results found for this or any previous visit (from the past 240 hour(s)).   Radiological Exams on Admission: DG Chest Port 1 View  Result Date: 08/26/2023 CLINICAL DATA:  Shortness of breath. EXAM: PORTABLE CHEST 1 VIEW COMPARISON:  Chest radiograph 02/26/2021, CT 03/01/2021 FINDINGS: Heart size upper normal. Stable mediastinal contours. Aortic atherosclerosis. No focal airspace disease, large pleural effusion or pneumothorax. No pulmonary edema. Prior median sternotomy. Density projecting over the left anterior left first rib is likely large spur when compared with CT. IMPRESSION: No acute chest findings. Electronically Signed   By: Narda Rutherford M.D.   On: 08/26/2023 21:54   ECHOCARDIOGRAM COMPLETE  Result Date: 08/26/2023    ECHOCARDIOGRAM REPORT   Patient Name:   Bradley Sawyer Date of Exam: 08/26/2023 Medical Rec #:  [id number]        Height:       71.0 in Accession #:    [id number]       Weight:       211.1 lb Date of Birth:  [birth year]        BSA:          2.158 m Patient Age:    67 years         BP:           100/64 mmHg Patient Gender: M                HR:           98 bpm. Exam Location:   Procedure: 2D Echo, Cardiac Doppler, Color Doppler and Intracardiac            Opacification Agent Indications:    R06.02 SOB  History:        Patient has prior history of Echocardiogram examinations, most                 recent 10/19/2021. CHF, CAD, COPD, PAD and Stroke,                  Signs/Symptoms:Shortness of Breath; Risk Factors:Hypertension,                 Dyslipidemia and Former Smoker.  Sonographer:    Quentin Ore RDMS, RVT, RDCS Referring Phys: [id number] CHRISTOPHER END  Sonographer Comments: Image acquisition challenging due to COPD. IMPRESSIONS  1. Left ventricular ejection fraction, by estimation, is 60 to 65%. The left ventricle has normal function. The left ventricle has no regional wall motion abnormalities. Left ventricular diastolic parameters are indeterminate.  2. Right ventricular systolic function is normal. The right ventricular size is normal.  3. The mitral valve is normal in structure. No evidence of mitral valve regurgitation.  4. The aortic valve was not well visualized. Aortic valve regurgitation is not visualized. FINDINGS  Left Ventricle: Left ventricular ejection fraction, by estimation, is 60 to 65%. The  mg/dL 16/08/9603 5409   HGBUR MODERATE (A) 08/26/2023 2021   HGBUR MODERATE (A) 08/26/2023 2021   BILIRUBINUR NEGATIVE 08/26/2023 2021   BILIRUBINUR NEGATIVE 08/26/2023 2021    BILIRUBINUR Negative 02/09/2022 1025   BILIRUBINUR Negative 06/27/2013 2105   KETONESUR NEGATIVE 08/26/2023 2021   KETONESUR NEGATIVE 08/26/2023 2021   PROTEINUR NEGATIVE 08/26/2023 2021   PROTEINUR NEGATIVE 08/26/2023 2021   UROBILINOGEN 1.0 10/19/2014 1504   NITRITE NEGATIVE 08/26/2023 2021   NITRITE NEGATIVE 08/26/2023 2021   LEUKOCYTESUR MODERATE (A) 08/26/2023 2021   LEUKOCYTESUR MODERATE (A) 08/26/2023 2021   LEUKOCYTESUR Trace 06/27/2013 2105   Sepsis Labs: @LABRCNTIP (procalcitonin:4,lacticidven:4) )No results found for this or any previous visit (from the past 240 hour(s)).   Radiological Exams on Admission: DG Chest Port 1 View  Result Date: 08/26/2023 CLINICAL DATA:  Shortness of breath. EXAM: PORTABLE CHEST 1 VIEW COMPARISON:  Chest radiograph 02/26/2021, CT 03/01/2021 FINDINGS: Heart size upper normal. Stable mediastinal contours. Aortic atherosclerosis. No focal airspace disease, large pleural effusion or pneumothorax. No pulmonary edema. Prior median sternotomy. Density projecting over the left anterior left first rib is likely large spur when compared with CT. IMPRESSION: No acute chest findings. Electronically Signed   By: Narda Rutherford M.D.   On: 08/26/2023 21:54   ECHOCARDIOGRAM COMPLETE  Result Date: 08/26/2023    ECHOCARDIOGRAM REPORT   Patient Name:   Bradley Sawyer Date of Exam: 08/26/2023 Medical Rec #:  [id number]        Height:       71.0 in Accession #:    [id number]       Weight:       211.1 lb Date of Birth:  [birth year]        BSA:          2.158 m Patient Age:    67 years         BP:           100/64 mmHg Patient Gender: M                HR:           98 bpm. Exam Location:   Procedure: 2D Echo, Cardiac Doppler, Color Doppler and Intracardiac            Opacification Agent Indications:    R06.02 SOB  History:        Patient has prior history of Echocardiogram examinations, most                 recent 10/19/2021. CHF, CAD, COPD, PAD and Stroke,                  Signs/Symptoms:Shortness of Breath; Risk Factors:Hypertension,                 Dyslipidemia and Former Smoker.  Sonographer:    Quentin Ore RDMS, RVT, RDCS Referring Phys: [id number] CHRISTOPHER END  Sonographer Comments: Image acquisition challenging due to COPD. IMPRESSIONS  1. Left ventricular ejection fraction, by estimation, is 60 to 65%. The left ventricle has normal function. The left ventricle has no regional wall motion abnormalities. Left ventricular diastolic parameters are indeterminate.  2. Right ventricular systolic function is normal. The right ventricular size is normal.  3. The mitral valve is normal in structure. No evidence of mitral valve regurgitation.  4. The aortic valve was not well visualized. Aortic valve regurgitation is not visualized. FINDINGS  Left Ventricle: Left ventricular ejection fraction, by estimation, is 60 to 65%. The

## 2023-08-26 NOTE — Telephone Encounter (Signed)
Daughter in law stated they were taking patient to the [facility] ER and wanted the nurse to give the ER a head's up.

## 2023-08-26 NOTE — ED Notes (Signed)
Informed RN Alex via phone/ pt has bed assigned

## 2023-08-26 NOTE — ED Notes (Addendum)
ED TO INPATIENT HANDOFF REPORT  ED Nurse Name and Phone #: Merry Lofty [phone]  S Name/Age/Gender Littie Deeds Link 67 y.o. male Room/Bed: ED01A/ED01A  Code Status   Code Status: Full Code  Home/SNF/Other Home Patient oriented to: self, place, time, and situation Is this baseline? Yes   Triage Complete: Triage complete  Chief Complaint AKI (acute kidney injury) (HCC) [N17.9]  Triage Note Pt comes with c/o abnormal labs. Pt states he is having surgery  soon and has some labs done today. Pt states something with his kidney and his WBC.   Pt wears 3-4 L [place].    Allergies Allergies  Allergen Reactions   Bee Venom Swelling    Level of Care/Admitting Diagnosis ED Disposition     ED Disposition  Admit   Condition  --   Comment  Hospital Area: [facility] [[id number]]  Level of Care: Telemetry Medical [104]  Covid Evaluation: Asymptomatic - no recent exposure (last 10 days) testing not required  Diagnosis: AKI (acute kidney injury) [facility]) [[id number]]  Admitting Physician: Lorretta Harp [[id number]]  Attending Physician: Lorretta Harp [[id number]]          B Medical/Surgery History Past Medical History:  Diagnosis Date   Anxiety    Arthritis    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   Compression fracture    L2   COPD (chronic obstructive pulmonary disease) (HCC)    bronchitis   CVA (cerebral infarction)    Disorder of vocal cord    Fatty liver    GERD (gastroesophageal reflux disease)    Hx of cardiovascular stress test    ETT/Lexiscan-Myoview (10/15):  Inferior, inferoapical, apical lateral defect c/w mild ischemia, EF 66%; Intermediate Risk   Hyperlipidemia    Hypertension    Right shoulder pain    2/2 partial rotator cuff tear, tendinopathy, mild subacromial/subdeltioid bursitis, A/C joint arthropathy per MRI done in 3/07   Tobacco abuse    Past Surgical History:   Procedure Laterality Date   ANTERIOR LAT LUMBAR FUSION N/A 12/18/2013   Procedure: Lumbar Two Anteriorlateral Corpectomy w/ cage, interbody fusion, anterior plating, Lumbar One to Lumbar Three percutaneous pedicle screws;  Surgeon: Temple Pacini, MD;  Location: [facility] NEURO ORS;  Service: Neurosurgery;  Laterality: N/A;   AORTA -INNOMIATE BYPASS N/A 10/15/2014   Procedure: ENDARTERCTOMY OF  INNOMINATE ARTERY ;  Surgeon: Sherren Kerns, MD;  Location: [facility] OR;  Service: Vascular;  Laterality: N/A;   ARCH AORTOGRAM N/A 08/23/2014   Procedure: ARCH AORTOGRAM;  Surgeon: Sherren Kerns, MD;  Location: [facility] CATH LAB;  Service: Cardiovascular;  Laterality: N/A;   arch aortogram, left carotid,left subclavian angiogram  08/23/2014   CAROTID ANGIOGRAM Left 08/23/2014   Procedure: CAROTID ANGIOGRAM;  Surgeon: Sherren Kerns, MD;  Location: [facility] CATH LAB;  Service: Cardiovascular;  Laterality: Left;   CHOLECYSTECTOMY  12/2016   ENDARTERECTOMY Right 10/15/2014   Procedure: ENDARTERECTOMY RIGHT SUBCLAVIAN ARTERY;  Surgeon: Sherren Kerns, MD;  Location[facility] OR;  Service: Vascular;  Laterality: Right;   ENDARTERECTOMY Right 10/15/2014   Procedure: ENDARTERECTOMY RIGHT COMMON CAROTID ARTERY;  Surgeon: Sherren Kerns, MD;  Location: [facility] OR;  Service: Vascular;  Laterality: Right;   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   HERNIA REPAIR     LEFT HEART CATH AND CORONARY ANGIOGRAPHY N/A 03/17/2021   Procedure: LEFT HEART CATH AND CORONARY ANGIOGRAPHY;  Surgeon:  Laurier Nancy, MD;  Location: [facility] INVASIVE CV LAB;  Service: Cardiovascular;  Laterality: N/A;   LEFT HEART CATHETERIZATION WITH CORONARY ANGIOGRAM N/A 09/12/2014   Procedure: LEFT HEART CATHETERIZATION WITH CORONARY ANGIOGRAM;  Surgeon: Wendall Stade, MD;  Location: [facility] CATH LAB;  Service: Cardiovascular;  Laterality: N/A;   LUMBAR SPINE SURGERY  12/18/2013   L2     DR POOL   NECK SURGERY     PATCH ANGIOPLASTY Right 10/15/2014   Procedure: PATCH  ANGIOPLASTY OF RIGHT SUBCLAVIAN ARTERY , RIGHT COMMON CAROTID ARTERY & INNOMINATE ARTERY;  Surgeon: Sherren Kerns, MD;  Location: [facility] OR;  Service: Vascular;  Laterality: Right;   STERNOTOMY N/A 10/15/2014   Procedure: PARTIAL STERNOTOMY & PLATING OF STERNUM;  Surgeon: Sherren Kerns, MD;  Location: [facility] OR;  Service: Vascular;  Laterality: N/A;   TEE WITHOUT CARDIOVERSION N/A 12/25/2013   Procedure: TRANSESOPHAGEAL ECHOCARDIOGRAM (TEE);  Surgeon: Laurey Morale, MD;  Location: [facility] ENDOSCOPY;  Service: Cardiovascular;  Laterality: N/A;     A IV Location/Drains/Wounds Patient Lines/Drains/Airways Status     Active Line/Drains/Airways     Name Placement date Placement time Site Days   Peripheral IV 08/26/23 20 G Anterior;Right Forearm 08/26/23  1811  Forearm  less than 1            Intake/Output Last 24 hours  Intake/Output Summary (Last 24 hours) at 08/26/2023 1936 Last data filed at 08/26/2023 1920 Gross per 24 hour  Intake 1000 ml  Output --  Net 1000 ml    Labs/Imaging Results for orders placed or performed during the hospital encounter of 08/26/23 (from the past 48 hour(s))  Procalcitonin     Status: None   Collection Time: 08/26/23  1:37 PM  Result Value Ref Range   Procalcitonin <0.10 ng/mL    Comment:        Interpretation: PCT (Procalcitonin) <= 0.5 ng/mL: Systemic infection (sepsis) is not likely. Local bacterial infection is possible. (NOTE)       Sepsis PCT Algorithm           Lower Respiratory Tract                                      Infection PCT Algorithm    ----------------------------     ----------------------------         PCT < 0.25 ng/mL                PCT < 0.10 ng/mL          Strongly encourage             Strongly discourage   discontinuation of antibiotics    initiation of antibiotics    ----------------------------     -----------------------------       PCT 0.25 - 0.50 ng/mL            PCT 0.10 - 0.25 ng/mL               OR       >80%  decrease in PCT            Discourage initiation of                                            antibiotics  Encourage discontinuation           of antibiotics    ----------------------------     -----------------------------         PCT >= 0.50 ng/mL              PCT 0.26 - 0.50 ng/mL               AND        <80% decrease in PCT             Encourage initiation of                                             antibiotics       Encourage continuation           of antibiotics    ----------------------------     -----------------------------        PCT >= 0.50 ng/mL                  PCT > 0.50 ng/mL               AND         increase in PCT                  Strongly encourage                                      initiation of antibiotics    Strongly encourage escalation           of antibiotics                                     -----------------------------                                           PCT <= 0.25 ng/mL                                                 OR                                        > 80% decrease in PCT                                      Discontinue / Do not initiate                                             antibiotics  Performed at [facility], [address] [place], [place] [address]   CBC     Status: Abnormal   Collection Time: 08/26/23  4:27 PM  Result Value Ref  Range   WBC 11.6 (H) 4.0 - 10.5 K/uL   RBC 3.66 (L) 4.22 - 5.81 MIL/uL   Hemoglobin 11.1 (L) 13.0 - 17.0 g/dL   HCT 91.4 (L) 78.2 - 95.6 %   MCV 89.3 80.0 - 100.0 fL   MCH 30.3 26.0 - 34.0 pg   MCHC 33.9 30.0 - 36.0 g/dL   RDW 21.3 08.6 - 57.8 %   Platelets 272 150 - 400 K/uL   nRBC 0.0 0.0 - 0.2 %    Comment: Performed at [facility], [address] [place], [place] [address]  Basic metabolic panel     Status: Abnormal   Collection Time: 08/26/23  4:27 PM  Result Value Ref Range   Sodium 130 (L) 135 - 145 mmol/L   Potassium 3.5 3.5 - 5.1 mmol/L    Chloride 88 (L) 98 - 111 mmol/L   CO2 29 22 - 32 mmol/L   Glucose, Bld 121 (H) 70 - 99 mg/dL    Comment: Glucose reference range applies only to samples taken after fasting for at least 8 hours.   BUN 23 8 - 23 mg/dL   Creatinine, Ser 9.52 (H) 0.61 - 1.24 mg/dL   Calcium 8.9 8.9 - 84.1 mg/dL   GFR, Estimated 36 (L) >60 mL/min    Comment: (NOTE) Calculated using the CKD-EPI Creatinine Equation (2021)    Anion gap 13 5 - 15    Comment: Performed at [facility], [address] [place], [place] [address]   ECHOCARDIOGRAM COMPLETE  Result Date: 08/26/2023    ECHOCARDIOGRAM REPORT   Patient Name:   BAILEY WALDECKER Date of Exam: 08/26/2023 Medical Rec #:  [id number]        Height:       71.0 in Accession #:    [id number]       Weight:       211.1 lb Date of Birth:  [birth year]        BSA:          2.158 m Patient Age:    67 years         BP:           100/64 mmHg Patient Gender: M                HR:           98 bpm. Exam Location:  [place] Procedure: 2D Echo, Cardiac Doppler, Color Doppler and Intracardiac            Opacification Agent Indications:    R06.02 SOB  History:        Patient has prior history of Echocardiogram examinations, most                 recent 10/19/2021. CHF, CAD, COPD, PAD and Stroke,                 Signs/Symptoms:Shortness of Breath; Risk Factors:Hypertension,                 Dyslipidemia and Former Smoker.  Sonographer:    Quentin Ore RDMS, RVT, RDCS Referring Phys: [id number] CHRISTOPHER END  Sonographer Comments: Image acquisition challenging due to COPD. IMPRESSIONS  1. Left ventricular ejection fraction, by estimation, is 60 to 65%. The left ventricle has normal function. The left ventricle has no regional wall motion abnormalities. Left ventricular diastolic parameters are indeterminate.  2. Right ventricular systolic function is normal. The right ventricular size is normal.  3. The mitral  valve is normal in structure. No evidence of mitral valve regurgitation.  4. The  aortic valve was not well visualized. Aortic valve regurgitation is not visualized. FINDINGS  Left Ventricle: Left ventricular ejection fraction, by estimation, is 60 to 65%. The left ventricle has normal function. The left ventricle has no regional wall motion abnormalities. Definity contrast agent was given IV to delineate the left ventricular  endocardial borders. The left ventricular internal cavity size was normal in size. There is no left ventricular hypertrophy. Left ventricular diastolic parameters are indeterminate. Right Ventricle: The right ventricular size is normal. No increase in right ventricular wall thickness. Right ventricular systolic function is normal. Left Atrium: Left atrial size was normal in size. Right Atrium: Right atrial size was normal in size. Pericardium: There is no evidence of pericardial effusion. Mitral Valve: The mitral valve is normal in structure. No evidence of mitral valve regurgitation. Tricuspid Valve: The tricuspid valve is normal in structure. Tricuspid valve regurgitation is not demonstrated. Aortic Valve: The aortic valve was not well visualized. Aortic valve regurgitation is not visualized. Aortic valve mean gradient measures 3.0 mmHg. Aortic valve peak gradient measures 4.6 mmHg. Aortic valve area, by VTI measures 4.05 cm. Pulmonic Valve: The pulmonic valve was normal in structure. Pulmonic valve regurgitation is not visualized. Aorta: The aortic root and ascending aorta are structurally normal, with no evidence of dilitation. IAS/Shunts: No atrial level shunt detected by color flow Doppler.  LEFT VENTRICLE PLAX 2D LVIDd:         5.20 cm LVIDs:         3.30 cm LV PW:         0.60 cm LV IVS:        0.70 cm LVOT diam:     2.50 cm LV SV:         62 LV SV Index:   29 LVOT Area:     4.91 cm  RIGHT VENTRICLE RV S prime:     13.50 cm/s LEFT ATRIUM             Index LA diam:        3.50 cm 1.62 cm/m LA Vol (A2C):   33.9 ml 15.71 ml/m LA Vol (A4C):   31.2 ml 14.46 ml/m LA  Biplane Vol: 34.5 ml 15.99 ml/m  AORTIC VALVE                    PULMONIC VALVE AV Area (Vmax):    3.93 cm     PV Vmax:       0.84 m/s AV Area (Vmean):   3.65 cm     PV Peak grad:  2.8 mmHg AV Area (VTI):     4.05 cm AV Vmax:           106.75 cm/s AV Vmean:          75.800 cm/s AV VTI:            0.154 m AV Peak Grad:      4.6 mmHg AV Mean Grad:      3.0 mmHg LVOT Vmax:         85.53 cm/s LVOT Vmean:        56.333 cm/s LVOT VTI:          0.127 m LVOT/AV VTI ratio: 0.82  AORTA Ao Root diam: 3.60 cm Ao Asc diam:  3.20 cm TRICUSPID VALVE TR Peak grad:   12.1 mmHg TR Vmax:  174.00 cm/s  SHUNTS Systemic VTI:  0.13 m Systemic Diam: 2.50 cm Debbe Odea MD Electronically signed by Debbe Odea MD Signature Date/Time: 08/26/2023/1:59:45 PM    Final     Pending Labs Unresulted Labs (From admission, onward)     Start     Ordered   08/27/23 0500  Basic metabolic panel  Tomorrow morning,   R        08/26/23 1914   08/27/23 0500  CBC  Tomorrow morning,   R        08/26/23 1914   08/26/23 1914  HIV Antibody (routine testing w rflx)  (HIV Antibody (Routine testing w reflex) panel)  Once,   R        08/26/23 1914   08/26/23 1627  Urinalysis, Routine w reflex microscopic -Urine, Random  Once,   URGENT       Question:  Specimen Source  Answer:  Urine, Random   08/26/23 1626            Vitals/Pain Today's Vitals   08/26/23 1730 08/26/23 1800 08/26/23 1845 08/26/23 1900  BP: 100/70 110/73  (!) 103/53  Pulse: (!) 57 (!) 105 74   Resp: 13 16 14  (!) 23  Temp:      SpO2: 98% 97% 97% 98%  PainSc:        Isolation Precautions No active isolations  Medications Medications  albuterol (PROVENTIL) (2.5 MG/3ML) 0.083% nebulizer solution 2.5 mg (has no administration in time range)  dextromethorphan-guaiFENesin (MUCINEX DM) 30-600 MG per 12 hr tablet 1 tablet (has no administration in time range)  ondansetron (ZOFRAN) injection 4 mg (has no administration in time range)  hydrALAZINE  (APRESOLINE) injection 5 mg (has no administration in time range)  acetaminophen (TYLENOL) tablet 650 mg (has no administration in time range)  enoxaparin (LOVENOX) injection 40 mg (40 mg Subcutaneous Given 08/26/23 1922)  aspirin EC tablet 81 mg (has no administration in time range)  oxyCODONE (Oxy IR/ROXICODONE) immediate release tablet 5 mg (has no administration in time range)  atorvastatin (LIPITOR) tablet 80 mg (has no administration in time range)  FLUoxetine (PROZAC) capsule 20 mg (has no administration in time range)  metFORMIN (GLUCOPHAGE) tablet 500 mg (has no administration in time range)  linaclotide (LINZESS) capsule 290 mcg (has no administration in time range)  pantoprazole (PROTONIX) EC tablet 40 mg (has no administration in time range)  polyethylene glycol (MIRALAX / GLYCOLAX) packet 17 g (has no administration in time range)  tamsulosin (FLOMAX) capsule 0.4 mg (has no administration in time range)  clopidogrel (PLAVIX) tablet 75 mg (has no administration in time range)  pregabalin (LYRICA) capsule 150 mg (has no administration in time range)  rOPINIRole (REQUIP) tablet 1 mg (has no administration in time range)  Multi-Vitamins TABS 1 tablet (has no administration in time range)  Budeson-Glycopyrrol-Formoterol 160-9-4.8 MCG/ACT AERO 2 puff (has no administration in time range)  levocetirizine (XYZAL) tablet 5 mg (has no administration in time range)  montelukast (SINGULAIR) tablet 10 mg (has no administration in time range)  0.9 %  sodium chloride infusion (has no administration in time range)  oxyCODONE (Oxy IR/ROXICODONE) immediate release tablet 15 mg (15 mg Oral Given 08/26/23 1805)  sodium chloride 0.9 % bolus 1,000 mL (0 mLs Intravenous Stopped 08/26/23 1920)    Mobility walks with device     Focused Assessments         R Recommendations: See Admitting Provider Note  Report given to:   Additional Notes: Sent from  Physician for "abnormal labs". Creat-2.0  on arrival. Pt reports feeling "slightly dizzy" but otherwise asymptomatic. Pt on 3-4L [place] at baseline. Uses a cane at home. Believes he might do better having a powered wheelchair to get around with after discharge. Only other pertinent lab is Na-130. 20G in Rt forearm for IV access.

## 2023-08-26 NOTE — ED Triage Notes (Addendum)
Pt comes with c/o abnormal labs. Pt states he is having surgery  soon and has some labs done today. Pt states something with his kidney and his WBC.   Pt wears 3-4 L [place].

## 2023-08-26 NOTE — Progress Notes (Signed)
Cardiology Office Note:  .   Date:  08/26/2023  ID:  Bradley Sawyer, DOB 17-Dec-1955, MRN [id number] PCP: [name], MD  [facility] Providers Cardiologist:  New  History of Present Illness: .   Bradley Sawyer is a 67 y.o. male with history of nonobstructive coronary artery disease, right subclavian artery stenosis status post endarterectomy of the brachiocephalic and subclavian arteries, hypertension, hyperlipidemia, COPD, and tobacco abuse, presenting for preoperative cardiovascular risk assessment in anticipation of colonoscopy.  He was last seen in our practice in 2017 by Dr. Eden Emms, which time he was feeling well.  It appears that he has subsequently followed with Dr. Welton Flakes and underwent catheterization in 02/2021.  Mild luminal irregularities were noted on the cath without significant disease.  Today, Bradley Sawyer is confused as to why he is in our office.  He is accompanied by his daughter-in-law who provides some history.  He typically follows with Dr. Welton Flakes at [facility] cardiology, having last been seen about a year ago.  He has been feeling more short of breath and also complains of frequent pain in his chest.  It sometimes happens with exertion and occasionally happens at other times.  He is followed closely with Dr. Jayme Cloud in the pulmonary clinic and was recently noted to have significant GERD on a swallow study.  He is scheduled for upper and lower endoscopy next week.  He has chronic shortness of breath with minimal activity and wears 3 to 4 L of oxygen via nasal cannula.  He gets fatigued but has not had any lightheadedness or syncope.  He also denies palpitations and edema.  He no longer follows with vascular surgery following his right subclavian and innominate artery endarterectomy several years ago.  ROS: See HPI  Studies Reviewed: Marland Kitchen   EKG Interpretation Date/Time:  Friday August 26 2023 09:42:43 EDT Ventricular Rate:  75 PR Interval:  164 QRS  Duration:  104 QT Interval:  412 QTC Calculation: 460 R Axis:   -12  Text Interpretation: Normal sinus rhythm Normal ECG When compared with ECG of 22-Sep-2021 12:35, HEART RATE has increased Confirmed by Carlene Bickley, Cristal Deer [phone]) on 08/26/2023 1:37:43 PM    LHC (03/17/2021): Mild luminal parities.  No angiographically significant coronary artery disease.  Risk Assessment/Calculations:            Physical Exam:   VS:  BP 100/64 (BP Location: Left Arm)   Pulse 75   Ht 5\' 11"  (1.803 m)   Wt 211 lb 2 oz (95.8 kg)   SpO2 98%   BMI 29.45 kg/m    Wt Readings from Last 3 Encounters:  08/26/23 211 lb 2 oz (95.8 kg)  08/23/23 213 lb (96.6 kg)  08/16/23 215 lb (97.5 kg)    General: Chronically ill-appearing man seated in a wheelchair.. Neck: Unable to assess JVP due to facial hair. Lungs: Diminished breath sounds throughout with diffuse wheezes and rhonchi. Heart: Regular rate and rhythm without murmurs, rubs, or gallops. Abdomen: Soft, nontender, nondistended. Extremities: No lower extremity edema.  ASSESSMENT AND PLAN: .    Chest pain and shortness of breath: These have been chronic issues, though Bradley Sawyer reports that he is feeling worse.  Sometimes, it seems like his chest discomfort and breathing are worse with activity, though they can also worsen with other things such as eating.  His chest pain has both typical and atypical features.  It is notable that recent swallow study showed significant GERD that could be contributing to his  symptoms and has prompted upcoming EGD.  From a heart standpoint, I am reassured that coronary insufficiency is less likely given cardiac catheterization done in 02/2021 showing only mild luminal irregularities.  Echocardiogram in 09/2021 showed normal LVEF without significant abnormalities.  I have recommended repeating an echocardiogram as soon as possible to ensure that he has not developed a new cardiomyopathy or pericardial effusion, as pulses paradoxus  is evident on exam today (this could also be due to his advanced COPD).  He also has soft blood pressures, worse on the right, suggesting some recurrent stenosis in the right innominate/subclavian artery.  I will also check a CBC and CMP today to ensure that he has not developed anemia or worsening renal/hepatic function in the setting of his fatigue.  Right subclavian artery and brachiocephalic artery stenosis: Patient with history of stenosis status post endarterectomy several years ago.  Differential blood pressures are noted today suggesting there could be interval restenosis.  I recommend that Bradley Sawyer speak with Dr. Welton Flakes and his PCP about referral back to vascular surgery for reevaluation.  COPD and chronic respiratory failure with hypoxia: Continue supplemental oxygen use and ongoing management per pulmonology.  Hypertension: Blood pressure borderline low today with differential between right and left arms (lower on the right.  Given some pulses paradoxus noted today (2 difficult to assess severity), we will obtain an echocardiogram.  I have also recommended discontinuation of amlodipine and outpatient consultation with vascular surgery given differential blood pressures and history of right subclavian artery stenosis.  Preoperative cardiovascular risk assessment: Bradley Sawyer has poor functional status and complains of chronic dyspnea and chest discomfort.  I do not think he would tolerate a pharmacologic myocardial perfusion stress test on account of his obstructive lung disease.  Cardiac catheterization just over 2 years ago showed minimal CAD.  As above, we will obtain an echocardiogram and labs.  If these are reassuring, I think Bradley Sawyer can proceed with his endoscopic procedures, with our low risk from a cardiovascular standpoint, without additional cardiac testing or intervention.    Dispo: Follow-up with Dr. Welton Flakes at his earliest convenience.  Signed, Yvonne Kendall, MD

## 2023-08-26 NOTE — ED Provider Triage Note (Signed)
Emergency Medicine Provider Triage Evaluation Note  Bradley Sawyer , a 67 y.o. male  was evaluated in triage.  Pt complains of abnormal labs.  He got lab work done in preparation for endoscopy and colonoscopy.  He was sent here by his doctor. Patient thinks his kidney function was abnormal.   Review of Systems  Positive: none Negative: Urinary symptoms, pain  Physical Exam  There were no vitals taken for this visit. Gen:   Awake, no distress   Resp:  Normal effort, O2 through nasal cannula  MSK:   Moves extremities without difficulty  Other:    Medical Decision Making  Medically screening exam initiated at 4:23 PM.  Appropriate orders placed.  Bradley Sawyer was informed that the remainder of the evaluation will be completed by another provider, this initial triage assessment does not replace that evaluation, and the importance of remaining in the ED until their evaluation is complete.  Patient's recent labs shows hyponatremia, hypochloremia, elevated creatinine, mildly elevated white count and anemia.   Bradley Ali, PA-C 08/26/23 1626

## 2023-08-26 NOTE — Telephone Encounter (Signed)
Pt requesting call back has prep questions before procedure

## 2023-08-26 NOTE — Telephone Encounter (Signed)
Follow Up:     [name] called back and said she would like for the nurse to please call her today. She wants  to find out about his procedure.

## 2023-08-26 NOTE — Patient Instructions (Addendum)
Medication Instructions:  Your physician recommends the following medication changes.  STOP TAKING: Amlodipine   *If you need a refill on your cardiac medications before your next appointment, please call your pharmacy*   Lab Work: Your provider would like for you to have following labs drawn today (CBC, CMP).     Testing/Procedures: Your physician has requested that you have an echocardiogram. Echocardiography is a painless test that uses sound waves to create images of your heart. It provides your doctor with information about the size and shape of your heart and how well your heart's chambers and valves are working.   You may receive an ultrasound enhancing agent through an IV if needed to better visualize your heart during the echo. This procedure takes approximately one hour.  There are no restrictions for this procedure.  This will take place at [address] [address], [place] [address]    Follow-Up: At [facility], you and your health needs are our priority.  As part of our continuing mission to provide you with exceptional heart care, we have created designated Provider Care Teams.  These Care Teams include your primary Cardiologist (physician) and Advanced Practice Providers (APPs -  Physician Assistants and Nurse Practitioners) who all work together to provide you with the care you need, when you need it.  We recommend signing up for the patient portal called "MyChart".  Sign up information is provided on this After Visit Summary.  MyChart is used to connect with patients for Virtual Visits (Telemedicine).  Patients are able to view lab/test results, encounter notes, upcoming appointments, etc.  Non-urgent messages can be sent to your provider as well.   To learn more about what you can do with MyChart, go to [email or web address].    Your next appointment:   Follow up with Dr. Welton Flakes

## 2023-08-26 NOTE — ED Provider Notes (Signed)
[facility] Provider Note    Event Date/Time   First MD Initiated Contact with Patient 08/26/23 1701     (approximate)   History   abnormal labs   HPI  Bradley Sawyer is a 67 year old male with history of CAD, right subclavian artery stenosis s/p endarterectomy, HTN, HLD, COPD on 3.5L home O2 presenting to the emergency department for evaluation of abnormal labs.  Patient's daughter primarily provides history.  She reports that patient was seen by pulmonology for progressive epigastric pain and shortness of breath.  He is found to have significant GERD on a swallow study and is scheduled for a endoscopy next week Tuesday.  In the setting of this, he has had poor p.o. intake due to significant reflux symptoms.   He was seen in the cardiology office today for preoperative clearance.  While there, he was noted to have low blood pressures.  Labs and an echo were ordered.  The echo was reassuring, but his labs demonstrated a new significant AKI as well as mild hyponatremia and a slight elevated white blood cell count.  It was recommended that patient present to the ER for further evaluation.  Patient reports that he has had ongoing fatigue, shortness of breath, chest pain, not acutely worsened over the past couple days.  He denies other complaints.    Physical Exam   Triage Vital Signs: ED Triage Vitals  Encounter Vitals Group     BP 08/26/23 1627 94/72     Systolic BP Percentile --      Diastolic BP Percentile --      Pulse Rate 08/26/23 1627 70     Resp 08/26/23 1627 18     Temp 08/26/23 1627 98.2 F (36.8 C)     Temp src --      SpO2 08/26/23 1627 91 %     Weight --      Height --      Head Circumference --      Peak Flow --      Pain Score 08/26/23 1625 0     Pain Loc --      Pain Education --      Exclude from Growth Chart --     Most recent vital signs: Vitals:   08/26/23 2000 08/26/23 2019  BP:  119/68  Pulse:  (!) 41  Resp:  20  Temp:  98.1 F (36.7 C) 98.1 F (36.7 C)  SpO2:  93%     General: Awake, interactive  CV:  Regular rate, good peripheral perfusion.  Resp:  Lung sounds with occasional wheeze, respirations not significantly labored, satting at 100% on 3 L Abd:  Soft, nondistended.  Neuro:  Symmetric facial movement, fluid speech   ED Results / Procedures / Treatments   Labs (all labs ordered are listed, but only abnormal results are displayed) Labs Reviewed  URINALYSIS, ROUTINE W REFLEX MICROSCOPIC - Abnormal; Notable for the following components:      Result Value   Color, Urine YELLOW (*)    APPearance HAZY (*)    Hgb urine dipstick MODERATE (*)    Leukocytes,Ua MODERATE (*)    Bacteria, UA RARE (*)    All other components within normal limits  CBC - Abnormal; Notable for the following components:   WBC 11.6 (*)    RBC 3.66 (*)    Hemoglobin 11.1 (*)    HCT 32.7 (*)    All other components within normal limits  BASIC METABOLIC  PANEL - Abnormal; Notable for the following components:   Sodium 130 (*)    Chloride 88 (*)    Glucose, Bld 121 (*)    Creatinine, Ser 2.00 (*)    GFR, Estimated 36 (*)    All other components within normal limits  URINALYSIS, W/ REFLEX TO CULTURE (INFECTION SUSPECTED) - Abnormal; Notable for the following components:   Color, Urine YELLOW (*)    APPearance HAZY (*)    Hgb urine dipstick MODERATE (*)    Leukocytes,Ua MODERATE (*)    Bacteria, UA RARE (*)    All other components within normal limits  CULTURE, BLOOD (ROUTINE X 2)  CULTURE, BLOOD (ROUTINE X 2)  URINE CULTURE  PROCALCITONIN  BASIC METABOLIC PANEL  CBC  HIV ANTIBODY (ROUTINE TESTING W REFLEX)     EKG EKG independently reviewed interpreted by myself (ER attending) demonstrates:    RADIOLOGY Imaging independently reviewed and interpreted by myself demonstrates:    PROCEDURES:  Critical Care performed: No  Procedures   MEDICATIONS ORDERED IN ED: Medications  albuterol (PROVENTIL)  (2.5 MG/3ML) 0.083% nebulizer solution 2.5 mg (has no administration in time range)  dextromethorphan-guaiFENesin (MUCINEX DM) 30-600 MG per 12 hr tablet 1 tablet (has no administration in time range)  ondansetron (ZOFRAN) injection 4 mg (has no administration in time range)  hydrALAZINE (APRESOLINE) injection 5 mg (has no administration in time range)  acetaminophen (TYLENOL) tablet 650 mg (has no administration in time range)  enoxaparin (LOVENOX) injection 40 mg (40 mg Subcutaneous Given 08/26/23 1922)  aspirin EC tablet 81 mg (has no administration in time range)  atorvastatin (LIPITOR) tablet 80 mg (has no administration in time range)  FLUoxetine (PROZAC) capsule 20 mg (has no administration in time range)  metFORMIN (GLUCOPHAGE) tablet 500 mg (has no administration in time range)  linaclotide (LINZESS) capsule 290 mcg (has no administration in time range)  pantoprazole (PROTONIX) EC tablet 40 mg (has no administration in time range)  polyethylene glycol (MIRALAX / GLYCOLAX) packet 17 g (has no administration in time range)  tamsulosin (FLOMAX) capsule 0.4 mg (has no administration in time range)  clopidogrel (PLAVIX) tablet 75 mg (has no administration in time range)  pregabalin (LYRICA) capsule 150 mg (has no administration in time range)  rOPINIRole (REQUIP) tablet 1 mg (1 mg Oral Given 08/26/23 2236)  multivitamin with minerals tablet 1 tablet (has no administration in time range)  loratadine (CLARITIN) tablet 10 mg (10 mg Oral Given 08/26/23 2236)  montelukast (SINGULAIR) tablet 10 mg (has no administration in time range)  cefTRIAXone (ROCEPHIN) 1 g in sodium chloride 0.9 % 100 mL IVPB (1 g Intravenous New Bag/Given 08/26/23 2231)  0.9 %  sodium chloride infusion ( Intravenous New Bag/Given 08/26/23 2230)  fluticasone furoate-vilanterol (BREO ELLIPTA) 200-25 MCG/ACT 1 puff (has no administration in time range)    And  umeclidinium bromide (INCRUSE ELLIPTA) 62.5 MCG/ACT 1 puff (has no  administration in time range)  oxyCODONE (Oxy IR/ROXICODONE) immediate release tablet 15 mg (15 mg Oral Given 08/26/23 2300)  oxyCODONE (Oxy IR/ROXICODONE) immediate release tablet 15 mg (15 mg Oral Given 08/26/23 1805)  sodium chloride 0.9 % bolus 1,000 mL (0 mLs Intravenous Stopped 08/26/23 1920)     IMPRESSION / MDM / ASSESSMENT AND PLAN / ED COURSE  I reviewed the triage vital signs and the nursing notes.  Differential diagnosis includes, but is not limited to, AKI secondary to dehydration in the setting of decreased p.o. intake and diuretic use, intrinsic kidney pathology, lower  suspicion obstructive pathology  Patient's presentation is most consistent with acute presentation with potential threat to life or bodily function.  67 year old male presenting to the emergency department for evaluation of low blood pressure, AKI in the setting of decreased p.o. intake secondary to GERD symptoms.  I did review his medication list.  He has documentation of prescriptions for Lasix, Imdur, lisinopril-HCTZ, and spironolactone all from this year.  I do suspect that his AKI and low blood pressure likely related to his decreased p.o. intake and use of these medications.  Will reach out to hospitalist team to discuss admission for further monitoring of his renal function, blood pressure.    FINAL CLINICAL IMPRESSION(S) / ED DIAGNOSES   Final diagnoses:  Acute kidney injury (HCC)     Rx / DC Orders   ED Discharge Orders     None        Note:  This document was prepared using Dragon voice recognition software and may include unintentional dictation errors.   Trinna Post, MD 08/26/23 [phone]

## 2023-08-26 NOTE — Telephone Encounter (Signed)
Follow UP:      Alvis Lemmings is calling back, she says she is retuning a call from Dr Serita Kyle nurse.

## 2023-08-26 NOTE — Telephone Encounter (Signed)
Nurse spoke with pt's daughter in law. She was questioning if pt would still be about to proceed with surgery if he goes to the hospital. Nurse informed daughter in law that surgical clearance would be based on hospitalization.   Daughter in law verbalized understanding.

## 2023-08-27 ENCOUNTER — Encounter: Payer: Self-pay | Admitting: Internal Medicine

## 2023-08-27 DIAGNOSIS — Z9049 Acquired absence of other specified parts of digestive tract: Secondary | ICD-10-CM | POA: Diagnosis not present

## 2023-08-27 DIAGNOSIS — Z8673 Personal history of transient ischemic attack (TIA), and cerebral infarction without residual deficits: Secondary | ICD-10-CM | POA: Diagnosis not present

## 2023-08-27 DIAGNOSIS — Z87891 Personal history of nicotine dependence: Secondary | ICD-10-CM | POA: Diagnosis not present

## 2023-08-27 DIAGNOSIS — E860 Dehydration: Secondary | ICD-10-CM | POA: Diagnosis present

## 2023-08-27 DIAGNOSIS — E871 Hypo-osmolality and hyponatremia: Secondary | ICD-10-CM | POA: Diagnosis present

## 2023-08-27 DIAGNOSIS — F32A Depression, unspecified: Secondary | ICD-10-CM | POA: Diagnosis present

## 2023-08-27 DIAGNOSIS — N400 Enlarged prostate without lower urinary tract symptoms: Secondary | ICD-10-CM | POA: Diagnosis present

## 2023-08-27 DIAGNOSIS — Z8249 Family history of ischemic heart disease and other diseases of the circulatory system: Secondary | ICD-10-CM | POA: Diagnosis not present

## 2023-08-27 DIAGNOSIS — M1990 Unspecified osteoarthritis, unspecified site: Secondary | ICD-10-CM | POA: Diagnosis present

## 2023-08-27 DIAGNOSIS — J449 Chronic obstructive pulmonary disease, unspecified: Secondary | ICD-10-CM | POA: Diagnosis present

## 2023-08-27 DIAGNOSIS — F419 Anxiety disorder, unspecified: Secondary | ICD-10-CM | POA: Diagnosis present

## 2023-08-27 DIAGNOSIS — N179 Acute kidney failure, unspecified: Secondary | ICD-10-CM | POA: Diagnosis present

## 2023-08-27 DIAGNOSIS — K219 Gastro-esophageal reflux disease without esophagitis: Secondary | ICD-10-CM | POA: Diagnosis present

## 2023-08-27 DIAGNOSIS — I959 Hypotension, unspecified: Secondary | ICD-10-CM | POA: Diagnosis present

## 2023-08-27 DIAGNOSIS — I2510 Atherosclerotic heart disease of native coronary artery without angina pectoris: Secondary | ICD-10-CM | POA: Diagnosis present

## 2023-08-27 DIAGNOSIS — E878 Other disorders of electrolyte and fluid balance, not elsewhere classified: Secondary | ICD-10-CM | POA: Diagnosis present

## 2023-08-27 DIAGNOSIS — I739 Peripheral vascular disease, unspecified: Secondary | ICD-10-CM | POA: Diagnosis present

## 2023-08-27 DIAGNOSIS — J9611 Chronic respiratory failure with hypoxia: Secondary | ICD-10-CM | POA: Diagnosis present

## 2023-08-27 DIAGNOSIS — Z7984 Long term (current) use of oral hypoglycemic drugs: Secondary | ICD-10-CM | POA: Diagnosis not present

## 2023-08-27 DIAGNOSIS — K760 Fatty (change of) liver, not elsewhere classified: Secondary | ICD-10-CM | POA: Diagnosis present

## 2023-08-27 DIAGNOSIS — D649 Anemia, unspecified: Secondary | ICD-10-CM | POA: Diagnosis present

## 2023-08-27 DIAGNOSIS — N390 Urinary tract infection, site not specified: Secondary | ICD-10-CM | POA: Diagnosis present

## 2023-08-27 DIAGNOSIS — I10 Essential (primary) hypertension: Secondary | ICD-10-CM | POA: Diagnosis present

## 2023-08-27 DIAGNOSIS — E785 Hyperlipidemia, unspecified: Secondary | ICD-10-CM | POA: Diagnosis present

## 2023-08-27 LAB — CBC
HCT: 31.5 % — ABNORMAL LOW (ref 39.0–52.0)
Hemoglobin: 10.4 g/dL — ABNORMAL LOW (ref 13.0–17.0)
MCH: 29.6 pg (ref 26.0–34.0)
MCHC: 33 g/dL (ref 30.0–36.0)
MCV: 89.7 fL (ref 80.0–100.0)
Platelets: 252 10*3/uL (ref 150–400)
RBC: 3.51 MIL/uL — ABNORMAL LOW (ref 4.22–5.81)
RDW: 12.3 % (ref 11.5–15.5)
WBC: 8.7 10*3/uL (ref 4.0–10.5)
nRBC: 0 % (ref 0.0–0.2)

## 2023-08-27 LAB — BASIC METABOLIC PANEL
Anion gap: 7 (ref 5–15)
BUN: 22 mg/dL (ref 8–23)
CO2: 32 mmol/L (ref 22–32)
Calcium: 8.5 mg/dL — ABNORMAL LOW (ref 8.9–10.3)
Chloride: 94 mmol/L — ABNORMAL LOW (ref 98–111)
Creatinine, Ser: 1.81 mg/dL — ABNORMAL HIGH (ref 0.61–1.24)
GFR, Estimated: 40 mL/min — ABNORMAL LOW (ref >60)
Glucose, Bld: 91 mg/dL (ref 70–99)
Potassium: 3.7 mmol/L (ref 3.5–5.1)
Sodium: 133 mmol/L — ABNORMAL LOW (ref 135–145)

## 2023-08-27 LAB — HIV ANTIBODY (ROUTINE TESTING W REFLEX): HIV Screen 4th Generation wRfx: NONREACTIVE

## 2023-08-27 NOTE — Progress Notes (Addendum)
GLUCOSE 104* 121* 91  BUN 22 23 22   CREATININE 2.04* 2.00* 1.81*  CALCIUM 8.9 8.9 8.5*   GFR: Estimated Creatinine Clearance: 47.6 mL/min (A) (by C-G formula based on SCr of 1.81 mg/dL (H)). Liver Function Tests: Recent Labs  Lab 08/26/23 1337  AST 19  ALT 14  ALKPHOS 77  BILITOT 0.8  PROT 7.9  ALBUMIN 3.9   No results for input(s): "LIPASE", "AMYLASE" in the last 168 hours. No results for input(s): "AMMONIA" in the last 168 hours. Coagulation Profile: No results for input(s): "INR", "PROTIME" in the last 168 hours. Cardiac Enzymes: No results for input(s): "CKTOTAL", "CKMB", "CKMBINDEX", "TROPONINI" in the last 168 hours. BNP (last 3  results) No results for input(s): "PROBNP" in the last 8760 hours. HbA1C: No results for input(s): "HGBA1C" in the last 72 hours. CBG: No results for input(s): "GLUCAP" in the last 168 hours. Lipid Profile: No results for input(s): "CHOL", "HDL", "LDLCALC", "TRIG", "CHOLHDL", "LDLDIRECT" in the last 72 hours. Thyroid Function Tests: No results for input(s): "TSH", "T4TOTAL", "FREET4", "T3FREE", "THYROIDAB" in the last 72 hours. Anemia Panel: No results for input(s): "VITAMINB12", "FOLATE", "FERRITIN", "TIBC", "IRON", "RETICCTPCT" in the last 72 hours. Sepsis Labs: Recent Labs  Lab 08/26/23 1337  PROCALCITON <0.10    Recent Results (from the past 240 hour(s))  Culture, blood (Routine X 2) w Reflex to ID Panel     Status: None (Preliminary result)   Collection Time: 08/26/23 10:48 PM   Specimen: BLOOD  Result Value Ref Range Status   Specimen Description BLOOD BLOOD LEFT ARM  Final   Special Requests   Final    BOTTLES DRAWN AEROBIC AND ANAEROBIC Blood Culture adequate volume   Culture   Final    NO GROWTH < 12 HOURS Performed at [facility], [address], [place], [place] [address]    Report Status PENDING  Incomplete  Culture, blood (Routine X 2) w Reflex to ID Panel     Status: None (Preliminary result)   Collection Time: 08/26/23 10:51 PM   Specimen: BLOOD  Result Value Ref Range Status   Specimen Description BLOOD BLOOD RIGHT HAND  Final   Special Requests   Final    BOTTLES DRAWN AEROBIC AND ANAEROBIC Blood Culture adequate volume   Culture   Final    NO GROWTH < 12 HOURS Performed at [facility], [address], [place], [place] [address]    Report Status PENDING  Incomplete         Radiology Studies: DG Chest Port 1 View  Result Date: 08/26/2023 CLINICAL DATA:  Shortness of breath. EXAM: PORTABLE CHEST 1 VIEW COMPARISON:  Chest radiograph 02/26/2021, CT 03/01/2021 FINDINGS: Heart size upper normal. Stable mediastinal contours. Aortic  atherosclerosis. No focal airspace disease, large pleural effusion or pneumothorax. No pulmonary edema. Prior median sternotomy. Density projecting over the left anterior left first rib is likely large spur when compared with CT. IMPRESSION: No acute chest findings. Electronically Signed   By: Narda Rutherford M.D.   On: 08/26/2023 21:54   ECHOCARDIOGRAM COMPLETE  Result Date: 08/26/2023    ECHOCARDIOGRAM REPORT   Patient Name:   Bradley Sawyer Date of Exam: 08/26/2023 Medical Rec #:  [id number]        Height:       71.0 in Accession #:    [id number]       Weight:       211.1 lb Date of Birth:  1955-12-02  GLUCOSE 104* 121* 91  BUN 22 23 22   CREATININE 2.04* 2.00* 1.81*  CALCIUM 8.9 8.9 8.5*   GFR: Estimated Creatinine Clearance: 47.6 mL/min (A) (by C-G formula based on SCr of 1.81 mg/dL (H)). Liver Function Tests: Recent Labs  Lab 08/26/23 1337  AST 19  ALT 14  ALKPHOS 77  BILITOT 0.8  PROT 7.9  ALBUMIN 3.9   No results for input(s): "LIPASE", "AMYLASE" in the last 168 hours. No results for input(s): "AMMONIA" in the last 168 hours. Coagulation Profile: No results for input(s): "INR", "PROTIME" in the last 168 hours. Cardiac Enzymes: No results for input(s): "CKTOTAL", "CKMB", "CKMBINDEX", "TROPONINI" in the last 168 hours. BNP (last 3  results) No results for input(s): "PROBNP" in the last 8760 hours. HbA1C: No results for input(s): "HGBA1C" in the last 72 hours. CBG: No results for input(s): "GLUCAP" in the last 168 hours. Lipid Profile: No results for input(s): "CHOL", "HDL", "LDLCALC", "TRIG", "CHOLHDL", "LDLDIRECT" in the last 72 hours. Thyroid Function Tests: No results for input(s): "TSH", "T4TOTAL", "FREET4", "T3FREE", "THYROIDAB" in the last 72 hours. Anemia Panel: No results for input(s): "VITAMINB12", "FOLATE", "FERRITIN", "TIBC", "IRON", "RETICCTPCT" in the last 72 hours. Sepsis Labs: Recent Labs  Lab 08/26/23 1337  PROCALCITON <0.10    Recent Results (from the past 240 hour(s))  Culture, blood (Routine X 2) w Reflex to ID Panel     Status: None (Preliminary result)   Collection Time: 08/26/23 10:48 PM   Specimen: BLOOD  Result Value Ref Range Status   Specimen Description BLOOD BLOOD LEFT ARM  Final   Special Requests   Final    BOTTLES DRAWN AEROBIC AND ANAEROBIC Blood Culture adequate volume   Culture   Final    NO GROWTH < 12 HOURS Performed at [facility], [address], [place], [place] [address]    Report Status PENDING  Incomplete  Culture, blood (Routine X 2) w Reflex to ID Panel     Status: None (Preliminary result)   Collection Time: 08/26/23 10:51 PM   Specimen: BLOOD  Result Value Ref Range Status   Specimen Description BLOOD BLOOD RIGHT HAND  Final   Special Requests   Final    BOTTLES DRAWN AEROBIC AND ANAEROBIC Blood Culture adequate volume   Culture   Final    NO GROWTH < 12 HOURS Performed at [facility], [address], [place], [place] [address]    Report Status PENDING  Incomplete         Radiology Studies: DG Chest Port 1 View  Result Date: 08/26/2023 CLINICAL DATA:  Shortness of breath. EXAM: PORTABLE CHEST 1 VIEW COMPARISON:  Chest radiograph 02/26/2021, CT 03/01/2021 FINDINGS: Heart size upper normal. Stable mediastinal contours. Aortic  atherosclerosis. No focal airspace disease, large pleural effusion or pneumothorax. No pulmonary edema. Prior median sternotomy. Density projecting over the left anterior left first rib is likely large spur when compared with CT. IMPRESSION: No acute chest findings. Electronically Signed   By: Narda Rutherford M.D.   On: 08/26/2023 21:54   ECHOCARDIOGRAM COMPLETE  Result Date: 08/26/2023    ECHOCARDIOGRAM REPORT   Patient Name:   Bradley Sawyer Date of Exam: 08/26/2023 Medical Rec #:  [id number]        Height:       71.0 in Accession #:    [id number]       Weight:       211.1 lb Date of Birth:  1955-12-02  GLUCOSE 104* 121* 91  BUN 22 23 22   CREATININE 2.04* 2.00* 1.81*  CALCIUM 8.9 8.9 8.5*   GFR: Estimated Creatinine Clearance: 47.6 mL/min (A) (by C-G formula based on SCr of 1.81 mg/dL (H)). Liver Function Tests: Recent Labs  Lab 08/26/23 1337  AST 19  ALT 14  ALKPHOS 77  BILITOT 0.8  PROT 7.9  ALBUMIN 3.9   No results for input(s): "LIPASE", "AMYLASE" in the last 168 hours. No results for input(s): "AMMONIA" in the last 168 hours. Coagulation Profile: No results for input(s): "INR", "PROTIME" in the last 168 hours. Cardiac Enzymes: No results for input(s): "CKTOTAL", "CKMB", "CKMBINDEX", "TROPONINI" in the last 168 hours. BNP (last 3  results) No results for input(s): "PROBNP" in the last 8760 hours. HbA1C: No results for input(s): "HGBA1C" in the last 72 hours. CBG: No results for input(s): "GLUCAP" in the last 168 hours. Lipid Profile: No results for input(s): "CHOL", "HDL", "LDLCALC", "TRIG", "CHOLHDL", "LDLDIRECT" in the last 72 hours. Thyroid Function Tests: No results for input(s): "TSH", "T4TOTAL", "FREET4", "T3FREE", "THYROIDAB" in the last 72 hours. Anemia Panel: No results for input(s): "VITAMINB12", "FOLATE", "FERRITIN", "TIBC", "IRON", "RETICCTPCT" in the last 72 hours. Sepsis Labs: Recent Labs  Lab 08/26/23 1337  PROCALCITON <0.10    Recent Results (from the past 240 hour(s))  Culture, blood (Routine X 2) w Reflex to ID Panel     Status: None (Preliminary result)   Collection Time: 08/26/23 10:48 PM   Specimen: BLOOD  Result Value Ref Range Status   Specimen Description BLOOD BLOOD LEFT ARM  Final   Special Requests   Final    BOTTLES DRAWN AEROBIC AND ANAEROBIC Blood Culture adequate volume   Culture   Final    NO GROWTH < 12 HOURS Performed at [facility], [address], [place], [place] [address]    Report Status PENDING  Incomplete  Culture, blood (Routine X 2) w Reflex to ID Panel     Status: None (Preliminary result)   Collection Time: 08/26/23 10:51 PM   Specimen: BLOOD  Result Value Ref Range Status   Specimen Description BLOOD BLOOD RIGHT HAND  Final   Special Requests   Final    BOTTLES DRAWN AEROBIC AND ANAEROBIC Blood Culture adequate volume   Culture   Final    NO GROWTH < 12 HOURS Performed at [facility], [address], [place], [place] [address]    Report Status PENDING  Incomplete         Radiology Studies: DG Chest Port 1 View  Result Date: 08/26/2023 CLINICAL DATA:  Shortness of breath. EXAM: PORTABLE CHEST 1 VIEW COMPARISON:  Chest radiograph 02/26/2021, CT 03/01/2021 FINDINGS: Heart size upper normal. Stable mediastinal contours. Aortic  atherosclerosis. No focal airspace disease, large pleural effusion or pneumothorax. No pulmonary edema. Prior median sternotomy. Density projecting over the left anterior left first rib is likely large spur when compared with CT. IMPRESSION: No acute chest findings. Electronically Signed   By: Narda Rutherford M.D.   On: 08/26/2023 21:54   ECHOCARDIOGRAM COMPLETE  Result Date: 08/26/2023    ECHOCARDIOGRAM REPORT   Patient Name:   Bradley Sawyer Date of Exam: 08/26/2023 Medical Rec #:  [id number]        Height:       71.0 in Accession #:    [id number]       Weight:       211.1 lb Date of Birth:  1955-12-02  PROGRESS NOTE    Bradley Sawyer  GEX:[id number] DOB: 1956-08-21 DOA: 08/26/2023 PCP: [name], MD   Assessment & Plan:   Principal Problem:   AKI (acute kidney injury) (HCC) Active Problems:   UTI (urinary tract infection)   Hypotension   GERD   COPD (chronic obstructive pulmonary disease) (HCC)   Chronic respiratory failure with hypoxia (HCC)   Hyperlipemia   Essential hypertension   CAD (coronary artery disease)   PAD (peripheral artery disease) (HCC)   History of CVA (cerebrovascular accident)   BPH (benign prostatic hyperplasia)   Depression with anxiety  Assessment and Plan: AKI: Likely due to multifactorial etiology, including dehydration and continuation of diuretics and Prinzide, UTI.  ATN is also possible given hypotension. Continue on IVFs   Possible UTI: continue on IV rocephin. Urine cx is pending    Hypotension: holding lasix, aldactone, imdur, prinzide. Continue on IVFs   GERD: continue on PPI    COPD: w/o exacerbation. Bronchodilators prn. Encourage incentive spirometry    Normocytic anemia: no need for a transfusion currently   Hyponatremia: improving w/ IVFs   HLD: continue on statin    HTN: holding BP meds as BP is on the low end of normal    CAD: continue on statin, plavix, aspirin    PAD: continue on aspirin, plavix, statin    Hx of CVA: continue on aspirin, statin, plavix   BPH: continue on flomax    Depression: severity unknown. Continue on home dose of fluoxetine          DVT prophylaxis: lovenox Code Status: full  Family Communication: discussed pt's care w/ pt's family, [name], and answered her questions  Disposition Plan: severity of illness, requiring IV abxs & IVFs.   Level of care: Telemetry Medical Consultants:    Procedures:   Antimicrobials: rocephin    Subjective: Pt c/o fatigue   Objective: Vitals:   08/26/23 2200 08/27/23 0324 08/27/23 0404 08/27/23 0734  BP:  (!) 104/55  112/76  Pulse:  62   (!) 55  Resp:  20  16  Temp:  98.2 F (36.8 C)  98.3 F (36.8 C)  TempSrc:  Oral  Oral  SpO2:  98%  100%  Weight: 98.7 kg  99.2 kg   Height: 5\' 11"  (1.803 m)       Intake/Output Summary (Last 24 hours) at 08/27/2023 0829 Last data filed at 08/27/2023 0532 Gross per 24 hour  Intake 1280.31 ml  Output 1000 ml  Net 280.31 ml   Filed Weights   08/26/23 2200 08/27/23 0404  Weight: 98.7 kg 99.2 kg    Examination:  General exam: Appears calm and comfortable  Respiratory system: Clear to auscultation. Respiratory effort normal. Cardiovascular system: S1 & S2 +. No  rubs, gallops or clicks.  Gastrointestinal system: Abdomen is nondistended, soft and nontender.  Normal bowel sounds heard. Central nervous system: Alert and awake. Moves all extremities  Psychiatry: Judgement and insight appear normal. Mood & affect appropriate.     Data Reviewed: I have personally reviewed following labs and imaging studies  CBC: Recent Labs  Lab 08/26/23 1337 08/26/23 1627 08/27/23 0327  WBC 12.0* 11.6* 8.7  HGB 11.2* 11.1* 10.4*  HCT 32.6* 32.7* 31.5*  MCV 86.9 89.3 89.7  PLT 278 272 252   Basic Metabolic Panel: Recent Labs  Lab 08/26/23 1337 08/26/23 1627 08/27/23 0327  NA 130* 130* 133*  K 3.6 3.5 3.7  CL 87* 88* 94*  CO2 29 29 32

## 2023-08-28 DIAGNOSIS — N179 Acute kidney failure, unspecified: Secondary | ICD-10-CM | POA: Diagnosis not present

## 2023-08-28 LAB — CBC
HCT: 30 % — ABNORMAL LOW (ref 39.0–52.0)
Hemoglobin: 10.1 g/dL — ABNORMAL LOW (ref 13.0–17.0)
MCH: 29.9 pg (ref 26.0–34.0)
MCHC: 33.7 g/dL (ref 30.0–36.0)
MCV: 88.8 fL (ref 80.0–100.0)
Platelets: 222 10*3/uL (ref 150–400)
RBC: 3.38 MIL/uL — ABNORMAL LOW (ref 4.22–5.81)
RDW: 12.4 % (ref 11.5–15.5)
WBC: 9.5 10*3/uL (ref 4.0–10.5)
nRBC: 0 % (ref 0.0–0.2)

## 2023-08-28 LAB — BASIC METABOLIC PANEL
Anion gap: 9 (ref 5–15)
BUN: 14 mg/dL (ref 8–23)
CO2: 27 mmol/L (ref 22–32)
Calcium: 8.3 mg/dL — ABNORMAL LOW (ref 8.9–10.3)
Chloride: 98 mmol/L (ref 98–111)
Creatinine, Ser: 1.15 mg/dL (ref 0.61–1.24)
GFR, Estimated: >60 mL/min (ref >60)
Glucose, Bld: 114 mg/dL — ABNORMAL HIGH (ref 70–99)
Potassium: 3.9 mmol/L (ref 3.5–5.1)
Sodium: 134 mmol/L — ABNORMAL LOW (ref 135–145)

## 2023-08-28 MED ORDER — CEFDINIR 300 MG PO CAPS: 300.0000 mg | ORAL_CAPSULE | Freq: Two times a day (BID) | ORAL | 0 refills | Status: AC

## 2023-08-28 NOTE — TOC CM/SW Note (Signed)
Transition of Care [facility]) - Inpatient Brief Assessment   Patient Details  Name: Bradley Sawyer MRN: [id number] Date of Birth: 15-Dec-1955  [facility] CM/SW Contact:    Liliana Cline, LCSW Phone Number: 08/28/2023, 8:50 AM  Transition of Care Asessment: Insurance and Status: Insurance coverage has been reviewed Patient has primary care physician: Yes     Prior/Current Home Services: No current home services Social Determinants of Health Reivew: SDOH reviewed no interventions necessary Readmission risk has been reviewed: Yes Transition of care needs: no transition of care needs at this time

## 2023-08-28 NOTE — Discharge Summary (Signed)
Physician Discharge Summary  Bradley Sawyer ZOX:[id number] DOB: [birth year] DOA: 08/26/2023  PCP: [name], MD  Admit date: 08/26/2023 Discharge date: 08/28/2023  Admitted From: home  Disposition:  home   Recommendations for Outpatient Follow-up:  Follow up with PCP in 1-2 weeks   Home Health: no Equipment/Devices:  Discharge Condition: stable  CODE STATUS: full  Diet recommendation: Heart Healthy  Brief/Interim Summary: HPI was taken from Dr. Clyde Lundborg: Bradley Sawyer is a 67 y.o. male with medical history significant of GERD, CAD, PAD, carotid artery stenosis, right subclavian artery stenosis s/p endarterectomy, HTN, HLD, COPD on 3-4 L of home O2, stroke, BPH, depression with anxiety, who presents with abnormal lab.   Per pt and his daughter-in-law at the bedside, patient was recently seen by pulmonology for epigastric pain and shortness of breath.  He is found to have significant GERD on a swallow study and is scheduled for a endoscopy next week Tuesday. He was seen in the cardiology Dr. Serita Kyle office today for preoperative clearance. Pt was found to have abnormal lab with worsening renal function, low blood pressure, mild hyponatremia with sodium 130 and mild leukocytosis with WBC 12.0. He also had 2D echo today which showed EF 60 to 65%.  His blood pressure is 85/60 in ED which improved to 100/70 after giving 1 L normal saline. His sodium is 130 and WBC 11.6 in ED.   Patient reports acid reflux symptoms, nausea and dry heaves, poor appetite and decreased oral intake, but denies abdominal pain and diarrhea to me.  He has SOB and cough with thick white mucus production.  Denies chest pain.  No fever or chills.  No symptoms of UTI.  Of note, patient said taking Lasix and spironolactone, but denies history of CHF.  Patient is taking metformin, but does not know why he is on this medication.  His recent A1c was 6.3 on 07/12/2023, indicating possible prediabetes.     Data reviewed  independently and ED Course: pt was found to have WBC 11.6, procalcitonin < 0.10, AKI with creatinine 2.0, BUN 23, GFR 36 (recent creatinine 1.05 on 01/26/2023), positive urinalysis (hazy appearance, moderate blood leukocyte, rare bacteria, WBC 21-50). Ttemperature normal, heart rate of 102, 57, RR 18, oxygen saturation 91-98% on 3 L oxygen.  Chest x-ray negative.  Patient is placed on telemetry bed for observation.  Discharge Diagnoses:  Principal Problem:   AKI (acute kidney injury) (HCC) Active Problems:   UTI (urinary tract infection)   Hypotension   GERD   COPD (chronic obstructive pulmonary disease) (HCC)   Chronic respiratory failure with hypoxia (HCC)   Hyperlipemia   Essential hypertension   CAD (coronary artery disease)   PAD (peripheral artery disease) (HCC)   History of CVA (cerebrovascular accident)   BPH (benign prostatic hyperplasia)   Depression with anxiety  AKI: Likely due to multifactorial etiology, including dehydration and continuation of diuretics and Prinzide, UTI.  ATN is also possible given hypotension. Cr is trending down again today   UTI: continue on IV rocephin while inpatient & d/c home of po cefdinir to complete the course. Urine cx is growing gram neg rods, sens pending    Hypotension: resolved    GERD: continue on PPI    COPD: w/o exacerbation. Bronchodilators prn. Encourage incentive spirometry    Normocytic anemia: no need for a transfusion currently   Hyponatremia: almost WNL   HLD: continue on statin    HTN: holding BP meds as BP is on the low  Physician Discharge Summary  Bradley Sawyer ZOX:[id number] DOB: [birth year] DOA: 08/26/2023  PCP: [name], MD  Admit date: 08/26/2023 Discharge date: 08/28/2023  Admitted From: home  Disposition:  home   Recommendations for Outpatient Follow-up:  Follow up with PCP in 1-2 weeks   Home Health: no Equipment/Devices:  Discharge Condition: stable  CODE STATUS: full  Diet recommendation: Heart Healthy  Brief/Interim Summary: HPI was taken from Dr. Clyde Lundborg: Bradley Sawyer is a 67 y.o. male with medical history significant of GERD, CAD, PAD, carotid artery stenosis, right subclavian artery stenosis s/p endarterectomy, HTN, HLD, COPD on 3-4 L of home O2, stroke, BPH, depression with anxiety, who presents with abnormal lab.   Per pt and his daughter-in-law at the bedside, patient was recently seen by pulmonology for epigastric pain and shortness of breath.  He is found to have significant GERD on a swallow study and is scheduled for a endoscopy next week Tuesday. He was seen in the cardiology Dr. Serita Kyle office today for preoperative clearance. Pt was found to have abnormal lab with worsening renal function, low blood pressure, mild hyponatremia with sodium 130 and mild leukocytosis with WBC 12.0. He also had 2D echo today which showed EF 60 to 65%.  His blood pressure is 85/60 in ED which improved to 100/70 after giving 1 L normal saline. His sodium is 130 and WBC 11.6 in ED.   Patient reports acid reflux symptoms, nausea and dry heaves, poor appetite and decreased oral intake, but denies abdominal pain and diarrhea to me.  He has SOB and cough with thick white mucus production.  Denies chest pain.  No fever or chills.  No symptoms of UTI.  Of note, patient said taking Lasix and spironolactone, but denies history of CHF.  Patient is taking metformin, but does not know why he is on this medication.  His recent A1c was 6.3 on 07/12/2023, indicating possible prediabetes.     Data reviewed  independently and ED Course: pt was found to have WBC 11.6, procalcitonin < 0.10, AKI with creatinine 2.0, BUN 23, GFR 36 (recent creatinine 1.05 on 01/26/2023), positive urinalysis (hazy appearance, moderate blood leukocyte, rare bacteria, WBC 21-50). Ttemperature normal, heart rate of 102, 57, RR 18, oxygen saturation 91-98% on 3 L oxygen.  Chest x-ray negative.  Patient is placed on telemetry bed for observation.  Discharge Diagnoses:  Principal Problem:   AKI (acute kidney injury) (HCC) Active Problems:   UTI (urinary tract infection)   Hypotension   GERD   COPD (chronic obstructive pulmonary disease) (HCC)   Chronic respiratory failure with hypoxia (HCC)   Hyperlipemia   Essential hypertension   CAD (coronary artery disease)   PAD (peripheral artery disease) (HCC)   History of CVA (cerebrovascular accident)   BPH (benign prostatic hyperplasia)   Depression with anxiety  AKI: Likely due to multifactorial etiology, including dehydration and continuation of diuretics and Prinzide, UTI.  ATN is also possible given hypotension. Cr is trending down again today   UTI: continue on IV rocephin while inpatient & d/c home of po cefdinir to complete the course. Urine cx is growing gram neg rods, sens pending    Hypotension: resolved    GERD: continue on PPI    COPD: w/o exacerbation. Bronchodilators prn. Encourage incentive spirometry    Normocytic anemia: no need for a transfusion currently   Hyponatremia: almost WNL   HLD: continue on statin    HTN: holding BP meds as BP is on the low  pregabalin 150 MG capsule Commonly known as: LYRICA Take 150 mg by mouth daily.   rOPINIRole 1 MG tablet Commonly known as: REQUIP Take 1 tablet by mouth at bedtime.   spironolactone 25 MG tablet Commonly known as: ALDACTONE TAKE ONE TABLET BY MOUTH ONCE DAILY   tamsulosin 0.4 MG Caps capsule Commonly known as: FLOMAX TAKE ONE CAPSULE BY MOUTH EVERY DAY        Allergies  Allergen Reactions   Bee Venom Swelling    Consultations:    Procedures/Studies: DG Chest Port 1 View  Result Date: 08/26/2023 CLINICAL DATA:  Shortness of breath. EXAM: PORTABLE CHEST 1 VIEW COMPARISON:  Chest radiograph 02/26/2021, CT 03/01/2021 FINDINGS: Heart size upper normal. Stable mediastinal contours. Aortic atherosclerosis. No focal airspace disease, large pleural effusion or pneumothorax. No pulmonary edema. Prior median sternotomy. Density projecting over the left anterior left first rib is likely large spur when compared with CT. IMPRESSION: No acute chest findings. Electronically Signed   By: Narda Rutherford M.D.   On: 08/26/2023 21:54    ECHOCARDIOGRAM COMPLETE  Result Date: 08/26/2023    ECHOCARDIOGRAM REPORT   Patient Name:   Bradley Sawyer Date of Exam: 08/26/2023 Medical Rec #:  [id number]        Height:       71.0 in Accession #:    [id number]       Weight:       211.1 lb Date of Birth:  1956/05/20        BSA:          2.158 m Patient Age:    67 years         BP:           100/64 mmHg Patient Gender: M                HR:           98 bpm. Exam Location:  [facility] Procedure: 2D Echo, Cardiac Doppler, Color Doppler and Intracardiac            Opacification Agent Indications:    R06.02 SOB  History:        Patient has prior history of Echocardiogram examinations, most                 recent 10/19/2021. CHF, CAD, COPD, PAD and Stroke,                 Signs/Symptoms:Shortness of Breath; Risk Factors:Hypertension,                 Dyslipidemia and Former Smoker.  Sonographer:    Quentin Ore RDMS, RVT, RDCS Referring Phys: [id number] [address]RI[address] END  Sonographer Comments: Image acquisition challenging due to COPD. IMPRESSIONS  1. Left ventricular ejection fraction, by estimation, is 60 to 65%. The left ventricle has normal function. The left ventricle has no regional wall motion abnormalities. Left ventricular diastolic parameters are indeterminate.  2. Right ventricular systolic function is normal. The right ventricular size is normal.  3. The mitral valve is normal in structure. No evidence of mitral valve regurgitation.  4. The aortic valve was not well visualized. Aortic valve regurgitation is not visualized. FINDINGS  Left Ventricle: Left ventricular ejection fraction, by estimation, is 60 to 65%. The left ventricle has normal function. The left ventricle has no regional wall motion abnormalities. Definity contrast agent was given IV to delineate the left ventricular  endocardial borders. The left ventricular internal cavity size was normal in size. There  pregabalin 150 MG capsule Commonly known as: LYRICA Take 150 mg by mouth daily.   rOPINIRole 1 MG tablet Commonly known as: REQUIP Take 1 tablet by mouth at bedtime.   spironolactone 25 MG tablet Commonly known as: ALDACTONE TAKE ONE TABLET BY MOUTH ONCE DAILY   tamsulosin 0.4 MG Caps capsule Commonly known as: FLOMAX TAKE ONE CAPSULE BY MOUTH EVERY DAY        Allergies  Allergen Reactions   Bee Venom Swelling    Consultations:    Procedures/Studies: DG Chest Port 1 View  Result Date: 08/26/2023 CLINICAL DATA:  Shortness of breath. EXAM: PORTABLE CHEST 1 VIEW COMPARISON:  Chest radiograph 02/26/2021, CT 03/01/2021 FINDINGS: Heart size upper normal. Stable mediastinal contours. Aortic atherosclerosis. No focal airspace disease, large pleural effusion or pneumothorax. No pulmonary edema. Prior median sternotomy. Density projecting over the left anterior left first rib is likely large spur when compared with CT. IMPRESSION: No acute chest findings. Electronically Signed   By: Narda Rutherford M.D.   On: 08/26/2023 21:54    ECHOCARDIOGRAM COMPLETE  Result Date: 08/26/2023    ECHOCARDIOGRAM REPORT   Patient Name:   Bradley Sawyer Date of Exam: 08/26/2023 Medical Rec #:  [id number]        Height:       71.0 in Accession #:    [id number]       Weight:       211.1 lb Date of Birth:  1956/05/20        BSA:          2.158 m Patient Age:    67 years         BP:           100/64 mmHg Patient Gender: M                HR:           98 bpm. Exam Location:  [facility] Procedure: 2D Echo, Cardiac Doppler, Color Doppler and Intracardiac            Opacification Agent Indications:    R06.02 SOB  History:        Patient has prior history of Echocardiogram examinations, most                 recent 10/19/2021. CHF, CAD, COPD, PAD and Stroke,                 Signs/Symptoms:Shortness of Breath; Risk Factors:Hypertension,                 Dyslipidemia and Former Smoker.  Sonographer:    Quentin Ore RDMS, RVT, RDCS Referring Phys: [id number] CHRISTOPHER END  Sonographer Comments: Image acquisition challenging due to COPD. IMPRESSIONS  1. Left ventricular ejection fraction, by estimation, is 60 to 65%. The left ventricle has normal function. The left ventricle has no regional wall motion abnormalities. Left ventricular diastolic parameters are indeterminate.  2. Right ventricular systolic function is normal. The right ventricular size is normal.  3. The mitral valve is normal in structure. No evidence of mitral valve regurgitation.  4. The aortic valve was not well visualized. Aortic valve regurgitation is not visualized. FINDINGS  Left Ventricle: Left ventricular ejection fraction, by estimation, is 60 to 65%. The left ventricle has normal function. The left ventricle has no regional wall motion abnormalities. Definity contrast agent was given IV to delineate the left ventricular  endocardial borders. The left ventricular internal cavity size was normal in size. There  Physician Discharge Summary  Bradley Sawyer ZOX:[id number] DOB: [birth year] DOA: 08/26/2023  PCP: [name], MD  Admit date: 08/26/2023 Discharge date: 08/28/2023  Admitted From: home  Disposition:  home   Recommendations for Outpatient Follow-up:  Follow up with PCP in 1-2 weeks   Home Health: no Equipment/Devices:  Discharge Condition: stable  CODE STATUS: full  Diet recommendation: Heart Healthy  Brief/Interim Summary: HPI was taken from Dr. Clyde Lundborg: Bradley Sawyer is a 67 y.o. male with medical history significant of GERD, CAD, PAD, carotid artery stenosis, right subclavian artery stenosis s/p endarterectomy, HTN, HLD, COPD on 3-4 L of home O2, stroke, BPH, depression with anxiety, who presents with abnormal lab.   Per pt and his daughter-in-law at the bedside, patient was recently seen by pulmonology for epigastric pain and shortness of breath.  He is found to have significant GERD on a swallow study and is scheduled for a endoscopy next week Tuesday. He was seen in the cardiology Dr. Serita Kyle office today for preoperative clearance. Pt was found to have abnormal lab with worsening renal function, low blood pressure, mild hyponatremia with sodium 130 and mild leukocytosis with WBC 12.0. He also had 2D echo today which showed EF 60 to 65%.  His blood pressure is 85/60 in ED which improved to 100/70 after giving 1 L normal saline. His sodium is 130 and WBC 11.6 in ED.   Patient reports acid reflux symptoms, nausea and dry heaves, poor appetite and decreased oral intake, but denies abdominal pain and diarrhea to me.  He has SOB and cough with thick white mucus production.  Denies chest pain.  No fever or chills.  No symptoms of UTI.  Of note, patient said taking Lasix and spironolactone, but denies history of CHF.  Patient is taking metformin, but does not know why he is on this medication.  His recent A1c was 6.3 on 07/12/2023, indicating possible prediabetes.     Data reviewed  independently and ED Course: pt was found to have WBC 11.6, procalcitonin < 0.10, AKI with creatinine 2.0, BUN 23, GFR 36 (recent creatinine 1.05 on 01/26/2023), positive urinalysis (hazy appearance, moderate blood leukocyte, rare bacteria, WBC 21-50). Ttemperature normal, heart rate of 102, 57, RR 18, oxygen saturation 91-98% on 3 L oxygen.  Chest x-ray negative.  Patient is placed on telemetry bed for observation.  Discharge Diagnoses:  Principal Problem:   AKI (acute kidney injury) (HCC) Active Problems:   UTI (urinary tract infection)   Hypotension   GERD   COPD (chronic obstructive pulmonary disease) (HCC)   Chronic respiratory failure with hypoxia (HCC)   Hyperlipemia   Essential hypertension   CAD (coronary artery disease)   PAD (peripheral artery disease) (HCC)   History of CVA (cerebrovascular accident)   BPH (benign prostatic hyperplasia)   Depression with anxiety  AKI: Likely due to multifactorial etiology, including dehydration and continuation of diuretics and Prinzide, UTI.  ATN is also possible given hypotension. Cr is trending down again today   UTI: continue on IV rocephin while inpatient & d/c home of po cefdinir to complete the course. Urine cx is growing gram neg rods, sens pending    Hypotension: resolved    GERD: continue on PPI    COPD: w/o exacerbation. Bronchodilators prn. Encourage incentive spirometry    Normocytic anemia: no need for a transfusion currently   Hyponatremia: almost WNL   HLD: continue on statin    HTN: holding BP meds as BP is on the low  Physician Discharge Summary  Bradley Sawyer ZOX:[id number] DOB: [birth year] DOA: 08/26/2023  PCP: [name], MD  Admit date: 08/26/2023 Discharge date: 08/28/2023  Admitted From: home  Disposition:  home   Recommendations for Outpatient Follow-up:  Follow up with PCP in 1-2 weeks   Home Health: no Equipment/Devices:  Discharge Condition: stable  CODE STATUS: full  Diet recommendation: Heart Healthy  Brief/Interim Summary: HPI was taken from Dr. Clyde Lundborg: Bradley Sawyer is a 67 y.o. male with medical history significant of GERD, CAD, PAD, carotid artery stenosis, right subclavian artery stenosis s/p endarterectomy, HTN, HLD, COPD on 3-4 L of home O2, stroke, BPH, depression with anxiety, who presents with abnormal lab.   Per pt and his daughter-in-law at the bedside, patient was recently seen by pulmonology for epigastric pain and shortness of breath.  He is found to have significant GERD on a swallow study and is scheduled for a endoscopy next week Tuesday. He was seen in the cardiology Dr. Serita Kyle office today for preoperative clearance. Pt was found to have abnormal lab with worsening renal function, low blood pressure, mild hyponatremia with sodium 130 and mild leukocytosis with WBC 12.0. He also had 2D echo today which showed EF 60 to 65%.  His blood pressure is 85/60 in ED which improved to 100/70 after giving 1 L normal saline. His sodium is 130 and WBC 11.6 in ED.   Patient reports acid reflux symptoms, nausea and dry heaves, poor appetite and decreased oral intake, but denies abdominal pain and diarrhea to me.  He has SOB and cough with thick white mucus production.  Denies chest pain.  No fever or chills.  No symptoms of UTI.  Of note, patient said taking Lasix and spironolactone, but denies history of CHF.  Patient is taking metformin, but does not know why he is on this medication.  His recent A1c was 6.3 on 07/12/2023, indicating possible prediabetes.     Data reviewed  independently and ED Course: pt was found to have WBC 11.6, procalcitonin < 0.10, AKI with creatinine 2.0, BUN 23, GFR 36 (recent creatinine 1.05 on 01/26/2023), positive urinalysis (hazy appearance, moderate blood leukocyte, rare bacteria, WBC 21-50). Ttemperature normal, heart rate of 102, 57, RR 18, oxygen saturation 91-98% on 3 L oxygen.  Chest x-ray negative.  Patient is placed on telemetry bed for observation.  Discharge Diagnoses:  Principal Problem:   AKI (acute kidney injury) (HCC) Active Problems:   UTI (urinary tract infection)   Hypotension   GERD   COPD (chronic obstructive pulmonary disease) (HCC)   Chronic respiratory failure with hypoxia (HCC)   Hyperlipemia   Essential hypertension   CAD (coronary artery disease)   PAD (peripheral artery disease) (HCC)   History of CVA (cerebrovascular accident)   BPH (benign prostatic hyperplasia)   Depression with anxiety  AKI: Likely due to multifactorial etiology, including dehydration and continuation of diuretics and Prinzide, UTI.  ATN is also possible given hypotension. Cr is trending down again today   UTI: continue on IV rocephin while inpatient & d/c home of po cefdinir to complete the course. Urine cx is growing gram neg rods, sens pending    Hypotension: resolved    GERD: continue on PPI    COPD: w/o exacerbation. Bronchodilators prn. Encourage incentive spirometry    Normocytic anemia: no need for a transfusion currently   Hyponatremia: almost WNL   HLD: continue on statin    HTN: holding BP meds as BP is on the low  Physician Discharge Summary  Bradley Sawyer ZOX:[id number] DOB: [birth year] DOA: 08/26/2023  PCP: [name], MD  Admit date: 08/26/2023 Discharge date: 08/28/2023  Admitted From: home  Disposition:  home   Recommendations for Outpatient Follow-up:  Follow up with PCP in 1-2 weeks   Home Health: no Equipment/Devices:  Discharge Condition: stable  CODE STATUS: full  Diet recommendation: Heart Healthy  Brief/Interim Summary: HPI was taken from Dr. Clyde Lundborg: Bradley Sawyer is a 67 y.o. male with medical history significant of GERD, CAD, PAD, carotid artery stenosis, right subclavian artery stenosis s/p endarterectomy, HTN, HLD, COPD on 3-4 L of home O2, stroke, BPH, depression with anxiety, who presents with abnormal lab.   Per pt and his daughter-in-law at the bedside, patient was recently seen by pulmonology for epigastric pain and shortness of breath.  He is found to have significant GERD on a swallow study and is scheduled for a endoscopy next week Tuesday. He was seen in the cardiology Dr. Serita Kyle office today for preoperative clearance. Pt was found to have abnormal lab with worsening renal function, low blood pressure, mild hyponatremia with sodium 130 and mild leukocytosis with WBC 12.0. He also had 2D echo today which showed EF 60 to 65%.  His blood pressure is 85/60 in ED which improved to 100/70 after giving 1 L normal saline. His sodium is 130 and WBC 11.6 in ED.   Patient reports acid reflux symptoms, nausea and dry heaves, poor appetite and decreased oral intake, but denies abdominal pain and diarrhea to me.  He has SOB and cough with thick white mucus production.  Denies chest pain.  No fever or chills.  No symptoms of UTI.  Of note, patient said taking Lasix and spironolactone, but denies history of CHF.  Patient is taking metformin, but does not know why he is on this medication.  His recent A1c was 6.3 on 07/12/2023, indicating possible prediabetes.     Data reviewed  independently and ED Course: pt was found to have WBC 11.6, procalcitonin < 0.10, AKI with creatinine 2.0, BUN 23, GFR 36 (recent creatinine 1.05 on 01/26/2023), positive urinalysis (hazy appearance, moderate blood leukocyte, rare bacteria, WBC 21-50). Ttemperature normal, heart rate of 102, 57, RR 18, oxygen saturation 91-98% on 3 L oxygen.  Chest x-ray negative.  Patient is placed on telemetry bed for observation.  Discharge Diagnoses:  Principal Problem:   AKI (acute kidney injury) (HCC) Active Problems:   UTI (urinary tract infection)   Hypotension   GERD   COPD (chronic obstructive pulmonary disease) (HCC)   Chronic respiratory failure with hypoxia (HCC)   Hyperlipemia   Essential hypertension   CAD (coronary artery disease)   PAD (peripheral artery disease) (HCC)   History of CVA (cerebrovascular accident)   BPH (benign prostatic hyperplasia)   Depression with anxiety  AKI: Likely due to multifactorial etiology, including dehydration and continuation of diuretics and Prinzide, UTI.  ATN is also possible given hypotension. Cr is trending down again today   UTI: continue on IV rocephin while inpatient & d/c home of po cefdinir to complete the course. Urine cx is growing gram neg rods, sens pending    Hypotension: resolved    GERD: continue on PPI    COPD: w/o exacerbation. Bronchodilators prn. Encourage incentive spirometry    Normocytic anemia: no need for a transfusion currently   Hyponatremia: almost WNL   HLD: continue on statin    HTN: holding BP meds as BP is on the low

## 2023-08-28 NOTE — Plan of Care (Signed)
  Problem: Health Behavior/Discharge Planning: Goal: Ability to manage health-related needs will improve Outcome: Progressing   Problem: Education: Goal: Knowledge of General Education information will improve Description: Including pain rating scale, medication(s)/side effects and non-pharmacologic comfort measures Outcome: Progressing   Problem: Clinical Measurements: Goal: Ability to maintain clinical measurements within normal limits will improve Outcome: Progressing   

## 2023-08-28 NOTE — Plan of Care (Signed)

## 2023-08-29 ENCOUNTER — Telehealth: Payer: Self-pay | Admitting: Internal Medicine

## 2023-08-29 ENCOUNTER — Telehealth: Payer: Self-pay

## 2023-08-29 LAB — URINE CULTURE: Culture: >=100000 — AB

## 2023-08-29 MED ORDER — PEG 3350-KCL-NA BICARB-NACL 420 G PO SOLR: 4000.0000 mL | Freq: Once | ORAL | 0 refills | Status: AC

## 2023-08-29 NOTE — Addendum Note (Signed)
Addended by: Roena Malady on: 08/29/2023 11:30 AM   Modules accepted: Orders

## 2023-08-29 NOTE — Telephone Encounter (Signed)
Per Alvis Lemmings pt was hospitalized due to kidney function levels. Pt was released yesterday . Pt would like to know if it's ok to still have procedure done tomorrow. Pt wants to make sure it's still ok to drink prep.  [phone]

## 2023-08-29 NOTE — Telephone Encounter (Signed)
Preoperative cardiovascular risk assessment: Bradley Sawyer has poor functional status and complains of chronic dyspnea and chest discomfort.  I do not think he would tolerate a pharmacologic myocardial perfusion stress test on account of his obstructive lung disease.  Cardiac catheterization just over 2 years ago showed minimal CAD.  As above, we will obtain an echocardiogram and labs.  If these are reassuring, I think Bradley Sawyer can proceed with his endoscopic procedures, with our low risk from a cardiovascular standpoint, without additional cardiac testing or intervention.

## 2023-08-29 NOTE — Telephone Encounter (Signed)
Spoke to patient's daughter-in-law and informed her of Dr. Serita Kyle recommendations as follows:  "It seems appropriate to hold some of Mr. Dicenzo BP medications if his BP remains low (it was in the normal range yesterday leading up to his discharge).  If further management is needed, I suggest that Mr. Boroff reach out to his PCP or his primary cardiologist, Dr. Welton Flakes, for further recommendations.  In the meantime, he should hold his lisinopril-hydrochlorothiazide until receiving further instructions from one of the aforementioned providers.   Yvonne Kendall, MD [facility]"  [name] understood with read back and stated she thinks Dr. Okey Dupre should become is primary cardiologist as it makes everything so much easier.

## 2023-08-29 NOTE — Telephone Encounter (Signed)
   [name] is calling to follow up, she said, pt stayed at the hospital this weekend and he would like to ask Dr. Okey Dupre if pt needs to change his BP medications. She said, to call her at [phone]

## 2023-08-29 NOTE — Telephone Encounter (Signed)
It seems appropriate to hold some of Bradley Sawyer BP medications if his BP remains low (it was in the normal range yesterday leading up to his discharge).  If further management is needed, I suggest that Bradley Sawyer reach out to his PCP or his primary cardiologist, Dr. Welton Flakes, for further recommendations.  In the meantime, he should hold his lisinopril-hydrochlorothiazide until receiving further instructions from one of the aforementioned providers.  Yvonne Kendall, MD [facility]

## 2023-08-29 NOTE — Telephone Encounter (Signed)
Patient daughter in law [facility]) called in wanting to speak to melanie about the patient procedure tomorrow. The patient was hospitalized due to kidney function levels. He was released yesterday. She just picked up the prep and wanted to know if it is okay for him to drink. Patient daughter in law said that she is willing to speak with a nurse that can help. I did advise her that Shawna Orleans will contact her when she get in the office.

## 2023-08-29 NOTE — Telephone Encounter (Signed)
Due to recent ED visit and decreased kidney function and other health issues, it is best that pt r/s upcoming procedure.... Pt moved to 10/23 and new Rx for Golytely sent to pharmacy... Spoke to [place], daughter, she will pick up new instructions tomorrow for pt

## 2023-09-01 LAB — CULTURE, BLOOD (ROUTINE X 2)
Culture: NO GROWTH
Culture: NO GROWTH
Special Requests: ADEQUATE
Special Requests: ADEQUATE

## 2023-09-05 ENCOUNTER — Other Ambulatory Visit: Payer: Self-pay

## 2023-09-05 ENCOUNTER — Other Ambulatory Visit: Payer: Self-pay | Admitting: Cardiovascular Disease

## 2023-09-05 ENCOUNTER — Other Ambulatory Visit (HOSPITAL_COMMUNITY): Payer: Self-pay

## 2023-09-05 DIAGNOSIS — K2100 Gastro-esophageal reflux disease with esophagitis, without bleeding: Secondary | ICD-10-CM

## 2023-09-05 DIAGNOSIS — I2510 Atherosclerotic heart disease of native coronary artery without angina pectoris: Secondary | ICD-10-CM

## 2023-09-05 DIAGNOSIS — I10 Essential (primary) hypertension: Secondary | ICD-10-CM

## 2023-09-05 NOTE — Progress Notes (Signed)
Specialty Pharmacy Refill Coordination Note  Bradley Sawyer is a 67 y.o. male contacted today regarding refills of specialty medication(s) Dupilumab   Patient requested Delivery   Delivery date: 09/09/23   Verified address: [address], [place], [address]   Medication will be filled on 09/08/23.

## 2023-09-07 ENCOUNTER — Telehealth (HOSPITAL_BASED_OUTPATIENT_CLINIC_OR_DEPARTMENT_OTHER): Payer: Self-pay | Admitting: *Deleted

## 2023-09-07 NOTE — Telephone Encounter (Signed)
Patient Name: Bradley Sawyer  DOB: Jun 28, 1956 MRN: [id number]  Primary Cardiologist: None  Chart reviewed as part of pre-operative protocol coverage. Pre-op clearance already addressed by colleagues in earlier phone notes. To summarize recommendations:  -Preoperative cardiovascular risk assessment: Bradley Sawyer has poor functional status and complains of chronic dyspnea and chest discomfort.  I do not think he would tolerate a pharmacologic myocardial perfusion stress test on account of his obstructive lung disease.  Cardiac catheterization just over 2 years ago showed minimal CAD.  As above, we will obtain an echocardiogram and labs.  If these are reassuring, I think Bradley Sawyer can proceed with his endoscopic procedures, with our low risk from a cardiovascular standpoint, without additional cardiac testing or intervention. -Dr. Okey Dupre  From his echo report 10/4:  Please let Bradley Sawyer know that his labs show significant abnormalities including kidney dysfunction that is new since his last labs in March as well as a low sodium level.  He also has a mildly elevated white blood cell count, which could be seen in the setting of an infection.  His echocardiogram, fortunately shows that his heart is contracting well and that he does not have any significant heart valve problems.  Given his low blood pressure readings in the office today, his progressive shortness of breath and chest pain, and the aforementioned lab abnormalities, I recommend that Bradley Sawyer go to the nearest ER for further evaluation of this.  I will forward the results to Bradley Sawyer, his gastroenterologist, as well given upcoming endoscopies that may need to be rescheduled. -Dr. Okey Dupre   Will route this bundled recommendation to requesting provider via Epic fax function and remove from pre-op pool. Please call with questions.  Sharlene Dory, PA-C 09/07/2023, 2:56 PM

## 2023-09-07 NOTE — Telephone Encounter (Signed)
Pre-operative Risk Assessment    Patient Name: Bradley Sawyer  DOB: 1956-01-21 MRN: [id number]      Request for Surgical Clearance    Procedure:   Colonoscopy/EGD   Date of Surgery:  Clearance 09/14/23                                 Surgeon:  Not Indicated Surgeon's Group or Practice Name:  [facility] Phone number:  [phone] Fax number:  [phone]   Type of Clearance Requested:   - Medical  - Pharmacy:  Hold Aspirin and Clopidogrel (Plavix) Not Indicated.   Type of Anesthesia:  Not Indicated   Additional requests/questions:  Patient's last office visit was 08/26/2023 with Dr. Okey Dupre.   Signed, Emmit Pomfret   09/07/2023, 2:11 PM

## 2023-09-13 ENCOUNTER — Ambulatory Visit: Payer: Medicare HMO | Attending: Pulmonary Disease

## 2023-09-14 ENCOUNTER — Ambulatory Visit
Admission: RE | Admit: 2023-09-14 | Discharge: 2023-09-14 | Disposition: A | Discharge status: Home / Self Care | Payer: Medicare HMO | Attending: Gastroenterology | Admitting: Gastroenterology

## 2023-09-14 ENCOUNTER — Encounter: Payer: Self-pay | Admitting: Gastroenterology

## 2023-09-14 ENCOUNTER — Ambulatory Visit: Payer: Medicare HMO | Admitting: Anesthesiology

## 2023-09-14 ENCOUNTER — Encounter
Admission: RE | Disposition: A | Discharge status: Home / Self Care | Payer: Self-pay | Source: Home / Self Care | Attending: Gastroenterology

## 2023-09-14 DIAGNOSIS — Z1211 Encounter for screening for malignant neoplasm of colon: Secondary | ICD-10-CM | POA: Diagnosis present

## 2023-09-14 DIAGNOSIS — I10 Essential (primary) hypertension: Secondary | ICD-10-CM | POA: Insufficient documentation

## 2023-09-14 DIAGNOSIS — K640 First degree hemorrhoids: Secondary | ICD-10-CM

## 2023-09-14 DIAGNOSIS — E785 Hyperlipidemia, unspecified: Secondary | ICD-10-CM | POA: Insufficient documentation

## 2023-09-14 DIAGNOSIS — R0600 Dyspnea, unspecified: Secondary | ICD-10-CM | POA: Insufficient documentation

## 2023-09-14 DIAGNOSIS — K760 Fatty (change of) liver, not elsewhere classified: Secondary | ICD-10-CM | POA: Insufficient documentation

## 2023-09-14 DIAGNOSIS — F419 Anxiety disorder, unspecified: Secondary | ICD-10-CM | POA: Insufficient documentation

## 2023-09-14 DIAGNOSIS — D124 Benign neoplasm of descending colon: Secondary | ICD-10-CM | POA: Diagnosis not present

## 2023-09-14 DIAGNOSIS — Z87891 Personal history of nicotine dependence: Secondary | ICD-10-CM | POA: Insufficient documentation

## 2023-09-14 DIAGNOSIS — I739 Peripheral vascular disease, unspecified: Secondary | ICD-10-CM | POA: Diagnosis not present

## 2023-09-14 DIAGNOSIS — K219 Gastro-esophageal reflux disease without esophagitis: Secondary | ICD-10-CM | POA: Diagnosis not present

## 2023-09-14 DIAGNOSIS — Z860100 Personal history of colon polyps, unspecified: Secondary | ICD-10-CM | POA: Diagnosis not present

## 2023-09-14 DIAGNOSIS — I25119 Atherosclerotic heart disease of native coronary artery with unspecified angina pectoris: Secondary | ICD-10-CM | POA: Diagnosis not present

## 2023-09-14 DIAGNOSIS — K3189 Other diseases of stomach and duodenum: Secondary | ICD-10-CM

## 2023-09-14 DIAGNOSIS — R12 Heartburn: Secondary | ICD-10-CM | POA: Diagnosis not present

## 2023-09-14 DIAGNOSIS — Z9981 Dependence on supplemental oxygen: Secondary | ICD-10-CM | POA: Insufficient documentation

## 2023-09-14 DIAGNOSIS — K621 Rectal polyp: Secondary | ICD-10-CM

## 2023-09-14 DIAGNOSIS — K5730 Diverticulosis of large intestine without perforation or abscess without bleeding: Secondary | ICD-10-CM | POA: Diagnosis not present

## 2023-09-14 DIAGNOSIS — J449 Chronic obstructive pulmonary disease, unspecified: Secondary | ICD-10-CM | POA: Diagnosis not present

## 2023-09-14 DIAGNOSIS — D122 Benign neoplasm of ascending colon: Secondary | ICD-10-CM | POA: Diagnosis not present

## 2023-09-14 DIAGNOSIS — R0602 Shortness of breath: Secondary | ICD-10-CM | POA: Insufficient documentation

## 2023-09-14 DIAGNOSIS — K259 Gastric ulcer, unspecified as acute or chronic, without hemorrhage or perforation: Secondary | ICD-10-CM | POA: Diagnosis not present

## 2023-09-14 DIAGNOSIS — K635 Polyp of colon: Secondary | ICD-10-CM

## 2023-09-14 DIAGNOSIS — D123 Benign neoplasm of transverse colon: Secondary | ICD-10-CM | POA: Diagnosis not present

## 2023-09-14 DIAGNOSIS — Z860101 Personal history of adenomatous and serrated colon polyps: Secondary | ICD-10-CM

## 2023-09-14 HISTORY — PX: COLONOSCOPY WITH PROPOFOL: SHX5780

## 2023-09-14 HISTORY — DX: Dyspnea, unspecified: R06.00

## 2023-09-14 HISTORY — PX: BIOPSY: SHX5522

## 2023-09-14 HISTORY — PX: ESOPHAGOGASTRODUODENOSCOPY (EGD) WITH PROPOFOL: SHX5813

## 2023-09-14 SURGERY — COLONOSCOPY WITH PROPOFOL
Anesthesia: General

## 2023-09-14 MED ORDER — PROPOFOL 500 MG/50ML IV EMUL
INTRAVENOUS | Status: DC | PRN
  Administered 2023-09-14: 100 ug/kg/min via INTRAVENOUS

## 2023-09-14 MED ORDER — LIDOCAINE HCL (CARDIAC) PF 100 MG/5ML IV SOSY
PREFILLED_SYRINGE | INTRAVENOUS | Status: DC | PRN
  Administered 2023-09-14: 100 mg via INTRAVENOUS

## 2023-09-14 MED ORDER — GLYCOPYRROLATE 0.2 MG/ML IJ SOLN
INTRAMUSCULAR | Status: DC | PRN
  Administered 2023-09-14: .2 mg via INTRAVENOUS

## 2023-09-14 MED ORDER — FENTANYL CITRATE (PF) 100 MCG/2ML IJ SOLN
INTRAMUSCULAR | Status: AC
  Filled 2023-09-14: qty 2

## 2023-09-14 MED ORDER — PROPOFOL 10 MG/ML IV BOLUS
INTRAVENOUS | Status: DC | PRN
  Administered 2023-09-14: 100 mg via INTRAVENOUS

## 2023-09-14 MED ORDER — MIDAZOLAM HCL 2 MG/2ML IJ SOLN
INTRAMUSCULAR | Status: AC
  Filled 2023-09-14: qty 2

## 2023-09-14 MED ORDER — PHENYLEPHRINE 80 MCG/ML (10ML) SYRINGE FOR IV PUSH (FOR BLOOD PRESSURE SUPPORT)
PREFILLED_SYRINGE | INTRAVENOUS | Status: DC | PRN
  Administered 2023-09-14 (×3): 160 ug via INTRAVENOUS

## 2023-09-14 MED ORDER — SODIUM CHLORIDE 0.9 % IV SOLN: INTRAVENOUS | Status: DC

## 2023-09-14 NOTE — H&P (Signed)
Bradley Minium, MD [facility] [address], [address] [place], [place] [address] Phone:[phone] Fax : [phone]  Primary Care Physician:  [name], MD Primary Gastroenterologist:  Dr. Servando Snare  Pre-Procedure History & Physical: HPI:  Bradley Sawyer is a 67 y.o. male is here for an endoscopy and colonoscopy.   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   Compression fracture    L2   COPD (chronic obstructive pulmonary disease) (HCC)    bronchitis   CVA (cerebral infarction)    Disorder of vocal cord    Fatty liver    GERD (gastroesophageal reflux disease)    Hx of cardiovascular stress test    ETT/Lexiscan-Myoview (10/15):  Inferior, inferoapical, apical lateral defect c/w mild ischemia, EF 66%; Intermediate Risk   Hyperlipidemia    Hypertension    Right shoulder pain    2/2 partial rotator cuff tear, tendinopathy, mild subacromial/subdeltioid bursitis, A/C joint arthropathy per MRI done in 3/07   Tobacco abuse     Past Surgical History:  Procedure Laterality Date   ANTERIOR LAT LUMBAR FUSION N/A 12/18/2013   Procedure: Lumbar Two Anteriorlateral Corpectomy w/ cage, interbody fusion, anterior plating, Lumbar One to Lumbar Three percutaneous pedicle screws;  Surgeon: Temple Pacini, MD;  Location: [facility] NEURO ORS;  Service: Neurosurgery;  Laterality: N/A;   AORTA -INNOMIATE BYPASS N/A 10/15/2014   Procedure: ENDARTERCTOMY OF  INNOMINATE ARTERY ;  Surgeon: Sherren Kerns, MD;  [facility] OR;  Service: Vascular;  Laterality: N/A;   ARCH AORTOGRAM N/A 08/23/2014   Procedure: ARCH AORTOGRAM;  Surgeon: Sherren Kerns, MD;  Location: [facility] CATH LAB;  Service: Cardiovascular;  Laterality: N/A;   arch aortogram, left carotid,left subclavian angiogram  08/23/2014   CAROTID ANGIOGRAM Left 08/23/2014   Procedure: CAROTID ANGIOGRAM;   Surgeon: Sherren Kerns, MD;  Location: [facility] CATH LAB;  Service: Cardiovascular;  Laterality: Left;   CHOLECYSTECTOMY  12/2016   ENDARTERECTOMY Right 10/15/2014   Procedure: ENDARTERECTOMY RIGHT SUBCLAVIAN ARTERY;  Surgeon: Sherren Kerns, MD;  Location: [facility] OR;  Service: Vascular;  Laterality: Right;   ENDARTERECTOMY Right 10/15/2014   Procedure: ENDARTERECTOMY RIGHT COMMON CAROTID ARTERY;  Surgeon: Sherren Kerns, MD;  Location: [facility] OR;  Service: Vascular;  Laterality: Right;   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   HERNIA REPAIR     LEFT HEART CATH AND CORONARY ANGIOGRAPHY N/A 03/17/2021   Procedure: LEFT HEART CATH AND CORONARY ANGIOGRAPHY;  Surgeon: Laurier Nancy, MD;  Location: [facility] INVASIVE CV LAB;  Service: Cardiovascular;  Laterality: N/A;   LEFT HEART CATHETERIZATION WITH CORONARY ANGIOGRAM N/A 09/12/2014   Procedure: LEFT HEART CATHETERIZATION WITH CORONARY ANGIOGRAM;  Surgeon: Wendall Stade, MD;  Location: [facility] CATH LAB;  Service: Cardiovascular;  Laterality: N/A;   LUMBAR SPINE SURGERY  12/18/2013   L2     DR POOL   NECK SURGERY     PATCH ANGIOPLASTY Right 10/15/2014   Procedure: PATCH ANGIOPLASTY OF RIGHT SUBCLAVIAN ARTERY , RIGHT COMMON CAROTID ARTERY & INNOMINATE ARTERY;  Surgeon: Sherren Kerns, MD;  Location: [facility] OR;  Service: Vascular;  Laterality: Right;   STERNOTOMY N/A 10/15/2014   Procedure: PARTIAL STERNOTOMY & PLATING OF STERNUM;  Surgeon: Sherren Kerns, MD;  Location: [facility] OR;  Service: Vascular;  Laterality: N/A;   TEE WITHOUT  CARDIOVERSION N/A 12/25/2013   Procedure: TRANSESOPHAGEAL ECHOCARDIOGRAM (TEE);  Surgeon: Laurey Morale, MD;  Location: [facility] ENDOSCOPY;  Service: Cardiovascular;  Laterality: N/A;    Prior to Admission medications   Medication Sig Start Date End Date Taking? Authorizing Provider  albuterol (PROVENTIL) (2.5 MG/3ML) 0.083% nebulizer solution USE 1 AMPULE IN NEBULIZER EVERY 6 HOURS AS NEEDED. 06/29/21   Salena Saner, MD   albuterol (VENTOLIN HFA) 108 (90 Base) MCG/ACT inhaler INHALE 2 PUFFS BY MOUTH EVERY 4-6 HOURS AS NEEDED FOR WHEEZING 02/25/23   Salena Saner, MD  aspirin EC 81 MG tablet Take 81 mg by mouth daily.    [provider]  atorvastatin (LIPITOR) 80 MG tablet TAKE ONE TABLET BY MOUTH ONCE DAILY AT BEDTIME 09/05/23   Laurier Nancy, MD  BREZTRI AEROSPHERE 160-9-4.8 MCG/ACT AERO INHALE TWO PUFFS into THE lungs TWICE DAILY morning & bedtime 06/13/23   Salena Saner, MD  clopidogrel (PLAVIX) 75 MG tablet TAKE ONE TABLET BY MOUTH ONCE DAILY 09/05/23   Laurier Nancy, MD  Dupilumab (DUPIXENT) 300 MG/2ML SOPN Inject 300 mg into the skin every 14 (fourteen) days. 08/08/23   Salena Saner, MD  EPINEPHrine 0.3 mg/0.3 mL IJ SOAJ injection Inject 0.3 mg into the muscle as needed for anaphylaxis. 04/04/20   [provider]  famotidine (PEPCID) 40 MG tablet TAKE ONE TABLET BY MOUTH ONCE DAILY 09/05/23   Adrian Blackwater A, MD  FLUoxetine (PROZAC) 20 MG capsule Take 20 mg by mouth daily. 06/01/21   [provider]  furosemide (LASIX) 20 MG tablet TAKE ONE TABLET BY MOUTH EVERY DAY 09/05/23   Adrian Blackwater A, MD  isosorbide mononitrate (IMDUR) 30 MG 24 hr tablet Take 30 mg by mouth daily.    [provider]  levocetirizine (XYZAL) 5 MG tablet Take 5 mg by mouth at bedtime. 07/15/20   [provider]  linaclotide Karlene Einstein) 290 MCG CAPS capsule Take 290 mcg by mouth daily before breakfast. 12/05/19   [provider]  lisinopril-hydrochlorothiazide (ZESTORETIC) 20-25 MG tablet TAKE ONE TABLET BY MOUTH ONCE DAILY 09/05/23   Adrian Blackwater A, MD  metFORMIN (GLUCOPHAGE) 500 MG tablet Take 500 mg by mouth daily. 07/22/20   [provider]  montelukast (SINGULAIR) 10 MG tablet Take 10 mg by mouth daily. 07/15/20   [provider]  Multiple Vitamin (MULTI-VITAMINS) TABS Take 1 tablet by mouth daily.    [provider]  naloxone [facility]) nasal  spray 4 mg/0.1 mL Place 0.4 mg into the nose once. 09/02/16   [provider]  nitroGLYCERIN (NITROSTAT) 0.4 MG SL tablet DISSOLVE (1) TABLET UNDER TONGUE AS NEEDED TO RELIEVE CHEST PAIN. MAY REPEAT EVERY 5 MINUTES. 05/06/23   Laurier Nancy, MD  ondansetron (ZOFRAN) 4 MG tablet Take by mouth. 02/04/22   [provider]  oxyCODONE (ROXICODONE) 15 MG immediate release tablet Take 15 mg by mouth. 10/09/21   [provider]  pantoprazole (PROTONIX) 40 MG tablet Take 1 tablet (40 mg total) by mouth 2 (two) times daily before a meal. 08/16/23   Bradley Minium, MD  polyethylene glycol (MIRALAX / GLYCOLAX) packet Take 17 g by mouth daily as needed (for constipation).    [provider]  pregabalin (LYRICA) 150 MG capsule Take 150 mg by mouth daily. 09/20/19   [provider]  rOPINIRole (REQUIP) 1 MG tablet Take 1 tablet by mouth at bedtime. 06/01/21   [provider]  spironolactone (ALDACTONE) 25 MG tablet TAKE  ONE TABLET BY MOUTH ONCE DAILY 09/05/23   Laurier Nancy, MD  tamsulosin (FLOMAX) 0.4 MG CAPS capsule TAKE ONE CAPSULE BY MOUTH EVERY DAY 03/07/23   Michiel Cowboy A, PA-C    Allergies as of 08/16/2023 - Review Complete 08/16/2023  Allergen Reaction Noted   Bee venom Swelling 04/04/2020    Family History  Problem Relation Age of Onset   Heart disease Father    Heart attack Father    Diabetes Paternal Grandmother        1 st degree relatives   Hypertension Paternal Grandmother    Diabetes Brother    Heart disease Brother    Muscular dystrophy Brother    Muscular dystrophy Sister    Prostate cancer Neg Hx    Kidney cancer Neg Hx    Bladder Cancer Neg Hx     Social History   Socioeconomic History   Marital status: Divorced    Spouse name: Not on file   Number of children: 1   Years of education: GED   Highest education level: Not on file  Occupational History   Occupation: Disabled  Tobacco Use   Smoking status: Former     Current packs/day: 0.00    Average packs/day: 2.0 packs/day for 48.0 years (96.0 ttl pk-yrs)    Types: Cigarettes    Start date: 11/22/1972    Quit date: 11/22/2020    Years since quitting: 2.8   Smokeless tobacco: Never  Vaping Use   Vaping status: Never Used  Substance and Sexual Activity   Alcohol use: No    Alcohol/week: 0.0 standard drinks of alcohol   Drug use: No    Types: Cocaine, Marijuana    Comment: no recent use   Sexual activity: Not on file  Other Topics Concern   Not on file  Social History Narrative   Patient is single with one child.   Patient is right handed.   Patient has his GED.   Patient drinks 5 or more cups daily.   Social Determinants of Health   Financial Resource Strain: Low Risk  (01/26/2023)   Received from [facility], [facility]   Overall Financial Resource Strain (CARDIA)    Difficulty of Paying Living Expenses: Not hard at all  Food Insecurity: No Food Insecurity (08/26/2023)   Hunger Vital Sign    Worried About Running Out of Food in the Last Year: Never true    Ran Out of Food in the Last Year: Never true  Transportation Needs: No Transportation Needs (08/26/2023)   PRAPARE - Administrator, Civil Service (Medical): No    Lack of Transportation (Non-Medical): No  Physical Activity: Unknown (10/27/2022)   Received from [facility], [facility]   Exercise Vital Sign    Days of Exercise per Week: 0 days    Minutes of Exercise per Session: Not on file  Stress: No Stress Concern Present (10/27/2022)   Received from [facility], [facility] - Occupational Stress Questionnaire    Feeling of Stress : Only a little  Social Connections: Unknown (05/10/2023)   Received from [place], [facility]   Social Connections    How often do you feel lonely or isolated from those around you? (Adult - for ages 41 years and over): Not on file  Intimate Partner Violence: Not At Risk (08/26/2023)    Humiliation, Afraid, Rape, and Kick questionnaire    Fear of Current or Ex-Partner: No  Emotionally Abused: No    Physically Abused: No    Sexually Abused: No    Review of Systems: See HPI, otherwise negative ROS  Physical Exam: There were no vitals taken for this visit. General:   Alert,  pleasant and cooperative in NAD Head:  Normocephalic and atraumatic. Neck:  Supple; no masses or thyromegaly. Lungs:  Clear throughout to auscultation.    Heart:  Regular rate and rhythm. Abdomen:  Soft, nontender and nondistended. Normal bowel sounds, without guarding, and without rebound.   Neurologic:  Alert and  oriented x4;  grossly normal neurologically.  Impression/Plan: RUAN ATONDO is here for an endoscopy and colonoscopy to be performed for GERD and a history of adenomatous polyps on 2015   Risks, benefits, limitations, and alternatives regarding  endoscopy and colonoscopy have been reviewed with the patient.  Questions have been answered.  All parties agreeable.   Bradley Minium, MD  09/14/2023, 9:14 AM

## 2023-09-14 NOTE — Op Note (Signed)
[facility] Gastroenterology Patient Name: Bradley Sawyer Procedure Date: 09/14/2023 9:57 AM MRN: [id number] Account #: [id number] Date of Birth: [birth year] Admit Type: Outpatient Age: 67 Room: [facility] ENDO ROOM 4 Gender: Male Note Status: Finalized Instrument Name: Upper Endoscope [id number] Procedure:             Upper GI endoscopy Indications:           Heartburn Providers:             [name] MD, MD Referring MD:          No Local Md, MD (Referring MD) Medicines:             Propofol per Anesthesia Complications:         No immediate complications. Procedure:             Pre-Anesthesia Assessment:                        - Prior to the procedure, a History and Physical was                         performed, and patient medications and allergies were                         reviewed. The patient's tolerance of previous                         anesthesia was also reviewed. The risks and benefits                         of the procedure and the sedation options and risks                         were discussed with the patient. All questions were                         answered, and informed consent was obtained. Prior                         Anticoagulants: The patient has taken no anticoagulant                         or antiplatelet agents. ASA Grade Assessment: II - A                         patient with mild systemic disease. After reviewing                         the risks and benefits, the patient was deemed in                         satisfactory condition to undergo the procedure.                        After obtaining informed consent, the endoscope was                         passed under direct vision. Throughout the procedure,  the patient's blood pressure, pulse, and oxygen                         saturations were monitored continuously. The                         Endosonoscope was introduced through the mouth, and                          advanced to the second part of duodenum. The upper GI                         endoscopy was accomplished without difficulty. The                         patient tolerated the procedure well. Findings:      The examined esophagus was normal.      One non-bleeding cratered gastric ulcer with no stigmata of bleeding was       found in the gastric antrum. The lesion was 4 mm in largest dimension.       Biopsies were taken with a cold forceps for histology.      The examined duodenum was normal. Impression:            - Normal esophagus.                        - Non-bleeding gastric ulcer with no stigmata of                         bleeding. Biopsied.                        - Normal examined duodenum. Recommendation:        - Discharge patient to home.                        - Resume previous diet.                        - Continue present medications.                        - Await pathology results.                        - Repeat upper endoscopy in 6 weeks to check healing. Procedure Code(s):     --- Professional ---                        [phone], Esophagogastroduodenoscopy, flexible,                         transoral; with biopsy, single or multiple Diagnosis Code(s):     --- Professional ---                        R12, Heartburn                        K25.9, Gastric ulcer, unspecified as acute or chronic,  without hemorrhage or perforation CPT copyright 2022 American Medical Association. All rights reserved. The codes documented in this report are preliminary and upon coder review may  be revised to meet current compliance requirements. [name] MD, MD 09/14/2023 10:07:09 AM This report has been signed electronically. Number of Addenda: 0 Note Initiated On: 09/14/2023 9:57 AM Estimated Blood Loss:  Estimated blood loss: none.      [facility]

## 2023-09-14 NOTE — Transfer of Care (Signed)
Immediate Anesthesia Transfer of Care Note  Patient: Bradley Sawyer  Procedure(s) Performed: COLONOSCOPY WITH PROPOFOL ESOPHAGOGASTRODUODENOSCOPY (EGD) WITH PROPOFOL BIOPSY  Patient Location: PACU  Anesthesia Type:General  Level of Consciousness: awake, alert , and oriented  Airway & Oxygen Therapy: Patient Spontanous Breathing and Patient connected to nasal cannula oxygen  Post-op Assessment: Report given to RN, Post -op Vital signs reviewed and stable, and Patient moving all extremities  Post vital signs: Reviewed and stable  Last Vitals:  Vitals Value Taken Time  BP 98/65 09/14/23 1035  Temp    Pulse 69 09/14/23 1036  Resp 14 09/14/23 1036  SpO2 97 % 09/14/23 1036  Vitals shown include unfiled device data.  Last Pain:  Vitals:   09/14/23 0924  TempSrc: Temporal  PainSc: 8          Complications: No notable events documented.

## 2023-09-14 NOTE — Anesthesia Postprocedure Evaluation (Signed)
Anesthesia Post Note  Patient: Bradley Sawyer  Procedure(s) Performed: COLONOSCOPY WITH PROPOFOL ESOPHAGOGASTRODUODENOSCOPY (EGD) WITH PROPOFOL BIOPSY  Patient location during evaluation: PACU Anesthesia Type: General Level of consciousness: awake Pain management: pain level controlled Vital Signs Assessment: post-procedure vital signs reviewed and stable Respiratory status: spontaneous breathing Cardiovascular status: blood pressure returned to baseline Anesthetic complications: no   No notable events documented.   Last Vitals:  Vitals:   09/14/23 1046 09/14/23 1056  BP: 106/78 108/62  Pulse: 63 70  Resp: 13 15  Temp:    SpO2: 99% 95%    Last Pain:  Vitals:   09/14/23 1056  TempSrc:   PainSc: 0-No pain                 VAN STAVEREN,Mikenna Bunkley

## 2023-09-14 NOTE — Anesthesia Preprocedure Evaluation (Signed)
Anesthesia Evaluation  Patient identified by MRN, date of birth, ID band Patient awake    Reviewed: Allergy & Precautions, NPO status , Patient's Chart, lab work & pertinent test results  Airway Mallampati: III  TM Distance: >3 FB Neck ROM: full    Dental  (+) Edentulous Upper, Edentulous Lower   Pulmonary neg pulmonary ROS, shortness of breath, COPD,  COPD inhaler and oxygen dependent, former smoker   Pulmonary exam normal + rhonchi  + decreased breath sounds      Cardiovascular Exercise Tolerance: Poor hypertension, Pt. 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 No date: Dyspnea No date: Fatty liver No date: GERD (gastroesophageal reflux disease) No date: Hx of cardiovascular stress test     Comment:  ETT/Lexiscan-Myoview (10/15):  Inferior, inferoapical,               apical lateral defect c/w mild ischemia, EF 66%;               Intermediate Risk No date: Hyperlipidemia No date: Hypertension No date: Right shoulder pain      Comment:  2/2 partial rotator cuff tear, tendinopathy, mild               subacromial/subdeltioid bursitis, A/C joint arthropathy               per MRI done in 3/07 2014: Stroke [facility]) No date: Tobacco abuse  Past Surgical History: 12/18/2013: ANTERIOR LAT LUMBAR FUSION; N/A     Comment:  Procedure: Lumbar Two Anteriorlateral Corpectomy w/               cage, interbody fusion, anterior plating, Lumbar One to               Lumbar Three percutaneous pedicle screws;  Surgeon: Temple Pacini, MD;  Location: [facility] NEURO ORS;  Service:               Neurosurgery;  Laterality: N/A; 10/15/2014: AORTA -INNOMIATE BYPASS; N/A     Comment:  Procedure: ENDARTERCTOMY OF  INNOMINATE ARTERY ;                Surgeon: Sherren Kerns, MD;  Location: [facility] OR;                Service: Vascular;  Laterality: N/A; 08/23/2014: ARCH AORTOGRAM; N/A     Comment:  Procedure: ARCH AORTOGRAM;  Surgeon: Sherren Kerns,  MD;  Location: [facility] CATH LAB;  Service: Cardiovascular;                Laterality: N/A; 08/23/2014: arch aortogram, left carotid,left subclavian angiogram 08/23/2014: CAROTID ANGIOGRAM; Left     Comment:  Procedure: CAROTID ANGIOGRAM;  Surgeon: Sherren Kerns, MD;  Location: [facility] CATH LAB;  Service:               Cardiovascular;  Laterality: Left; 12/2016: CHOLECYSTECTOMY 10/15/2014: ENDARTERECTOMY; Right     Comment:  Procedure: ENDARTERECTOMY RIGHT SUBCLAVIAN ARTERY;                Surgeon: Sherren Kerns, MD;  Location: [facility] OR;                Service: Vascular;  Laterality: Right; 10/15/2014: ENDARTERECTOMY; Right     Comment:  Procedure: ENDARTERECTOMY RIGHT COMMON CAROTID ARTERY;                Surgeon: Sherren Kerns, MD;  Location: [facility] OR;                Service: Vascular;  Laterality: Right; 08/2016: FETAL SURGERY FOR CONGENITAL HERNIA No date: HERNIA REPAIR 03/17/2021: LEFT HEART CATH AND CORONARY ANGIOGRAPHY; N/A     Comment:  Procedure: LEFT HEART  CATH AND CORONARY ANGIOGRAPHY;                Surgeon: Laurier Nancy, MD;  Location: [facility] INVASIVE CV              LAB;  Service: Cardiovascular;  Laterality: N/A; 09/12/2014: LEFT HEART CATHETERIZATION WITH CORONARY ANGIOGRAM; N/A     Comment:  Procedure: LEFT HEART CATHETERIZATION WITH CORONARY               ANGIOGRAM;  Surgeon: Wendall Stade, MD;  Location: [facility]               CATH LAB;  Service: Cardiovascular;  Laterality: N/A; 12/18/2013: LUMBAR SPINE SURGERY     Comment:  L2     DR POOL No date: NECK SURGERY 10/15/2014: PATCH ANGIOPLASTY; Right     Comment:  Procedure: PATCH ANGIOPLASTY OF RIGHT SUBCLAVIAN ARTERY               , RIGHT COMMON CAROTID ARTERY & INNOMINATE ARTERY;                Surgeon: Sherren Kerns, MD;  Location: [facility] OR;                Service: Vascular;  Laterality: Right; 10/15/2014: STERNOTOMY; N/A     Comment:  Procedure: PARTIAL STERNOTOMY & PLATING OF STERNUM;                Surgeon: Sherren Kerns, MD;  Location: [facility] OR;                Service: Vascular;  Laterality: N/A; 12/25/2013: TEE WITHOUT CARDIOVERSION; N/A     Comment:  Procedure: TRANSESOPHAGEAL ECHOCARDIOGRAM (TEE);                Surgeon: Laurey Morale, MD;  Location: [facility] ENDOSCOPY;                Service: Cardiovascular;  Laterality: N/A;     Reproductive/Obstetrics negative OB ROS  Anesthesia Physical Anesthesia Plan  ASA: 3  Anesthesia Plan: General   Post-op Pain Management:    Induction: Intravenous  PONV Risk Score and Plan: Propofol infusion and TIVA  Airway Management Planned: Natural Airway and Nasal Cannula  Additional Equipment:   Intra-op Plan:   Post-operative Plan:   Informed Consent: I have reviewed the patients History and Physical, chart, labs and discussed the procedure including the risks, benefits and alternatives for the proposed anesthesia with the patient or authorized representative who has  indicated his/her understanding and acceptance.     Dental Advisory Given  Plan Discussed with: CRNA and Surgeon  Anesthesia Plan Comments:        Anesthesia Quick Evaluation

## 2023-09-14 NOTE — Op Note (Signed)
[facility] Gastroenterology Patient Name: Bradley Sawyer Procedure Date: 09/14/2023 9:56 AM MRN: [id number] Account #: [id number] Date of Birth: November 02, 1956 Admit Type: Outpatient Age: 67 Room: [facility] ENDO ROOM 4 Gender: Male Note Status: Finalized Instrument Name: Prentice Docker [id number] Procedure:             Colonoscopy Indications:           High risk colon cancer surveillance: Personal history                         of colonic polyps Providers:             [name] MD, MD Referring MD:          No Local Md, MD (Referring MD) Medicines:             Propofol per Anesthesia Complications:         No immediate complications. Procedure:             Pre-Anesthesia Assessment:                        - Prior to the procedure, a History and Physical was                         performed, and patient medications and allergies were                         reviewed. The patient's tolerance of previous                         anesthesia was also reviewed. The risks and benefits                         of the procedure and the sedation options and risks                         were discussed with the patient. All questions were                         answered, and informed consent was obtained. Prior                         Anticoagulants: The patient has taken no anticoagulant                         or antiplatelet agents. ASA Grade Assessment: II - A                         patient with mild systemic disease. After reviewing                         the risks and benefits, the patient was deemed in                         satisfactory condition to undergo the procedure.                        After obtaining informed consent, the colonoscope was  passed under direct vision. Throughout the procedure,                         the patient's blood pressure, pulse, and oxygen                         saturations were monitored continuously. The                          Colonoscope was introduced through the anus and                         advanced to the the cecum, identified by appendiceal                         orifice and ileocecal valve. The colonoscopy was                         performed without difficulty. The patient tolerated                         the procedure well. The quality of the bowel                         preparation was good. Findings:      The perianal and digital rectal examinations were normal.      A 3 mm polyp was found in the rectum. The polyp was sessile. The polyp       was removed with a cold snare. Resection and retrieval were complete.      Three sessile polyps were found in the descending colon. The polyps were       3 to 4 mm in size. These polyps were removed with a cold snare.       Resection and retrieval were complete.      Two sessile polyps were found in the transverse colon. The polyps were 3       to 4 mm in size. These polyps were removed with a cold snare. Resection       and retrieval were complete.      A 3 mm polyp was found in the transverse colon. The polyp was sessile.       The polyp was removed with a cold snare. Resection and retrieval were       complete.      Many small-mouthed diverticula were found in the sigmoid colon.      Non-bleeding internal hemorrhoids were found during retroflexion. The       hemorrhoids were Grade I (internal hemorrhoids that do not prolapse). Impression:            - One 3 mm polyp in the rectum, removed with a cold                         snare. Resected and retrieved.                        - Three 3 to 4 mm polyps in the descending colon,                         removed with a cold snare.  Resected and retrieved.                        - Two 3 to 4 mm polyps in the transverse colon,                         removed with a cold snare. Resected and retrieved.                        - One 3 mm polyp in the transverse colon, removed with                          a cold snare. Resected and retrieved.                        - Diverticulosis in the sigmoid colon.                        - Non-bleeding internal hemorrhoids. Recommendation:        - Discharge patient to home.                        - Resume previous diet.                        - Continue present medications.                        - Await pathology results. Procedure Code(s):     --- Professional ---                        [id number], Colonoscopy, flexible; with removal of                         tumor(s), polyp(s), or other lesion(s) by snare                         technique Diagnosis Code(s):     --- Professional ---                        Z86.010, Personal history of colonic polyps                        D12.4, Benign neoplasm of descending colon CPT copyright 2022 American Medical Association. All rights reserved. The codes documented in this report are preliminary and upon coder review may  be revised to meet current compliance requirements. [name] MD, MD 09/14/2023 10:30:52 AM This report has been signed electronically. Number of Addenda: 0 Note Initiated On: 09/14/2023 9:56 AM Scope Withdrawal Time: 0 hours 14 minutes 49 seconds  Total Procedure Duration: 0 hours 19 minutes 57 seconds  Estimated Blood Loss:  Estimated blood loss: none.      [facility] [employer] [facility]

## 2023-09-15 ENCOUNTER — Encounter: Payer: Self-pay | Admitting: Gastroenterology

## 2023-09-15 LAB — SURGICAL PATHOLOGY

## 2023-09-16 ENCOUNTER — Encounter: Payer: Self-pay | Admitting: Gastroenterology

## 2023-09-19 ENCOUNTER — Encounter: Payer: Self-pay | Admitting: Pulmonary Disease

## 2023-09-19 ENCOUNTER — Ambulatory Visit: Payer: Medicare HMO | Admitting: Pulmonary Disease

## 2023-09-19 ENCOUNTER — Ambulatory Visit (INDEPENDENT_AMBULATORY_CARE_PROVIDER_SITE_OTHER): Payer: Medicare HMO | Admitting: Pulmonary Disease

## 2023-09-19 VITALS — BP 118/78 | HR 67 | Temp 98.0°F | Ht 71.0 in | Wt 213.4 lb

## 2023-09-19 DIAGNOSIS — J9611 Chronic respiratory failure with hypoxia: Secondary | ICD-10-CM

## 2023-09-19 DIAGNOSIS — J4489 Other specified chronic obstructive pulmonary disease: Secondary | ICD-10-CM | POA: Diagnosis not present

## 2023-09-19 DIAGNOSIS — J449 Chronic obstructive pulmonary disease, unspecified: Secondary | ICD-10-CM | POA: Diagnosis not present

## 2023-09-19 DIAGNOSIS — K219 Gastro-esophageal reflux disease without esophagitis: Secondary | ICD-10-CM | POA: Diagnosis not present

## 2023-09-19 DIAGNOSIS — K259 Gastric ulcer, unspecified as acute or chronic, without hemorrhage or perforation: Secondary | ICD-10-CM

## 2023-09-19 NOTE — Progress Notes (Signed)
Subjective:    Patient ID: Bradley Sawyer, male    DOB: January 19, 1956, 67 y.o.   MRN: [id number]  Patient Care Team: [name], MD as PCP - General (Family Medicine) Salena Saner, MD as Consulting Physician (Pulmonary Disease)  Chief Complaint  Patient presents with   Follow-up    No SOB or wheezing. Cough in the morning with clear/white sputum.    BACKGROUND/INTERVAL: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  Asthma is moderate persistent with seasonal variation.  Patient also has chronic respiratory failure with hypoxia requiring 3 L/min of oxygen.  Patient presents today for follow-up, last seen on 23 August 2023.  Since that visit he has had upper endoscopy on 14 September 2023 under general anesthesia.  He was noted to have a gastric ulcer.  He is to have follow-up EGD in 6 weeks to check for healing.  HPI Discussed the use of AI scribe software for clinical note transcription with the patient, who gave verbal consent to proceed.  History of Present Illness   [name], a 67 year old with stage three COPD and asthma overlap, is currently maintained on Breztri and Dupixent. The patient presents for follow-up after a recent discovery of an ulcer during an EGD procedure. The patient was experiencing significant acid reflux, which prompted the investigation. The reflux is suspected to be related to the ulcer.  The patient reports that breathing has been easier since the temperature dropped. They continue to administer Dupixent shots and use an inhaler. The patient also mentions a history of nerve pain on each hip, which was exacerbated by previous pulmonary rehab sessions. They completed five or six sessions before discontinuing due to the increased "nerve" pain.  The patient reports minimal coughing, primarily in the mornings. They have not yet received a  flu shot for the current season. The patient's breathing has improved significantly since starting the current treatment regimen, with no hospital admissions or wheezing noted recently. The patient is scheduled to follow up with another physician regarding the stomach ulcer.      Review of Systems A 10 point review of systems was performed and it is as noted above otherwise negative.   Patient Active Problem List   Diagnosis Date Noted   Polyp of colon 09/14/2023   History of colonic polyps 09/14/2023   Chest pain 08/26/2023   AKI (acute kidney injury) (HCC) 08/26/2023   Hypotension 08/26/2023   Leucocytosis 08/26/2023   BPH (benign prostatic hyperplasia) 08/26/2023   Depression with anxiety 08/26/2023   Chronic respiratory failure with hypoxia (HCC) 03/30/2021   COPD exacerbation (HCC) 02/27/2021   History of CVA (cerebrovascular accident) 02/27/2021   Chronic pain, radicular 02/27/2021   Chronic, continuous use of opioids 02/27/2021   Unstable angina (HCC) 02/19/2021   Cigarette nicotine dependence without complication 05/24/2018   Acute lower UTI 02/19/2017   UTI (urinary tract infection) 02/19/2017   Left inguinal hernia 08/12/2016   Elevated troponin 05/08/2016   Anal fissure 03/15/2016   External hemorrhoid 03/15/2016   Acute viral pharyngitis 03/20/2015   Olecranon bursitis of left elbow 12/23/2014   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   Carotid stenosis 10/15/2014   Stenosis of right subclavian artery (HCC) 09/19/2014   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   PAD (peripheral artery disease) (HCC) 08/23/2014   Preventative health care 08/12/2014   Occlusion and stenosis of carotid  artery without mention of cerebral infarction 07/25/2014   Subclavian steal syndrome 07/25/2014   CAD (coronary artery disease) 07/10/2014   Nocturia 04/17/2014   Testicular pain, right 04/17/2014   Urethral stricture 04/17/2014   Cerebral infarction (HCC) 04/10/2014   Other  depression due to general medical condition 08/31/2013   Hematuria 08/06/2013   Compression fracture of L2, with back and right groin pain 07/18/2013   History of colon polyps 05/18/2012   COPD (chronic obstructive pulmonary disease) (HCC) 12/24/2009   Hyperlipemia 09/20/2006   TOBACCO ABUSE 09/20/2006   Essential hypertension 09/20/2006   GERD 09/20/2006    Social History   Tobacco Use   Smoking status: Former    Current packs/day: 0.00    Average packs/day: 2.0 packs/day for 48.0 years (96.0 ttl pk-yrs)    Types: Cigarettes    Start date: 11/22/1972    Quit date: 11/22/2020    Years since quitting: 2.8   Smokeless tobacco: Never  Substance Use Topics   Alcohol use: No    Alcohol/week: 0.0 standard drinks of alcohol    Allergies  Allergen Reactions   Bee Venom Swelling    Current Meds  Medication Sig   albuterol (PROVENTIL) (2.5 MG/3ML) 0.083% nebulizer solution USE 1 AMPULE IN NEBULIZER EVERY 6 HOURS AS NEEDED.   albuterol (VENTOLIN HFA) 108 (90 Base) MCG/ACT inhaler INHALE 2 PUFFS BY MOUTH EVERY 4-6 HOURS AS NEEDED FOR WHEEZING   aspirin EC 81 MG tablet Take 81 mg by mouth daily.   atorvastatin (LIPITOR) 80 MG tablet TAKE ONE TABLET BY MOUTH ONCE DAILY AT BEDTIME   BREZTRI AEROSPHERE 160-9-4.8 MCG/ACT AERO INHALE TWO PUFFS into THE lungs TWICE DAILY morning & bedtime   clopidogrel (PLAVIX) 75 MG tablet TAKE ONE TABLET BY MOUTH ONCE DAILY   Dupilumab (DUPIXENT) 300 MG/2ML SOPN Inject 300 mg into the skin every 14 (fourteen) days.   EPINEPHrine 0.3 mg/0.3 mL IJ SOAJ injection Inject 0.3 mg into the muscle as needed for anaphylaxis.   famotidine (PEPCID) 40 MG tablet TAKE ONE TABLET BY MOUTH ONCE DAILY   FLUoxetine (PROZAC) 20 MG capsule Take 20 mg by mouth as needed.   FLUoxetine (PROZAC) 40 MG capsule Take 40 mg by mouth daily.   furosemide (LASIX) 20 MG tablet TAKE ONE TABLET BY MOUTH EVERY DAY   isosorbide mononitrate (IMDUR) 30 MG 24 hr tablet Take 30 mg by mouth daily.    levocetirizine (XYZAL) 5 MG tablet Take 5 mg by mouth at bedtime.   linaclotide (LINZESS) 290 MCG CAPS capsule Take 290 mcg by mouth daily before breakfast.   lisinopril-hydrochlorothiazide (ZESTORETIC) 20-25 MG tablet TAKE ONE TABLET BY MOUTH ONCE DAILY   meloxicam (MOBIC) 15 MG tablet Take 15 mg by mouth daily.   metFORMIN (GLUCOPHAGE) 500 MG tablet Take 500 mg by mouth daily.   montelukast (SINGULAIR) 10 MG tablet Take 10 mg by mouth daily.   Multiple Vitamin (MULTI-VITAMINS) TABS Take 1 tablet by mouth daily.   naloxone (NARCAN) nasal spray 4 mg/0.1 mL Place 0.4 mg into the nose once.   nitroGLYCERIN (NITROSTAT) 0.4 MG SL tablet DISSOLVE (1) TABLET UNDER TONGUE AS NEEDED TO RELIEVE CHEST PAIN. MAY REPEAT EVERY 5 MINUTES.   ondansetron (ZOFRAN) 4 MG tablet Take by mouth.   oxyCODONE (ROXICODONE) 15 MG immediate release tablet Take 15 mg by mouth.   pantoprazole (PROTONIX) 40 MG tablet Take 1 tablet (40 mg total) by mouth 2 (two) times daily before a meal.   polyethylene glycol (MIRALAX /  GLYCOLAX) packet Take 17 g by mouth daily as needed (for constipation).   pregabalin (LYRICA) 150 MG capsule Take 150 mg by mouth daily.   rOPINIRole (REQUIP) 1 MG tablet Take 1 tablet by mouth at bedtime.   spironolactone (ALDACTONE) 25 MG tablet TAKE ONE TABLET BY MOUTH ONCE DAILY   tamsulosin (FLOMAX) 0.4 MG CAPS capsule TAKE ONE CAPSULE BY MOUTH EVERY DAY    Immunization History  Administered Date(s) Administered   DTaP 12/04/2018   Fluad Quad(high Dose 65+) 10/08/2021   Influenza, High Dose Seasonal PF 10/08/2021   Influenza, Seasonal, Injecte, Preservative Fre 08/01/2013, 08/12/2014, 08/26/2015, 08/06/2016, 09/26/2019   Influenza,inj,Quad PF,6+ Mos 08/01/2013, 08/12/2014   Influenza-Unspecified 10/30/2018, 09/26/2019, 09/10/2020   Moderna Sars-Covid-2 Vaccination 02/06/2020, 03/05/2020, 12/12/2020   PNEUMOCOCCAL CONJUGATE-20 01/27/2022   Pneumococcal Polysaccharide-23 10/20/2014    Pneumococcal-Unspecified 10/20/2014      Objective:     BP 118/78 (BP Location: Right Arm, Cuff Size: Normal)   Pulse 67   Temp 98 F (36.7 C)   Ht 5\' 11"  (1.803 m)   Wt 213 lb 6.4 oz (96.8 kg)   SpO2 99%   BMI 29.76 kg/m   SpO2: 99 % O2 Device: None (Room air) O2 Flow Rate (L/min): 3 L/min O2 Type: Continuous O2  GENERAL: Well-developed, overweight gentleman, no acute distress.  HEAD: Normocephalic, atraumatic. EYES: Pupils equal, round, reactive to light.  No scleral icterus. MOUTH: Edentulous, oral mucosa moist, no thrush. NECK: Supple. No thyromegaly. Trachea midline. No JVD.  No adenopathy. PULMONARY: Good air entry bilaterally.  No wheezes noted today. CARDIOVASCULAR: S1 and S2. Regular rate and rhythm.  No rubs, murmurs or gallops heard. ABDOMEN: Benign. MUSCULOSKELETAL: No joint deformity, no clubbing, no edema. NEUROLOGIC: No focal deficits, no gait disturbance.  Speech is fluent. SKIN: Intact,warm,dry. PSYCH: Mood and behavior normal.   Assessment & Plan:     ICD-10-CM   1. Stage 3 severe COPD by GOLD classification (HCC)  J44.9 AMB referral to pulmonary rehabilitation    2. COPD with asthma (HCC)  J44.89     3. Chronic respiratory failure with hypoxia (HCC)  J96.11     4. Gastroesophageal reflux disease without esophagitis  K21.9     5. Gastric ulcer without hemorrhage or perforation, unspecified chronicity  K25.9      Orders Placed This Encounter  Procedures   AMB referral to pulmonary rehabilitation    Referral Priority:   Routine    Referral Type:   Consultation    Number of Visits Requested:   1   Assessment and Plan    COPD/Asthma Overlap Stable on Breztri and Dupixent. No wheezing noted on exam. -Continue current regimen. -Consider outpatient pulmonary rehab via telehealth to improve symptoms and quality of life.  Gastric Ulcer Recently diagnosed, with associated acid reflux. Awaiting biopsy results. -Continue PPI -Follow up with Dr.  Servando Snare for further management. -Repeat EGD in 6 weeks recommended per GI.  General Health Maintenance -Get flu shot at the pharmacy. -Follow up in three months.      Gailen Shelter, MD Advanced Bronchoscopy PCCM [facility]    *This note was dictated using voice recognition software/Dragon.  Despite best efforts to proofread, errors can occur which can change the meaning. Any transcriptional errors that result from this process are unintentional and may not be fully corrected at the time of dictation.

## 2023-09-19 NOTE — Patient Instructions (Signed)
VISIT SUMMARY:  Today, we discussed your current health status, focusing on your COPD and asthma overlap, as well as a recently discovered gastric ulcer. You reported that your breathing has improved with the cooler weather and your current medications. We also reviewed your nerve pain and general health maintenance.  YOUR PLAN:  -COPD/ASTHMA OVERLAP: COPD and asthma overlap is a condition where symptoms of both chronic obstructive pulmonary disease and asthma are present. Your condition is stable with your current medications, Breztri and Dupixent. We recommend continuing this regimen and considering outpatient pulmonary rehab via telehealth to further improve your symptoms and quality of life.  -GASTRIC ULCER: A gastric ulcer is a sore on the lining of your stomach, often caused by acid reflux. You have been recently diagnosed with this condition and are awaiting biopsy results. Please follow up with Dr. Servando Snare for further management.  -GENERAL HEALTH MAINTENANCE: For your general health, it is important to get your flu shot at the pharmacy. We will follow up in three months to review your overall health and any new developments.  INSTRUCTIONS:  Please follow up with Dr. Servando Snare regarding your gastric ulcer. Additionally, get your flu shot at the pharmacy and schedule a follow-up appointment with [place] in three months.

## 2023-09-20 ENCOUNTER — Telehealth: Payer: Self-pay | Admitting: Gastroenterology

## 2023-09-20 DIAGNOSIS — K259 Gastric ulcer, unspecified as acute or chronic, without hemorrhage or perforation: Secondary | ICD-10-CM

## 2023-09-20 NOTE — Telephone Encounter (Signed)
Patient daughter in law [facility]) called in to schedule the patient repeat procedure.

## 2023-09-23 ENCOUNTER — Other Ambulatory Visit: Payer: Self-pay

## 2023-09-23 DIAGNOSIS — K259 Gastric ulcer, unspecified as acute or chronic, without hemorrhage or perforation: Secondary | ICD-10-CM

## 2023-09-23 NOTE — Telephone Encounter (Signed)
This encounter was created in error - please disregard.

## 2023-09-23 NOTE — Telephone Encounter (Signed)
Procedure scheduled for 10/26/23 Instructions mailed and clearance faxed to cardiology for blood thinner request

## 2023-09-29 NOTE — Telephone Encounter (Signed)
Blood thinner clearance has been faxed x 3

## 2023-10-04 ENCOUNTER — Other Ambulatory Visit (HOSPITAL_COMMUNITY): Payer: Self-pay

## 2023-10-04 ENCOUNTER — Other Ambulatory Visit (HOSPITAL_COMMUNITY): Payer: Self-pay | Admitting: Pharmacy Technician

## 2023-10-04 NOTE — Progress Notes (Signed)
Specialty Pharmacy Refill Coordination Note  Bradley Sawyer is a 67 y.o. male contacted today regarding refills of specialty medication(s) Dupilumab  Spoke with Daughter in-law  Patient requested Delivery   Delivery date: 10/12/23   Verified address: [address] [place]   Medication will be filled on 10/11/2023.

## 2023-10-11 ENCOUNTER — Other Ambulatory Visit (HOSPITAL_COMMUNITY): Payer: Self-pay

## 2023-10-13 ENCOUNTER — Telehealth: Payer: Self-pay

## 2023-10-13 NOTE — Telephone Encounter (Signed)
...     Pre-operative Risk Assessment    Patient Name: Bradley Sawyer  DOB: 1956-05-30 MRN: [id number]      Request for Surgical Clearance    Procedure:   egd  Date of Surgery:  Clearance 10/26/23                                 Surgeon:  Loreli Slot Surgeon's Group or Practice Name:  [facility] Phone number:  [phone] Fax number:  [phone]   Type of Clearance Requested:   - Medical  - Pharmacy:  Hold Aspirin and Clopidogrel (Plavix)     Type of Anesthesia:  General    Additional requests/questions:   last o/v 08/26/23, not f/u appt  Signed, Renee Ramus   10/13/2023, 1:23 PM

## 2023-10-13 NOTE — Telephone Encounter (Signed)
Clearance faxed x 3

## 2023-10-17 NOTE — Telephone Encounter (Signed)
   Patient Name: Bradley Sawyer  DOB: 02/12/1956 MRN: [id number]  Primary Cardiologist: None  Chart reviewed as part of pre-operative protocol coverage. Pre-op clearance already addressed by colleagues in earlier phone notes. To summarize recommendations:  -Preoperative cardiovascular risk assessment: Bradley Sawyer has poor functional status and complains of chronic dyspnea and chest discomfort.  I do not think he would tolerate a pharmacologic myocardial perfusion stress test on account of his obstructive lung disease.  Cardiac catheterization just over 2 years ago showed minimal CAD.  As above, we will obtain an echocardiogram and labs.  If these are reassuring, I think Bradley Sawyer can proceed with his endoscopic procedures, with our low risk from a cardiovascular standpoint, without additional cardiac testing or intervention -Dr. Okey Dupre  Will route this bundled recommendation to requesting provider via Epic fax function and remove from pre-op pool. Please call with questions.  Sharlene Dory, PA-C 10/17/2023, 7:21 AM

## 2023-10-18 ENCOUNTER — Other Ambulatory Visit: Payer: Self-pay

## 2023-10-18 ENCOUNTER — Encounter: Payer: Self-pay | Admitting: Gastroenterology

## 2023-10-25 ENCOUNTER — Telehealth: Payer: Self-pay

## 2023-10-25 ENCOUNTER — Telehealth: Payer: Self-pay | Admitting: Internal Medicine

## 2023-10-25 NOTE — Telephone Encounter (Signed)
[name] called me back and she stated that she was informed for her father-in-law was to hold his Plavix 3 days prior to his procedure and that he forgot about it since they were busy with the holiday. Therefore, they requested for it to be rescheduled. I then called Vickie at the endoscopy unit to let her know that the patient would like to have his procedure rescheduled to November 24, 2023. [name] was reminded to let Mr. Pooley know that he needs to hold his Plavix 3 days prior to procedure and she stated that she was saving it in her phone to let him know. [name] had no further questions.

## 2023-10-25 NOTE — Telephone Encounter (Signed)
Left message for Bradley Sawyer at GI office the fax # [phone] has been busy for a while and I was calling to see if alternate fax# maybe .

## 2023-10-25 NOTE — Telephone Encounter (Signed)
I s/w Maritza with Dr. Annabell Sabal office about clearance. See the previous notes pt cleared by Dr. Okey Dupre. However there is no documentation about how long to hold Plavix. Kandis Cocking states the pt's daughter-in-law said they were told hold Plavix x 3 days, though both Martiza and I did not see this documented anywhere. I assured Kandis Cocking that I will have the pre op APP address about the Plavix hold time and we will re-fax notes with recommendations. Kandis Cocking thanked me for the help. Procedure has been moved to 11/24/23.

## 2023-10-25 NOTE — Telephone Encounter (Signed)
He is on Plavix and aspirin for peripheral vascular disease, therefore clearance will need to be directed by vascular surgery.   Levi Aland, NP-C 10/25/2023, 3:38 PM [address] Office [phone] Fax [phone]

## 2023-10-25 NOTE — Telephone Encounter (Signed)
Patient's daughter-in-law-Bradley Sawyer called wanting to know how many days was to hold his Aspirin and Plavix. I told her that I needed to check in Bradley Sawyer's chart and then  would call her back. I then checked and I saw that Bradley Sawyer had been trying to get clearance to hold his ASA and Plavix but I do not see anything approving the hold before his procedure-EGD. I will contact Bradley Sawyer office and request something in writing so I could tell the patient how many days to hold his medications. Once I called them, they stated that they would fax [place] the clearance to hold his medication. Then I will contact Bradley Sawyer to let her know.

## 2023-10-25 NOTE — Telephone Encounter (Signed)
Bradley Sawyer with [facility] states clearance recommendation has not been received + fax number was incorrect.  Phone#: [phone] Fax#: [phone]

## 2023-10-25 NOTE — Telephone Encounter (Signed)
[name] called back wanting to know if they need to show up or schedule her father in law procedure.

## 2023-10-25 NOTE — Telephone Encounter (Signed)
Withdrawn Withdrawn Authorization [id number]  Tracking [id number] IN ERROR    Approved Authorization #[id number]  Tracking #[id number]  APPROVED

## 2023-10-28 ENCOUNTER — Other Ambulatory Visit: Payer: Self-pay

## 2023-11-04 ENCOUNTER — Other Ambulatory Visit: Payer: Self-pay

## 2023-11-04 ENCOUNTER — Other Ambulatory Visit: Payer: Self-pay | Admitting: Pulmonary Disease

## 2023-11-04 ENCOUNTER — Other Ambulatory Visit (HOSPITAL_COMMUNITY): Payer: Self-pay

## 2023-11-04 DIAGNOSIS — J4489 Other specified chronic obstructive pulmonary disease: Secondary | ICD-10-CM

## 2023-11-04 MED ORDER — DUPIXENT 300 MG/2ML ~~LOC~~ SOAJ
300.0000 mg | SUBCUTANEOUS | 0 refills | Status: DC
  Filled 2023-11-04: qty 4, 28d supply, fill #0

## 2023-11-04 NOTE — Progress Notes (Signed)
Specialty Pharmacy Refill Coordination Note  Bradley Sawyer is a 67 y.o. male contacted today regarding refills of specialty medication(s) Dupilumab (Dupixent)   Patient requested Delivery   Delivery date: 11/09/23   Verified address: [address]   [place] [address]   Medication will be filled on 11/08/23 pending a refill request.

## 2023-11-07 ENCOUNTER — Other Ambulatory Visit: Payer: Self-pay | Admitting: Gastroenterology

## 2023-11-08 ENCOUNTER — Other Ambulatory Visit: Payer: Self-pay

## 2023-11-24 ENCOUNTER — Ambulatory Visit
Admission: RE | Admit: 2023-11-24 | Discharge: 2023-11-24 | Disposition: A | Discharge status: Home / Self Care | Payer: Medicare HMO | Attending: Gastroenterology | Admitting: Gastroenterology

## 2023-11-24 ENCOUNTER — Encounter
Admission: RE | Disposition: A | Discharge status: Home / Self Care | Payer: Self-pay | Source: Home / Self Care | Attending: Gastroenterology

## 2023-11-24 ENCOUNTER — Ambulatory Visit: Payer: Medicare HMO | Admitting: Certified Registered Nurse Anesthetist

## 2023-11-24 ENCOUNTER — Encounter: Payer: Self-pay | Admitting: Gastroenterology

## 2023-11-24 DIAGNOSIS — J449 Chronic obstructive pulmonary disease, unspecified: Secondary | ICD-10-CM | POA: Diagnosis not present

## 2023-11-24 DIAGNOSIS — K760 Fatty (change of) liver, not elsewhere classified: Secondary | ICD-10-CM | POA: Diagnosis not present

## 2023-11-24 DIAGNOSIS — Z09 Encounter for follow-up examination after completed treatment for conditions other than malignant neoplasm: Secondary | ICD-10-CM | POA: Diagnosis present

## 2023-11-24 DIAGNOSIS — I10 Essential (primary) hypertension: Secondary | ICD-10-CM | POA: Insufficient documentation

## 2023-11-24 DIAGNOSIS — Z8711 Personal history of peptic ulcer disease: Secondary | ICD-10-CM | POA: Insufficient documentation

## 2023-11-24 DIAGNOSIS — Z9981 Dependence on supplemental oxygen: Secondary | ICD-10-CM | POA: Diagnosis not present

## 2023-11-24 DIAGNOSIS — Z87891 Personal history of nicotine dependence: Secondary | ICD-10-CM | POA: Insufficient documentation

## 2023-11-24 DIAGNOSIS — K219 Gastro-esophageal reflux disease without esophagitis: Secondary | ICD-10-CM | POA: Insufficient documentation

## 2023-11-24 DIAGNOSIS — I2510 Atherosclerotic heart disease of native coronary artery without angina pectoris: Secondary | ICD-10-CM | POA: Insufficient documentation

## 2023-11-24 DIAGNOSIS — K259 Gastric ulcer, unspecified as acute or chronic, without hemorrhage or perforation: Secondary | ICD-10-CM

## 2023-11-24 HISTORY — PX: ESOPHAGOGASTRODUODENOSCOPY (EGD) WITH PROPOFOL: SHX5813

## 2023-11-24 SURGERY — ESOPHAGOGASTRODUODENOSCOPY (EGD) WITH PROPOFOL
Anesthesia: General

## 2023-11-24 MED ORDER — PROPOFOL 10 MG/ML IV BOLUS
INTRAVENOUS | Status: DC | PRN
  Administered 2023-11-24: 150 ug/kg/min via INTRAVENOUS
  Administered 2023-11-24: 70 mg via INTRAVENOUS

## 2023-11-24 MED ORDER — SODIUM CHLORIDE 0.9 % IV SOLN: INTRAVENOUS | Status: DC

## 2023-11-24 MED ORDER — LIDOCAINE HCL (CARDIAC) PF 100 MG/5ML IV SOSY
PREFILLED_SYRINGE | INTRAVENOUS | Status: DC | PRN
  Administered 2023-11-24: 50 mg via INTRAVENOUS

## 2023-11-24 NOTE — Anesthesia Procedure Notes (Signed)
 Date/Time: 11/24/2023 9:16 AM  Performed by: Dominica Krabbe, CRNAPre-anesthesia Checklist: Patient identified, Emergency Drugs available, Suction available and Patient being monitored Patient Re-evaluated:Patient Re-evaluated prior to induction Oxygen Delivery Method: Nasal cannula Preoxygenation: Pre-oxygenation with 100% oxygen Induction Type: IV induction

## 2023-11-24 NOTE — H&P (Signed)
 Bradley Copping, MD [facility] [address] [place], [place] [address] Phone:[phone] Fax : [phone]  Primary Care Physician:  Corrington, Kip A, MD Primary Gastroenterologist:  Dr. Copping  Pre-Procedure History & Physical: HPI:  Bradley Sawyer is a 68 y.o. male is here for an endoscopy.   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   Compression fracture    L2   COPD (chronic obstructive pulmonary disease) (HCC)    bronchitis   CVA (cerebral infarction)    Disorder of vocal cord    Dyspnea    Fatty liver    GERD (gastroesophageal reflux disease)    Hx of cardiovascular stress test    ETT/Lexiscan -Myoview (10/15):  Inferior, inferoapical, apical lateral defect c/w mild ischemia, EF 66%; Intermediate Risk   Hyperlipidemia    Hypertension    Right shoulder pain    2/2 partial rotator cuff tear, tendinopathy, mild subacromial/subdeltioid bursitis, A/C joint arthropathy per MRI done in 3/07   Stroke (HCC) 2014   Tobacco abuse     Past Surgical History:  Procedure Laterality Date   ANTERIOR LAT LUMBAR FUSION N/A 12/18/2013   Procedure: Lumbar Two Anteriorlateral Corpectomy w/ cage, interbody fusion, anterior plating, Lumbar One to Lumbar Three percutaneous pedicle screws;  Surgeon: Victory DELENA Gunnels, MD;  Location: [facility];  Service: Neurosurgery;  Laterality: N/A;   AORTA -INNOMIATE BYPASS N/A 10/15/2014   Procedure: ENDARTERCTOMY OF  INNOMINATE ARTERY ;  Surgeon: Carlin FORBES Haddock, MD;  Location: [facility] OR;  Service: Vascular;  Laterality: N/A;   ARCH AORTOGRAM N/A 08/23/2014   Procedure: ARCH AORTOGRAM;  Surgeon: Carlin FORBES Haddock, MD;  Location: [facility] CATH LAB;  Service: Cardiovascular;  Laterality: N/A;   arch aortogram, left carotid,left subclavian angiogram  08/23/2014   BIOPSY  09/14/2023   Procedure: BIOPSY;  Surgeon:  Sawyer Rogelia, MD;  Location: [facility] ENDOSCOPY;  Service: Endoscopy;;   CAROTID ANGIOGRAM Left 08/23/2014   Procedure: CAROTID GERALYN;  Surgeon: Carlin FORBES Haddock, MD;  Location: [facility] CATH LAB;  Service: Cardiovascular;  Laterality: Left;   CHOLECYSTECTOMY  12/2016   COLONOSCOPY WITH PROPOFOL  N/A 09/14/2023   Procedure: COLONOSCOPY WITH PROPOFOL ;  Surgeon: Sawyer Rogelia, MD;  Location: [facility] ENDOSCOPY;  Service: Endoscopy;  Laterality: N/A;   ENDARTERECTOMY Right 10/15/2014   Procedure: ENDARTERECTOMY RIGHT SUBCLAVIAN ARTERY;  Surgeon: Carlin FORBES Haddock, MD;  Location: [facility] OR;  Service: Vascular;  Laterality: Right;   ENDARTERECTOMY Right 10/15/2014   Procedure: ENDARTERECTOMY RIGHT COMMON CAROTID ARTERY;  Surgeon: Carlin FORBES Haddock, MD;  Location: [facility] OR;  Service: Vascular;  Laterality: Right;   ESOPHAGOGASTRODUODENOSCOPY (EGD) WITH PROPOFOL  N/A 09/14/2023   Procedure: ESOPHAGOGASTRODUODENOSCOPY (EGD) WITH PROPOFOL ;  Surgeon: Sawyer Rogelia, MD;  Location: [facility] ENDOSCOPY;  Service: Endoscopy;  Laterality: N/A;   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   HERNIA REPAIR     LEFT HEART CATH AND CORONARY ANGIOGRAPHY N/A 03/17/2021   Procedure: LEFT HEART CATH AND CORONARY ANGIOGRAPHY;  Surgeon: Fernand Denyse DELENA, MD;  Location: [facility] INVASIVE CV LAB;  Service: Cardiovascular;  Laterality: N/A;   LEFT HEART CATHETERIZATION WITH CORONARY ANGIOGRAM N/A 09/12/2014   Procedure: LEFT HEART CATHETERIZATION WITH CORONARY ANGIOGRAM;  Surgeon: Maude JAYSON Emmer, MD;  Location: [facility] CATH LAB;  Service: Cardiovascular;  Laterality: N/A;   LUMBAR SPINE SURGERY  12/18/2013  L2     DR POOL   NECK SURGERY     PATCH ANGIOPLASTY Right 10/15/2014   Procedure: PATCH ANGIOPLASTY OF RIGHT SUBCLAVIAN ARTERY , RIGHT COMMON CAROTID ARTERY & INNOMINATE ARTERY;  Surgeon: Carlin FORBES Haddock, MD;  Location: [facility] OR;  Service: Vascular;  Laterality: Right;   STERNOTOMY N/A 10/15/2014   Procedure: PARTIAL STERNOTOMY & PLATING OF STERNUM;   Surgeon: Carlin FORBES Haddock, MD;  Location: [facility] OR;  Service: Vascular;  Laterality: N/A;   TEE WITHOUT CARDIOVERSION N/A 12/25/2013   Procedure: TRANSESOPHAGEAL ECHOCARDIOGRAM (TEE);  Surgeon: Ezra GORMAN Shuck, MD;  Location: [facility] ENDOSCOPY;  Service: Cardiovascular;  Laterality: N/A;    Prior to Admission medications   Medication Sig Start Date End Date Taking? Authorizing Provider  albuterol  (PROVENTIL ) (2.5 MG/3ML) 0.083% nebulizer solution USE 1 AMPULE IN NEBULIZER EVERY 6 HOURS AS NEEDED. 06/29/21   Tamea Dedra CROME, MD  albuterol  (VENTOLIN  HFA) 108 (90 Base) MCG/ACT inhaler INHALE 2 PUFFS BY MOUTH EVERY 4-6 HOURS AS NEEDED FOR WHEEZING 02/25/23   Tamea Dedra CROME, MD  aspirin  EC 81 MG tablet Take 81 mg by mouth daily.    [provider]  atorvastatin  (LIPITOR ) 80 MG tablet TAKE ONE TABLET BY MOUTH ONCE DAILY AT BEDTIME 09/05/23   Fernand Denyse LABOR, MD  BREZTRI  AEROSPHERE 160-9-4.8 MCG/ACT AERO INHALE TWO PUFFS into THE lungs TWICE DAILY morning & bedtime 06/13/23   Tamea Dedra CROME, MD  clopidogrel  (PLAVIX ) 75 MG tablet TAKE ONE TABLET BY MOUTH ONCE DAILY 09/05/23   Fernand Denyse LABOR, MD  Dupilumab  (DUPIXENT ) 300 MG/2ML SOAJ Inject 300 mg into the skin every 14 (fourteen) days. 11/04/23   Tamea Dedra CROME, MD  EPINEPHrine  0.3 mg/0.3 mL IJ SOAJ injection Inject 0.3 mg into the muscle as needed for anaphylaxis. 04/04/20   [provider]  famotidine  (PEPCID ) 40 MG tablet TAKE ONE TABLET BY MOUTH ONCE DAILY 09/05/23   Fernand Denyse LABOR, MD  FLUoxetine  (PROZAC ) 20 MG capsule Take 20 mg by mouth as needed. 06/01/21   [provider]  FLUoxetine  (PROZAC ) 40 MG capsule Take 40 mg by mouth daily. 09/15/23   [provider]  furosemide  (LASIX ) 20 MG tablet TAKE ONE TABLET BY MOUTH EVERY DAY 09/05/23   Fernand Denyse LABOR, MD  isosorbide  mononitrate (IMDUR ) 30 MG 24 hr tablet Take 30 mg by mouth daily.    [provider]  levocetirizine (XYZAL ) 5 MG tablet Take 5 mg by  mouth at bedtime. 07/15/20   [provider]  linaclotide  (LINZESS ) 290 MCG CAPS capsule Take 290 mcg by mouth daily before breakfast. 12/05/19   [provider]  lisinopril -hydrochlorothiazide  (ZESTORETIC ) 20-25 MG tablet TAKE ONE TABLET BY MOUTH ONCE DAILY 09/05/23   Fernand Denyse LABOR, MD  meloxicam  (MOBIC ) 15 MG tablet Take 15 mg by mouth daily. 09/15/23   [provider]  metFORMIN  (GLUCOPHAGE ) 500 MG tablet Take 500 mg by mouth daily. 07/22/20   [provider]  montelukast  (SINGULAIR ) 10 MG tablet Take 10 mg by mouth daily. 07/15/20   [provider]  Multiple Vitamin (MULTI-VITAMINS) TABS Take 1 tablet by mouth daily.    [provider]  naloxone  (NARCAN ) nasal spray 4 mg/0.1 mL Place 0.4 mg into the nose once. 09/02/16   [provider]  nitroGLYCERIN  (NITROSTAT ) 0.4 MG SL tablet DISSOLVE (1) TABLET UNDER TONGUE AS NEEDED TO RELIEVE CHEST PAIN. MAY REPEAT EVERY 5 MINUTES. 05/06/23   Fernand Denyse LABOR, MD  ondansetron  (ZOFRAN ) 4 MG  tablet Take by mouth. 02/04/22   [provider]  oxyCODONE  (ROXICODONE ) 15 MG immediate release tablet Take 15 mg by mouth. 10/09/21   [provider]  pantoprazole  (PROTONIX ) 40 MG tablet TAKE ONE TABLET BY MOUTH TWICE DAILY BEFORE A MEAL. 11/10/23   Jinny Carmine, MD  polyethylene glycol (MIRALAX  / GLYCOLAX ) packet Take 17 g by mouth daily as needed (for constipation).    [provider]  pregabalin  (LYRICA ) 150 MG capsule Take 150 mg by mouth daily. 09/20/19   [provider]  rOPINIRole  (REQUIP ) 1 MG tablet Take 1 tablet by mouth at bedtime. 06/01/21   [provider]  spironolactone  (ALDACTONE ) 25 MG tablet TAKE ONE TABLET BY MOUTH ONCE DAILY 09/05/23   Fernand Denyse LABOR, MD  tamsulosin  (FLOMAX ) 0.4 MG CAPS capsule TAKE ONE CAPSULE BY MOUTH EVERY DAY 03/07/23   McGowan, Clotilda A, PA-C    Allergies as of 09/23/2023 - Review Complete 09/19/2023  Allergen Reaction  Noted   Bee venom Swelling 04/04/2020    Family History  Problem Relation Age of Onset   Heart disease Father    Heart attack Father    Diabetes Paternal Grandmother        1 st degree relatives   Hypertension Paternal Grandmother    Diabetes Brother    Heart disease Brother    Muscular dystrophy Brother    Muscular dystrophy Sister    Prostate cancer Neg Hx    Kidney cancer Neg Hx    Bladder Cancer Neg Hx     Social History   Socioeconomic History   Marital status: Divorced    Spouse name: Not on file   Number of children: 1   Years of education: GED   Highest education level: Not on file  Occupational History   Occupation: Disabled  Tobacco Use   Smoking status: Former    Current packs/day: 0.00    Average packs/day: 2.0 packs/day for 48.0 years (96.0 ttl pk-yrs)    Types: Cigarettes    Start date: 11/22/1972    Quit date: 11/22/2020    Years since quitting: 3.0   Smokeless tobacco: Never  Vaping Use   Vaping status: Never Used  Substance and Sexual Activity   Alcohol use: No    Alcohol/week: 0.0 standard drinks of alcohol   Drug use: No    Types: Cocaine, Marijuana    Comment: no recent use   Sexual activity: Not on file  Other Topics Concern   Not on file  Social History Narrative   Patient is single with one child.   Patient is right handed.   Patient has his GED.   Patient drinks 5 or more cups daily.   Social Drivers of Corporate Investment Banker Strain: Low Risk  (10/31/2023)   Received from [facility]   Overall Financial Resource Strain (CARDIA)    Difficulty of Paying Living Expenses: Not hard at all  Food Insecurity: No Food Insecurity (10/31/2023)   Received from [facility]   Hunger Vital Sign    Worried About Running Out of Food in the Last Year: Never true    Ran Out of Food in the Last Year: Never true  Transportation Needs: No Transportation Needs (10/31/2023)   Received from [facility] - Transportation    Lack of  Transportation (Medical): No    Lack of Transportation (Non-Medical): No  Physical Activity: Unknown (10/31/2023)   Received from [facility]   Exercise Vital Sign  Days of Exercise per Week: 0 days    Minutes of Exercise per Session: Not on file  Stress: No Stress Concern Present (10/31/2023)   Received from [facility] - Occupational Stress Questionnaire    Feeling of Stress : Only a little  Social Connections: Moderately Integrated (10/31/2023)   Received from [facility]   Social Network    How would you rate your social network (family, work, friends)?: Adequate participation with social networks  Intimate Partner Violence: Not At Risk (10/31/2023)   Received from [facility]   HITS    Over the last 12 months how often did your partner physically hurt you?: Never    Over the last 12 months how often did your partner insult you or talk down to you?: Never    Over the last 12 months how often did your partner threaten you with physical harm?: Never    Over the last 12 months how often did your partner scream or curse at you?: Never    Review of Systems: See HPI, otherwise negative ROS  Physical Exam: There were no vitals taken for this visit. General:   Alert,  pleasant and cooperative in NAD Head:  Normocephalic and atraumatic. Neck:  Supple; no masses or thyromegaly. Lungs:  Clear throughout to auscultation.    Heart:  Regular rate and rhythm. Abdomen:  Soft, nontender and nondistended. Normal bowel sounds, without guarding, and without rebound.   Neurologic:  Alert and  oriented x4;  grossly normal neurologically.  Impression/Plan: Camryn R Coderre is here for an endoscopy to be performed for gastric ulcer  Risks, benefits, limitations, and alternatives regarding  endoscopy have been reviewed with the patient.  Questions have been answered.  All parties agreeable.   Bradley Copping, MD  11/24/2023, 9:00 AM

## 2023-11-24 NOTE — Anesthesia Preprocedure Evaluation (Signed)
 Anesthesia Evaluation  Patient identified by MRN, date of birth, ID band Patient awake    Reviewed: Allergy & Precautions, NPO status , Patient's Chart, lab work & pertinent test results  History of Anesthesia Complications Negative for: history of anesthetic complications  Airway Mallampati: III  TM Distance: >3 FB Neck ROM: full    Dental  (+) Edentulous Upper, Edentulous Lower   Pulmonary shortness of breath and with exertion, neg sleep apnea, COPD,  COPD inhaler and oxygen dependent, Patient abstained from smoking.Not current smoker, former smoker    + decreased breath sounds      Cardiovascular Exercise Tolerance: Poor METShypertension, Pt. on medications + angina  + CAD and + Peripheral Vascular Disease  (-) Past MI Normal cardiovascular exam(-) dysrhythmias  Rhythm:Regular Rate:Normal - Systolic murmurs    Neuro/Psych  PSYCHIATRIC DISORDERS Anxiety Depression    CVA, No Residual Symptoms    GI/Hepatic Neg liver ROS,GERD  Medicated,,  Endo/Other  negative endocrine ROSneg diabetes    Renal/GU negative Renal ROS  negative genitourinary   Musculoskeletal   Abdominal Normal abdominal exam  (+)   Peds negative pediatric ROS (+)  Hematology negative hematology ROS (+)   Anesthesia Other Findings Past Medical History: No date: Anxiety No date: Arthritis 09/16/2014: Atherosclerotic stenosis of innominate artery No date: Cocaine dependence     Comment:  Last use 1998, attends 3 NA meetings weekly. 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 No date: Dyspnea No date: Fatty liver No date: GERD (gastroesophageal reflux disease) No date: Hx of cardiovascular stress test     Comment:  ETT/Lexiscan -Myoview (10/15):  Inferior, inferoapical,               apical  lateral defect c/w mild ischemia, EF 66%;               Intermediate Risk No date: Hyperlipidemia No date: Hypertension No date: Right shoulder pain     Comment:  2/2 partial rotator cuff tear, tendinopathy, mild               subacromial/subdeltioid bursitis, A/C joint arthropathy               per MRI done in 3/07 2014: Stroke [facility]) No date: Tobacco abuse  Past Surgical History: 12/18/2013: ANTERIOR LAT LUMBAR FUSION; N/A     Comment:  Procedure: Lumbar Two Anteriorlateral Corpectomy w/               cage, interbody fusion, anterior plating, Lumbar One to               Lumbar Three percutaneous pedicle screws;  Surgeon: Victory DELENA Gunnels, MD;  Location: [facility] NEURO ORS;  Service:               Neurosurgery;  Laterality: N/A; 10/15/2014: AORTA -INNOMIATE BYPASS; N/A     Comment:  Procedure: ENDARTERCTOMY OF  INNOMINATE ARTERY ;                Surgeon: Carlin FORBES Haddock, MD;  Location: [facility] OR;                Service: Vascular;  Laterality: N/A; 08/23/2014: ARCH AORTOGRAM; N/A     Comment:  Procedure: ARCH AORTOGRAM;  Surgeon:  Carlin FORBES Haddock,               MD;  Location: [facility] CATH LAB;  Service: Cardiovascular;                Laterality: N/A; 08/23/2014: arch aortogram, left carotid,left subclavian angiogram 08/23/2014: CAROTID ANGIOGRAM; Left     Comment:  Procedure: CAROTID ANGIOGRAM;  Surgeon: Carlin FORBES Haddock, MD;  Location: [facility] CATH LAB;  Service:               Cardiovascular;  Laterality: Left; 12/2016: CHOLECYSTECTOMY 10/15/2014: ENDARTERECTOMY; Right     Comment:  Procedure: ENDARTERECTOMY RIGHT SUBCLAVIAN ARTERY;                Surgeon: Carlin FORBES Haddock, MD;  Location: [facility] OR;                Service: Vascular;  Laterality: Right; 10/15/2014: ENDARTERECTOMY; Right     Comment:  Procedure: ENDARTERECTOMY RIGHT COMMON CAROTID ARTERY;                Surgeon: Carlin FORBES Haddock, MD;  Location: [facility] OR;                Service: Vascular;  Laterality:  Right; 08/2016: FETAL SURGERY FOR CONGENITAL HERNIA No date: HERNIA REPAIR 03/17/2021: LEFT HEART CATH AND CORONARY ANGIOGRAPHY; N/A     Comment:  Procedure: LEFT HEART CATH AND CORONARY ANGIOGRAPHY;                Surgeon: Fernand Denyse LABOR, MD;  Location: [facility] INVASIVE CV              LAB;  Service: Cardiovascular;  Laterality: N/A; 09/12/2014: LEFT HEART CATHETERIZATION WITH CORONARY ANGIOGRAM; N/A     Comment:  Procedure: LEFT HEART CATHETERIZATION WITH CORONARY               ANGIOGRAM;  Surgeon: Maude JAYSON Emmer, MD;  Location: [facility]               CATH LAB;  Service: Cardiovascular;  Laterality: N/A; 12/18/2013: LUMBAR SPINE SURGERY     Comment:  L2     DR POOL No date: NECK SURGERY 10/15/2014: PATCH ANGIOPLASTY; Right     Comment:  Procedure: PATCH ANGIOPLASTY OF RIGHT SUBCLAVIAN ARTERY               , RIGHT COMMON CAROTID ARTERY & INNOMINATE ARTERY;                Surgeon: Carlin FORBES Haddock, MD;  Location: [facility] OR;                Service: Vascular;  Laterality: Right; 10/15/2014: STERNOTOMY; N/A     Comment:  Procedure: PARTIAL STERNOTOMY & PLATING OF STERNUM;                Surgeon: Carlin FORBES Haddock, MD;  Location: [facility] OR;                Service: Vascular;  Laterality: N/A; 12/25/2013: TEE WITHOUT CARDIOVERSION; N/A     Comment:  Procedure: TRANSESOPHAGEAL ECHOCARDIOGRAM (TEE);                Surgeon: Ezra GORMAN Shuck, MD;  Location: [facility] ENDOSCOPY;                Service: Cardiovascular;  Laterality: N/A;     Reproductive/Obstetrics negative OB  ROS                             Anesthesia Physical Anesthesia Plan  ASA: 4  Anesthesia Plan: General   Post-op Pain Management: Minimal or no pain anticipated   Induction: Intravenous  PONV Risk Score and Plan: 2 and Propofol  infusion and TIVA  Airway Management Planned: Natural Airway and Nasal Cannula  Additional Equipment: None  Intra-op Plan:   Post-operative Plan:   Informed Consent: I have  reviewed the patients History and Physical, chart, labs and discussed the procedure including the risks, benefits and alternatives for the proposed anesthesia with the patient or authorized representative who has indicated his/her understanding and acceptance.     Dental Advisory Given  Plan Discussed with: CRNA and Surgeon  Anesthesia Plan Comments: (Discussed risks of anesthesia with patient, including possibility of difficulty with spontaneous ventilation under anesthesia necessitating airway intervention, PONV, and rare risks such as cardiac or respiratory or neurological events, and allergic reactions. Discussed the role of CRNA in patient's perioperative care. Patient understands. Patient counseled on being higher risk for anesthesia due to comorbidities: O2-dependent COPD. Patient was told about increased risk of cardiac and respiratory events, including death. )       Anesthesia Quick Evaluation

## 2023-11-24 NOTE — Op Note (Signed)
 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 Corrington, MD (Referring MD) Medicines:             Propofol  per Anesthesia Complications:         No immediate complications. Procedure:             Pre-Anesthesia Assessment:                        - Prior to the procedure, a History and Physical was                         performed, and patient medications and allergies were                         reviewed. The patient's tolerance of previous                         anesthesia was also reviewed. The risks and benefits                         of the procedure and the sedation options and risks                         were discussed with the patient. All questions were                         answered, and informed consent was obtained. Prior                         Anticoagulants: The patient has taken no anticoagulant                         or antiplatelet agents. ASA Grade Assessment: II - A                         patient with mild systemic disease. After reviewing                         the risks and benefits, the patient was deemed in                         satisfactory condition to undergo the procedure.                        After obtaining informed consent, the endoscope was                         passed under direct vision. Throughout the procedure,  the patient's blood pressure, pulse, and oxygen                         saturations were monitored continuously. The Endoscope                         was introduced through  the mouth, and advanced to the                         second part of duodenum. The upper GI endoscopy was                         accomplished without difficulty. The patient tolerated                         the procedure well. Findings:      The examined esophagus was normal.      The stomach was normal.      The examined duodenum was normal. Impression:            - Normal esophagus.                        - Normal stomach.                        - Normal examined duodenum.                        - No specimens collected. Recommendation:        - Discharge patient to home.                        - Resume previous diet.                        - Continue present medications. 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 All rights reserved. The codes documented in this report are preliminary and upon coder review may  be revised to meet current compliance requirements. Rogelia Copping MD, MD 11/24/2023 9:25:54 AM This report has been signed electronically. Number of Addenda: 0 Note Initiated On: 11/24/2023 8:25 AM Estimated Blood Loss:  Estimated  blood loss: none.      [facility]

## 2023-11-24 NOTE — Anesthesia Postprocedure Evaluation (Signed)
 Anesthesia Post Note  Patient: Bradley Sawyer  Procedure(s) Performed: ESOPHAGOGASTRODUODENOSCOPY (EGD) WITH PROPOFOL   Patient location during evaluation: Endoscopy Anesthesia Type: General Level of consciousness: awake and alert Pain management: pain level controlled Vital Signs Assessment: post-procedure vital signs reviewed and stable Respiratory status: spontaneous breathing, nonlabored ventilation, respiratory function stable and patient connected to nasal cannula oxygen Cardiovascular status: blood pressure returned to baseline and stable Postop Assessment: no apparent nausea or vomiting Anesthetic complications: no   No notable events documented.   Last Vitals:  Vitals:   11/24/23 0939 11/24/23 0949  BP: 100/68 110/74  Pulse: (!) 59 61  Resp:    Temp:    SpO2: 98% 98%    Last Pain:  Vitals:   11/24/23 0949  TempSrc:   PainSc: 0-No pain                 [name]

## 2023-11-24 NOTE — Transfer of Care (Signed)
 Immediate Anesthesia Transfer of Care Note  Patient: Bradley Sawyer  Procedure(s) Performed: ESOPHAGOGASTRODUODENOSCOPY (EGD) WITH PROPOFOL   Patient Location: Endoscopy Unit  Anesthesia Type:General  Level of Consciousness: awake, alert , and drowsy  Airway & Oxygen Therapy: Patient Spontanous Breathing and Patient connected to nasal cannula oxygen  Post-op Assessment: Report given to RN and Post -op Vital signs reviewed and stable  Post vital signs: Reviewed and stable  Last Vitals:  Vitals Value Taken Time  BP 89/64 11/24/23 0933  Temp 36.1 C 11/24/23 0929  Pulse 57 11/24/23 0933  Resp 13 11/24/23 0933  SpO2 97 % 11/24/23 0933  Vitals shown include unfiled device data.  Last Pain:  Vitals:   11/24/23 0929  TempSrc: Temporal         Complications: No notable events documented.

## 2023-11-25 ENCOUNTER — Encounter: Payer: Self-pay | Admitting: Gastroenterology

## 2023-11-29 ENCOUNTER — Telehealth: Payer: Self-pay | Admitting: Pulmonary Disease

## 2023-11-29 NOTE — Telephone Encounter (Signed)
 Dr. Jayme Cloud does this patient still need to have a PFT he has been scheduled twice before and no showed for both appts. He is now scheduled to see you on 12/21/23.

## 2023-11-29 NOTE — Telephone Encounter (Signed)
 Yes he needs the PFT

## 2023-12-01 ENCOUNTER — Other Ambulatory Visit: Payer: Self-pay

## 2023-12-05 NOTE — Telephone Encounter (Signed)
 I have spoke with the patient and his PFT has been scheduled on 12/08/2023 @ 11:00am at [facility] Entrance

## 2023-12-06 ENCOUNTER — Other Ambulatory Visit: Payer: Self-pay

## 2023-12-07 ENCOUNTER — Emergency Department
Admission: EM | Admit: 2023-12-07 | Discharge: 2023-12-07 | Disposition: A | Discharge status: Home / Self Care | Payer: Medicare HMO | Attending: Emergency Medicine | Admitting: Emergency Medicine

## 2023-12-07 ENCOUNTER — Other Ambulatory Visit: Payer: Self-pay

## 2023-12-07 ENCOUNTER — Emergency Department: Payer: Medicare HMO

## 2023-12-07 DIAGNOSIS — E876 Hypokalemia: Secondary | ICD-10-CM | POA: Diagnosis not present

## 2023-12-07 DIAGNOSIS — N3090 Cystitis, unspecified without hematuria: Secondary | ICD-10-CM | POA: Diagnosis not present

## 2023-12-07 DIAGNOSIS — D72829 Elevated white blood cell count, unspecified: Secondary | ICD-10-CM | POA: Insufficient documentation

## 2023-12-07 DIAGNOSIS — M545 Low back pain, unspecified: Secondary | ICD-10-CM | POA: Insufficient documentation

## 2023-12-07 DIAGNOSIS — J441 Chronic obstructive pulmonary disease with (acute) exacerbation: Secondary | ICD-10-CM | POA: Insufficient documentation

## 2023-12-07 DIAGNOSIS — R350 Frequency of micturition: Secondary | ICD-10-CM | POA: Diagnosis present

## 2023-12-07 LAB — COMPREHENSIVE METABOLIC PANEL
ALT: 17 U/L (ref 0–44)
AST: 17 U/L (ref 15–41)
Albumin: 3.2 g/dL — ABNORMAL LOW (ref 3.5–5.0)
Alkaline Phosphatase: 93 U/L (ref 38–126)
Anion gap: 12 (ref 5–15)
BUN: 9 mg/dL (ref 8–23)
CO2: 31 mmol/L (ref 22–32)
Calcium: 8.8 mg/dL — ABNORMAL LOW (ref 8.9–10.3)
Chloride: 93 mmol/L — ABNORMAL LOW (ref 98–111)
Creatinine, Ser: 0.89 mg/dL (ref 0.61–1.24)
GFR, Estimated: >60 mL/min (ref >60)
Glucose, Bld: 103 mg/dL — ABNORMAL HIGH (ref 70–99)
Potassium: 2.8 mmol/L — ABNORMAL LOW (ref 3.5–5.1)
Sodium: 136 mmol/L (ref 135–145)
Total Bilirubin: 0.5 mg/dL (ref 0.0–1.2)
Total Protein: 7.6 g/dL (ref 6.5–8.1)

## 2023-12-07 LAB — CBC WITH DIFFERENTIAL/PLATELET
Abs Immature Granulocytes: 0.08 10*3/uL — ABNORMAL HIGH (ref 0.00–0.07)
Basophils Absolute: 0 10*3/uL (ref 0.0–0.1)
Basophils Relative: 0 %
Eosinophils Absolute: 0.1 10*3/uL (ref 0.0–0.5)
Eosinophils Relative: 1 %
HCT: 31.2 % — ABNORMAL LOW (ref 39.0–52.0)
Hemoglobin: 9.9 g/dL — ABNORMAL LOW (ref 13.0–17.0)
Immature Granulocytes: 1 %
Lymphocytes Relative: 11 %
Lymphs Abs: 1.7 10*3/uL (ref 0.7–4.0)
MCH: 28.4 pg (ref 26.0–34.0)
MCHC: 31.7 g/dL (ref 30.0–36.0)
MCV: 89.7 fL (ref 80.0–100.0)
Monocytes Absolute: 1 10*3/uL (ref 0.1–1.0)
Monocytes Relative: 7 %
Neutro Abs: 12.6 10*3/uL — ABNORMAL HIGH (ref 1.7–7.7)
Neutrophils Relative %: 80 %
Platelets: 358 10*3/uL (ref 150–400)
RBC: 3.48 MIL/uL — ABNORMAL LOW (ref 4.22–5.81)
RDW: 13.1 % (ref 11.5–15.5)
WBC: 15.6 10*3/uL — ABNORMAL HIGH (ref 4.0–10.5)
nRBC: 0 % (ref 0.0–0.2)

## 2023-12-07 LAB — URINALYSIS, ROUTINE W REFLEX MICROSCOPIC
Bilirubin Urine: NEGATIVE
Glucose, UA: NEGATIVE mg/dL
Ketones, ur: NEGATIVE mg/dL
Nitrite: NEGATIVE
Protein, ur: 30 mg/dL — AB
Specific Gravity, Urine: 1.016 (ref 1.005–1.030)
WBC, UA: >50 WBC/hpf (ref 0–5)
pH: 6 (ref 5.0–8.0)

## 2023-12-07 MED ORDER — HYDROMORPHONE HCL 1 MG/ML IJ SOLN
1.0000 mg | Freq: Once | INTRAMUSCULAR | Status: AC
  Administered 2023-12-07: 1 mg via INTRAMUSCULAR
  Filled 2023-12-07: qty 1

## 2023-12-07 MED ORDER — HYDROMORPHONE HCL 1 MG/ML IJ SOLN
1.0000 mg | Freq: Once | INTRAMUSCULAR | Status: AC
  Administered 2023-12-07: 1 mg via INTRAVENOUS
  Filled 2023-12-07: qty 1

## 2023-12-07 MED ORDER — PREDNISONE 20 MG PO TABS: 40.0000 mg | ORAL_TABLET | Freq: Every day | ORAL | 0 refills | Status: AC

## 2023-12-07 MED ORDER — METHYLPREDNISOLONE SODIUM SUCC 125 MG IJ SOLR
125.0000 mg | INTRAMUSCULAR | Status: AC
  Administered 2023-12-07: 125 mg via INTRAVENOUS
  Filled 2023-12-07: qty 2

## 2023-12-07 MED ORDER — IPRATROPIUM-ALBUTEROL 0.5-2.5 (3) MG/3ML IN SOLN
3.0000 mL | Freq: Once | RESPIRATORY_TRACT | Status: AC
  Administered 2023-12-07: 3 mL via RESPIRATORY_TRACT
  Filled 2023-12-07: qty 3

## 2023-12-07 MED ORDER — CEFDINIR 300 MG PO CAPS
300.0000 mg | ORAL_CAPSULE | Freq: Two times a day (BID) | ORAL | 0 refills | Status: DC
Start: 2023-12-07 — End: 2024-08-07

## 2023-12-07 MED ORDER — SODIUM CHLORIDE 0.9 % IV SOLN
2.0000 g | Freq: Once | INTRAVENOUS | Status: AC
  Administered 2023-12-07: 2 g via INTRAVENOUS
  Filled 2023-12-07: qty 20

## 2023-12-07 NOTE — ED Provider Triage Note (Signed)
Emergency Medicine Provider Triage Evaluation Note  CERVANDO RAIRIGH , a 68 y.o. male  was evaluated in triage.  Pt complains of back pain and possible kidney problems.  Review of Systems  Positive: Abdominal pain. Bialteral flank pain.  Chronic back pain Negative: No n,v,d,  no fever or chills  Physical Exam  BP (!) 151/85 (BP Location: Right Arm)   Pulse 85   Temp 97.8 F (36.6 C) (Oral)   Resp 16   SpO2 97%  Gen:   Awake, no distress.  Appears uncomfortable.   Resp:  Normal effort  MSK:   Moves extremities without difficulty  Other:    Medical Decision Making  Medically screening exam initiated at 10:41 AM.  Appropriate orders placed.  GARVIN COPUS was informed that the remainder of the evaluation will be completed by another provider, this initial triage assessment does not replace that evaluation, and the importance of remaining in the ED until their evaluation is complete.     Tommi Rumps, PA-C 12/07/23 1044

## 2023-12-07 NOTE — ED Provider Notes (Signed)
-----------------------------------------   5:02 PM on 12/07/2023 ----------------------------------------- Patient's lab work shows a slight leukocytosis otherwise reassuring.  Chemistry is reassuring besides mild hypokalemia.  Patient's urinalysis does show concerns for possible urinary tract infection.  CT scan shows no acute finding.  Discussed aortic follow-up in 3 years.   Ruth Cove, MD 12/07/23 2310

## 2023-12-07 NOTE — ED Triage Notes (Signed)
 Pt c/o bilateral flank pain for the past few days. Pt also has chronic back pain and sees a pain clinic for pain management.

## 2023-12-07 NOTE — ED Provider Notes (Signed)
 [facility] Provider Note    Event Date/Time   First MD Initiated Contact with Patient 12/07/23 1256     (approximate)   History   Chief Complaint: Back Pain   HPI  Bradley Sawyer is a 68 y.o. male with a history of high retention, COPD, GERD, stroke, chronic pain syndrome under care of pain management who comes ED complaining of bilateral low back pain for the last several days.  No dysuria but does have some frequency.  Has a history of UTI.  No falls or trauma no fever no chest pain no shortness of breath.          Physical Exam   Triage Vital Signs: ED Triage Vitals  Encounter Vitals Group     BP 12/07/23 1039 (!) 151/85     Systolic BP Percentile --      Diastolic BP Percentile --      Pulse Rate 12/07/23 1039 85     Resp 12/07/23 1039 16     Temp 12/07/23 1039 97.8 F (36.6 C)     Temp Source 12/07/23 1039 Oral     SpO2 12/07/23 1039 97 %     Weight 12/07/23 1041 203 lb (92.1 kg)     Height 12/07/23 1041 5\' 11"  (1.803 m)     Head Circumference --      Peak Flow --      Pain Score 12/07/23 1040 9     Pain Loc --      Pain Education --      Exclude from Growth Chart --     Most recent vital signs: Vitals:   12/07/23 1039  BP: (!) 151/85  Pulse: 85  Resp: 16  Temp: 97.8 F (36.6 C)  SpO2: 97%    General: Awake, no distress.  CV:  Good peripheral perfusion.  Regular rate rhythm Resp:  Normal effort.  Good air movement in all lung fields.  There is diffuse expiratory wheezing and mildly prolonged expiratory phase which is accentuated with FEV1 maneuver. Abd:  No distention.  Soft, nontender.  No CVA tenderness Other:  Diffuse tenderness in the lumbar musculature bilaterally.  No rash   ED Results / Procedures / Treatments   Labs (all labs ordered are listed, but only abnormal results are displayed) Labs Reviewed  COMPREHENSIVE METABOLIC PANEL - Abnormal; Notable for the following components:      Result Value    Potassium 2.8 (*)    Chloride 93 (*)    Glucose, Bld 103 (*)    Calcium  8.8 (*)    Albumin 3.2 (*)    All other components within normal limits  CBC WITH DIFFERENTIAL/PLATELET - Abnormal; Notable for the following components:   WBC 15.6 (*)    RBC 3.48 (*)    Hemoglobin 9.9 (*)    HCT 31.2 (*)    Neutro Abs 12.6 (*)    Abs Immature Granulocytes 0.08 (*)    All other components within normal limits  URINALYSIS, ROUTINE W REFLEX MICROSCOPIC - Abnormal; Notable for the following components:   Color, Urine AMBER (*)    APPearance CLOUDY (*)    Hgb urine dipstick SMALL (*)    Protein, ur 30 (*)    Leukocytes,Ua LARGE (*)    Bacteria, UA RARE (*)    All other components within normal limits     EKG    RADIOLOGY CT abdomen pelvis interpreted by me, negative for ureterolithiasis/obstruction, no free air or  bowel obstruction.  Radiology report reviewed   PROCEDURES:  Procedures   MEDICATIONS ORDERED IN ED: Medications  cefTRIAXone  (ROCEPHIN ) 2 g in sodium chloride  0.9 % 100 mL IVPB (has no administration in time range)  HYDROmorphone  (DILAUDID ) injection 1 mg (has no administration in time range)  methylPREDNISolone  sodium succinate (SOLU-MEDROL ) 125 mg/2 mL injection 125 mg (has no administration in time range)  ipratropium-albuterol  (DUONEB) 0.5-2.5 (3) MG/3ML nebulizer solution 3 mL (has no administration in time range)  HYDROmorphone  (DILAUDID ) injection 1 mg (1 mg Intramuscular Given 12/07/23 1353)     IMPRESSION / MDM / ASSESSMENT AND PLAN / ED COURSE  I reviewed the triage vital signs and the nursing notes.  DDx: AKI, electrolyte derangement, UTI, kidney stone, acute on chronic pain, viral illness  Patient's presentation is most consistent with acute presentation with potential threat to life or bodily function.  Patient presents with worsening of his chronic lumbar back pain which appears musculoskeletal on exam.  Doubt spinal fracture, osteomyelitis, or cauda  equina/central cord syndrome.  Urinalysis reveals UTI, CBC reveals a leukocytosis of 15,000.  CT looks unremarkable.  Will give IV Rocephin  and start on Omnicef  for UTI management.  Patient still awaiting CMP results and radiology CT read, after which I expect he will be stable for discharge.       FINAL CLINICAL IMPRESSION(S) / ED DIAGNOSES   Final diagnoses:  Cystitis  COPD exacerbation (HCC)     Rx / DC Orders   ED Discharge Orders          Ordered    cefdinir  (OMNICEF ) 300 MG capsule  2 times daily        12/07/23 1443    predniSONE  (DELTASONE ) 20 MG tablet  Daily with breakfast        12/07/23 1443             Note:  This document was prepared using Dragon voice recognition software and may include unintentional dictation errors.   Jacquie Maudlin, MD 12/07/23 [phone]

## 2023-12-07 NOTE — Discharge Instructions (Addendum)
 As we discussed please follow-up with your doctor/pain management for ongoing pain control.  Please take antibiotic as prescribed for its entire course.  Return to the emergency department for any symptom personally concerning to yourself.

## 2023-12-08 ENCOUNTER — Ambulatory Visit: Payer: Medicare HMO

## 2023-12-14 ENCOUNTER — Other Ambulatory Visit (HOSPITAL_BASED_OUTPATIENT_CLINIC_OR_DEPARTMENT_OTHER): Payer: Self-pay

## 2023-12-21 ENCOUNTER — Ambulatory Visit: Payer: Medicare HMO | Admitting: Pulmonary Disease

## 2024-01-03 ENCOUNTER — Other Ambulatory Visit: Payer: Self-pay | Admitting: Cardiovascular Disease

## 2024-01-03 DIAGNOSIS — I10 Essential (primary) hypertension: Secondary | ICD-10-CM

## 2024-01-03 DIAGNOSIS — I2510 Atherosclerotic heart disease of native coronary artery without angina pectoris: Secondary | ICD-10-CM

## 2024-01-03 DIAGNOSIS — K2100 Gastro-esophageal reflux disease with esophagitis, without bleeding: Secondary | ICD-10-CM

## 2024-01-13 ENCOUNTER — Ambulatory Visit: Payer: Medicare HMO | Admitting: Pulmonary Disease

## 2024-01-30 ENCOUNTER — Emergency Department
Admission: EM | Admit: 2024-01-30 | Discharge: 2024-01-30 | Discharge status: Against Medical Advice | Source: Home / Self Care

## 2024-02-24 ENCOUNTER — Other Ambulatory Visit: Payer: Self-pay

## 2024-02-24 NOTE — Progress Notes (Signed)
 Patient no-show to two appointments with Dr. Jayme Cloud. Disenrolled from specialty follow-up. Message sent to scheduling team at [facility].   Chesley Mires, PharmD, MPH, BCPS, CPP Clinical Pharmacist (Rheumatology and Pulmonology)

## 2024-02-24 NOTE — Progress Notes (Signed)
 Reaching out to clinic to confirm if patient is still taking dupixent. Medication last filled 12.17.24

## 2024-03-01 ENCOUNTER — Ambulatory Visit: Admitting: Pulmonary Disease

## 2024-03-01 ENCOUNTER — Encounter: Payer: Self-pay | Admitting: Pulmonary Disease

## 2024-03-01 VITALS — BP 116/86 | HR 79 | Temp 97.7°F | Ht 71.0 in | Wt 195.8 lb

## 2024-03-01 DIAGNOSIS — J9611 Chronic respiratory failure with hypoxia: Secondary | ICD-10-CM

## 2024-03-01 DIAGNOSIS — J441 Chronic obstructive pulmonary disease with (acute) exacerbation: Secondary | ICD-10-CM

## 2024-03-01 DIAGNOSIS — J4489 Other specified chronic obstructive pulmonary disease: Secondary | ICD-10-CM

## 2024-03-01 MED ORDER — FLUTICASONE-SALMETEROL 250-50 MCG/ACT IN AEPB
1.0000 | INHALATION_SPRAY | Freq: Two times a day (BID) | RESPIRATORY_TRACT | 11 refills | Status: DC
Start: 2024-03-01 — End: 2024-06-01

## 2024-03-01 MED ORDER — IPRATROPIUM-ALBUTEROL 0.5-2.5 (3) MG/3ML IN SOLN: 3.0000 mL | Freq: Four times a day (QID) | RESPIRATORY_TRACT | 6 refills | Status: AC | PRN

## 2024-03-01 MED ORDER — IPRATROPIUM-ALBUTEROL 0.5-2.5 (3) MG/3ML IN SOLN
3.0000 mL | Freq: Once | RESPIRATORY_TRACT | Status: AC
  Administered 2024-03-01: 3 mL via RESPIRATORY_TRACT

## 2024-03-01 MED ORDER — AZITHROMYCIN 500 MG PO TABS: 500.0000 mg | ORAL_TABLET | Freq: Every day | ORAL | 0 refills | Status: AC

## 2024-03-01 MED ORDER — METHYLPREDNISOLONE 4 MG PO TBPK: ORAL_TABLET | ORAL | 0 refills | Status: DC

## 2024-03-01 NOTE — Progress Notes (Signed)
 Subjective:    Patient ID: Bradley Sawyer, male    DOB: Feb 27, 1956, 68 y.o.   MRN: [id number]  Patient Care Team: [name], MD as PCP - General (Family Medicine) Salena Saner, MD as Consulting Physician (Pulmonary Disease)  Chief Complaint  Patient presents with   Acute Visit    Cough with thick white phlegm, shortness of breath and wheezing.     BACKGROUND/INTERVAL:Bradley Sawyer is a 68 year old former smoker, with problems as noted below, who presents for follow-up on the issue of severe COPD, stage III, with asthma overlap.  Asthma is moderate persistent with seasonal variation.  Patient also has chronic respiratory failure with hypoxia requiring 3 L/min of oxygen.  Patient presents today for an acute visit due to increasing shortness of breath, last seen on 19 September 2023.  Has been out of Dupixent since January.  He has been also out of Breztri due to inability to afford.  He did not contact our clinic with these concerns.  He missed his follow-up appointment after that visit.  HPI Discussed the use of AI scribe software for clinical note transcription with the patient, who gave verbal consent to proceed.  History of Present Illness   Bradley Sawyer is a 68 year old male with COPD who presents with worsening respiratory symptoms and anxiety. He is accompanied by his daughter, who takes him to the emergency room during severe anxiety episodes.  He experiences worsening respiratory symptoms, including increased dyspnea and production of white sputum described as 'like plugs'. He notes a sensation of 'coldness' in his breath. His inhalers are less effective, and he recently contemplated visiting the emergency room due to breathing difficulties, though the symptoms eventually subsided.  He faces challenges in refilling his inhalers due to insurance coverage issues. He managed to purchase his rescue inhaler but finds Breztri unaffordable. He has not received Dupixent  injections since January, which he believes were beneficial. He previously used a purple disk inhaler (Advair), which he felt was more effective, and has used nebulizers in the past, covered by Medicare Part B. He was recently prescribed an antibiotic and prednisone.  He experiences significant anxiety, which exacerbates his respiratory difficulties. His anxiety is described as 'off the chart', prompting consideration of emergency room visits during severe episodes.  He has not had any fevers, chills or sweats.  Cough is productive of yellow to greenish sputum.  No hemoptysis.  He recently transitioned from living in a camper next to his son's house back into the house.     Review of Systems A 10 point review of systems was performed and it is as noted above otherwise negative.   Patient Active Problem List   Diagnosis Date Noted   Polyp of colon 09/14/2023   History of colonic polyps 09/14/2023   Chest pain 08/26/2023   AKI (acute kidney injury) (HCC) 08/26/2023   Hypotension 08/26/2023   Leucocytosis 08/26/2023   BPH (benign prostatic hyperplasia) 08/26/2023   Depression with anxiety 08/26/2023   Chronic respiratory failure with hypoxia (HCC) 03/30/2021   COPD exacerbation (HCC) 02/27/2021   History of CVA (cerebrovascular accident) 02/27/2021   Chronic pain, radicular 02/27/2021   Chronic, continuous use of opioids 02/27/2021   Unstable angina (HCC) 02/19/2021   Cigarette nicotine dependence without complication 05/24/2018   Acute lower UTI 02/19/2017   UTI (urinary tract infection) 02/19/2017   Left inguinal hernia 08/12/2016   Elevated troponin 05/08/2016   Anal fissure 03/15/2016   External hemorrhoid  03/15/2016   Acute viral pharyngitis 03/20/2015   Olecranon bursitis of left elbow 12/23/2014   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   Carotid stenosis 10/15/2014   Stenosis of right subclavian artery (HCC) 09/19/2014   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    PAD (peripheral artery disease) (HCC) 08/23/2014   Preventative health care 08/12/2014   Occlusion and stenosis of carotid artery without mention of cerebral infarction 07/25/2014   Subclavian steal syndrome 07/25/2014   CAD (coronary artery disease) 07/10/2014   Nocturia 04/17/2014   Testicular pain, right 04/17/2014   Urethral stricture 04/17/2014   Cerebral infarction (HCC) 04/10/2014   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   Hematuria 08/06/2013   Compression fracture of L2, with back and right groin pain 07/18/2013   History of colon polyps 05/18/2012   COPD (chronic obstructive pulmonary disease) (HCC) 12/24/2009   Hyperlipemia 09/20/2006   TOBACCO ABUSE 09/20/2006   Essential hypertension 09/20/2006   GERD 09/20/2006    Social History   Tobacco Use   Smoking status: Former    Current packs/day: 0.00    Average packs/day: 2.0 packs/day for 48.0 years (96.0 ttl pk-yrs)    Types: Cigarettes    Start date: 11/22/1972    Quit date: 11/22/2020    Years since quitting: 3.2   Smokeless tobacco: Never  Substance Use Topics   Alcohol use: No    Alcohol/week: 0.0 standard drinks of alcohol    Allergies  Allergen Reactions   Bee Venom Swelling    Current Meds  Medication Sig   albuterol (VENTOLIN HFA) 108 (90 Base) MCG/ACT inhaler INHALE 2 PUFFS BY MOUTH EVERY 4-6 HOURS AS NEEDED FOR WHEEZING   aspirin EC 81 MG tablet Take 81 mg by mouth daily.   atorvastatin (LIPITOR) 80 MG tablet TAKE ONE TABLET BY MOUTH ONCE DAILY AT BEDTIME   azithromycin (ZITHROMAX) 500 MG tablet Take 1 tablet (500 mg total) by mouth daily for 3 days. Take 1 tablet daily for 3 days.   cefdinir (OMNICEF) 300 MG capsule Take 1 capsule (300 mg total) by mouth 2 (two) times daily.   clopidogrel (PLAVIX) 75 MG tablet TAKE ONE TABLET BY MOUTH ONCE DAILY   EPINEPHrine 0.3 mg/0.3 mL IJ SOAJ injection Inject 0.3 mg into the muscle as needed for anaphylaxis.   famotidine (PEPCID) 40 MG  tablet TAKE ONE TABLET BY MOUTH ONCE DAILY   FLUoxetine (PROZAC) 20 MG capsule Take 20 mg by mouth as needed.   FLUoxetine (PROZAC) 40 MG capsule Take 40 mg by mouth daily.   fluticasone-salmeterol (ADVAIR DISKUS) 250-50 MCG/ACT AEPB Inhale 1 puff into the lungs in the morning and at bedtime.   furosemide (LASIX) 20 MG tablet TAKE ONE TABLET BY MOUTH EVERY DAY   ipratropium-albuterol (DUONEB) 0.5-2.5 (3) MG/3ML SOLN Take 3 mLs by nebulization every 6 (six) hours as needed.   isosorbide mononitrate (IMDUR) 30 MG 24 hr tablet Take 30 mg by mouth daily.   levocetirizine (XYZAL) 5 MG tablet Take 5 mg by mouth at bedtime.   linaclotide (LINZESS) 290 MCG CAPS capsule Take 290 mcg by mouth daily before breakfast.   lisinopril-hydrochlorothiazide (ZESTORETIC) 20-25 MG tablet TAKE ONE TABLET BY MOUTH ONCE DAILY   meloxicam (MOBIC) 15 MG tablet Take 15 mg by mouth daily.   metFORMIN (GLUCOPHAGE) 500 MG tablet Take 500 mg by mouth daily.   methylPREDNISolone (MEDROL DOSEPAK) 4 MG TBPK tablet Take as directed in the package, this is a taper pack.   montelukast (  SINGULAIR) 10 MG tablet Take 10 mg by mouth daily.   Multiple Vitamin (MULTI-VITAMINS) TABS Take 1 tablet by mouth daily.   naloxone (NARCAN) nasal spray 4 mg/0.1 mL Place 0.4 mg into the nose once.   nitroGLYCERIN (NITROSTAT) 0.4 MG SL tablet DISSOLVE (1) TABLET UNDER TONGUE AS NEEDED TO RELIEVE CHEST PAIN. MAY REPEAT EVERY 5 MINUTES.   ondansetron (ZOFRAN) 4 MG tablet Take by mouth.   oxyCODONE (ROXICODONE) 15 MG immediate release tablet Take 15 mg by mouth.   OXYGEN Inhale 3 L into the lungs.   pantoprazole (PROTONIX) 40 MG tablet TAKE ONE TABLET BY MOUTH TWICE DAILY BEFORE A MEAL.   polyethylene glycol (MIRALAX / GLYCOLAX) packet Take 17 g by mouth daily as needed (for constipation).   pregabalin (LYRICA) 150 MG capsule Take 150 mg by mouth daily.   rOPINIRole (REQUIP) 1 MG tablet Take 1 tablet by mouth at bedtime.   spironolactone (ALDACTONE)  25 MG tablet TAKE ONE TABLET BY MOUTH ONCE DAILY   tamsulosin (FLOMAX) 0.4 MG CAPS capsule TAKE ONE CAPSULE BY MOUTH EVERY DAY   [DISCONTINUED] albuterol (PROVENTIL) (2.5 MG/3ML) 0.083% nebulizer solution USE 1 AMPULE IN NEBULIZER EVERY 6 HOURS AS NEEDED.   [DISCONTINUED] BREZTRI AEROSPHERE 160-9-4.8 MCG/ACT AERO INHALE TWO PUFFS into THE lungs TWICE DAILY morning & bedtime    Immunization History  Administered Date(s) Administered   DTaP 12/04/2018   Fluad Quad(high Dose 65+) 10/08/2021   Influenza, High Dose Seasonal PF 10/08/2021   Influenza, Seasonal, Injecte, Preservative Fre 08/01/2013, 08/12/2014, 08/26/2015, 08/06/2016, 09/26/2019   Influenza,inj,Quad PF,6+ Mos 08/01/2013, 08/12/2014   Influenza-Unspecified 10/30/2018, 09/26/2019, 09/10/2020   Moderna Sars-Covid-2 Vaccination 02/06/2020, 03/05/2020, 12/12/2020   PNEUMOCOCCAL CONJUGATE-20 01/27/2022   Pneumococcal Polysaccharide-23 10/20/2014   Pneumococcal-Unspecified 10/20/2014        Objective:     BP 116/86 (BP Location: Left Arm, Patient Position: Sitting, Cuff Size: Normal)   Pulse 79   Temp 97.7 F (36.5 C) (Temporal)   Ht 5\' 11"  (1.803 m)   Wt 195 lb 12.8 oz (88.8 kg)   SpO2 95% Comment: 3L continous oxygen  BMI 27.31 kg/m   SpO2: 95 % (3L continous oxygen)  GENERAL: Well-developed, overweight gentleman, no acute distress.  HEAD: Normocephalic, atraumatic. EYES: Pupils equal, round, reactive to light.  No scleral icterus. MOUTH: Edentulous, oral mucosa moist, no thrush. NECK: Supple. No thyromegaly. Trachea midline. No JVD.  No adenopathy. PULMONARY: Poor air movement bilaterally.  Scattered wheezes and rhonchi noted. CARDIOVASCULAR: S1 and S2. Regular rate and rhythm.  No rubs, murmurs or gallops heard. ABDOMEN: Benign. MUSCULOSKELETAL: No joint deformity, no clubbing, no edema. NEUROLOGIC: No focal deficits, no gait disturbance.  Speech is fluent. SKIN: Intact,warm,dry. PSYCH: Mood and behavior  normal.  Patient received nebulization treatment with DuoNeb x 1: After therapy, bronchospasm resolved and air entry had improved.  Patient noted improvement.      Assessment & Plan:     ICD-10-CM   1. COPD with acute exacerbation (HCC)  J44.1 ipratropium-albuterol (DUONEB) 0.5-2.5 (3) MG/3ML nebulizer solution 3 mL    2. COPD with asthma (HCC)  J44.89     3. Chronic respiratory failure with hypoxia (HCC)  J96.11       Meds ordered this encounter  Medications   azithromycin (ZITHROMAX) 500 MG tablet    Sig: Take 1 tablet (500 mg total) by mouth daily for 3 days. Take 1 tablet daily for 3 days.    Dispense:  3 tablet    Refill:  0   methylPREDNISolone (MEDROL DOSEPAK) 4 MG TBPK tablet    Sig: Take as directed in the package, this is a taper pack.    Dispense:  21 tablet    Refill:  0   ipratropium-albuterol (DUONEB) 0.5-2.5 (3) MG/3ML SOLN    Sig: Take 3 mLs by nebulization every 6 (six) hours as needed.    Dispense:  360 mL    Refill:  6   fluticasone-salmeterol (ADVAIR DISKUS) 250-50 MCG/ACT AEPB    Sig: Inhale 1 puff into the lungs in the morning and at bedtime.    Dispense:  60 each    Refill:  11   ipratropium-albuterol (DUONEB) 0.5-2.5 (3) MG/3ML nebulizer solution 3 mL   Assessment and Plan    Chronic Obstructive Pulmonary Disease (COPD) exacerbation   Experiencing a COPD exacerbation with increased anxiety, dyspnea, and ineffective inhaler use. Exacerbation severe enough to consider emergency room visit. Reports productive cough with white sputum. Current inhalers not providing adequate relief, financial barrier to accessing [place]. Previously responded well to Advair Diskus and nebulizer treatments. Plan to address exacerbation with combination of medications and ensure access to necessary treatments. - - - Administer nebulizer treatment with DuoNeb in-office.   - Prescribe an antibiotic and prednisone to manage the exacerbation.   - Prescribe a generic version of  Advair Diskus 250/50, 1 inhalation twice a day.   - Prescribe nebulizer medication (DuoNeb) for home use, up to four times a day as needed.   - Investigate issues with Dupixent prescription and coordinate with the specialty pharmacy.    Anxiety   Reports significant anxiety exacerbating dyspnea. Anxiety episodes severe enough to consider emergency room visits. Acknowledged impact of anxiety on respiratory condition and plan to manage as part of overall treatment strategy.   - Monitor anxiety levels and his impact on respiratory symptoms.   - Encourage seeking emergency care if anxiety severely impacts breathing.     Advised if symptoms do not improve or worsen, to please contact office for sooner follow up or seek emergency care.    I spent 60 minutes of dedicated to the care of this patient on the date of this encounter to include pre-visit review of records, face-to-face time with the patient discussing conditions above, post visit ordering of testing, clinical documentation with the electronic health record, making appropriate referrals as documented, and communicating necessary findings to members of the patients care team.   C. Danice Goltz, MD Advanced Bronchoscopy PCCM [facility]    *This note was generated using voice recognition software/Dragon and/or AI transcription program.  Despite best efforts to proofread, errors can occur which can change the meaning. Any transcriptional errors that result from this process are unintentional and may not be fully corrected at the time of dictation.

## 2024-03-01 NOTE — Patient Instructions (Signed)
 VISIT SUMMARY:  Today, you were seen for worsening respiratory symptoms related to your COPD and significant anxiety. You reported increased shortness of breath, production of white sputum, and less effective use of your inhalers. You also mentioned challenges in refilling your inhalers due to insurance issues and have not received your Dupixent injections since January. Your anxiety has been severe, often making your breathing difficulties worse.  YOUR PLAN:  -CHRONIC OBSTRUCTIVE PULMONARY DISEASE (COPD) EXACERBATION: COPD exacerbation means a worsening of your chronic lung condition, leading to increased breathing difficulties and other symptoms. To manage this, you received a nebulizer treatment in the office (DuoNeb) and were prescribed an antibiotic and prednisone. You will also start using a generic version of Advair Diskus and nebulizer medication at home up to four times a day as needed. We will investigate the issues with your Dupixent prescription and coordinate with the specialty pharmacy.  -ANXIETY: Anxiety can significantly impact your breathing and overall well-being. We will monitor your anxiety levels and their effect on your respiratory symptoms. If your anxiety severely impacts your breathing, you are encouraged to seek emergency care.  INSTRUCTIONS:  Please follow up with the specialty pharmacy regarding your Dupixent prescription. Use the prescribed medications as directed and monitor your symptoms closely. If your anxiety or breathing difficulties worsen, seek emergency care immediately.  Will see you in follow-up in 3 to 4 weeks time.

## 2024-03-02 ENCOUNTER — Other Ambulatory Visit (HOSPITAL_COMMUNITY): Payer: Self-pay

## 2024-03-02 ENCOUNTER — Telehealth: Payer: Self-pay | Admitting: Pharmacist

## 2024-03-02 ENCOUNTER — Other Ambulatory Visit: Payer: Self-pay | Admitting: Pharmacist

## 2024-03-02 ENCOUNTER — Other Ambulatory Visit: Payer: Self-pay

## 2024-03-02 DIAGNOSIS — J4489 Other specified chronic obstructive pulmonary disease: Secondary | ICD-10-CM

## 2024-03-02 MED ORDER — DUPIXENT 300 MG/2ML ~~LOC~~ SOAJ
300.0000 mg | SUBCUTANEOUS | 1 refills | Status: DC
  Filled 2024-03-02: qty 4, 28d supply, fill #0
  Filled 2024-03-27: qty 4, 28d supply, fill #1
  Filled 2024-04-23: qty 4, 28d supply, fill #2
  Filled 2024-05-24: qty 4, 28d supply, fill #3
  Filled 2024-06-21 – 2024-06-29 (×3): qty 4, 28d supply, fill #4
  Filled 2024-07-25: qty 4, 28d supply, fill #5

## 2024-03-02 NOTE — Progress Notes (Signed)
 Specialty Pharmacy Initial Fill Coordination Note  Bradley Sawyer is a 68 y.o. male contacted today regarding initial fill of specialty medication(s) Dupilumab (Dupixent)   Patient requested Delivery   Delivery date: 03/06/24   Verified address: [address]  [place] [address]   Medication will be filled on 03/05/24.   Patient is aware of $0 copayment.

## 2024-03-02 NOTE — Telephone Encounter (Signed)
 Plan to restarting Dupixent based on OV note from 03/01/2024  Submitted a Prior Authorization request to [facility] for DUPIXENT via CoverMyMeds. Will update once we receive a response.  Key: [id number]  Per automated response: Authorization already on file for this request. Authorization starting on 08/23/2023 and ending on 11/21/2024.  Copay is $0 based on test claim. Patient last filed Dupixent in December 2024. Will likely need to be re-onboarded  Chesley Mires, PharmD, MPH, BCPS, CPP Clinical Pharmacist (Rheumatology and Pulmonology)

## 2024-03-02 NOTE — Progress Notes (Signed)
 Patient restarting Dupixent. Rx sent to [facility] to be re-onboarded. ATC patient but unable to reach due to connection and upon final attempt, VM box was not set up.  Chesley Mires, PharmD, MPH, BCPS, CPP Clinical Pharmacist (Rheumatology and Pulmonology)

## 2024-03-20 ENCOUNTER — Other Ambulatory Visit: Payer: Self-pay

## 2024-03-20 DIAGNOSIS — J4489 Other specified chronic obstructive pulmonary disease: Secondary | ICD-10-CM

## 2024-03-21 ENCOUNTER — Other Ambulatory Visit (HOSPITAL_COMMUNITY): Payer: Self-pay

## 2024-03-21 ENCOUNTER — Encounter (HOSPITAL_COMMUNITY): Payer: Self-pay

## 2024-03-22 ENCOUNTER — Ambulatory Visit (INDEPENDENT_AMBULATORY_CARE_PROVIDER_SITE_OTHER): Admitting: Pulmonary Disease

## 2024-03-22 DIAGNOSIS — J4489 Other specified chronic obstructive pulmonary disease: Secondary | ICD-10-CM

## 2024-03-22 NOTE — Progress Notes (Signed)
 Pt unable to do PFT without feeling dizzy.

## 2024-03-26 ENCOUNTER — Other Ambulatory Visit: Payer: Self-pay

## 2024-03-27 ENCOUNTER — Other Ambulatory Visit: Payer: Self-pay

## 2024-03-27 NOTE — Progress Notes (Signed)
 Specialty Pharmacy Refill Coordination Note  Bradley Sawyer is a 68 y.o. male contacted today regarding refills of specialty medication(s) Dupilumab  (Dupixent )   Patient requested Delivery   Delivery date: 03/29/24   Verified address: [address] [address]   Medication will be filled on 05.07.25.

## 2024-03-28 ENCOUNTER — Other Ambulatory Visit: Payer: Self-pay

## 2024-04-02 ENCOUNTER — Other Ambulatory Visit: Payer: Self-pay | Admitting: Cardiovascular Disease

## 2024-04-02 ENCOUNTER — Other Ambulatory Visit: Payer: Self-pay | Admitting: Gastroenterology

## 2024-04-02 DIAGNOSIS — I10 Essential (primary) hypertension: Secondary | ICD-10-CM

## 2024-04-02 DIAGNOSIS — I2510 Atherosclerotic heart disease of native coronary artery without angina pectoris: Secondary | ICD-10-CM

## 2024-04-02 DIAGNOSIS — K2100 Gastro-esophageal reflux disease with esophagitis, without bleeding: Secondary | ICD-10-CM

## 2024-04-03 ENCOUNTER — Other Ambulatory Visit: Payer: Self-pay | Admitting: Cardiovascular Disease

## 2024-04-03 DIAGNOSIS — I2510 Atherosclerotic heart disease of native coronary artery without angina pectoris: Secondary | ICD-10-CM

## 2024-04-03 DIAGNOSIS — I10 Essential (primary) hypertension: Secondary | ICD-10-CM

## 2024-04-03 DIAGNOSIS — K2100 Gastro-esophageal reflux disease with esophagitis, without bleeding: Secondary | ICD-10-CM

## 2024-04-11 ENCOUNTER — Ambulatory Visit (INDEPENDENT_AMBULATORY_CARE_PROVIDER_SITE_OTHER): Admitting: Pulmonary Disease

## 2024-04-11 ENCOUNTER — Encounter: Payer: Self-pay | Admitting: Pulmonary Disease

## 2024-04-11 VITALS — BP 126/76 | HR 79 | Temp 97.7°F | Ht 71.0 in | Wt 195.0 lb

## 2024-04-11 DIAGNOSIS — J4489 Other specified chronic obstructive pulmonary disease: Secondary | ICD-10-CM

## 2024-04-11 DIAGNOSIS — J9611 Chronic respiratory failure with hypoxia: Secondary | ICD-10-CM | POA: Diagnosis not present

## 2024-04-11 DIAGNOSIS — F17210 Nicotine dependence, cigarettes, uncomplicated: Secondary | ICD-10-CM

## 2024-04-11 DIAGNOSIS — I5189 Other ill-defined heart diseases: Secondary | ICD-10-CM

## 2024-04-11 DIAGNOSIS — J449 Chronic obstructive pulmonary disease, unspecified: Secondary | ICD-10-CM

## 2024-04-11 DIAGNOSIS — K219 Gastro-esophageal reflux disease without esophagitis: Secondary | ICD-10-CM | POA: Diagnosis not present

## 2024-04-11 NOTE — Patient Instructions (Signed)
 VISIT SUMMARY:  You came in today for a follow-up on your stage three COPD with asthma overlap. You mentioned that you have resumed smoking recently due to the passing of your brother, but you plan to quit again using nicotine  patches. You are currently on Wixela, DuoNeb, and a nebulizer, and you feel that Bradley Sawyer is helping significantly. You reported increased wheezing, which you believe is due to smoking and pollen exposure. Your oxygen levels are good, and you are taking your Dupixent  shots regularly. You are in the process of moving and need to update your address for medication delivery. You have a shot scheduled for the upcoming week.  YOUR PLAN:  -STAGE 3 COPD WITH ASTHMA OVERLAP: Stage 3 COPD with asthma overlap means you have a chronic lung disease that makes it hard to breathe, and it is currently more severe due to asthma. You are experiencing increased wheezing likely due to recent smoking and pollen exposure. It is important to quit smoking as it negates the benefits of your medications. Continue using Wixela and DuoNeb as prescribed, and use your nebulizer treatments today to manage wheezing. Make sure to update your address for medication delivery.  Continue Dupixent  injections.  INSTRUCTIONS:  Please schedule a follow-up appointment in three months.  Please call sooner should any new difficulties arise in the interim.

## 2024-04-11 NOTE — Addendum Note (Signed)
 Addended by: Marc Senior on: 04/11/2024 11:47 AM   Modules accepted: Orders

## 2024-04-11 NOTE — Progress Notes (Addendum)
 Subjective:    Patient ID: Bradley Sawyer, male    DOB: [birth year], 68 y.o.   MRN: [id number]  Patient Care Team: Corrington, Kip A, MD as PCP - General (Family Medicine) Marc Senior, MD as Consulting Physician (Pulmonary Disease)  Chief Complaint  Patient presents with   Follow-up    Occasional cough with clear phlegm. Shortness of breath on exertion.     BACKGROUND/INTERVAL: Bradley Sawyer is a 68 year old former smoker, with problems as noted below, who presents for follow-up on the issue of severe COPD, stage III, with asthma overlap.  Asthma is moderate persistent with seasonal variation.  Patient also has chronic respiratory failure with hypoxia requiring 3 L/min of oxygen.  Patient was last seen on 01 March 2024 as an acute visit.  At that time he had been out of Dupixent  since January.  He he had also been out of Breztri  due to inability to afford.  He did not contact our clinic with these concerns.  A pulmonary function test was attempted on 26 Mar 2024 which the patient could not perform.  Patient aborted the test.  Resents today for follow-up.  HPI Discussed the use of AI scribe software for clinical note transcription with the patient, who gave verbal consent to proceed.  History of Present Illness   Bradley Sawyer is a 68 year old male with stage three COPD and asthma overlap who presents for follow-up.  He presents today with his son.  He has resumed smoking recently due to the passing of his brother in [place] , which he finds comforting.  He states that he was triggered by the inability to go to his brother's funeral.  He has been smoking for the past few days but plans to quit again using patches.  He is currently on Wixela, DuoNeb, and Dupixent , which he feels are helping significantly. He uses the nebulizer (DuoNeb) as needed and had a treatment last night.   His oxygen levels are good on supplemental oxygen and he is taking his Dupixent  shots regularly. He  is in the process of moving and needs to update his address for medication delivery. He has a shot scheduled for the upcoming week.  He feels 'a little wheezy today', likely due to recent smoking and pollen exposure.  Examination does show that he has some mild bronchospasm.  He is smoking about a half a pack per day.     Review of Systems A 10 point review of systems was performed and it is as noted above otherwise negative.   Patient Active Problem List   Diagnosis Date Noted   Polyp of colon 09/14/2023   History of colonic polyps 09/14/2023   Chest pain 08/26/2023   AKI (acute kidney injury) (HCC) 08/26/2023   Hypotension 08/26/2023   Leucocytosis 08/26/2023   BPH (benign prostatic hyperplasia) 08/26/2023   Depression with anxiety 08/26/2023   Chronic respiratory failure with hypoxia (HCC) 03/30/2021   COPD exacerbation (HCC) 02/27/2021   History of CVA (cerebrovascular accident) 02/27/2021   Chronic pain, radicular 02/27/2021   Chronic, continuous use of opioids 02/27/2021   Unstable angina (HCC) 02/19/2021   Cigarette nicotine  dependence without complication 05/24/2018   Acute lower UTI 02/19/2017   UTI (urinary tract infection) 02/19/2017   Left inguinal hernia 08/12/2016   Elevated troponin 05/08/2016   Anal fissure 03/15/2016   External hemorrhoid 03/15/2016   Acute viral pharyngitis 03/20/2015   Olecranon bursitis of left elbow 12/23/2014   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  Carotid stenosis 10/15/2014   Stenosis of right subclavian artery (HCC) 09/19/2014   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   PAD (peripheral artery disease) (HCC) 08/23/2014   Preventative health care 08/12/2014   Occlusion and stenosis of carotid artery without mention of cerebral infarction 07/25/2014   Subclavian steal syndrome 07/25/2014   CAD (coronary artery disease) 07/10/2014   Nocturia 04/17/2014   Testicular pain, right 04/17/2014   Urethral stricture 04/17/2014    Cerebral infarction (HCC) 04/10/2014   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   Hematuria 08/06/2013   Compression fracture of L2, with back and right groin pain 07/18/2013   History of colon polyps 05/18/2012   COPD (chronic obstructive pulmonary disease) (HCC) 12/24/2009   Hyperlipemia 09/20/2006   TOBACCO ABUSE 09/20/2006   Essential hypertension 09/20/2006   GERD 09/20/2006    Social History   Tobacco Use   Smoking status: Former    Current packs/day: 0.00    Average packs/day: 2.0 packs/day for 48.0 years (96.0 ttl pk-yrs)    Types: Cigarettes    Start date: 11/22/1972    Quit date: 11/22/2020    Years since quitting: 3.3   Smokeless tobacco: Never  Substance Use Topics   Alcohol use: No    Alcohol/week: 0.0 standard drinks of alcohol    Allergies  Allergen Reactions   Bee Venom Swelling    Current Meds  Medication Sig   albuterol  (VENTOLIN  HFA) 108 (90 Base) MCG/ACT inhaler INHALE 2 PUFFS BY MOUTH EVERY 4-6 HOURS AS NEEDED FOR WHEEZING   aspirin  EC 81 MG tablet Take 81 mg by mouth daily.   atorvastatin  (LIPITOR ) 80 MG tablet TAKE ONE TABLET BY MOUTH ONCE DAILY AT BEDTIME   cefdinir  (OMNICEF ) 300 MG capsule Take 1 capsule (300 mg total) by mouth 2 (two) times daily.   clopidogrel  (PLAVIX ) 75 MG tablet TAKE ONE TABLET BY MOUTH ONCE DAILY   Dupilumab  (DUPIXENT ) 300 MG/2ML SOAJ Inject 300 mg into the skin every 14 (fourteen) days.   EPINEPHrine 0.3 mg/0.3 mL IJ SOAJ injection Inject 0.3 mg into the muscle as needed for anaphylaxis.   famotidine (PEPCID) 40 MG tablet TAKE ONE TABLET BY MOUTH ONCE DAILY   FLUoxetine  (PROZAC ) 20 MG capsule Take 20 mg by mouth as needed.   FLUoxetine  (PROZAC ) 40 MG capsule Take 40 mg by mouth daily.   fluticasone -salmeterol (ADVAIR DISKUS) 250-50 MCG/ACT AEPB Inhale 1 puff into the lungs in the morning and at bedtime.   furosemide  (LASIX ) 20 MG tablet TAKE ONE TABLET BY MOUTH EVERY DAY   ipratropium-albuterol  (DUONEB)  0.5-2.5 (3) MG/3ML SOLN Take 3 mLs by nebulization every 6 (six) hours as needed.   isosorbide  mononitrate (IMDUR ) 30 MG 24 hr tablet Take 30 mg by mouth daily.   levocetirizine (XYZAL) 5 MG tablet Take 5 mg by mouth at bedtime.   linaclotide  (LINZESS ) 290 MCG CAPS capsule Take 290 mcg by mouth daily before breakfast.   lisinopril -hydrochlorothiazide  (ZESTORETIC ) 20-25 MG tablet TAKE ONE TABLET BY MOUTH ONCE DAILY   meloxicam  (MOBIC ) 15 MG tablet Take 15 mg by mouth daily.   metFORMIN  (GLUCOPHAGE ) 500 MG tablet Take 500 mg by mouth daily.   methylPREDNISolone  (MEDROL  DOSEPAK) 4 MG TBPK tablet Take as directed in the package, this is a taper pack.   montelukast  (SINGULAIR ) 10 MG tablet Take 10 mg by mouth daily.   Multiple Vitamin (MULTI-VITAMINS) TABS Take 1 tablet by mouth daily.   naloxone (NARCAN) nasal spray 4 mg/0.1 mL Place  0.4 mg into the nose once.   nitroGLYCERIN  (NITROSTAT ) 0.4 MG SL tablet DISSOLVE (1) TABLET UNDER TONGUE AS NEEDED TO RELIEVE CHEST PAIN. MAY REPEAT EVERY 5 MINUTES.   ondansetron  (ZOFRAN ) 4 MG tablet Take by mouth.   oxyCODONE  (ROXICODONE ) 15 MG immediate release tablet Take 15 mg by mouth.   OXYGEN Inhale 3 L into the lungs.   pantoprazole  (PROTONIX ) 40 MG tablet TAKE ONE TABLET BY MOUTH TWICE DAILY before meals   polyethylene glycol (MIRALAX  / GLYCOLAX ) packet Take 17 g by mouth daily as needed (for constipation).   pregabalin  (LYRICA ) 150 MG capsule Take 150 mg by mouth daily.   rOPINIRole  (REQUIP ) 1 MG tablet Take 1 tablet by mouth at bedtime.   spironolactone (ALDACTONE) 25 MG tablet TAKE ONE TABLET BY MOUTH ONCE DAILY   tamsulosin  (FLOMAX ) 0.4 MG CAPS capsule TAKE ONE CAPSULE BY MOUTH EVERY DAY    Immunization History  Administered Date(s) Administered   DTaP 12/04/2018   Fluad Quad(high Dose 65+) 10/08/2021   Influenza, High Dose Seasonal PF 10/08/2021   Influenza, Seasonal, Injecte, Preservative Fre 08/01/2013, 08/12/2014, 08/26/2015, 08/06/2016,  09/26/2019   Influenza,inj,Quad PF,6+ Mos 08/01/2013, 08/12/2014   Influenza-Unspecified 10/30/2018, 09/26/2019, 09/10/2020   Moderna Sars-Covid-2 Vaccination 02/06/2020, 03/05/2020, 12/12/2020   PNEUMOCOCCAL CONJUGATE-20 01/27/2022   Pneumococcal Polysaccharide-23 10/20/2014   Pneumococcal-Unspecified 10/20/2014      Objective:     BP 126/76 (BP Location: Left Arm, Patient Position: Sitting, Cuff Size: Normal)   Pulse 79   Temp 97.7 F (36.5 C) (Temporal)   Ht 5\' 11"  (1.803 m)   Wt 195 lb (88.5 kg)   SpO2 97%   BMI 27.20 kg/m   SpO2: 97 % O2 Device: Nasal cannula O2 Flow Rate (L/min): 3 L/min O2 Type: Continuous O2  GENERAL: Well-developed, overweight gentleman, no acute distress.  HEAD: Normocephalic, atraumatic. EYES: Pupils equal, round, reactive to light.  No scleral icterus. MOUTH: Edentulous, oral mucosa moist, no thrush. NECK: Supple. No thyromegaly. Trachea midline. No JVD.  No adenopathy. PULMONARY: Poor air movement bilaterally.  Scattered wheezes but no rhonchi noted. CARDIOVASCULAR: S1 and S2. Regular rate and rhythm.  No rubs, murmurs or gallops heard. ABDOMEN: Benign. MUSCULOSKELETAL: No joint deformity, no clubbing, no edema. NEUROLOGIC: No focal deficits, no gait disturbance.  Speech is fluent. SKIN: Intact,warm,dry. PSYCH: Mood and behavior normal.   Assessment & Plan:     ICD-10-CM   1. Stage 3 severe COPD by GOLD classification (HCC)  J44.9     2. COPD with asthma (HCC)  J44.89     3. Chronic respiratory failure with hypoxia (HCC)  J96.11     4. Gastroesophageal reflux disease, unspecified whether esophagitis present  K21.9     5. Grade II diastolic dysfunction  I51.89      Discussion:    Stage 3 COPD with asthma overlap Stage 3 COPD with asthma overlap, currently experiencing increased wheezing likely due to recent smoking relapse following a family bereavement. He is on [place], DuoNeb, and Dupixent . Smoking cessation is critical as  smoking negates the benefits of current medications. He plans to use nicotine  patches. Reports using nebulizer as needed, with increased use recommended today due to wheezing and environmental factors such as pollen. - Encourage smoking cessation with nicotine  patches. - Continue Wixela and DuoNeb as prescribed. - Use nebulizer treatments today to manage wheezing. - Update address for medication delivery. - Schedule follow-up in three months  Patient requested rereferral to pulmonary rehab will place referral.  Advised if  symptoms do not improve or worsen, to please contact office for sooner follow up or seek emergency care.    I spent 30 minutes of dedicated to the care of this patient on the date of this encounter to include pre-visit review of records, face-to-face time with the patient discussing conditions above, post visit ordering of testing, clinical documentation with the electronic health record, making appropriate referrals as documented, and communicating necessary findings to members of the patients care team.     C. Chloe Counter, MD Advanced Bronchoscopy PCC[facility]    *This note was generated using voice recognition software/Dragon and/or AI transcription program.  Despite best efforts to proofread, errors can occur which can change the meaning. Any transcriptional errors that result from this process are unintentional and may not be fully corrected at the time of dictation.

## 2024-04-18 ENCOUNTER — Other Ambulatory Visit: Payer: Self-pay

## 2024-04-23 ENCOUNTER — Other Ambulatory Visit: Payer: Self-pay

## 2024-04-23 ENCOUNTER — Other Ambulatory Visit: Payer: Self-pay | Admitting: Pharmacy Technician

## 2024-04-23 NOTE — Progress Notes (Signed)
 Specialty Pharmacy Refill Coordination Note  Bradley Sawyer is a 68 y.o. male contacted today regarding refills of specialty medication(s) Dupilumab  (Dupixent )   Patient requested Delivery   Delivery date: 04/24/24   Verified address: [address] [place], [place] [address]   Medication will be filled on 04/23/24.

## 2024-05-04 ENCOUNTER — Encounter: Attending: Pulmonary Disease | Admitting: *Deleted

## 2024-05-04 DIAGNOSIS — J4489 Other specified chronic obstructive pulmonary disease: Secondary | ICD-10-CM

## 2024-05-04 NOTE — Progress Notes (Signed)
 Initial phone call completed. Diagnosis can be found in [facility] 5/21. EP Orientation scheduled for Tuesday 6/17 at 8am.

## 2024-05-08 ENCOUNTER — Ambulatory Visit

## 2024-05-14 ENCOUNTER — Other Ambulatory Visit: Payer: Self-pay

## 2024-05-14 DIAGNOSIS — J449 Chronic obstructive pulmonary disease, unspecified: Secondary | ICD-10-CM

## 2024-05-14 DIAGNOSIS — J9611 Chronic respiratory failure with hypoxia: Secondary | ICD-10-CM

## 2024-05-22 ENCOUNTER — Other Ambulatory Visit (HOSPITAL_COMMUNITY): Payer: Self-pay

## 2024-05-23 ENCOUNTER — Other Ambulatory Visit: Payer: Self-pay | Admitting: Pulmonary Disease

## 2024-05-24 ENCOUNTER — Other Ambulatory Visit (HOSPITAL_COMMUNITY): Payer: Self-pay

## 2024-05-24 ENCOUNTER — Other Ambulatory Visit: Payer: Self-pay

## 2024-05-24 NOTE — Progress Notes (Signed)
 Specialty Pharmacy Refill Coordination Note  Spoke with Bradley Sawyer  Bradley Sawyer is a 68 y.o. male contacted today regarding refills of specialty medication(s) Dupilumab  (Dupixent )  Injection date: 06/01/24   Patient requested: Delivery   Delivery date: 05/29/24   Verified address: [address]  [place] [address]  Medication will be filled on 05/28/24.

## 2024-05-31 ENCOUNTER — Other Ambulatory Visit: Payer: Self-pay | Admitting: Pulmonary Disease

## 2024-06-11 ENCOUNTER — Telehealth: Payer: Self-pay

## 2024-06-11 DIAGNOSIS — J9611 Chronic respiratory failure with hypoxia: Secondary | ICD-10-CM

## 2024-06-11 DIAGNOSIS — J449 Chronic obstructive pulmonary disease, unspecified: Secondary | ICD-10-CM

## 2024-06-11 NOTE — Telephone Encounter (Signed)
 Copied from CRM [id number]. Topic: Clinical - Order For Equipment >> Jun 08, 2024 11:47 AM [name] wrote: Reason for CRM: Pt has been attempting to get a portable oxygen concentrator since his current set up for oxygen when going mobile is beginning to become a burden at age 68. Pt stated his supplier [employer] needs an order for the prescription faxed over to them signed by Dr. Tamea. Pt stated his insurance would assist, but it needs to be started from Dr. Evalyn end. [employer] told the pt they would be faxing the info to the clinic. Pt's phone number is [phone] ok to leave a vm.

## 2024-06-11 NOTE — Telephone Encounter (Signed)
 We have not received anything from [employer]. Donzell can you find out what they are needing?

## 2024-06-12 NOTE — Telephone Encounter (Signed)
Okay to place POC order

## 2024-06-12 NOTE — Telephone Encounter (Signed)
 I called and spoke with Bradley Sawyer. I told him the last order placed on 05/14/24 was for a replacement battery for his POC and [employer] wouldn't give him one because they didn't give him the POC. When I spoke with Bradley Sawyer he stated his paid out of pocket for his POC 09/2021. Because his current POC keeps getting hot patient is requesting an order to be placed for new POC. I have called Avelina with [employer] and ask for her to call me about what is needed for the patient to get the POC

## 2024-06-12 NOTE — Telephone Encounter (Signed)
 Order has been placed and the patient is aware.  .Nothing further needed.

## 2024-06-12 NOTE — Telephone Encounter (Signed)
 Last order that was placed was for a POC cord. [employer] is unable to supply him with a cord because they did not sully him with the POC. He needs an order for a POC sent in.  I did notify the patient and told him we would need to see him back in the office to qualify him for a POC because we did not do it at his last appt. He said he spoke with his insurance company and they told him all they needed was an order for a POC.   Okay to place the order?

## 2024-06-18 ENCOUNTER — Other Ambulatory Visit: Payer: Self-pay | Admitting: Gastroenterology

## 2024-06-19 ENCOUNTER — Other Ambulatory Visit: Payer: Self-pay

## 2024-06-19 ENCOUNTER — Telehealth: Payer: Self-pay

## 2024-06-19 NOTE — Telephone Encounter (Signed)
 Per fax from [employer]- the patient needs to have an updated walk test done.  I notified the patient and scheduled him an appt for tomorrow at 10:30am.  Nothing further needed.'

## 2024-06-19 NOTE — Telephone Encounter (Signed)
 Copied from CRM #[id number]. Topic: Clinical - Order For Equipment >> Jun 19, 2024 10:35 AM [name] wrote: Reason for CRM: Pt is requesting a call from Dr. Tamea or her nurse regarding his POC order. I do see an encounter stating the POC order was placed on 06/12/2024 via Evalene Louder CMA, but the pt stated he feels like he's getting the run around as he's been trying for a long time to get the POC. Pt's phone number is [phone], and he stated he would like a call either today or tomorrow to speak about this.

## 2024-06-20 ENCOUNTER — Encounter: Payer: Self-pay | Admitting: Pulmonary Disease

## 2024-06-20 ENCOUNTER — Ambulatory Visit (INDEPENDENT_AMBULATORY_CARE_PROVIDER_SITE_OTHER): Admitting: Pulmonary Disease

## 2024-06-20 VITALS — BP 100/70 | HR 77 | Temp 99.1°F | Ht 71.0 in | Wt 195.0 lb

## 2024-06-20 DIAGNOSIS — I5189 Other ill-defined heart diseases: Secondary | ICD-10-CM

## 2024-06-20 DIAGNOSIS — J449 Chronic obstructive pulmonary disease, unspecified: Secondary | ICD-10-CM

## 2024-06-20 DIAGNOSIS — J9611 Chronic respiratory failure with hypoxia: Secondary | ICD-10-CM

## 2024-06-20 DIAGNOSIS — J4489 Other specified chronic obstructive pulmonary disease: Secondary | ICD-10-CM

## 2024-06-20 NOTE — Patient Instructions (Signed)
 VISIT SUMMARY:  You came in today for a follow-up visit regarding your COPD. You mentioned experiencing shortness of breath, which you believe is not due to low oxygen levels. You also discussed your history with pulmonary rehabilitation and the challenges you face due to hip pain. We reviewed your current inhaler use and discussed your oxygen levels.  YOUR PLAN:  -CHRONIC OBSTRUCTIVE PULMONARY DISEASE (COPD): COPD is a chronic lung condition that makes it hard to breathe. Your lung function has improved with treatment, and your oxygen levels remain stable when you walk. However, you still experience shortness of breath due to factors other than low oxygen levels. We will schedule a six-minute walk test to check your oxygen levels during activity. Please use only the Breztri  inhaler and the albuterol  inhaler as needed, and stop using the other inhaler to avoid taking too much medication. We will see you again in two months.  INSTRUCTIONS:  Please schedule a six-minute walk test to assess your oxygen levels during exertion. Use only the Breztri  inhaler and the albuterol  inhaler as needed, and discontinue the other inhaler. Schedule a follow-up appointment in two months.

## 2024-06-20 NOTE — Progress Notes (Signed)
 Subjective:    Patient ID: Bradley Sawyer, male    DOB: 1956/04/30, 68 y.o.   MRN: [id number]  Patient Care Team: Bobbette Coye LABOR, MD as PCP - General (Family Medicine) Tamea Dedra CROME, MD as Consulting Physician (Pulmonary Disease)  Chief Complaint  Patient presents with   Follow-up    Qualifying test for POC      BACKGROUND/INTERVAL:Bradley Sawyer is a 68 year old former smoker, with problems as noted below, who presents for follow-up on the issue of severe COPD, stage III, with asthma overlap.  Asthma is moderate persistent with seasonal variation.  Patient has also had issues with respiratory failure with hypoxia.  Patient was last seen on 11 Apr 2024 as an acute visit. A pulmonary function test was attempted on 26 Mar 2024 which the patient could not perform due to dizziness.  Patient aborted the test.  He has been on supplemental oxygen but requires requalification.  He has been on Dupixent  for his asthmatic component.  He has been for the most part abstinent of cigarettes for 3-1/2 years but has had few relapses for brief periods of time.  HPI Discussed the use of AI scribe software for clinical note transcription with the patient, who gave verbal consent to proceed.  History of Present Illness   [name] is a 68 year old male with COPD with asthma overlap who presents for follow-up.  He experiences shortness of breath, with minimal exertion. He describes having good and bad days, with today being a better day due to cooler weather. He reports difficulty qualifying for a portable oxygen device because he cannot demonstrate a drop in oxygen levels during testing. He measures his oxygen levels and notes variability depending on the weather, with better levels on cooler days.  He has a history of participating in pulmonary rehabilitation but had to discontinue due to pain in both hips. He wants to resume pulmonary rehab but is currently hindered by hip pain.  He is  currently using three inhalers: Breztri , an emergency albuterol  inhaler, and another disc inhaler (Advair).  He was instructed previously to discontinue Advair and start Breztri  which he states works better.  He however continued using the Advair.  He carries his rescue inhaler with him and has not needed it today.   He drove himself to the appointment and did not require air conditioning, indicating a relatively good day for his breathing.  He has issues with diastolic dysfunction    DATA 06/17/2021 PFTs: FEV1 1.53 L or 42% predicted, FVC 2.87 L or 59% predicted, FEV1/FVC 53%, there is no significant bronchodilator response.  Lung volumes and DLCO are uninterpretable due to the patient having difficulty with the testing.  Consistent with severe obstructive defect. 03/26/2024 PFTs: Patient could not perform.  Review of Systems A 10 point review of systems was performed and it is as noted above otherwise negative.   Patient Active Problem List   Diagnosis Date Noted   Polyp of colon 09/14/2023   History of colonic polyps 09/14/2023   Chest pain 08/26/2023   AKI (acute kidney injury) (HCC) 08/26/2023   Hypotension 08/26/2023   Leucocytosis 08/26/2023   BPH (benign prostatic hyperplasia) 08/26/2023   Depression with anxiety 08/26/2023   Chronic respiratory failure with hypoxia (HCC) 03/30/2021   COPD exacerbation (HCC) 02/27/2021   History of CVA (cerebrovascular accident) 02/27/2021   Chronic pain, radicular 02/27/2021   Chronic, continuous use of opioids 02/27/2021   Unstable angina (HCC) 02/19/2021   Cigarette  nicotine  dependence without complication 05/24/2018   Acute lower UTI 02/19/2017   UTI (urinary tract infection) 02/19/2017   Left inguinal hernia 08/12/2016   Elevated troponin 05/08/2016   Anal fissure 03/15/2016   External hemorrhoid 03/15/2016   Acute viral pharyngitis 03/20/2015   Olecranon bursitis of left elbow 12/23/2014   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   Carotid  stenosis 10/15/2014   Stenosis of right subclavian artery (HCC) 09/19/2014   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   PAD (peripheral artery disease) (HCC) 08/23/2014   Preventative health care 08/12/2014   Occlusion and stenosis of carotid artery without mention of cerebral infarction 07/25/2014   Subclavian steal syndrome 07/25/2014   CAD (coronary artery disease) 07/10/2014   Nocturia 04/17/2014   Testicular pain, right 04/17/2014   Urethral stricture 04/17/2014   Cerebral infarction (HCC) 04/10/2014   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   Hematuria 08/06/2013   Compression fracture of L2, with back and right groin pain 07/18/2013   History of colon polyps 05/18/2012   COPD (chronic obstructive pulmonary disease) (HCC) 12/24/2009   Hyperlipemia 09/20/2006   TOBACCO ABUSE 09/20/2006   Essential hypertension 09/20/2006   GERD 09/20/2006    Social History   Tobacco Use   Smoking status: Former    Current packs/day: 0.00    Average packs/day: 2.0 packs/day for 48.0 years (96.0 ttl pk-yrs)    Types: Cigarettes    Start date: 11/22/1972    Quit date: 11/22/2020    Years since quitting: 3.5   Smokeless tobacco: Never  Substance Use Topics   Alcohol use: No    Alcohol/week: 0.0 standard drinks of alcohol    Allergies  Allergen Reactions   Bee Venom Swelling   Tapentadol Shortness Of Breath    Other Reaction(s): sob/tachycardia    Current Meds  Medication Sig   albuterol  (VENTOLIN  HFA) 108 (90 Base) MCG/ACT inhaler INHALE 2 PUFFS BY MOUTH EVERY 4-6 HOURS AS NEEDED FOR WHEEZING   amLODipine  (NORVASC ) 5 MG tablet Take 5 mg by mouth daily.   aspirin  EC 81 MG tablet Take 81 mg by mouth daily.   atorvastatin  (LIPITOR ) 80 MG tablet TAKE ONE TABLET BY MOUTH ONCE DAILY AT BEDTIME   BREZTRI  AEROSPHERE 160-9-4.8 MCG/ACT AERO inhaler INHALE TWO PUFFS into THE lungs TWICE DAILY morning & bedtime   cefdinir  (OMNICEF ) 300 MG capsule Take 1  capsule (300 mg total) by mouth 2 (two) times daily.   clopidogrel  (PLAVIX ) 75 MG tablet TAKE ONE TABLET BY MOUTH ONCE DAILY   Dupilumab  (DUPIXENT ) 300 MG/2ML SOAJ Inject 300 mg into the skin every 14 (fourteen) days.   EPINEPHrine 0.3 mg/0.3 mL IJ SOAJ injection Inject 0.3 mg into the muscle as needed for anaphylaxis.   famotidine (PEPCID) 40 MG tablet TAKE ONE TABLET BY MOUTH ONCE DAILY   FLUoxetine  (PROZAC ) 20 MG capsule Take 20 mg by mouth as needed.   FLUoxetine  (PROZAC ) 40 MG capsule Take 40 mg by mouth daily.   fluticasone -salmeterol (ADVAIR) 250-50 MCG/ACT AEPB Inhale 1 puff into the lungs 2 (two) times daily.   furosemide  (LASIX ) 20 MG tablet TAKE ONE TABLET BY MOUTH EVERY DAY   ipratropium-albuterol  (DUONEB) 0.5-2.5 (3) MG/3ML SOLN Take 3 mLs by nebulization every 6 (six) hours as needed.   isosorbide  mononitrate (IMDUR ) 30 MG 24 hr tablet Take 30 mg by mouth daily.   lansoprazole  (PREVACID ) 30 MG capsule Take 30 mg by mouth.   levocetirizine (XYZAL) 5 MG tablet Take 5 mg by  mouth at bedtime.   linaclotide  (LINZESS ) 290 MCG CAPS capsule Take 290 mcg by mouth daily before breakfast.   lisinopril -hydrochlorothiazide  (ZESTORETIC ) 20-25 MG tablet TAKE ONE TABLET BY MOUTH ONCE DAILY   meloxicam  (MOBIC ) 15 MG tablet Take 15 mg by mouth daily.   metFORMIN  (GLUCOPHAGE ) 500 MG tablet Take 500 mg by mouth daily.   methylPREDNISolone  (MEDROL  DOSEPAK) 4 MG TBPK tablet Take as directed in the package, this is a taper pack.   montelukast  (SINGULAIR ) 10 MG tablet Take 10 mg by mouth daily.   Multiple Vitamin (MULTI-VITAMINS) TABS Take 1 tablet by mouth daily.   naloxone (NARCAN) nasal spray 4 mg/0.1 mL Place 0.4 mg into the nose once.   nitroGLYCERIN  (NITROSTAT ) 0.4 MG SL tablet DISSOLVE (1) TABLET UNDER TONGUE AS NEEDED TO RELIEVE CHEST PAIN. MAY REPEAT EVERY 5 MINUTES.   ondansetron  (ZOFRAN ) 4 MG tablet Take by mouth.   oxyCODONE  (ROXICODONE ) 15 MG immediate release tablet Take 15 mg by mouth.    OXYGEN Inhale 3 L into the lungs.   pantoprazole  (PROTONIX ) 40 MG tablet TAKE ONE TABLET BY MOUTH TWICE DAILY BEFORE MEALS   polyethylene glycol (MIRALAX  / GLYCOLAX ) packet Take 17 g by mouth daily as needed (for constipation).   predniSONE  (DELTASONE ) 10 MG tablet 6 daily x 2 days, 5 daily x 2 days, 4 daily x 2 days, 3 daily x 2 days, 2 daily x 2 days,1 daily x 2 days then off   pregabalin  (LYRICA ) 150 MG capsule Take 150 mg by mouth daily.   rOPINIRole  (REQUIP ) 1 MG tablet Take 1 tablet by mouth at bedtime.   spironolactone (ALDACTONE) 25 MG tablet TAKE ONE TABLET BY MOUTH ONCE DAILY   tamsulosin  (FLOMAX ) 0.4 MG CAPS capsule TAKE ONE CAPSULE BY MOUTH EVERY DAY    Immunization History  Administered Date(s) Administered   DTaP 12/04/2018   Fluad Quad(high Dose 65+) 10/08/2021   Influenza, High Dose Seasonal PF 10/08/2021   Influenza, Seasonal, Injecte, Preservative Fre 08/01/2013, 08/12/2014, 08/26/2015, 08/06/2016, 09/26/2019   Influenza,inj,Quad PF,6+ Mos 08/01/2013, 08/12/2014   Influenza-Unspecified 10/30/2018, 09/26/2019, 09/10/2020   Moderna Sars-Covid-2 Vaccination 02/06/2020, 03/05/2020, 12/12/2020   PNEUMOCOCCAL CONJUGATE-20 01/27/2022   Pneumococcal Polysaccharide-23 10/20/2014   Pneumococcal-Unspecified 10/20/2014        Objective:     BP 100/70 (BP Location: Right Arm, Patient Position: Sitting, Cuff Size: Normal)   Pulse 77   Temp 99.1 F (37.3 C) (Oral)   Ht 5' 11 (1.803 m)   Wt 195 lb (88.5 kg)   SpO2 95% Comment: on RA  BMI 27.20 kg/m   SpO2: 95 % (on RA)  GENERAL: Well-developed, overweight gentleman, no acute distress.  Disheveled.  Fully ambulatory.  No conversational dyspnea. HEAD: Normocephalic, atraumatic. EYES: Pupils equal, round, reactive to light.  No scleral icterus. MOUTH: Edentulous, oral mucosa moist, no thrush. NECK: Supple. No thyromegaly. Trachea midline. No JVD.  No adenopathy. PULMONARY: Good air movement bilaterally.  Coarse,  otherwise, no adventitious sounds. CARDIOVASCULAR: S1 and S2. Regular rate and rhythm.  No rubs, murmurs or gallops heard. ABDOMEN: Benign. MUSCULOSKELETAL: No joint deformity, no clubbing, no edema. NEUROLOGIC: No focal deficits, no gait disturbance.  Speech is fluent. SKIN: Intact,warm,dry. PSYCH: Mood and behavior normal.  Ambulatory oxymetry was performed today:  At rest on room air oxygen saturation was 94%, the patient ambulated at a slow pace, completed 3 laps, O2 nadir 94 per, severe shortness of breath.  Resting heart rate was 77 bpm at maximum for this exercise 102  bpm.  During the exercise heart rate appeared to be erratic.  No significant oxygen desaturations noted.  Perception of dyspnea may be related to cardiac/deconditioning   Assessment & Plan:     ICD-10-CM   1. Stage 3 severe COPD by GOLD classification (HCC)  J44.9 6 minute walk    2. Chronic respiratory failure with hypoxia (HCC)  J96.11 6 minute walk    3. COPD with asthma (HCC)  J44.89     4. Grade II diastolic dysfunction  I51.89      Orders Placed This Encounter  Procedures   6 minute walk    Standing Status:   Future    Expiration Date:   06/20/2025    Where should this test be performed?:   [facility]   Discussion:    Chronic obstructive pulmonary disease (COPD) COPD with improved lung function following treatment. Oxygen levels remain stable during ambulation, indicating effective current treatment. Shortness of breath is attributed to non-oxygen desaturation factors. Previously qualified for home oxygen, but current levels do not meet Medicare criteria for portable oxygen. Experiences difficulty with pulmonary rehab due to hip pain from nerve damage. Currently using Breztri  and albuterol  inhalers, with potential risk of overdosing due to additional inhaler use (Advair). - Schedule a six-minute walk test to better assess oxygen levels during exertion. - Instruct to use only Breztri  and albuterol  as needed,  discontinue other inhalers to avoid overdosing. - Continue Dupixent  which is treating both COPD and asthma components. - Schedule follow-up appointment in two months. - Recommend he touch base with cardiology (Dr. Fernand) - Reconsider pulmonary rehab.     Advised if symptoms do not improve or worsen, to please contact office for sooner follow up or seek emergency care.    I spent 40 minutes of dedicated to the care of this patient on the date of this encounter to include pre-visit review of records, face-to-face time with the patient discussing conditions above, post visit ordering of testing, clinical documentation with the electronic health record, making appropriate referrals as documented, and communicating necessary findings to members of the patients care team.   C. Leita Sanders, MD Advanced Bronchoscopy [facility]    *This note was generated using voice recognition software/Dragon and/or AI transcription program.  Despite best efforts to proofread, errors can occur which can change the meaning. Any transcriptional errors that result from this process are unintentional and may not be fully corrected at the time of dictation.

## 2024-06-21 ENCOUNTER — Other Ambulatory Visit: Payer: Self-pay

## 2024-06-22 ENCOUNTER — Ambulatory Visit

## 2024-06-25 ENCOUNTER — Other Ambulatory Visit: Payer: Self-pay

## 2024-06-29 ENCOUNTER — Other Ambulatory Visit: Payer: Self-pay

## 2024-06-29 ENCOUNTER — Other Ambulatory Visit: Payer: Self-pay | Admitting: Pharmacy Technician

## 2024-06-29 NOTE — Progress Notes (Signed)
 Specialty Pharmacy Refill Coordination Note  [name] is a 68 y.o. male contacted today regarding refills of specialty medication(s) Dupilumab  (Dupixent )   Patient requested Delivery   Delivery date: 07/03/24   Verified address: [address]  [place]   Medication will be filled on 07/04/24.

## 2024-07-02 ENCOUNTER — Other Ambulatory Visit: Payer: Self-pay

## 2024-07-12 ENCOUNTER — Ambulatory Visit: Admitting: Pulmonary Disease

## 2024-07-20 ENCOUNTER — Other Ambulatory Visit (HOSPITAL_COMMUNITY): Payer: Self-pay

## 2024-07-25 ENCOUNTER — Other Ambulatory Visit: Payer: Self-pay

## 2024-07-27 ENCOUNTER — Other Ambulatory Visit: Payer: Self-pay

## 2024-08-05 ENCOUNTER — Inpatient Hospital Stay
Admission: EM | Admit: 2024-08-05 | Discharge: 2024-08-07 | DRG: 602 | Disposition: A | Discharge status: Home / Self Care | Attending: Student | Admitting: Student

## 2024-08-05 ENCOUNTER — Other Ambulatory Visit: Payer: Self-pay

## 2024-08-05 ENCOUNTER — Emergency Department

## 2024-08-05 DIAGNOSIS — J9621 Acute and chronic respiratory failure with hypoxia: Secondary | ICD-10-CM | POA: Diagnosis present

## 2024-08-05 DIAGNOSIS — Z79899 Other long term (current) drug therapy: Secondary | ICD-10-CM

## 2024-08-05 DIAGNOSIS — Z7902 Long term (current) use of antithrombotics/antiplatelets: Secondary | ICD-10-CM

## 2024-08-05 DIAGNOSIS — M79671 Pain in right foot: Principal | ICD-10-CM

## 2024-08-05 DIAGNOSIS — J441 Chronic obstructive pulmonary disease with (acute) exacerbation: Secondary | ICD-10-CM | POA: Diagnosis present

## 2024-08-05 DIAGNOSIS — E785 Hyperlipidemia, unspecified: Secondary | ICD-10-CM | POA: Diagnosis present

## 2024-08-05 DIAGNOSIS — Z87891 Personal history of nicotine dependence: Secondary | ICD-10-CM | POA: Diagnosis not present

## 2024-08-05 DIAGNOSIS — E876 Hypokalemia: Secondary | ICD-10-CM | POA: Diagnosis present

## 2024-08-05 DIAGNOSIS — Z820 Family history of epilepsy and other diseases of the nervous system: Secondary | ICD-10-CM

## 2024-08-05 DIAGNOSIS — Z7984 Long term (current) use of oral hypoglycemic drugs: Secondary | ICD-10-CM

## 2024-08-05 DIAGNOSIS — L03115 Cellulitis of right lower limb: Secondary | ICD-10-CM | POA: Diagnosis present

## 2024-08-05 DIAGNOSIS — F32A Depression, unspecified: Secondary | ICD-10-CM | POA: Diagnosis present

## 2024-08-05 DIAGNOSIS — Z7982 Long term (current) use of aspirin: Secondary | ICD-10-CM | POA: Diagnosis not present

## 2024-08-05 DIAGNOSIS — T502X5A Adverse effect of carbonic-anhydrase inhibitors, benzothiadiazides and other diuretics, initial encounter: Secondary | ICD-10-CM | POA: Diagnosis present

## 2024-08-05 DIAGNOSIS — E1151 Type 2 diabetes mellitus with diabetic peripheral angiopathy without gangrene: Secondary | ICD-10-CM | POA: Diagnosis present

## 2024-08-05 DIAGNOSIS — Z981 Arthrodesis status: Secondary | ICD-10-CM

## 2024-08-05 DIAGNOSIS — E611 Iron deficiency: Secondary | ICD-10-CM | POA: Diagnosis present

## 2024-08-05 DIAGNOSIS — K760 Fatty (change of) liver, not elsewhere classified: Secondary | ICD-10-CM | POA: Diagnosis present

## 2024-08-05 DIAGNOSIS — K219 Gastro-esophageal reflux disease without esophagitis: Secondary | ICD-10-CM | POA: Diagnosis present

## 2024-08-05 DIAGNOSIS — Z7951 Long term (current) use of inhaled steroids: Secondary | ICD-10-CM

## 2024-08-05 DIAGNOSIS — Z9981 Dependence on supplemental oxygen: Secondary | ICD-10-CM | POA: Diagnosis not present

## 2024-08-05 DIAGNOSIS — Z791 Long term (current) use of non-steroidal anti-inflammatories (NSAID): Secondary | ICD-10-CM

## 2024-08-05 DIAGNOSIS — E1165 Type 2 diabetes mellitus with hyperglycemia: Secondary | ICD-10-CM | POA: Diagnosis present

## 2024-08-05 DIAGNOSIS — I10 Essential (primary) hypertension: Secondary | ICD-10-CM | POA: Diagnosis present

## 2024-08-05 DIAGNOSIS — Z833 Family history of diabetes mellitus: Secondary | ICD-10-CM

## 2024-08-05 DIAGNOSIS — Z8673 Personal history of transient ischemic attack (TIA), and cerebral infarction without residual deficits: Secondary | ICD-10-CM

## 2024-08-05 DIAGNOSIS — Z8249 Family history of ischemic heart disease and other diseases of the circulatory system: Secondary | ICD-10-CM | POA: Diagnosis not present

## 2024-08-05 DIAGNOSIS — Z888 Allergy status to other drugs, medicaments and biological substances status: Secondary | ICD-10-CM

## 2024-08-05 DIAGNOSIS — N400 Enlarged prostate without lower urinary tract symptoms: Secondary | ICD-10-CM | POA: Diagnosis present

## 2024-08-05 DIAGNOSIS — Z91030 Bee allergy status: Secondary | ICD-10-CM

## 2024-08-05 LAB — CBC
HCT: 32.2 % — ABNORMAL LOW (ref 39.0–52.0)
Hemoglobin: 10.4 g/dL — ABNORMAL LOW (ref 13.0–17.0)
MCH: 27.9 pg (ref 26.0–34.0)
MCHC: 32.3 g/dL (ref 30.0–36.0)
MCV: 86.3 fL (ref 80.0–100.0)
Platelets: 361 K/uL (ref 150–400)
RBC: 3.73 MIL/uL — ABNORMAL LOW (ref 4.22–5.81)
RDW: 13.3 % (ref 11.5–15.5)
WBC: 14.8 K/uL — ABNORMAL HIGH (ref 4.0–10.5)
nRBC: 0 % (ref 0.0–0.2)

## 2024-08-05 LAB — HEMOGLOBIN A1C
Hgb A1c MFr Bld: 6 % — ABNORMAL HIGH (ref 4.8–5.6)
Mean Plasma Glucose: 125.5 mg/dL

## 2024-08-05 LAB — LIPID PANEL
Cholesterol: 121 mg/dL (ref 0–200)
HDL: 46 mg/dL (ref >40)
LDL Cholesterol: 53 mg/dL (ref 0–99)
Total CHOL/HDL Ratio: 2.6 ratio
Triglycerides: 112 mg/dL (ref <150)
VLDL: 22 mg/dL (ref 0–40)

## 2024-08-05 LAB — BASIC METABOLIC PANEL WITH GFR
Anion gap: 16 — ABNORMAL HIGH (ref 5–15)
BUN: 10 mg/dL (ref 8–23)
CO2: 28 mmol/L (ref 22–32)
Calcium: 8.6 mg/dL — ABNORMAL LOW (ref 8.9–10.3)
Chloride: 90 mmol/L — ABNORMAL LOW (ref 98–111)
Creatinine, Ser: 1.15 mg/dL (ref 0.61–1.24)
GFR, Estimated: >60 mL/min (ref >60)
Glucose, Bld: 144 mg/dL — ABNORMAL HIGH (ref 70–99)
Potassium: 2.5 mmol/L — CL (ref 3.5–5.1)
Sodium: 134 mmol/L — ABNORMAL LOW (ref 135–145)

## 2024-08-05 LAB — CBG MONITORING, ED
Glucose-Capillary: 219 mg/dL — ABNORMAL HIGH (ref 70–99)
Glucose-Capillary: 182 mg/dL — ABNORMAL HIGH (ref 70–99)

## 2024-08-05 LAB — URIC ACID: Uric Acid, Serum: 8 mg/dL (ref 3.7–8.6)

## 2024-08-05 LAB — SEDIMENTATION RATE: Sed Rate: 108 mm/h — ABNORMAL HIGH (ref 0–20)

## 2024-08-05 LAB — CK: Total CK: 176 U/L (ref 49–397)

## 2024-08-05 LAB — PHOSPHORUS: Phosphorus: 2.7 mg/dL (ref 2.5–4.6)

## 2024-08-05 LAB — PROCALCITONIN: Procalcitonin: <0.1 ng/mL

## 2024-08-05 LAB — MAGNESIUM: Magnesium: 1.3 mg/dL — ABNORMAL LOW (ref 1.7–2.4)

## 2024-08-05 MED ORDER — ROPINIROLE HCL 1 MG PO TABS
1.0000 mg | ORAL_TABLET | Freq: Every day | ORAL | Status: DC
  Administered 2024-08-05 – 2024-08-06 (×2): 1 mg via ORAL
  Filled 2024-08-05 (×2): qty 1

## 2024-08-05 MED ORDER — POTASSIUM CHLORIDE 10 MEQ/100ML IV SOLN
10.0000 meq | INTRAVENOUS | Status: AC
  Administered 2024-08-05 (×2): 10 meq via INTRAVENOUS
  Filled 2024-08-05 (×2): qty 100

## 2024-08-05 MED ORDER — LEVOCETIRIZINE DIHYDROCHLORIDE 5 MG PO TABS: 5.0000 mg | ORAL_TABLET | Freq: Every day | ORAL | Status: DC

## 2024-08-05 MED ORDER — POTASSIUM CHLORIDE CRYS ER 20 MEQ PO TBCR
40.0000 meq | EXTENDED_RELEASE_TABLET | ORAL | Status: AC
  Administered 2024-08-05 (×2): 40 meq via ORAL
  Filled 2024-08-05 (×2): qty 2

## 2024-08-05 MED ORDER — ISOSORBIDE MONONITRATE ER 60 MG PO TB24
30.0000 mg | ORAL_TABLET | Freq: Every day | ORAL | Status: DC
Start: 2024-08-05 — End: 2024-08-07
  Administered 2024-08-05 – 2024-08-07 (×3): 30 mg via ORAL
  Filled 2024-08-05 (×3): qty 1

## 2024-08-05 MED ORDER — IPRATROPIUM-ALBUTEROL 0.5-2.5 (3) MG/3ML IN SOLN
3.0000 mL | RESPIRATORY_TRACT | Status: DC | PRN
  Administered 2024-08-05 (×2): 3 mL via RESPIRATORY_TRACT
  Filled 2024-08-05 (×2): qty 3

## 2024-08-05 MED ORDER — INSULIN ASPART 100 UNIT/ML IJ SOLN
0.0000 [IU] | Freq: Three times a day (TID) | INTRAMUSCULAR | Status: DC
  Administered 2024-08-05: 3 [IU] via SUBCUTANEOUS
  Administered 2024-08-06: 2 [IU] via SUBCUTANEOUS
  Administered 2024-08-06: 1 [IU] via SUBCUTANEOUS
  Administered 2024-08-06: 2 [IU] via SUBCUTANEOUS
  Administered 2024-08-07: 1 [IU] via SUBCUTANEOUS
  Filled 2024-08-05 (×5): qty 1

## 2024-08-05 MED ORDER — ACETAMINOPHEN 650 MG RE SUPP: 650.0000 mg | Freq: Four times a day (QID) | RECTAL | Status: DC | PRN

## 2024-08-05 MED ORDER — METHYLPREDNISOLONE SODIUM SUCC 40 MG IJ SOLR
40.0000 mg | Freq: Two times a day (BID) | INTRAMUSCULAR | Status: AC
  Administered 2024-08-06 (×2): 40 mg via INTRAVENOUS
  Filled 2024-08-05 (×2): qty 1

## 2024-08-05 MED ORDER — BISACODYL 5 MG PO TBEC: 10.0000 mg | DELAYED_RELEASE_TABLET | Freq: Every day | ORAL | Status: DC | PRN

## 2024-08-05 MED ORDER — OXYCODONE HCL 5 MG PO TABS
5.0000 mg | ORAL_TABLET | ORAL | Status: DC | PRN
  Administered 2024-08-05 – 2024-08-06 (×3): 5 mg via ORAL
  Filled 2024-08-05 (×3): qty 1

## 2024-08-05 MED ORDER — FENTANYL CITRATE PF 50 MCG/ML IJ SOSY
50.0000 ug | PREFILLED_SYRINGE | Freq: Once | INTRAMUSCULAR | Status: AC
  Administered 2024-08-05: 50 ug via INTRAVENOUS
  Filled 2024-08-05: qty 1

## 2024-08-05 MED ORDER — MONTELUKAST SODIUM 10 MG PO TABS
10.0000 mg | ORAL_TABLET | Freq: Every day | ORAL | Status: DC
Start: 2024-08-05 — End: 2024-08-07
  Administered 2024-08-05 – 2024-08-07 (×3): 10 mg via ORAL
  Filled 2024-08-05 (×3): qty 1

## 2024-08-05 MED ORDER — PANTOPRAZOLE SODIUM 40 MG PO TBEC
40.0000 mg | DELAYED_RELEASE_TABLET | Freq: Every day | ORAL | Status: DC
Start: 2024-08-05 — End: 2024-08-07
  Administered 2024-08-05 – 2024-08-07 (×3): 40 mg via ORAL
  Filled 2024-08-05 (×3): qty 1

## 2024-08-05 MED ORDER — SODIUM CHLORIDE 0.9% FLUSH
3.0000 mL | Freq: Two times a day (BID) | INTRAVENOUS | Status: DC
  Administered 2024-08-05 – 2024-08-07 (×5): 3 mL via INTRAVENOUS

## 2024-08-05 MED ORDER — SODIUM CHLORIDE 0.9 % IV SOLN: 250.0000 mL | INTRAVENOUS | Status: AC | PRN

## 2024-08-05 MED ORDER — MAGNESIUM SULFATE 2 GM/50ML IV SOLN
2.0000 g | Freq: Once | INTRAVENOUS | Status: AC
  Administered 2024-08-05: 2 g via INTRAVENOUS
  Filled 2024-08-05: qty 50

## 2024-08-05 MED ORDER — FLUOXETINE HCL 20 MG PO CAPS
40.0000 mg | ORAL_CAPSULE | Freq: Every day | ORAL | Status: DC
  Administered 2024-08-05 – 2024-08-07 (×3): 40 mg via ORAL
  Filled 2024-08-05 (×4): qty 2

## 2024-08-05 MED ORDER — NITROGLYCERIN 0.4 MG SL SUBL: 0.4000 mg | SUBLINGUAL_TABLET | SUBLINGUAL | Status: DC | PRN

## 2024-08-05 MED ORDER — COLCHICINE 0.6 MG PO TABS
1.2000 mg | ORAL_TABLET | Freq: Once | ORAL | Status: AC
Start: 2024-08-05 — End: 2024-08-05
  Administered 2024-08-05: 1.2 mg via ORAL
  Filled 2024-08-05: qty 2

## 2024-08-05 MED ORDER — POLYETHYLENE GLYCOL 3350 17 G PO PACK: 17.0000 g | PACK | Freq: Every day | ORAL | Status: DC | PRN

## 2024-08-05 MED ORDER — POTASSIUM CHLORIDE 10 MEQ/100ML IV SOLN
10.0000 meq | INTRAVENOUS | Status: DC
  Filled 2024-08-05: qty 100

## 2024-08-05 MED ORDER — SODIUM CHLORIDE 0.9 % IV SOLN: Freq: Once | INTRAVENOUS | Status: AC

## 2024-08-05 MED ORDER — AZITHROMYCIN 500 MG IV SOLR
500.0000 mg | INTRAVENOUS | Status: DC
  Administered 2024-08-05 – 2024-08-06 (×2): 500 mg via INTRAVENOUS
  Filled 2024-08-05 (×2): qty 5

## 2024-08-05 MED ORDER — OXYCODONE HCL 5 MG PO TABS
15.0000 mg | ORAL_TABLET | Freq: Once | ORAL | Status: AC
  Administered 2024-08-05: 15 mg via ORAL
  Filled 2024-08-05: qty 3

## 2024-08-05 MED ORDER — ENOXAPARIN SODIUM 40 MG/0.4ML IJ SOSY
40.0000 mg | PREFILLED_SYRINGE | INTRAMUSCULAR | Status: DC
  Administered 2024-08-05 – 2024-08-06 (×2): 40 mg via SUBCUTANEOUS
  Filled 2024-08-05 (×2): qty 0.4

## 2024-08-05 MED ORDER — METHYLPREDNISOLONE SODIUM SUCC 125 MG IJ SOLR
125.0000 mg | Freq: Once | INTRAMUSCULAR | Status: AC
  Administered 2024-08-05: 125 mg via INTRAVENOUS
  Filled 2024-08-05: qty 2

## 2024-08-05 MED ORDER — PREGABALIN 50 MG PO CAPS
150.0000 mg | ORAL_CAPSULE | Freq: Every day | ORAL | Status: DC
  Administered 2024-08-05 – 2024-08-07 (×3): 150 mg via ORAL
  Filled 2024-08-05: qty 3
  Filled 2024-08-05: qty 2
  Filled 2024-08-05: qty 3

## 2024-08-05 MED ORDER — SODIUM CHLORIDE 0.9 % IV SOLN
1.0000 g | INTRAVENOUS | Status: DC
  Administered 2024-08-05 – 2024-08-06 (×2): 1 g via INTRAVENOUS
  Filled 2024-08-05 (×2): qty 10

## 2024-08-05 MED ORDER — TAMSULOSIN HCL 0.4 MG PO CAPS
0.4000 mg | ORAL_CAPSULE | Freq: Every day | ORAL | Status: DC
Start: 2024-08-05 — End: 2024-08-07
  Administered 2024-08-05 – 2024-08-07 (×3): 0.4 mg via ORAL
  Filled 2024-08-05 (×3): qty 1

## 2024-08-05 MED ORDER — ADULT MULTIVITAMIN W/MINERALS CH
1.0000 | ORAL_TABLET | Freq: Every day | ORAL | Status: DC
  Administered 2024-08-05 – 2024-08-06 (×2): 1 via ORAL
  Filled 2024-08-05 (×2): qty 1

## 2024-08-05 MED ORDER — POTASSIUM CHLORIDE CRYS ER 20 MEQ PO TBCR
40.0000 meq | EXTENDED_RELEASE_TABLET | Freq: Once | ORAL | Status: AC
Start: 2024-08-05 — End: 2024-08-05
  Administered 2024-08-05: 40 meq via ORAL
  Filled 2024-08-05: qty 2

## 2024-08-05 MED ORDER — ASPIRIN 81 MG PO TBEC
81.0000 mg | DELAYED_RELEASE_TABLET | Freq: Every day | ORAL | Status: DC
Start: 2024-08-05 — End: 2024-08-07
  Administered 2024-08-05 – 2024-08-07 (×3): 81 mg via ORAL
  Filled 2024-08-05 (×3): qty 1

## 2024-08-05 MED ORDER — LORATADINE 10 MG PO TABS
10.0000 mg | ORAL_TABLET | Freq: Every day | ORAL | Status: DC
  Administered 2024-08-05 – 2024-08-07 (×3): 10 mg via ORAL
  Filled 2024-08-05 (×3): qty 1

## 2024-08-05 MED ORDER — ARFORMOTEROL TARTRATE 15 MCG/2ML IN NEBU
15.0000 ug | INHALATION_SOLUTION | Freq: Two times a day (BID) | RESPIRATORY_TRACT | Status: DC
  Administered 2024-08-05 – 2024-08-07 (×4): 15 ug via RESPIRATORY_TRACT
  Filled 2024-08-05 (×7): qty 2

## 2024-08-05 MED ORDER — CLOPIDOGREL BISULFATE 75 MG PO TABS
75.0000 mg | ORAL_TABLET | Freq: Every day | ORAL | Status: DC
Start: 2024-08-05 — End: 2024-08-07
  Administered 2024-08-05 – 2024-08-07 (×3): 75 mg via ORAL
  Filled 2024-08-05 (×3): qty 1

## 2024-08-05 MED ORDER — MORPHINE SULFATE (PF) 2 MG/ML IV SOLN
2.0000 mg | INTRAVENOUS | Status: DC | PRN
  Administered 2024-08-05 – 2024-08-06 (×5): 2 mg via INTRAVENOUS
  Filled 2024-08-05 (×5): qty 1

## 2024-08-05 MED ORDER — PREDNISONE 20 MG PO TABS
40.0000 mg | ORAL_TABLET | Freq: Every day | ORAL | Status: DC
  Administered 2024-08-07: 40 mg via ORAL
  Filled 2024-08-05: qty 2

## 2024-08-05 MED ORDER — BUDESONIDE 0.25 MG/2ML IN SUSP
0.2500 mg | Freq: Two times a day (BID) | RESPIRATORY_TRACT | Status: DC
  Administered 2024-08-05 – 2024-08-07 (×5): 0.25 mg via RESPIRATORY_TRACT
  Filled 2024-08-05 (×5): qty 2

## 2024-08-05 MED ORDER — AMLODIPINE BESYLATE 5 MG PO TABS
5.0000 mg | ORAL_TABLET | Freq: Every day | ORAL | Status: DC
Start: 2024-08-05 — End: 2024-08-07
  Administered 2024-08-05 – 2024-08-07 (×3): 5 mg via ORAL
  Filled 2024-08-05 (×3): qty 1

## 2024-08-05 MED ORDER — SODIUM CHLORIDE 0.9% FLUSH
3.0000 mL | INTRAVENOUS | Status: DC | PRN
  Administered 2024-08-05: 3 mL via INTRAVENOUS

## 2024-08-05 MED ORDER — ACETAMINOPHEN 325 MG PO TABS
650.0000 mg | ORAL_TABLET | Freq: Four times a day (QID) | ORAL | Status: DC | PRN
  Administered 2024-08-05: 650 mg via ORAL
  Filled 2024-08-05: qty 2

## 2024-08-05 NOTE — ED Notes (Signed)
 Elevated foot again.

## 2024-08-05 NOTE — H&P (Signed)
 [facility] History and Physical   Patient: Bradley Sawyer FMW:[id number]   PCP: Bobbette Coye LABOR, MD DOB: 05/09/1956   DOA: 08/05/2024   DOS: 08/05/2024   DOS: the patient was seen and examined on 08/05/2024   Patient coming from: The patient is coming from Home  Chief Complaint: Right foot pain  HPI: Bradley Sawyer is a 68 y.o. male with PMH of chronic hypoxic respiratory failure on 3 to 4 L oxygen, COPD, HTN, CVA, PAD, carotid stenosis, pre-DM, BPH, chronic L2 fracture, as reviewed from EMR, presented to [facility] ED with complaining of right foot pain.  As per patient he noticed right MTP joint swelling a week ago, initially he thought that he hit his foot on the bed and swelling and pain will go away but it got worse.  Patient noticed swelling and pain in his getting worse so he came into the ED.  No prior history of gout.  Patient denies any other complaints.  ED Course: VS afebrile, HR 95, RR 24, BP 97/8394% on 4 L BMP sodium 134, hypokalemia potassium 2.5, BG 144 Hypomagnesemia mag 1.3 CBC WBC 14.8, Hb 10.4 ESR 108 Pro-Cal negative Uric acid 8.0 within normal range X-ray right foot: Chronic degenerative changes. No acute osseous abnormality identified.   Review of Systems: as mentioned in the history of present illness.  All other systems reviewed and are negative.  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   Compression fracture    L2   COPD (chronic obstructive pulmonary disease) (HCC)    bronchitis   CVA (cerebral infarction)    Disorder of vocal cord    Dyspnea    Fatty liver    GERD (gastroesophageal reflux disease)    Hx of cardiovascular stress test    ETT/Lexiscan -Myoview (10/15):  Inferior, inferoapical, apical lateral defect c/w mild ischemia, EF 66%; Intermediate Risk   Hyperlipidemia    Hypertension     Right shoulder pain    2/2 partial rotator cuff tear, tendinopathy, mild subacromial/subdeltioid bursitis, A/C joint arthropathy per MRI done in 3/07   Stroke (HCC) 2014   Tobacco abuse    Past Surgical History:  Procedure Laterality Date   ANTERIOR LAT LUMBAR FUSION N/A 12/18/2013   Procedure: Lumbar Two Anteriorlateral Corpectomy w/ cage, interbody fusion, anterior plating, Lumbar One to Lumbar Three percutaneous pedicle screws;  Surgeon: Victory LABOR Gunnels, MD;  Location: [facility] NEURO ORS;  Service: Neurosurgery;  Laterality: N/A;   AORTA -INNOMIATE BYPASS N/A 10/15/2014   Procedure: ENDARTERCTOMY OF  INNOMINATE ARTERY ;  Surgeon: Carlin FORBES Haddock, MD;  Location: [facility] OR;  Service: Vascular;  Laterality: N/A;   ARCH AORTOGRAM N/A 08/23/2014   Procedure: ARCH AORTOGRAM;  Surgeon: Carlin FORBES Haddock, MD;  Location: [facility] CATH LAB;  Service: Cardiovascular;  Laterality: N/A;   arch aortogram, left carotid,left subclavian angiogram  08/23/2014   BIOPSY  09/14/2023   Procedure: BIOPSY;  Surgeon: Jinny Carmine, MD;  Location: [facility] ENDOSCOPY;  Service: Endoscopy;;   CAROTID ANGIOGRAM Left 08/23/2014   Procedure: CAROTID GERALYN;  Surgeon: Carlin FORBES Haddock, MD;  Location: [facility] CATH LAB;  Service: Cardiovascular;  Laterality: Left;   CHOLECYSTECTOMY  12/2016   COLONOSCOPY WITH PROPOFOL  N/A 09/14/2023   Procedure: COLONOSCOPY WITH PROPOFOL ;  Surgeon: Jinny Carmine, MD;  Location: [facility] ENDOSCOPY;  Service: Endoscopy;  Laterality: N/A;   ENDARTERECTOMY Right 10/15/2014   Procedure: ENDARTERECTOMY RIGHT SUBCLAVIAN ARTERY;  Surgeon: Carlin FORBES Haddock, MD;  Location: [facility] OR;  Service: Vascular;  Laterality: Right;   ENDARTERECTOMY Right 10/15/2014   Procedure: ENDARTERECTOMY RIGHT COMMON CAROTID ARTERY;  Surgeon: Carlin FORBES Haddock, MD;  Location: [facility] OR;  Service: Vascular;  Laterality: Right;   ESOPHAGOGASTRODUODENOSCOPY (EGD) WITH PROPOFOL  N/A 09/14/2023   Procedure: ESOPHAGOGASTRODUODENOSCOPY (EGD) WITH PROPOFOL ;   Surgeon: Jinny Carmine, MD;  Location: [facility] ENDOSCOPY;  Service: Endoscopy;  Laterality: N/A;   ESOPHAGOGASTRODUODENOSCOPY (EGD) WITH PROPOFOL  N/A 11/24/2023   Procedure: ESOPHAGOGASTRODUODENOSCOPY (EGD) WITH PROPOFOL ;  Surgeon: Jinny Carmine, MD;  Location: [facility] ENDOSCOPY;  Service: Endoscopy;  Laterality: N/A;   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   HERNIA REPAIR     LEFT HEART CATH AND CORONARY ANGIOGRAPHY N/A 03/17/2021   Procedure: LEFT HEART CATH AND CORONARY ANGIOGRAPHY;  Surgeon: Fernand Denyse LABOR, MD;  Location: [facility] INVASIVE CV LAB;  Service: Cardiovascular;  Laterality: N/A;   LEFT HEART CATHETERIZATION WITH CORONARY ANGIOGRAM N/A 09/12/2014   Procedure: LEFT HEART CATHETERIZATION WITH CORONARY ANGIOGRAM;  Surgeon: Maude JAYSON Emmer, MD;  Location: [facility] CATH LAB;  Service: Cardiovascular;  Laterality: N/A;   LUMBAR SPINE SURGERY  12/18/2013   L2     DR POOL   NECK SURGERY     PATCH ANGIOPLASTY Right 10/15/2014   Procedure: PATCH ANGIOPLASTY OF RIGHT SUBCLAVIAN ARTERY , RIGHT COMMON CAROTID ARTERY & INNOMINATE ARTERY;  Surgeon: Carlin FORBES Haddock, MD;  Location: [facility] OR;  Service: Vascular;  Laterality: Right;   STERNOTOMY N/A 10/15/2014   Procedure: PARTIAL STERNOTOMY & PLATING OF STERNUM;  Surgeon: Carlin FORBES Haddock, MD;  Location: [facility] OR;  Service: Vascular;  Laterality: N/A;   TEE WITHOUT CARDIOVERSION N/A 12/25/2013   Procedure: TRANSESOPHAGEAL ECHOCARDIOGRAM (TEE);  Surgeon: Ezra GORMAN Shuck, MD;  Location: [facility] ENDOSCOPY;  Service: Cardiovascular;  Laterality: N/A;   Social History:  reports that he quit smoking about 3 years ago. His smoking use included cigarettes. He started smoking about 51 years ago. He has a 96 pack-year smoking history. He has never used smokeless tobacco. He reports that he does not drink alcohol and does not use drugs.  Allergies  Allergen Reactions   Bee Venom Swelling   Tapentadol Shortness Of Breath    Other Reaction(s): sob/tachycardia     Family  history reviewed and not pertinent Family History  Problem Relation Age of Onset   Heart disease Father    Heart attack Father    Diabetes Paternal Grandmother        1 st degree relatives   Hypertension Paternal Grandmother    Diabetes Brother    Heart disease Brother    Muscular dystrophy Brother    Muscular dystrophy Sister    Prostate cancer Neg Hx    Kidney cancer Neg Hx    Bladder Cancer Neg Hx      Prior to Admission medications   Medication Sig Start Date End Date Taking? Authorizing Provider  albuterol  (VENTOLIN  HFA) 108 (90 Base) MCG/ACT inhaler INHALE 2 PUFFS BY MOUTH EVERY 4-6 HOURS AS NEEDED FOR WHEEZING 02/25/23  Yes Tamea Dedra CROME, MD  amLODipine  (NORVASC ) 5 MG tablet Take 5 mg by mouth daily. 06/02/24  Yes [provider]  aspirin  EC 81 MG tablet Take 81 mg by mouth daily.   Yes [provider]  atorvastatin  (LIPITOR ) 80 MG tablet TAKE ONE TABLET BY MOUTH ONCE DAILY AT BEDTIME 01/03/24  Yes  Fernand Denyse LABOR, MD  BREZTRI  AEROSPHERE 160-9-4.8 MCG/ACT AERO inhaler INHALE TWO PUFFS into THE lungs TWICE DAILY morning & bedtime 06/01/24  Yes Tamea Dedra CROME, MD  cefdinir  (OMNICEF ) 300 MG capsule Take 1 capsule (300 mg total) by mouth 2 (two) times daily. 12/07/23  Yes Viviann Pastor, MD  clopidogrel  (PLAVIX ) 75 MG tablet TAKE ONE TABLET BY MOUTH ONCE DAILY 01/03/24  Yes Fernand Denyse A, MD  EPINEPHrine 0.3 mg/0.3 mL IJ SOAJ injection Inject 0.3 mg into the muscle as needed for anaphylaxis. 04/04/20  Yes [provider]  famotidine (PEPCID) 40 MG tablet TAKE ONE TABLET BY MOUTH ONCE DAILY 01/03/24  Yes Fernand Denyse LABOR, MD  FLUoxetine  (PROZAC ) 20 MG capsule Take 20 mg by mouth as needed. 06/01/21  Yes [provider]  FLUoxetine  (PROZAC ) 40 MG capsule Take 40 mg by mouth daily. 09/15/23  Yes [provider]  fluticasone -salmeterol (ADVAIR) 250-50 MCG/ACT AEPB Inhale 1 puff into the lungs 2 (two) times daily. 06/14/24  Yes [provider]  furosemide  (LASIX ) 20 MG tablet TAKE ONE TABLET BY MOUTH EVERY DAY 01/03/24  Yes Fernand Denyse A, MD  ipratropium-albuterol  (DUONEB) 0.5-2.5 (3) MG/3ML SOLN Take 3 mLs by nebulization every 6 (six) hours as needed. 03/01/24  Yes Tamea Dedra CROME, MD  isosorbide  mononitrate (IMDUR ) 30 MG 24 hr tablet Take 30 mg by mouth daily.   Yes [provider]  lansoprazole  (PREVACID ) 30 MG capsule Take 30 mg by mouth.   Yes [provider]  levocetirizine (XYZAL ) 5 MG tablet Take 5 mg by mouth at bedtime. 07/15/20  Yes [provider]  linaclotide  (LINZESS ) 290 MCG CAPS capsule Take 290 mcg by mouth daily before breakfast. 12/05/19  Yes [provider]  lisinopril -hydrochlorothiazide  (ZESTORETIC ) 20-25 MG tablet TAKE ONE TABLET BY MOUTH ONCE DAILY 01/03/24  Yes Fernand Denyse LABOR, MD  meloxicam  (MOBIC ) 15 MG tablet Take 15 mg by mouth daily. 09/15/23  Yes [provider]  metFORMIN  (GLUCOPHAGE ) 500 MG tablet Take 500 mg by mouth daily. 07/22/20  Yes [provider]  montelukast  (SINGULAIR ) 10 MG tablet Take 10 mg by mouth daily. 07/15/20  Yes [provider]  Multiple Vitamin (MULTI-VITAMINS) TABS Take 1 tablet by mouth daily.   Yes [provider]  naloxone (NARCAN) nasal spray 4 mg/0.1 mL Place 0.4 mg into the nose once. 09/02/16  Yes [provider]  nitroGLYCERIN  (NITROSTAT ) 0.4 MG SL tablet DISSOLVE (1) TABLET UNDER TONGUE AS NEEDED TO RELIEVE CHEST PAIN. MAY REPEAT EVERY 5 MINUTES. 05/06/23  Yes Fernand Denyse LABOR, MD  ondansetron  (ZOFRAN ) 4 MG tablet Take by mouth. 02/04/22  Yes [provider]  oxyCODONE  (ROXICODONE ) 15 MG immediate release tablet Take 15 mg by mouth every 4 (four) hours. 10/09/21  Yes [provider]  pantoprazole  (PROTONIX ) 40 MG tablet TAKE ONE TABLET BY MOUTH TWICE DAILY BEFORE MEALS 06/18/24  Yes Jinny Carmine, MD  polyethylene glycol (MIRALAX  / GLYCOLAX ) packet Take 17 g by mouth  daily as needed (for constipation).   Yes [provider]  pregabalin  (LYRICA ) 150 MG capsule Take 150 mg by mouth daily. 09/20/19  Yes [provider]  rOPINIRole  (REQUIP ) 1 MG tablet Take 1 tablet by mouth at bedtime. 06/01/21  Yes [provider]  spironolactone (ALDACTONE) 25 MG tablet TAKE ONE TABLET BY MOUTH ONCE DAILY 01/03/24  Yes Fernand Denyse A, MD  tamsulosin  (FLOMAX ) 0.4 MG CAPS capsule TAKE ONE CAPSULE BY MOUTH EVERY DAY 03/07/23  Yes McGowan,  Shannon A, PA-C  Dupilumab  (DUPIXENT ) 300 MG/2ML SOAJ Inject 300 mg into the skin every 14 (fourteen) days. Patient not taking: Reported on 08/05/2024 03/02/24   Tamea Dedra CROME, MD  methylPREDNISolone  (MEDROL  DOSEPAK) 4 MG TBPK tablet Take as directed in the package, this is a taper pack. Patient not taking: Reported on 08/05/2024 03/01/24   Tamea Dedra CROME, MD  OXYGEN Inhale 3 L into the lungs.    [provider]    Physical Exam: Vitals:   08/05/24 1045 08/05/24 1106 08/05/24 1128 08/05/24 1230  BP:  120/67  (!) 142/73  Pulse: 80 80  82  Resp: 17 16  17   Temp:    98.5 F (36.9 C)  TempSrc:    Oral  SpO2: 96% 97% 97% 100%  Weight:      Height:        General: alert and oriented to time, place, and person. Appear in mild distress, affect appropriate Eyes: PERRLA, Conjunctiva normal ENT: Oral Mucosa Clear, moist  Neck: no JVD, no Abnormal Mass Or lumps Cardiovascular: S1 and S2 Present, no Murmur, peripheral pulses symmetrical Respiratory: increased respiratory effort, Bilateral Air entry equal and Decreased, signs of accessory muscle use, mild Crackles, mild wheezes bilaterally Abdomen: Bowel Sound present, Soft and no tenderness. Skin: no rashes  Extremities: Right foot swelling, erythema and tenderness extending slightly up above ankle. LLE no Pedal edema, no calf tenderness Neurologic: without any new focal findings Gait not checked due to patient safety concerns  Data Reviewed: I have  personally reviewed and interpreted labs, imaging as discussed below.  CBC: Recent Labs  Lab 08/05/24 0846  WBC 14.8*  HGB 10.4*  HCT 32.2*  MCV 86.3  PLT 361   Basic Metabolic Panel: Recent Labs  Lab 08/05/24 0846  NA 134*  K 2.5*  CL 90*  CO2 28  GLUCOSE 144*  BUN 10  CREATININE 1.15  CALCIUM  8.6*  MG 1.3*  PHOS 2.7   GFR: Estimated Creatinine Clearance: 65.5 mL/min (by C-G formula based on SCr of 1.15 mg/dL). Liver Function Tests: No results for input(s): AST, ALT, ALKPHOS, BILITOT, PROT, ALBUMIN in the last 168 hours. No results for input(s): LIPASE, AMYLASE in the last 168 hours. No results for input(s): AMMONIA in the last 168 hours. Coagulation Profile: No results for input(s): INR, PROTIME in the last 168 hours. Cardiac Enzymes: Recent Labs  Lab 08/05/24 0846  CKTOTAL 176   BNP (last 3 results) No results for input(s): PROBNP in the last 8760 hours. HbA1C: No results for input(s): HGBA1C in the last 72 hours. CBG: No results for input(s): GLUCAP in the last 168 hours. Lipid Profile: Recent Labs    08/05/24 0846  CHOL 121  HDL 46  LDLCALC 53  TRIG 112  CHOLHDL 2.6   Thyroid  Function Tests: No results for input(s): TSH, T4TOTAL, FREET4, T3FREE, THYROIDAB in the last 72 hours. Anemia Panel: No results for input(s): VITAMINB12, FOLATE, FERRITIN, TIBC, IRON, RETICCTPCT in the last 72 hours. Urine analysis:    Component Value Date/Time   COLORURINE AMBER (A) 12/07/2023 1147   APPEARANCEUR CLOUDY (A) 12/07/2023 1147   APPEARANCEUR Cloudy (A) 02/09/2022 1025   LABSPEC 1.016 12/07/2023 1147   LABSPEC 1.014 06/27/2013 2105   PHURINE 6.0 12/07/2023 1147   GLUCOSEU NEGATIVE 12/07/2023 1147   GLUCOSEU Negative 06/27/2013 2105   GLUCOSEU NEG mg/dL 96/87/7989 7891   HGBUR SMALL (A) 12/07/2023 1147   BILIRUBINUR NEGATIVE 12/07/2023 1147   BILIRUBINUR Negative 02/09/2022 1025  BILIRUBINUR Negative  06/27/2013 2105   KETONESUR NEGATIVE 12/07/2023 1147   PROTEINUR 30 (A) 12/07/2023 1147   UROBILINOGEN 1.0 10/19/2014 1504   NITRITE NEGATIVE 12/07/2023 1147   LEUKOCYTESUR LARGE (A) 12/07/2023 1147   LEUKOCYTESUR Trace 06/27/2013 2105    Radiological Exams on Admission: DG Foot Complete Right Result Date: 08/05/2024 CLINICAL DATA:  68 year male with lower extremity swelling for 1 week. EXAM: RIGHT FOOT COMPLETE - 3+ VIEW COMPARISON:  None Available. FINDINGS: Three views. Bone mineralization is within normal limits for age. Calcaneus appears intact with degenerative spurring. Hallux valgus, metatarsus primus varus. Associated 1st MTP joint space loss. No acute osseous abnormality identified. No soft tissue gas or discrete soft tissue wound is identified. IMPRESSION: Chronic degenerative changes. No acute osseous abnormality identified. Electronically Signed   By: VEAR Hurst M.D.   On: 08/05/2024 09:50    EKG: Independently reviewed. normal EKG, normal sinus rhythm  I reviewed all nursing notes, pharmacy notes, vitals, pertinent old records.  Assessment/Plan Principal Problem:   Cellulitis of right foot Active Problems:   COPD with acute exacerbation (HCC)   Hypokalemia   Hypomagnesemia   # Right foot cellulitis Started empiric antibiotics ceftriaxone  1 g IV daily Procalcitonin negative Trend WBC count  # Hypokalemia secondary to diuretics Potassium repleted Held diuretics for now Check BMP daily  # Hypomagnesemia, mag repleted. Check electrolytes daily  # Acute on chronic hypoxic respiratory failure, COPD exacerbation Continue supplemental O2 inhalation Started Brovana  and Pulmicort  nebulizer DuoNeb every 6 hours Solu-Medrol  125 mg IV x 1 dose followed by 40 mg IV twice daily x 2 doses, followed by prednisone  40 mg p.o. daily for 3 days Started PPI for GI prophylaxis  # History of CVA, HTN Resumed aspirin  and Plavix  Resumed amlodipine  and Imdur  Monitor BP and  titrate medications accordingly  # Depression: Prozac   # Prediabetic, mild hyperglycemia Started NovoLog  sliding scale during hospital stay Remains at high risk for hyperglycemia due to steroid Continue diabetic diet Monitor CBG   Nutrition: Cardiac and Carb modified diet DVT Prophylaxis: Subcutaneous Lovenox   Advance goals of care discussion: Full code   Consults: None  Family Communication: family was not present at bedside, at the time of interview.  Opportunity was given to ask question and all questions were answered satisfactorily.  Disposition: Admitted as inpatient, telemetry unit. Likely to be discharged Home, in few days when stable.  I have discussed plan of care as described above with RN and patient/family.  Severity of Illness: The appropriate patient status for this patient is INPATIENT. Inpatient status is judged to be reasonable and necessary in order to provide the required intensity of service to ensure the patient's safety. The patient's presenting symptoms, physical exam findings, and initial radiographic and laboratory data in the context of their chronic comorbidities is felt to place them at high risk for further clinical deterioration. Furthermore, it is not anticipated that the patient will be medically stable for discharge from the hospital within 2 midnights of admission.   * I certify that at the point of admission it is my clinical judgment that the patient will require inpatient hospital care spanning beyond 2 midnights from the point of admission due to high intensity of service, high risk for further deterioration and high frequency of surveillance required.*   Author: ELVAN SOR, MD [facility] 08/05/2024 1:55 PM   To reach On-call, see care teams to locate the attending and reach out to them via [email or web address]. If 7PM-7AM, please contact  night-coverage If you still have difficulty reaching the attending provider, please page the [facility]  (Director on Call) for [facility] on amion for assistance.

## 2024-08-05 NOTE — ED Notes (Signed)
 EDP at bedside

## 2024-08-05 NOTE — ED Notes (Signed)
 Dr. Von at bedside. Pt complaining of burning from potassium. Dr. Von verbal order to dc and he will change order to PO.

## 2024-08-05 NOTE — ED Triage Notes (Signed)
 Pt to ED for right foot swelling at joint x1 week. Also reports pain to right knuckle. Unknown hx of gout.  Pt has hx of COPD, family reports pt seems to be more exerted. Wears 3-4 L chronic.  Pt declines chest xray at this time, reports breathing is normal and here for foot.

## 2024-08-05 NOTE — ED Provider Notes (Signed)
 [facility] Provider Note    Event Date/Time   First MD Initiated Contact with Patient 08/05/24 [phone]     (approximate)   History   Foot Pain   HPI  KALVEN GANIM is a 68 year old male with history of COPD on home oxygen presenting to the emergency department for evaluation of foot pain.  About a week ago patient began to notice pain along his right MTP joint.  Initially thought he may have hit his foot in bed.  Swelling and pain has waxed and waned since that time.  Now reports pain and swelling throughout his foot.  No prior known history of gout.  Does report that in the setting of his pain he has had poor p.o. intake but denies nausea or vomiting.  No fevers.      Physical Exam   Triage Vital Signs: ED Triage Vitals  Encounter Vitals Group     BP 08/05/24 0841 97/83     Girls Systolic BP Percentile --      Girls Diastolic BP Percentile --      Boys Systolic BP Percentile --      Boys Diastolic BP Percentile --      Pulse Rate 08/05/24 0844 95     Resp 08/05/24 0841 (!) 24     Temp 08/05/24 0844 98.5 F (36.9 C)     Temp src --      SpO2 08/05/24 0844 94 %     Weight 08/05/24 0842 186 lb (84.4 kg)     Height 08/05/24 0842 5' 11 (1.803 m)     Head Circumference --      Peak Flow --      Pain Score 08/05/24 0842 10     Pain Loc --      Pain Education --      Exclude from Growth Chart --     Most recent vital signs: Vitals:   08/05/24 1045 08/05/24 1106  BP:  120/67  Pulse: 80 80  Resp: 17 16  Temp:    SpO2: 96% 97%     General: Awake, interactive  CV:  Regular rate, good peripheral perfusion.  Resp:  Unlabored respirations satting appropriately on home oxygen.  Abd:  Nondistended Neuro:  Symmetric facial movement, fluid speech MSK:  There are intact DP pulses bilaterally.  There is swelling of the right foot compared to the contralateral side with tenderness most notably over the right MTP joint.   ED Results / Procedures  / Treatments   Labs (all labs ordered are listed, but only abnormal results are displayed) Labs Reviewed  CBC - Abnormal; Notable for the following components:      Result Value   WBC 14.8 (*)    RBC 3.73 (*)    Hemoglobin 10.4 (*)    HCT 32.2 (*)    All other components within normal limits  BASIC METABOLIC PANEL WITH GFR - Abnormal; Notable for the following components:   Sodium 134 (*)    Potassium 2.5 (*)    Chloride 90 (*)    Glucose, Bld 144 (*)    Calcium  8.6 (*)    Anion gap 16 (*)    All other components within normal limits  MAGNESIUM  - Abnormal; Notable for the following components:   Magnesium  1.3 (*)    All other components within normal limits     EKG EKG independently reviewed and interpreted by myself demonstrates:  EKG demonstrate sinus rhythm at rate  of 82, PR 187, QRS 108, QTc 491, no acute ST changes  RADIOLOGY Imaging independently reviewed and interpreted by myself demonstrates:  Chest x-Montzerrat Brunell without acute abnormality  Formal Radiology Read:  DG Foot Complete Right Result Date: 08/05/2024 CLINICAL DATA:  68 year male with lower extremity swelling for 1 week. EXAM: RIGHT FOOT COMPLETE - 3+ VIEW COMPARISON:  None Available. FINDINGS: Three views. Bone mineralization is within normal limits for age. Calcaneus appears intact with degenerative spurring. Hallux valgus, metatarsus primus varus. Associated 1st MTP joint space loss. No acute osseous abnormality identified. No soft tissue gas or discrete soft tissue wound is identified. IMPRESSION: Chronic degenerative changes. No acute osseous abnormality identified. Electronically Signed   By: VEAR Hurst M.D.   On: 08/05/2024 09:50    PROCEDURES:  Critical Care performed: Yes, see critical care procedure note(s)  CRITICAL CARE Performed by: Nilsa Dade   Total critical care time: 32 minutes  Critical care time was exclusive of separately billable procedures and treating other patients.  Critical care  was necessary to treat or prevent imminent or life-threatening deterioration.  Critical care was time spent personally by me on the following activities: development of treatment plan with patient and/or surrogate as well as nursing, discussions with consultants, evaluation of patient's response to treatment, examination of patient, obtaining history from patient or surrogate, ordering and performing treatments and interventions, ordering and review of laboratory studies, ordering and review of radiographic studies, pulse oximetry and re-evaluation of patient's condition.   Procedures   MEDICATIONS ORDERED IN ED: Medications  potassium chloride  10 mEq in 100 mL IVPB (10 mEq Intravenous New Bag/Given 08/05/24 1111)  fentaNYL  (SUBLIMAZE ) injection 50 mcg (has no administration in time range)  oxyCODONE  (Oxy IR/ROXICODONE ) immediate release tablet 15 mg (15 mg Oral Given 08/05/24 0910)  potassium chloride  SA (KLOR-CON  M) CR tablet 40 mEq (40 mEq Oral Given 08/05/24 0949)  0.9 %  sodium chloride  infusion ( Intravenous Restarted 08/05/24 1009)  magnesium  sulfate IVPB 2 g 50 mL (0 g Intravenous Stopped 08/05/24 1118)  colchicine  tablet 1.2 mg (1.2 mg Oral Given 08/05/24 1111)     IMPRESSION / MDM / ASSESSMENT AND PLAN / ED COURSE  I reviewed the triage vital signs and the nursing notes.  Differential diagnosis includes, but is not limited to, gout, lower suspicion septic joint given location, time course, absence of fevers,, c consideration for traumatic injury onsideration for electrolyte abnormality in the setting of poor p.o. intake  Patient's presentation is most consistent with acute presentation with potential threat to life or bodily function.  68 year old male presenting to the ER for evaluation of foot pain and poor p.o. intake.  Tachypneic on presentation, but denies any changes in his respiratory status.  CBC does demonstrate leukocytosis, likely reactive in the setting of his foot injury.   With joint swelling localized to the MTP joint and clinical course, I still suspect that this is likely reflective of gout and do not feel arthrocentesis indicated at this time.  If the patient's clinical status were to worsen or he were to develop systemic symptoms like fever, likely would be appropriate at that time.  He was ordered for his home dose of oxycodone  as well as colchicine .  Labs did demonstrate multiple abnormalities including critically low potassium at 2.5.  Magnesium  also low at 1.3.  Discussed with patient.  Has not had vomiting or diarrhea, but has not been eating over the past few days which she reports has happened in the  setting of acute pain previously.  Does appear that he has had hypokalemia in the past but not this severe.  EKG with borderline QTc prolongation.  Do think patient is appropriate for admission in the setting of this.  Reach out to hospitalist team.  Clinical Course as of 08/05/24 1123  Sun Aug 05, 2024  1122 Case discussed with hospitalist team.  They will evaluate for anticipated admission. [NR]    Clinical Course User Index [NR] Levander Slate, MD     FINAL CLINICAL IMPRESSION(S) / ED DIAGNOSES   Final diagnoses:  Foot pain, right  Hypokalemia  Hypomagnesemia     Rx / DC Orders   ED Discharge Orders     None        Note:  This document was prepared using Dragon voice recognition software and may include unintentional dictation errors.   Levander Slate, MD 08/05/24 [phone]

## 2024-08-05 NOTE — ED Notes (Signed)
 Pt asking for pain med. Informed once EDP assesses, they can order.

## 2024-08-05 NOTE — ED Notes (Signed)
 Critical potassium 2.5 communicated to EDP Ray.

## 2024-08-05 NOTE — ED Notes (Signed)
 Pt assisted to side of bed to use urinal. Pt is unable to bear weight on his right foot.

## 2024-08-05 NOTE — ED Notes (Signed)
 Pt desaturated to low 80s. Turned to 5L [place]. Was on 4L.

## 2024-08-05 NOTE — ED Notes (Signed)
 IV infiltrated, infusions paused and will start new IV.

## 2024-08-06 DIAGNOSIS — L03115 Cellulitis of right lower limb: Secondary | ICD-10-CM | POA: Diagnosis not present

## 2024-08-06 LAB — FOLATE: Folate: 8.3 ng/mL (ref >5.9)

## 2024-08-06 LAB — BASIC METABOLIC PANEL WITH GFR
Anion gap: 10 (ref 5–15)
BUN: 15 mg/dL (ref 8–23)
CO2: 29 mmol/L (ref 22–32)
Calcium: 8.5 mg/dL — ABNORMAL LOW (ref 8.9–10.3)
Chloride: 97 mmol/L — ABNORMAL LOW (ref 98–111)
Creatinine, Ser: 0.84 mg/dL (ref 0.61–1.24)
GFR, Estimated: >60 mL/min
Glucose, Bld: 146 mg/dL — ABNORMAL HIGH (ref 70–99)
Potassium: 3.8 mmol/L (ref 3.5–5.1)
Sodium: 136 mmol/L (ref 135–145)

## 2024-08-06 LAB — CBG MONITORING, ED
Glucose-Capillary: 123 mg/dL — ABNORMAL HIGH (ref 70–99)
Glucose-Capillary: 162 mg/dL — ABNORMAL HIGH (ref 70–99)
Glucose-Capillary: 157 mg/dL — ABNORMAL HIGH (ref 70–99)

## 2024-08-06 LAB — MAGNESIUM: Magnesium: 2.5 mg/dL — ABNORMAL HIGH (ref 1.7–2.4)

## 2024-08-06 LAB — PHOSPHORUS: Phosphorus: 1.8 mg/dL — ABNORMAL LOW (ref 2.5–4.6)

## 2024-08-06 LAB — CBC
HCT: 27.5 % — ABNORMAL LOW (ref 39.0–52.0)
Hemoglobin: 8.9 g/dL — ABNORMAL LOW (ref 13.0–17.0)
MCH: 27.9 pg (ref 26.0–34.0)
MCHC: 32.4 g/dL (ref 30.0–36.0)
MCV: 86.2 fL (ref 80.0–100.0)
Platelets: 297 K/uL (ref 150–400)
RBC: 3.19 MIL/uL — ABNORMAL LOW (ref 4.22–5.81)
RDW: 13.2 % (ref 11.5–15.5)
WBC: 18.2 K/uL — ABNORMAL HIGH (ref 4.0–10.5)
nRBC: 0 % (ref 0.0–0.2)

## 2024-08-06 LAB — IRON AND TIBC
Iron: 29 ug/dL — ABNORMAL LOW (ref 45–182)
Saturation Ratios: 12 % — ABNORMAL LOW (ref 17.9–39.5)
TIBC: 234 ug/dL — ABNORMAL LOW (ref 250–450)
UIBC: 205 ug/dL

## 2024-08-06 LAB — C-REACTIVE PROTEIN: CRP: 14.8 mg/dL — ABNORMAL HIGH (ref <1.0)

## 2024-08-06 MED ORDER — OXYCODONE HCL 5 MG PO TABS
15.0000 mg | ORAL_TABLET | ORAL | Status: DC | PRN
  Administered 2024-08-06 – 2024-08-07 (×5): 15 mg via ORAL
  Filled 2024-08-06 (×5): qty 3

## 2024-08-06 MED ORDER — FE FUM-VIT C-VIT B12-FA 460-60-0.01-1 MG PO CAPS
1.0000 | ORAL_CAPSULE | Freq: Every day | ORAL | Status: DC
  Administered 2024-08-07: 1 via ORAL
  Filled 2024-08-06: qty 1

## 2024-08-06 MED ORDER — NALOXONE HCL 0.4 MG/ML IJ SOLN: 0.4000 mg | INTRAMUSCULAR | Status: DC | PRN

## 2024-08-06 MED ORDER — POTASSIUM PHOSPHATES 15 MMOLE/5ML IV SOLN
30.0000 mmol | Freq: Once | INTRAVENOUS | Status: AC
  Administered 2024-08-06: 30 mmol via INTRAVENOUS
  Filled 2024-08-06: qty 10

## 2024-08-06 NOTE — ED Notes (Signed)
   08/06/24 1950  Intentional Rounding  Assessment Alert;Patient comfortable;Vital signs WDL  Intervention Introduced self to pt/other;Toileting offered;Call light w/in reach;Pain assessed  Pain Assessment  Pain Assessment 0-10  Pain Score 8  Pain Location Foot  Pain Orientation Left;Right

## 2024-08-06 NOTE — Evaluation (Signed)
 Occupational Therapy Evaluation Patient Details Name: Bradley Sawyer MRN: [id number] DOB: [birth year] Today's Date: 08/06/2024   History of Present Illness   68 y.o. male with PMH of chronic hypoxic respiratory failure on 3 to 4 L oxygen, COPD, HTN, CVA, PAD, carotid stenosis, pre-DM, BPH, chronic L2 fracture, as reviewed from EMR, presented to [facility] ED with complaining of right foot pain.  As per patient he noticed right MTP joint swelling a week ago, initially he thought that he hit his foot on the bed and swelling and pain will go away but it got worse.  Patient noticed swelling and pain in his getting worse so he came into the ED.  No prior history of gout     Clinical Impressions Patient presenting with decreased Ind in self care,balance, functional mobility/transfers, endurance, and safety awareness. Patient reports being Ind at living at home with family. Pt is Ind with ADLs but family performs IADLs.  Patient currently functioning at supervision - min A for bed mobility and is able to stand and ambulate in room without significant pain this session. Pt was previously not able to bear weight on R LE secondary to increased pain. He is motivated to return home.Patient will benefit from acute OT to increase overall independence in the areas of ADLs, functional mobility, and safety awareness in order to safely discharge.     If plan is discharge home, recommend the following:   A little help with walking and/or transfers;A little help with bathing/dressing/bathroom;Help with stairs or ramp for entrance;Assist for transportation     Functional Status Assessment   Patient has had a recent decline in their functional status and demonstrates the ability to make significant improvements in function in a reasonable and predictable amount of time.     Equipment Recommendations   Other (comment) (2WW)      Precautions/Restrictions   Precautions Precautions: Fall     Mobility Bed  Mobility Overal bed mobility: Needs Assistance Bed Mobility: Supine to Sit, Sit to Supine     Supine to sit: Supervision Sit to supine: Supervision        Transfers Overall transfer level: Needs assistance Equipment used: Rolling walker (2 wheels) Transfers: Sit to/from Stand Sit to Stand: Supervision, Contact guard assist                  Balance Overall balance assessment: Needs assistance Sitting-balance support: Feet supported Sitting balance-Leahy Scale: Good     Standing balance support: During functional activity, Bilateral upper extremity supported Standing balance-Leahy Scale: Fair                             ADL either performed or assessed with clinical judgement   ADL Overall ADL's : Needs assistance/impaired                         Toilet Transfer: Contact guard assist;Rolling walker (2 wheels)                   Vision Patient Visual Report: No change from baseline              Pertinent Vitals/Pain Pain Assessment Pain Assessment: 0-10 Pain Score: 3  Pain Location: R foot Pain Descriptors / Indicators: Aching, Discomfort Pain Intervention(s): Limited activity within patient's tolerance, Monitored during session, Repositioned     Extremity/Trunk Assessment Upper Extremity Assessment Upper Extremity Assessment: Overall WFL for tasks assessed  Lower Extremity Assessment Lower Extremity Assessment: Defer to PT evaluation       Communication Communication Communication: No apparent difficulties   Cognition Arousal: Alert Behavior During Therapy: WFL for tasks assessed/performed Cognition: No apparent impairments                               Following commands: Intact                  Home Living Family/patient expects to be discharged to:: Private residence Living Arrangements: Children Available Help at Discharge: Family;Available 24 hours/day Type of Home: Mobile home Home  Access: Stairs to enter Entrance Stairs-Number of Steps: 3 Entrance Stairs-Rails: Right Home Layout: One level     Bathroom Shower/Tub: Chief Strategy Officer: Standard     Home Equipment: Cane - single point   Additional Comments: Pt is on 3.5-4Ls at baseline      Prior Functioning/Environment Prior Level of Function : Independent/Modified Independent                    OT Problem List: Decreased strength;Decreased safety awareness;Decreased activity tolerance;Impaired balance (sitting and/or standing);Decreased knowledge of precautions;Cardiopulmonary status limiting activity   OT Treatment/Interventions: Self-care/ADL training;Therapeutic activities;Patient/family education;Balance training;Therapeutic exercise;Energy conservation      OT Goals(Current goals can be found in the care plan section)   Acute Rehab OT Goals Patient Stated Goal: to go home and decrease pain OT Goal Formulation: With patient Time For Goal Achievement: 08/20/24 Potential to Achieve Goals: Fair ADL Goals Pt Will Perform Grooming: with modified independence;standing Pt Will Perform Lower Body Dressing: with modified independence;sit to/from stand Pt Will Transfer to Toilet: with modified independence;ambulating Pt Will Perform Toileting - Clothing Manipulation and hygiene: with modified independence;sit to/from stand   OT Frequency:  Min 2X/week       AM-PAC OT 6 Clicks Daily Activity     Outcome Measure Help from another person eating meals?: None Help from another person taking care of personal grooming?: A Little Help from another person toileting, which includes using toliet, bedpan, or urinal?: A Little Help from another person bathing (including washing, rinsing, drying)?: A Little Help from another person to put on and taking off regular upper body clothing?: None Help from another person to put on and taking off regular lower body clothing?: A Little 6 Click  Score: 20   End of Session Equipment Utilized During Treatment: Rolling walker (2 wheels) Nurse Communication: Mobility status  Activity Tolerance: Patient tolerated treatment well Patient left: in bed;with call bell/phone within reach;with bed alarm set  OT Visit Diagnosis: Unsteadiness on feet (R26.81);Repeated falls (R29.6);Muscle weakness (generalized) (M62.81)                Time: 8886-8865 OT Time Calculation (min): 21 min Charges:  OT General Charges $OT Visit: 1 Visit OT Evaluation $OT Eval Moderate Complexity: 1 Mod OT Treatments $Therapeutic Activity: 8-22 mins  Izetta Claude, MS, OTR/L , CBIS ascom [phone]  08/06/24, 4:16 PM

## 2024-08-06 NOTE — Progress Notes (Signed)
 [facility] Progress Note  Patient: Bradley Sawyer    FMW:[id number]  DOA: 08/05/2024     Date of Service: the patient was seen and examined on 08/06/2024  Chief Complaint  Patient presents with   Foot Pain   Brief hospital course: Bradley Sawyer is a 68 y.o. male with PMH of chronic hypoxic respiratory failure on 3 to 4 L oxygen, COPD, HTN, CVA, PAD, carotid stenosis, pre-DM, BPH, chronic L2 fracture, as reviewed from EMR, presented to [facility] ED with complaining of right foot pain.  As per patient he noticed right MTP joint swelling a week ago, initially he thought that he hit his foot on the bed and swelling and pain will go away but it got worse.  Patient noticed swelling and pain in his getting worse so he came into the ED.  No prior history of gout.  Patient denies any other complaints.   ED Course: VS afebrile, HR 95, RR 24, BP 97/8394% on 4 L BMP sodium 134, hypokalemia potassium 2.5, BG 144 Hypomagnesemia mag 1.3 CBC WBC 14.8, Hb 10.4 ESR 108 Pro-Cal negative Uric acid 8.0 within normal range X-ray right foot: Chronic degenerative changes. No acute osseous abnormality identified.   Assessment and Plan:  # Right foot cellulitis Started empiric antibiotics ceftriaxone  1 g IV daily Procalcitonin negative Trend WBC count   # Hypokalemia secondary to diuretics Potassium repleted Held diuretics for now Check BMP daily   # Hypomagnesemia, mag repleted. # Hypophosphatemia, Phos repleted. Check electrolytes daily    # Acute on chronic hypoxic respiratory failure, COPD exacerbation Continue supplemental O2 inhalation Started Brovana  and Pulmicort  nebulizer DuoNeb every 6 hours Solu-Medrol  125 mg IV x 1 dose followed by 40 mg IV twice daily x 2 doses, followed by prednisone  40 mg p.o. daily for 3 days Started PPI for GI prophylaxis   # History of CVA, HTN Resumed aspirin  and Plavix  Resumed amlodipine  and Imdur  Monitor BP and titrate medications accordingly   #  Depression: Prozac    # Prediabetic, mild hyperglycemia Started NovoLog  sliding scale during hospital stay Remains at high risk for hyperglycemia due to steroid Continue diabetic diet Monitor CBG  # Iron deficiency, Tsat 12%, started oral iron supplement with vitamin C.  Follow-up with PCP to repeat iron profile after 3 to 6 months Folic acid level 8.3 within normal range   Body mass index is 25.94 kg/m.  Interventions:  Diet: Carb modified/heart healthy DVT Prophylaxis: Subcutaneous Lovenox    Advance goals of care discussion: Full code  Family Communication: family was not present at bedside, at the time of interview.  The pt provided permission to discuss medical plan with the family. Opportunity was given to ask question and all questions were answered satisfactorily.   Disposition:  Pt is from Home, admitted with right foot cellulitis and COPD, electrolyte imbalance, still has cellulitis and on IV antibiotics, which precludes a safe discharge. Discharge to home, when stable, most likely in 1 to 2 days.  Subjective: No significant events overnight, patient was complaining of pain in the bilateral hip with sciatica which is chronic.  Patient is oxycodone  50 mg every 4 hourly, requesting to restart pain medications Right foot pain is getting better, did not sleep last night Breathing is getting better.   Physical Exam: General: NAD, lying comfortably Appear in no distress, affect appropriate Eyes: PERRLA ENT: Oral Mucosa Clear, moist  Neck: no JVD,  Cardiovascular: S1 and S2 Present, no Murmur,  Respiratory: good respiratory effort, Bilateral Air  entry equal and Decreased, no Crackles, mild wheezes, significantly improved as compared to yesterday Abdomen: Bowel Sound present, Soft and no tenderness,  Skin: no rashes Extremities: Right foot erythema and tenderness improving, mild  edema, no calf tenderness. LLE wnl Neurologic: without any new focal findings Gait not  checked due to patient safety concerns  Vitals:   08/06/24 1015 08/06/24 1030 08/06/24 1039 08/06/24 1338  BP:  117/75 117/75   Pulse: (!) 150 73 73   Resp:   16   Temp:    97.9 F (36.6 C)  TempSrc:      SpO2: 92% 96% 96%   Weight:      Height:        Intake/Output Summary (Last 24 hours) at 08/06/2024 1350 Last data filed at 08/05/2024 1731 Gross per 24 hour  Intake --  Output 500 ml  Net -500 ml   Filed Weights   08/05/24 0842  Weight: 84.4 kg    Data Reviewed: I have personally reviewed and interpreted daily labs, tele strips, imagings as discussed above. I reviewed all nursing notes, pharmacy notes, vitals, pertinent old records I have discussed plan of care as described above with RN and patient/family.  CBC: Recent Labs  Lab 08/05/24 0846 08/06/24 0410  WBC 14.8* 18.2*  HGB 10.4* 8.9*  HCT 32.2* 27.5*  MCV 86.3 86.2  PLT 361 297   Basic Metabolic Panel: Recent Labs  Lab 08/05/24 0846 08/06/24 0410  NA 134* 136  K 2.5* 3.8  CL 90* 97*  CO2 28 29  GLUCOSE 144* 146*  BUN 10 15  CREATININE 1.15 0.84  CALCIUM  8.6* 8.5*  MG 1.3* 2.5*  PHOS 2.7 1.8*    Studies: No results found.  Scheduled Meds:  amLODipine   5 mg Oral Daily   arformoterol   15 mcg Nebulization BID   aspirin  EC  81 mg Oral Daily   budesonide  (PULMICORT ) nebulizer solution  0.25 mg Nebulization BID   clopidogrel   75 mg Oral Daily   enoxaparin  (LOVENOX ) injection  40 mg Subcutaneous Q24H   FLUoxetine   40 mg Oral Daily   insulin  aspart  0-9 Units Subcutaneous TID WC   isosorbide  mononitrate  30 mg Oral Daily   loratadine   10 mg Oral Daily   methylPREDNISolone  (SOLU-MEDROL ) injection  40 mg Intravenous Q12H   Followed by   NOREEN ON 08/07/2024] predniSONE   40 mg Oral Q breakfast   montelukast   10 mg Oral Daily   multivitamin with minerals  1 tablet Oral Daily   pantoprazole   40 mg Oral Daily   pregabalin   150 mg Oral Daily   rOPINIRole   1 mg Oral QHS   sodium chloride  flush  3  mL Intravenous Q12H   sodium chloride  flush  3 mL Intravenous Q12H   tamsulosin   0.4 mg Oral Daily   Continuous Infusions:  azithromycin  Stopped (08/05/24 1633)   cefTRIAXone  (ROCEPHIN )  IV 1 g (08/06/24 1337)   potassium PHOSPHATE  IVPB (in mmol) 30 mmol (08/06/24 1020)   PRN Meds: acetaminophen  **OR** acetaminophen , bisacodyl , ipratropium-albuterol , morphine  injection, naLOXone  (NARCAN )  injection, nitroGLYCERIN , oxyCODONE , polyethylene glycol, sodium chloride  flush  Time spent: 55 minutes  Author: ELVAN SOR. MD [facility] 08/06/2024 1:50 PM  To reach On-call, see care teams to locate the attending and reach out to them via [email or web address]. If 7PM-7AM, please contact night-coverage If you still have difficulty reaching the attending provider, please page the [facility] (Director on Call) for [facility] on amion for assistance.

## 2024-08-06 NOTE — ED Notes (Addendum)
 Pt lying on right side asleep in bed. Pt with equal chest rise and fall.

## 2024-08-07 ENCOUNTER — Other Ambulatory Visit: Payer: Self-pay

## 2024-08-07 DIAGNOSIS — L03115 Cellulitis of right lower limb: Secondary | ICD-10-CM | POA: Diagnosis not present

## 2024-08-07 LAB — MAGNESIUM: Magnesium: 2.5 mg/dL — ABNORMAL HIGH (ref 1.7–2.4)

## 2024-08-07 LAB — CBC
HCT: 26.5 % — ABNORMAL LOW (ref 39.0–52.0)
Hemoglobin: 8.4 g/dL — ABNORMAL LOW (ref 13.0–17.0)
MCH: 27.2 pg (ref 26.0–34.0)
MCHC: 31.7 g/dL (ref 30.0–36.0)
MCV: 85.8 fL (ref 80.0–100.0)
Platelets: 300 K/uL (ref 150–400)
RBC: 3.09 MIL/uL — ABNORMAL LOW (ref 4.22–5.81)
RDW: 13.2 % (ref 11.5–15.5)
WBC: 25.9 K/uL — ABNORMAL HIGH (ref 4.0–10.5)
nRBC: 0 % (ref 0.0–0.2)

## 2024-08-07 LAB — BASIC METABOLIC PANEL WITH GFR
Anion gap: 9 (ref 5–15)
BUN: 21 mg/dL (ref 8–23)
CO2: 30 mmol/L (ref 22–32)
Calcium: 8.5 mg/dL — ABNORMAL LOW (ref 8.9–10.3)
Chloride: 95 mmol/L — ABNORMAL LOW (ref 98–111)
Creatinine, Ser: 0.79 mg/dL (ref 0.61–1.24)
GFR, Estimated: >60 mL/min (ref >60)
Glucose, Bld: 134 mg/dL — ABNORMAL HIGH (ref 70–99)
Potassium: 3.2 mmol/L — ABNORMAL LOW (ref 3.5–5.1)
Sodium: 134 mmol/L — ABNORMAL LOW (ref 135–145)

## 2024-08-07 LAB — PHOSPHORUS: Phosphorus: 2.8 mg/dL (ref 2.5–4.6)

## 2024-08-07 LAB — CBG MONITORING, ED: Glucose-Capillary: 133 mg/dL — ABNORMAL HIGH (ref 70–99)

## 2024-08-07 LAB — VITAMIN B12: Vitamin B-12: 177 pg/mL — ABNORMAL LOW (ref 180–914)

## 2024-08-07 LAB — VITAMIN D 25 HYDROXY (VIT D DEFICIENCY, FRACTURES): Vit D, 25-Hydroxy: 8.78 ng/mL — ABNORMAL LOW (ref 30–100)

## 2024-08-07 MED ORDER — POTASSIUM CHLORIDE CRYS ER 20 MEQ PO TBCR
40.0000 meq | EXTENDED_RELEASE_TABLET | ORAL | Status: DC
  Administered 2024-08-07: 40 meq via ORAL
  Filled 2024-08-07: qty 2

## 2024-08-07 MED ORDER — PREDNISONE 20 MG PO TABS
40.0000 mg | ORAL_TABLET | Freq: Every day | ORAL | 0 refills | Status: AC
  Filled 2024-08-07: qty 6, 3d supply, fill #0

## 2024-08-07 MED ORDER — FE FUM-VIT C-VIT B12-FA 460-60-0.01-1 MG PO CAPS
1.0000 | ORAL_CAPSULE | Freq: Every day | ORAL | 2 refills | Status: DC
  Filled 2024-08-07: qty 30, 30d supply, fill #0

## 2024-08-07 MED ORDER — AMOXICILLIN-POT CLAVULANATE 875-125 MG PO TABS
1.0000 | ORAL_TABLET | Freq: Two times a day (BID) | ORAL | 0 refills | Status: AC
Start: 2024-08-07 — End: 2024-08-12
  Filled 2024-08-07: qty 10, 5d supply, fill #0

## 2024-08-07 NOTE — Discharge Summary (Signed)
 [facility] Discharge Summary   Patient: Bradley Sawyer FMW:[id number]  PCP: Bobbette Coye LABOR, MD  Date of admission: 08/05/2024   Date of discharge:  08/07/2024     Discharge Diagnoses:  Principal Problem:   Cellulitis of right foot Active Problems:   COPD with acute exacerbation (HCC)   Hypokalemia   Hypomagnesemia   Admitted From: Home Disposition:  Home   Recommendations for Outpatient Follow-up:  F/u with PCP in 1 wk Repeat BMP in 1 wk Follow up LABS/TEST: CBC and BMP in wk  CT chest for Lung Cancer screening    Follow-up Information     Corrington, Kip A, MD Follow up in 1 week(s).   Specialty: Family Medicine Contact information: [address] [place] [address] [phone]                Diet recommendation: Cardiac and Carb modified diet  Activity: The patient is advised to gradually reintroduce usual activities, as tolerated  Discharge Condition: stable  Code Status: Full code   History of present illness: As per the H and P dictated on admission.  Hospital Course:  Bradley Sawyer is a 68 y.o. male with PMH of chronic hypoxic respiratory failure on 3 to 4 L oxygen, COPD, HTN, CVA, PAD, carotid stenosis, pre-DM, BPH, chronic L2 fracture, as reviewed from EMR, presented to [facility] ED with complaining of right foot pain.  As per patient he noticed right MTP joint swelling a week ago, initially he thought that he hit his foot on the bed and swelling and pain will go away but it got worse.  Patient noticed swelling and pain in his getting worse so he came into the ED.  No prior history of gout.  Patient denies any other complaints.   ED Course: VS afebrile, HR 95, RR 24, BP 97/8394% on 4 L BMP sodium 134, hypokalemia potassium 2.5, BG 144 Hypomagnesemia mag 1.3 CBC WBC 14.8, Hb 10.4 ESR 108 Pro-Cal negative Uric acid 8.0 within normal range X-ray right foot: Chronic degenerative changes. No acute osseous abnormality identified.      Assessment and Plan:  # Right foot cellulitis: s/p ceftriaxone  1 g IV daily.  Procalcitonin negative. WBC count elevated could be due to steroids. Patient was discharged on Augmentin  twice daily for 5 days Follow-up with PCP and repeat CBC after 1 week   # Hypokalemia secondary to diuretics. Potassium repleted Diuretics held during hospital stay.  Potassium 3.2 today, oral potassium was given before discharge. Follow-up with PCP to repeat CBC after 1 week  # Hypomagnesemia, mag repleted.  Resolved # Hypophosphatemia, Phos repleted.  Resolved  # Acute on chronic hypoxic respiratory failure, COPD exacerbation. Continue supplemental O2 inhalation S/p Brovana  and Pulmicort  nebulizer and DuoNeb every 6 hours prn given during hospital stay. S/p Solu-Medrol  125 mg IV x 1 dose followed by 40 mg IV twice daily x 2 doses, followed by prednisone  40 mg p.o. daily for 3 days. S/p PPI for GI prophylaxis.  Breathing improved and wheezing resolved.  Patient was discharged on prednisone  40 mg p.o. daily for 3 days and continued home inhalers.  Recommend to follow with PCP in 1 week.  # History of CVA, HTN: Resumed aspirin  and Plavix  Resumed amlodipine  and Imdur  Monitor BP and titrate medications accordingly   # Depression: Prozac  # Prediabetic, mild hyperglycemia S/p NovoLog  sliding scale during hospital stay Remains at high risk for hyperglycemia due to steroid Continue diabetic diet   # Iron  deficiency, Tsat 12%, started oral iron supplement with vitamin C.  Follow-up with PCP to repeat iron profile after 3 to 6 months Folic acid level 8.3 within normal range    Body mass index is 25.94 kg/m.  Nutrition Interventions:  - Patient was instructed, not to drive, operate heavy machinery, perform activities at heights, swimming or participation in water activities or provide baby sitting services while on Pain, Sleep and Anxiety Medications; until his outpatient Physician has advised to do so again.   - Also recommended to not to take more than prescribed Pain, Sleep and Anxiety Medications.  Patient was seen by physical therapy, who recommended Home health, but patient and family declined, patient is near to his baseline and has all the DME at home.  On the day of the discharge the patient's vitals were stable, and no other acute medical condition were reported by patient. the patient was felt safe to be discharge at Home.  Consultants: None Procedures: None  Discharge Exam: General: Appear in no distress, Oral Mucosa Clear, moist. Cardiovascular: S1 and S2 Present, no Murmur, Respiratory: normal respiratory effort, Bilateral Air entry present and no Crackles, no wheezes Abdomen: Bowel Sound present, Soft and no tenderness.   Extremities: Mild right foot erythema and tenderness, significantly improved.  LLE no Pedal edema, no calf tenderness Neurology: AAOx 4, no focal deficits affect appropriate.  Filed Weights   08/05/24 0842  Weight: 84.4 kg   Vitals:   08/07/24 0958 08/07/24 1002  BP:  111/77  Pulse:  84  Resp:  16  Temp: 97.6 F (36.4 C)   SpO2:  100%    DISCHARGE MEDICATION: Allergies as of 08/07/2024       Reactions   Bee Venom Swelling   Tapentadol Shortness Of Breath   Other Reaction(s): sob/tachycardia        Medication List     STOP taking these medications    cefdinir  300 MG capsule Commonly known as: OMNICEF    Dupixent  300 MG/2ML Soaj Generic drug: Dupilumab    meloxicam  15 MG tablet Commonly known as: MOBIC    methylPREDNISolone  4 MG Tbpk tablet Commonly known as: MEDROL  DOSEPAK       TAKE these medications    albuterol  108 (90 Base) MCG/ACT inhaler Commonly known as: VENTOLIN  HFA INHALE 2 PUFFS BY MOUTH EVERY 4-6 HOURS AS NEEDED FOR WHEEZING   amLODipine  5 MG tablet Commonly known as: NORVASC  Take 5 mg by mouth daily.   amoxicillin -clavulanate 875-125 MG tablet Commonly known as: AUGMENTIN  Take 1 tablet by mouth 2 (two)  times daily for 5 days.   aspirin  EC 81 MG tablet Take 81 mg by mouth daily.   atorvastatin  80 MG tablet Commonly known as: LIPITOR  TAKE ONE TABLET BY MOUTH ONCE DAILY AT BEDTIME   Breztri  Aerosphere 160-9-4.8 MCG/ACT Aero inhaler Generic drug: budesonide -glycopyrrolate -formoterol  INHALE TWO PUFFS into THE lungs TWICE DAILY morning & bedtime   clopidogrel  75 MG tablet Commonly known as: PLAVIX  TAKE ONE TABLET BY MOUTH ONCE DAILY   EPINEPHrine 0.3 mg/0.3 mL Soaj injection Commonly known as: EPI-PEN Inject 0.3 mg into the muscle as needed for anaphylaxis.   famotidine 40 MG tablet Commonly known as: PEPCID TAKE ONE TABLET BY MOUTH ONCE DAILY   Fe Fum-Vit C-Vit B12-FA Caps capsule Commonly known as: TRIGELS-F FORTE Take 1 capsule by mouth daily after breakfast. Start taking on: August 08, 2024   FLUoxetine  20 MG capsule Commonly known as: PROZAC  Take 20 mg by mouth as needed.   FLUoxetine   40 MG capsule Commonly known as: PROZAC  Take 40 mg by mouth daily.   fluticasone -salmeterol 250-50 MCG/ACT Aepb Commonly known as: ADVAIR Inhale 1 puff into the lungs 2 (two) times daily.   furosemide  20 MG tablet Commonly known as: LASIX  TAKE ONE TABLET BY MOUTH EVERY DAY   ipratropium-albuterol  0.5-2.5 (3) MG/3ML Soln Commonly known as: DUONEB Take 3 mLs by nebulization every 6 (six) hours as needed.   isosorbide  mononitrate 30 MG 24 hr tablet Commonly known as: IMDUR  Take 30 mg by mouth daily.   lansoprazole  30 MG capsule Commonly known as: PREVACID  Take 30 mg by mouth.   levocetirizine 5 MG tablet Commonly known as: XYZAL  Take 5 mg by mouth at bedtime.   linaclotide  290 MCG Caps capsule Commonly known as: LINZESS  Take 290 mcg by mouth daily before breakfast.   lisinopril -hydrochlorothiazide  20-25 MG tablet Commonly known as: ZESTORETIC  TAKE ONE TABLET BY MOUTH ONCE DAILY   metFORMIN  500 MG tablet Commonly known as: GLUCOPHAGE  Take 500 mg by mouth daily.    montelukast  10 MG tablet Commonly known as: SINGULAIR  Take 10 mg by mouth daily.   Multi-Vitamins Tabs Take 1 tablet by mouth daily.   naloxone  4 MG/0.1ML Liqd nasal spray kit Commonly known as: NARCAN  Place 0.4 mg into the nose once.   nitroGLYCERIN  0.4 MG SL tablet Commonly known as: NITROSTAT  DISSOLVE (1) TABLET UNDER TONGUE AS NEEDED TO RELIEVE CHEST PAIN. MAY REPEAT EVERY 5 MINUTES.   ondansetron  4 MG tablet Commonly known as: ZOFRAN  Take by mouth.   oxyCODONE  15 MG immediate release tablet Commonly known as: ROXICODONE  Take 15 mg by mouth every 4 (four) hours.   OXYGEN Inhale 3 L into the lungs.   pantoprazole  40 MG tablet Commonly known as: PROTONIX  TAKE ONE TABLET BY MOUTH TWICE DAILY BEFORE MEALS   polyethylene glycol 17 g packet Commonly known as: MIRALAX  / GLYCOLAX  Take 17 g by mouth daily as needed (for constipation).   predniSONE  20 MG tablet Commonly known as: DELTASONE  Take 2 tablets (40 mg total) by mouth daily with breakfast for 3 days. Start taking on: August 08, 2024   pregabalin  150 MG capsule Commonly known as: LYRICA  Take 150 mg by mouth daily.   rOPINIRole  1 MG tablet Commonly known as: REQUIP  Take 1 tablet by mouth at bedtime.   spironolactone 25 MG tablet Commonly known as: ALDACTONE TAKE ONE TABLET BY MOUTH ONCE DAILY   tamsulosin  0.4 MG Caps capsule Commonly known as: FLOMAX  TAKE ONE CAPSULE BY MOUTH EVERY DAY       Allergies  Allergen Reactions   Bee Venom Swelling   Tapentadol Shortness Of Breath    Other Reaction(s): sob/tachycardia   Discharge Instructions     Call MD for:  difficulty breathing, headache or visual disturbances   Complete by: As directed    Call MD for:  extreme fatigue   Complete by: As directed    Call MD for:  persistant dizziness or light-headedness   Complete by: As directed    Call MD for:  persistant nausea and vomiting   Complete by: As directed    Call MD for:  redness, tenderness,  or signs of infection (pain, swelling, redness, odor or green/yellow discharge around incision site)   Complete by: As directed    Call MD for:  severe uncontrolled pain   Complete by: As directed    Call MD for:  temperature >100.4   Complete by: As directed    Diet - low sodium  heart healthy   Complete by: As directed    Discharge instructions   Complete by: As directed    F/u with PCP in 1 wk Repeat BMP in 1 wk Needs Lung Cancer screening as an out patient   Increase activity slowly   Complete by: As directed        The results of significant diagnostics from this hospitalization (including imaging, microbiology, ancillary and laboratory) are listed below for reference.    Significant Diagnostic Studies: DG Foot Complete Right Result Date: 08/05/2024 CLINICAL DATA:  68 year male with lower extremity swelling for 1 week. EXAM: RIGHT FOOT COMPLETE - 3+ VIEW COMPARISON:  None Available. FINDINGS: Three views. Bone mineralization is within normal limits for age. Calcaneus appears intact with degenerative spurring. Hallux valgus, metatarsus primus varus. Associated 1st MTP joint space loss. No acute osseous abnormality identified. No soft tissue gas or discrete soft tissue wound is identified. IMPRESSION: Chronic degenerative changes. No acute osseous abnormality identified. Electronically Signed   By: VEAR Hurst M.D.   On: 08/05/2024 09:50    Microbiology: No results found for this or any previous visit (from the past 240 hours).   Labs: CBC: Recent Labs  Lab 08/05/24 0846 08/06/24 0410 08/07/24 0512  WBC 14.8* 18.2* 25.9*  HGB 10.4* 8.9* 8.4*  HCT 32.2* 27.5* 26.5*  MCV 86.3 86.2 85.8  PLT 361 297 300   Basic Metabolic Panel: Recent Labs  Lab 08/05/24 0846 08/06/24 0410 08/07/24 0512  NA 134* 136 134*  K 2.5* 3.8 3.2*  CL 90* 97* 95*  CO2 28 29 30   GLUCOSE 144* 146* 134*  BUN 10 15 21   CREATININE 1.15 0.84 0.79  CALCIUM  8.6* 8.5* 8.5*  MG 1.3* 2.5* 2.5*   PHOS 2.7 1.8* 2.8   Liver Function Tests: No results for input(s): AST, ALT, ALKPHOS, BILITOT, PROT, ALBUMIN in the last 168 hours. No results for input(s): LIPASE, AMYLASE in the last 168 hours. No results for input(s): AMMONIA in the last 168 hours. Cardiac Enzymes: Recent Labs  Lab 08/05/24 0846  CKTOTAL 176   BNP (last 3 results) No results for input(s): BNP in the last 8760 hours. CBG: Recent Labs  Lab 08/05/24 2017 08/06/24 0726 08/06/24 1139 08/06/24 1630 08/07/24 0726  GLUCAP 182* 123* 162* 157* 133*    Time spent: 35 minutes  Signed:  Elvan Sor  [facility] 08/07/2024 10:38 AM

## 2024-08-07 NOTE — Evaluation (Signed)
 Physical Therapy Evaluation Patient Details Name: Bradley Sawyer MRN: [id number] DOB: 1956/03/22 Today's Date: 08/07/2024  History of Present Illness  68 y.o. male with PMH of chronic hypoxic respiratory failure on 3 to 4 L oxygen, COPD, HTN, CVA, PAD, carotid stenosis, pre-DM, BPH, chronic L2 fracture, as reviewed from EMR, presented to [facility] ED with complaining of right foot pain.  As per patient he noticed right MTP joint swelling a week ago, initially he thought that he hit his foot on the bed and swelling and pain will go away but it got worse.  Patient noticed swelling and pain in his getting worse so he came into the ED.  No prior history of gout  Clinical Impression  Patient is agreeable to PT evaluation. He reports he is independent with mobility at baseline and occasionally uses a cane due to chronic back and hip pain.  Today the patient reports no pain in the foot with AROM or weight bearing. He was able to walk a short distance in the room without physical assistance required and no assistive device. He feels close to his baseline level of independence and is requesting to discharge home today if possible. PT will continue to follow while in the hospital.        If plan is discharge home, recommend the following: Help with stairs or ramp for entrance;Assist for transportation;Assistance with cooking/housework   Can travel by private vehicle        Equipment Recommendations None recommended by PT (patient is requesting a scooter)  Recommendations for Other Services       Functional Status Assessment Patient has had a recent decline in their functional status and demonstrates the ability to make significant improvements in function in a reasonable and predictable amount of time.     Precautions / Restrictions Precautions Precautions: Fall Recall of Precautions/Restrictions: Intact Restrictions Weight Bearing Restrictions Per Provider Order: No      Mobility  Bed  Mobility Overal bed mobility: Modified Independent                  Transfers Overall transfer level: Needs assistance Equipment used: None Transfers: Sit to/from Stand Sit to Stand: Supervision           General transfer comment: no hands pn assistance required    Ambulation/Gait Ambulation/Gait assistance: Supervision Gait Distance (Feet): 5 Feet Assistive device: None Gait Pattern/deviations: Step-through pattern Gait velocity: decreased     General Gait Details: no loss of balance with short distance walking in the room. patient declined walking in hallway. he declined need for rolling walker at this time. no pain in the right foot with activity  Stairs            Wheelchair Mobility     Tilt Bed    Modified Rankin (Stroke Patients Only)       Balance Overall balance assessment: Needs assistance Sitting-balance support: Feet supported Sitting balance-Leahy Scale: Good     Standing balance support: During functional activity, No upper extremity supported Standing balance-Leahy Scale: Fair                               Pertinent Vitals/Pain Pain Assessment Pain Assessment: Faces Faces Pain Scale: Hurts a little bit Pain Location: lower back and hips (chronic pain) Pain Descriptors / Indicators: Aching, Discomfort Pain Intervention(s): Limited activity within patient's tolerance, Monitored during session, Repositioned    Home Living Family/patient expects to  be discharged to:: Private residence Living Arrangements: Children Available Help at Discharge: Family;Available 24 hours/day Type of Home: Mobile home Home Access: Stairs to enter Entrance Stairs-Rails: Right Entrance Stairs-Number of Steps: 3   Home Layout: One level Home Equipment: Cane - single point;Crutches (possibly a walker) Additional Comments: Pt is on 3.5-4Ls at baseline    Prior Function Prior Level of Function : Independent/Modified Independent              Mobility Comments: using cane as needed       Extremity/Trunk Assessment   Upper Extremity Assessment Upper Extremity Assessment: Overall WFL for tasks assessed    Lower Extremity Assessment Lower Extremity Assessment: Overall WFL for tasks assessed (ROM R ankle appears WFL with no pain reported)       Communication   Communication Communication: No apparent difficulties    Cognition Arousal: Alert Behavior During Therapy: WFL for tasks assessed/performed   PT - Cognitive impairments: No apparent impairments                         Following commands: Intact       Cueing Cueing Techniques: Verbal cues     General Comments General comments (skin integrity, edema, etc.): patient is fatigued with standing activity, however preseume this is close to baseline. he feels ready to be discharged today per his report    Exercises     Assessment/Plan    PT Assessment Patient needs continued PT services  PT Problem List Decreased activity tolerance;Decreased balance;Decreased mobility       PT Treatment Interventions DME instruction;Gait training;Stair training;Functional mobility training;Therapeutic activities;Therapeutic exercise    PT Goals (Current goals can be found in the Care Plan section)  Acute Rehab PT Goals Patient Stated Goal: home today PT Goal Formulation: With patient Time For Goal Achievement: 08/21/24 Potential to Achieve Goals: Good    Frequency Min 1X/week     Co-evaluation               AM-PAC PT 6 Clicks Mobility  Outcome Measure Help needed turning from your back to your side while in a flat bed without using bedrails?: None Help needed moving from lying on your back to sitting on the side of a flat bed without using bedrails?: None Help needed moving to and from a bed to a chair (including a wheelchair)?: A Little Help needed standing up from a chair using your arms (e.g., wheelchair or bedside chair)?: A  Little Help needed to walk in hospital room?: A Little Help needed climbing 3-5 steps with a railing? : A Little 6 Click Score: 20    End of Session Equipment Utilized During Treatment: Oxygen Activity Tolerance: Patient tolerated treatment well Patient left: in bed;with call bell/phone within reach;with nursing/sitter in room   PT Visit Diagnosis: Muscle weakness (generalized) (M62.81);Difficulty in walking, not elsewhere classified (R26.2)    Time: 9080-9070 PT Time Calculation (min) (ACUTE ONLY): 10 min   Charges:   PT Evaluation $PT Eval Low Complexity: 1 Low   PT General Charges $$ ACUTE PT VISIT: 1 Visit         Randine Essex, PT, MPT   Randine LULLA Essex 08/07/2024, 9:54 AM

## 2024-08-08 ENCOUNTER — Other Ambulatory Visit: Payer: Self-pay

## 2024-08-20 ENCOUNTER — Other Ambulatory Visit: Payer: Self-pay

## 2024-08-22 ENCOUNTER — Other Ambulatory Visit: Payer: Self-pay

## 2024-09-06 ENCOUNTER — Other Ambulatory Visit: Payer: Self-pay

## 2024-09-06 ENCOUNTER — Emergency Department

## 2024-09-06 ENCOUNTER — Encounter: Payer: Self-pay | Admitting: Internal Medicine

## 2024-09-06 ENCOUNTER — Observation Stay

## 2024-09-06 ENCOUNTER — Observation Stay
Admission: EM | Admit: 2024-09-06 | Discharge: 2024-09-07 | Disposition: A | Discharge status: Home Health | Attending: Internal Medicine | Admitting: Internal Medicine

## 2024-09-06 DIAGNOSIS — D7289 Other specified disorders of white blood cells: Secondary | ICD-10-CM | POA: Insufficient documentation

## 2024-09-06 DIAGNOSIS — Z87891 Personal history of nicotine dependence: Secondary | ICD-10-CM | POA: Insufficient documentation

## 2024-09-06 DIAGNOSIS — I739 Peripheral vascular disease, unspecified: Secondary | ICD-10-CM | POA: Diagnosis present

## 2024-09-06 DIAGNOSIS — N179 Acute kidney failure, unspecified: Secondary | ICD-10-CM | POA: Diagnosis present

## 2024-09-06 DIAGNOSIS — K219 Gastro-esophageal reflux disease without esophagitis: Secondary | ICD-10-CM | POA: Diagnosis not present

## 2024-09-06 DIAGNOSIS — G894 Chronic pain syndrome: Secondary | ICD-10-CM

## 2024-09-06 DIAGNOSIS — D72829 Elevated white blood cell count, unspecified: Secondary | ICD-10-CM | POA: Diagnosis present

## 2024-09-06 DIAGNOSIS — Z79899 Other long term (current) drug therapy: Secondary | ICD-10-CM | POA: Diagnosis not present

## 2024-09-06 DIAGNOSIS — F32A Depression, unspecified: Secondary | ICD-10-CM | POA: Diagnosis not present

## 2024-09-06 DIAGNOSIS — E785 Hyperlipidemia, unspecified: Secondary | ICD-10-CM | POA: Diagnosis not present

## 2024-09-06 DIAGNOSIS — I10 Essential (primary) hypertension: Secondary | ICD-10-CM | POA: Diagnosis not present

## 2024-09-06 DIAGNOSIS — Z7982 Long term (current) use of aspirin: Secondary | ICD-10-CM | POA: Diagnosis not present

## 2024-09-06 DIAGNOSIS — M109 Gout, unspecified: Secondary | ICD-10-CM | POA: Diagnosis not present

## 2024-09-06 DIAGNOSIS — F17200 Nicotine dependence, unspecified, uncomplicated: Secondary | ICD-10-CM | POA: Diagnosis present

## 2024-09-06 DIAGNOSIS — F418 Other specified anxiety disorders: Secondary | ICD-10-CM | POA: Diagnosis present

## 2024-09-06 DIAGNOSIS — I2510 Atherosclerotic heart disease of native coronary artery without angina pectoris: Secondary | ICD-10-CM | POA: Diagnosis not present

## 2024-09-06 DIAGNOSIS — R2241 Localized swelling, mass and lump, right lower limb: Secondary | ICD-10-CM | POA: Diagnosis present

## 2024-09-06 DIAGNOSIS — G8929 Other chronic pain: Secondary | ICD-10-CM | POA: Diagnosis present

## 2024-09-06 DIAGNOSIS — Z8673 Personal history of transient ischemic attack (TIA), and cerebral infarction without residual deficits: Secondary | ICD-10-CM | POA: Diagnosis not present

## 2024-09-06 DIAGNOSIS — F419 Anxiety disorder, unspecified: Secondary | ICD-10-CM | POA: Diagnosis not present

## 2024-09-06 DIAGNOSIS — N400 Enlarged prostate without lower urinary tract symptoms: Secondary | ICD-10-CM | POA: Diagnosis not present

## 2024-09-06 DIAGNOSIS — J449 Chronic obstructive pulmonary disease, unspecified: Secondary | ICD-10-CM | POA: Diagnosis not present

## 2024-09-06 DIAGNOSIS — I639 Cerebral infarction, unspecified: Secondary | ICD-10-CM | POA: Diagnosis present

## 2024-09-06 DIAGNOSIS — R55 Syncope and collapse: Principal | ICD-10-CM

## 2024-09-06 LAB — COMPREHENSIVE METABOLIC PANEL WITH GFR
ALT: 9 U/L (ref 0–44)
AST: 20 U/L (ref 15–41)
Albumin: 4.2 g/dL (ref 3.5–5.0)
Alkaline Phosphatase: 80 U/L (ref 38–126)
Anion gap: 11 (ref 5–15)
BUN: 22 mg/dL (ref 8–23)
CO2: 32 mmol/L (ref 22–32)
Calcium: 9.3 mg/dL (ref 8.9–10.3)
Chloride: 88 mmol/L — ABNORMAL LOW (ref 98–111)
Creatinine, Ser: 1.73 mg/dL — ABNORMAL HIGH (ref 0.61–1.24)
GFR, Estimated: 42 mL/min — ABNORMAL LOW (ref >60)
Glucose, Bld: 112 mg/dL — ABNORMAL HIGH (ref 70–99)
Potassium: 3.4 mmol/L — ABNORMAL LOW (ref 3.5–5.1)
Sodium: 131 mmol/L — ABNORMAL LOW (ref 135–145)
Total Bilirubin: 0.5 mg/dL (ref 0.0–1.2)
Total Protein: 8.4 g/dL — ABNORMAL HIGH (ref 6.5–8.1)

## 2024-09-06 LAB — CBC
HCT: 34.3 % — ABNORMAL LOW (ref 39.0–52.0)
Hemoglobin: 11 g/dL — ABNORMAL LOW (ref 13.0–17.0)
MCH: 26.4 pg (ref 26.0–34.0)
MCHC: 32.1 g/dL (ref 30.0–36.0)
MCV: 82.5 fL (ref 80.0–100.0)
Platelets: 387 K/uL (ref 150–400)
RBC: 4.16 MIL/uL — ABNORMAL LOW (ref 4.22–5.81)
RDW: 14.2 % (ref 11.5–15.5)
WBC: 13.5 K/uL — ABNORMAL HIGH (ref 4.0–10.5)
nRBC: 0 % (ref 0.0–0.2)

## 2024-09-06 LAB — URINALYSIS, ROUTINE W REFLEX MICROSCOPIC
Bacteria, UA: NONE SEEN
Bilirubin Urine: NEGATIVE
Glucose, UA: NEGATIVE mg/dL
Ketones, ur: NEGATIVE mg/dL
Leukocytes,Ua: NEGATIVE
Nitrite: NEGATIVE
Protein, ur: NEGATIVE mg/dL
Specific Gravity, Urine: 1.009 (ref 1.005–1.030)
pH: 5 (ref 5.0–8.0)

## 2024-09-06 LAB — URIC ACID: Uric Acid, Serum: 11.2 mg/dL — ABNORMAL HIGH (ref 3.7–8.6)

## 2024-09-06 LAB — MAGNESIUM: Magnesium: 2 mg/dL (ref 1.7–2.4)

## 2024-09-06 LAB — C-REACTIVE PROTEIN: CRP: 2.5 mg/dL — ABNORMAL HIGH (ref <1.0)

## 2024-09-06 LAB — PHOSPHORUS: Phosphorus: 3.3 mg/dL (ref 2.5–4.6)

## 2024-09-06 LAB — SEDIMENTATION RATE: Sed Rate: 67 mm/h — ABNORMAL HIGH (ref 0–20)

## 2024-09-06 MED ORDER — INFLUENZA VAC SPLIT HIGH-DOSE 0.5 ML IM SUSY
0.5000 mL | PREFILLED_SYRINGE | INTRAMUSCULAR | Status: DC
  Filled 2024-09-06: qty 0.5

## 2024-09-06 MED ORDER — COLCHICINE 0.6 MG PO TABS
1.2000 mg | ORAL_TABLET | Freq: Once | ORAL | Status: AC
  Administered 2024-09-06: 1.2 mg via ORAL
  Filled 2024-09-06: qty 2

## 2024-09-06 MED ORDER — MORPHINE SULFATE (PF) 4 MG/ML IV SOLN
4.0000 mg | Freq: Once | INTRAVENOUS | Status: AC
  Administered 2024-09-06: 4 mg via INTRAVENOUS
  Filled 2024-09-06: qty 1

## 2024-09-06 MED ORDER — ACETAMINOPHEN 650 MG RE SUPP: 650.0000 mg | Freq: Four times a day (QID) | RECTAL | Status: DC | PRN

## 2024-09-06 MED ORDER — PANTOPRAZOLE SODIUM 40 MG PO TBEC
40.0000 mg | DELAYED_RELEASE_TABLET | Freq: Two times a day (BID) | ORAL | Status: AC
Start: 2024-09-06 — End: ?
  Administered 2024-09-06 – 2024-09-07 (×2): 40 mg via ORAL
  Filled 2024-09-06 (×2): qty 1

## 2024-09-06 MED ORDER — SODIUM CHLORIDE 0.9 % IV BOLUS
1000.0000 mL | Freq: Once | INTRAVENOUS | Status: AC
Start: 2024-09-06 — End: 2024-09-06
  Administered 2024-09-06: 1000 mL via INTRAVENOUS

## 2024-09-06 MED ORDER — MONTELUKAST SODIUM 10 MG PO TABS
10.0000 mg | ORAL_TABLET | Freq: Every day | ORAL | Status: DC
  Administered 2024-09-07: 10 mg via ORAL
  Filled 2024-09-06: qty 1

## 2024-09-06 MED ORDER — ONDANSETRON HCL 4 MG PO TABS: 4.0000 mg | ORAL_TABLET | Freq: Four times a day (QID) | ORAL | Status: DC | PRN

## 2024-09-06 MED ORDER — LACTATED RINGERS IV SOLN: INTRAVENOUS | Status: DC

## 2024-09-06 MED ORDER — COLCHICINE 0.3 MG HALF TABLET
0.3000 mg | ORAL_TABLET | Freq: Once | ORAL | Status: AC
  Administered 2024-09-06: 0.3 mg via ORAL
  Filled 2024-09-06 (×2): qty 1

## 2024-09-06 MED ORDER — OXYCODONE HCL 5 MG PO TABS
15.0000 mg | ORAL_TABLET | ORAL | Status: DC | PRN
  Administered 2024-09-07: 15 mg via ORAL
  Filled 2024-09-06 (×2): qty 3

## 2024-09-06 MED ORDER — ORAL CARE MOUTH RINSE: 15.0000 mL | OROMUCOSAL | Status: DC | PRN

## 2024-09-06 MED ORDER — ROPINIROLE HCL 1 MG PO TABS
1.0000 mg | ORAL_TABLET | Freq: Every day | ORAL | Status: AC
Start: 2024-09-06 — End: ?
  Administered 2024-09-06: 1 mg via ORAL
  Filled 2024-09-06: qty 1

## 2024-09-06 MED ORDER — ADULT MULTIVITAMIN W/MINERALS CH
1.0000 | ORAL_TABLET | Freq: Every day | ORAL | Status: DC
  Administered 2024-09-07: 1 via ORAL
  Filled 2024-09-06: qty 1

## 2024-09-06 MED ORDER — ATORVASTATIN CALCIUM 20 MG PO TABS
80.0000 mg | ORAL_TABLET | Freq: Every day | ORAL | Status: AC
Start: 2024-09-06 — End: ?
  Administered 2024-09-06: 80 mg via ORAL
  Filled 2024-09-06: qty 4

## 2024-09-06 MED ORDER — ONDANSETRON HCL 4 MG/2ML IJ SOLN
4.0000 mg | Freq: Four times a day (QID) | INTRAMUSCULAR | Status: DC | PRN
  Administered 2024-09-07: 4 mg via INTRAVENOUS
  Filled 2024-09-06: qty 2

## 2024-09-06 MED ORDER — FLUTICASONE FUROATE-VILANTEROL 200-25 MCG/ACT IN AEPB
1.0000 | INHALATION_SPRAY | Freq: Every day | RESPIRATORY_TRACT | Status: DC
  Filled 2024-09-06: qty 28

## 2024-09-06 MED ORDER — SENNOSIDES-DOCUSATE SODIUM 8.6-50 MG PO TABS: 1.0000 | ORAL_TABLET | Freq: Every evening | ORAL | Status: DC | PRN

## 2024-09-06 MED ORDER — FENTANYL CITRATE (PF) 50 MCG/ML IJ SOSY
25.0000 ug | PREFILLED_SYRINGE | INTRAMUSCULAR | Status: AC | PRN
  Administered 2024-09-06: 25 ug via INTRAVENOUS
  Filled 2024-09-06: qty 1

## 2024-09-06 MED ORDER — LIDOCAINE-EPINEPHRINE-TETRACAINE (LET) TOPICAL GEL
3.0000 mL | Freq: Once | TOPICAL | Status: AC
  Administered 2024-09-06: 3 mL via TOPICAL
  Filled 2024-09-06: qty 3

## 2024-09-06 MED ORDER — BUDESON-GLYCOPYRROL-FORMOTEROL 160-9-4.8 MCG/ACT IN AERO
2.0000 | INHALATION_SPRAY | Freq: Two times a day (BID) | RESPIRATORY_TRACT | Status: DC
  Administered 2024-09-06 – 2024-09-07 (×2): 2 via RESPIRATORY_TRACT
  Filled 2024-09-06: qty 5.9

## 2024-09-06 MED ORDER — LINACLOTIDE 145 MCG PO CAPS
290.0000 ug | ORAL_CAPSULE | Freq: Every day | ORAL | Status: DC
  Administered 2024-09-07: 290 ug via ORAL
  Filled 2024-09-06: qty 2

## 2024-09-06 MED ORDER — MORPHINE SULFATE (PF) 4 MG/ML IV SOLN
4.0000 mg | INTRAVENOUS | Status: AC | PRN
  Administered 2024-09-06: 4 mg via INTRAVENOUS
  Filled 2024-09-06: qty 1

## 2024-09-06 MED ORDER — CLOPIDOGREL BISULFATE 75 MG PO TABS
75.0000 mg | ORAL_TABLET | Freq: Every day | ORAL | Status: DC
  Administered 2024-09-07: 75 mg via ORAL
  Filled 2024-09-06: qty 1

## 2024-09-06 MED ORDER — ACETAMINOPHEN 325 MG PO TABS: 650.0000 mg | ORAL_TABLET | Freq: Four times a day (QID) | ORAL | Status: DC | PRN

## 2024-09-06 MED ORDER — FLUOXETINE HCL 20 MG PO CAPS
40.0000 mg | ORAL_CAPSULE | Freq: Every day | ORAL | Status: DC
  Administered 2024-09-07: 40 mg via ORAL
  Filled 2024-09-06: qty 2

## 2024-09-06 MED ORDER — POLYETHYLENE GLYCOL 3350 17 G PO PACK: 17.0000 g | PACK | Freq: Every day | ORAL | Status: DC | PRN

## 2024-09-06 MED ORDER — IPRATROPIUM-ALBUTEROL 0.5-2.5 (3) MG/3ML IN SOLN: 3.0000 mL | Freq: Four times a day (QID) | RESPIRATORY_TRACT | Status: DC | PRN

## 2024-09-06 MED ORDER — TAMSULOSIN HCL 0.4 MG PO CAPS
0.4000 mg | ORAL_CAPSULE | Freq: Every day | ORAL | Status: DC
  Administered 2024-09-07: 0.4 mg via ORAL
  Filled 2024-09-06: qty 1

## 2024-09-06 MED ORDER — MELATONIN 5 MG PO TABS
5.0000 mg | ORAL_TABLET | Freq: Every evening | ORAL | Status: DC | PRN
  Administered 2024-09-06: 5 mg via ORAL
  Filled 2024-09-06: qty 1

## 2024-09-06 MED ORDER — ISOSORBIDE MONONITRATE ER 30 MG PO TB24
30.0000 mg | ORAL_TABLET | Freq: Every day | ORAL | Status: DC
  Administered 2024-09-07: 30 mg via ORAL
  Filled 2024-09-06: qty 1

## 2024-09-06 MED ORDER — OXYCODONE HCL 5 MG PO TABS
15.0000 mg | ORAL_TABLET | Freq: Four times a day (QID) | ORAL | Status: DC
  Administered 2024-09-06 – 2024-09-07 (×3): 15 mg via ORAL
  Filled 2024-09-06 (×2): qty 3

## 2024-09-06 MED ORDER — POTASSIUM CHLORIDE CRYS ER 20 MEQ PO TBCR
20.0000 meq | EXTENDED_RELEASE_TABLET | Freq: Once | ORAL | Status: AC
  Administered 2024-09-06: 20 meq via ORAL
  Filled 2024-09-06: qty 1

## 2024-09-06 MED ORDER — NICOTINE 21 MG/24HR TD PT24: 21.0000 mg | MEDICATED_PATCH | Freq: Every day | TRANSDERMAL | Status: DC | PRN

## 2024-09-06 MED ORDER — FUROSEMIDE 20 MG PO TABS
20.0000 mg | ORAL_TABLET | Freq: Every day | ORAL | Status: DC
  Administered 2024-09-07: 20 mg via ORAL
  Filled 2024-09-06: qty 1

## 2024-09-06 MED ORDER — FAMOTIDINE 20 MG PO TABS
40.0000 mg | ORAL_TABLET | Freq: Every day | ORAL | Status: DC
  Administered 2024-09-07: 40 mg via ORAL
  Filled 2024-09-06: qty 2

## 2024-09-06 MED ORDER — ASPIRIN 81 MG PO TBEC
81.0000 mg | DELAYED_RELEASE_TABLET | Freq: Every day | ORAL | Status: DC
  Administered 2024-09-06 – 2024-09-07 (×2): 81 mg via ORAL
  Filled 2024-09-06 (×2): qty 1

## 2024-09-06 MED ORDER — HEPARIN SODIUM (PORCINE) 5000 UNIT/ML IJ SOLN
5000.0000 [IU] | Freq: Three times a day (TID) | INTRAMUSCULAR | Status: DC
  Administered 2024-09-06 – 2024-09-07 (×2): 5000 [IU] via SUBCUTANEOUS
  Filled 2024-09-06 (×2): qty 1

## 2024-09-06 MED ORDER — HYDRALAZINE HCL 20 MG/ML IJ SOLN: 5.0000 mg | Freq: Four times a day (QID) | INTRAMUSCULAR | Status: DC | PRN

## 2024-09-06 MED ORDER — MORPHINE SULFATE (PF) 2 MG/ML IV SOLN
2.0000 mg | INTRAVENOUS | Status: AC | PRN
  Administered 2024-09-06: 2 mg via INTRAVENOUS
  Filled 2024-09-06: qty 1

## 2024-09-06 MED ORDER — HYDROMORPHONE HCL 1 MG/ML IJ SOLN
0.5000 mg | Freq: Once | INTRAMUSCULAR | Status: AC
  Administered 2024-09-06: 0.5 mg via INTRAVENOUS
  Filled 2024-09-06: qty 0.5

## 2024-09-06 MED ORDER — SPIRONOLACTONE 25 MG PO TABS
25.0000 mg | ORAL_TABLET | Freq: Every day | ORAL | Status: DC
  Administered 2024-09-07: 25 mg via ORAL
  Filled 2024-09-06: qty 1

## 2024-09-06 MED ORDER — AMLODIPINE BESYLATE 5 MG PO TABS
5.0000 mg | ORAL_TABLET | Freq: Every day | ORAL | Status: DC
  Administered 2024-09-07: 5 mg via ORAL
  Filled 2024-09-06: qty 1

## 2024-09-06 MED ORDER — HYDROMORPHONE HCL 1 MG/ML IJ SOLN
2.0000 mg | Freq: Once | INTRAMUSCULAR | Status: AC
  Administered 2024-09-06: 2 mg via INTRAVENOUS
  Filled 2024-09-06: qty 2

## 2024-09-06 MED ORDER — NITROGLYCERIN 0.4 MG SL SUBL: 0.4000 mg | SUBLINGUAL_TABLET | SUBLINGUAL | Status: DC | PRN

## 2024-09-06 NOTE — Assessment & Plan Note (Addendum)
 PPI and home famotidine 40 mg daily resumed

## 2024-09-06 NOTE — Assessment & Plan Note (Signed)
 As needed nicotine  patch for nicotine  craving

## 2024-09-06 NOTE — Assessment & Plan Note (Signed)
 PDMP reviewed Current active prescription includes oxycodone  50 mg tablet, 180 tablets, 30-day supply filled on 09/01/24

## 2024-09-06 NOTE — Assessment & Plan Note (Signed)
 Home aspirin  81 mg daily, atorvastatin  80 mg daily were resumed on admission

## 2024-09-06 NOTE — Assessment & Plan Note (Signed)
 Suspect reactive in setting of acute gout flare Treat per above Low clinical suspicion for infectious etiology at this time therefore antibiotic is not indicated

## 2024-09-06 NOTE — H&P (Signed)
 History and Physical   Bradley Sawyer FMW:[id number] DOB: 09-30-1956 DOA: 09/06/2024  PCP: Bradley Sawyer LABOR, MD  Patient coming from: Home  I have personally briefly reviewed patient's old medical records in [facility] EMR.  Chief Concern: Right foot swelling and pain  HPI: [name] is a 68 year old male with history of COPD, hypertension, history of CVA, who presents emergency department for chief concerns of feeling lightheadedness with right foot swelling and pain.  Vitals in the ED showed t 98.3, rr 16, hr 142 initially and then improved to 88, blood pressure 104/66, SpO2 of 95% on 4 L nasal cannula.  Serum sodium is 131, potassium 3.4, chloride 88, bicarb 32, BUN of 22, serum creatinine 1.73, eGFR 42, nonfasting blood glucose 112, WBC 13.5, hemoglobin 11, platelet 387.  UA was negative for leukocytes and nitrates.  Right foot x-rays pending.  Uric acid was elevated at 11.2.  Sed rate is 67.  C-reactive protein is in process.  Phosphorus and magnesium  level were within normal limits.  ED treatment: Dilaudid  0.5 mg IV one-time dose, morphine  4 mg IV one-time dose, sodium chloride  1 L bolus.  EDP discussed with podiatry who does agree that it is likely gout and not cellulitis.  Initially podiatry recommended MRI of the foot however given uric acid is markedly elevated, recommends change to treating with gout only at this time. -------------------------------------- At bedside, patient is been having right-sided foot joint pain for about 2 months.  Denies known trauma.  She denies fever, chills, nausea, vomiting, chest, shortness of breath.  Reports the pain is so severe, he has not eaten or drink a lot of water.  He drinks a lot of soda.  He reports he does not have history of gout.  Social history: She lives at home  ROS: Constitutional: no weight change, no fever ENT/Mouth: no sore throat, no rhinorrhea Eyes: no eye pain, no vision changes Cardiovascular: no chest  pain, no dyspnea,  no edema, no palpitations Respiratory: no cough, no sputum, no wheezing Gastrointestinal: no nausea, no vomiting, no diarrhea, no constipation Genitourinary: no urinary incontinence, no dysuria, no hematuria Musculoskeletal: + arthralgias, no myalgias Skin: no skin lesions, no pruritus, Neuro: + weakness, no loss of consciousness, no syncope Psych: no anxiety, no depression, + decrease appetite Heme/Lymph: no bruising, no bleeding  ED Course: Discussed with EDP, patient requiring hospitalization for chief concerns of acute kidney injury and acute gout.  Assessment/Plan  Principal Problem:   AKI (acute kidney injury) Active Problems:   Gout attack   Essential hypertension   COPD (chronic obstructive pulmonary disease) (HCC)   CAD (coronary artery disease)   Hyperlipemia   GERD   Chronic pain, radicular   Leucocytosis   BPH (benign prostatic hyperplasia)   Depression with anxiety   Chronic pain disorder   PAD (peripheral artery disease)   TOBACCO ABUSE   Cerebral infarction (HCC)   Assessment and Plan:  * AKI (acute kidney injury) With hypokalemia Suspect secondary to poor p.o. intake in setting of pain Status post sodium chloride  1 L bolus per EDP Continue with LR infusion at 125 mL/h, 1 day ordered Potassium chloride  20 mill colons p.o. one-time dose ordered  Gout attack With renal impairment High dose colchicine  1.2 mg p.o. one-time dose ordered followed by colchicine  0.3 mg p.o. one-time dose 1 hour later       CAD (coronary artery disease) Home aspirin  81 mg daily, atorvastatin  80 mg daily were resumed on admission  COPD (  chronic obstructive pulmonary disease) (HCC) Not in acute exacerbation DuoNebs every 6 hours as needed for wheezing and shortness of breath Home long-acting inhaler equivalent ordered on admission  Essential hypertension Home amlodipine  5 mg daily resumed Hydralazine  5 mg IV every 6 hours as needed for SBP greater  165, 5 days ordered  Depression with anxiety Home fluoxetine  40 mg daily resumed  BPH (benign prostatic hyperplasia) Home tamsulosin  0.4 mg daily resumed  Leucocytosis Suspect reactive in setting of acute gout flare Treat per above Low clinical suspicion for infectious etiology at this time therefore antibiotic is not indicated  Chronic pain, radicular PDMP reviewed Current active prescription includes oxycodone  50 mg tablet, 180 tablets, 30-day supply filled on 09/01/24  GERD PPI and home famotidine 40 mg daily resumed  Hyperlipemia Atorvastatin  80 mg nightly  TOBACCO ABUSE As needed nicotine  patch for nicotine  craving  Chart reviewed.   DVT prophylaxis: 5000 units subcutaneous q. 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   Compression fracture    L2   COPD (chronic obstructive pulmonary disease) (HCC)    bronchitis   CVA (cerebral infarction)    Disorder of vocal cord    Dyspnea    Fatty liver    GERD (gastroesophageal reflux disease)    Hx of cardiovascular stress test    ETT/Lexiscan -Myoview (10/15):  Inferior, inferoapical, apical lateral defect c/w mild ischemia, EF 66%; Intermediate Risk   Hyperlipidemia    Hypertension    Right shoulder pain    2/2 partial rotator cuff tear, tendinopathy, mild subacromial/subdeltioid bursitis, A/C joint arthropathy per MRI done in 3/07   Stroke (HCC) 2014   Tobacco abuse    Past Surgical History:  Procedure Laterality Date   ANTERIOR LAT LUMBAR FUSION N/A 12/18/2013   Procedure: Lumbar Two Anteriorlateral  Corpectomy w/ cage, interbody fusion, anterior plating, Lumbar One to Lumbar Three percutaneous pedicle screws;  Surgeon: Victory Bradley Gunnels, MD;  Location: [facility]UR[facility]S;  Service: Neurosurgery;  Laterality: N/A;   AORTA -INNOMIATE BYPASS N/A 10/15/2014   Procedure: ENDARTERCTOMY OF  INNOMINATE ARTERY ;  Surgeon: Carlin FORBES Haddock, MD;  Location: [facility] OR;  Service: Vascular;  Laterality: N/A;   ARCH AORTOGRAM N/A 08/23/2014   Procedure: ARCH AORTOGRAM;  Surgeon: Carlin FORBES Haddock, MD;  Location: [facility] CATH LAB;  Service: Cardiovascular;  Laterality: N/A;   arch aortogram, left carotid,left subclavian angiogram  08/23/2014   BIOPSY  09/14/2023   Procedure: BIOPSY;  Surgeon: Jinny Carmine, MD;  Location: [facility] ENDOSCOPY;  Service: Endoscopy;;   CAROTID ANGIOGRAM Left 08/23/2014   Procedure: CAROTID GERALYN;  Surgeon: Carlin FORBES Haddock, MD;  Location: [facility] CATH LAB;  Service: Cardiovascular;  Laterality: Left;   CHOLECYSTECTOMY  12/2016   COLONOSCOPY WITH PROPOFOL  N/A 09/14/2023   Procedure: COLONOSCOPY WITH PROPOFOL ;  Surgeon: Jinny Carmine, MD;  Location: [facility] ENDOSCOPY;  Service: Endoscopy;  Laterality: N/A;   ENDARTERECTOMY Right 10/15/2014   Procedure: ENDARTERECTOMY RIGHT SUBCLAVIAN ARTERY;  Surgeon: Carlin FORBES Haddock, MD;  Location: [facility] OR;  Service: Vascular;  Laterality: Right;   ENDARTERECTOMY Right 10/15/2014   Procedure: ENDARTERECTOMY RIGHT COMMON CAROTID ARTERY;  Surgeon: Carlin FORBES  Fields, MD;  Location: [facility] OR;  Service: Vascular;  Laterality: Right;   ESOPHAGOGASTRODUODENOSCOPY (EGD) WITH PROPOFOL  N/A 09/14/2023   Procedure: ESOPHAGOGASTRODUODENOSCOPY (EGD) WITH PROPOFOL ;  Surgeon: Jinny Carmine, MD;  Location: [facility] ENDOSCOPY;  Service: Endoscopy;  Laterality: N/A;   ESOPHAGOGASTRODUODENOSCOPY (EGD) WITH PROPOFOL  N/A 11/24/2023   Procedure: ESOPHAGOGASTRODUODENOSCOPY (EGD) WITH PROPOFOL ;  Surgeon: Jinny Carmine, MD;  Location: [facility] ENDOSCOPY;  Service: Endoscopy;  Laterality: N/A;   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   HERNIA REPAIR     LEFT HEART CATH AND CORONARY ANGIOGRAPHY N/A 03/17/2021   Procedure: LEFT HEART CATH AND CORONARY ANGIOGRAPHY;  Surgeon: Fernand Denyse LABOR, MD;  Location: [facility] INVASIVE CV LAB;  Service: Cardiovascular;  Laterality: N/A;   LEFT HEART CATHETERIZATION WITH CORONARY ANGIOGRAM N/A 09/12/2014   Procedure: LEFT HEART CATHETERIZATION WITH CORONARY ANGIOGRAM;  Surgeon: Maude JAYSON Emmer, MD;  Location: [facility] CATH LAB;  Service: Cardiovascular;  Laterality: N/A;   LUMBAR SPINE SURGERY  12/18/2013   L2     DR POOL   NECK SURGERY     PATCH ANGIOPLASTY Right 10/15/2014   Procedure: PATCH ANGIOPLASTY OF RIGHT SUBCLAVIAN ARTERY , RIGHT COMMON CAROTID ARTERY & INNOMINATE ARTERY;  Surgeon: Carlin FORBES Haddock, MD;  Location[facility] OR;  Service: Vascular;  Laterality: Right;   STERNOTOMY N/A 10/15/2014   Procedure: PARTIAL STERNOTOMY & PLATING OF STERNUM;  Surgeon: Carlin FORBES Haddock, MD;  Location: [facility] OR;  Service: Vascular;  Laterality: N/A;   TEE WITHOUT CARDIOVERSION N/A 12/25/2013   Procedure: TRANSESOPHAGEAL ECHOCARDIOGRAM (TEE);  Surgeon: Ezra GORMAN Shuck, MD;  Location: [facility] ENDOSCOPY;  Service: Cardiovascular;  Laterality: N/A;   Social History:  reports that he quit smoking about 3 years ago. His smoking use included cigarettes. He started smoking about 51 years ago. He has a 96 pack-year smoking history. He has never used smokeless tobacco. He reports that he does not drink alcohol and does not use drugs.  Allergies  Allergen Reactions   Bee Venom Swelling   Tapentadol Shortness Of Breath    Other Reaction(s): sob/tachycardia   Family History  Problem Relation Age of Onset   Heart disease Father    Heart attack Father    Diabetes Paternal Grandmother        1 st degree relatives   Hypertension Paternal Grandmother    Diabetes Brother    Heart disease Brother    Muscular dystrophy Brother    Muscular dystrophy Sister    Prostate cancer Neg Hx    Kidney cancer  Neg Hx    Bladder Cancer Neg Hx    Family history: Family history reviewed and not pertinent.  Prior to Admission medications   Medication Sig Start Date End Date Taking? Authorizing Provider  cyclobenzaprine  (FLEXERIL ) 10 MG tablet Take 10 mg by mouth 2 (two) times daily as needed. 09/03/24  Yes [provider]  albuterol  (VENTOLIN  HFA) 108 (90 Base) MCG/ACT inhaler INHALE 2 PUFFS BY MOUTH EVERY 4-6 HOURS AS NEEDED FOR WHEEZING 02/25/23   Tamea Dedra CROME, MD  amLODipine  (NORVASC ) 5 MG tablet Take 5 mg by mouth daily. 06/02/24   [provider]  aspirin  EC 81 MG tablet Take 81 mg by mouth daily.    [provider]  atorvastatin  (LIPITOR ) 80 MG tablet TAKE ONE TABLET BY MOUTH ONCE DAILY AT BEDTIME 01/03/24   Fernand Denyse LABOR, MD  BREZTRI  AEROSPHERE 160-9-4.8 MCG/ACT AERO inhaler INHALE TWO PUFFS into THE lungs TWICE DAILY morning & bedtime 06/01/24  Tamea Dedra CROME, MD  clopidogrel  (PLAVIX ) 75 MG tablet TAKE ONE TABLET BY MOUTH ONCE DAILY 01/03/24   Fernand Alter A, MD  EPINEPHrine 0.3 mg/0.3 mL IJ SOAJ injection Inject 0.3 mg into the muscle as needed for anaphylaxis. 04/04/20   [provider]  famotidine (PEPCID) 40 MG tablet TAKE ONE TABLET BY MOUTH ONCE DAILY 01/03/24   Fernand Alter LABOR, MD  Fe Fum-Vit C-Vit B12-FA (TRIGELS-F FORTE) CAPS capsule Take 1 capsule by mouth daily after breakfast. 08/08/24 11/06/24  Von Bellis, MD  FLUoxetine  (PROZAC ) 20 MG capsule Take 20 mg by mouth as needed. 06/01/21   [provider]  FLUoxetine  (PROZAC ) 40 MG capsule Take 40 mg by mouth daily. 09/15/23   [provider]  fluticasone -salmeterol (ADVAIR) 250-50 MCG/ACT AEPB Inhale 1 puff into the lungs 2 (two) times daily. 06/14/24   [provider]  furosemide  (LASIX ) 20 MG tablet TAKE ONE TABLET BY MOUTH EVERY DAY 01/03/24   Fernand Alter A, MD  ipratropium-albuterol  (DUONEB) 0.5-2.5 (3) MG/3ML SOLN Take 3 mLs by nebulization every 6 (six) hours as  needed. 03/01/24   Tamea Dedra CROME, MD  isosorbide  mononitrate (IMDUR ) 30 MG 24 hr tablet Take 30 mg by mouth daily.    [provider]  lansoprazole  (PREVACID ) 30 MG capsule Take 30 mg by mouth.    [provider]  levocetirizine (XYZAL ) 5 MG tablet Take 5 mg by mouth at bedtime. 07/15/20   [provider]  linaclotide  (LINZESS ) 290 MCG CAPS capsule Take 290 mcg by mouth daily before breakfast. 12/05/19   [provider]  lisinopril -hydrochlorothiazide  (ZESTORETIC ) 20-25 MG tablet TAKE ONE TABLET BY MOUTH ONCE DAILY 01/03/24   Fernand Alter LABOR, MD  metFORMIN  (GLUCOPHAGE ) 500 MG tablet Take 500 mg by mouth daily. 07/22/20   [provider]  montelukast  (SINGULAIR ) 10 MG tablet Take 10 mg by mouth daily. 07/15/20   [provider]  Multiple Vitamin (MULTI-VITAMINS) TABS Take 1 tablet by mouth daily.    [provider]  naloxone  (NARCAN ) nasal spray 4 mg/0.1 mL Place 0.4 mg into the nose once. 09/02/16   [provider]  nitroGLYCERIN  (NITROSTAT ) 0.4 MG SL tablet DISSOLVE (1) TABLET UNDER TONGUE AS NEEDED TO RELIEVE CHEST PAIN. MAY REPEAT EVERY 5 MINUTES. 05/06/23   Fernand Alter LABOR, MD  ondansetron  (ZOFRAN ) 4 MG tablet Take by mouth. 02/04/22   [provider]  oxyCODONE  (ROXICODONE ) 15 MG immediate release tablet Take 15 mg by mouth every 4 (four) hours. 10/09/21   [provider]  OXYGEN Inhale 3 L into the lungs.    [provider]  pantoprazole  (PROTONIX ) 40 MG tablet TAKE ONE TABLET BY MOUTH TWICE DAILY BEFORE MEALS 06/18/24   Jinny Carmine, MD  polyethylene glycol (MIRALAX  / GLYCOLAX ) packet Take 17 g by mouth daily as needed (for constipation).    [provider]  pregabalin  (LYRICA ) 150 MG capsule Take 150 mg by mouth daily. 09/20/19   [provider]  rOPINIRole  (REQUIP ) 1 MG tablet Take 1 tablet by mouth at bedtime. 06/01/21   [provider]  spironolactone (ALDACTONE) 25 MG  tablet TAKE ONE TABLET BY MOUTH ONCE DAILY 01/03/24   Fernand Alter LABOR, MD  tamsulosin  (FLOMAX ) 0.4 MG CAPS capsule TAKE ONE CAPSULE BY MOUTH EVERY DAY 03/07/23   Helon Clotilda LABOR, PA-C    Physical Exam: Vitals:   09/06/24 1022 09/06/24 1047 09/06/24 1257 09/06/24 1428  BP:  105/68  111/76  Pulse:  92  87  Resp:  16  18  Temp:   98 F (36.7 C) (!) 97.5 F (36.4 C)  TempSrc:   Oral Oral  SpO2: 95% 94%  97%  Weight:       Constitutional: appears age appropriate, frail, mildly anxious Eyes: PERRL, lids and conjunctivae normal ENMT: Mucous membranes are moist. Posterior pharynx clear of any exudate or lesions. Age-appropriate dentition. Hearing appropriate Neck: normal, supple, no masses, no thyromegaly Respiratory: clear to auscultation bilaterally, no wheezing, no crackles. Normal respiratory effort. No accessory muscle use.  Cardiovascular: Regular rate and rhythm, no murmurs / rubs / gallops. No extremity edema. 2+ pedal pulses. No carotid bruits.  Abdomen: no tenderness, no masses palpated, no hepatosplenomegaly. Bowel sounds positive.  Musculoskeletal: no clubbing / cyanosis. No joint deformity upper and lower extremities. Good ROM, no contractures, no atrophy. Normal muscle tone.  Skin: + right first metatarsophalangeal joint redness and tenderness     Neurologic: Sensation intact. Strength 5/5 in all 4.  Psychiatric: Normal judgment and insight. Alert and oriented x 3. Normal mood.   EKG: independently reviewed, showing sinus tachycardia with rate of 130, QTc 476  Chest x-ray on Admission: Not indicated at this time  DG Foot Complete Right Result Date: 09/06/2024 EXAM: 3 OR MORE VIEW(S) XRAY OF THE RIGHT FOOT 09/06/2024 01:28:00 PM COMPARISON: Comparison to right foot radiographs 08/05/2024 09:14. CLINICAL HISTORY: Great toe pain and swelling. FINDINGS: BONES AND JOINTS: There is a new intra-articular nondisplaced fracture of the medial base of the proximal phalanx of the  great toe. Severe hallux valgus deformity, increased compared to the prior exam. No joint dislocation. Mild first MTP (metatarsophalangeal) joint osteoarthritis. Calcaneal enthesopathy at the insertion of the achilles tendon and the origin of the central cord of the plantar fascia. SOFT TISSUES: Soft tissue swelling at the first MTP joint. IMPRESSION: 1. New intra-articular nondisplaced fracture of the medial base of the proximal phalanx of the right great toe. 2. Severe hallux valgus deformity is increased compared to the prior exam . 3. Mild first MTP joint osteoarthritis. 4. Calcaneal enthesopathy. Electronically signed by: Harrietta Sherry MD 09/06/2024 02:08 PM EDT RP Workstation: [id number]   CT Head Wo Contrast Result Date: 09/06/2024 CLINICAL DATA:  Status post fall. EXAM: CT HEAD WITHOUT CONTRAST TECHNIQUE: Contiguous axial images were obtained from the base of the skull through the vertex without intravenous contrast. RADIATION DOSE REDUCTION: This exam was performed according to the departmental dose-optimization program which includes automated exposure control, adjustment of the mA and/or kV according to patient size and/or use of iterative reconstruction technique. COMPARISON:  December 21, 2013 FINDINGS: Brain: There is generalized cerebral atrophy with widening of the extra-axial spaces and ventricular dilatation. There are areas of decreased attenuation within the white matter tracts of the supratentorial brain, consistent with microvascular disease changes. Vascular: Mild to moderate severity bilateral cavernous carotid artery calcification is noted. Skull: Normal. Negative for fracture or focal lesion. Sinuses/Orbits: No acute finding. Other: None. IMPRESSION: 1. No acute intracranial abnormality. 2. Generalized cerebral atrophy and microvascular disease changes of the supratentorial brain. Electronically Signed   By: Suzen Dials M.D.   On: 09/06/2024 12:34   CT Cervical Spine Wo  Contrast Result Date: 09/06/2024 CLINICAL DATA:  Fall yesterday due to lightheadedness.  Neck trauma. EXAM: CT CERVICAL SPINE WITHOUT CONTRAST TECHNIQUE: Multidetector CT imaging of the cervical spine was performed without intravenous contrast. Multiplanar CT image reconstructions were also generated. RADIATION DOSE REDUCTION: This exam was performed according to the departmental  dose-optimization program which includes automated exposure control, adjustment of the mA and/or kV according to patient size and/or use of iterative reconstruction technique. COMPARISON:  None recent. Cervical MRI 01/04/2019. Cervical spine radiographs 12/07/2018. FINDINGS: Alignment: Reversal of lordosis with chronic anterolisthesis at C2-3, similar to prior studies. Skull base and vertebrae: No evidence of acute fracture or traumatic subluxation. Remote C4-5 ACDF without hardware and solid interbody ankylosis. Interval C3-4 ACDF. Ankylosed left C3-4 facet joint. Soft tissues and spinal canal: No prevertebral fluid or swelling. No visible canal hematoma. Disc levels: Multilevel spondylosis. At C2-3, there is asymmetric facet hypertrophy and uncinate spurring on the left, contributing to moderate left foraminal narrowing. Mild residual right-sided foraminal narrowing at the fused C3-4 level. Chronic spondylosis at C5-6 and C6-7 contributing to chronic spinal stenosis and foraminal narrowing, greatest on the right at C5-6. Upper chest: Clear lung apices. Other: Bilateral carotid atherosclerosis. IMPRESSION: 1. No evidence of acute cervical spine fracture, traumatic subluxation or static signs of instability. 2. Interval C3-4 ACDF with solid interbody ankylosis. 3. Remote C4-5 ACDF with solid interbody ankylosis. 4. Chronic spondylosis at C5-6 and C6-7 with chronic spinal stenosis and foraminal narrowing, greatest on the right at C5-6. Electronically Signed   By: Elsie Perone M.D.   On: 09/06/2024 12:15   Labs on Admission: I have  personally reviewed following labs  CBC: Recent Labs  Lab 09/06/24 0919  WBC 13.5*  HGB 11.0*  HCT 34.3*  MCV 82.5  PLT 387   Basic Metabolic Panel: Recent Labs  Lab 09/06/24 0919  NA 131*  K 3.4*  CL 88*  CO2 32  GLUCOSE 112*  BUN 22  CREATININE 1.73*  CALCIUM  9.3  MG 2.0  PHOS 3.3   GFR: Estimated Creatinine Clearance: 43.5 mL/min (A) (by C-G formula based on SCr of 1.73 mg/dL (H)).  Liver Function Tests: Recent Labs  Lab 09/06/24 0919  AST 20  ALT 9  ALKPHOS 80  BILITOT 0.5  PROT 8.4*  ALBUMIN 4.2   Urine analysis:    Component Value Date/Time   COLORURINE YELLOW (A) 09/06/2024 1113   APPEARANCEUR CLEAR (A) 09/06/2024 1113   APPEARANCEUR Cloudy (A) 02/09/2022 1025   LABSPEC 1.009 09/06/2024 1113   LABSPEC 1.014 06/27/2013 2105   PHURINE 5.0 09/06/2024 1113   GLUCOSEU NEGATIVE 09/06/2024 1113   GLUCOSEU Negative 06/27/2013 2105   GLUCOSEU NEG mg/dL 96/87/7989 7891   HGBUR SMALL (A) 09/06/2024 1113   BILIRUBINUR NEGATIVE 09/06/2024 1113   BILIRUBINUR Negative 02/09/2022 1025   BILIRUBINUR Negative 06/27/2013 2105   KETONESUR NEGATIVE 09/06/2024 1113   PROTEINUR NEGATIVE 09/06/2024 1113   UROBILINOGEN 1.0 10/19/2014 1504   NITRITE NEGATIVE 09/06/2024 1113   LEUKOCYTESUR NEGATIVE 09/06/2024 1113   LEUKOCYTESUR Trace 06/27/2013 2105   This document was prepared using Dragon Voice Recognition software and may include unintentional dictation errors.  Dr. Sherre [facility]  If 7PM-7AM, please contact overnight-coverage provider If 7AM-7PM, please contact day attending provider [email or web address]  09/06/2024, 2:40 PM

## 2024-09-06 NOTE — ED Notes (Signed)
 Patient given urinal to provide urine sample when able to go.

## 2024-09-06 NOTE — Hospital Course (Addendum)
 [name] is a 68 year old male with history of COPD, hypertension, history of CVA, who presents emergency department for chief concerns of feeling lightheadedness with right foot swelling and pain.  Vitals in the ED showed t 98.3, rr 16, hr 142 initially and then improved to 88, blood pressure 104/66, SpO2 of 95% on 4 L nasal cannula.  Serum sodium is 131, potassium 3.4, chloride 88, bicarb 32, BUN of 22, serum creatinine 1.73, eGFR 42, nonfasting blood glucose 112, WBC 13.5, hemoglobin 11, platelet 387.  UA was negative for leukocytes and nitrates.  Right foot x-rays pending.  Uric acid was elevated at 11.2.  Sed rate is 67.  C-reactive protein is in process.  Phosphorus and magnesium  level were within normal limits.  ED treatment: Dilaudid  0.5 mg IV one-time dose, morphine  4 mg IV one-time dose, sodium chloride  1 L bolus.  EDP discussed with podiatry who does agree that it is likely gout and not cellulitis.  Initially podiatry recommended MRI of the foot however given uric acid is markedly elevated, recommends change to treating with gout only at this time.

## 2024-09-06 NOTE — Consult Note (Signed)
 PODIATRY CONSULTATION  NAME Bradley Sawyer MRN [id number] DOB Jun 21, 1956 DOA 09/06/2024   Reason for consult:  Chief Complaint  Patient presents with   Fall    Attending/Consulting physician: A. Cox DO  History of present illness: KOUROSH JABLONSKY is a 68 y.o. male with PMH of chronic hypoxic respiratory failure on 3 to 4 L oxygen, COPD, HTN, CVA, PAD, carotid stenosis, pre-DM, BPH, chronic L2 fracture  Coming in for right foot pain and possible fall yesterday hit his head. He was here a month ago for presumed gout flare, had improvement in his right foot with colchicine  and prednisone . Coming in today with worsening pain in right foot 1st MPJ pain and redness. Says he does not drink much water he mostly has soda, diet is variable.   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   Compression fracture    L2   COPD (chronic obstructive pulmonary disease) (HCC)    bronchitis   CVA (cerebral infarction)    Disorder of vocal cord    Dyspnea    Fatty liver    GERD (gastroesophageal reflux disease)    Hx of cardiovascular stress test    ETT/Lexiscan -Myoview (10/15):  Inferior, inferoapical, apical lateral defect c/w mild ischemia, EF 66%; Intermediate Risk   Hyperlipidemia    Hypertension    Right shoulder pain    2/2 partial rotator cuff tear, tendinopathy, mild subacromial/subdeltioid bursitis, A/C joint arthropathy per MRI done in 3/07   Stroke (HCC) 2014   Tobacco abuse        Latest Ref Rng & Units 09/06/2024    9:19 AM 08/07/2024    5:12 AM 08/06/2024    4:10 AM  CBC  WBC 4.0 - 10.5 K/uL 13.5  25.9  18.2   Hemoglobin 13.0 - 17.0 g/dL 88.9  8.4  8.9   Hematocrit 39.0 - 52.0 % 34.3  26.5  27.5   Platelets 150 - 400 K/uL 387  300  297        Latest Ref Rng & Units 09/06/2024    9:19 AM 08/07/2024    5:12 AM  08/06/2024    4:10 AM  BMP  Glucose 70 - 99 mg/dL 887  865  853   BUN 8 - 23 mg/dL 22  21  15    Creatinine 0.61 - 1.24 mg/dL 8.26  9.20  9.15   Sodium 135 - 145 mmol/L 131  134  136   Potassium 3.5 - 5.1 mmol/L 3.4  3.2  3.8   Chloride 98 - 111 mmol/L 88  95  97   CO2 22 - 32 mmol/L 32  30  29   Calcium  8.9 - 10.3 mg/dL 9.3  8.5  8.5       Physical Exam: Lower Extremity Exam  R foot DP and PT pulses 2+  Erythema and edema of the right 1st MPJ  Pain on palpation 1st MPJ with light touch  Able to move hallux slightly, HAV deformity, no open wounds      ASSESSMENT/PLAN OF CARE 68 y.o. male with PMHx significant for COPD on home oxygen HTN CVA with right foot first MPJ erythema and edema most likely acute gout flare versus septic first MPJ.  XR right foot: Possible cortical irregularity at the base of the proximal phalanx medial aspect,  though no gross erosion at the medial aspect first met head.  ESR 67 down from 108 1 mo ago, CRP pending WBC 13.5 Uric acid 11.2 up from 8 1 mo ago  - Discussed with patient and his daughter at bedside.  I recommended joint aspiration to rule in gout.  This will also help differentiate it from infection first MPJ.  They are agreeable.  After verbal consent was obtained I proceeded to use Betadine paint to cleanse the area.  Patient was given 2 mg Dilaudid  IV for pain relief.  He was also given a topical analgesic.  Then using a 22-gauge hypodermic needle I was able to aspirate scant sanguineous fluid off the first MPJ.  This was sent for culture and for cytology/crystal analysis separately. -Recommend we continue treatment for acute gout flare at this time with colchicine .  He has already gotten some of this in the ED. - Will defer antibiotic therapy to primary, okay to defer at this time given most likely scenario is gout attack at the area. - Anticoagulation: Continue per primary - Wound care: None required applied a bandage after the  aspiration - WB status: Weightbearing as tolerated in postop shoe will order - Will continue to follow   Thank you for the consult.  Please contact me directly with any questions or concerns.           Marolyn JULIANNA Honour, DPM [facility] / [facility]    [address] [place], [place] [address]                Office [phone]  Fax [phone]

## 2024-09-06 NOTE — Assessment & Plan Note (Addendum)
 With renal impairment High dose colchicine  1.2 mg p.o. one-time dose ordered followed by colchicine  0.3 mg p.o. one-time dose 1 hour later

## 2024-09-06 NOTE — ED Provider Notes (Signed)
 [facility] Provider Note    Event Date/Time   First MD Initiated Contact with Patient 09/06/24 1016     (approximate)   History   Fall   HPI  CASH DUCE is a 68 year old male with history of COPD on home oxygen, HTN, CVA presenting to the ER for evaluation of lightheadedness with associated syncopal episode and foot pain.  Patient notes that he has had intermittent episodes of lightheadedness over the past few months.  Yesterday he fell and hit his head.  He also notes that over the past few days he has had worsening pain in his right foot, similar to when he was recently admitted.  Denies chest pain or worsening shortness of breath from baseline.  Does report decreased appetite and poor p.o. intake in the setting of his foot pain.  Reviewed discharge summary from 916/2025.  At that time patient presented with right foot pain.  At that time he was admitted for right foot cellulitis with associated hypokalemia, hypomagnesemia, hypophosphatemia.     Physical Exam   Triage Vital Signs: ED Triage Vitals  Encounter Vitals Group     BP 09/06/24 0908 113/73     Girls Systolic BP Percentile --      Girls Diastolic BP Percentile --      Boys Systolic BP Percentile --      Boys Diastolic BP Percentile --      Pulse Rate 09/06/24 0912 (!) 142     Resp 09/06/24 0908 16     Temp 09/06/24 0908 98.3 F (36.8 C)     Temp Source 09/06/24 0908 Oral     SpO2 09/06/24 0912 94 %     Weight 09/06/24 0909 186 lb 1.1 oz (84.4 kg)     Height --      Head Circumference --      Peak Flow --      Pain Score 09/06/24 0908 5     Pain Loc --      Pain Education --      Exclude from Growth Chart --     Most recent vital signs: Vitals:   09/06/24 1047 09/06/24 1257  BP: 105/68   Pulse: 92   Resp: 16   Temp:  98 F (36.7 C)  SpO2: 94%      General: Awake, interactive  CV:  Good peripheral perfusion, heart rate in the 90s at the time of my initial  evaluation Resp:  Unlabored respirations Abd:  Nondistended, soft, nontender Neuro:  Symmetric facial movement, fluid speech Other:   There is redness and swelling along the right MTP with tenderness throughout the forefoot.  Intact distal pulses.     ED Results / Procedures / Treatments   Labs (all labs ordered are listed, but only abnormal results are displayed) Labs Reviewed  COMPREHENSIVE METABOLIC PANEL WITH GFR - Abnormal; Notable for the following components:      Result Value   Sodium 131 (*)    Potassium 3.4 (*)    Chloride 88 (*)    Glucose, Bld 112 (*)    Creatinine, Ser 1.73 (*)    Total Protein 8.4 (*)    GFR, Estimated 42 (*)    All other components within normal limits  CBC - Abnormal; Notable for the following components:   WBC 13.5 (*)    RBC 4.16 (*)    Hemoglobin 11.0 (*)    HCT 34.3 (*)    All other components within  normal limits  URINALYSIS, ROUTINE W REFLEX MICROSCOPIC - Abnormal; Notable for the following components:   Color, Urine YELLOW (*)    APPearance CLEAR (*)    Hgb urine dipstick SMALL (*)    All other components within normal limits  URIC ACID - Abnormal; Notable for the following components:   Uric Acid, Serum 11.2 (*)    All other components within normal limits  SEDIMENTATION RATE - Abnormal; Notable for the following components:   Sed Rate 67 (*)    All other components within normal limits  BODY FLUID CULTURE W GRAM STAIN  MAGNESIUM   PHOSPHORUS  C-REACTIVE PROTEIN  HIV ANTIBODY (ROUTINE TESTING W REFLEX)  CBG MONITORING, ED     EKG EKG independently reviewed and interpreted by myself demonstrates:  EKG demonstrates suspected sinus rhythm at a rate of 130, PR 128, QRS 78, QTc 476, P waves most appreciable in lead V3  RADIOLOGY Imaging independently reviewed and interpreted by myself demonstrates:  CT head without bleed CT cervical spine without fracture   Formal Radiology Read:  CT Head Wo Contrast Result Date:  09/06/2024 CLINICAL DATA:  Status post fall. EXAM: CT HEAD WITHOUT CONTRAST TECHNIQUE: Contiguous axial images were obtained from the base of the skull through the vertex without intravenous contrast. RADIATION DOSE REDUCTION: This exam was performed according to the departmental dose-optimization program which includes automated exposure control, adjustment of the mA and/or kV according to patient size and/or use of iterative reconstruction technique. COMPARISON:  December 21, 2013 FINDINGS: Brain: There is generalized cerebral atrophy with widening of the extra-axial spaces and ventricular dilatation. There are areas of decreased attenuation within the white matter tracts of the supratentorial brain, consistent with microvascular disease changes. Vascular: Mild to moderate severity bilateral cavernous carotid artery calcification is noted. Skull: Normal. Negative for fracture or focal lesion. Sinuses/Orbits: No acute finding. Other: None. IMPRESSION: 1. No acute intracranial abnormality. 2. Generalized cerebral atrophy and microvascular disease changes of the supratentorial brain. Electronically Signed   By: Suzen Dials M.D.   On: 09/06/2024 12:34   CT Cervical Spine Wo Contrast Result Date: 09/06/2024 CLINICAL DATA:  Fall yesterday due to lightheadedness.  Neck trauma. EXAM: CT CERVICAL SPINE WITHOUT CONTRAST TECHNIQUE: Multidetector CT imaging of the cervical spine was performed without intravenous contrast. Multiplanar CT image reconstructions were also generated. RADIATION DOSE REDUCTION: This exam was performed according to the departmental dose-optimization program which includes automated exposure control, adjustment of the mA and/or kV according to patient size and/or use of iterative reconstruction technique. COMPARISON:  None recent. Cervical MRI 01/04/2019. Cervical spine radiographs 12/07/2018. FINDINGS: Alignment: Reversal of lordosis with chronic anterolisthesis at C2-3, similar to prior  studies. Skull base and vertebrae: No evidence of acute fracture or traumatic subluxation. Remote C4-5 ACDF without hardware and solid interbody ankylosis. Interval C3-4 ACDF. Ankylosed left C3-4 facet joint. Soft tissues and spinal canal: No prevertebral fluid or swelling. No visible canal hematoma. Disc levels: Multilevel spondylosis. At C2-3, there is asymmetric facet hypertrophy and uncinate spurring on the left, contributing to moderate left foraminal narrowing. Mild residual right-sided foraminal narrowing at the fused C3-4 level. Chronic spondylosis at C5-6 and C6-7 contributing to chronic spinal stenosis and foraminal narrowing, greatest on the right at C5-6. Upper chest: Clear lung apices. Other: Bilateral carotid atherosclerosis. IMPRESSION: 1. No evidence of acute cervical spine fracture, traumatic subluxation or static signs of instability. 2. Interval C3-4 ACDF with solid interbody ankylosis. 3. Remote C4-5 ACDF with solid interbody ankylosis. 4. Chronic spondylosis  at C5-6 and C6-7 with chronic spinal stenosis and foraminal narrowing, greatest on the right at C5-6. Electronically Signed   By: Elsie Perone M.D.   On: 09/06/2024 12:15    PROCEDURES:  Critical Care performed: No  Procedures   MEDICATIONS ORDERED IN ED: Medications  aspirin  EC tablet 81 mg (has no administration in time range)  amLODipine  (NORVASC ) tablet 5 mg (has no administration in time range)  atorvastatin  (LIPITOR ) tablet 80 mg (has no administration in time range)  FLUoxetine  (PROZAC ) capsule 40 mg (has no administration in time range)  rOPINIRole  (REQUIP ) tablet 1 mg (has no administration in time range)  ipratropium-albuterol  (DUONEB) 0.5-2.5 (3) MG/3ML nebulizer solution 3 mL (has no administration in time range)  fluticasone  furoate-vilanterol (BREO ELLIPTA ) 200-25 MCG/ACT 1 puff (has no administration in time range)  acetaminophen  (TYLENOL ) tablet 650 mg (has no administration in time range)    Or   acetaminophen  (TYLENOL ) suppository 650 mg (has no administration in time range)  ondansetron  (ZOFRAN ) tablet 4 mg (has no administration in time range)    Or  ondansetron  (ZOFRAN ) injection 4 mg (has no administration in time range)  heparin  injection 5,000 Units (has no administration in time range)  senna-docusate (Senokot-S) tablet 1 tablet (has no administration in time range)  lactated ringers  infusion ( Intravenous New Bag/Given 09/06/24 1335)  colchicine  tablet 0.3 mg (has no administration in time range)  morphine  (PF) 2 MG/ML injection 2 mg (has no administration in time range)  morphine  (PF) 4 MG/ML injection 4 mg (has no administration in time range)  fentaNYL  (SUBLIMAZE ) injection 25 mcg (has no administration in time range)  hydrALAZINE  (APRESOLINE ) injection 5 mg (has no administration in time range)  nicotine  (NICODERM CQ  - dosed in mg/24 hours) patch 21 mg (has no administration in time range)  melatonin tablet 5 mg (has no administration in time range)  HYDROmorphone  (DILAUDID ) injection 2 mg (has no administration in time range)  sodium chloride  0.9 % bolus 1,000 mL (1,000 mLs Intravenous New Bag/Given 09/06/24 1050)  morphine  (PF) 4 MG/ML injection 4 mg (4 mg Intravenous Given 09/06/24 1049)  HYDROmorphone  (DILAUDID ) injection 0.5 mg (0.5 mg Intravenous Given 09/06/24 1258)  potassium chloride  SA (KLOR-CON  M) CR tablet 20 mEq (20 mEq Oral Given 09/06/24 1330)  colchicine  tablet 1.2 mg (1.2 mg Oral Given 09/06/24 1330)     IMPRESSION / MDM / ASSESSMENT AND PLAN / ED COURSE  I reviewed the triage vital signs and the nursing notes.  Differential diagnosis includes, but is not limited to, intracranial bleed, spine fracture, anemia, electrolyte abnormality, arrhythmia, gout, lower suspicion septic arthritis  Patient's presentation is most consistent with acute presentation with potential threat to life or bodily function.  68 year old male presenting to the emergency  department for evaluation following a syncopal episode with head injury and foot pain, tachycardic on presentation.  Labs with leukocytosis WC of 13.5, stable to improved hemoglobin.  CMP notable for significant AKI with creatinine of 1.73, baseline around 0.8.  With recent fall and head trauma will obtain CT head and C-spine.  Regarding fluid, does have area of redness and pain.  Location over the MTP still seems most likely to be related to gout, but with his clinical history will discuss with podiatry.  Clinical Course as of 09/06/24 1344  Thu Sep 06, 2024  1100 Case discussed with Dr. Malvin with podiatry.  He did review patient's clinical history including recent admission.  He does think that gout is more likely  with his history, but does recommend uric acid level, ESR, CRP, and MRI of the foot to further evaluate.  With current history, no current plans for intervention.  [NR]  1239 CT head and C-spine without acute traumatic injuries.  Additional labs did demonstrate elevated uric acid level, elevated sed rate.  Heart rate remains improved here, but given significant AKI with syncopal episode doing patient is appropriate for admission.  Will reach out to hospitalist team. [NR]  1250 Received update from Dr. Malvin.  With elevated uric acid at 11.2, higher suspicion for gout.  Does recommend discontinuing MRI for now, obtaining x-Violetta Lavalle and trialing empiric colchicine  treatment.  With AKI, will review recommended dosing with pharmacy. [NR]  1251 Case reviewed with Marolyn with pharmacy team.  He does recommend going ahead with a 1.2 mg loading colchicine  dose, but did recommend considering a lower maintenance dose of perhaps 0.3 mg while watching patient's renal function. [NR]  1255 Case reviewed with Dr. Sherre with hospitalist team.  She will evaluate for anticipated admission. [NR]    Clinical Course User Index [NR] Levander Slate, MD     FINAL CLINICAL IMPRESSION(S) / ED DIAGNOSES   Final  diagnoses:  Syncope and collapse  AKI (acute kidney injury)  Acute gout involving toe of right foot, unspecified cause     Rx / DC Orders   ED Discharge Orders     None        Note:  This document was prepared using Dragon voice recognition software and may include unintentional dictation errors.   Levander Slate, MD 09/06/24 1344

## 2024-09-06 NOTE — Assessment & Plan Note (Signed)
 Not in acute exacerbation DuoNebs every 6 hours as needed for wheezing and shortness of breath Home long-acting inhaler equivalent ordered on admission

## 2024-09-06 NOTE — ED Triage Notes (Signed)
 Fall yesterday due to feeling lightheaded. Daughter in law states patient has become move lightheaded over the past several months.  Also c/o right foot swelling.  Has had similar in the past and was admitted for infection and electrolytes imbalance (hypo magnesium  and hypokalemia)  AAOx3. Skin warm and dry. NAD

## 2024-09-06 NOTE — Assessment & Plan Note (Signed)
-   Atorvastatin 80 mg nightly ?

## 2024-09-06 NOTE — Assessment & Plan Note (Signed)
 Home fluoxetine  40 mg daily resumed

## 2024-09-06 NOTE — Plan of Care (Signed)
  Problem: Education: Goal: Knowledge of General Education information will improve Description: Including pain rating scale, medication(s)/side effects and non-pharmacologic comfort measures Outcome: Progressing   Problem: Clinical Measurements: Goal: Ability to maintain clinical measurements within normal limits will improve Outcome: Progressing   Problem: Activity: Goal: Risk for activity intolerance will decrease Outcome: Not Progressing   Problem: Coping: Goal: Level of anxiety will decrease Outcome: Progressing   Problem: Pain Managment: Goal: General experience of comfort will improve and/or be controlled Outcome: Progressing

## 2024-09-06 NOTE — Assessment & Plan Note (Signed)
Home tamsulosin 0.4 mg daily resumed

## 2024-09-06 NOTE — Assessment & Plan Note (Signed)
 With hypokalemia Suspect secondary to poor p.o. intake in setting of pain Status post sodium chloride  1 L bolus per EDP Continue with LR infusion at 125 mL/h, 1 day ordered Potassium chloride  20 mill colons p.o. one-time dose ordered

## 2024-09-06 NOTE — Assessment & Plan Note (Addendum)
 Home amlodipine  5 mg daily resumed Hydralazine  5 mg IV every 6 hours as needed for SBP greater 165, 5 days ordered

## 2024-09-07 ENCOUNTER — Other Ambulatory Visit: Payer: Self-pay

## 2024-09-07 DIAGNOSIS — J42 Unspecified chronic bronchitis: Secondary | ICD-10-CM | POA: Diagnosis not present

## 2024-09-07 DIAGNOSIS — M109 Gout, unspecified: Secondary | ICD-10-CM | POA: Diagnosis not present

## 2024-09-07 DIAGNOSIS — N179 Acute kidney failure, unspecified: Secondary | ICD-10-CM | POA: Diagnosis not present

## 2024-09-07 LAB — BASIC METABOLIC PANEL WITH GFR
Anion gap: 11 (ref 5–15)
BUN: 19 mg/dL (ref 8–23)
CO2: 29 mmol/L (ref 22–32)
Calcium: 8.6 mg/dL — ABNORMAL LOW (ref 8.9–10.3)
Chloride: 93 mmol/L — ABNORMAL LOW (ref 98–111)
Creatinine, Ser: 1.26 mg/dL — ABNORMAL HIGH (ref 0.61–1.24)
GFR, Estimated: >60 mL/min (ref >60)
Glucose, Bld: 113 mg/dL — ABNORMAL HIGH (ref 70–99)
Potassium: 4.1 mmol/L (ref 3.5–5.1)
Sodium: 133 mmol/L — ABNORMAL LOW (ref 135–145)

## 2024-09-07 LAB — CBC
HCT: 28.2 % — ABNORMAL LOW (ref 39.0–52.0)
Hemoglobin: 9.1 g/dL — ABNORMAL LOW (ref 13.0–17.0)
MCH: 26.6 pg (ref 26.0–34.0)
MCHC: 32.3 g/dL (ref 30.0–36.0)
MCV: 82.5 fL (ref 80.0–100.0)
Platelets: 306 K/uL (ref 150–400)
RBC: 3.42 MIL/uL — ABNORMAL LOW (ref 4.22–5.81)
RDW: 14.4 % (ref 11.5–15.5)
WBC: 11.2 K/uL — ABNORMAL HIGH (ref 4.0–10.5)
nRBC: 0 % (ref 0.0–0.2)

## 2024-09-07 LAB — SURGICAL PATHOLOGY

## 2024-09-07 MED ORDER — SODIUM CHLORIDE 0.9 % IV BOLUS
500.0000 mL | Freq: Once | INTRAVENOUS | Status: AC
  Administered 2024-09-07: 500 mL via INTRAVENOUS

## 2024-09-07 MED ORDER — DOXYCYCLINE HYCLATE 100 MG PO TABS
100.0000 mg | ORAL_TABLET | Freq: Two times a day (BID) | ORAL | 0 refills | Status: AC
  Filled 2024-09-07: qty 14, 7d supply, fill #0

## 2024-09-07 MED ORDER — AMOXICILLIN-POT CLAVULANATE 875-125 MG PO TABS
1.0000 | ORAL_TABLET | Freq: Two times a day (BID) | ORAL | 0 refills | Status: AC
  Filled 2024-09-07: qty 14, 7d supply, fill #0

## 2024-09-07 NOTE — TOC CM/SW Note (Signed)
 Transition of Care [facility]) - Inpatient Brief Assessment   Patient Details  Name: ATLEY NEUBERT MRN: [id number] Date of Birth: 22-Jun-1956  Transition of Care [facility]) CM/SW Contact:    Corean ONEIDA Haddock, RN Phone Number: 09/07/2024, 11:08 AM   Clinical Narrative:   Transition of Care [facility]) Screening Note   Patient Details  Name: FREMON ZACHARIA Date of Birth: Nov 14, 1956   Transition of Care [facility]) CM/SW Contact:    Corean ONEIDA Haddock, RN Phone Number: 09/07/2024, 11:08 AM    Transition of Care Department [facility]) has reviewed patient and no TOC needs have been identified at this time. . If new patient transition needs arise, please place a TOC consult.  Per SDOH social isolation resources added to AVS   Transition of Care Asessment: Insurance and Status: Insurance coverage has been reviewed Patient has primary care physician: Yes     Prior/Current Home Services: No current home services Social Drivers of Health Review: SDOH reviewed interventions complete Readmission risk has been reviewed: No (obs status.  no score generated) Transition of care needs: no transition of care needs at this time

## 2024-09-07 NOTE — Plan of Care (Signed)
 Problem: Education: Goal: Knowledge of General Education information will improve Description: Including pain rating scale, medication(s)/side effects and non-pharmacologic comfort measures 09/07/2024 1054 by Les Delon CROME, RN Outcome: Adequate for Discharge 09/07/2024 1053 by Les Delon CROME, RN Outcome: Adequate for Discharge 09/07/2024 1053 by Les Delon CROME, RN Outcome: Not Progressing 09/07/2024 1053 by Les Delon CROME, RN Outcome: Progressing   Problem: Health Behavior/Discharge Planning: Goal: Ability to manage health-related needs will improve 09/07/2024 1054 by Les Delon CROME, RN Outcome: Adequate for Discharge 09/07/2024 1053 by Les Delon CROME, RN Outcome: Adequate for Discharge 09/07/2024 1053 by Les Delon CROME, RN Outcome: Not Progressing   Problem: Clinical Measurements: Goal: Ability to maintain clinical measurements within normal limits will improve 09/07/2024 1054 by Les Delon CROME, RN Outcome: Adequate for Discharge 09/07/2024 1053 by Les Delon CROME, RN Outcome: Adequate for Discharge 09/07/2024 1053 by Les Delon CROME, RN Outcome: Not Progressing Goal: Will remain free from infection 09/07/2024 1054 by Les Delon CROME, RN Outcome: Adequate for Discharge 09/07/2024 1053 by Les Delon CROME, RN Outcome: Adequate for Discharge 09/07/2024 1053 by Les Delon CROME, RN Outcome: Not Progressing Goal: Diagnostic test results will improve 09/07/2024 1054 by Les Delon CROME, RN Outcome: Adequate for Discharge 09/07/2024 1053 by Les Delon CROME, RN Outcome: Adequate for Discharge 09/07/2024 1053 by Les Delon CROME, RN Outcome: Not Progressing Goal: Respiratory complications will improve 09/07/2024 1054 by Les Delon CROME, RN Outcome: Adequate for Discharge 09/07/2024 1053 by Les Delon CROME, RN Outcome: Adequate for  Discharge 09/07/2024 1053 by Les Delon CROME, RN Outcome: Not Progressing Goal: Cardiovascular complication will be avoided 09/07/2024 1054 by Les Delon CROME, RN Outcome: Adequate for Discharge 09/07/2024 1053 by Les Delon CROME, RN Outcome: Adequate for Discharge 09/07/2024 1053 by Les Delon CROME, RN Outcome: Not Progressing   Problem: Activity: Goal: Risk for activity intolerance will decrease 09/07/2024 1054 by Les Delon CROME, RN Outcome: Adequate for Discharge 09/07/2024 1053 by Les Delon CROME, RN Outcome: Adequate for Discharge 09/07/2024 1053 by Les Delon CROME, RN Outcome: Not Progressing 09/07/2024 1053 by Les Delon CROME, RN Outcome: Progressing   Problem: Nutrition: Goal: Adequate nutrition will be maintained 09/07/2024 1054 by Les Delon CROME, RN Outcome: Adequate for Discharge 09/07/2024 1053 by Les Delon CROME, RN Outcome: Adequate for Discharge 09/07/2024 1053 by Les Delon CROME, RN Outcome: Not Progressing   Problem: Coping: Goal: Level of anxiety will decrease 09/07/2024 1054 by Les Delon CROME, RN Outcome: Adequate for Discharge 09/07/2024 1053 by Les Delon CROME, RN Outcome: Adequate for Discharge 09/07/2024 1053 by Les Delon CROME, RN Outcome: Not Progressing 09/07/2024 1053 by Les Delon CROME, RN Outcome: Progressing   Problem: Elimination: Goal: Will not experience complications related to bowel motility 09/07/2024 1054 by Les Delon CROME, RN Outcome: Adequate for Discharge 09/07/2024 1053 by Les Delon CROME, RN Outcome: Adequate for Discharge 09/07/2024 1053 by Les Delon CROME, RN Outcome: Not Progressing Goal: Will not experience complications related to urinary retention 09/07/2024 1054 by Les Delon CROME, RN Outcome: Adequate for Discharge 09/07/2024 1053 by Les Delon CROME, RN Outcome: Adequate  for Discharge 09/07/2024 1053 by Les Delon CROME, RN Outcome: Not Progressing   Problem: Pain Managment: Goal: General experience of comfort will improve and/or be controlled 09/07/2024 1054 by Les Delon CROME, RN Outcome: Adequate for Discharge 09/07/2024 1053 by Les Delon CROME, RN Outcome: Adequate for Discharge 09/07/2024 1053 by Les Delon CROME, RN Outcome: Not Progressing   Problem: Safety: Goal: Ability to remain free from injury will improve  09/07/2024 1054 by Les Delon CROME, RN Outcome: Adequate for Discharge 09/07/2024 1053 by Les Delon CROME, RN Outcome: Adequate for Discharge 09/07/2024 1053 by Les Delon CROME, RN Outcome: Not Progressing   Problem: Skin Integrity: Goal: Risk for impaired skin integrity will decrease 09/07/2024 1054 by Les Delon CROME, RN Outcome: Adequate for Discharge 09/07/2024 1053 by Les Delon CROME, RN Outcome: Adequate for Discharge 09/07/2024 1053 by Les Delon CROME, RN Outcome: Not Progressing

## 2024-09-07 NOTE — Plan of Care (Signed)
  Problem: Education: Goal: Knowledge of General Education information will improve Description: Including pain rating scale, medication(s)/side effects and non-pharmacologic comfort measures 09/07/2024 1053 by Les Delon CROME, RN Outcome: Adequate for Discharge 09/07/2024 1053 by Les Delon CROME, RN Outcome: Not Progressing 09/07/2024 1053 by Les Delon CROME, RN Outcome: Progressing   Problem: Health Behavior/Discharge Planning: Goal: Ability to manage health-related needs will improve 09/07/2024 1053 by Les Delon CROME, RN Outcome: Adequate for Discharge 09/07/2024 1053 by Les Delon CROME, RN Outcome: Not Progressing   Problem: Clinical Measurements: Goal: Ability to maintain clinical measurements within normal limits will improve 09/07/2024 1053 by Les Delon CROME, RN Outcome: Adequate for Discharge 09/07/2024 1053 by Les Delon CROME, RN Outcome: Not Progressing Goal: Will remain free from infection 09/07/2024 1053 by Les Delon CROME, RN Outcome: Adequate for Discharge 09/07/2024 1053 by Les Delon CROME, RN Outcome: Not Progressing Goal: Diagnostic test results will improve 09/07/2024 1053 by Les Delon CROME, RN Outcome: Adequate for Discharge 09/07/2024 1053 by Les Delon CROME, RN Outcome: Not Progressing Goal: Respiratory complications will improve 09/07/2024 1053 by Les Delon CROME, RN Outcome: Adequate for Discharge 09/07/2024 1053 by Les Delon CROME, RN Outcome: Not Progressing Goal: Cardiovascular complication will be avoided 09/07/2024 1053 by Les Delon CROME, RN Outcome: Adequate for Discharge 09/07/2024 1053 by Les Delon CROME, RN Outcome: Not Progressing   Problem: Activity: Goal: Risk for activity intolerance will decrease 09/07/2024 1053 by Les Delon CROME, RN Outcome: Adequate for Discharge 09/07/2024 1053 by Les Delon CROME,  RN Outcome: Not Progressing 09/07/2024 1053 by Les Delon CROME, RN Outcome: Progressing   Problem: Nutrition: Goal: Adequate nutrition will be maintained 09/07/2024 1053 by Les Delon CROME, RN Outcome: Adequate for Discharge 09/07/2024 1053 by Les Delon CROME, RN Outcome: Not Progressing   Problem: Coping: Goal: Level of anxiety will decrease 09/07/2024 1053 by Les Delon CROME, RN Outcome: Adequate for Discharge 09/07/2024 1053 by Les Delon CROME, RN Outcome: Not Progressing 09/07/2024 1053 by Les Delon CROME, RN Outcome: Progressing   Problem: Elimination: Goal: Will not experience complications related to bowel motility 09/07/2024 1053 by Les Delon CROME, RN Outcome: Adequate for Discharge 09/07/2024 1053 by Les Delon CROME, RN Outcome: Not Progressing Goal: Will not experience complications related to urinary retention 09/07/2024 1053 by Les Delon CROME, RN Outcome: Adequate for Discharge 09/07/2024 1053 by Les Delon CROME, RN Outcome: Not Progressing   Problem: Pain Managment: Goal: General experience of comfort will improve and/or be controlled 09/07/2024 1053 by Les Delon CROME, RN Outcome: Adequate for Discharge 09/07/2024 1053 by Les Delon CROME, RN Outcome: Not Progressing   Problem: Safety: Goal: Ability to remain free from injury will improve 09/07/2024 1053 by Les Delon CROME, RN Outcome: Adequate for Discharge 09/07/2024 1053 by Les Delon CROME, RN Outcome: Not Progressing   Problem: Skin Integrity: Goal: Risk for impaired skin integrity will decrease 09/07/2024 1053 by Les Delon CROME, RN Outcome: Adequate for Discharge 09/07/2024 1053 by Les Delon CROME, RN Outcome: Not Progressing

## 2024-09-07 NOTE — Progress Notes (Signed)
 Patient being discharged home.  Patient to be transported by his daughter. IV removed with the catheter intact. Prescriptions from pharmacy and TOC given to the patient. Discharge instructions given to the patient who verbalized understanding.

## 2024-09-07 NOTE — Plan of Care (Signed)
   Problem: Education: Goal: Knowledge of General Education information will improve Description: Including pain rating scale, medication(s)/side effects and non-pharmacologic comfort measures Outcome: Progressing   Problem: Activity: Goal: Risk for activity intolerance will decrease Outcome: Progressing   Problem: Coping: Goal: Level of anxiety will decrease Outcome: Progressing

## 2024-09-07 NOTE — Plan of Care (Signed)
 Problem: Education: Goal: Knowledge of General Education information will improve Description: Including pain rating scale, medication(s)/side effects and non-pharmacologic comfort measures 09/07/2024 1055 by Les Delon CROME, RN Outcome: Adequate for Discharge 09/07/2024 1054 by Les Delon CROME, RN Outcome: Adequate for Discharge 09/07/2024 1053 by Les Delon CROME, RN Outcome: Adequate for Discharge 09/07/2024 1053 by Les Delon CROME, RN Outcome: Not Progressing 09/07/2024 1053 by Les Delon CROME, RN Outcome: Progressing   Problem: Health Behavior/Discharge Planning: Goal: Ability to manage health-related needs will improve 09/07/2024 1055 by Les Delon CROME, RN Outcome: Adequate for Discharge 09/07/2024 1054 by Les Delon CROME, RN Outcome: Adequate for Discharge 09/07/2024 1053 by Les Delon CROME, RN Outcome: Adequate for Discharge 09/07/2024 1053 by Les Delon CROME, RN Outcome: Not Progressing   Problem: Clinical Measurements: Goal: Ability to maintain clinical measurements within normal limits will improve 09/07/2024 1055 by Les Delon CROME, RN Outcome: Adequate for Discharge 09/07/2024 1054 by Les Delon CROME, RN Outcome: Adequate for Discharge 09/07/2024 1053 by Les Delon CROME, RN Outcome: Adequate for Discharge 09/07/2024 1053 by Les Delon CROME, RN Outcome: Not Progressing Goal: Will remain free from infection 09/07/2024 1055 by Les Delon CROME, RN Outcome: Adequate for Discharge 09/07/2024 1054 by Les Delon CROME, RN Outcome: Adequate for Discharge 09/07/2024 1053 by Les Delon CROME, RN Outcome: Adequate for Discharge 09/07/2024 1053 by Les Delon CROME, RN Outcome: Not Progressing Goal: Diagnostic test results will improve 09/07/2024 1055 by Les Delon CROME, RN Outcome: Adequate for Discharge 09/07/2024 1054 by Les Delon CROME,  RN Outcome: Adequate for Discharge 09/07/2024 1053 by Les Delon CROME, RN Outcome: Adequate for Discharge 09/07/2024 1053 by Les Delon CROME, RN Outcome: Not Progressing Goal: Respiratory complications will improve 09/07/2024 1055 by Les Delon CROME, RN Outcome: Adequate for Discharge 09/07/2024 1054 by Les Delon CROME, RN Outcome: Adequate for Discharge 09/07/2024 1053 by Les Delon CROME, RN Outcome: Adequate for Discharge 09/07/2024 1053 by Les Delon CROME, RN Outcome: Not Progressing Goal: Cardiovascular complication will be avoided 09/07/2024 1055 by Les Delon CROME, RN Outcome: Adequate for Discharge 09/07/2024 1054 by Les Delon CROME, RN Outcome: Adequate for Discharge 09/07/2024 1053 by Les Delon CROME, RN Outcome: Adequate for Discharge 09/07/2024 1053 by Les Delon CROME, RN Outcome: Not Progressing   Problem: Activity: Goal: Risk for activity intolerance will decrease 09/07/2024 1055 by Les Delon CROME, RN Outcome: Adequate for Discharge 09/07/2024 1054 by Les Delon CROME, RN Outcome: Adequate for Discharge 09/07/2024 1053 by Les Delon CROME, RN Outcome: Adequate for Discharge 09/07/2024 1053 by Les Delon CROME, RN Outcome: Not Progressing 09/07/2024 1053 by Les Delon CROME, RN Outcome: Progressing   Problem: Nutrition: Goal: Adequate nutrition will be maintained 09/07/2024 1055 by Les Delon CROME, RN Outcome: Adequate for Discharge 09/07/2024 1054 by Les Delon CROME, RN Outcome: Adequate for Discharge 09/07/2024 1053 by Les Delon CROME, RN Outcome: Adequate for Discharge 09/07/2024 1053 by Les Delon CROME, RN Outcome: Not Progressing   Problem: Coping: Goal: Level of anxiety will decrease 09/07/2024 1055 by Les Delon CROME, RN Outcome: Adequate for Discharge 09/07/2024 1054 by Les Delon CROME, RN Outcome:  Adequate for Discharge 09/07/2024 1053 by Les Delon CROME, RN Outcome: Adequate for Discharge 09/07/2024 1053 by Les Delon CROME, RN Outcome: Not Progressing 09/07/2024 1053 by Les Delon CROME, RN Outcome: Progressing   Problem: Elimination: Goal: Will not experience complications related to bowel motility 09/07/2024 1055 by Les Delon CROME, RN Outcome: Adequate for Discharge 09/07/2024 1054 by Les Delon CROME, RN Outcome: Adequate for Discharge 09/07/2024  1053 by Les Delon CROME, RN Outcome: Adequate for Discharge 09/07/2024 1053 by Les Delon CROME, RN Outcome: Not Progressing Goal: Will not experience complications related to urinary retention 09/07/2024 1055 by Les Delon CROME, RN Outcome: Adequate for Discharge 09/07/2024 1054 by Les Delon CROME, RN Outcome: Adequate for Discharge 09/07/2024 1053 by Les Delon CROME, RN Outcome: Adequate for Discharge 09/07/2024 1053 by Les Delon CROME, RN Outcome: Not Progressing   Problem: Pain Managment: Goal: General experience of comfort will improve and/or be controlled 09/07/2024 1055 by Les Delon CROME, RN Outcome: Adequate for Discharge 09/07/2024 1054 by Les Delon CROME, RN Outcome: Adequate for Discharge 09/07/2024 1053 by Les Delon CROME, RN Outcome: Adequate for Discharge 09/07/2024 1053 by Les Delon CROME, RN Outcome: Not Progressing   Problem: Safety: Goal: Ability to remain free from injury will improve 09/07/2024 1055 by Les Delon CROME, RN Outcome: Adequate for Discharge 09/07/2024 1054 by Les Delon CROME, RN Outcome: Adequate for Discharge 09/07/2024 1053 by Les Delon CROME, RN Outcome: Adequate for Discharge 09/07/2024 1053 by Les Delon CROME, RN Outcome: Not Progressing   Problem: Skin Integrity: Goal: Risk for impaired skin integrity will decrease 09/07/2024 1055 by  Les Delon CROME, RN Outcome: Adequate for Discharge 09/07/2024 1054 by Les Delon CROME, RN Outcome: Adequate for Discharge 09/07/2024 1053 by Les Delon CROME, RN Outcome: Adequate for Discharge 09/07/2024 1053 by Les Delon CROME, RN Outcome: Not Progressing

## 2024-09-07 NOTE — Care Management Obs Status (Signed)
 MEDICARE OBSERVATION STATUS NOTIFICATION   Patient Details  Name: STEFFON GLADU MRN: [id number] Date of Birth: [birth year]   Medicare Observation Status Notification Given:  Yes    Rojelio SHAUNNA Rattler 09/07/2024, 12:33 PM

## 2024-09-07 NOTE — Plan of Care (Signed)
 Problem: Education: Goal: Knowledge of General Education information will improve Description: Including pain rating scale, medication(s)/side effects and non-pharmacologic comfort measures 09/07/2024 1054 by Les Delon CROME, RN Outcome: Adequate for Discharge 09/07/2024 1053 by Les Delon CROME, RN Outcome: Adequate for Discharge 09/07/2024 1053 by Les Delon CROME, RN Outcome: Not Progressing 09/07/2024 1053 by Les Delon CROME, RN Outcome: Progressing   Problem: Health Behavior/Discharge Planning: Goal: Ability to manage health-related needs will improve 09/07/2024 1054 by Les Delon CROME, RN Outcome: Adequate for Discharge 09/07/2024 1053 by Les Delon CROME, RN Outcome: Adequate for Discharge 09/07/2024 1053 by Les Delon CROME, RN Outcome: Not Progressing   Problem: Clinical Measurements: Goal: Ability to maintain clinical measurements within normal limits will improve 09/07/2024 1054 by Les Delon CROME, RN Outcome: Adequate for Discharge 09/07/2024 1053 by Les Delon CROME, RN Outcome: Adequate for Discharge 09/07/2024 1053 by Les Delon CROME, RN Outcome: Not Progressing Goal: Will remain free from infection 09/07/2024 1054 by Les Delon CROME, RN Outcome: Adequate for Discharge 09/07/2024 1053 by Les Delon CROME, RN Outcome: Adequate for Discharge 09/07/2024 1053 by Les Delon CROME, RN Outcome: Not Progressing Goal: Diagnostic test results will improve 09/07/2024 1054 by Les Delon CROME, RN Outcome: Adequate for Discharge 09/07/2024 1053 by Les Delon CROME, RN Outcome: Adequate for Discharge 09/07/2024 1053 by Les Delon CROME, RN Outcome: Not Progressing Goal: Respiratory complications will improve 09/07/2024 1054 by Les Delon CROME, RN Outcome: Adequate for Discharge 09/07/2024 1053 by Les Delon CROME, RN Outcome: Adequate for  Discharge 09/07/2024 1053 by Les Delon CROME, RN Outcome: Not Progressing Goal: Cardiovascular complication will be avoided 09/07/2024 1054 by Les Delon CROME, RN Outcome: Adequate for Discharge 09/07/2024 1053 by Les Delon CROME, RN Outcome: Adequate for Discharge 09/07/2024 1053 by Les Delon CROME, RN Outcome: Not Progressing   Problem: Nutrition: Goal: Adequate nutrition will be maintained 09/07/2024 1054 by Les Delon CROME, RN Outcome: Adequate for Discharge 09/07/2024 1053 by Les Delon CROME, RN Outcome: Adequate for Discharge 09/07/2024 1053 by Les Delon CROME, RN Outcome: Not Progressing   Problem: Coping: Goal: Level of anxiety will decrease 09/07/2024 1054 by Les Delon CROME, RN Outcome: Adequate for Discharge 09/07/2024 1053 by Les Delon CROME, RN Outcome: Adequate for Discharge 09/07/2024 1053 by Les Delon CROME, RN Outcome: Not Progressing 09/07/2024 1053 by Les Delon CROME, RN Outcome: Progressing   Problem: Elimination: Goal: Will not experience complications related to bowel motility 09/07/2024 1054 by Les Delon CROME, RN Outcome: Adequate for Discharge 09/07/2024 1053 by Les Delon CROME, RN Outcome: Adequate for Discharge 09/07/2024 1053 by Les Delon CROME, RN Outcome: Not Progressing Goal: Will not experience complications related to urinary retention 09/07/2024 1054 by Les Delon CROME, RN Outcome: Adequate for Discharge 09/07/2024 1053 by Les Delon CROME, RN Outcome: Adequate for Discharge 09/07/2024 1053 by Les Delon CROME, RN Outcome: Not Progressing   Problem: Pain Managment: Goal: General experience of comfort will improve and/or be controlled 09/07/2024 1054 by Les Delon CROME, RN Outcome: Adequate for Discharge 09/07/2024 1053 by Les Delon CROME, RN Outcome: Adequate for Discharge 09/07/2024 1053 by  Les Delon CROME, RN Outcome: Not Progressing   Problem: Safety: Goal: Ability to remain free from injury will improve 09/07/2024 1054 by Les Delon CROME, RN Outcome: Adequate for Discharge 09/07/2024 1053 by Les Delon CROME, RN Outcome: Adequate for Discharge 09/07/2024 1053 by Les Delon CROME, RN Outcome: Not Progressing   Problem: Skin Integrity: Goal: Risk for impaired skin integrity will decrease 09/07/2024 1054 by Les Delon CROME, RN  Outcome: Adequate for Discharge 09/07/2024 1053 by Les Delon CROME, RN Outcome: Adequate for Discharge 09/07/2024 1053 by Les Delon CROME, RN Outcome: Not Progressing

## 2024-09-07 NOTE — Plan of Care (Signed)

## 2024-09-07 NOTE — Discharge Instructions (Signed)

## 2024-09-07 NOTE — Discharge Summary (Signed)
 Physician Discharge Summary   Patient: Bradley Sawyer MRN: [id number] DOB: 1956/06/01  Admit date:     09/06/2024  Discharge date: 09/07/24  Discharge Physician: Murvin Mana   PCP: Corrington, Kip A, MD   Recommendations at discharge:   Follow-up with PCP in 1 week. Follow-up with podiatry in 1 week to follow-up on the culture results from the joint aspiration  Discharge Diagnoses: Principal Problem:   AKI (acute kidney injury) Active Problems:   Gout attack   Essential hypertension   COPD (chronic obstructive pulmonary disease) (HCC)   CAD (coronary artery disease)   Hyperlipemia   GERD   Chronic pain, radicular   Leucocytosis   BPH (benign prostatic hyperplasia)   Depression with anxiety   Chronic pain disorder   PAD (peripheral artery disease)   TOBACCO ABUSE   Cerebral infarction (HCC)  Resolved Problems:   * No resolved hospital problems. [facility] Course: Bradley Sawyer is a 68 year old male with history of COPD, hypertension, history of CVA, who presents emergency department for chief concerns of feeling lightheadedness with right foot swelling and pain.  Vitals in the ED showed t 98.3, rr 16, hr 142 initially and then improved to 88, blood pressure 104/66, SpO2 of 95% on 4 L nasal cannula.  Serum sodium is 131, potassium 3.4, chloride 88, bicarb 32, BUN of 22, serum creatinine 1.73, eGFR 42, nonfasting blood glucose 112, WBC 13.5, hemoglobin 11, platelet 387.  Foot X ray showed 1. New intra-articular nondisplaced fracture of the medial base of the proximal phalanx of the right great toe. 2. Severe hallux valgus deformity is increased compared to the prior exam . 3. Mild first MTP joint osteoarthritis.  Patient was seen by podiatry, believe this is most likely a gout attack.  Was given 2 doses of colchicine .  Pain is getting better. Discussed with Dr. Malvin, pathology from bone biopsy, culture from joint aspiration still pending.  Gram stain has a  few white cells, no bacteria. Okay to discharge from podiatry standpoint, Dr. Janit will follow-up on culture results and biopsy results.  Will treat with 7 days of Augmentin  and doxycycline  in case there is a infection.  Cultures so far has no growth.   Assessment and Plan: * AKI (acute kidney injury) Hypokalemia. Hyponatremia. Patient received IV fluids, creatinine has dropped down to 1.26 from 1.73.  Potassium normalized.  Sodium level 133 stable.  Medically stable for discharge.   Gout attack New intra-articular nondisplaced fracture of the medial base of the proximal phalanx of the right great toe Patient received culture and sent.  Pain is better.  Joint aspiration performed, very few white cells, no bacteria seen on Gram stain.  Discussed with Dr. Malvin, will continue 1 week of doxycycline  and Augmentin . Set up home PT.  No surgical option needed.   CAD (coronary artery disease) Resume home treatment.  COPD (chronic obstructive pulmonary disease) (HCC) Chronic hypoxemic respiratory failure. Not in acute exacerbation  Essential hypertension Hold lisinopril /HCTZ for acute renal failure.  Restarted rest of blood pressure medicines  Depression with anxiety Home fluoxetine  40 mg daily resumed  BPH (benign prostatic hyperplasia) Continue home medicines  Leucocytosis Suspect reactive in setting of acute gout flare  Chronic pain, radicular Continue home medicines  GERD PPI and home famotidine 40 mg daily resumed  Hyperlipemia Atorvastatin  80 mg nightly  TOBACCO ABUSE Advised to quit        Consultants: Podiatry Procedures performed: Joint aspiration Disposition: Home health Diet recommendation:  Discharge Diet Orders (From admission, onward)     Start     Ordered   09/07/24 0000  Diet - low sodium heart healthy        09/07/24 1037           Cardiac diet DISCHARGE MEDICATION: Allergies as of 09/07/2024       Reactions   Bee Venom Swelling    Tapentadol Shortness Of Breath   Other Reaction(s): sob/tachycardia        Medication List     STOP taking these medications    lisinopril -hydrochlorothiazide  20-25 MG tablet Commonly known as: ZESTORETIC        TAKE these medications    albuterol  108 (90 Base) MCG/ACT inhaler Commonly known as: VENTOLIN  HFA INHALE 2 PUFFS BY MOUTH EVERY 4-6 HOURS AS NEEDED FOR WHEEZING   albuterol  (2.5 MG/3ML) 0.083% nebulizer solution Commonly known as: PROVENTIL  Take 2.5 mg by nebulization every 6 (six) hours as needed for wheezing or shortness of breath.   amLODipine  5 MG tablet Commonly known as: NORVASC  Take 5 mg by mouth daily.   amoxicillin -clavulanate 875-125 MG tablet Commonly known as: AUGMENTIN  Take 1 tablet by mouth 2 (two) times daily for 7 days.   aspirin  EC 81 MG tablet Take 81 mg by mouth daily.   atorvastatin  80 MG tablet Commonly known as: LIPITOR  TAKE ONE TABLET BY MOUTH ONCE DAILY AT BEDTIME   Breztri  Aerosphere 160-9-4.8 MCG/ACT Aero inhaler Generic drug: budesonide -glycopyrrolate -formoterol  INHALE TWO PUFFS into THE lungs TWICE DAILY morning & bedtime   clopidogrel  75 MG tablet Commonly known as: PLAVIX  TAKE ONE TABLET BY MOUTH ONCE DAILY   cyclobenzaprine  10 MG tablet Commonly known as: FLEXERIL  Take 10 mg by mouth 2 (two) times daily as needed.   doxycycline  100 MG tablet Commonly known as: VIBRA -TABS Take 1 tablet (100 mg total) by mouth 2 (two) times daily for 7 days.   EPINEPHrine 0.3 mg/0.3 mL Soaj injection Commonly known as: EPI-PEN Inject 0.3 mg into the muscle as needed for anaphylaxis.   famotidine 40 MG tablet Commonly known as: PEPCID TAKE ONE TABLET BY MOUTH ONCE DAILY   Fe Fum-Vit C-Vit B12-FA Caps capsule Commonly known as: TRIGELS-F FORTE Take 1 capsule by mouth daily after breakfast.   FLUoxetine  40 MG capsule Commonly known as: PROZAC  Take 40 mg by mouth daily.   fluticasone -salmeterol 250-50 MCG/ACT Aepb Commonly  known as: ADVAIR Inhale 1 puff into the lungs 2 (two) times daily.   furosemide  20 MG tablet Commonly known as: LASIX  TAKE ONE TABLET BY MOUTH EVERY DAY   ipratropium-albuterol  0.5-2.5 (3) MG/3ML Soln Commonly known as: DUONEB Take 3 mLs by nebulization every 6 (six) hours as needed.   isosorbide  mononitrate 30 MG 24 hr tablet Commonly known as: IMDUR  Take 30 mg by mouth daily.   levocetirizine 5 MG tablet Commonly known as: XYZAL  Take 5 mg by mouth at bedtime.   linaclotide  290 MCG Caps capsule Commonly known as: LINZESS  Take 290 mcg by mouth daily before breakfast.   metFORMIN  500 MG tablet Commonly known as: GLUCOPHAGE  Take 500 mg by mouth daily.   montelukast  10 MG tablet Commonly known as: SINGULAIR  Take 10 mg by mouth daily.   Multi-Vitamins Tabs Take 1 tablet by mouth daily.   naloxone  4 MG/0.1ML Liqd nasal spray kit Commonly known as: NARCAN  Place 0.4 mg into the nose once.   nitroGLYCERIN  0.4 MG SL tablet Commonly known as: NITROSTAT  DISSOLVE (1) TABLET UNDER TONGUE AS NEEDED TO RELIEVE CHEST PAIN.  MAY REPEAT EVERY 5 MINUTES.   ondansetron  4 MG tablet Commonly known as: ZOFRAN  Take by mouth.   oxyCODONE  15 MG immediate release tablet Commonly known as: ROXICODONE  Take 15 mg by mouth every 4 (four) hours.   OXYGEN Inhale 3 L into the lungs.   pantoprazole  40 MG tablet Commonly known as: PROTONIX  TAKE ONE TABLET BY MOUTH TWICE DAILY BEFORE MEALS   polyethylene glycol 17 g packet Commonly known as: MIRALAX  / GLYCOLAX  Take 17 g by mouth daily as needed (for constipation).   rOPINIRole  1 MG tablet Commonly known as: REQUIP  Take 1 tablet by mouth at bedtime.   spironolactone 25 MG tablet Commonly known as: ALDACTONE TAKE ONE TABLET BY MOUTH ONCE DAILY   tamsulosin  0.4 MG Caps capsule Commonly known as: FLOMAX  TAKE ONE CAPSULE BY MOUTH EVERY DAY        Follow-up Information     Corrington, Kip A, MD Follow up in 1 week(s).   Specialty:  Family Medicine Contact information: [address] [place] [address] [phone]         Janit Thresa HERO, DPM Follow up in 1 week(s).   Specialty: Podiatry Contact information: [address] [place] [address] [phone]                Discharge Exam: Fredricka Weights   09/06/24 0909 09/06/24 1523  Weight: 84.4 kg 83.4 kg   General exam: Appears calm and comfortable  Respiratory system: Decreased breath sounds. Respiratory effort normal. Cardiovascular system: S1 & S2 heard, RRR. No JVD, murmurs, rubs, gallops or clicks. No pedal edema. Gastrointestinal system: Abdomen is nondistended, soft and nontender. No organomegaly or masses felt. Normal bowel sounds heard. Central nervous system: Alert and oriented. No focal neurological deficits. Extremities: Symmetric 5 x 5 power. Skin: No rashes, lesions or ulcers Psychiatry: Judgement and insight appear normal. Mood & affect appropriate.    Condition at discharge: good  The results of significant diagnostics from this hospitalization (including imaging, microbiology, ancillary and laboratory) are listed below for reference.   Imaging Studies: DG Foot Complete Right Result Date: 09/06/2024 EXAM: 3 OR MORE VIEW(S) XRAY OF THE RIGHT FOOT 09/06/2024 01:28:00 PM COMPARISON: Comparison to right foot radiographs 08/05/2024 09:14. CLINICAL HISTORY: Great toe pain and swelling. FINDINGS: BONES AND JOINTS: There is a new intra-articular nondisplaced fracture of the medial base of the proximal phalanx of the great toe. Severe hallux valgus deformity, increased compared to the prior exam. No joint dislocation. Mild first MTP (metatarsophalangeal) joint osteoarthritis. Calcaneal enthesopathy at the insertion of the achilles tendon and the origin of the central cord of the plantar fascia. SOFT TISSUES: Soft tissue swelling at the first MTP joint. IMPRESSION: 1. New intra-articular nondisplaced fracture of the medial base  of the proximal phalanx of the right great toe. 2. Severe hallux valgus deformity is increased compared to the prior exam . 3. Mild first MTP joint osteoarthritis. 4. Calcaneal enthesopathy. Electronically signed by: Harrietta Sherry MD 09/06/2024 02:08 PM EDT RP Workstation: [id number]   CT Head Wo Contrast Result Date: 09/06/2024 CLINICAL DATA:  Status post fall. EXAM: CT HEAD WITHOUT CONTRAST TECHNIQUE: Contiguous axial images were obtained from the base of the skull through the vertex without intravenous contrast. RADIATION DOSE REDUCTION: This exam was performed according to the departmental dose-optimization program which includes automated exposure control, adjustment of the mA and/or kV according to patient size and/or use of iterative reconstruction technique. COMPARISON:  December 21, 2013 FINDINGS: Brain: There is  generalized cerebral atrophy with widening of the extra-axial spaces and ventricular dilatation. There are areas of decreased attenuation within the white matter tracts of the supratentorial brain, consistent with microvascular disease changes. Vascular: Mild to moderate severity bilateral cavernous carotid artery calcification is noted. Skull: Normal. Negative for fracture or focal lesion. Sinuses/Orbits: No acute finding. Other: None. IMPRESSION: 1. No acute intracranial abnormality. 2. Generalized cerebral atrophy and microvascular disease changes of the supratentorial brain. Electronically Signed   By: Suzen Dials M.D.   On: 09/06/2024 12:34   CT Cervical Spine Wo Contrast Result Date: 09/06/2024 CLINICAL DATA:  Fall yesterday due to lightheadedness.  Neck trauma. EXAM: CT CERVICAL SPINE WITHOUT CONTRAST TECHNIQUE: Multidetector CT imaging of the cervical spine was performed without intravenous contrast. Multiplanar CT image reconstructions were also generated. RADIATION DOSE REDUCTION: This exam was performed according to the departmental dose-optimization program which includes  automated exposure control, adjustment of the mA and/or kV according to patient size and/or use of iterative reconstruction technique. COMPARISON:  None recent. Cervical MRI 01/04/2019. Cervical spine radiographs 12/07/2018. FINDINGS: Alignment: Reversal of lordosis with chronic anterolisthesis at C2-3, similar to prior studies. Skull base and vertebrae: No evidence of acute fracture or traumatic subluxation. Remote C4-5 ACDF without hardware and solid interbody ankylosis. Interval C3-4 ACDF. Ankylosed left C3-4 facet joint. Soft tissues and spinal canal: No prevertebral fluid or swelling. No visible canal hematoma. Disc levels: Multilevel spondylosis. At C2-3, there is asymmetric facet hypertrophy and uncinate spurring on the left, contributing to moderate left foraminal narrowing. Mild residual right-sided foraminal narrowing at the fused C3-4 level. Chronic spondylosis at C5-6 and C6-7 contributing to chronic spinal stenosis and foraminal narrowing, greatest on the right at C5-6. Upper chest: Clear lung apices. Other: Bilateral carotid atherosclerosis. IMPRESSION: 1. No evidence of acute cervical spine fracture, traumatic subluxation or static signs of instability. 2. Interval C3-4 ACDF with solid interbody ankylosis. 3. Remote C4-5 ACDF with solid interbody ankylosis. 4. Chronic spondylosis at C5-6 and C6-7 with chronic spinal stenosis and foraminal narrowing, greatest on the right at C5-6. Electronically Signed   By: Elsie Perone M.D.   On: 09/06/2024 12:15    Microbiology: Results for orders placed or performed during the hospital encounter of 09/06/24  Body fluid culture w Gram Stain     Status: None (Preliminary result)   Collection Time: 09/06/24  1:44 PM   Specimen: Tissue; Body Fluid  Result Value Ref Range Status   Specimen Description   Final    TISSUE Performed at [facility], [address], [place], [place] [address]    Special Requests   Final     RIGHT FOOT Performed at  [facility], [address] [place], [place] [address]    Gram Stain   Final    RARE WBC PRESENT, PREDOMINANTLY PMN NO ORGANISMS SEEN    Culture   Final    NO GROWTH < 12 HOURS Performed at [facility], [address], [place], [place] [address]    Report Status PENDING  Incomplete    Labs: CBC: Recent Labs  Lab 09/06/24 0919 09/07/24 0627  WBC 13.5* 11.2*  HGB 11.0* 9.1*  HCT 34.3* 28.2*  MCV 82.5 82.5  PLT 387 306   Basic Metabolic Panel: Recent Labs  Lab 09/06/24 0919 09/07/24 0627  NA 131* 133*  K 3.4* 4.1  CL 88* 93*  CO2 32 29  GLUCOSE 112* 113*  BUN 22 19  CREATININE 1.73* 1.26*  CALCIUM   9.3 8.6*  MG 2.0  --   PHOS 3.3  --    Liver Function Tests: Recent Labs  Lab 09/06/24 0919  AST 20  ALT 9  ALKPHOS 80  BILITOT 0.5  PROT 8.4*  ALBUMIN 4.2   CBG: No results for input(s): GLUCAP in the last 168 hours.  Discharge time spent: 35 minutes.  Signed: Murvin Mana, MD [facility] 09/07/2024

## 2024-09-10 LAB — BODY FLUID CULTURE W GRAM STAIN: Culture: NO GROWTH

## 2024-09-14 ENCOUNTER — Ambulatory Visit (INDEPENDENT_AMBULATORY_CARE_PROVIDER_SITE_OTHER): Admitting: Podiatry

## 2024-09-14 ENCOUNTER — Encounter: Payer: Self-pay | Admitting: Podiatry

## 2024-09-14 VITALS — Ht 71.0 in | Wt 183.9 lb

## 2024-09-14 DIAGNOSIS — M10071 Idiopathic gout, right ankle and foot: Secondary | ICD-10-CM | POA: Diagnosis not present

## 2024-09-14 MED ORDER — BETAMETHASONE SOD PHOS & ACET 6 (3-3) MG/ML IJ SUSP
3.0000 mg | Freq: Once | INTRAMUSCULAR | Status: AC
  Administered 2024-09-14: 3 mg via INTRA_ARTICULAR

## 2024-09-14 MED ORDER — COLCHICINE 0.6 MG PO TABS: 0.6000 mg | ORAL_TABLET | Freq: Every day | ORAL | 1 refills | Status: AC

## 2024-09-14 NOTE — Progress Notes (Signed)
 Chief Complaint  Patient presents with   Foot Pain    Pt is here for right foot pain, he was seen in the ER on 10/16 x-rays were done was told that he has gout and arthritis in the foot, also has a sore to the bottom of the foot, he is currently wearing an boot states it is helping some just heavy.    HPI: 68 y.o. male presenting today recently discharged from the [facility] hospital for pain and inflammation to the right great toe.  Diagnosed with gout in the past.  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   Compression fracture    L2   COPD (chronic obstructive pulmonary disease) (HCC)    bronchitis   CVA (cerebral infarction)    Disorder of vocal cord    Dyspnea    Fatty liver    GERD (gastroesophageal reflux disease)    Hx of cardiovascular stress test    ETT/Lexiscan -Myoview (10/15):  Inferior, inferoapical, apical lateral defect c/w mild ischemia, EF 66%; Intermediate Risk   Hyperlipidemia    Hypertension    Right shoulder pain    2/2 partial rotator cuff tear, tendinopathy, mild subacromial/subdeltioid bursitis, A/C joint arthropathy per MRI done in 3/07   Stroke (HCC) 2014   Tobacco abuse     Past Surgical History:  Procedure Laterality Date   ANTERIOR LAT LUMBAR FUSION N/A 12/18/2013   Procedure: Lumbar Two Anteriorlateral Corpectomy w/ cage, interbody fusion, anterior plating, Lumbar One to Lumbar Three percutaneous pedicle screws;  Surgeon: Victory DELENA Gunnels, MD;  Location: [facility] NEURO ORS;  Service: Neurosurgery;  Laterality: N/A;   AORTA -INNOMIATE BYPASS N/A 10/15/2014   Procedure: ENDARTERCTOMY OF  INNOMINATE ARTERY ;  Surgeon: Carlin FORBES Haddock, MD;  Location: [facility] OR;  Service: Vascular;  Laterality: N/A;   ARCH AORTOGRAM N/A 08/23/2014   Procedure: ARCH AORTOGRAM;  Surgeon: Carlin FORBES Haddock, MD;  Location: [facility] CATH LAB;   Service: Cardiovascular;  Laterality: N/A;   arch aortogram, left carotid,left subclavian angiogram  08/23/2014   BIOPSY  09/14/2023   Procedure: BIOPSY;  Surgeon: Jinny Carmine, MD;  Location: [facility] ENDOSCOPY;  Service: Endoscopy;;   CAROTID ANGIOGRAM Left 08/23/2014   Procedure: CAROTID GERALYN;  Surgeon: Carlin FORBES Haddock, MD;  Location: [facility] CATH LAB;  Service: Cardiovascular;  Laterality: Left;   CHOLECYSTECTOMY  12/2016   COLONOSCOPY WITH PROPOFOL  N/A 09/14/2023   Procedure: COLONOSCOPY WITH PROPOFOL ;  Surgeon: Jinny Carmine, MD;  Location: [facility] ENDOSCOPY;  Service: Endoscopy;  Laterality: N/A;   ENDARTERECTOMY Right 10/15/2014   Procedure: ENDARTERECTOMY RIGHT SUBCLAVIAN ARTERY;  Surgeon: Carlin FORBES Haddock, MD;  Location: [facility] OR;  Service: Vascular;  Laterality: Right;   ENDARTERECTOMY Right 10/15/2014   Procedure: ENDARTERECTOMY RIGHT COMMON CAROTID ARTERY;  Surgeon: Carlin FORBES Haddock, MD;  Location: [facility] OR;  Service: Vascular;  Laterality: Right;   ESOPHAGOGASTRODUODENOSCOPY (EGD) WITH PROPOFOL  N/A 09/14/2023   Procedure: ESOPHAGOGASTRODUODENOSCOPY (EGD) WITH PROPOFOL ;  Surgeon: Jinny Carmine, MD;  Location: [facility] ENDOSCOPY;  Service: Endoscopy;  Laterality: N/A;   ESOPHAGOGASTRODUODENOSCOPY (EGD) WITH PROPOFOL  N/A 11/24/2023   Procedure: ESOPHAGOGASTRODUODENOSCOPY (EGD) WITH PROPOFOL ;  Surgeon: Jinny Carmine, MD;  Location: [facility] ENDOSCOPY;  Service: Endoscopy;  Laterality: N/A;   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   HERNIA REPAIR     LEFT HEART CATH AND CORONARY  ANGIOGRAPHY N/A 03/17/2021   Procedure: LEFT HEART CATH AND CORONARY ANGIOGRAPHY;  Surgeon: Fernand Denyse LABOR, MD;  Location: [facility] INVASIVE CV LAB;  Service: Cardiovascular;  Laterality: N/A;   LEFT HEART CATHETERIZATION WITH CORONARY ANGIOGRAM N/A 09/12/2014   Procedure: LEFT HEART CATHETERIZATION WITH CORONARY ANGIOGRAM;  Surgeon: Maude JAYSON Emmer, MD;  Location: [facility] CATH LAB;  Service: Cardiovascular;  Laterality: N/A;   LUMBAR  SPINE SURGERY  12/18/2013   L2     DR POOL   NECK SURGERY     PATCH ANGIOPLASTY Right 10/15/2014   Procedure: PATCH ANGIOPLASTY OF RIGHT SUBCLAVIAN ARTERY , RIGHT COMMON CAROTID ARTERY & INNOMINATE ARTERY;  Surgeon: Carlin FORBES Haddock, MD;  Location: [facility] OR;  Service: Vascular;  Laterality: Right;   STERNOTOMY N/A 10/15/2014   Procedure: PARTIAL STERNOTOMY & PLATING OF STERNUM;  Surgeon: Carlin FORBES Haddock, MD;  Location: [facility] OR;  Service: Vascular;  Laterality: N/A;   TEE WITHOUT CARDIOVERSION N/A 12/25/2013   Procedure: TRANSESOPHAGEAL ECHOCARDIOGRAM (TEE);  Surgeon: Ezra GORMAN Shuck, MD;  Location: [name] ENDOSCOPY;  Service: Cardiovascular;  Laterality: N/A;    Allergies  Allergen Reactions   Bee Venom Swelling   Tapentadol Shortness Of Breath    Other Reaction(s): sob/tachycardia   Body fluid culture w Gram Stain Order: [id number]  Status: Final result     Next appt: 10/09/2024 at 10:15 AM in Podiatry [place] CHRISTELLA Sar, DPM)   Test Result Released: Yes (not seen)   Specimen Information: Tissue; Body Fluid  0 Result Notes    Component Ref Range & Units (hover) 8 d ago  Specimen Description TISSUE Performed at [facility], [address] [place], [place] [address]  Special Requests  RIGHT FOOT Performed at [facility], [address] [place], [place] [address]  Gram Stain RARE WBC PRESENT, PREDOMINANTLY PMN NO ORGANISMS SEEN  Culture NO GROWTH 3 DAYS Performed at [facility], [address] [place], [place] [address]  Report Status 09/10/2024 FINAL     Physical Exam: General: The patient is alert and oriented x3 in no acute distress.  Dermatology: Skin is warm, dry and supple bilateral lower extremities.  No open wound today  Vascular: Palpable pedal pulses bilaterally. Capillary refill within normal limits.  No significant erythema.  No edema.    Neurological: Grossly intact via light touch  Musculoskeletal Exam: Tenderness to palpation noted to  the first MTP of the right foot.  Hallux valgus deformity also noted  DG Foot Complete Right (Accession [id number]) (Order [id number]) Imaging Date: 09/06/2024 Department: [facility] GENERAL SURGERY Released By/Authorizing: Levander Slate, MD (auto-released)  IMPRESSION: 1. New intra-articular nondisplaced fracture of the medial base of the proximal phalanx of the right great toe. 2. Severe hallux valgus deformity is increased compared to the prior exam . 3. Mild first MTP joint osteoarthritis. 4. Calcaneal enthesopathy.  Assessment/Plan of Care: 1. Acute gout right foot 1st MTP  -Patient evaluated.  Chart reviewed -Injection of 0.5 cc Celestone Soluspan injected into the first MTP right -Prescription for colchicine  0.6 mg daily PRN -Recommend refraining from foods and diet that are high in purines -Currently on chronic pain management -Okay to transition out of the cam boot into good supportive tennis shoes once he feels well and the pain improves.  In the meantime continue WBAT cam boot -Return to clinic 4 weeks     Thresa EMERSON Sar, DPM [facility]  Dr. Thresa EMERSON Sar, DPM    [address] [place], [place] [address]                Office [phone]  Fax [phone]

## 2024-09-17 ENCOUNTER — Other Ambulatory Visit: Payer: Self-pay | Admitting: Gastroenterology

## 2024-09-23 ENCOUNTER — Other Ambulatory Visit: Payer: Self-pay

## 2024-09-23 ENCOUNTER — Emergency Department

## 2024-09-23 ENCOUNTER — Emergency Department
Admission: EM | Admit: 2024-09-23 | Discharge: 2024-09-23 | Disposition: A | Discharge status: Home / Self Care | Attending: Emergency Medicine | Admitting: Emergency Medicine

## 2024-09-23 DIAGNOSIS — I10 Essential (primary) hypertension: Secondary | ICD-10-CM | POA: Diagnosis not present

## 2024-09-23 DIAGNOSIS — J441 Chronic obstructive pulmonary disease with (acute) exacerbation: Secondary | ICD-10-CM | POA: Insufficient documentation

## 2024-09-23 DIAGNOSIS — R0602 Shortness of breath: Secondary | ICD-10-CM | POA: Diagnosis present

## 2024-09-23 LAB — COMPREHENSIVE METABOLIC PANEL WITH GFR
ALT: 10 U/L (ref 0–44)
AST: 22 U/L (ref 15–41)
Albumin: 3.9 g/dL (ref 3.5–5.0)
Alkaline Phosphatase: 82 U/L (ref 38–126)
Anion gap: 14 (ref 5–15)
BUN: 24 mg/dL — ABNORMAL HIGH (ref 8–23)
CO2: 26 mmol/L (ref 22–32)
Calcium: 9.5 mg/dL (ref 8.9–10.3)
Chloride: 93 mmol/L — ABNORMAL LOW (ref 98–111)
Creatinine, Ser: 1.54 mg/dL — ABNORMAL HIGH (ref 0.61–1.24)
GFR, Estimated: 49 mL/min — ABNORMAL LOW (ref >60)
Glucose, Bld: 77 mg/dL (ref 70–99)
Potassium: 3 mmol/L — ABNORMAL LOW (ref 3.5–5.1)
Sodium: 133 mmol/L — ABNORMAL LOW (ref 135–145)
Total Bilirubin: 0.6 mg/dL (ref 0.0–1.2)
Total Protein: 7.9 g/dL (ref 6.5–8.1)

## 2024-09-23 LAB — D-DIMER, QUANTITATIVE: D-Dimer, Quant: 1.55 ug{FEU}/mL — ABNORMAL HIGH (ref 0.00–0.50)

## 2024-09-23 LAB — CBC
HCT: 31.4 % — ABNORMAL LOW (ref 39.0–52.0)
Hemoglobin: 10.1 g/dL — ABNORMAL LOW (ref 13.0–17.0)
MCH: 26.2 pg (ref 26.0–34.0)
MCHC: 32.2 g/dL (ref 30.0–36.0)
MCV: 81.3 fL (ref 80.0–100.0)
Platelets: 270 K/uL (ref 150–400)
RBC: 3.86 MIL/uL — ABNORMAL LOW (ref 4.22–5.81)
RDW: 14.9 % (ref 11.5–15.5)
WBC: 8.4 K/uL (ref 4.0–10.5)
nRBC: 0 % (ref 0.0–0.2)

## 2024-09-23 LAB — TROPONIN I (HIGH SENSITIVITY)
Troponin I (High Sensitivity): 14 ng/L (ref <18)
Troponin I (High Sensitivity): 14 ng/L (ref <18)

## 2024-09-23 MED ORDER — IPRATROPIUM-ALBUTEROL 0.5-2.5 (3) MG/3ML IN SOLN
6.0000 mL | Freq: Once | RESPIRATORY_TRACT | Status: AC
  Administered 2024-09-23: 6 mL via RESPIRATORY_TRACT
  Filled 2024-09-23: qty 6

## 2024-09-23 MED ORDER — POTASSIUM CHLORIDE CRYS ER 20 MEQ PO TBCR
40.0000 meq | EXTENDED_RELEASE_TABLET | Freq: Once | ORAL | Status: AC
  Administered 2024-09-23: 40 meq via ORAL
  Filled 2024-09-23: qty 2

## 2024-09-23 MED ORDER — IOHEXOL 350 MG/ML SOLN
75.0000 mL | Freq: Once | INTRAVENOUS | Status: AC | PRN
  Administered 2024-09-23: 75 mL via INTRAVENOUS

## 2024-09-23 MED ORDER — METHYLPREDNISOLONE SODIUM SUCC 125 MG IJ SOLR
125.0000 mg | Freq: Once | INTRAMUSCULAR | Status: AC
  Administered 2024-09-23: 125 mg via INTRAVENOUS
  Filled 2024-09-23: qty 2

## 2024-09-23 MED ORDER — AMOXICILLIN-POT CLAVULANATE 875-125 MG PO TABS: 1.0000 | ORAL_TABLET | Freq: Two times a day (BID) | ORAL | 0 refills | Status: AC

## 2024-09-23 MED ORDER — PREDNISONE 20 MG PO TABS: 40.0000 mg | ORAL_TABLET | Freq: Every day | ORAL | 0 refills | Status: AC

## 2024-09-23 NOTE — ED Triage Notes (Signed)
 Pt to ED for SOB since 2 days, hx COPD and wears 3-4L at baseline. Currently on 3L. Pt was also recently taken off one of his BP meds and placed on colchicine  for gout.

## 2024-09-23 NOTE — Discharge Instructions (Signed)
 You were seen in the emergency department today for evaluation of your shortness of breath.  I suspect this is related to an exacerbation of your COPD.  I sent a course of steroids as well as antibiotics to your pharmacy.  Continue to take your inhalers as directed.  Return to the ER for new or worsening symptoms.

## 2024-09-23 NOTE — ED Provider Notes (Signed)
 [facility] Provider Note    Event Date/Time   First MD Initiated Contact with Patient 09/23/24 0957     (approximate)   History   Shortness of Breath   HPI  Bradley Sawyer is a 68 year old male with history of COPD, hypertension, CVA presenting to the emergency department for evaluation of shortness of breath.Patient reports worsening shortness of breath over the past couple of days.  Reports he has been using his inhalers as directed.  Use 3 to 4 L of oxygen at baseline.  Reports significant shortness of breath with exertion.  Denies chest pain.  Reviewed recent discharge summary from 09/07/2024.  At that time patient presented with lightheadedness as well as right foot swelling related to gout.     Physical Exam   Triage Vital Signs: ED Triage Vitals  Encounter Vitals Group     BP 09/23/24 0952 (!) 161/120     Girls Systolic BP Percentile --      Girls Diastolic BP Percentile --      Boys Systolic BP Percentile --      Boys Diastolic BP Percentile --      Pulse Rate 09/23/24 0952 (!) 106     Resp 09/23/24 0952 (!) 22     Temp 09/23/24 0952 98.2 F (36.8 C)     Temp Source 09/23/24 1349 Oral     SpO2 09/23/24 0952 100 %     Weight 09/23/24 0953 182 lb 15.7 oz (83 kg)     Height 09/23/24 0953 5' 11 (1.803 m)     Head Circumference --      Peak Flow --      Pain Score 09/23/24 0950 7     Pain Loc --      Pain Education --      Exclude from Growth Chart --     Most recent vital signs: Vitals:   09/23/24 1330 09/23/24 1349  BP: 103/74   Pulse: 77   Resp: 20   Temp:  98.3 F (36.8 C)  SpO2: 97%      General: Awake, interactive  CV:  Good peripheral perfusion Resp:  Mildly labored respirations, lung sounds coarse bilaterally with diffuse wheezing, fair air movement Abd:  Nondistended.  Neuro:  Symmetric facial movement, fluid speech   ED Results / Procedures / Treatments   Labs (all labs ordered are listed, but only  abnormal results are displayed) Labs Reviewed  CBC - Abnormal; Notable for the following components:      Result Value   RBC 3.86 (*)    Hemoglobin 10.1 (*)    HCT 31.4 (*)    All other components within normal limits  D-DIMER, QUANTITATIVE - Abnormal; Notable for the following components:   D-Dimer, Quant 1.55 (*)    All other components within normal limits  COMPREHENSIVE METABOLIC PANEL WITH GFR - Abnormal; Notable for the following components:   Sodium 133 (*)    Potassium 3.0 (*)    Chloride 93 (*)    BUN 24 (*)    Creatinine, Ser 1.54 (*)    GFR, Estimated 49 (*)    All other components within normal limits  TROPONIN I (HIGH SENSITIVITY)  TROPONIN I (HIGH SENSITIVITY)     EKG EKG independently reviewed and interpreted by myself demonstrates:  EKG demonstrate sinus tachycardia at a rate of 104, PR 184, QRS 68, QTc 483, no acute ST changes  RADIOLOGY Imaging independently reviewed and interpreted by myself demonstrates:  CXR without focal consolidation CTA of the chest without evidence of PE, mucous plugging or aspiration noted  Formal Radiology Read:  CT Angio Chest PE W and/or Wo Contrast Result Date: 09/23/2024 CLINICAL DATA:  Shortness of breath. Oxygen dependent. Positive D-dimer. Evaluate for pulmonary embolism. EXAM: CT ANGIOGRAPHY CHEST WITH CONTRAST TECHNIQUE: Multidetector CT imaging of the chest was performed using the standard protocol during bolus administration of intravenous contrast. Multiplanar CT image reconstructions and MIPs were obtained to evaluate the vascular anatomy. RADIATION DOSE REDUCTION: This exam was performed according to the departmental dose-optimization program which includes automated exposure control, adjustment of the mA and/or kV according to patient size and/or use of iterative reconstruction technique. CONTRAST:  75mL OMNIPAQUE  IOHEXOL  350 MG/ML SOLN COMPARISON:  03/01/2021 FINDINGS: Cardiovascular: Median sternotomy wires are present.  Heart is normal size. Calcified plaque over the left main and 3 vessel coronary arteries. Thoracic aorta is normal in caliber. Mild calcified plaque over the descending thoracic aorta. Pulmonary arterial system is well opacified without evidence of emboli. Remaining vascular structures are unremarkable. Mediastinum/Nodes: No mediastinal or hilar adenopathy. Remaining mediastinal structures are normal. Lungs/Pleura: Lungs are adequately inflated. No acute airspace process or effusion. Right minimal peripheral interstitial prominence over the right apex. Trachea and mainstem bronchi are normal. There is opacification of scaring the proximal right lower lobar bronchi as well as mild material filling a posterior basilar left lower lobe bronchus. Findings may be due to mucous plugging/aspiration. Upper Abdomen: Calcified plaque over the abdominal aorta. Prior cholecystectomy. No acute findings. Musculoskeletal: No focal abnormality. Partially visualized posterior fusion hardware over the thoracolumbar junction. Review of the MIP images confirms the above findings. IMPRESSION: 1. No evidence of pulmonary embolism. 2. Opacification bilateral lower lobe bronchi. Findings may be due to mucous plugging/aspiration. 3. Aortic atherosclerosis. Atherosclerotic coronary artery disease. Aortic Atherosclerosis (ICD10-I70.0). Electronically Signed   By: Toribio Agreste M.D.   On: 09/23/2024 12:46   DG Chest 2 View Result Date: 09/23/2024 EXAM: 2 VIEW(S) XRAY OF THE CHEST 09/23/2024 10:19:04 AM COMPARISON: 08/26/2023 CLINICAL HISTORY: SOB SOB FINDINGS: LUNGS AND PLEURA: No focal pulmonary opacity. No pulmonary edema. No pleural effusion. No pneumothorax. HEART AND MEDIASTINUM: No acute abnormality of the cardiac and mediastinal silhouettes. BONES AND SOFT TISSUES: No acute osseous abnormality. IMPRESSION: 1. No acute cardiopulmonary process detected. Electronically signed by: Lynwood Seip MD 09/23/2024 10:22 AM EST RP Workstation:  [id number]    PROCEDURES:  Critical Care performed: No  Procedures   MEDICATIONS ORDERED IN ED: Medications  methylPREDNISolone  sodium succinate (SOLU-MEDROL ) 125 mg/2 mL injection 125 mg (125 mg Intravenous Given 09/23/24 1031)  ipratropium-albuterol  (DUONEB) 0.5-2.5 (3) MG/3ML nebulizer solution 6 mL (6 mLs Nebulization Given 09/23/24 1034)  iohexol  (OMNIPAQUE ) 350 MG/ML injection 75 mL (75 mLs Intravenous Contrast Given 09/23/24 1221)  ipratropium-albuterol  (DUONEB) 0.5-2.5 (3) MG/3ML nebulizer solution 6 mL (6 mLs Nebulization Given 09/23/24 1327)  potassium chloride  SA (KLOR-CON  M) CR tablet 40 mEq (40 mEq Oral Given 09/23/24 1347)     IMPRESSION / MDM / ASSESSMENT AND PLAN / ED COURSE  I reviewed the triage vital signs and the nursing notes.  Differential diagnosis includes, but is not limited to, COPD exacerbation, pneumonia, viral illness, anemia, electrolyte abnormality, ACS, PE  Patient's presentation is most consistent with acute presentation with potential threat to life or bodily function.  68 year old male presenting to the emergency department for evaluation of shortness of breath.  CBC with normal white blood cell count, stable anemia.  CMP with mild hypokalemia, orally  repleted.  Mild renal impairment.  Negative troponin x 2.  D-dimer elevated at 1.55.  Chest x-Neera Teng and CTA of the chest without pneumonia or PE.  Patient treated symptomatically with steroids, neb treatments.  Did report significant improvement on reevaluation.  Significantly improved air movement and near resolution of wheezing on reevaluation.  Did consider admission given patient's initial presentation, but he reports feeling significantly improved and would prefer to be discharged home.  Do think this is reasonable.  Will discharge with steroid course as well as Augmentin  in the setting of sputum change.  Strict return precautions provided.  Patient discharged in stable condition.      FINAL CLINICAL  IMPRESSION(S) / ED DIAGNOSES   Final diagnoses:  COPD exacerbation (HCC)     Rx / DC Orders   ED Discharge Orders          Ordered    amoxicillin -clavulanate (AUGMENTIN ) 875-125 MG tablet  2 times daily        09/23/24 1354    predniSONE  (DELTASONE ) 20 MG tablet  Daily with breakfast        09/23/24 1354             Note:  This document was prepared using Dragon voice recognition software and may include unintentional dictation errors.   Levander Slate, MD 09/23/24 1355

## 2024-10-03 ENCOUNTER — Telehealth: Payer: Self-pay

## 2024-10-03 DIAGNOSIS — J9611 Chronic respiratory failure with hypoxia: Secondary | ICD-10-CM

## 2024-10-03 DIAGNOSIS — J449 Chronic obstructive pulmonary disease, unspecified: Secondary | ICD-10-CM

## 2024-10-03 NOTE — Telephone Encounter (Signed)
 Noted. Nothing further needed.

## 2024-10-03 NOTE — Telephone Encounter (Signed)
 I spoke with the patient. I told him [employer] will need to walk him on a POC to see how many liters Pulse Oxygen he will need. He is okay doing that in order to get a POC.  Dr. Tamea- okay to place an order for a POC and walk test with [employer]?

## 2024-10-03 NOTE — Telephone Encounter (Addendum)
 Walk test request submitted to [employer].

## 2024-10-03 NOTE — Telephone Encounter (Signed)
 Copied from CRM [id number]. Topic: Clinical - Order For Equipment >> Oct 03, 2024 11:47 AM Rilla B wrote: Patient would like to talk to Dr Lenda nurse. Patient would like a portable oxygen that weights about 4-5 pounds. What he has is too heavy and he barely walk with it. He called [employer] and they would like patient to have a walking test. He called [employer] and they said all he needs is an order.  Please call patient @ [phone].

## 2024-10-03 NOTE — Telephone Encounter (Signed)
Please go ahead and place the order

## 2024-10-04 NOTE — Addendum Note (Signed)
 Addended by: Jazalynn Mireles J on: 10/04/2024 03:20 PM   Modules accepted: Orders

## 2024-10-04 NOTE — Addendum Note (Signed)
 Addended by: Makayela Secrest J on: 10/04/2024 11:50 AM   Modules accepted: Orders

## 2024-10-09 ENCOUNTER — Ambulatory Visit: Admitting: Podiatry

## 2024-10-11 ENCOUNTER — Encounter: Payer: Self-pay | Admitting: Emergency Medicine

## 2024-10-11 ENCOUNTER — Other Ambulatory Visit: Payer: Self-pay

## 2024-10-11 ENCOUNTER — Emergency Department
Admission: EM | Admit: 2024-10-11 | Discharge: 2024-10-11 | Disposition: A | Discharge status: Home / Self Care | Source: Home / Self Care | Attending: Emergency Medicine | Admitting: Emergency Medicine

## 2024-10-11 DIAGNOSIS — J449 Chronic obstructive pulmonary disease, unspecified: Secondary | ICD-10-CM | POA: Insufficient documentation

## 2024-10-11 DIAGNOSIS — I10 Essential (primary) hypertension: Secondary | ICD-10-CM | POA: Insufficient documentation

## 2024-10-11 DIAGNOSIS — A419 Sepsis, unspecified organism: Secondary | ICD-10-CM | POA: Diagnosis not present

## 2024-10-11 DIAGNOSIS — M1000 Idiopathic gout, unspecified site: Secondary | ICD-10-CM | POA: Insufficient documentation

## 2024-10-11 DIAGNOSIS — D72829 Elevated white blood cell count, unspecified: Secondary | ICD-10-CM | POA: Insufficient documentation

## 2024-10-11 DIAGNOSIS — E876 Hypokalemia: Secondary | ICD-10-CM | POA: Insufficient documentation

## 2024-10-11 DIAGNOSIS — R531 Weakness: Secondary | ICD-10-CM | POA: Diagnosis not present

## 2024-10-11 LAB — CBC WITH DIFFERENTIAL/PLATELET
Abs Immature Granulocytes: 0.04 K/uL (ref 0.00–0.07)
Basophils Absolute: 0 K/uL (ref 0.0–0.1)
Basophils Relative: 0 %
Eosinophils Absolute: 0.1 K/uL (ref 0.0–0.5)
Eosinophils Relative: 1 %
HCT: 30.9 % — ABNORMAL LOW (ref 39.0–52.0)
Hemoglobin: 9.6 g/dL — ABNORMAL LOW (ref 13.0–17.0)
Immature Granulocytes: 0 %
Lymphocytes Relative: 16 %
Lymphs Abs: 1.9 K/uL (ref 0.7–4.0)
MCH: 26.4 pg (ref 26.0–34.0)
MCHC: 31.1 g/dL (ref 30.0–36.0)
MCV: 84.9 fL (ref 80.0–100.0)
Monocytes Absolute: 1.1 K/uL — ABNORMAL HIGH (ref 0.1–1.0)
Monocytes Relative: 9 %
Neutro Abs: 8.6 K/uL — ABNORMAL HIGH (ref 1.7–7.7)
Neutrophils Relative %: 74 %
Platelets: 240 K/uL (ref 150–400)
RBC: 3.64 MIL/uL — ABNORMAL LOW (ref 4.22–5.81)
RDW: 17 % — ABNORMAL HIGH (ref 11.5–15.5)
WBC: 11.8 K/uL — ABNORMAL HIGH (ref 4.0–10.5)
nRBC: 0 % (ref 0.0–0.2)

## 2024-10-11 LAB — COMPREHENSIVE METABOLIC PANEL WITH GFR
ALT: 23 U/L (ref 0–44)
AST: 27 U/L (ref 15–41)
Albumin: 4 g/dL (ref 3.5–5.0)
Alkaline Phosphatase: 145 U/L — ABNORMAL HIGH (ref 38–126)
Anion gap: 10 (ref 5–15)
BUN: 9 mg/dL (ref 8–23)
CO2: 35 mmol/L — ABNORMAL HIGH (ref 22–32)
Calcium: 9 mg/dL (ref 8.9–10.3)
Chloride: 93 mmol/L — ABNORMAL LOW (ref 98–111)
Creatinine, Ser: 1.06 mg/dL (ref 0.61–1.24)
GFR, Estimated: >60 mL/min (ref >60)
Glucose, Bld: 106 mg/dL — ABNORMAL HIGH (ref 70–99)
Potassium: 2.6 mmol/L — CL (ref 3.5–5.1)
Sodium: 138 mmol/L (ref 135–145)
Total Bilirubin: 0.8 mg/dL (ref 0.0–1.2)
Total Protein: 7.4 g/dL (ref 6.5–8.1)

## 2024-10-11 LAB — URIC ACID: Uric Acid, Serum: 7.8 mg/dL (ref 3.7–8.6)

## 2024-10-11 MED ORDER — PREDNISONE 10 MG (21) PO TBPK: ORAL_TABLET | ORAL | 0 refills | Status: DC

## 2024-10-11 MED ORDER — TRAMADOL HCL 50 MG PO TABS: 50.0000 mg | ORAL_TABLET | Freq: Four times a day (QID) | ORAL | 0 refills | Status: DC | PRN

## 2024-10-11 MED ORDER — ONDANSETRON HCL 4 MG/2ML IJ SOLN
4.0000 mg | Freq: Once | INTRAMUSCULAR | Status: AC
  Administered 2024-10-11: 4 mg via INTRAVENOUS
  Filled 2024-10-11: qty 2

## 2024-10-11 MED ORDER — POTASSIUM CHLORIDE 20 MEQ PO PACK
60.0000 meq | PACK | Freq: Once | ORAL | Status: AC
Start: 2024-10-11 — End: 2024-10-11
  Administered 2024-10-11: 60 meq via ORAL
  Filled 2024-10-11: qty 3

## 2024-10-11 MED ORDER — MORPHINE SULFATE (PF) 4 MG/ML IV SOLN
4.0000 mg | Freq: Once | INTRAVENOUS | Status: AC
  Administered 2024-10-11: 4 mg via INTRAVENOUS
  Filled 2024-10-11: qty 1

## 2024-10-11 MED ORDER — POTASSIUM CHLORIDE CRYS ER 20 MEQ PO TBCR: 20.0000 meq | EXTENDED_RELEASE_TABLET | Freq: Two times a day (BID) | ORAL | 0 refills | Status: DC

## 2024-10-11 NOTE — ED Notes (Signed)
 Potassium reported  2.6

## 2024-10-11 NOTE — ED Triage Notes (Addendum)
 Pt to ER with c/o foot pain and swelling in same foot that was treated for infection recently.  Pt also reports changes to medications and feeling more lethargic than normal. No notes signed of open wounds or infection.

## 2024-10-11 NOTE — ED Provider Notes (Signed)
 [facility] Provider Note    Event Date/Time   First MD Initiated Contact with Patient 10/11/24 1152     (approximate)   History   Wound Infection   HPI  Bradley Sawyer is a 68 y.o. male history of hyperlipidemia, hypertension, COPD, CVA, and multiple other chronic medical problems, see past medical history presents emergency department complaining of right foot pain.  States been here several times for the same.  States got more red and swollen.  Did call his daughter-in-law to discuss with her.  States that he stopped blood pressure medication at the advice of the ER doctor because blood pressure was dropping too low.  Now she states he really does not look well and even her children were stating that he does not look well.  States he has been more weak while at home.      Physical Exam   Triage Vital Signs: ED Triage Vitals [10/11/24 1134]  Encounter Vitals Group     BP 123/72     Girls Systolic BP Percentile      Girls Diastolic BP Percentile      Boys Systolic BP Percentile      Boys Diastolic BP Percentile      Pulse Rate 94     Resp 20     Temp 98.4 F (36.9 C)     Temp src      SpO2 99 %     Weight 182 lb 15.7 oz (83 kg)     Height 5' 11 (1.803 m)     Head Circumference      Peak Flow      Pain Score 9     Pain Loc      Pain Education      Exclude from Growth Chart     Most recent vital signs: Vitals:   10/11/24 1134  BP: 123/72  Pulse: 94  Resp: 20  Temp: 98.4 F (36.9 C)  SpO2: 99%     General: Awake, no distress.   CV:  Good peripheral perfusion.  Resp:  Normal effort.  Abd:  No distention.   Other:  Patient is pale, right foot is bright red and swollen especially around the ankle, very tender to palpation, does have a bunion on the right foot that is red and swollen but appears to be chronic.   ED Results / Procedures / Treatments   Labs (all labs ordered are listed, but only abnormal results are  displayed) Labs Reviewed  COMPREHENSIVE METABOLIC PANEL WITH GFR - Abnormal; Notable for the following components:      Result Value   Potassium 2.6 (*)    Chloride 93 (*)    CO2 35 (*)    Glucose, Bld 106 (*)    Alkaline Phosphatase 145 (*)    All other components within normal limits  CBC WITH DIFFERENTIAL/PLATELET - Abnormal; Notable for the following components:   WBC 11.8 (*)    RBC 3.64 (*)    Hemoglobin 9.6 (*)    HCT 30.9 (*)    RDW 17.0 (*)    Neutro Abs 8.6 (*)    Monocytes Absolute 1.1 (*)    All other components within normal limits  URIC ACID     EKG     RADIOLOGY     PROCEDURES:   Procedures  Critical Care:  no Chief Complaint  Patient presents with   Wound Infection      MEDICATIONS ORDERED IN ED:  Medications  morphine  (PF) 4 MG/ML injection 4 mg (4 mg Intravenous Given 10/11/24 1343)  ondansetron  (ZOFRAN ) injection 4 mg (4 mg Intravenous Given 10/11/24 1343)  potassium chloride  (KLOR-CON ) packet 60 mEq (60 mEq Oral Given 10/11/24 1434)     IMPRESSION / MDM / ASSESSMENT AND PLAN / ED COURSE  I reviewed the triage vital signs and the nursing notes.                              Differential diagnosis includes, but is not limited to, weakness, sepsis, anemia, cellulitis, osteomyelitis, gout  Patient's presentation is most consistent with acute illness / injury with system symptoms.   Labs ordered  Patient's white count slightly elevated 11.8, no elevated 6, however in reviewing the patient's old charts and labs I feel this is in his normal trend.  His potassium is decreased at 2.6, will give him potassium here in the ED.  Outpatient order for potassium.  At this time I do not feel he has a septic joint.  Feel it is more of a gout that is not being controlled by his colchicine .  As far as weakness I think the lower potassium was causing some of his weakness.  We can correct this with outpatient therapy.  However he was given very strict  instructions to return to the emergency department if he has increasing weakness, redness and swelling of the foot, or develops fever.  He is in agreement with this treatment plan.  He is discharged stable.      FINAL CLINICAL IMPRESSION(S) / ED DIAGNOSES   Final diagnoses:  Hypokalemia  Idiopathic gout, unspecified chronicity, unspecified site     Rx / DC Orders   ED Discharge Orders          Ordered    predniSONE  (STERAPRED UNI-PAK 21 TAB) 10 MG (21) TBPK tablet        10/11/24 1529    potassium chloride  SA (KLOR-CON  M) 20 MEQ tablet  2 times daily        10/11/24 1529    traMADol  (ULTRAM ) 50 MG tablet  Every 6 hours PRN        10/11/24 1529             Note:  This document was prepared using Dragon voice recognition software and may include unintentional dictation errors.    Gasper Devere ORN, PA-C 10/11/24 1644    Arlander Charleston, MD 10/11/24 [phone]

## 2024-10-11 NOTE — Discharge Instructions (Signed)
 Your potassium is low today.  Please take the potassium starting tomorrow twice daily.  Recheck your potassium with your regular doctor around the beginning of the week.  For the ankle Sterapred will help with gout.  Please continue your colchicine .  Also tramadol for pain.  May take Tylenol  also.

## 2024-10-12 ENCOUNTER — Emergency Department

## 2024-10-12 ENCOUNTER — Inpatient Hospital Stay
Admission: EM | Admit: 2024-10-12 | Discharge: 2024-10-17 | DRG: 871 | Disposition: A | Discharge status: Skilled Nursing Facility | Attending: Internal Medicine | Admitting: Internal Medicine

## 2024-10-12 ENCOUNTER — Other Ambulatory Visit: Payer: Self-pay

## 2024-10-12 DIAGNOSIS — Z8249 Family history of ischemic heart disease and other diseases of the circulatory system: Secondary | ICD-10-CM

## 2024-10-12 DIAGNOSIS — Z7951 Long term (current) use of inhaled steroids: Secondary | ICD-10-CM

## 2024-10-12 DIAGNOSIS — I10 Essential (primary) hypertension: Secondary | ICD-10-CM | POA: Diagnosis present

## 2024-10-12 DIAGNOSIS — N179 Acute kidney failure, unspecified: Secondary | ICD-10-CM | POA: Diagnosis present

## 2024-10-12 DIAGNOSIS — K760 Fatty (change of) liver, not elsewhere classified: Secondary | ICD-10-CM | POA: Diagnosis present

## 2024-10-12 DIAGNOSIS — J440 Chronic obstructive pulmonary disease with acute lower respiratory infection: Secondary | ICD-10-CM | POA: Diagnosis present

## 2024-10-12 DIAGNOSIS — Z7902 Long term (current) use of antithrombotics/antiplatelets: Secondary | ICD-10-CM

## 2024-10-12 DIAGNOSIS — I959 Hypotension, unspecified: Principal | ICD-10-CM | POA: Diagnosis present

## 2024-10-12 DIAGNOSIS — R296 Repeated falls: Secondary | ICD-10-CM | POA: Diagnosis present

## 2024-10-12 DIAGNOSIS — R6520 Severe sepsis without septic shock: Secondary | ICD-10-CM | POA: Diagnosis present

## 2024-10-12 DIAGNOSIS — K219 Gastro-esophageal reflux disease without esophagitis: Secondary | ICD-10-CM | POA: Diagnosis present

## 2024-10-12 DIAGNOSIS — N400 Enlarged prostate without lower urinary tract symptoms: Secondary | ICD-10-CM | POA: Diagnosis present

## 2024-10-12 DIAGNOSIS — I2510 Atherosclerotic heart disease of native coronary artery without angina pectoris: Secondary | ICD-10-CM | POA: Diagnosis present

## 2024-10-12 DIAGNOSIS — F17200 Nicotine dependence, unspecified, uncomplicated: Secondary | ICD-10-CM | POA: Diagnosis not present

## 2024-10-12 DIAGNOSIS — Z833 Family history of diabetes mellitus: Secondary | ICD-10-CM

## 2024-10-12 DIAGNOSIS — G8929 Other chronic pain: Secondary | ICD-10-CM | POA: Diagnosis present

## 2024-10-12 DIAGNOSIS — Z9981 Dependence on supplemental oxygen: Secondary | ICD-10-CM | POA: Diagnosis not present

## 2024-10-12 DIAGNOSIS — Z8673 Personal history of transient ischemic attack (TIA), and cerebral infarction without residual deficits: Secondary | ICD-10-CM | POA: Diagnosis not present

## 2024-10-12 DIAGNOSIS — D509 Iron deficiency anemia, unspecified: Secondary | ICD-10-CM | POA: Diagnosis present

## 2024-10-12 DIAGNOSIS — A419 Sepsis, unspecified organism: Secondary | ICD-10-CM | POA: Diagnosis present

## 2024-10-12 DIAGNOSIS — R531 Weakness: Secondary | ICD-10-CM | POA: Diagnosis present

## 2024-10-12 DIAGNOSIS — J159 Unspecified bacterial pneumonia: Secondary | ICD-10-CM | POA: Diagnosis present

## 2024-10-12 DIAGNOSIS — Z7982 Long term (current) use of aspirin: Secondary | ICD-10-CM | POA: Diagnosis not present

## 2024-10-12 DIAGNOSIS — J42 Unspecified chronic bronchitis: Secondary | ICD-10-CM | POA: Diagnosis not present

## 2024-10-12 DIAGNOSIS — J9611 Chronic respiratory failure with hypoxia: Secondary | ICD-10-CM | POA: Diagnosis present

## 2024-10-12 DIAGNOSIS — I2583 Coronary atherosclerosis due to lipid rich plaque: Secondary | ICD-10-CM | POA: Diagnosis not present

## 2024-10-12 DIAGNOSIS — Z87891 Personal history of nicotine dependence: Secondary | ICD-10-CM

## 2024-10-12 DIAGNOSIS — E785 Hyperlipidemia, unspecified: Secondary | ICD-10-CM | POA: Diagnosis present

## 2024-10-12 DIAGNOSIS — M1000 Idiopathic gout, unspecified site: Secondary | ICD-10-CM | POA: Diagnosis present

## 2024-10-12 DIAGNOSIS — Z91030 Bee allergy status: Secondary | ICD-10-CM

## 2024-10-12 DIAGNOSIS — Z7984 Long term (current) use of oral hypoglycemic drugs: Secondary | ICD-10-CM

## 2024-10-12 DIAGNOSIS — Z79891 Long term (current) use of opiate analgesic: Secondary | ICD-10-CM

## 2024-10-12 DIAGNOSIS — Z79899 Other long term (current) drug therapy: Secondary | ICD-10-CM

## 2024-10-12 DIAGNOSIS — M549 Dorsalgia, unspecified: Secondary | ICD-10-CM | POA: Diagnosis present

## 2024-10-12 DIAGNOSIS — E876 Hypokalemia: Secondary | ICD-10-CM | POA: Diagnosis present

## 2024-10-12 DIAGNOSIS — J189 Pneumonia, unspecified organism: Secondary | ICD-10-CM | POA: Diagnosis not present

## 2024-10-12 DIAGNOSIS — J449 Chronic obstructive pulmonary disease, unspecified: Secondary | ICD-10-CM | POA: Diagnosis present

## 2024-10-12 DIAGNOSIS — F419 Anxiety disorder, unspecified: Secondary | ICD-10-CM | POA: Diagnosis present

## 2024-10-12 DIAGNOSIS — M1990 Unspecified osteoarthritis, unspecified site: Secondary | ICD-10-CM | POA: Diagnosis present

## 2024-10-12 DIAGNOSIS — I739 Peripheral vascular disease, unspecified: Secondary | ICD-10-CM | POA: Diagnosis present

## 2024-10-12 DIAGNOSIS — Z9049 Acquired absence of other specified parts of digestive tract: Secondary | ICD-10-CM

## 2024-10-12 DIAGNOSIS — Z885 Allergy status to narcotic agent status: Secondary | ICD-10-CM

## 2024-10-12 LAB — CBC WITH DIFFERENTIAL/PLATELET
Abs Immature Granulocytes: 0.05 K/uL (ref 0.00–0.07)
Basophils Absolute: 0 K/uL (ref 0.0–0.1)
Basophils Relative: 0 %
Eosinophils Absolute: 0.1 K/uL (ref 0.0–0.5)
Eosinophils Relative: 1 %
HCT: 27.6 % — ABNORMAL LOW (ref 39.0–52.0)
Hemoglobin: 8.5 g/dL — ABNORMAL LOW (ref 13.0–17.0)
Immature Granulocytes: 1 %
Lymphocytes Relative: 18 %
Lymphs Abs: 1.8 K/uL (ref 0.7–4.0)
MCH: 26.4 pg (ref 26.0–34.0)
MCHC: 30.8 g/dL (ref 30.0–36.0)
MCV: 85.7 fL (ref 80.0–100.0)
Monocytes Absolute: 0.9 K/uL (ref 0.1–1.0)
Monocytes Relative: 9 %
Neutro Abs: 7 K/uL (ref 1.7–7.7)
Neutrophils Relative %: 71 %
Platelets: 244 K/uL (ref 150–400)
RBC: 3.22 MIL/uL — ABNORMAL LOW (ref 4.22–5.81)
RDW: 16.5 % — ABNORMAL HIGH (ref 11.5–15.5)
WBC: 9.8 K/uL (ref 4.0–10.5)
nRBC: 0 % (ref 0.0–0.2)

## 2024-10-12 LAB — COMPREHENSIVE METABOLIC PANEL WITH GFR
ALT: 20 U/L (ref 0–44)
AST: 37 U/L (ref 15–41)
Albumin: 3.5 g/dL (ref 3.5–5.0)
Alkaline Phosphatase: 113 U/L (ref 38–126)
Anion gap: 12 (ref 5–15)
BUN: 18 mg/dL (ref 8–23)
CO2: 34 mmol/L — ABNORMAL HIGH (ref 22–32)
Calcium: 8.9 mg/dL (ref 8.9–10.3)
Chloride: 90 mmol/L — ABNORMAL LOW (ref 98–111)
Creatinine, Ser: 1.65 mg/dL — ABNORMAL HIGH (ref 0.61–1.24)
GFR, Estimated: 45 mL/min — ABNORMAL LOW (ref >60)
Glucose, Bld: 106 mg/dL — ABNORMAL HIGH (ref 70–99)
Potassium: 2.7 mmol/L — CL (ref 3.5–5.1)
Sodium: 136 mmol/L (ref 135–145)
Total Bilirubin: 0.5 mg/dL (ref 0.0–1.2)
Total Protein: 6.8 g/dL (ref 6.5–8.1)

## 2024-10-12 LAB — URINALYSIS, W/ REFLEX TO CULTURE (INFECTION SUSPECTED)
Bilirubin Urine: NEGATIVE
Glucose, UA: NEGATIVE mg/dL
Ketones, ur: NEGATIVE mg/dL
Leukocytes,Ua: NEGATIVE
Nitrite: NEGATIVE
Protein, ur: NEGATIVE mg/dL
Specific Gravity, Urine: 1.009 (ref 1.005–1.030)
pH: 5 (ref 5.0–8.0)

## 2024-10-12 LAB — RESP PANEL BY RT-PCR (RSV, FLU A&B, COVID)  RVPGX2
Influenza A by PCR: NEGATIVE
Influenza B by PCR: NEGATIVE
Resp Syncytial Virus by PCR: NEGATIVE
SARS Coronavirus 2 by RT PCR: NEGATIVE

## 2024-10-12 LAB — PROTIME-INR
INR: 1.3 — ABNORMAL HIGH (ref 0.8–1.2)
Prothrombin Time: 16.4 s — ABNORMAL HIGH (ref 11.4–15.2)

## 2024-10-12 LAB — MAGNESIUM: Magnesium: 1.7 mg/dL (ref 1.7–2.4)

## 2024-10-12 LAB — BLOOD GAS, VENOUS: Patient temperature: 37

## 2024-10-12 LAB — LACTIC ACID, PLASMA: Lactic Acid, Venous: 1.4 mmol/L (ref 0.5–1.9)

## 2024-10-12 MED ORDER — POLYETHYLENE GLYCOL 3350 17 G PO PACK: 17.0000 g | PACK | Freq: Every day | ORAL | Status: DC | PRN

## 2024-10-12 MED ORDER — IPRATROPIUM-ALBUTEROL 0.5-2.5 (3) MG/3ML IN SOLN: 3.0000 mL | Freq: Four times a day (QID) | RESPIRATORY_TRACT | Status: DC | PRN

## 2024-10-12 MED ORDER — TAMSULOSIN HCL 0.4 MG PO CAPS
0.4000 mg | ORAL_CAPSULE | Freq: Every day | ORAL | Status: DC
  Administered 2024-10-13 – 2024-10-17 (×5): 0.4 mg via ORAL
  Filled 2024-10-12 (×5): qty 1

## 2024-10-12 MED ORDER — PANTOPRAZOLE SODIUM 40 MG PO TBEC
40.0000 mg | DELAYED_RELEASE_TABLET | Freq: Two times a day (BID) | ORAL | Status: DC
  Administered 2024-10-13 – 2024-10-17 (×10): 40 mg via ORAL
  Filled 2024-10-12 (×10): qty 1

## 2024-10-12 MED ORDER — OXYCODONE HCL 5 MG PO TABS
15.0000 mg | ORAL_TABLET | ORAL | Status: DC
  Administered 2024-10-13 – 2024-10-17 (×22): 15 mg via ORAL
  Filled 2024-10-12 (×22): qty 3

## 2024-10-12 MED ORDER — METRONIDAZOLE 500 MG/100ML IV SOLN
500.0000 mg | Freq: Once | INTRAVENOUS | Status: AC
  Administered 2024-10-12: 500 mg via INTRAVENOUS
  Filled 2024-10-12: qty 100

## 2024-10-12 MED ORDER — LACTATED RINGERS IV SOLN: INTRAVENOUS | Status: DC

## 2024-10-12 MED ORDER — IPRATROPIUM-ALBUTEROL 0.5-2.5 (3) MG/3ML IN SOLN
6.0000 mL | Freq: Once | RESPIRATORY_TRACT | Status: AC
  Administered 2024-10-12: 6 mL via RESPIRATORY_TRACT
  Filled 2024-10-12: qty 6

## 2024-10-12 MED ORDER — CYCLOBENZAPRINE HCL 10 MG PO TABS
10.0000 mg | ORAL_TABLET | Freq: Two times a day (BID) | ORAL | Status: DC | PRN
Start: 2024-10-12 — End: 2024-10-17

## 2024-10-12 MED ORDER — ONDANSETRON HCL 4 MG PO TABS
4.0000 mg | ORAL_TABLET | Freq: Three times a day (TID) | ORAL | Status: DC | PRN
Start: 2024-10-12 — End: 2024-10-17

## 2024-10-12 MED ORDER — NITROGLYCERIN 0.4 MG SL SUBL: 0.4000 mg | SUBLINGUAL_TABLET | SUBLINGUAL | Status: DC | PRN

## 2024-10-12 MED ORDER — POTASSIUM CHLORIDE CRYS ER 20 MEQ PO TBCR
20.0000 meq | EXTENDED_RELEASE_TABLET | Freq: Two times a day (BID) | ORAL | Status: DC
  Administered 2024-10-13: 20 meq via ORAL
  Filled 2024-10-12: qty 1

## 2024-10-12 MED ORDER — COLCHICINE 0.6 MG PO TABS
0.6000 mg | ORAL_TABLET | Freq: Every day | ORAL | Status: DC
  Administered 2024-10-13 – 2024-10-17 (×5): 0.6 mg via ORAL
  Filled 2024-10-12 (×5): qty 1

## 2024-10-12 MED ORDER — SODIUM CHLORIDE 0.9 % IV SOLN
2.0000 g | INTRAVENOUS | Status: AC
  Administered 2024-10-13 – 2024-10-17 (×5): 2 g via INTRAVENOUS
  Filled 2024-10-12 (×5): qty 20

## 2024-10-12 MED ORDER — BUDESON-GLYCOPYRROL-FORMOTEROL 160-9-4.8 MCG/ACT IN AERO
2.0000 | INHALATION_SPRAY | Freq: Every day | RESPIRATORY_TRACT | Status: DC
  Administered 2024-10-13 – 2024-10-17 (×5): 2 via RESPIRATORY_TRACT
  Filled 2024-10-12: qty 5.9

## 2024-10-12 MED ORDER — MONTELUKAST SODIUM 10 MG PO TABS
10.0000 mg | ORAL_TABLET | Freq: Every day | ORAL | Status: DC
  Administered 2024-10-13 – 2024-10-17 (×5): 10 mg via ORAL
  Filled 2024-10-12 (×5): qty 1

## 2024-10-12 MED ORDER — VANCOMYCIN HCL IN DEXTROSE 1-5 GM/200ML-% IV SOLN
1000.0000 mg | Freq: Once | INTRAVENOUS | Status: AC
  Administered 2024-10-12: 1000 mg via INTRAVENOUS
  Filled 2024-10-12: qty 200

## 2024-10-12 MED ORDER — ACETAMINOPHEN 500 MG PO TABS
1000.0000 mg | ORAL_TABLET | Freq: Once | ORAL | Status: AC
  Administered 2024-10-12: 1000 mg via ORAL
  Filled 2024-10-12: qty 2

## 2024-10-12 MED ORDER — ASPIRIN 81 MG PO TBEC
81.0000 mg | DELAYED_RELEASE_TABLET | Freq: Every day | ORAL | Status: DC
  Administered 2024-10-13 – 2024-10-17 (×5): 81 mg via ORAL
  Filled 2024-10-12 (×5): qty 1

## 2024-10-12 MED ORDER — SODIUM CHLORIDE 0.9 % IV SOLN
2.0000 g | Freq: Once | INTRAVENOUS | Status: AC
  Administered 2024-10-12: 2 g via INTRAVENOUS
  Filled 2024-10-12: qty 12.5

## 2024-10-12 MED ORDER — SODIUM CHLORIDE 0.9 % IV SOLN
500.0000 mg | INTRAVENOUS | Status: DC
  Administered 2024-10-13 – 2024-10-15 (×4): 500 mg via INTRAVENOUS
  Filled 2024-10-12 (×4): qty 5

## 2024-10-12 MED ORDER — CETIRIZINE HCL 10 MG PO TABS
10.0000 mg | ORAL_TABLET | Freq: Every day | ORAL | Status: DC
  Administered 2024-10-13 – 2024-10-16 (×5): 10 mg via ORAL
  Filled 2024-10-12 (×6): qty 1

## 2024-10-12 MED ORDER — ROPINIROLE HCL 1 MG PO TABS
1.0000 mg | ORAL_TABLET | Freq: Every day | ORAL | Status: DC
  Administered 2024-10-13 – 2024-10-16 (×5): 1 mg via ORAL
  Filled 2024-10-12 (×5): qty 1

## 2024-10-12 MED ORDER — CLOPIDOGREL BISULFATE 75 MG PO TABS
75.0000 mg | ORAL_TABLET | Freq: Every day | ORAL | Status: DC
  Administered 2024-10-13 – 2024-10-17 (×5): 75 mg via ORAL
  Filled 2024-10-12 (×5): qty 1

## 2024-10-12 MED ORDER — FAMOTIDINE 20 MG PO TABS
40.0000 mg | ORAL_TABLET | Freq: Every day | ORAL | Status: DC
  Administered 2024-10-13 – 2024-10-17 (×5): 40 mg via ORAL
  Filled 2024-10-12 (×5): qty 2

## 2024-10-12 MED ORDER — ALBUTEROL SULFATE (2.5 MG/3ML) 0.083% IN NEBU: 2.5000 mg | INHALATION_SOLUTION | Freq: Four times a day (QID) | RESPIRATORY_TRACT | Status: DC | PRN

## 2024-10-12 MED ORDER — OXYCODONE HCL 5 MG PO TABS
15.0000 mg | ORAL_TABLET | Freq: Once | ORAL | Status: AC
  Administered 2024-10-12: 15 mg via ORAL
  Filled 2024-10-12: qty 3

## 2024-10-12 MED ORDER — FLUOXETINE HCL 20 MG PO CAPS
40.0000 mg | ORAL_CAPSULE | Freq: Every day | ORAL | Status: DC
  Administered 2024-10-13 – 2024-10-17 (×5): 40 mg via ORAL
  Filled 2024-10-12 (×5): qty 2

## 2024-10-12 MED ORDER — AMLODIPINE BESYLATE 5 MG PO TABS
5.0000 mg | ORAL_TABLET | Freq: Every day | ORAL | Status: DC
  Administered 2024-10-14 – 2024-10-16 (×3): 5 mg via ORAL
  Filled 2024-10-12 (×4): qty 1

## 2024-10-12 MED ORDER — LACTATED RINGERS IV BOLUS (SEPSIS)
1000.0000 mL | Freq: Once | INTRAVENOUS | Status: AC
Start: 2024-10-12 — End: 2024-10-12
  Administered 2024-10-12: 1000 mL via INTRAVENOUS

## 2024-10-12 MED ORDER — ATORVASTATIN CALCIUM 80 MG PO TABS
80.0000 mg | ORAL_TABLET | Freq: Every day | ORAL | Status: DC
  Administered 2024-10-13 – 2024-10-17 (×5): 80 mg via ORAL
  Filled 2024-10-12 (×5): qty 1

## 2024-10-12 MED ORDER — FE FUM-VIT C-VIT B12-FA 460-60-0.01-1 MG PO CAPS
1.0000 | ORAL_CAPSULE | Freq: Every day | ORAL | Status: DC
  Administered 2024-10-13 – 2024-10-16 (×4): 1 via ORAL
  Filled 2024-10-12 (×6): qty 1

## 2024-10-12 MED ORDER — ADULT MULTIVITAMIN W/MINERALS CH
1.0000 | ORAL_TABLET | Freq: Every day | ORAL | Status: DC
  Administered 2024-10-13 – 2024-10-17 (×5): 1 via ORAL
  Filled 2024-10-12 (×5): qty 1

## 2024-10-12 MED ORDER — SPIRONOLACTONE 25 MG PO TABS
25.0000 mg | ORAL_TABLET | Freq: Every day | ORAL | Status: DC
  Administered 2024-10-13 – 2024-10-16 (×4): 25 mg via ORAL
  Filled 2024-10-12 (×4): qty 1

## 2024-10-12 MED ORDER — LINACLOTIDE 145 MCG PO CAPS
290.0000 ug | ORAL_CAPSULE | Freq: Every day | ORAL | Status: DC
  Administered 2024-10-13 – 2024-10-17 (×5): 290 ug via ORAL
  Filled 2024-10-12 (×6): qty 2

## 2024-10-12 MED ORDER — ENOXAPARIN SODIUM 40 MG/0.4ML IJ SOSY
40.0000 mg | PREFILLED_SYRINGE | INTRAMUSCULAR | Status: DC
  Administered 2024-10-13 – 2024-10-17 (×5): 40 mg via SUBCUTANEOUS
  Filled 2024-10-12 (×5): qty 0.4

## 2024-10-12 MED ORDER — POTASSIUM CHLORIDE 20 MEQ PO PACK
40.0000 meq | PACK | Freq: Once | ORAL | Status: AC
  Administered 2024-10-12: 40 meq via ORAL
  Filled 2024-10-12: qty 2

## 2024-10-12 MED ORDER — FLUTICASONE FUROATE-VILANTEROL 200-25 MCG/ACT IN AEPB: 1.0000 | INHALATION_SPRAY | Freq: Every day | RESPIRATORY_TRACT | Status: DC

## 2024-10-12 MED ORDER — LACTATED RINGERS IV BOLUS (SEPSIS)
500.0000 mL | Freq: Once | INTRAVENOUS | Status: AC
Start: 2024-10-12 — End: 2024-10-12
  Administered 2024-10-12: 500 mL via INTRAVENOUS

## 2024-10-12 MED ORDER — ISOSORBIDE MONONITRATE ER 30 MG PO TB24
30.0000 mg | ORAL_TABLET | Freq: Every day | ORAL | Status: DC
  Administered 2024-10-13 – 2024-10-17 (×5): 30 mg via ORAL
  Filled 2024-10-12 (×5): qty 1

## 2024-10-12 MED ORDER — LACTATED RINGERS IV SOLN
150.0000 mL/h | INTRAVENOUS | Status: AC
  Administered 2024-10-13 (×2): 150 mL/h via INTRAVENOUS

## 2024-10-12 NOTE — ED Triage Notes (Signed)
 Pt to ED via POV from home. Pt with daughter in law. Family reports increased confusion and weakness. Family reports pt fell 4 times yesterday. Pt wears 3-4L [place] chronically. Pt seen yesterday here. Family called PCP again and was told to bring pt back in for CT scan and urine. Pt reports chronic pain in hip and neck and back. Pt has boot on right foot from infection.

## 2024-10-12 NOTE — ED Notes (Signed)
 Attempted to get pt out of soiled clothes and into a gown. Pt refusing a this time stating nah I'm okay.

## 2024-10-12 NOTE — ED Provider Notes (Signed)
 [facility] Provider Note    Event Date/Time   First MD Initiated Contact with Patient 10/12/24 1811     (approximate)   History   Weakness and Fall (/)   HPI  Bradley Sawyer is a 68 y.o. male past medical history significant for COPD, hypertension, prior CVA, chronic 4 L of home oxygen, who presents to the emergency department with multiple falls and generalized weakness.  Family members report increased confusion and generalized weakness.  Patient fell 4 times yesterday and feels very lightheaded upon standing.  Chronic pain with opioid use.  Patient was evaluated yesterday for his foot.  Has chronic back pain and hip pain.     Physical Exam   Triage Vital Signs: ED Triage Vitals  Encounter Vitals Group     BP 10/12/24 1804 (!) 68/50     Girls Systolic BP Percentile --      Girls Diastolic BP Percentile --      Boys Systolic BP Percentile --      Boys Diastolic BP Percentile --      Pulse Rate 10/12/24 1803 100     Resp 10/12/24 1803 18     Temp 10/12/24 1803 99.2 F (37.3 C)     Temp Source 10/12/24 1803 Oral     SpO2 10/12/24 1803 98 %     Weight --      Height --      Head Circumference --      Peak Flow --      Pain Score 10/12/24 1804 6     Pain Loc --      Pain Education --      Exclude from Growth Chart --     Most recent vital signs: Vitals:   10/12/24 1850 10/12/24 2220  BP: (!) 103/54 (!) 95/54  Pulse: (!) 114 66  Resp: 19 20  Temp:  98.9 F (37.2 C)  SpO2: 100% 100%    Physical Exam Constitutional:      Appearance: He is well-developed.  HENT:     Head: Atraumatic.  Eyes:     Extraocular Movements: Extraocular movements intact.     Conjunctiva/sclera: Conjunctivae normal.     Pupils: Pupils are equal, round, and reactive to light.  Cardiovascular:     Rate and Rhythm: Regular rhythm. Tachycardia present.  Pulmonary:     Effort: Respiratory distress present.     Breath sounds: Wheezing present.  Abdominal:      Tenderness: There is no abdominal tenderness.  Musculoskeletal:        General: Normal range of motion.     Cervical back: Normal range of motion. No tenderness.     Right lower leg: No edema.     Left lower leg: No edema.     Comments: No erythema or warmth to lower extremities.  No obvious infection to the right foot.  Patient had a boot to the right foot.  Skin:    General: Skin is warm.     Capillary Refill: Capillary refill takes less than 2 seconds.  Neurological:     General: No focal deficit present.     Mental Status: He is alert. Mental status is at baseline.     IMPRESSION / MDM / ASSESSMENT AND PLAN / ED COURSE  I reviewed the triage vital signs and the nursing notes.  On arrival patient was afebrile, mildly tachycardic with a blood pressure of 68/50.  Differential diagnosis including dehydration, sepsis, orthostatic  hypotension, electrolyte abnormality, hypercarbia, pneumonia, urinary tract infectio, viral illness including COVID/influenza  EKG  I, Clotilda Punter, the attending physician, personally viewed and interpreted this ECG.  EKG showed normal sinus rhythm.  No significant ST elevation or depression.  Nonspecific ST changes.  No tachycardic or bradycardic dysrhythmias while on cardiac telemetry.  RADIOLOGY Chest x-ray with findings concerning for possible pneumonia.  CT scan of the head/cervical spine with no signs of fracture or intracranial hemorrhage  X-ray of bilateral hips with no acute fracture, degenerative change   LABS (all labs ordered are listed, but only abnormal results are displayed) Labs interpreted as -    Labs Reviewed  PROTIME-INR - Abnormal; Notable for the following components:      Result Value   Prothrombin Time 16.4 (*)    INR 1.3 (*)    All other components within normal limits  URINALYSIS, W/ REFLEX TO CULTURE (INFECTION SUSPECTED) - Abnormal; Notable for the following components:   Color, Urine YELLOW (*)     APPearance CLEAR (*)    Hgb urine dipstick MODERATE (*)    Bacteria, UA RARE (*)    All other components within normal limits  BLOOD GAS, VENOUS - Abnormal; Notable for the following components:   pCO2, Ven 68 (*)    Bicarbonate 43.1 (*)    Acid-Base Excess 15.0 (*)    All other components within normal limits  CBC WITH DIFFERENTIAL/PLATELET - Abnormal; Notable for the following components:   RBC 3.22 (*)    Hemoglobin 8.5 (*)    HCT 27.6 (*)    RDW 16.5 (*)    All other components within normal limits  COMPREHENSIVE METABOLIC PANEL WITH GFR - Abnormal; Notable for the following components:   Potassium 2.7 (*)    Chloride 90 (*)    CO2 34 (*)    Glucose, Bld 106 (*)    Creatinine, Ser 1.65 (*)    GFR, Estimated 45 (*)    All other components within normal limits  RESP PANEL BY RT-PCR (RSV, FLU A&B, COVID)  [id number]  CULTURE, BLOOD (ROUTINE X 2)  CULTURE, BLOOD (ROUTINE X 2)  LACTIC ACID, PLASMA  MAGNESIUM   CBC WITH DIFFERENTIAL/PLATELET  CBG MONITORING, ED     MDM  Patient with significant hypotension on arrival.  Blood pressure was 70 systolic.  Concern for possible sepsis.  Blood cultures obtained.  Patient given 30 cc/kg of IV fluids and started on antibiotics for unknown source.  COVID and influenza testing negative  No significant leukocytosis.  Hemoglobin is 8.5 which is decreased from his prior, no signs or symptoms of a GI bleed.  Potassium is low at 2.7.  Normal BUN.  Acute kidney injury with creatinine of 1.6 from a baseline of 1.2.  Museum within normal limits.  Given potassium replacement.  No significant hypercarbia on VBG.  Blood pressure improved following IV fluid.  No obvious findings of an infectious process to the lower extremities.  Given his home pain medication.  Consulted hospitalist for admission for hypotension and concern for possible pneumonia.  Given DuoNeb treatment given significant wheezing on exam.     PROCEDURES:  Critical Care  performed: yes  .Critical Care  Performed by: Punter Clotilda, MD Authorized by: Punter Clotilda, MD   Critical care provider statement:    Critical care time (minutes):  30   Critical care time was exclusive of:  Separately billable procedures and treating other patients   Critical care was necessary to treat or prevent  imminent or life-threatening deterioration of the following conditions:  Circulatory failure   Critical care was time spent personally by me on the following activities:  Development of treatment plan with patient or surrogate, discussions with consultants, evaluation of patient's response to treatment, examination of patient, ordering and review of laboratory studies, ordering and review of radiographic studies, ordering and performing treatments and interventions, pulse oximetry, re-evaluation of patient's condition and review of old charts   Patient's presentation is most consistent with acute presentation with potential threat to life or bodily function.   MEDICATIONS ORDERED IN ED: Medications  lactated ringers  infusion (has no administration in time range)  lactated ringers  bolus 1,000 mL (0 mLs Intravenous Stopped 10/12/24 1932)    And  lactated ringers  bolus 1,000 mL (1,000 mLs Intravenous New Bag/Given 10/12/24 2010)    And  lactated ringers  bolus 500 mL (0 mLs Intravenous Stopped 10/12/24 1932)  ceFEPIme  (MAXIPIME ) 2 g in sodium chloride  0.9 % 100 mL IVPB (0 g Intravenous Stopped 10/12/24 1920)  metroNIDAZOLE  (FLAGYL ) IVPB 500 mg (0 mg Intravenous Stopped 10/12/24 2109)  vancomycin  (VANCOCIN ) IVPB 1000 mg/200 mL premix (1,000 mg Intravenous New Bag/Given 10/12/24 2113)  ipratropium-albuterol  (DUONEB) 0.5-2.5 (3) MG/3ML nebulizer solution 6 mL (6 mLs Nebulization Given 10/12/24 2106)  acetaminophen  (TYLENOL ) tablet 1,000 mg (1,000 mg Oral Given 10/12/24 2105)  oxyCODONE  (Oxy IR/ROXICODONE ) immediate release tablet 15 mg (15 mg Oral Given 10/12/24 2113)  potassium  chloride (KLOR-CON ) packet 40 mEq (40 mEq Oral Given 10/12/24 2139)    FINAL CLINICAL IMPRESSION(S) / ED DIAGNOSES   Final diagnoses:  Hypotension, unspecified hypotension type     Rx / DC Orders   ED Discharge Orders     None        Note:  This document was prepared using Dragon voice recognition software and may include unintentional dictation errors.   Suzanne Kirsch, MD 10/12/24 2233

## 2024-10-12 NOTE — H&P (Signed)
 History and Physical    Patient: Bradley Sawyer FMW:[id number] DOB: 11/13/1956 DOA: 10/12/2024 DOS: the patient was seen and examined on 10/12/2024 PCP: Corrington, Kip A, MD  Patient coming from: Home  Chief Complaint:  Chief Complaint  Patient presents with   Weakness   Fall        HPI: Bradley Sawyer is a 68 y.o. male with medical history significant of essential hypertension, GERD, COPD, history of CVA, hyperlipidemia, osteoarthritis, anxiety disorder, iron deficiency anemia, gout, who presents to the ER with shortness of breath cough and fever.  Patient here was borderline low.  He has had confusion and weakness.  He fell at least 4 times yesterday.  He has chronic respiratory failure on 3 to 4 L of oxygen.  Was in the ER yesterday seen evaluated and discharged home.  He got worse today and they called the PCP who asked them to bring patient back to the ER.  Workup today shows that patient has met sepsis criteria with evidence of pneumonia.  At this point he is being admitted to the hospital for further evaluation and treatment.  Patient is Otherwise hemodynamically stable.  Review of Systems: As mentioned in the history of present illness. All other systems reviewed and are negative. 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   Compression fracture    L2   COPD (chronic obstructive pulmonary disease) (HCC)    bronchitis   CVA (cerebral infarction)    Disorder of vocal cord    Dyspnea    Fatty liver    GERD (gastroesophageal reflux disease)    Hx of cardiovascular stress test    ETT/Lexiscan -Myoview (10/15):  Inferior, inferoapical, apical lateral defect c/w mild ischemia, EF 66%; Intermediate Risk   Hyperlipidemia    Hypertension    Right shoulder pain    2/2 partial rotator cuff tear, tendinopathy, mild  subacromial/subdeltioid bursitis, A/C joint arthropathy per MRI done in 3/07   Stroke (HCC) 2014   Tobacco abuse    Past Surgical History:  Procedure Laterality Date   ANTERIOR LAT LUMBAR FUSION N/A 12/18/2013   Procedure: Lumbar Two Anteriorlateral Corpectomy w/ cage, interbody fusion, anterior plating, Lumbar One to Lumbar Three percutaneous pedicle screws;  Surgeon: Victory DELENA Gunnels, MD;  Location: [facility]URO [facility];  Service: Neurosurgery;  Laterality: N/A;   AORTA -INNOMIATE BYPASS N/A 10/15/2014   Procedure: ENDARTERCTOMY OF  INNOMINATE ARTERY ;  Surgeon: Carlin FORBES Haddock, MD;  Location: [facility] OR;  Service: Vascular;  Laterality: N/A;   ARCH AORTOGRAM N/A 08/23/2014   Procedure: ARCH AORTOGRAM;  Surgeon: Carlin FORBES Haddock, MD;  Location: [facility] CATH LAB;  Service: Cardiovascular;  Laterality: N/A;   arch aortogram, left carotid,left subclavian angiogram  08/23/2014   BIOPSY  09/14/2023   Procedure: BIOPSY;  Surgeon: Jinny Carmine, MD;  Location: [facility] ENDOSCOPY;  Service: Endoscopy;;   CAROTID ANGIOGRAM Left 08/23/2014   Procedure: CAROTID GERALYN;  Surgeon: Carlin FORBES Haddock, MD;  Location: [facility] CATH LAB;  Service: Cardiovascular;  Laterality: Left;   CHOLECYSTECTOMY  12/2016   COLONOSCOPY WITH PROPOFOL  N/A 09/14/2023   Procedure: COLONOSCOPY WITH PROPOFOL ;  Surgeon: Jinny Carmine, MD;  Location: [facility] ENDOSCOPY;  Service: Endoscopy;  Laterality: N/A;   ENDARTERECTOMY Right 10/15/2014   Procedure: ENDARTERECTOMY RIGHT SUBCLAVIAN ARTERY;  Surgeon: Carlin FORBES Haddock, MD;  Location:  [facility] OR;  Service: Vascular;  Laterality: Right;   ENDARTERECTOMY Right 10/15/2014   Procedure: ENDARTERECTOMY RIGHT COMMON CAROTID ARTERY;  Surgeon: Carlin FORBES Haddock, MD;  Location: [facility] OR;  Service: Vascular;  Laterality: Right;   ESOPHAGOGASTRODUODENOSCOPY (EGD) WITH PROPOFOL  N/A 09/14/2023   Procedure: ESOPHAGOGASTRODUODENOSCOPY (EGD) WITH PROPOFOL ;  Surgeon: Jinny Carmine, MD;  Location: [facility] ENDOSCOPY;  Service: Endoscopy;   Laterality: N/A;   ESOPHAGOGASTRODUODENOSCOPY (EGD) WITH PROPOFOL  N/A 11/24/2023   Procedure: ESOPHAGOGASTRODUODENOSCOPY (EGD) WITH PROPOFOL ;  Surgeon: Jinny Carmine, MD;  Location: [facility] ENDOSCOPY;  Service: Endoscopy;  Laterality: N/A;   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   HERNIA REPAIR     LEFT HEART CATH AND CORONARY ANGIOGRAPHY N/A 03/17/2021   Procedure: LEFT HEART CATH AND CORONARY ANGIOGRAPHY;  Surgeon: Fernand Denyse LABOR, MD;  Location: [facility] INVASIVE CV LAB;  Service: Cardiovascular;  Laterality: N/A;   LEFT HEART CATHETERIZATION WITH CORONARY ANGIOGRAM N/A 09/12/2014   Procedure: LEFT HEART CATHETERIZATION WITH CORONARY ANGIOGRAM;  Surgeon: Maude JAYSON Emmer, MD;  Location: [facility] CATH LAB;  Service: Cardiovascular;  Laterality: N/A;   LUMBAR SPINE SURGERY  12/18/2013   L2     DR POOL   NECK SURGERY     PATCH ANGIOPLASTY Right 10/15/2014   Procedure: PATCH ANGIOPLASTY OF RIGHT SUBCLAVIAN ARTERY , RIGHT COMMON CAROTID ARTERY & INNOMINATE ARTERY;  Surgeon: Carlin FORBES Haddock, MD;  Location: [facility] OR;  Service: Vascular;  Laterality: Right;   STERNOTOMY N/A 10/15/2014   Procedure: PARTIAL STERNOTOMY & PLATING OF STERNUM;  Surgeon: Carlin FORBES Haddock, MD;  Location: [facility] OR;  Service: Vascular;  Laterality: N/A;   TEE WITHOUT CARDIOVERSION N/A 12/25/2013   Procedure: TRANSESOPHAGEAL ECHOCARDIOGRAM (TEE);  Surgeon: Ezra GORMAN Shuck, MD;  Location: [facility] ENDOSCOPY;  Service: Cardiovascular;  Laterality: N/A;   Social History:  reports that he quit smoking about 3 years ago. His smoking use included cigarettes. He started smoking about 51 years ago. He has a 96 pack-year smoking history. He has never used smokeless tobacco. He reports that he does not drink alcohol and does not use drugs.  Allergies  Allergen Reactions   Bee Venom Swelling   Tapentadol Shortness Of Breath    Other Reaction(s): sob/tachycardia    Family History  Problem Relation Age of Onset   Heart disease Father    Heart attack  Father    Diabetes Paternal Grandmother        1 st degree relatives   Hypertension Paternal Grandmother    Diabetes Brother    Heart disease Brother    Muscular dystrophy Brother    Muscular dystrophy Sister    Prostate cancer Neg Hx    Kidney cancer Neg Hx    Bladder Cancer Neg Hx     Prior to Admission medications   Medication Sig Start Date End Date Taking? Authorizing Provider  albuterol  (PROVENTIL ) (2.5 MG/3ML) 0.083% nebulizer solution Take 2.5 mg by nebulization every 6 (six) hours as needed for wheezing or shortness of breath. 07/07/24   [provider]  albuterol  (VENTOLIN  HFA) 108 (90 Base) MCG/ACT inhaler INHALE 2 PUFFS BY MOUTH EVERY 4-6 HOURS AS NEEDED FOR WHEEZING 02/25/23   Tamea Dedra CROME, MD  amLODipine  (NORVASC ) 5 MG tablet Take 5 mg by mouth daily. 06/02/24   [provider]  aspirin  EC 81 MG tablet Take 81 mg by mouth daily.    [provider]  atorvastatin  (LIPITOR ) 80 MG tablet TAKE ONE TABLET BY MOUTH ONCE DAILY AT BEDTIME 01/03/24  Fernand Denyse LABOR, MD  BREZTRI  AEROSPHERE 160-9-4.8 MCG/ACT AERO inhaler INHALE TWO PUFFS into THE lungs TWICE DAILY morning & bedtime 06/01/24   Tamea Dedra CROME, MD  clopidogrel  (PLAVIX ) 75 MG tablet TAKE ONE TABLET BY MOUTH ONCE DAILY 01/03/24   Fernand Denyse LABOR, MD  colchicine  0.6 MG tablet Take 1 tablet (0.6 mg total) by mouth daily. 09/14/24   Janit Thresa HERO, DPM  cyclobenzaprine  (FLEXERIL ) 10 MG tablet Take 10 mg by mouth 2 (two) times daily as needed. 09/03/24   [provider]  EPINEPHrine  0.3 mg/0.3 mL IJ SOAJ injection Inject 0.3 mg into the muscle as needed for anaphylaxis. 04/04/20   [provider]  famotidine  (PEPCID ) 40 MG tablet TAKE ONE TABLET BY MOUTH ONCE DAILY 01/03/24   Fernand Denyse LABOR, MD  Fe Fum-Vit C-Vit B12-FA (TRIGELS-F FORTE) CAPS capsule Take 1 capsule by mouth daily after breakfast. 08/08/24 11/06/24  Von Bellis, MD  FLUoxetine  (PROZAC ) 40 MG capsule Take 40 mg by  mouth daily. 09/15/23   [provider]  fluticasone -salmeterol (ADVAIR) 250-50 MCG/ACT AEPB Inhale 1 puff into the lungs 2 (two) times daily. 06/14/24   [provider]  furosemide  (LASIX ) 20 MG tablet TAKE ONE TABLET BY MOUTH EVERY DAY 01/03/24   Fernand Denyse LABOR, MD  ipratropium-albuterol  (DUONEB) 0.5-2.5 (3) MG/3ML SOLN Take 3 mLs by nebulization every 6 (six) hours as needed. 03/01/24   Tamea Dedra CROME, MD  isosorbide  mononitrate (IMDUR ) 30 MG 24 hr tablet Take 30 mg by mouth daily.    [provider]  levocetirizine (XYZAL ) 5 MG tablet Take 5 mg by mouth at bedtime. 07/15/20   [provider]  linaclotide  (LINZESS ) 290 MCG CAPS capsule Take 290 mcg by mouth daily before breakfast. 12/05/19   [provider]  metFORMIN  (GLUCOPHAGE ) 500 MG tablet Take 500 mg by mouth daily. 07/22/20   [provider]  montelukast  (SINGULAIR ) 10 MG tablet Take 10 mg by mouth daily. 07/15/20   [provider]  Multiple Vitamin (MULTI-VITAMINS) TABS Take 1 tablet by mouth daily.    [provider]  naloxone  (NARCAN ) nasal spray 4 mg/0.1 mL Place 0.4 mg into the nose once. 09/02/16   [provider]  nitroGLYCERIN  (NITROSTAT ) 0.4 MG SL tablet DISSOLVE (1) TABLET UNDER TONGUE AS NEEDED TO RELIEVE CHEST PAIN. MAY REPEAT EVERY 5 MINUTES. 05/06/23   Fernand Denyse LABOR, MD  ondansetron  (ZOFRAN ) 4 MG tablet Take by mouth. 02/04/22   [provider]  oxyCODONE  (ROXICODONE ) 15 MG immediate release tablet Take 15 mg by mouth every 4 (four) hours. 10/09/21   [provider]  OXYGEN Inhale 3 L into the lungs.    [provider]  pantoprazole  (PROTONIX ) 40 MG tablet Take 1 tablet (40 mg total) by mouth 2 (two) times daily before a meal. LAST REFILL MUST SCHEDULE VISIT 09/18/24   Jinny Carmine, MD  polyethylene glycol (MIRALAX  / GLYCOLAX ) packet Take 17 g by mouth daily as needed (for constipation).    [provider]   potassium chloride  SA (KLOR-CON  M) 20 MEQ tablet Take 1 tablet (20 mEq total) by mouth 2 (two) times daily. 10/11/24   Fisher, Devere ORN, PA-C  predniSONE  (STERAPRED UNI-PAK 21 TAB) 10 MG (21) TBPK tablet Take 6 pills on day one then decrease by 1 pill each day 10/11/24   Gasper Devere ORN, PA-C  rOPINIRole  (REQUIP ) 1 MG tablet Take 1 tablet by mouth at bedtime. 06/01/21   [provider]  spironolactone  (ALDACTONE )  25 MG tablet TAKE ONE TABLET BY MOUTH ONCE DAILY 01/03/24   Fernand Denyse LABOR, MD  tamsulosin  (FLOMAX ) 0.4 MG CAPS capsule TAKE ONE CAPSULE BY MOUTH EVERY DAY 03/07/23   McGowan, Clotilda A, PA-C  traMADol  (ULTRAM ) 50 MG tablet Take 1 tablet (50 mg total) by mouth every 6 (six) hours as needed. 10/11/24   Gasper Devere ORN, PA-C    Physical Exam: Vitals:   10/12/24 1846 10/12/24 1850 10/12/24 2220 10/12/24 2250  BP: (!) 79/52 (!) 103/54 (!) 95/54 101/63  Pulse: 75 (!) 114 66 69  Resp: 20 19 20 16   Temp:   98.9 F (37.2 C) 98.2 F (36.8 C)  TempSrc:   Oral   SpO2: 100% 100% 100% 96%   Constitutional: Chronically ill looking, NAD, calm, comfortable Eyes: PERRL, lids and conjunctivae normal ENMT: Mucous membranes are dry posterior pharynx clear of any exudate or lesions.Normal dentition.  Neck: normal, supple, no masses, no thyromegaly Respiratory: Coarse breath sound with mild expiratory wheezing, no crackles. Normal respiratory effort. No accessory muscle use.  Cardiovascular: Sinus tachycardia, no murmurs / rubs / gallops. No extremity edema. 2+ pedal pulses. No carotid bruits.  Abdomen: no tenderness, no masses palpated. No hepatosplenomegaly. Bowel sounds positive.  Musculoskeletal: Good range of motion, no joint swelling or tenderness, Skin: no rashes, lesions, ulcers. No induration Neurologic: CN 2-12 grossly intact. Sensation intact, DTR normal. Strength 5/5 in all 4.  Psychiatric: Normal judgment and insight. Alert and oriented x 3. Normal mood  Data  Reviewed:  Temperature 99.2, blood pressure 68/50, pulse 120, oxygen sat 96 on 4 L.  White count is 9.8 hemoglobin 8.5.  Potassium 2.7 CO2 34 creatinine 1.65 and INR 1.3.  Acute viral screen is negative for flu RSV and SARS urinalysis is negative.  Chest x-ray showed new patchy bibasilar airspace opacities most likely pneumonia.  CT cervical spine and CT head without contrast are negative.  EKG showed normal sinus rhythm with some sinus tachycardia  Assessment and Plan:  #1 sepsis due to pneumonia: Patient met sepsis criteria with initial temperature of 101 at home.  No leukocytosis but has tachycardia.  Evidence of pneumonia.  Patient has severe sepsis with hypotension.  Lactic acid is however 1.4.  Will admit the patient for treatment of the pneumonia.  Initiate IV Rocephin  and Zithromax .  Continue oxygen.  Currently on 4 L.  #2 severe hypokalemia: Replete potassium.  Has received IV in the ER.  Continue inpatient.  Poor oral intake may have contributed.  Checked magnesium  level and is 1.7.  #3 AKI: Hydrate and monitor renal function  #4 normocytic anemia: Probably due to chronic disease.  Continue to monitor.  #5 essential hypertension: Confirm on resume home regimen.  #6 COPD: No clear exacerbation.  Continue to monitor  #7 coronary artery disease: Stable at baseline  #8 GERD: Continue PPIs  #9 hyperlipidemia: Continue with statin  #10 BPH: Continue home regimen    Advance Care Planning:   Code Status: Full Code   Consults: None  Family Communication: Daughter-in-law  Severity of Illness: The appropriate patient status for this patient is INPATIENT. Inpatient status is judged to be reasonable and necessary in order to provide the required intensity of service to ensure the patient's safety. The patient's presenting symptoms, physical exam findings, and initial radiographic and laboratory data in the context of their chronic comorbidities is felt to place them at high risk for  further clinical deterioration. Furthermore, it is not anticipated that the patient will  be medically stable for discharge from the hospital within 2 midnights of admission.   * I certify that at the point of admission it is my clinical judgment that the patient will require inpatient hospital care spanning beyond 2 midnights from the point of admission due to high intensity of service, high risk for further deterioration and high frequency of surveillance required.*  AuthorBETHA SIM KNOLL, MD 10/12/2024 11:44 PM  For on call review [email or web address].

## 2024-10-12 NOTE — Progress Notes (Signed)
 CODE SEPSIS - PHARMACY COMMUNICATION  **Broad Spectrum Antibiotics should be administered within 1 hour of Sepsis diagnosis**  Time Code Sepsis Called/Page Received: 1815  Antibiotics Ordered: cefepime , metronidazole , vancomycin   Time of 1st antibiotic administration: 1843  Additional action taken by pharmacy: -  If necessary, Name of Provider/Nurse Contacted: -    Lum VEAR Mania ,PharmD Clinical Pharmacist  10/12/2024  6:15 PM

## 2024-10-12 NOTE — Sepsis Progress Note (Signed)
 Sepsis protocol monitored by eLink

## 2024-10-13 DIAGNOSIS — A419 Sepsis, unspecified organism: Secondary | ICD-10-CM

## 2024-10-13 DIAGNOSIS — N179 Acute kidney failure, unspecified: Secondary | ICD-10-CM

## 2024-10-13 DIAGNOSIS — I2583 Coronary atherosclerosis due to lipid rich plaque: Secondary | ICD-10-CM

## 2024-10-13 DIAGNOSIS — J189 Pneumonia, unspecified organism: Secondary | ICD-10-CM | POA: Diagnosis not present

## 2024-10-13 DIAGNOSIS — I2510 Atherosclerotic heart disease of native coronary artery without angina pectoris: Secondary | ICD-10-CM | POA: Diagnosis not present

## 2024-10-13 DIAGNOSIS — J42 Unspecified chronic bronchitis: Secondary | ICD-10-CM | POA: Diagnosis not present

## 2024-10-13 LAB — COMPREHENSIVE METABOLIC PANEL WITH GFR
ALT: 15 U/L (ref 0–44)
AST: 26 U/L (ref 15–41)
Albumin: 2.9 g/dL — ABNORMAL LOW (ref 3.5–5.0)
Alkaline Phosphatase: 90 U/L (ref 38–126)
Anion gap: 8 (ref 5–15)
BUN: 15 mg/dL (ref 8–23)
CO2: 35 mmol/L — ABNORMAL HIGH (ref 22–32)
Calcium: 8.4 mg/dL — ABNORMAL LOW (ref 8.9–10.3)
Chloride: 96 mmol/L — ABNORMAL LOW (ref 98–111)
Creatinine, Ser: 1.19 mg/dL (ref 0.61–1.24)
GFR, Estimated: >60 mL/min (ref >60)
Glucose, Bld: 92 mg/dL (ref 70–99)
Potassium: 2.5 mmol/L — CL (ref 3.5–5.1)
Sodium: 139 mmol/L (ref 135–145)
Total Bilirubin: 0.4 mg/dL (ref 0.0–1.2)
Total Protein: 5.6 g/dL — ABNORMAL LOW (ref 6.5–8.1)

## 2024-10-13 LAB — PREPARE RBC (CROSSMATCH)

## 2024-10-13 LAB — POTASSIUM: Potassium: 3.2 mmol/L — ABNORMAL LOW (ref 3.5–5.1)

## 2024-10-13 LAB — PROTIME-INR
INR: 1.3 — ABNORMAL HIGH (ref 0.8–1.2)
Prothrombin Time: 17 s — ABNORMAL HIGH (ref 11.4–15.2)

## 2024-10-13 LAB — CK: Total CK: 226 U/L (ref 49–397)

## 2024-10-13 LAB — HIV ANTIBODY (ROUTINE TESTING W REFLEX): HIV Screen 4th Generation wRfx: NONREACTIVE

## 2024-10-13 LAB — CBC
HCT: 21.7 % — ABNORMAL LOW (ref 39.0–52.0)
Hemoglobin: 6.7 g/dL — ABNORMAL LOW (ref 13.0–17.0)
MCH: 26.3 pg (ref 26.0–34.0)
MCHC: 30.9 g/dL (ref 30.0–36.0)
MCV: 85.1 fL (ref 80.0–100.0)
Platelets: 197 K/uL (ref 150–400)
RBC: 2.55 MIL/uL — ABNORMAL LOW (ref 4.22–5.81)
RDW: 16.6 % — ABNORMAL HIGH (ref 11.5–15.5)
WBC: 5.7 K/uL (ref 4.0–10.5)
nRBC: 0 % (ref 0.0–0.2)

## 2024-10-13 LAB — CORTISOL-AM, BLOOD: Cortisol - AM: 4.4 ug/dL — ABNORMAL LOW (ref 6.7–22.6)

## 2024-10-13 LAB — ABO/RH: ABO/RH(D): O POS

## 2024-10-13 LAB — MAGNESIUM: Magnesium: 2.2 mg/dL (ref 1.7–2.4)

## 2024-10-13 MED ORDER — POTASSIUM CHLORIDE CRYS ER 20 MEQ PO TBCR
40.0000 meq | EXTENDED_RELEASE_TABLET | Freq: Once | ORAL | Status: AC
  Administered 2024-10-13: 40 meq via ORAL
  Filled 2024-10-13: qty 2

## 2024-10-13 MED ORDER — POTASSIUM CHLORIDE 10 MEQ/100ML IV SOLN
10.0000 meq | INTRAVENOUS | Status: AC
  Administered 2024-10-13 (×6): 10 meq via INTRAVENOUS
  Filled 2024-10-13 (×5): qty 100

## 2024-10-13 MED ORDER — MAGNESIUM SULFATE 2 GM/50ML IV SOLN
2.0000 g | Freq: Once | INTRAVENOUS | Status: AC
  Administered 2024-10-13: 2 g via INTRAVENOUS
  Filled 2024-10-13: qty 50

## 2024-10-13 MED ORDER — POTASSIUM CHLORIDE 10 MEQ/100ML IV SOLN
10.0000 meq | INTRAVENOUS | Status: DC
  Filled 2024-10-13: qty 100

## 2024-10-13 MED ORDER — POTASSIUM CHLORIDE CRYS ER 20 MEQ PO TBCR
20.0000 meq | EXTENDED_RELEASE_TABLET | Freq: Two times a day (BID) | ORAL | Status: DC
  Administered 2024-10-13 – 2024-10-17 (×8): 20 meq via ORAL
  Filled 2024-10-13 (×8): qty 1

## 2024-10-13 MED ORDER — SODIUM CHLORIDE 0.9 % IV SOLN: INTRAVENOUS | Status: AC | PRN

## 2024-10-13 MED ORDER — SODIUM CHLORIDE 0.9% IV SOLUTION: Freq: Once | INTRAVENOUS | Status: AC

## 2024-10-13 NOTE — Progress Notes (Signed)
 PHARMACY CONSULT NOTE - FOLLOW UP  Pharmacy Consult for Electrolyte Monitoring and Replacement   Recent Labs: Potassium (mmol/L)  Date Value  10/13/2024 2.5 (LL)  07/01/2013 3.2 (L)   Magnesium  (mg/dL)  Date Value  88/78/7974 1.7   Calcium  (mg/dL)  Date Value  88/77/7974 8.4 (L)   Calcium , Total (mg/dL)  Date Value  91/89/7985 9.5   Albumin (g/dL)  Date Value  88/77/7974 2.9 (L)  06/27/2013 3.6   Phosphorus (mg/dL)  Date Value  89/83/7974 3.3   Sodium (mmol/L)  Date Value  10/13/2024 139  07/01/2013 136     Assessment: 11/22 :  K @ 0438 = 2.5  Goal of Therapy:  Electrolytes WNL   Plan:  KCl 10 mEq IV X 6 ordered to start on 11/22 @ 0700 KCl 40 mEq PO X 1 ordered for 11/22 @ 0700 - recheck electrolytes @ 1400  Bradley Sawyer ,PharmD Clinical Pharmacist 10/13/2024 6:15 AM

## 2024-10-13 NOTE — Progress Notes (Signed)
 1      PROGRESS NOTE    Bradley Sawyer  FMW:[id number] DOB: [birth year] DOA: 10/12/2024 PCP: Bobbette Coye LABOR, MD   Brief Narrative:   68 y.o. male with medical history significant of essential hypertension, GERD, COPD, history of CVA, hyperlipidemia, osteoarthritis, anxiety disorder, iron deficiency anemia, gout, who presents to the ER with shortness of breath cough and fever   11/22: PT and OT consult, palliative care consult   Assessment & Plan:   Principal Problem:   Sepsis due to pneumonia [facility]) Active Problems:   AKI (acute kidney injury)   Essential hypertension   COPD (chronic obstructive pulmonary disease) (HCC)   CAD (coronary artery disease)   Hyperlipemia   GERD   BPH (benign prostatic hyperplasia)   Hypotension   Chronic respiratory failure with hypoxia (HCC)   PAD (peripheral artery disease)   History of CVA (cerebrovascular accident)   TOBACCO ABUSE   Hypokalemia   #1 sepsis due to pneumonia: Patient met sepsis criteria with initial temperature of 101 at home.  No leukocytosis but has tachycardia.  Evidence of pneumonia.  Patient has severe sepsis with hypotension.  Lactic acid is however 1.4.   - Continue IV Rocephin  and Zithromax .  Initially required 4 L and now weaned off   #2 severe hypokalemia: Aggressive replacement.  Pharmacy managing electrolytes   #3 AKI: Likely prerenal.  Resolved with hydration   #4 normocytic anemia: Probably due to chronic disease.  Continue to monitor.   #5 essential hypertension: Continue Norvasc , Imdur , Aldactone    #6 COPD: No clear exacerbation.  Continue to monitor   #7 coronary artery disease: Stable at baseline   #8 GERD: Continue PPIs   #9 hyperlipidemia: Continue with statin, check CK due to recurrent fall   #10 BPH: Continue home regimen  11.  Chronic pain: Patient is receiving Roxicodone  15 mg every 4 hours scheduled.  Will consult palliative care to see if they can help with any pain management.  Patient  reports having to follow-up with pain management doctor as an outpatient but has not seen them in his pain is being managed by PCP as per patient   DVT prophylaxis: (Lovenox  enoxaparin  (LOVENOX ) injection 40 mg Start: 10/13/24 0800     Code Status: (Full code Family Communication: (None at bedside Disposition Plan: (Possible discharge in next 2 to 3 days depending on clinical condition and PT, OT eval     Antimicrobials:  Rocephin  Azithromycin    Subjective:  Complains of weakness and recurrent fall.  Sleepy  Objective: Vitals:   10/13/24 0747 10/13/24 1311 10/13/24 1324 10/13/24 1325  BP: (!) 100/56 (!) 95/53 107/80 107/80  Pulse: 61 70 67 67  Resp: 17 17 17 17   Temp: 97.9 F (36.6 C) (!) 97.5 F (36.4 C) 98 F (36.7 C) 98 F (36.7 C)  TempSrc:      SpO2: 99% 97%  96%  Weight:      Height:        Intake/Output Summary (Last 24 hours) at 10/13/2024 1453 Last data filed at 10/13/2024 1156 Gross per 24 hour  Intake 2017.18 ml  Output 1450 ml  Net 567.18 ml   Filed Weights   10/12/24 2250  Weight: 84.7 kg    Examination:  General exam: Appears calm and comfortable  Respiratory system: Clear to auscultation. Respiratory effort normal. Cardiovascular system: S1 & S2 heard, RRR. No murmurs, rubs, gallops or clicks. No pedal edema. Gastrointestinal system: Abdomen is soft, benign Central nervous system: Sleepy.  No focal neurological deficits. Extremities: Symmetric 5 x 5 power. Skin: No rashes, lesions or ulcers Psychiatry: Judgement and insight appear normal. Mood & affect appropriate.     Data Reviewed: I have personally reviewed following labs and imaging studies  CBC: Recent Labs  Lab 10/11/24 1252 10/12/24 1845 10/13/24 0438  WBC 11.8* 9.8 5.7  NEUTROABS 8.6* 7.0  --   HGB 9.6* 8.5* 6.7*  HCT 30.9* 27.6* 21.7*  MCV 84.9 85.7 85.1  PLT 240 244 197   Basic Metabolic Panel: Recent Labs  Lab 10/11/24 1252 10/12/24 1845 10/13/24 0438  10/13/24 1119  NA 138 136 139  --   K 2.6* 2.7* 2.5* 3.2*  CL 93* 90* 96*  --   CO2 35* 34* 35*  --   GLUCOSE 106* 106* 92  --   BUN 9 18 15   --   CREATININE 1.06 1.65* 1.19  --   CALCIUM  9.0 8.9 8.4*  --   MG  --  1.7  --  2.2   GFR: Estimated Creatinine Clearance: 63.3 mL/min (by C-G formula based on SCr of 1.19 mg/dL). Liver Function Tests: Recent Labs  Lab 10/11/24 1252 10/12/24 1845 10/13/24 0438  AST 27 37 26  ALT 23 20 15   ALKPHOS 145* 113 90  BILITOT 0.8 0.5 0.4  PROT 7.4 6.8 5.6*  ALBUMIN 4.0 3.5 2.9*   No results for input(s): LIPASE, AMYLASE in the last 168 hours. No results for input(s): AMMONIA in the last 168 hours. Coagulation Profile: Recent Labs  Lab 10/12/24 1829 10/13/24 0438  INR 1.3* 1.3*    Sepsis Labs: Recent Labs  Lab 10/12/24 1829  LATICACIDVEN 1.4    Recent Results (from the past 240 hours)  Blood Culture (routine x 2)     Status: None (Preliminary result)   Collection Time: 10/12/24  6:29 PM   Specimen: BLOOD  Result Value Ref Range Status   Specimen Description BLOOD LEFT ANTECUBITAL  Final   Special Requests   Final    BOTTLES DRAWN AEROBIC AND ANAEROBIC Blood Culture adequate volume   Culture   Final    NO GROWTH < 12 HOURS Performed at [facility], [address] [place], [place] [address]    Report Status PENDING  Incomplete  Blood Culture (routine x 2)     Status: None (Preliminary result)   Collection Time: 10/12/24  6:29 PM   Specimen: BLOOD  Result Value Ref Range Status   Specimen Description BLOOD RIGHT ANTECUBITAL  Final   Special Requests   Final    BOTTLES DRAWN AEROBIC AND ANAEROBIC Blood Culture adequate volume   Culture   Final    NO GROWTH < 12 HOURS Performed at [facility], [address] [place], [place] [address]    Report Status PENDING  Incomplete  Resp panel by RT-PCR (RSV, Flu A&B, Covid) Anterior Nasal Swab     Status: None   Collection Time: 10/12/24  6:45 PM    Specimen: Anterior Nasal Swab  Result Value Ref Range Status   SARS Coronavirus 2 by RT PCR NEGATIVE NEGATIVE Final    Comment: (NOTE) SARS-CoV-2 target nucleic acids are NOT DETECTED.  The SARS-CoV-2 RNA is generally detectable in upper respiratory specimens during the acute phase of infection. The lowest concentration of SARS-CoV-2 viral copies this assay can detect is 138 copies/mL. A negative result does not preclude SARS-Cov-2 infection and should not be used as the sole basis for treatment or other patient management  decisions. A negative result may occur with  improper specimen collection/handling, submission of specimen other than nasopharyngeal swab, presence of viral mutation(s) within the areas targeted by this assay, and inadequate number of viral copies(<138 copies/mL). A negative result must be combined with clinical observations, patient history, and epidemiological information. The expected result is Negative.  Fact Sheet for Patients:  bloggercourse.com  Fact Sheet for Healthcare Providers:  seriousbroker.it  This test is no t yet approved or cleared by the [place]  FDA and  has been authorized for detection and/or diagnosis of SARS-CoV-2 by FDA under an Emergency Use Authorization (EUA). This EUA will remain  in effect (meaning this test can be used) for the duration of the COVID-19 declaration under Section 564(b)(1) of the Act, 21 U.S.C.section 360bbb-3(b)(1), unless the authorization is terminated  or revoked sooner.       Influenza A by PCR NEGATIVE NEGATIVE Final   Influenza B by PCR NEGATIVE NEGATIVE Final    Comment: (NOTE) The Xpert Xpress SARS-CoV-2/FLU/RSV plus assay is intended as an aid in the diagnosis of influenza from Nasopharyngeal swab specimens and should not be used as a sole basis for treatment. Nasal washings and aspirates are unacceptable for Xpert Xpress  SARS-CoV-2/FLU/RSV testing.  Fact Sheet for Patients: bloggercourse.com  Fact Sheet for Healthcare Providers: seriousbroker.it  This test is not yet approved or cleared by the [place]  FDA and has been authorized for detection and/or diagnosis of SARS-CoV-2 by FDA under an Emergency Use Authorization (EUA). This EUA will remain in effect (meaning this test can be used) for the duration of the COVID-19 declaration under Section 564(b)(1) of the Act, 21 U.S.C. section 360bbb-3(b)(1), unless the authorization is terminated or revoked.     Resp Syncytial Virus by PCR NEGATIVE NEGATIVE Final    Comment: (NOTE) Fact Sheet for Patients: bloggercourse.com  Fact Sheet for Healthcare Providers: seriousbroker.it  This test is not yet approved or cleared by the [place]  FDA and has been authorized for detection and/or diagnosis of SARS-CoV-2 by FDA under an Emergency Use Authorization (EUA). This EUA will remain in effect (meaning this test can be used) for the duration of the COVID-19 declaration under Section 564(b)(1) of the Act, 21 U.S.C. section 360bbb-3(b)(1), unless the authorization is terminated or revoked.  Performed at [facility], [address] [place], [place] [address]          Radiology Studies: DG HIPS BILAT WITH PELVIS MIN 5 VIEWS Result Date: 10/12/2024 EXAM: 5 VIEW(S) XRAY OF THE PELVIS AND BILATERAL HIP 10/12/2024 09:35:11 PM COMPARISON: None available. CLINICAL HISTORY: 809823 Fall 190176 FINDINGS: BONES AND JOINTS: SI joints are symmetric. No acute bony abnormalities. Bilateral hips demonstrate normal alignment. Degenerative changes in the hips. Surgical clips in the pelvis. LUMBAR SPINE: Degenerative changes in the lower lumbar spine. SOFT TISSUES: Diffuse vascular calcifications. IMPRESSION: 1. No acute abnormality of the bilateral hips. 2.  Degenerative changes in the lower lumbar spine and hips. Electronically signed by: Elsie Gravely MD 10/12/2024 09:38 PM EST RP Workstation: [id number]   CT HEAD WO CONTRAST Result Date: 10/12/2024 EXAM: CT HEAD AND CERVICAL SPINE 10/12/2024 07:44:37 PM TECHNIQUE: CT of the head and cervical spine was performed without the administration of intravenous contrast. Multiplanar reformatted images are provided for review. Automated exposure control, iterative reconstruction, and/or weight based adjustment of the mA/kV was utilized to reduce the radiation dose to as low as reasonably achievable. COMPARISON: CT head and CT cervical spine 09/06/2024 CLINICAL HISTORY: fall FINDINGS: CT HEAD  BRAIN AND VENTRICLES: No acute intracranial hemorrhage. No mass effect or midline shift. No abnormal extra-axial fluid collection. No evidence of acute infarct. No hydrocephalus. ORBITS: No acute abnormality. SINUSES AND MASTOIDS: No acute abnormality. SOFT TISSUES AND SKULL: No acute skull fracture. No acute soft tissue abnormality. CT CERVICAL SPINE BONES AND ALIGNMENT: No acute fracture or traumatic malalignment. Unchanged anterolisthesis of C2 on C3 and reversal of the normal cervical lordosis. Solid C3-C4 interbody fusion. DEGENERATIVE CHANGES: Facet and uncovertebral hypertrophy with varying degrees of neural foraminal stenosis.[id number] SOFT TISSUES: No prevertebral soft tissue swelling. IMPRESSION: 1. No acute intracranial abnormality. 2. No acute fracture or traumatic malalignment of the cervical spine. Electronically signed by: Gilmore Molt MD 10/12/2024 08:09 PM EST RP Workstation: [id number]   CT Cervical Spine Wo Contrast Result Date: 10/12/2024 EXAM: CT HEAD AND CERVICAL SPINE 10/12/2024 07:44:37 PM TECHNIQUE: CT of the head and cervical spine was performed without the administration of intravenous contrast. Multiplanar reformatted images are provided for review. Automated exposure control, iterative  reconstruction, and/or weight based adjustment of the mA/kV was utilized to reduce the radiation dose to as low as reasonably achievable. COMPARISON: CT head and CT cervical spine 09/06/2024 CLINICAL HISTORY: fall FINDINGS: CT HEAD BRAIN AND VENTRICLES: No acute intracranial hemorrhage. No mass effect or midline shift. No abnormal extra-axial fluid collection. No evidence of acute infarct. No hydrocephalus. ORBITS: No acute abnormality. SINUSES AND MASTOIDS: No acute abnormality. SOFT TISSUES AND SKULL: No acute skull fracture. No acute soft tissue abnormality. CT CERVICAL SPINE BONES AND ALIGNMENT: No acute fracture or traumatic malalignment. Unchanged anterolisthesis of C2 on C3 and reversal of the normal cervical lordosis. Solid C3-C4 interbody fusion. DEGENERATIVE CHANGES: Facet and uncovertebral hypertrophy with varying degrees of neural foraminal stenosis.jdiqdijdijq SOFT TISSUES: No prevertebral soft tissue swelling. IMPRESSION: 1. No acute intracranial abnormality. 2. No acute fracture or traumatic malalignment of the cervical spine. Electronically signed by: Gilmore Molt MD 10/12/2024 08:09 PM EST RP Workstation: [id number]   DG Chest Port 1 View Result Date: 10/12/2024 EXAM: 1 VIEW(S) XRAY OF THE CHEST 10/12/2024 06:29:00 PM COMPARISON: Prior study. CLINICAL HISTORY: Questionable sepsis - evaluate for abnormality. FINDINGS: LUNGS AND PLEURA: Patchy infiltrates in the lung bases are new since the prior study and may represent atelectasis or pneumonia. No pleural effusion or pneumothorax. HEART AND MEDIASTINUM: Heart size and pulmonary vascularity are normal. Mediastinal contours appear intact. Postoperative changes in the mediastinum. BONES AND SOFT TISSUES: Degenerative changes in the spine and shoulders. No acute osseous abnormality. IMPRESSION: 1. New patchy bibasilar airspace opacities, which may represent atelectasis or pneumonia. No pleural effusion or pneumothorax. Electronically signed by:  Elsie Gravely MD 10/12/2024 06:32 PM EST RP Workstation: [id number]        Scheduled Meds:  amLODipine   5 mg Oral Daily   aspirin  EC  81 mg Oral Daily   atorvastatin   80 mg Oral Q breakfast   budesonide -glycopyrrolate -formoterol   2 puff Inhalation Daily   cetirizine   10 mg Oral QHS   clopidogrel   75 mg Oral Daily   colchicine   0.6 mg Oral Daily   enoxaparin  (LOVENOX ) injection  40 mg Subcutaneous Q24H   famotidine   40 mg Oral Daily   Fe Fum-Vit C-Vit B12-FA  1 capsule Oral QPC breakfast   FLUoxetine   40 mg Oral Daily   isosorbide  mononitrate  30 mg Oral Daily   linaclotide   290 mcg Oral QAC breakfast   montelukast   10 mg Oral Daily   multivitamin with minerals  1 tablet Oral Daily  oxyCODONE   15 mg Oral Q4H   pantoprazole   40 mg Oral BID AC   potassium chloride  SA  20 mEq Oral BID   rOPINIRole   1 mg Oral QHS   spironolactone   25 mg Oral Daily   tamsulosin   0.4 mg Oral Daily   Continuous Infusions:  azithromycin  500 mg (10/13/24 0102)   cefTRIAXone  (ROCEPHIN )  IV 2 g (10/13/24 9379)   lactated ringers  150 mL/hr (10/13/24 0052)     LOS: 1 day    Time spent: 35 minutes    Lanee Chain Maree, MD [facility] Pager [phone]  If 7PM-7AM, please contact night-coverage [email or web address]  10/13/2024, 2:53 PM

## 2024-10-13 NOTE — Evaluation (Signed)
 Occupational Therapy Evaluation Patient Details Name: Bradley Sawyer MRN: [id number] DOB: [birth year] Today's Date: 10/13/2024   History of Present Illness   68 y.o. male with medical history significant of essential hypertension, GERD, COPD, history of CVA, hyperlipidemia, osteoarthritis, anxiety disorder, iron deficiency anemia, gout, who presents to the ER with shortness of breath cough and fever     Clinical Impressions Pt was seen for OT evaluation this date. Prior to hospital admission, pt was mostly MODI, receiving assistance from family for cooking. Pt reports no DME use PTA. Will need to confirm PLOF and support with family prior to DC. Pt lives with his children in mobile home with 3 steps to enter. Pt presents with deficits in decreased Ind in self care,balance, functional mobility/transfers, activity tolerance, and safety awareness affecting safe and optimal ADL completion. Pt currently requires MINA for bed mobility, MINA for STS from lowest bed height with verbal cues for hand placement. Pt took lateral steps up the head of bed with close CGA, pt limited ADL/function mobility training on this date due to pt having difficulties keeping eyes open. Pt often dropped his head back briefly falling asleep, when asked to try to keep his eyes open pt reports, I am awake. RN aware. Orthostatic vitals taken during session were negative. Pt would benefit from skilled OT services to address noted impairments and functional limitations (see below for any additional details) in order to maximize safety and independence while minimizing future risk of falls, injury, and readmission. OT will follow acutely.    If plan is discharge home, recommend the following:   A little help with walking and/or transfers;A little help with bathing/dressing/bathroom;Assistance with cooking/housework;Direct supervision/assist for medications management;Direct supervision/assist for financial management;Assist for  transportation     Functional Status Assessment   Patient has had a recent decline in their functional status and demonstrates the ability to make significant improvements in function in a reasonable and predictable amount of time.     Equipment Recommendations   BSC/3in1     Recommendations for Other Services         Precautions/Restrictions   Precautions Precautions: Fall Recall of Precautions/Restrictions: Impaired Required Braces or Orthoses: Other Brace Other Brace: Post op shoe Restrictions Weight Bearing Restrictions Per Provider Order: Yes RLE Weight Bearing Per Provider Order: Weight bearing as tolerated     Mobility Bed Mobility Overal bed mobility: Needs Assistance Bed Mobility: Supine to Sit, Sit to Supine     Supine to sit: Min assist Sit to supine: Min assist   General bed mobility comments: LE support and cue for sequencing    Transfers Overall transfer level: Needs assistance Equipment used: Rolling walker (2 wheels) Transfers: Sit to/from Stand Sit to Stand: Min assist           General transfer comment: Took lateral steps up the head of bed with RW support, limited mobility as pt was recieiving blood      Balance Overall balance assessment: Needs assistance, History of Falls Sitting-balance support: Feet supported, Bilateral upper extremity supported Sitting balance-Leahy Scale: Fair Sitting balance - Comments: Falling forward per lethargy   Standing balance support: Reliant on assistive device for balance, Bilateral upper extremity supported, During functional activity Standing balance-Leahy Scale: Fair Standing balance comment: Heavy reliance on RW in standing                           ADL either performed or assessed with clinical judgement  ADL Overall ADL's : Needs assistance/impaired Eating/Feeding: Set up;Sitting   Grooming: Wash/dry face;Set up;Sitting           Upper Body Dressing : Moderate  assistance;Sitting   Lower Body Dressing: Maximal assistance;Sitting/lateral leans               Functional mobility during ADLs: Rolling walker (2 wheels);Minimal assistance General ADL Comments: MAXA LB dressing of post op shoe     Vision         Perception         Praxis         Pertinent Vitals/Pain Pain Assessment Pain Assessment: 0-10 Pain Score: 6  Pain Location: hips Pain Descriptors / Indicators: Constant Pain Intervention(s): Limited activity within patient's tolerance, Monitored during session, Premedicated before session, Repositioned     Extremity/Trunk Assessment Upper Extremity Assessment Upper Extremity Assessment: Generalized weakness   Lower Extremity Assessment Lower Extremity Assessment: Defer to PT evaluation;RLE deficits/detail   Cervical / Trunk Assessment Cervical / Trunk Assessment: Normal   Communication Communication Communication: Impaired Factors Affecting Communication: Hearing impaired;Reduced clarity of speech   Cognition Arousal: Lethargic Behavior During Therapy: Flat affect Cognition: No family/caregiver present to determine baseline, Cognition impaired   Orientation impairments: Time Awareness: Intellectual awareness impaired, Online awareness impaired Memory impairment (select all impairments): Short-term memory Attention impairment (select first level of impairment): Sustained attention Executive functioning impairment (select all impairments): Initiation, Problem solving, Reasoning, Sequencing, Organization                   Following commands: Impaired Following commands impaired: Follows one step commands with increased time     Cueing  General Comments   Cueing Techniques: Verbal cues;Tactile cues      Exercises Exercises: Other exercises Other Exercises Other Exercises: Edu: Role of OT eval, safe ADL completion, discharge planning   Shoulder Instructions      Home Living Family/patient  expects to be discharged to:: Private residence Living Arrangements: Children Available Help at Discharge: Family;Available 24 hours/day Type of Home: Mobile home Home Access: Stairs to enter Entrance Stairs-Number of Steps: 3 Entrance Stairs-Rails: Right Home Layout: One level     Bathroom Shower/Tub: Chief Strategy Officer: Standard     Home Equipment: Agricultural Consultant (2 wheels);Cane - single point;Crutches   Additional Comments: Pt is on 3.5-4Ls at baseline      Prior Functioning/Environment Prior Level of Function : Independent/Modified Independent             Mobility Comments: using cane as needed ADLs Comments: Family cooks    OT Problem List: Decreased strength;Decreased activity tolerance;Impaired balance (sitting and/or standing);Decreased coordination;Decreased safety awareness;Decreased knowledge of use of DME or AE;Decreased knowledge of precautions   OT Treatment/Interventions: Self-care/ADL training;Therapeutic exercise;Energy conservation;DME and/or AE instruction;Therapeutic activities;Patient/family education;Balance training      OT Goals(Current goals can be found in the care plan section)   Acute Rehab OT Goals Patient Stated Goal: Feel better OT Goal Formulation: With patient Time For Goal Achievement: 10/27/24 Potential to Achieve Goals: Fair ADL Goals Pt Will Perform Grooming: standing;with min assist Pt Will Perform Lower Body Dressing: with min assist;sitting/lateral leans Pt Will Transfer to Toilet: ambulating;with min assist Pt Will Perform Toileting - Clothing Manipulation and hygiene: with min assist;sitting/lateral leans   OT Frequency:  Min 2X/week    Co-evaluation              AM-PAC OT 6 Clicks Daily Activity     Outcome Measure  Help from another person eating meals?: A Little Help from another person taking care of personal grooming?: A Little Help from another person toileting, which includes using  toliet, bedpan, or urinal?: A Little Help from another person bathing (including washing, rinsing, drying)?: A Lot Help from another person to put on and taking off regular upper body clothing?: A Little Help from another person to put on and taking off regular lower body clothing?: A Lot 6 Click Score: 16   End of Session Equipment Utilized During Treatment: Gait belt;Rolling walker (2 wheels);Oxygen Nurse Communication: Mobility status  Activity Tolerance: Patient limited by lethargy Patient left: in bed;with call bell/phone within reach;with bed alarm set  OT Visit Diagnosis: Unsteadiness on feet (R26.81);Other abnormalities of gait and mobility (R26.89);Repeated falls (R29.6);Muscle weakness (generalized) (M62.81);History of falling (Z91.81)                Time: 8460-8440 OT Time Calculation (min): 20 min Charges:  OT General Charges $OT Visit: 1 Visit OT Evaluation $OT Eval Moderate Complexity: 1 Mod OT Treatments $Self Care/Home Management : 8-22 mins  Larraine Colas M.S. OTR/L  10/13/24, 5:05 PM

## 2024-10-14 ENCOUNTER — Inpatient Hospital Stay

## 2024-10-14 DIAGNOSIS — N179 Acute kidney failure, unspecified: Secondary | ICD-10-CM | POA: Diagnosis not present

## 2024-10-14 DIAGNOSIS — I10 Essential (primary) hypertension: Secondary | ICD-10-CM | POA: Diagnosis not present

## 2024-10-14 DIAGNOSIS — I2510 Atherosclerotic heart disease of native coronary artery without angina pectoris: Secondary | ICD-10-CM | POA: Diagnosis not present

## 2024-10-14 DIAGNOSIS — J189 Pneumonia, unspecified organism: Secondary | ICD-10-CM | POA: Diagnosis not present

## 2024-10-14 LAB — TSH: TSH: 1.33 u[IU]/mL (ref 0.350–4.500)

## 2024-10-14 LAB — BPAM RBC
Blood Product Expiration Date: 202512272359
ISSUE DATE / TIME: 202511221302
Unit Type and Rh: 5100

## 2024-10-14 LAB — CBC
HCT: 25 % — ABNORMAL LOW (ref 39.0–52.0)
Hemoglobin: 7.8 g/dL — ABNORMAL LOW (ref 13.0–17.0)
MCH: 26.7 pg (ref 26.0–34.0)
MCHC: 31.2 g/dL (ref 30.0–36.0)
MCV: 85.6 fL (ref 80.0–100.0)
Platelets: 239 K/uL (ref 150–400)
RBC: 2.92 MIL/uL — ABNORMAL LOW (ref 4.22–5.81)
RDW: 15.9 % — ABNORMAL HIGH (ref 11.5–15.5)
WBC: 5.3 K/uL (ref 4.0–10.5)
nRBC: 0 % (ref 0.0–0.2)

## 2024-10-14 LAB — RETICULOCYTES
Immature Retic Fract: 17.6 % — ABNORMAL HIGH (ref 2.3–15.9)
RBC.: 3.09 MIL/uL — ABNORMAL LOW (ref 4.22–5.81)
Retic Count, Absolute: 34.9 K/uL (ref 19.0–186.0)
Retic Ct Pct: 1.1 % (ref 0.4–3.1)

## 2024-10-14 LAB — COMPREHENSIVE METABOLIC PANEL WITH GFR
ALT: 12 U/L (ref 0–44)
AST: 23 U/L (ref 15–41)
Albumin: 3 g/dL — ABNORMAL LOW (ref 3.5–5.0)
Alkaline Phosphatase: 89 U/L (ref 38–126)
Anion gap: 5 (ref 5–15)
BUN: 9 mg/dL (ref 8–23)
CO2: 37 mmol/L — ABNORMAL HIGH (ref 22–32)
Calcium: 8.6 mg/dL — ABNORMAL LOW (ref 8.9–10.3)
Chloride: 99 mmol/L (ref 98–111)
Creatinine, Ser: 0.77 mg/dL (ref 0.61–1.24)
GFR, Estimated: >60 mL/min (ref >60)
Glucose, Bld: 91 mg/dL (ref 70–99)
Potassium: 3.2 mmol/L — ABNORMAL LOW (ref 3.5–5.1)
Sodium: 140 mmol/L (ref 135–145)
Total Bilirubin: 0.3 mg/dL (ref 0.0–1.2)
Total Protein: 5.8 g/dL — ABNORMAL LOW (ref 6.5–8.1)

## 2024-10-14 LAB — TYPE AND SCREEN
ABO/RH(D): O POS
Antibody Screen: NEGATIVE
Unit division: 0

## 2024-10-14 LAB — BLOOD GAS, VENOUS
Bicarbonate: 43.1 mmol/L — ABNORMAL HIGH (ref 20.0–28.0)
Patient temperature: 37 mmol/L — AB (ref 0.0–2.0)
pCO2, Ven: 68 mmHg — ABNORMAL HIGH (ref 44–60)
pH, Ven: 7.41 (ref 7.25–7.43)
pO2, Ven: 43.1 mmHg — AB (ref 32–45)

## 2024-10-14 LAB — IRON AND TIBC
Iron: 25 ug/dL — ABNORMAL LOW (ref 45–182)
Saturation Ratios: 11 % — ABNORMAL LOW (ref 17.9–39.5)
TIBC: 227 ug/dL — ABNORMAL LOW (ref 250–450)
UIBC: 201 ug/dL

## 2024-10-14 LAB — MAGNESIUM: Magnesium: 1.9 mg/dL (ref 1.7–2.4)

## 2024-10-14 LAB — FERRITIN: Ferritin: 80 ng/mL (ref 24–336)

## 2024-10-14 LAB — FOLATE: Folate: 12.6 ng/mL (ref >5.9)

## 2024-10-14 LAB — SEDIMENTATION RATE: Sed Rate: 71 mm/h — ABNORMAL HIGH (ref 0–20)

## 2024-10-14 NOTE — Consult Note (Signed)
 Consultation Note Date: 10/14/2024   Patient Name: Bradley Sawyer  DOB: 1956-09-01  MRN: [id number]  Age / Sex: 68 y.o., male   PCP: Corrington, Coye LABOR, MD Referring Physician: Maree Hue, MD  Reason for Consultation: Establishing goals of care     Chief Complaint/History of Present Illness:  Bradley Sawyer is a 68 year old male with history of essential hypertension, GERD, COPD, CVA, hyperlipidemia, osteoarthritis, anxiety disorder, iron deficiency anemia, gout, chronic pain on opioid therapy who presented to the ER with shortness of breath, cough and fever.  He was admitted for sepsis due to pneumonia and started on antibiotics.  He also had AKI that is resolved with hydration.  Palliative consulted for goals of care.  Chart reviewed including personal review notes from hospitalist, therapy, and TOC.  Labs this morning reveal potassium 3.2, sodium 140, creatinine 0.77, white blood count 5.3, hemoglobin 7.8 following unit of blood.  Imaging reviewed including chest x-ray that reveals patchy infiltrates.  CT with signs of previous C3-C4's fusion.  X-rays of hips with no acute fracture noted.  I saw and examined Bradley Sawyer today.  I introduced palliative care as specialized medical care for people living with serious illness. It focuses on providing relief from the symptoms and stress of a serious illness. The goal is to improve quality of life for both the patient and the family.  We discussed clinical course as well as wishes moving forward in regard to advanced directives.  Discussed that he was confused on arrival to hospital and knowing/documenting his wishes is important to make sure his care is aligned with his goals in the future.  Concepts specific to code status and rehospitalization discussed.  Values and goals of care important to patient and family were attempted to be elicited.  He reports that his family is the most important thing to him.  He is divorced.  He lives with his son,  daughter-in-law, and their 4 children.  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  He is now disabled following a lawnmower accident that resulted in a back fracture requiring surgery including screws and plates.  He has chronic pain related to this. Bradley Sawyer is Bradley Sawyer tradition, raised [employer]. Faith is important to him but not part of any church/faith community.   We discussed surrogate decision making in [place] .  He understands that his son will be his legal surrogate if he is not able to make decisions.  He reports this who he would choose to make decisions on his behalf.  He declined to consider filling out HCPOA documentation.  In discussing goals of care, he reports that he would want any offered intervention to keep him alive.  When asked if there are any limitations  To the type of care that he would want (i.e. tracheostomy, dialysis), he reports my son will know what to do.  He is hopeful to return home at discharge but would be agreeable to short term rehab if recommended.  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   Compression fracture    L2   COPD (chronic obstructive pulmonary disease) (HCC)    bronchitis   CVA (cerebral infarction)    Disorder of vocal cord    Dyspnea    Fatty liver    GERD (gastroesophageal reflux disease)    Hx of cardiovascular stress test     ETT/Lexiscan -Myoview (10/15):  Inferior, inferoapical, apical lateral defect c/w mild ischemia, EF 66%; Intermediate Risk   Hyperlipidemia    Hypertension    Right shoulder pain    2/2 partial rotator cuff tear, tendinopathy, mild subacromial/subdeltioid bursitis, A/C joint arthropathy per MRI done in 3/07   Stroke (HCC) 2014   Tobacco abuse    Social History   Socioeconomic History   Marital status: Divorced    Spouse name: Not on file   Number of children: 1   Years of education: GED   Highest education level: Not on file  Occupational History   Occupation: Disabled  Tobacco Use   Smoking status: Former    Current packs/day: 0.00    Average packs/day: 2.0 packs/day for 48.0 years (96.0 ttl pk-yrs)    Types: Cigarettes    Start date: 11/22/1972    Quit date: 11/22/2020    Years since quitting: 3.8   Smokeless tobacco: Never  Vaping Use   Vaping status: Never Used  Substance and Sexual Activity   Alcohol use: No    Alcohol/week: 0.0 standard drinks of alcohol   Drug use: No    Types: Cocaine, Marijuana    Comment: no recent use   Sexual activity: Not Currently  Other Topics Concern   Not on file  Social History Narrative   Patient is single with one child.   Patient is right handed.   Patient has his GED.   Patient drinks 5 or more cups daily.   Social Drivers of Corporate Investment Banker Strain: Low Risk  (12/19/2023)   Received from [facility]   Overall Financial Resource Strain (CARDIA)    Difficulty of Paying Living Expenses: Not hard at all  Food Insecurity: No Food Insecurity (10/12/2024)   Hunger Vital Sign    Worried About Running Out of Food in the Last Year: Never true    Ran Out of Food in the Last Year: Never true  Transportation Needs: No Transportation Needs (10/12/2024)   PRAPARE - Administrator, Civil Service (Medical): No    Lack of Transportation (Non-Medical): No  Physical Activity: Unknown (10/31/2023)   Received from [facility]   Exercise Vital Sign    On average, how many days per week do you engage in moderate to strenuous exercise (like a brisk walk)?: 0 days    Minutes of Exercise per Session: Not on file  Stress: No Stress Concern Present (10/31/2023)   Received from [facility] - Occupational Stress Questionnaire    Feeling of Stress : Only a little  Social Connections: Socially Isolated (10/12/2024)   Social Connection and Isolation Panel    Frequency of Communication with Friends and Family: More than three times a week    Frequency of Social Gatherings with Friends and Family: More than three times a week    Attends Religious Services: Never    Database Administrator or Organizations: No    Attends Banker Meetings: Never    Marital Status:  Divorced   Family History  Problem Relation Age of Onset   Heart disease Father    Heart attack Father    Diabetes Paternal Grandmother        1 st degree relatives   Hypertension Paternal Grandmother    Diabetes Brother    Heart disease Brother    Muscular dystrophy Brother    Muscular dystrophy Sister    Prostate cancer Neg Hx    Kidney cancer Neg Hx    Bladder Cancer Neg Hx    Scheduled Meds:  amLODipine   5 mg Oral Daily   aspirin  EC  81 mg Oral Daily   atorvastatin   80 mg Oral Q breakfast   budesonide -glycopyrrolate -formoterol   2 puff Inhalation Daily   cetirizine   10 mg Oral QHS   clopidogrel   75 mg Oral Daily   colchicine   0.6 mg Oral Daily   enoxaparin  (LOVENOX ) injection  40 mg Subcutaneous Q24H   famotidine   40 mg Oral Daily   Fe Fum-Vit C-Vit B12-FA  1 capsule Oral QPC breakfast   FLUoxetine   40 mg Oral Daily   isosorbide  mononitrate  30 mg Oral Daily   linaclotide   290 mcg Oral QAC breakfast   montelukast   10 mg Oral Daily   multivitamin with minerals  1 tablet Oral Daily   oxyCODONE   15 mg Oral Q4H   pantoprazole   40 mg Oral BID AC   potassium chloride  SA  20 mEq Oral  BID   rOPINIRole   1 mg Oral QHS   spironolactone   25 mg Oral Daily   tamsulosin   0.4 mg Oral Daily   Continuous Infusions:  sodium chloride  10 mL/hr at 10/13/24 2124   azithromycin  500 mg (10/13/24 2125)   cefTRIAXone  (ROCEPHIN )  IV 2 g (10/14/24 0611)   PRN Meds:.sodium chloride , albuterol , cyclobenzaprine , ipratropium-albuterol , nitroGLYCERIN , ondansetron , polyethylene glycol Allergies  Allergen Reactions   Bee Venom Swelling   Tapentadol Shortness Of Breath    Other Reaction(s): sob/tachycardia   CBC:    Component Value Date/Time   WBC 5.3 10/14/2024 0534   HGB 7.8 (L) 10/14/2024 0534   HGB 15.1 07/01/2013 1441   HCT 25.0 (L) 10/14/2024 0534   HCT 42.6 07/01/2013 1441   PLT 239 10/14/2024 0534   PLT 209 07/01/2013 1441   MCV 85.6 10/14/2024 0534   MCV 90 07/01/2013 1441   NEUTROABS 7.0 10/12/2024 1845   LYMPHSABS 1.8 10/12/2024 1845   MONOABS 0.9 10/12/2024 1845   EOSABS 0.1 10/12/2024 1845   BASOSABS 0.0 10/12/2024 1845   Comprehensive Metabolic Panel:    Component Value Date/Time   NA 140 10/14/2024 0534   NA 136 07/01/2013 1441   K 3.2 (L) 10/14/2024 0534   K 3.2 (L) 07/01/2013 1441   CL 99 10/14/2024 0534   CL 100 07/01/2013 1441   CO2 37 (H) 10/14/2024 0534   CO2 31 07/01/2013 1441   BUN 9 10/14/2024 0534   BUN 17 07/01/2013 1441   CREATININE 0.77 10/14/2024 0534   CREATININE 0.82 02/11/2015 1413   GLUCOSE 91 10/14/2024 0534   GLUCOSE 106 (H) 07/01/2013 1441   CALCIUM  8.6 (L) 10/14/2024 0534   CALCIUM  9.5 07/01/2013 1441   AST 23 10/14/2024 0534   AST 199 (H) 06/27/2013 2105   ALT 12 10/14/2024 0534   ALT 217 (H) 06/27/2013 2105   ALKPHOS 89 10/14/2024 0534   ALKPHOS 215 (H) 06/27/2013 2105   BILITOT 0.3 10/14/2024 0534   BILITOT 1.3 (H) 06/27/2013 2105   PROT  5.8 (L) 10/14/2024 0534   PROT 8.2 06/27/2013 2105   ALBUMIN 3.0 (L) 10/14/2024 0534   ALBUMIN 3.6 06/27/2013 2105    Physical Exam: Vital Signs: BP 135/73   Pulse 69   Temp 98.3 F  (36.8 C)   Resp 17   Ht 5' 11 (1.803 m)   Wt 84.7 kg   SpO2 98%   BMI 26.04 kg/m  SpO2: SpO2: 98 % O2 Device: O2 Device: Nasal Cannula O2 Flow Rate: O2 Flow Rate (L/min): 4 L/min Intake/output summary:  Intake/Output Summary (Last 24 hours) at 10/14/2024 1136 Last data filed at 10/14/2024 0955 Gross per 24 hour  Intake 932.17 ml  Output 2050 ml  Net -1117.83 ml   LBM: Last BM Date : 10/11/24 Baseline Weight: Weight: 84.7 kg Most recent weight: Weight: 84.7 kg  General: NAD, alert Eyes: conjunctiva clear, anicteric sclera HENT: normocephalic, atraumatic, moist mucous membranes Cardiovascular: RRR Respiratory: cough, rhonchi Abdomen: not distended Skin: no rashes or lesions on visible skin Neuro: A&O, following commands easily Psych: appropriately answers all questions         Additional Data Reviewed: Recent Labs    10/13/24 0438 10/14/24 0534  WBC 5.7 5.3  HGB 6.7* 7.8*  PLT 197 239  NA 139 140  BUN 15 9  CREATININE 1.19 0.77    Imaging: DG HIPS BILAT WITH PELVIS MIN 5 VIEWS EXAM: 5 VIEW(S) XRAY OF THE PELVIS AND BILATERAL HIP 10/12/2024 09:35:11 PM  COMPARISON: None available.  CLINICAL HISTORY: [id number] Fall [id number]  FINDINGS:  BONES AND JOINTS: SI joints are symmetric. No acute bony abnormalities. Bilateral hips demonstrate normal alignment. Degenerative changes in the hips. Surgical clips in the pelvis.  LUMBAR SPINE: Degenerative changes in the lower lumbar spine.  SOFT TISSUES: Diffuse vascular calcifications.  IMPRESSION: 1. No acute abnormality of the bilateral hips. 2. Degenerative changes in the lower lumbar spine and hips.  Electronically signed by: Elsie Gravely MD 10/12/2024 09:38 PM EST RP Workstation: [id number] CT HEAD WO CONTRAST EXAM: CT HEAD AND CERVICAL SPINE 10/12/2024 07:44:37 PM  TECHNIQUE: CT of the head and cervical spine was performed without the administration of intravenous contrast. Multiplanar  reformatted images are provided for review. Automated exposure control, iterative reconstruction, and/or weight based adjustment of the mA/kV was utilized to reduce the radiation dose to as low as reasonably achievable.  COMPARISON: CT head and CT cervical spine 09/06/2024  CLINICAL HISTORY: fall  FINDINGS: CT HEAD  BRAIN AND VENTRICLES: No acute intracranial hemorrhage. No mass effect or midline shift. No abnormal extra-axial fluid collection. No evidence of acute infarct. No hydrocephalus.  ORBITS: No acute abnormality.  SINUSES AND MASTOIDS: No acute abnormality.  SOFT TISSUES AND SKULL: No acute skull fracture. No acute soft tissue abnormality.  CT CERVICAL SPINE  BONES AND ALIGNMENT: No acute fracture or traumatic malalignment. Unchanged anterolisthesis of C2 on C3 and reversal of the normal cervical lordosis. Solid C3-C4 interbody fusion.  DEGENERATIVE CHANGES: Facet and uncovertebral hypertrophy with varying degrees of neural foraminal stenosis.[id number]  SOFT TISSUES: No prevertebral soft tissue swelling.  IMPRESSION: 1. No acute intracranial abnormality. 2. No acute fracture or traumatic malalignment of the cervical spine.  Electronically signed by: Gilmore Molt MD 10/12/2024 08:09 PM EST RP Workstation: [id number] CT Cervical Spine Wo Contrast EXAM: CT HEAD AND CERVICAL SPINE 10/12/2024 07:44:37 PM  TECHNIQUE: CT of the head and cervical spine was performed without the administration of intravenous contrast. Multiplanar reformatted images are provided for review. Automated exposure control, iterative reconstruction,  and/or weight based adjustment of the mA/kV was utilized to reduce the radiation dose to as low as reasonably achievable.  COMPARISON: CT head and CT cervical spine 09/06/2024  CLINICAL HISTORY: fall  FINDINGS: CT HEAD  BRAIN AND VENTRICLES: No acute intracranial hemorrhage. No mass effect or midline shift. No  abnormal extra-axial fluid collection. No evidence of acute infarct. No hydrocephalus.  ORBITS: No acute abnormality.  SINUSES AND MASTOIDS: No acute abnormality.  SOFT TISSUES AND SKULL: No acute skull fracture. No acute soft tissue abnormality.  CT CERVICAL SPINE  BONES AND ALIGNMENT: No acute fracture or traumatic malalignment. Unchanged anterolisthesis of C2 on C3 and reversal of the normal cervical lordosis. Solid C3-C4 interbody fusion.  DEGENERATIVE CHANGES: Facet and uncovertebral hypertrophy with varying degrees of neural foraminal stenosis.jdiqdijdijq  SOFT TISSUES: No prevertebral soft tissue swelling.  IMPRESSION: 1. No acute intracranial abnormality. 2. No acute fracture or traumatic malalignment of the cervical spine.  Electronically signed by: Gilmore Molt MD 10/12/2024 08:09 PM EST RP Workstation: [id number] DG Chest Port 1 View EXAM: 1 VIEW(S) XRAY OF THE CHEST 10/12/2024 06:29:00 PM  COMPARISON: Prior study.  CLINICAL HISTORY: Questionable sepsis - evaluate for abnormality.  FINDINGS:  LUNGS AND PLEURA: Patchy infiltrates in the lung bases are new since the prior study and may represent atelectasis or pneumonia. No pleural effusion or pneumothorax.  HEART AND MEDIASTINUM: Heart size and pulmonary vascularity are normal. Mediastinal contours appear intact. Postoperative changes in the mediastinum.  BONES AND SOFT TISSUES: Degenerative changes in the spine and shoulders. No acute osseous abnormality.  IMPRESSION: 1. New patchy bibasilar airspace opacities, which may represent atelectasis or pneumonia. No pleural effusion or pneumothorax.  Electronically signed by: Elsie Gravely MD 10/12/2024 06:32 PM EST RP Workstation: [id number]    I personally reviewed recent imaging.   Palliative Care Assessment and Plan Summary of Established Goals of Care and Medical Treatment Preferences    # Complex medical decision making/goals of  care  -  Desires continuation of any offered interventions.  -  Code Status: Full Code   - Son would be his surrogate decision maker.  # Symptom management Patient is receiving these palliative interventions for symptom management with an intent to improve quality of life.   -Chronic pain: Chronic pain management without a cancer diagnosis as primary etiology of pain is outside the scope of palliative care.  Unfortunately, [facility] does not have an inpatient pain service at this time.   -I did review his PDMP and print a copy to place on his hard chart.  He appears to be on stable dose of oxycodone  15mg  tabs with 180 tablets filled every 30 days (6 tabs per day).  Script last filled 09/29/2024 and written by Kip Corrington, MD in [place], [place].  He has been on this regimen since at least March with Dr. Bobbette consistently managing.  Would consider maintaining his home regimen during hospitalization, following up with Dr. Bobbette at discharge, and reaching out to Dr. Bobbette to collaborate if changes to his regimen are needed while he is inpatient.    # Psycho-social/Spiritual Support:  - Support System: Son, DIL - Desire for further Chaplain support:no  # Discharge Planning:  To Be Determined Home vs rehab  Thank you for allowing the palliative care team to participate in the care Jayson SAUNDERS [place].  Amaryllis Meissner, MD Palliative Care Provider PMT # [phone]  If patient remains symptomatic despite maximum doses, please call PMT at [phone] between 0700 and 1900. Outside  of these hours, please call attending, as PMT does not have night coverage.   I personally spent a total of 78 minutes in the care of the patient today including preparing to see the patient, getting/reviewing separately obtained history, performing a medically appropriate exam/evaluation, referring and communicating with other health care professionals, documenting clinical information in the EHR, independently  interpreting results, and communicating results.

## 2024-10-14 NOTE — Consult Note (Signed)
 PHARMACY CONSULT NOTE - ELECTROLYTES  Pharmacy Consult for Electrolyte Monitoring and Replacement   Recent Labs: Height: 5' 11 (180.3 cm) Weight: 84.7 kg (186 lb 11.7 oz) IBW/kg (Calculated) : 75.3 Estimated Creatinine Clearance: 94.1 mL/min (by C-G formula based on SCr of 0.77 mg/dL). Potassium (mmol/L)  Date Value  10/14/2024 3.2 (L)  07/01/2013 3.2 (L)   Magnesium  (mg/dL)  Date Value  88/76/7974 1.9   Calcium  (mg/dL)  Date Value  88/76/7974 8.6 (L)   Calcium , Total (mg/dL)  Date Value  91/89/7985 9.5   Albumin (g/dL)  Date Value  88/76/7974 3.0 (L)  06/27/2013 3.6   Phosphorus (mg/dL)  Date Value  89/83/7974 3.3   Sodium (mmol/L)  Date Value  10/14/2024 140  07/01/2013 136   Corrected Ca: 9.4 mg/dL  Assessment  Bradley Sawyer is a 68 y.o. male presenting with sepsis due to pneumonia. PMH significant for HTN, GERD, COPD, CVA, HLD, OA, and axiety. Pharmacy has been consulted to monitor and replace electrolytes.  Diet: heart healthy MIVF: N/A Pertinent medications: spironolactone   Goal of Therapy: Electrolytes WNL  Plan:  Scr 3.2, patient currently on scheduled Kcl 20 mEq po BID, no additional replacement ordered at this time Check BMP, Mg, Phos with AM labs  Thank you for allowing pharmacy to be a part of this patient's care.  Kayla JULIANNA Blew, PharmD Clinical Pharmacist 10/14/2024 11:52 AM

## 2024-10-14 NOTE — Care Management Important Message (Signed)
 Important Message  Patient Details  Name: Bradley Sawyer MRN: [id number] Date of Birth: 06-20-1956   Important Message Given:  Yes - Medicare IM     Fredonia Casalino W, CMA 10/14/2024, 1:33 PM

## 2024-10-14 NOTE — Evaluation (Signed)
 Physical Therapy Evaluation Patient Details Name: Bradley Sawyer MRN: [id number] DOB: 1955-12-24 Today's Date: 10/14/2024  History of Present Illness  68 y.o. male with medical history significant of essential hypertension, GERD, COPD, history of CVA, hyperlipidemia, osteoarthritis, anxiety disorder, iron deficiency anemia, gout, who presents to the ER with shortness of breath cough and fever  Clinical Impression  Patient noted to be in supine position at PT arrival in room, for an initial PT evaluation due to a decline in functional status, with baseline mobility reported as modI, and currently requiring CGA for transfer and room ambulation. The patient is A&O x 4, presenting with good willingness to work with PT and goals of getting stronger. The patient resides in a mobile home and lives with children with family/friend support. There are 3 STE inside the residence.  Vitals are stable with an SpO? of >90% on 4L/min. Gait was assessed with RW, limited by safety awareness. Therapist, nutritional observations noted reaching with RW and decreased weight shift to R side. The overall clinical impression is that the patient presents with mild mobility limitations secondary to acute medical complications. Recommended skilled PT will address safety, mobility, and discharge planning. PT recommendation to d/c patient to HHPT upon medical clearance.        If plan is discharge home, recommend the following: A little help with walking and/or transfers;A little help with bathing/dressing/bathroom;Help with stairs or ramp for entrance;Assist for transportation   Can travel by private vehicle        Equipment Recommendations Rolling walker (2 wheels)  Recommendations for Other Services       Functional Status Assessment Patient has had a recent decline in their functional status and demonstrates the ability to make significant improvements in function in a reasonable and predictable amount of time.      Precautions / Restrictions Precautions Precautions: Fall Recall of Precautions/Restrictions: Impaired Required Braces or Orthoses: Other Brace Other Brace: Post op shoe Restrictions Weight Bearing Restrictions Per Provider Order: Yes RLE Weight Bearing Per Provider Order: Weight bearing as tolerated      Mobility  Bed Mobility Overal bed mobility: Needs Assistance Bed Mobility: Supine to Sit, Sit to Supine     Supine to sit: Contact guard, Supervision Sit to supine: Contact guard assist, Supervision   General bed mobility comments: LE support and cue for sequencing    Transfers Overall transfer level: Needs assistance Equipment used: Rolling walker (2 wheels) Transfers: Sit to/from Stand Sit to Stand: Min assist, Contact guard assist                Ambulation/Gait Ambulation/Gait assistance: Contact guard assist, Supervision Gait Distance (Feet): 70 Feet Assistive device: Rolling walker (2 wheels) Gait Pattern/deviations: Step-through pattern, Decreased stride length, Decreased weight shift to right       General Gait Details: vc for movement sequence with RW  Stairs            Wheelchair Mobility     Tilt Bed    Modified Rankin (Stroke Patients Only)       Balance Overall balance assessment: Needs assistance, History of Falls Sitting-balance support: Feet supported, Bilateral upper extremity supported Sitting balance-Leahy Scale: Normal     Standing balance support: Reliant on assistive device for balance, Bilateral upper extremity supported, During functional activity Standing balance-Leahy Scale: Fair Standing balance comment: Heavy reliance on RW in standing  Pertinent Vitals/Pain Pain Assessment Pain Location: hips Pain Descriptors / Indicators: Constant Pain Intervention(s): Limited activity within patient's tolerance, Monitored during session, Repositioned    Home Living Family/patient  expects to be discharged to:: Private residence Living Arrangements: Children Available Help at Discharge: Family;Available 24 hours/day Type of Home: Mobile home Home Access: Stairs to enter Entrance Stairs-Rails: Right Entrance Stairs-Number of Steps: 3   Home Layout: One level Home Equipment: Agricultural [employer]ant (2 wheels);Cane - single point;Crutches Additional Comments: Pt is on 3.5-4Ls at baseline    Prior Function Prior Level of Function : Independent/Modified Independent             Mobility Comments: using cane as needed ADLs Comments: Family cooks     Extremity/Trunk Assessment   Upper Extremity Assessment Upper Extremity Assessment: Generalized weakness    Lower Extremity Assessment Lower Extremity Assessment: Generalized weakness;RLE deficits/detail    Cervical / Trunk Assessment Cervical / Trunk Assessment: Normal  Communication   Communication Communication: Impaired Factors Affecting Communication: Hearing impaired;Reduced clarity of speech    Cognition Arousal: Alert Behavior During Therapy: WFL for tasks assessed/performed, Restless   PT - Cognitive impairments: No apparent impairments                         Following commands: Intact Following commands impaired: Follows one step commands with increased time     Cueing Cueing Techniques: Verbal cues, Tactile cues     General Comments      Exercises     Assessment/Plan    PT Assessment Patient needs continued PT services  PT Problem List Decreased strength;Decreased range of motion;Decreased activity tolerance;Decreased balance;Decreased mobility;Decreased knowledge of precautions       PT Treatment Interventions Gait training;Functional mobility training;Therapeutic activities;Therapeutic exercise;Neuromuscular re-education;Patient/family education    PT Goals (Current goals can be found in the Care Plan section)  Acute Rehab PT Goals Patient Stated Goal: Pt wants to go  home PT Goal Formulation: With patient Time For Goal Achievement: 11/04/24 Potential to Achieve Goals: Good    Frequency Min 1X/week     Co-evaluation               AM-PAC PT 6 Clicks Mobility  Outcome Measure Help needed turning from your back to your side while in a flat bed without using bedrails?: None Help needed moving from lying on your back to sitting on the side of a flat bed without using bedrails?: None Help needed moving to and from a bed to a chair (including a wheelchair)?: None Help needed standing up from a chair using your arms (e.g., wheelchair or bedside chair)?: None Help needed to walk in hospital room?: None Help needed climbing 3-5 steps with a railing? : A Little 6 Click Score: 23    End of Session Equipment Utilized During Treatment: Gait belt Activity Tolerance: Patient tolerated treatment well Patient left: in chair;with call bell/phone within reach;with chair alarm set Nurse Communication: Mobility status PT Visit Diagnosis: Other abnormalities of gait and mobility (R26.89);Muscle weakness (generalized) (M62.81);Difficulty in walking, not elsewhere classified (R26.2)    Time: 8447-8389 PT Time Calculation (min) (ACUTE ONLY): 18 min   Charges:   PT Evaluation $PT Eval Low Complexity: 1 Low   PT General Charges $$ ACUTE PT VISIT: 1 Visit         Sherlean Lesches DPT, PT    [place] [facility] Afreen Siebels 10/14/2024, 4:25 PM

## 2024-10-14 NOTE — Progress Notes (Signed)
 1      PROGRESS NOTE    Bradley Sawyer  FMW:[id number] DOB: [birth year] DOA: 10/12/2024 PCP: Bobbette Coye LABOR, MD   Brief Narrative:   68 y.o. male with medical history significant of essential hypertension, GERD, COPD, history of CVA, hyperlipidemia, osteoarthritis, anxiety disorder, iron deficiency anemia, gout, who presents to the ER with shortness of breath cough and fever   11/22: PT and OT consult, palliative care consult 11/23: MRI brain   Assessment & Plan:   Principal Problem:   Sepsis due to pneumonia [facility]) Active Problems:   AKI (acute kidney injury)   Essential hypertension   COPD (chronic obstructive pulmonary disease) (HCC)   CAD (coronary artery disease)   Hyperlipemia   GERD   BPH (benign prostatic hyperplasia)   Hypotension   Chronic respiratory failure with hypoxia (HCC)   PAD (peripheral artery disease)   History of CVA (cerebrovascular accident)   TOBACCO ABUSE   Hypokalemia   #1 sepsis due to pneumonia: Patient met sepsis criteria with initial temperature of 101 at home.  No leukocytosis but has tachycardia.  Evidence of pneumonia.  Patient has severe sepsis with hypotension.  Lactic acid is however 1.4.   - Continue IV Rocephin  and Zithromax .  Initially required 4 L and now being weaned off I had discussion with his POA/[name] who shared that his behaviors are concerning and he is getting aggressive at times which is not normal.  Will get MRI of the brain to rule out any acute pathology.  POA is seeking nursing home placement   #2 severe hypokalemia: Aggressive replacement.  Pharmacy managing electrolytes   #3 AKI: Likely prerenal.  Resolved with hydration   #4 normocytic anemia: Probably due to chronic disease.  Continue to monitor.   #5 essential hypertension: Continue Norvasc , Imdur , Aldactone    #6 COPD: No clear exacerbation.  Continue to monitor   #7 coronary artery disease: Stable at baseline   #8 GERD: Continue PPIs   #9 hyperlipidemia:  Continue with statin, check CK due to recurrent fall   #10 BPH: Continue home regimen  11.  Chronic pain: Patient is receiving Roxicodone  15 mg every 4 hours scheduled.  Will consult palliative care to see if they can help with any pain management.  Patient reports having to follow-up with pain management doctor as an outpatient but has not seen them in his pain is being managed by PCP as per patient   DVT prophylaxis: Lovenox  enoxaparin  (LOVENOX ) injection 40 mg Start: 10/13/24 0800     Code Status: Full code Family Communication: Updated POA/dawn over the phone Disposition Plan: (Possible discharge in next 1-2 days depending on clinical condition and PT, OT eval.  POA is seeking nursing home placement     Antimicrobials:  Rocephin  Azithromycin    Subjective:  Denies any new issues.  Sleepy at times  Objective: Vitals:   10/14/24 0100 10/14/24 0531 10/14/24 0733 10/14/24 1515  BP: 119/60 134/74 135/73 132/72  Pulse: 73 72 69 77  Resp: 18 18 17 16   Temp: 98.4 F (36.9 C) 97.9 F (36.6 C) 98.3 F (36.8 C) 98.2 F (36.8 C)  TempSrc:    Oral  SpO2: 96% 96% 98% 94%  Weight:      Height:        Intake/Output Summary (Last 24 hours) at 10/14/2024 1527 Last data filed at 10/14/2024 0955 Gross per 24 hour  Intake 932.17 ml  Output 1600 ml  Net -667.83 ml   [employer]  10/12/24 2250  Weight: 84.7 kg    Examination:  General exam: Appears calm and comfortable  Respiratory system: Clear to auscultation. Respiratory effort normal. Cardiovascular system: S1 & S2 heard, RRR. No murmurs, rubs, gallops or clicks. No pedal edema. Gastrointestinal system: Abdomen is soft, benign Central nervous system: Sleepy. No focal neurological deficits. Extremities: Symmetric 5 x 5 power. Skin: No rashes, lesions or ulcers Psychiatry: Judgement and insight appear normal. Mood & affect appropriate.     Data Reviewed: I have personally reviewed following labs and imaging  studies  CBC: Recent Labs  Lab 10/11/24 1252 10/12/24 1845 10/13/24 0438 10/14/24 0534  WBC 11.8* 9.8 5.7 5.3  NEUTROABS 8.6* 7.0  --   --   HGB 9.6* 8.5* 6.7* 7.8*  HCT 30.9* 27.6* 21.7* 25.0*  MCV 84.9 85.7 85.1 85.6  PLT 240 244 197 239   Basic Metabolic Panel: Recent Labs  Lab 10/11/24 1252 10/12/24 1845 10/13/24 0438 10/13/24 1119 10/14/24 0534  NA 138 136 139  --  140  K 2.6* 2.7* 2.5* 3.2* 3.2*  CL 93* 90* 96*  --  99  CO2 35* 34* 35*  --  37*  GLUCOSE 106* 106* 92  --  91  BUN 9 18 15   --  9  CREATININE 1.06 1.65* 1.19  --  0.77  CALCIUM  9.0 8.9 8.4*  --  8.6*  MG  --  1.7  --  2.2 1.9   GFR: Estimated Creatinine Clearance: 94.1 mL/min (by C-G formula based on SCr of 0.77 mg/dL). Liver Function Tests: Recent Labs  Lab 10/11/24 1252 10/12/24 1845 10/13/24 0438 10/14/24 0534  AST 27 37 26 23  ALT 23 20 15 12   ALKPHOS 145* 113 90 89  BILITOT 0.8 0.5 0.4 0.3  PROT 7.4 6.8 5.6* 5.8*  ALBUMIN 4.0 3.5 2.9* 3.0*   No results for input(s): LIPASE, AMYLASE in the last 168 hours. No results for input(s): AMMONIA in the last 168 hours. Coagulation Profile: Recent Labs  Lab 10/12/24 1829 10/13/24 0438  INR 1.3* 1.3*    Sepsis Labs: Recent Labs  Lab 10/12/24 1829  LATICACIDVEN 1.4    Recent Results (from the past 240 hours)  Blood Culture (routine x 2)     Status: None (Preliminary result)   Collection Time: 10/12/24  6:29 PM   Specimen: BLOOD  Result Value Ref Range Status   Specimen Description BLOOD LEFT ANTECUBITAL  Final   Special Requests   Final    BOTTLES DRAWN AEROBIC AND ANAEROBIC Blood Culture adequate volume   Culture   Final    NO GROWTH 2 DAYS Performed at [facility], [address] [place], [place] [address]    Report Status PENDING  Incomplete  Blood Culture (routine x 2)     Status: None (Preliminary result)   Collection Time: 10/12/24  6:29 PM   Specimen: BLOOD  Result Value Ref Range Status   Specimen  Description BLOOD RIGHT ANTECUBITAL  Final   Special Requests   Final    BOTTLES DRAWN AEROBIC AND ANAEROBIC Blood Culture adequate volume   Culture   Final    NO GROWTH 2 DAYS Performed at [facility], [address] [place], [place] [address]    Report Status PENDING  Incomplete  Resp panel by RT-PCR (RSV, Flu A&B, Covid) Anterior Nasal Swab     Status: None   Collection Time: 10/12/24  6:45 PM   Specimen: Anterior Nasal Swab  Result Value Ref  Range Status   SARS Coronavirus 2 by RT PCR NEGATIVE NEGATIVE Final    Comment: (NOTE) SARS-CoV-2 target nucleic acids are NOT DETECTED.  The SARS-CoV-2 RNA is generally detectable in upper respiratory specimens during the acute phase of infection. The lowest concentration of SARS-CoV-2 viral copies this assay can detect is 138 copies/mL. A negative result does not preclude SARS-Cov-2 infection and should not be used as the sole basis for treatment or other patient management decisions. A negative result may occur with  improper specimen collection/handling, submission of specimen other than nasopharyngeal swab, presence of viral mutation(s) within the areas targeted by this assay, and inadequate number of viral copies(<138 copies/mL). A negative result must be combined with clinical observations, patient history, and epidemiological information. The expected result is Negative.  Fact Sheet for Patients:  bloggercourse.com  Fact Sheet for Healthcare Providers:  seriousbroker.it  This test is no t yet approved or cleared by the [place]  FDA and  has been authorized for detection and/or diagnosis of SARS-CoV-2 by FDA under an Emergency Use Authorization (EUA). This EUA will remain  in effect (meaning this test can be used) for the duration of the COVID-19 declaration under Section 564(b)(1) of the Act, 21 U.S.C.section 360bbb-3(b)(1), unless the authorization is terminated   or revoked sooner.       Influenza A by PCR NEGATIVE NEGATIVE Final   Influenza B by PCR NEGATIVE NEGATIVE Final    Comment: (NOTE) The Xpert Xpress SARS-CoV-2/FLU/RSV plus assay is intended as an aid in the diagnosis of influenza from Nasopharyngeal swab specimens and should not be used as a sole basis for treatment. Nasal washings and aspirates are unacceptable for Xpert Xpress SARS-CoV-2/FLU/RSV testing.  Fact Sheet for Patients: bloggercourse.com  Fact Sheet for Healthcare Providers: seriousbroker.it  This test is not yet approved or cleared by the [place]  FDA and has been authorized for detection and/or diagnosis of SARS-CoV-2 by FDA under an Emergency Use Authorization (EUA). This EUA will remain in effect (meaning this test can be used) for the duration of the COVID-19 declaration under Section 564(b)(1) of the Act, 21 U.S.C. section 360bbb-3(b)(1), unless the authorization is terminated or revoked.     Resp Syncytial Virus by PCR NEGATIVE NEGATIVE Final    Comment: (NOTE) Fact Sheet for Patients: bloggercourse.com  Fact Sheet for Healthcare Providers: seriousbroker.it  This test is not yet approved or cleared by the [place]  FDA and has been authorized for detection and/or diagnosis of SARS-CoV-2 by FDA under an Emergency Use Authorization (EUA). This EUA will remain in effect (meaning this test can be used) for the duration of the COVID-19 declaration under Section 564(b)(1) of the Act, 21 U.S.C. section 360bbb-3(b)(1), unless the authorization is terminated or revoked.  Performed at [facility], [address] [place], [place] [address]          Radiology Studies: DG HIPS BILAT WITH PELVIS MIN 5 VIEWS Result Date: 10/12/2024 EXAM: 5 VIEW(S) XRAY OF THE PELVIS AND BILATERAL HIP 10/12/2024 09:35:11 PM COMPARISON: None available.  CLINICAL HISTORY: 809823 Fall [id number] FINDINGS: BONES AND JOINTS: SI joints are symmetric. No acute bony abnormalities. Bilateral hips demonstrate normal alignment. Degenerative changes in the hips. Surgical clips in the pelvis. LUMBAR SPINE: Degenerative changes in the lower lumbar spine. SOFT TISSUES: Diffuse vascular calcifications. IMPRESSION: 1. No acute abnormality of the bilateral hips. 2. Degenerative changes in the lower lumbar spine and hips. Electronically signed by: Elsie Gravely MD 10/12/2024 09:38 PM EST RP Workstation: [id number]  CT HEAD WO CONTRAST Result Date: 10/12/2024 EXAM: CT HEAD AND CERVICAL SPINE 10/12/2024 07:44:37 PM TECHNIQUE: CT of the head and cervical spine was performed without the administration of intravenous contrast. Multiplanar reformatted images are provided for review. Automated exposure control, iterative reconstruction, and/or weight based adjustment of the mA/kV was utilized to reduce the radiation dose to as low as reasonably achievable. COMPARISON: CT head and CT cervical spine 09/06/2024 CLINICAL HISTORY: fall FINDINGS: CT HEAD BRAIN AND VENTRICLES: No acute intracranial hemorrhage. No mass effect or midline shift. No abnormal extra-axial fluid collection. No evidence of acute infarct. No hydrocephalus. ORBITS: No acute abnormality. SINUSES AND MASTOIDS: No acute abnormality. SOFT TISSUES AND SKULL: No acute skull fracture. No acute soft tissue abnormality. CT CERVICAL SPINE BONES AND ALIGNMENT: No acute fracture or traumatic malalignment. Unchanged anterolisthesis of C2 on C3 and reversal of the normal cervical lordosis. Solid C3-C4 interbody fusion. DEGENERATIVE CHANGES: Facet and uncovertebral hypertrophy with varying degrees of neural foraminal stenosis.[id number] SOFT TISSUES: No prevertebral soft tissue swelling. IMPRESSION: 1. No acute intracranial abnormality. 2. No acute fracture or traumatic malalignment of the cervical spine. Electronically signed by:  Gilmore Molt MD 10/12/2024 08:09 PM EST RP Workstation: [id number]   CT Cervical Spine Wo Contrast Result Date: 10/12/2024 EXAM: CT HEAD AND CERVICAL SPINE 10/12/2024 07:44:37 PM TECHNIQUE: CT of the head and cervical spine was performed without the administration of intravenous contrast. Multiplanar reformatted images are provided for review. Automated exposure control, iterative reconstruction, and/or weight based adjustment of the mA/kV was utilized to reduce the radiation dose to as low as reasonably achievable. COMPARISON: CT head and CT cervical spine 09/06/2024 CLINICAL HISTORY: fall FINDINGS: CT HEAD BRAIN AND VENTRICLES: No acute intracranial hemorrhage. No mass effect or midline shift. No abnormal extra-axial fluid collection. No evidence of acute infarct. No hydrocephalus. ORBITS: No acute abnormality. SINUSES AND MASTOIDS: No acute abnormality. SOFT TISSUES AND SKULL: No acute skull fracture. No acute soft tissue abnormality. CT CERVICAL SPINE BONES AND ALIGNMENT: No acute fracture or traumatic malalignment. Unchanged anterolisthesis of C2 on C3 and reversal of the normal cervical lordosis. Solid C3-C4 interbody fusion. DEGENERATIVE CHANGES: Facet and uncovertebral hypertrophy with varying degrees of neural foraminal stenosis.jdiqdijdijq SOFT TISSUES: No prevertebral soft tissue swelling. IMPRESSION: 1. No acute intracranial abnormality. 2. No acute fracture or traumatic malalignment of the cervical spine. Electronically signed by: Gilmore Molt MD 10/12/2024 08:09 PM EST RP Workstation: [id number]   DG Chest Port 1 View Result Date: 10/12/2024 EXAM: 1 VIEW(S) XRAY OF THE CHEST 10/12/2024 06:29:00 PM COMPARISON: Prior study. CLINICAL HISTORY: Questionable sepsis - evaluate for abnormality. FINDINGS: LUNGS AND PLEURA: Patchy infiltrates in the lung bases are new since the prior study and may represent atelectasis or pneumonia. No pleural effusion or pneumothorax. HEART AND MEDIASTINUM: Heart  size and pulmonary vascularity are normal. Mediastinal contours appear intact. Postoperative changes in the mediastinum. BONES AND SOFT TISSUES: Degenerative changes in the spine and shoulders. No acute osseous abnormality. IMPRESSION: 1. New patchy bibasilar airspace opacities, which may represent atelectasis or pneumonia. No pleural effusion or pneumothorax. Electronically signed by: Elsie Gravely MD 10/12/2024 06:32 PM EST RP Workstation: [id number]        Scheduled Meds:  amLODipine   5 mg Oral Daily   aspirin  EC  81 mg Oral Daily   atorvastatin   80 mg Oral Q breakfast   budesonide -glycopyrrolate -formoterol   2 puff Inhalation Daily   cetirizine   10 mg Oral QHS   clopidogrel   75 mg Oral Daily   colchicine   0.6 mg Oral Daily   enoxaparin  (LOVENOX ) injection  40 mg Subcutaneous Q24H   famotidine   40 mg Oral Daily   Fe Fum-Vit C-Vit B12-FA  1 capsule Oral QPC breakfast   FLUoxetine   40 mg Oral Daily   isosorbide  mononitrate  30 mg Oral Daily   linaclotide   290 mcg Oral QAC breakfast   montelukast   10 mg Oral Daily   multivitamin with minerals  1 tablet Oral Daily   oxyCODONE   15 mg Oral Q4H   pantoprazole   40 mg Oral BID AC   potassium chloride  SA  20 mEq Oral BID   rOPINIRole   1 mg Oral QHS   spironolactone   25 mg Oral Daily   tamsulosin   0.4 mg Oral Daily   Continuous Infusions:  sodium chloride  10 mL/hr at 10/13/24 2124   azithromycin  500 mg (10/13/24 2125)   cefTRIAXone  (ROCEPHIN )  IV 2 g (10/14/24 0611)     LOS: 2 days    Time spent: 35 minutes    Cresencio Fairly, MD [facility] Pager [phone]  If 7PM-7AM, please contact night-coverage [email or web address]  10/14/2024, 3:27 PM

## 2024-10-14 NOTE — TOC Initial Note (Signed)
 Transition of Care [facility]) - Initial/Assessment Note    Patient Details  Name: Bradley Sawyer MRN: [id number] Date of Birth: 1956/03/25  Transition of Care [facility]) CM/SW Contact:    Victory Jackquline RAMAN, RN Phone Number: 10/14/2024, 4:50 PM  Clinical Narrative:    RNCM received a message via secure chat from MD asking me to call the patient's daughter-in-Law [name] @ [phone]. RNCM review the chart and called [name], introduced role and explained that discharge planning would be discussed. PT is recommending HH/PT. She is not in agreement with HH/PT. She said that she can no longer handle his care and that she would like for him to go to a SNF. RNCM advised her that PT is recommending HH/PT and that she would have to find a LTC facility on her own for LTC. She stated that she will go look at a some LTC facilities tomorrow and call Social Services to get assistance with applying for LTC Medicaid. RNCM will continue to follow for discharge planning needs.          Expected Discharge Plan: Skilled Nursing Facility Barriers to Discharge: Continued Medical Work up   Patient Goals and CMS Choice            Expected Discharge Plan and Services     Post Acute Care Choice: Skilled Nursing Facility Living arrangements for the past 2 months: Single Family Home                                      Prior Living Arrangements/Services Living arrangements for the past 2 months: Single Family Home Lives with:: Self, Adult Children, Minor Children Patient language and need for interpreter reviewed:: Yes Do you feel safe going back to the place where you live?: Yes      Need for Family Participation in Patient Care: Yes (Comment) Care giver support system in place?: Yes (comment) Current home services: DME Criminal Activity/Legal Involvement Pertinent to Current Situation/Hospitalization: No - Comment as needed  Activities of Daily Living   ADL Screening (condition at time of  admission) Independently performs ADLs?: Yes (appropriate for developmental age) Is the patient deaf or have difficulty hearing?: No Does the patient have difficulty seeing, even when wearing glasses/contacts?: No Does the patient have difficulty concentrating, remembering, or making decisions?: No  Permission Sought/Granted                  Emotional Assessment Appearance:: Appears stated age Attitude/Demeanor/Rapport: Gracious Affect (typically observed): Quiet, Pleasant Orientation: : Oriented to Self, Oriented to Place, Oriented to  Time, Oriented to Situation Alcohol / Substance Use: Not Applicable Psych Involvement: No (comment)  Admission diagnosis:  Hypotension, unspecified hypotension type [I95.9] Sepsis due to pneumonia (HCC) [J18.9, A41.9] Patient Active Problem List   Diagnosis Date Noted   Sepsis due to pneumonia (HCC) 10/12/2024   Chronic pain disorder 09/06/2024   Gout attack 09/06/2024   Cellulitis of right foot 08/05/2024   Hypokalemia 08/05/2024   Hypomagnesemia 08/05/2024   Polyp of colon 09/14/2023   History of colonic polyps 09/14/2023   Chest pain 08/26/2023   AKI (acute kidney injury) 08/26/2023   Hypotension 08/26/2023   Leucocytosis 08/26/2023   BPH (benign prostatic hyperplasia) 08/26/2023   Depression with anxiety 08/26/2023   Chronic respiratory failure with hypoxia (HCC) 03/30/2021   COPD with acute exacerbation (HCC) 02/27/2021   History of CVA (cerebrovascular accident) 02/27/2021  Chronic pain, radicular 02/27/2021   Chronic, continuous use of opioids 02/27/2021   Unstable angina (HCC) 02/19/2021   Cigarette nicotine  dependence without complication 05/24/2018   Acute lower UTI 02/19/2017   UTI (urinary tract infection) 02/19/2017   Left inguinal hernia 08/12/2016   Elevated troponin 05/08/2016   Anal fissure 03/15/2016   External hemorrhoid 03/15/2016   Acute viral pharyngitis 03/20/2015   Olecranon bursitis of left elbow  12/23/2014   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   Carotid stenosis 10/15/2014   Stenosis of right subclavian artery 09/19/2014   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   PAD (peripheral artery disease) 08/23/2014   Preventative health care 08/12/2014   Occlusion and stenosis of carotid artery without mention of cerebral infarction 07/25/2014   Subclavian steal syndrome 07/25/2014   CAD (coronary artery disease) 07/10/2014   Nocturia 04/17/2014   Testicular pain, right 04/17/2014   Urethral stricture 04/17/2014   Cerebral infarction (HCC) 04/10/2014   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   Hematuria 08/06/2013   Compression fracture of L2, with back and right groin pain 07/18/2013   History of colon polyps 05/18/2012   COPD (chronic obstructive pulmonary disease) (HCC) 12/24/2009   Hyperlipemia 09/20/2006   TOBACCO ABUSE 09/20/2006   Essential hypertension 09/20/2006   GERD 09/20/2006   PCP:  Bobbette Coye LABOR, MD Pharmacy:   [facility] - [place], [place] - [address] [place] [address] Phone: [phone] Fax: [phone]  [facility] [place], [place] - [address] [place] [address] Phone: [phone] Fax: [phone]  [facility] [address] [place] - [address] [place] [address] Phone: [phone] Fax: [phone]  [facility] - [place], [place] - [address] [place] [phone] Phone: [phone] Fax: [phone]   - [facility] [address] [place] [address] Phone: [phone] Fax: [phone]  [facility] [address] [place] [address] Phone: [phone] Fax: [phone]  [facility] [phone] - [place], [place] - [address] [place] [address] Phone: [phone] Fax: [phone]  [facility] [address] [place] - [address] [place] [address] Phone: [phone] Fax: [phone]     Social Drivers of Health (SDOH) Social History: SDOH Screenings   Food Insecurity: No Food Insecurity (10/12/2024)  Housing: Low Risk  (10/12/2024)  Transportation Needs: No Transportation Needs (10/12/2024)  Utilities: Not At Risk (10/12/2024)  Depression (PHQ2-9): Medium Risk (01/22/2022)  Financial Resource Strain: Low Risk  (12/19/2023)   Received from [facility]  Physical Activity: Unknown (10/31/2023)   [facility] from [facility]  Social Connections: Socially Isolated (10/12/2024)  Stress: No Stress Concern Present (10/31/2023)   Received from [facility]  Tobacco Use: Medium Risk (10/12/2024)   SDOH Interventions:     Readmission Risk Interventions     No data to display

## 2024-10-14 NOTE — Plan of Care (Signed)
  Problem: Education: Goal: Knowledge of General Education information will improve Description: Including pain rating scale, medication(s)/side effects and non-pharmacologic comfort measures Outcome: Not Progressing   Problem: Health Behavior/Discharge Planning: Goal: Ability to manage health-related needs will improve Outcome: Not Progressing   Problem: Clinical Measurements: Goal: Ability to maintain clinical measurements within normal limits will improve Outcome: Not Progressing Goal: Will remain free from infection Outcome: Not Progressing Goal: Diagnostic test results will improve Outcome: Not Progressing Goal: Respiratory complications will improve Outcome: Not Progressing Goal: Cardiovascular complication will be avoided Outcome: Not Progressing   Problem: Activity: Goal: Risk for activity intolerance will decrease Outcome: Not Progressing   Problem: Nutrition: Goal: Adequate nutrition will be maintained Outcome: Not Progressing   Problem: Coping: Goal: Level of anxiety will decrease Outcome: Not Progressing   Problem: Elimination: Goal: Will not experience complications related to bowel motility Outcome: Not Progressing Goal: Will not experience complications related to urinary retention Outcome: Not Progressing   Problem: Pain Managment: Goal: General experience of comfort will improve and/or be controlled Outcome: Not Progressing   Problem: Safety: Goal: Ability to remain free from injury will improve Outcome: Not Progressing   Problem: Skin Integrity: Goal: Risk for impaired skin integrity will decrease Outcome: Not Progressing   Problem: Fluid Volume: Goal: Hemodynamic stability will improve Outcome: Not Progressing   Problem: Clinical Measurements: Goal: Diagnostic test results will improve Outcome: Not Progressing Goal: Signs and symptoms of infection will decrease Outcome: Not Progressing   Problem: Respiratory: Goal: Ability to maintain  adequate ventilation will improve Outcome: Not Progressing

## 2024-10-15 DIAGNOSIS — J42 Unspecified chronic bronchitis: Secondary | ICD-10-CM | POA: Diagnosis not present

## 2024-10-15 DIAGNOSIS — J189 Pneumonia, unspecified organism: Secondary | ICD-10-CM | POA: Diagnosis not present

## 2024-10-15 DIAGNOSIS — I2510 Atherosclerotic heart disease of native coronary artery without angina pectoris: Secondary | ICD-10-CM | POA: Diagnosis not present

## 2024-10-15 DIAGNOSIS — N179 Acute kidney failure, unspecified: Secondary | ICD-10-CM | POA: Diagnosis not present

## 2024-10-15 LAB — CBC
HCT: 25.6 % — ABNORMAL LOW (ref 39.0–52.0)
Hemoglobin: 8 g/dL — ABNORMAL LOW (ref 13.0–17.0)
MCH: 26.4 pg (ref 26.0–34.0)
MCHC: 31.3 g/dL (ref 30.0–36.0)
MCV: 84.5 fL (ref 80.0–100.0)
Platelets: 280 K/uL (ref 150–400)
RBC: 3.03 MIL/uL — ABNORMAL LOW (ref 4.22–5.81)
RDW: 15.9 % — ABNORMAL HIGH (ref 11.5–15.5)
WBC: 5.4 K/uL (ref 4.0–10.5)
nRBC: 0 % (ref 0.0–0.2)

## 2024-10-15 LAB — BASIC METABOLIC PANEL WITH GFR
Anion gap: 6 (ref 5–15)
BUN: 10 mg/dL (ref 8–23)
CO2: 36 mmol/L — ABNORMAL HIGH (ref 22–32)
Calcium: 8.9 mg/dL (ref 8.9–10.3)
Chloride: 98 mmol/L (ref 98–111)
Creatinine, Ser: 0.81 mg/dL (ref 0.61–1.24)
GFR, Estimated: >60 mL/min (ref >60)
Glucose, Bld: 87 mg/dL (ref 70–99)
Potassium: 3.7 mmol/L (ref 3.5–5.1)
Sodium: 140 mmol/L (ref 135–145)

## 2024-10-15 LAB — MAGNESIUM: Magnesium: 1.7 mg/dL (ref 1.7–2.4)

## 2024-10-15 LAB — VITAMIN B12: Vitamin B-12: 192 pg/mL (ref 180–914)

## 2024-10-15 LAB — PHOSPHORUS: Phosphorus: 2.5 mg/dL (ref 2.5–4.6)

## 2024-10-15 NOTE — [place] FL2 (Signed)
 [place]  MEDICAID FL2 LEVEL OF CARE FORM     IDENTIFICATION  Patient Name: Bradley Sawyer Birthdate: 19-Sep-1956 Sex: male Admission Date (Current Location): 10/12/2024  [facility] and [place] Number:  Chiropodist and Address:  [facility], [address], [place], [place] [address]      Provider Number: [id number]  Attending Physician Name and Address:  Maree Hue, MD  Relative Name and Phone Number:       Current Level of Care: Hospital Recommended Level of Care: Nursing Facility Prior Approval Number:    Date Approved/Denied:   PASRR Number:    Discharge Plan: SNF    Current Diagnoses: Patient Active Problem List   Diagnosis Date Noted   Sepsis due to pneumonia (HCC) 10/12/2024   Chronic pain disorder 09/06/2024   Gout attack 09/06/2024   Cellulitis of right foot 08/05/2024   Hypokalemia 08/05/2024   Hypomagnesemia 08/05/2024   Polyp of colon 09/14/2023   History of colonic polyps 09/14/2023   Chest pain 08/26/2023   AKI (acute kidney injury) 08/26/2023   Hypotension 08/26/2023   Leucocytosis 08/26/2023   BPH (benign prostatic hyperplasia) 08/26/2023   Depression with anxiety 08/26/2023   Chronic respiratory failure with hypoxia (HCC) 03/30/2021   COPD with acute exacerbation (HCC) 02/27/2021   History of CVA (cerebrovascular accident) 02/27/2021   Chronic pain, radicular 02/27/2021   Chronic, continuous use of opioids 02/27/2021   Unstable angina (HCC) 02/19/2021   Cigarette nicotine  dependence without complication 05/24/2018   Acute lower UTI 02/19/2017   UTI (urinary tract infection) 02/19/2017   Left inguinal hernia 08/12/2016   Elevated troponin 05/08/2016   Anal fissure 03/15/2016   External hemorrhoid 03/15/2016   Acute viral pharyngitis 03/20/2015   Olecranon bursitis of left elbow 12/23/2014   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   Carotid stenosis 10/15/2014   Stenosis of right subclavian artery 09/19/2014    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   PAD (peripheral artery disease) 08/23/2014   Preventative health care 08/12/2014   Occlusion and stenosis of carotid artery without mention of cerebral infarction 07/25/2014   Subclavian steal syndrome 07/25/2014   CAD (coronary artery disease) 07/10/2014   Nocturia 04/17/2014   Testicular pain, right 04/17/2014   Urethral stricture 04/17/2014   Cerebral infarction (HCC) 04/10/2014   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   Hematuria 08/06/2013   Compression fracture of L2, with back and right groin pain 07/18/2013   History of colon polyps 05/18/2012   COPD (chronic obstructive pulmonary disease) (HCC) 12/24/2009   Hyperlipemia 09/20/2006   TOBACCO ABUSE 09/20/2006   Essential hypertension 09/20/2006   GERD 09/20/2006    Orientation RESPIRATION BLADDER Height & Weight     Self, Time, Situation, Place  O2 (2.5 L) Continent Weight: 186 lb 11.7 oz (84.7 kg) Height:  5' 11 (180.3 cm)  BEHAVIORAL SYMPTOMS/MOOD NEUROLOGICAL BOWEL NUTRITION STATUS      Continent Diet (Heart)  AMBULATORY STATUS COMMUNICATION OF NEEDS Skin   Limited Assist Verbally Normal                       Personal Care Assistance Level of Assistance  Bathing, Dressing, Feeding Bathing Assistance: Limited assistance Feeding assistance: Independent Dressing Assistance: Limited assistance     Functional Limitations Info  Sight, Hearing, Speech Sight Info: Adequate Hearing Info: Adequate Speech Info: Adequate    SPECIAL CARE FACTORS FREQUENCY  PT (By licensed PT), OT (By licensed OT)  PT Frequency: 5x/week OT Frequency: 5x/week            Contractures      Additional Factors Info  Code Status, Allergies Code Status Info: Full Allergies Info: Bee Venom, Tapentadol           Current Medications (10/15/2024):  This is the current hospital active medication list Current Facility-Administered Medications   Medication Dose Route Frequency Provider Last Rate Last Admin   albuterol  (PROVENTIL ) (2.5 MG/3ML) 0.083% nebulizer solution 2.5 mg  2.5 mg Inhalation Q6H PRN Sim Emery CROME, MD       amLODipine  (NORVASC ) tablet 5 mg  5 mg Oral Daily Sim Emery L, MD   5 mg at 10/15/24 1004   aspirin  EC tablet 81 mg  81 mg Oral Daily Sim Emery CROME, MD   81 mg at 10/15/24 1006   atorvastatin  (LIPITOR ) tablet 80 mg  80 mg Oral Q breakfast Sim Emery CROME, MD   80 mg at 10/15/24 1005   azithromycin  (ZITHROMAX ) 500 mg in sodium chloride  0.9 % 250 mL IVPB  500 mg Intravenous Q24H Sim Emery L, MD 250 mL/hr at 10/15/24 0009 500 mg at 10/15/24 0009   budesonide -glycopyrrolate -formoterol  (BREZTRI ) 160-9-4.8 MCG/ACT inhaler 2 puff  2 puff Inhalation Daily Garba, Mohammad L, MD   2 puff at 10/15/24 1010   cefTRIAXone  (ROCEPHIN ) 2 g in sodium chloride  0.9 % 100 mL IVPB  2 g Intravenous Q24H Sim Emery L, MD 200 mL/hr at 10/15/24 0604 2 g at 10/15/24 0604   cetirizine  (ZYRTEC ) tablet 10 mg  10 mg Oral QHS Garba, Mohammad L, MD   10 mg at 10/14/24 2200   clopidogrel  (PLAVIX ) tablet 75 mg  75 mg Oral Daily Sim Emery CROME, MD   75 mg at 10/15/24 1006   colchicine  tablet 0.6 mg  0.6 mg Oral Daily Sim Emery L, MD   0.6 mg at 10/15/24 1005   cyclobenzaprine  (FLEXERIL ) tablet 10 mg  10 mg Oral BID PRN Sim Emery CROME, MD       enoxaparin  (LOVENOX ) injection 40 mg  40 mg Subcutaneous Q24H Sim Emery L, MD   40 mg at 10/15/24 1003   famotidine  (PEPCID ) tablet 40 mg  40 mg Oral Daily Garba, Mohammad L, MD   40 mg at 10/15/24 1006   Fe Fum-Vit C-Vit B12-FA (TRIGELS-F FORTE) capsule 1 capsule  1 capsule Oral QPC breakfast Garba, Mohammad L, MD   1 capsule at 10/15/24 1006   FLUoxetine  (PROZAC ) capsule 40 mg  40 mg Oral Daily Sim Emery CROME, MD   40 mg at 10/15/24 1004   ipratropium-albuterol  (DUONEB) 0.5-2.5 (3) MG/3ML nebulizer solution 3 mL  3 mL Nebulization Q6H PRN Sim Emery CROME, MD        isosorbide  mononitrate (IMDUR ) 24 hr tablet 30 mg  30 mg Oral Daily Garba, Mohammad L, MD   30 mg at 10/15/24 1005   linaclotide  (LINZESS ) capsule 290 mcg  290 mcg Oral QAC breakfast Sim Emery CROME, MD   290 mcg at 10/15/24 1007   montelukast  (SINGULAIR ) tablet 10 mg  10 mg Oral Daily Garba, Mohammad L, MD   10 mg at 10/15/24 1005   multivitamin with minerals tablet 1 tablet  1 tablet Oral Daily Garba, Mohammad L, MD   1 tablet at 10/15/24 1006   nitroGLYCERIN  (NITROSTAT ) SL tablet 0.4 mg  0.4 mg Sublingual Q5 min PRN Sim Emery CROME, MD       ondansetron  (ZOFRAN ) tablet 4 mg  4 mg Oral Q8H PRN Sim Emery CROME, MD       oxyCODONE  (Oxy IR/ROXICODONE ) immediate release tablet 15 mg  15 mg Oral Q4H Garba, Mohammad L, MD   15 mg at 10/15/24 1004   pantoprazole  (PROTONIX ) EC tablet 40 mg  40 mg Oral BID AC Garba, Mohammad L, MD   40 mg at 10/15/24 1005   polyethylene glycol (MIRALAX  / GLYCOLAX ) packet 17 g  17 g Oral Daily PRN Garba, Mohammad L, MD       potassium chloride  SA (KLOR-CON  M) CR tablet 20 mEq  20 mEq Oral BID Maree Hue, MD   20 mEq at 10/15/24 1005   rOPINIRole  (REQUIP ) tablet 1 mg  1 mg Oral QHS Garba, Mohammad L, MD   1 mg at 10/14/24 2159   spironolactone  (ALDACTONE ) tablet 25 mg  25 mg Oral Daily Garba, Mohammad L, MD   25 mg at 10/15/24 1006   tamsulosin  (FLOMAX ) capsule 0.4 mg  0.4 mg Oral Daily Sim Emery L, MD   0.4 mg at 10/15/24 1004   Facility-Administered Medications Ordered in Other Encounters  Medication Dose Route Frequency Provider Last Rate Last Admin   sodium chloride  flush (NS) 0.9 % injection 3 mL  3 mL Intravenous Q12H Fernand Denyse LABOR, MD         Discharge Medications: Please see discharge summary for a list of discharge medications.  Relevant Imaging Results:  Relevant Lab Results:   Additional Information SSN: [id number]  Terrin Imparato  [place], LCSW

## 2024-10-15 NOTE — Progress Notes (Signed)
 1      PROGRESS NOTE    Bradley Sawyer  FMW:[id number] DOB: September 28, 1956 DOA: 10/12/2024 PCP: Bobbette Coye LABOR, MD   Brief Narrative:   68 y.o. male with medical history significant of essential hypertension, GERD, COPD, history of CVA, hyperlipidemia, osteoarthritis, anxiety disorder, iron deficiency anemia, gout, who presents to the ER with shortness of breath cough and fever   11/22: PT and OT consult, palliative care consult 11/23: MRI brain 11/24: Family demanding placement   Assessment & Plan:   Principal Problem:   Sepsis due to pneumonia (HCC) Active Problems:   AKI (acute kidney injury)   Essential hypertension   COPD (chronic obstructive pulmonary disease) (HCC)   CAD (coronary artery disease)   Hyperlipemia   GERD   BPH (benign prostatic hyperplasia)   Hypotension   Chronic respiratory failure with hypoxia (HCC)   PAD (peripheral artery disease)   History of CVA (cerebrovascular accident)   TOBACCO ABUSE   Hypokalemia   #1 sepsis due to pneumonia: Patient met sepsis criteria with initial temperature of 101 at home.  No leukocytosis but has tachycardia.  Evidence of pneumonia.  Patient has severe sepsis with hypotension.  Lactic acid is however 1.4.   - Continue IV Rocephin  and Zithromax .  Initially required 4 L and now being weaned off.  Currently on 2.5 L I had discussion with his POA/[name] who shared that his behaviors are concerning and he is getting aggressive at times and they cannot take care of him at home. POA is seeking nursing home placement - MRI of the brain negative for any acute pathology.     #2 severe hypokalemia: Repleted   #3 AKI: Likely prerenal.  Resolved with hydration   #4 normocytic anemia: Probably due to chronic disease.  Continue to monitor.   #5 essential hypertension: Continue Norvasc , Imdur , Aldactone    #6 COPD: No clear exacerbation.  Continue to monitor   #7 coronary artery disease: Stable at baseline   #8 GERD: Continue  PPIs   #9 hyperlipidemia: Continue with statin, check CK due to recurrent fall   #10 BPH: Continue home regimen  11.  Chronic pain: Patient is receiving Roxicodone  15 mg every 4 hours scheduled.      DVT prophylaxis: Lovenox  enoxaparin  (LOVENOX ) injection 40 mg Start: 10/13/24 0800     Code Status: Full code Family Communication: Updated POA/dawn and ex-wife at bedside Disposition Plan: (Patient is medically stable POA is seeking nursing home placement.  ICM working on it     Antimicrobials:  Rocephin  Azithromycin    Subjective:  No new issues, wanting to go home  Objective: Vitals:   10/14/24 1937 10/15/24 0509 10/15/24 0734 10/15/24 1210  BP: 114/67 (!) 152/72 (!) 159/90 121/85  Pulse: 81 74 64 77  Resp: 18 18 14 16   Temp: 98.1 F (36.7 C) 98.4 F (36.9 C) 99.1 F (37.3 C) 98 F (36.7 C)  TempSrc: Oral     SpO2: 95% 98% 94% 96%  Weight:      Height:        Intake/Output Summary (Last 24 hours) at 10/15/2024 1557 Last data filed at 10/15/2024 1300 Gross per 24 hour  Intake 1228.78 ml  Output 1700 ml  Net -471.22 ml   Filed Weights   10/12/24 2250  Weight: 84.7 kg    Examination:  General exam: Appears calm and comfortable  Respiratory system: Clear to auscultation. Respiratory effort normal. Cardiovascular system: S1 & S2 heard, RRR. No murmurs, rubs, gallops or  clicks. No pedal edema. Gastrointestinal system: Abdomen is soft, benign Central nervous system: Sleepy. No focal neurological deficits. Extremities: Symmetric 5 x 5 power. Skin: No rashes, lesions or ulcers Psychiatry: Judgement and insight appear normal. Mood & affect appropriate.     Data Reviewed: I have personally reviewed following labs and imaging studies  CBC: Recent Labs  Lab 10/11/24 1252 10/12/24 1845 10/13/24 0438 10/14/24 0534 10/15/24 0332  WBC 11.8* 9.8 5.7 5.3 5.4  NEUTROABS 8.6* 7.0  --   --   --   HGB 9.6* 8.5* 6.7* 7.8* 8.0*  HCT 30.9* 27.6* 21.7* 25.0*  25.6*  MCV 84.9 85.7 85.1 85.6 84.5  PLT 240 244 197 239 280   Basic Metabolic Panel: Recent Labs  Lab 10/11/24 1252 10/12/24 1845 10/13/24 0438 10/13/24 1119 10/14/24 0534 10/15/24 0332  NA 138 136 139  --  140 140  K 2.6* 2.7* 2.5* 3.2* 3.2* 3.7  CL 93* 90* 96*  --  99 98  CO2 35* 34* 35*  --  37* 36*  GLUCOSE 106* 106* 92  --  91 87  BUN 9 18 15   --  9 10  CREATININE 1.06 1.65* 1.19  --  0.77 0.81  CALCIUM  9.0 8.9 8.4*  --  8.6* 8.9  MG  --  1.7  --  2.2 1.9 1.7  PHOS  --   --   --   --   --  2.5   GFR: Estimated Creatinine Clearance: 93 mL/min (by C-G formula based on SCr of 0.81 mg/dL). Liver Function Tests: Recent Labs  Lab 10/11/24 1252 10/12/24 1845 10/13/24 0438 10/14/24 0534  AST 27 37 26 23  ALT 23 20 15 12   ALKPHOS 145* 113 90 89  BILITOT 0.8 0.5 0.4 0.3  PROT 7.4 6.8 5.6* 5.8*  ALBUMIN 4.0 3.5 2.9* 3.0*   No results for input(s): LIPASE, AMYLASE in the last 168 hours. No results for input(s): AMMONIA in the last 168 hours. Coagulation Profile: Recent Labs  Lab 10/12/24 1829 10/13/24 0438  INR 1.3* 1.3*    Sepsis Labs: Recent Labs  Lab 10/12/24 1829  LATICACIDVEN 1.4    Recent Results (from the past 240 hours)  Blood Culture (routine x 2)     Status: None (Preliminary result)   Collection Time: 10/12/24  6:29 PM   Specimen: BLOOD  Result Value Ref Range Status   Specimen Description BLOOD LEFT ANTECUBITAL  Final   Special Requests   Final    BOTTLES DRAWN AEROBIC AND ANAEROBIC Blood Culture adequate volume   Culture   Final    NO GROWTH 3 DAYS Performed at [facility], [address] [place], [place] [address]    Report Status PENDING  Incomplete  Blood Culture (routine x 2)     Status: None (Preliminary result)   Collection Time: 10/12/24  6:29 PM   Specimen: BLOOD  Result Value Ref Range Status   Specimen Description BLOOD RIGHT ANTECUBITAL  Final   Special Requests   Final    BOTTLES DRAWN AEROBIC AND  ANAEROBIC Blood Culture adequate volume   Culture   Final    NO GROWTH 3 DAYS Performed at [facility], [address] [place], [place] [address]    Report Status PENDING  Incomplete  Resp panel by RT-PCR (RSV, Flu A&B, Covid) Anterior Nasal Swab     Status: None   Collection Time: 10/12/24  6:45 PM   Specimen: Anterior Nasal Swab  Result Value  Ref Range Status   SARS Coronavirus 2 by RT PCR NEGATIVE NEGATIVE Final    Comment: (NOTE) SARS-CoV-2 target nucleic acids are NOT DETECTED.  The SARS-CoV-2 RNA is generally detectable in upper respiratory specimens during the acute phase of infection. The lowest concentration of SARS-CoV-2 viral copies this assay can detect is 138 copies/mL. A negative result does not preclude SARS-Cov-2 infection and should not be used as the sole basis for treatment or other patient management decisions. A negative result may occur with  improper specimen collection/handling, submission of specimen other than nasopharyngeal swab, presence of viral mutation(s) within the areas targeted by this assay, and inadequate number of viral copies(<138 copies/mL). A negative result must be combined with clinical observations, patient history, and epidemiological information. The expected result is Negative.  Fact Sheet for Patients:  bloggercourse.com  Fact Sheet for Healthcare Providers:  seriousbroker.it  This test is no t yet approved or cleared by the [place]  FDA and  has been authorized for detection and/or diagnosis of SARS-CoV-2 by FDA under an Emergency Use Authorization (EUA). This EUA will remain  in effect (meaning this test can be used) for the duration of the COVID-19 declaration under Section 564(b)(1) of the Act, 21 U.S.C.section 360bbb-3(b)(1), unless the authorization is terminated  or revoked sooner.       Influenza A by PCR NEGATIVE NEGATIVE Final   Influenza B by PCR  NEGATIVE NEGATIVE Final    Comment: (NOTE) The Xpert Xpress SARS-CoV-2/FLU/RSV plus assay is intended as an aid in the diagnosis of influenza from Nasopharyngeal swab specimens and should not be used as a sole basis for treatment. Nasal washings and aspirates are unacceptable for Xpert Xpress SARS-CoV-2/FLU/RSV testing.  Fact Sheet for Patients: bloggercourse.com  Fact Sheet for Healthcare Providers: seriousbroker.it  This test is not yet approved or cleared by the [place]  FDA and has been authorized for detection and/or diagnosis of SARS-CoV-2 by FDA under an Emergency Use Authorization (EUA). This EUA will remain in effect (meaning this test can be used) for the duration of the COVID-19 declaration under Section 564(b)(1) of the Act, 21 U.S.C. section 360bbb-3(b)(1), unless the authorization is terminated or revoked.     Resp Syncytial Virus by PCR NEGATIVE NEGATIVE Final    Comment: (NOTE) Fact Sheet for Patients: bloggercourse.com  Fact Sheet for Healthcare Providers: seriousbroker.it  This test is not yet approved or cleared by the [place]  FDA and has been authorized for detection and/or diagnosis of SARS-CoV-2 by FDA under an Emergency Use Authorization (EUA). This EUA will remain in effect (meaning this test can be used) for the duration of the COVID-19 declaration under Section 564(b)(1) of the Act, 21 U.S.C. section 360bbb-3(b)(1), unless the authorization is terminated or revoked.  Performed at [facility], [address], [place], [place] [address]          Radiology Studies: MR BRAIN WO CONTRAST Result Date: 10/14/2024 EXAM: MRI BRAIN WITHOUT CONTRAST 10/14/2024 09:36:39 PM TECHNIQUE: Multiplanar multisequence MRI of the head/brain was performed without the administration of intravenous contrast. COMPARISON: MR Head Without IV Contrast  12/22/2023. CLINICAL HISTORY: Mental status change, unknown cause. FINDINGS: BRAIN AND VENTRICLES: No acute infarct. No intracranial hemorrhage. No mass. No midline shift. No hydrocephalus. Multifocal hyperintense T2-weighted signal within the cerebral white matter, most commonly due to chronic small vessel disease. The sella is unremarkable. Normal flow voids. ORBITS: No acute abnormality. SINUSES AND MASTOIDS: Fluid in the left mastoid process. Normal nasopharynx. BONES AND SOFT TISSUES: Normal  marrow signal. No acute soft tissue abnormality. IMPRESSION: 1. No acute intracranial abnormality. 2. Multifocal T2 hyperintensities in the cerebral white matter, most consistent with chronic small vessel ischemic changes. Electronically signed by: Franky Stanford MD 10/14/2024 09:57 PM EST RP Workstation: [id number]        Scheduled Meds:  amLODipine   5 mg Oral Daily   aspirin  EC  81 mg Oral Daily   atorvastatin   80 mg Oral Q breakfast   budesonide -glycopyrrolate -formoterol   2 puff Inhalation Daily   cetirizine   10 mg Oral QHS   clopidogrel   75 mg Oral Daily   colchicine   0.6 mg Oral Daily   enoxaparin  (LOVENOX ) injection  40 mg Subcutaneous Q24H   famotidine   40 mg Oral Daily   Fe Fum-Vit C-Vit B12-FA  1 capsule Oral QPC breakfast   FLUoxetine   40 mg Oral Daily   isosorbide  mononitrate  30 mg Oral Daily   linaclotide   290 mcg Oral QAC breakfast   montelukast   10 mg Oral Daily   multivitamin with minerals  1 tablet Oral Daily   oxyCODONE   15 mg Oral Q4H   pantoprazole   40 mg Oral BID AC   potassium chloride  SA  20 mEq Oral BID   rOPINIRole   1 mg Oral QHS   spironolactone   25 mg Oral Daily   tamsulosin   0.4 mg Oral Daily   Continuous Infusions:  azithromycin  500 mg (10/15/24 0009)   cefTRIAXone  (ROCEPHIN )  IV 2 g (10/15/24 0604)     LOS: 3 days    Time spent: 35 minutes    Aasha Dina Maree, MD [facility] Pager [phone]  If 7PM-7AM, please contact  night-coverage [email or web address]  10/15/2024, 3:57 PM

## 2024-10-15 NOTE — Consult Note (Signed)
 PHARMACY CONSULT NOTE - ELECTROLYTES  Pharmacy Consult for Electrolyte Monitoring and Replacement   Recent Labs: Height: 5' 11 (180.3 cm) Weight: 84.7 kg (186 lb 11.7 oz) IBW/kg (Calculated) : 75.3 Estimated Creatinine Clearance: 93 mL/min (by C-G formula based on SCr of 0.81 mg/dL). Potassium (mmol/L)  Date Value  10/15/2024 3.7  07/01/2013 3.2 (L)   Magnesium  (mg/dL)  Date Value  88/75/7974 1.7   Calcium  (mg/dL)  Date Value  88/75/7974 8.9   Calcium , Total (mg/dL)  Date Value  91/89/7985 9.5   Albumin (g/dL)  Date Value  88/76/7974 3.0 (L)  06/27/2013 3.6   Phosphorus (mg/dL)  Date Value  88/75/7974 2.5   Sodium (mmol/L)  Date Value  10/15/2024 140  07/01/2013 136   Corrected Ca: 9.4 mg/dL  Assessment  Bradley Sawyer is a 68 y.o. male presenting with sepsis due to pneumonia. PMH significant for HTN, GERD, COPD, CVA, HLD, OA, and axiety. Pharmacy has been consulted to monitor and replace electrolytes.  Diet: heart healthy MIVF: N/A Pertinent medications: spironolactone , potassium chloride  20mEq PO twice daily  Goal of Therapy: Electrolytes WNL  Plan:  No electrolyte replacement currently warranted Check BMP, Phos with AM labs  Thank you for allowing pharmacy to be a part of this patient's care.  Will M. Lenon, PharmD, BCPS Clinical Pharmacist 10/15/2024 9:37 AM

## 2024-10-15 NOTE — Plan of Care (Signed)

## 2024-10-15 NOTE — TOC Initial Note (Signed)
 Transition of Care [facility]) - Initial/Assessment Note    Patient Details  Name: Bradley Sawyer MRN: [id number] Date of Birth: Sep 30, 1956  [facility]) CM/SW Contact:    Alvaro Louder, LCSW Phone Number: 10/15/2024, 4:06 PM  Clinical Narrative:  Patient is from home. PCP is Kip Corrington. Patient and family wants SNF, With current functioning and ambulation patient will no qualify for SNF. Family indicated that they were interested in LTC Facility. LCSWA faxed out LTC offers to facilities and gave family a list. Daughter-in-law and Son are reviewing list and will inform tomorrow morning.   TOC to follow for discharge         Expected Discharge Plan: Skilled Nursing Facility Barriers to Discharge: Continued Medical Work up   Patient Goals and CMS Choice            Expected Discharge Plan and Services     Post Acute Care Choice: Skilled Nursing Facility Living arrangements for the past 2 months: Single Family Home                                      Prior Living Arrangements/Services Living arrangements for the past 2 months: Single Family Home Lives with:: Self, Adult Children, Minor Children Patient language and need for interpreter reviewed:: Yes Do you feel safe going back to the place where you live?: Yes      Need for Family Participation in Patient Care: Yes (Comment) Care giver support system in place?: Yes (comment) Current home services: DME Criminal Activity/Legal Involvement Pertinent to Current Situation/Hospitalization: No - Comment as needed  Activities of Daily Living   ADL Screening (condition at time of admission) Independently performs ADLs?: Yes (appropriate for developmental age) Is the patient deaf or have difficulty hearing?: No Does the patient have difficulty seeing, even when wearing glasses/contacts?: No Does the patient have difficulty concentrating, remembering, or making decisions?: No  Permission Sought/Granted                   Emotional Assessment Appearance:: Appears stated age Attitude/Demeanor/Rapport: Gracious Affect (typically observed): Quiet, Pleasant Orientation: : Oriented to Self, Oriented to Place, Oriented to  Time, Oriented to Situation Alcohol / Substance Use: Not Applicable Psych Involvement: No (comment)  Admission diagnosis:  Hypotension, unspecified hypotension type [I95.9] Sepsis due to pneumonia (HCC) [J18.9, A41.9] Patient Active Problem List   Diagnosis Date Noted   Sepsis due to pneumonia (HCC) 10/12/2024   Chronic pain disorder 09/06/2024   Gout attack 09/06/2024   Cellulitis of right foot 08/05/2024   Hypokalemia 08/05/2024   Hypomagnesemia 08/05/2024   Polyp of colon 09/14/2023   History of colonic polyps 09/14/2023   Chest pain 08/26/2023   AKI (acute kidney injury) 08/26/2023   Hypotension 08/26/2023   Leucocytosis 08/26/2023   BPH (benign prostatic hyperplasia) 08/26/2023   Depression with anxiety 08/26/2023   Chronic respiratory failure with hypoxia (HCC) 03/30/2021   COPD with acute exacerbation (HCC) 02/27/2021   History of CVA (cerebrovascular accident) 02/27/2021   Chronic pain, radicular 02/27/2021   Chronic, continuous use of opioids 02/27/2021   Unstable angina (HCC) 02/19/2021   Cigarette nicotine  dependence without complication 05/24/2018   Acute lower UTI 02/19/2017   UTI (urinary tract infection) 02/19/2017   Left inguinal hernia 08/12/2016   Elevated troponin 05/08/2016   Anal fissure 03/15/2016   External hemorrhoid 03/15/2016   Acute viral pharyngitis 03/20/2015  Olecranon bursitis of left elbow 12/23/2014   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   Carotid stenosis 10/15/2014   Stenosis of right subclavian artery 09/19/2014   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   PAD (peripheral artery disease) 08/23/2014   Preventative health care 08/12/2014   Occlusion and stenosis of carotid artery without mention of cerebral  infarction 07/25/2014   Subclavian steal syndrome 07/25/2014   CAD (coronary artery disease) 07/10/2014   Nocturia 04/17/2014   Testicular pain, right 04/17/2014   Urethral stricture 04/17/2014   Cerebral infarction (HCC) 04/10/2014   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   Hematuria 08/06/2013   Compression fracture of L2, with back and right groin pain 07/18/2013   History of colon polyps 05/18/2012   COPD (chronic obstructive pulmonary disease) (HCC) 12/24/2009   Hyperlipemia 09/20/2006   TOBACCO ABUSE 09/20/2006   Essential hypertension 09/20/2006   GERD 09/20/2006   PCP:  Bobbette Coye LABOR, MD Pharmacy:   [facility] - [place], [place] - [address] [place] [address] Phone: [phone] Fax: [phone]  [facility] [place], [place] - [address] [place] [address] Phone: [phone] Fax: [phone]  [facility] [address] [place] - [address] [place] [address] Phone: [phone] Fax: [phone]  [facility] - [place], [place] - [address] [place] [phone] Phone: [phone] Fax: [phone]  [place] [facility] [address] [place] [address] Phone: [phone] Fax: [phone]  [facility] [address] [place] [address] Phone: [phone] Fax: [phone]  [facility] [phone] - [place], [place] - [address] [place] [address] Phone: [phone] Fax: [phone]  [facility] [address], [place] - [address] [place] [address] Phone: [phone] Fax: [phone]     Social Drivers of Health (SDOH) Social History: SDOH Screenings   Food Insecurity: No Food Insecurity (10/12/2024)  Housing: Low Risk  (10/12/2024)  Transportation Needs: No Transportation Needs  (10/12/2024)  Utilities: Not At Risk (10/12/2024)  Depression (PHQ2-9): Medium Risk (01/22/2022)  Financial Resource Strain: Low Risk  (12/19/2023)   Received from [facility]  Physical Activity: Unknown (10/31/2023)   Received from [facility]  Social Connections: Socially Isolated (10/12/2024)  Stress: No Stress Concern Present (10/31/2023)   Received from [facility]  Tobacco Use: Medium Risk (10/12/2024)   SDOH Interventions:     Readmission Risk Interventions     No data to display

## 2024-10-15 NOTE — Progress Notes (Signed)
 Occupational Therapy Treatment Patient Details Name: Bradley Sawyer MRN: [id number] DOB: 03-11-1956 Today's Date: 10/15/2024   History of present illness 68 y.o. male with medical history significant of essential hypertension, GERD, COPD, history of CVA, hyperlipidemia, osteoarthritis, anxiety disorder, iron deficiency anemia, gout, who presents to the ER with shortness of breath cough and fever   OT comments  Pt is supine in bed on arrival. Easily arousable and agreeable to OT session. He reports bil hip and testicle pain. Pt performed bed mobility with supervision. Mod/Max A to don/doff post op shoe. Pt required Min A progressing to CGA for STS from EOB and recliner during session with increased rocking and trials to actually reach standing. Able to ambulate to bathroom and back using RW ~20 ft total with CGA, cues for safety/pacing and 02 cord management. All aspects of toileting performed with CGA/SBA. Sp02 maintained in 90s on 2-3 L during session. Sat in chair and called to speak with family on concerns/recommendations then pt wishing to return to bed requiring Min/CGA to SPT back to bed. Overall, pt is weaker than baseline and a high fall risk d/t post op shoe, 02 cord management and poor activity tolerance. Pt returned to bed with all needs in place and will cont to require skilled acute OT services to maximize his safety and IND to return to PLOF.      If plan is discharge home, recommend the following:  A little help with walking and/or transfers;A little help with bathing/dressing/bathroom;Assistance with cooking/housework;Direct supervision/assist for medications management;Direct supervision/assist for financial management;Assist for transportation   Equipment Recommendations  BSC/3in1    Recommendations for Other Services      Precautions / Restrictions Precautions Precautions: Fall Recall of Precautions/Restrictions: Impaired Required Braces or Orthoses: Other Brace Other  Brace: Post op shoe Restrictions Weight Bearing Restrictions Per Provider Order: Yes RLE Weight Bearing Per Provider Order: Weight bearing as tolerated       Mobility Bed Mobility Overal bed mobility: Needs Assistance Bed Mobility: Supine to Sit, Sit to Supine     Supine to sit: Supervision Sit to supine: Supervision   General bed mobility comments: increased time/effort    Transfers Overall transfer level: Needs assistance Equipment used: Rolling walker (2 wheels) Transfers: Sit to/from Stand Sit to Stand: Contact guard assist           General transfer comment: increased trials/rocking to finally reach standing from EOB and recliner during session, ambulated within the room using RW with CGA; cues for pacing and safety awareness with 02 tubing     Balance Overall balance assessment: Needs assistance, History of Falls Sitting-balance support: Feet supported, Bilateral upper extremity supported Sitting balance-Leahy Scale: Normal     Standing balance support: Reliant on assistive device for balance, Bilateral upper extremity supported, During functional activity Standing balance-Leahy Scale: Fair Standing balance comment: CGA and RW use                           ADL either performed or assessed with clinical judgement   ADL Overall ADL's : Needs assistance/impaired     Grooming: Wash/dry hands;Standing;Contact guard assist               Lower Body Dressing: Moderate assistance;Sitting/lateral leans Lower Body Dressing Details (indicate cue type and reason): donning/doffing post op shoe Toilet Transfer: Contact guard assist;Regular Toilet;Rolling walker (2 wheels);Grab bars Toilet Transfer Details (indicate cue type and reason): cont BM after BM smears on  chux pad Toileting- Clothing Manipulation and Hygiene: Supervision/safety;Sitting/lateral lean;Sit to/from stand       Functional mobility during ADLs: Rolling walker (2 wheels);Contact guard  assist      Extremity/Trunk Assessment              Vision       Perception     Praxis     Communication Communication Communication: Impaired Factors Affecting Communication: Hearing impaired;Reduced clarity of speech   Cognition Arousal: Alert Behavior During Therapy: WFL for tasks assessed/performed, Restless                                 Following commands: Intact Following commands impaired: Follows one step commands with increased time      Cueing   Cueing Techniques: Verbal cues, Tactile cues  Exercises      Shoulder Instructions       General Comments sp02 stable on 2-3 L throughout session    Pertinent Vitals/ Pain       Pain Assessment Pain Assessment: 0-10 Pain Score: 7  Pain Location: hips Pain Descriptors / Indicators: Constant Pain Intervention(s): Limited activity within patient's tolerance, Monitored during session, Repositioned, Patient requesting pain meds-RN notified  Home Living                                          Prior Functioning/Environment              Frequency  Min 2X/week        Progress Toward Goals  OT Goals(current goals can now be found in the care plan section)  Progress towards OT goals: Progressing toward goals  Acute Rehab OT Goals Patient Stated Goal: get better OT Goal Formulation: With patient Time For Goal Achievement: 10/27/24 Potential to Achieve Goals: Fair  Plan      Co-evaluation                 AM-PAC OT 6 Clicks Daily Activity     Outcome Measure   Help from another person eating meals?: A Little Help from another person taking care of personal grooming?: A Little Help from another person toileting, which includes using toliet, bedpan, or urinal?: A Little Help from another person bathing (including washing, rinsing, drying)?: A Lot Help from another person to put on and taking off regular upper body clothing?: A Little Help from another  person to put on and taking off regular lower body clothing?: A Lot 6 Click Score: 16    End of Session Equipment Utilized During Treatment: Gait belt;Rolling walker (2 wheels);Oxygen  OT Visit Diagnosis: Unsteadiness on feet (R26.81);Other abnormalities of gait and mobility (R26.89);Repeated falls (R29.6);Muscle weakness (generalized) (M62.81);History of falling (Z91.81)   Activity Tolerance Patient tolerated treatment well   Patient Left in bed;with call bell/phone within reach;with bed alarm set   Nurse Communication Mobility status        Time: 1519-1600 OT Time Calculation (min): 41 min  Charges: OT General Charges $OT Visit: 1 Visit OT Treatments $Self Care/Home Management : 23-37 mins $Therapeutic Activity: 8-22 mins  Mabell Esguerra Chrismon, OTR/L  10/15/24, 4:36 PM   Osualdo Hansell E Chrismon 10/15/2024, 4:28 PM

## 2024-10-16 DIAGNOSIS — E876 Hypokalemia: Secondary | ICD-10-CM

## 2024-10-16 DIAGNOSIS — F17200 Nicotine dependence, unspecified, uncomplicated: Secondary | ICD-10-CM

## 2024-10-16 LAB — BASIC METABOLIC PANEL WITH GFR
Anion gap: 8 (ref 5–15)
BUN: 9 mg/dL (ref 8–23)
CO2: 32 mmol/L (ref 22–32)
Calcium: 9.3 mg/dL (ref 8.9–10.3)
Chloride: 100 mmol/L (ref 98–111)
Creatinine, Ser: 0.83 mg/dL (ref 0.61–1.24)
GFR, Estimated: >60 mL/min (ref >60)
Glucose, Bld: 101 mg/dL — ABNORMAL HIGH (ref 70–99)
Potassium: 3.7 mmol/L (ref 3.5–5.1)
Sodium: 139 mmol/L (ref 135–145)

## 2024-10-16 LAB — PHOSPHORUS: Phosphorus: 3 mg/dL (ref 2.5–4.6)

## 2024-10-16 MED ORDER — AMLODIPINE BESYLATE 10 MG PO TABS
10.0000 mg | ORAL_TABLET | Freq: Every day | ORAL | Status: DC
  Administered 2024-10-17: 10 mg via ORAL
  Filled 2024-10-16: qty 1

## 2024-10-16 MED ORDER — SPIRONOLACTONE 25 MG PO TABS
50.0000 mg | ORAL_TABLET | Freq: Every day | ORAL | Status: DC
  Administered 2024-10-17: 50 mg via ORAL
  Filled 2024-10-16: qty 2

## 2024-10-16 MED ORDER — AZITHROMYCIN 500 MG PO TABS
500.0000 mg | ORAL_TABLET | Freq: Every day | ORAL | Status: AC
  Administered 2024-10-16: 500 mg via ORAL
  Filled 2024-10-16: qty 1

## 2024-10-16 NOTE — Progress Notes (Signed)
 PHARMACIST - PHYSICIAN COMMUNICATION CONCERNING: Antibiotic IV to Oral Route Change Policy  RECOMMENDATION: This patient is receiving azithromycin  intravenously. Based on criteria approved by the Pharmacy and Therapeutics Committee, the antibiotic(s) is/are being converted to the equivalent dose of an oral formulation.   DESCRIPTION: These criteria include: Patient being treated for a respiratory tract infection, urinary tract infection, cellulitis or Clostridioides difficile-associated diarrhea if on metronidazole . The patient is not neutropenic and does not exhibit a malabsorptive GI state. The patient is eating (either orally or via tube) and/or has been taking other orally administered medications for at least 24 hours. The patient is improving clinically and has a 24-hour Tmax of <100.5 F.  If you have questions about this conversion, please contact the Pharmacy Department:  [x]   [phone] )  [facility] []   [phone] )  [facility] []   [phone] )  [facility]  []   [phone] )  [facility]   Will M. Lenon, PharmD, BCPS Clinical Pharmacist 10/16/2024 1:35 PM

## 2024-10-16 NOTE — Progress Notes (Signed)
 1      PROGRESS NOTE    Bradley Sawyer  FMW:[id number] DOB: Aug 18, 1956 DOA: 10/12/2024 PCP: Bobbette Coye LABOR, MD   Brief Narrative:   68 y.o. male with medical history significant of essential hypertension, GERD, COPD, history of CVA, hyperlipidemia, osteoarthritis, anxiety disorder, iron deficiency anemia, gout, who presents to the ER with shortness of breath cough and fever   11/22: PT and OT consult, palliative care consult 11/23: MRI brain negative 11/24-11/25: Family demanding placement   Assessment & Plan:   Principal Problem:   Sepsis due to pneumonia (HCC) Active Problems:   AKI (acute kidney injury)   Essential hypertension   COPD (chronic obstructive pulmonary disease) (HCC)   CAD (coronary artery disease)   Hyperlipemia   GERD   BPH (benign prostatic hyperplasia)   Hypotension   Chronic respiratory failure with hypoxia (HCC)   PAD (peripheral artery disease)   History of CVA (cerebrovascular accident)   TOBACCO ABUSE   Hypokalemia   #1 sepsis due to pneumonia: Patient met sepsis criteria with initial temperature of 101 at home.  No leukocytosis but has tachycardia.  Evidence of pneumonia.  Patient has severe sepsis with hypotension.  Lactic acid is however 1.4.   - Continue IV Rocephin  and Zithromax .  Remains on 4 L oxygen via nasal cannula I had discussion with his POA/[name] who shared that his behaviors are concerning and he is getting aggressive at times and they cannot take care of him at home. POA is seeking nursing home placement.  ICM working on placement - MRI of the brain negative for any acute pathology.     #2 severe hypokalemia: Repleted   #3 AKI: Likely prerenal.  Resolved with hydration   #4 normocytic anemia: Probably due to chronic disease.  Continue to monitor.   #5 essential hypertension: Continue Norvasc , Imdur , Aldactone    #6 COPD: No clear exacerbation.  Continue to monitor   #7 coronary artery disease: Stable at baseline   #8  GERD: Continue PPIs   #9 hyperlipidemia: Continue with statin, check CK due to recurrent fall   #10 BPH: Continue home regimen  11.  Chronic pain: Patient is receiving Roxicodone  15 mg every 4 hours scheduled.      DVT prophylaxis: Lovenox  enoxaparin  (LOVENOX ) injection 40 mg Start: 10/13/24 0800     Code Status: Full code Family Communication: Updated POA/dawn and ex-wife at bedside Disposition Plan: Patient is medically stable POA is seeking nursing home placement.  ICM working on it     Antimicrobials:  Rocephin  Azithromycin    Subjective:  Sitting in the chair.  Denies any new complaints  Objective: Vitals:   10/15/24 2145 10/16/24 0344 10/16/24 0810 10/16/24 1558  BP: 129/76 123/74 (!) 176/89 129/65  Pulse: 72 70 64 70  Resp: 18 18 16 16   Temp: 98 F (36.7 C) 98 F (36.7 C) 98.1 F (36.7 C) 98 F (36.7 C)  TempSrc: Oral Oral    SpO2: 99% 99% 99% 100%  Weight:      Height:        Intake/Output Summary (Last 24 hours) at 10/16/2024 1653 Last data filed at 10/16/2024 1424 Gross per 24 hour  Intake 720 ml  Output 1975 ml  Net -1255 ml   Filed Weights   10/12/24 2250  Weight: 84.7 kg    Examination:  General exam: Appears calm and comfortable  Respiratory system: Clear to auscultation. Respiratory effort normal. Cardiovascular system: S1 & S2 heard, RRR. No murmurs. No pedal  edema. Gastrointestinal system: Abdomen is soft, benign Central nervous system: Sleepy. No focal neurological deficits. Extremities: Symmetric 5 x 5 power. Skin: No rashes, lesions or ulcers Psychiatry: Judgement and insight appear normal. Mood & affect appropriate.     Data Reviewed: I have personally reviewed following labs and imaging studies  CBC: Recent Labs  Lab 10/11/24 1252 10/12/24 1845 10/13/24 0438 10/14/24 0534 10/15/24 0332  WBC 11.8* 9.8 5.7 5.3 5.4  NEUTROABS 8.6* 7.0  --   --   --   HGB 9.6* 8.5* 6.7* 7.8* 8.0*  HCT 30.9* 27.6* 21.7* 25.0* 25.6*   MCV 84.9 85.7 85.1 85.6 84.5  PLT 240 244 197 239 280   Basic Metabolic Panel: Recent Labs  Lab 10/12/24 1845 10/13/24 0438 10/13/24 1119 10/14/24 0534 10/15/24 0332 10/16/24 0408  NA 136 139  --  140 140 139  K 2.7* 2.5* 3.2* 3.2* 3.7 3.7  CL 90* 96*  --  99 98 100  CO2 34* 35*  --  37* 36* 32  GLUCOSE 106* 92  --  91 87 101*  BUN 18 15  --  9 10 9   CREATININE 1.65* 1.19  --  0.77 0.81 0.83  CALCIUM  8.9 8.4*  --  8.6* 8.9 9.3  MG 1.7  --  2.2 1.9 1.7  --   PHOS  --   --   --   --  2.5 3.0   GFR: Estimated Creatinine Clearance: 90.7 mL/min (by C-G formula based on SCr of 0.83 mg/dL). Liver Function Tests: Recent Labs  Lab 10/11/24 1252 10/12/24 1845 10/13/24 0438 10/14/24 0534  AST 27 37 26 23  ALT 23 20 15 12   ALKPHOS 145* 113 90 89  BILITOT 0.8 0.5 0.4 0.3  PROT 7.4 6.8 5.6* 5.8*  ALBUMIN 4.0 3.5 2.9* 3.0*   No results for input(s): LIPASE, AMYLASE in the last 168 hours. No results for input(s): AMMONIA in the last 168 hours. Coagulation Profile: Recent Labs  Lab 10/12/24 1829 10/13/24 0438  INR 1.3* 1.3*    Sepsis Labs: Recent Labs  Lab 10/12/24 1829  LATICACIDVEN 1.4    Recent Results (from the past 240 hours)  Blood Culture (routine x 2)     Status: None (Preliminary result)   Collection Time: 10/12/24  6:29 PM   Specimen: BLOOD  Result Value Ref Range Status   Specimen Description BLOOD LEFT ANTECUBITAL  Final   Special Requests   Final    BOTTLES DRAWN AEROBIC AND ANAEROBIC Blood Culture adequate volume   Culture   Final    NO GROWTH 4 DAYS Performed at [facility], [address] [place], [place] [address]    Report Status PENDING  Incomplete  Blood Culture (routine x 2)     Status: None (Preliminary result)   Collection Time: 10/12/24  6:29 PM   Specimen: BLOOD  Result Value Ref Range Status   Specimen Description BLOOD RIGHT ANTECUBITAL  Final   Special Requests   Final    BOTTLES DRAWN AEROBIC AND ANAEROBIC  Blood Culture adequate volume   Culture   Final    NO GROWTH 4 DAYS Performed at [facility], [address] [place], [place] [address]    Report Status PENDING  Incomplete  Resp panel by RT-PCR (RSV, Flu A&B, Covid) Anterior Nasal Swab     Status: None   Collection Time: 10/12/24  6:45 PM   Specimen: Anterior Nasal Swab  Result Value Ref Range Status  SARS Coronavirus 2 by RT PCR NEGATIVE NEGATIVE Final    Comment: (NOTE) SARS-CoV-2 target nucleic acids are NOT DETECTED.  The SARS-CoV-2 RNA is generally detectable in upper respiratory specimens during the acute phase of infection. The lowest concentration of SARS-CoV-2 viral copies this assay can detect is 138 copies/mL. A negative result does not preclude SARS-Cov-2 infection and should not be used as the sole basis for treatment or other patient management decisions. A negative result may occur with  improper specimen collection/handling, submission of specimen other than nasopharyngeal swab, presence of viral mutation(s) within the areas targeted by this assay, and inadequate number of viral copies(<138 copies/mL). A negative result must be combined with clinical observations, patient history, and epidemiological information. The expected result is Negative.  Fact Sheet for Patients:  bloggercourse.com  Fact Sheet for Healthcare Providers:  seriousbroker.it  This test is no t yet approved or cleared by the [place]  FDA and  has been authorized for detection and/or diagnosis of SARS-CoV-2 by FDA under an Emergency Use Authorization (EUA). This EUA will remain  in effect (meaning this test can be used) for the duration of the COVID-19 declaration under Section 564(b)(1) of the Act, 21 U.S.C.section 360bbb-3(b)(1), unless the authorization is terminated  or revoked sooner.       Influenza A by PCR NEGATIVE NEGATIVE Final   Influenza B by PCR NEGATIVE  NEGATIVE Final    Comment: (NOTE) The Xpert Xpress SARS-CoV-2/FLU/RSV plus assay is intended as an aid in the diagnosis of influenza from Nasopharyngeal swab specimens and should not be used as a sole basis for treatment. Nasal washings and aspirates are unacceptable for Xpert Xpress SARS-CoV-2/FLU/RSV testing.  Fact Sheet for Patients: bloggercourse.com  Fact Sheet for Healthcare Providers: seriousbroker.it  This test is not yet approved or cleared by the [place]  FDA and has been authorized for detection and/or diagnosis of SARS-CoV-2 by FDA under an Emergency Use Authorization (EUA). This EUA will remain in effect (meaning this test can be used) for the duration of the COVID-19 declaration under Section 564(b)(1) of the Act, 21 U.S.C. section 360bbb-3(b)(1), unless the authorization is terminated or revoked.     Resp Syncytial Virus by PCR NEGATIVE NEGATIVE Final    Comment: (NOTE) Fact Sheet for Patients: bloggercourse.com  Fact Sheet for Healthcare Providers: seriousbroker.it  This test is not yet approved or cleared by the [place]  FDA and has been authorized for detection and/or diagnosis of SARS-CoV-2 by FDA under an Emergency Use Authorization (EUA). This EUA will remain in effect (meaning this test can be used) for the duration of the COVID-19 declaration under Section 564(b)(1) of the Act, 21 U.S.C. section 360bbb-3(b)(1), unless the authorization is terminated or revoked.  Performed at [facility], [address], [place], [place] [address]          Radiology Studies: MR BRAIN WO CONTRAST Result Date: 10/14/2024 EXAM: MRI BRAIN WITHOUT CONTRAST 10/14/2024 09:36:39 PM TECHNIQUE: Multiplanar multisequence MRI of the head/brain was performed without the administration of intravenous contrast. COMPARISON: MR Head Without IV Contrast  12/22/2023. CLINICAL HISTORY: Mental status change, unknown cause. FINDINGS: BRAIN AND VENTRICLES: No acute infarct. No intracranial hemorrhage. No mass. No midline shift. No hydrocephalus. Multifocal hyperintense T2-weighted signal within the cerebral white matter, most commonly due to chronic small vessel disease. The sella is unremarkable. Normal flow voids. ORBITS: No acute abnormality. SINUSES AND MASTOIDS: Fluid in the left mastoid process. Normal nasopharynx. BONES AND SOFT TISSUES: Normal marrow signal. No acute soft  tissue abnormality. IMPRESSION: 1. No acute intracranial abnormality. 2. Multifocal T2 hyperintensities in the cerebral white matter, most consistent with chronic small vessel ischemic changes. Electronically signed by: Franky Stanford MD 10/14/2024 09:57 PM EST RP Workstation: [id number]        Scheduled Meds:  [START ON 10/17/2024] amLODipine   10 mg Oral Daily   aspirin  EC  81 mg Oral Daily   atorvastatin   80 mg Oral Q breakfast   azithromycin   500 mg Oral Daily   budesonide -glycopyrrolate -formoterol   2 puff Inhalation Daily   cetirizine   10 mg Oral QHS   clopidogrel   75 mg Oral Daily   colchicine   0.6 mg Oral Daily   enoxaparin  (LOVENOX ) injection  40 mg Subcutaneous Q24H   famotidine   40 mg Oral Daily   Fe Fum-Vit C-Vit B12-FA  1 capsule Oral QPC breakfast   FLUoxetine   40 mg Oral Daily   isosorbide  mononitrate  30 mg Oral Daily   linaclotide   290 mcg Oral QAC breakfast   montelukast   10 mg Oral Daily   multivitamin with minerals  1 tablet Oral Daily   oxyCODONE   15 mg Oral Q4H   pantoprazole   40 mg Oral BID AC   potassium chloride  SA  20 mEq Oral BID   rOPINIRole   1 mg Oral QHS   [START ON 10/17/2024] spironolactone   50 mg Oral Daily   tamsulosin   0.4 mg Oral Daily   Continuous Infusions:  cefTRIAXone  (ROCEPHIN )  IV 2 g (10/16/24 0923)     LOS: 4 days    Time spent: 35 minutes    Cresencio Fairly, MD [facility] Pager [phone]  If 7PM-7AM,  please contact night-coverage [email or web address]  10/16/2024, 4:53 PM

## 2024-10-16 NOTE — Plan of Care (Signed)

## 2024-10-16 NOTE — Progress Notes (Signed)
 Physical Therapy Treatment Patient Details Name: Bradley Sawyer MRN: [id number] DOB: 1956-09-04 Today's Date: 10/16/2024   History of Present Illness 68 y.o. male with medical history significant of essential hypertension, GERD, COPD, history of CVA, hyperlipidemia, osteoarthritis, anxiety disorder, iron deficiency anemia, gout, who presents to the ER with shortness of breath cough and fever    PT Comments  Pt was asleep, side lying in bed upon arrival. He easily awake and is agreeable to session. Pt was on 3 L o2 at rest with sao2 96%. Safely demonstrated abilities to exit bed, stand to RW and tolerate ambulation ~ 80 ft. Does desat to 89% on 3 L so author increased to 4 L to continue ambulation efforts. Pt attempted gait with single UE support and then no UE support. Max encouragement for pt to use a AD at DC.  My son doesn't want me to fall in front of my grand kids ever again. Educated on how RW can assist with that. Pt does wear CAM boot RLE for recent gout flare. No order/ record in chart for how long he must wear. If deemed not needed by MDs, dino feels he would have even better balance without CAM boot by wearing just his normal shoes.  DC recs remain appropriate. Acute PT will continue to follow per current POC.    If plan is discharge home, recommend the following: A little help with walking and/or transfers;A little help with bathing/dressing/bathroom;Help with stairs or ramp for entrance;Assist for transportation     Equipment Recommendations  Rolling walker (2 wheels)       Precautions / Restrictions Precautions Precautions: Fall Recall of Precautions/Restrictions: Impaired Required Braces or Orthoses: Other Brace (CAM boot. trying to fiond out how long he needs to wear it for.) Restrictions Weight Bearing Restrictions Per Provider Order: Yes RLE Weight Bearing Per Provider Order: Weight bearing as tolerated     Mobility  Bed Mobility Overal bed mobility: Needs  Assistance Bed Mobility: Supine to Sit, Sit to Supine  Supine to sit: Supervision Sit to supine: Supervision General bed mobility comments: increased time/effort    Transfers Overall transfer level: Needs assistance Equipment used: None, Rolling walker (2 wheels) Transfers: Sit to/from Stand Sit to Stand: Contact guard assist, Supervision  General transfer comment: CGA without AD. supervision with RW    Ambulation/Gait Ambulation/Gait assistance: Contact guard assist, Supervision Gait Distance (Feet): 80 Feet Assistive device: Rolling walker (2 wheels), 1 person hand held assist, None Gait Pattern/deviations: Step-through pattern, Decreased stride length, Decreased weight shift to right  General Gait Details: Pt ambulated ~ 80 ft with RW without LOB however poor posture. he also ambulated 1 x 20 ft with HHA +1 ans 1 x 20 ft withotu UE support. max encouragement for pt to use RW/AD at DC    Balance Overall balance assessment: Needs assistance, History of Falls Sitting-balance support: Feet supported, Bilateral upper extremity supported Sitting balance-Leahy Scale: Normal     Standing balance support: No upper extremity supported, During functional activity Standing balance-Leahy Scale: Poor Standing balance comment: with UE support fair, without UE support poor       Communication Communication Communication: No apparent difficulties  Cognition Arousal: Alert Behavior During Therapy: WFL for tasks assessed/performed   PT - Cognitive impairments: No apparent impairments    PT - Cognition Comments: Pt is alert and cooperative. no family present to determine baseline cogntion. pt does have slow processing but remains coopewrative and able to follow commands consistently. He knew he was  given CAM boot for gout and that he tends to be between 3-4 L o2 at home Following commands: Intact      Cueing Cueing Techniques: Verbal cues         Pertinent Vitals/Pain Pain  Assessment Pain Assessment: No/denies pain Pain Score: 0-No pain Pain Intervention(s): Limited activity within patient's tolerance, Monitored during session     PT Goals (current goals can now be found in the care plan section) Acute Rehab PT Goals Patient Stated Goal: Pt wants to go home Progress towards PT goals: Progressing toward goals    Frequency    Min 1X/week       AM-PAC PT 6 Clicks Mobility   Outcome Measure  Help needed turning from your back to your side while in a flat bed without using bedrails?: None Help needed moving from lying on your back to sitting on the side of a flat bed without using bedrails?: None Help needed moving to and from a bed to a chair (including a wheelchair)?: None Help needed standing up from a chair using your arms (e.g., wheelchair or bedside chair)?: None Help needed to walk in hospital room?: A Little Help needed climbing 3-5 steps with a railing? : A Little 6 Click Score: 22    End of Session Equipment Utilized During Treatment: Oxygen (3L increased to 4 L during ambulation to maintain > 90%. returned to 3 L once seated and back in bed after OOB activity) Activity Tolerance: Patient limited by fatigue;Patient tolerated treatment well Patient left: in bed;with call bell/phone within reach;with bed alarm set Nurse Communication: Mobility status PT Visit Diagnosis: Other abnormalities of gait and mobility (R26.89);Muscle weakness (generalized) (M62.81);Difficulty in walking, not elsewhere classified (R26.2)     Time: 8377-8363 PT Time Calculation (min) (ACUTE ONLY): 14 min  Charges:    $Gait Training: 8-22 mins PT General Charges $$ ACUTE PT VISIT: 1 Visit                     Rankin Essex PTA 10/16/24, 4:56 PM

## 2024-10-16 NOTE — Consult Note (Signed)
 PHARMACY CONSULT NOTE - ELECTROLYTES  Pharmacy Consult for Electrolyte Monitoring and Replacement   Recent Labs: Height: 5' 11 (180.3 cm) Weight: 84.7 kg (186 lb 11.7 oz) IBW/kg (Calculated) : 75.3 Estimated Creatinine Clearance: 90.7 mL/min (by C-G formula based on SCr of 0.83 mg/dL). Potassium (mmol/L)  Date Value  10/16/2024 3.7  07/01/2013 3.2 (L)   Magnesium  (mg/dL)  Date Value  88/75/7974 1.7   Calcium  (mg/dL)  Date Value  88/74/7974 9.3   Calcium , Total (mg/dL)  Date Value  91/89/7985 9.5   Albumin (g/dL)  Date Value  88/76/7974 3.0 (L)  06/27/2013 3.6   Phosphorus (mg/dL)  Date Value  88/74/7974 3.0   Sodium (mmol/L)  Date Value  10/16/2024 139  07/01/2013 136   Corrected Ca: 9.4 mg/dL  Assessment  Bradley Sawyer is a 68 y.o. male presenting with sepsis due to pneumonia. PMH significant for HTN, GERD, COPD, CVA, HLD, OA, and axiety. Pharmacy has been consulted to monitor and replace electrolytes.  Diet: heart healthy MIVF: N/A Pertinent medications: spironolactone , potassium chloride  20mEq PO twice daily  Goal of Therapy: Electrolytes WNL  Plan:  No electrolyte replacement currently warranted Check BMP, Mg with AM labs  Thank you for allowing pharmacy to be a part of this patient's care.  Will M. Lenon, PharmD, BCPS Clinical Pharmacist 10/16/2024 7:43 AM

## 2024-10-17 LAB — CULTURE, BLOOD (ROUTINE X 2)
Culture: NO GROWTH
Culture: NO GROWTH
Special Requests: ADEQUATE
Special Requests: ADEQUATE

## 2024-10-17 LAB — BASIC METABOLIC PANEL WITH GFR
Anion gap: 8 (ref 5–15)
BUN: 9 mg/dL (ref 8–23)
CO2: 29 mmol/L (ref 22–32)
Calcium: 9.3 mg/dL (ref 8.9–10.3)
Chloride: 101 mmol/L (ref 98–111)
Creatinine, Ser: 0.85 mg/dL (ref 0.61–1.24)
GFR, Estimated: >60 mL/min (ref >60)
Glucose, Bld: 101 mg/dL — ABNORMAL HIGH (ref 70–99)
Potassium: 4.2 mmol/L (ref 3.5–5.1)
Sodium: 138 mmol/L (ref 135–145)

## 2024-10-17 LAB — MAGNESIUM: Magnesium: 1.8 mg/dL (ref 1.7–2.4)

## 2024-10-17 MED ORDER — VITAMIN B-12 1000 MCG PO TABS
1000.0000 ug | ORAL_TABLET | Freq: Every day | ORAL | Status: DC
  Administered 2024-10-17: 1000 ug via ORAL
  Filled 2024-10-17: qty 1

## 2024-10-17 MED ORDER — OXYCODONE HCL 15 MG PO TABS: 15.0000 mg | ORAL_TABLET | ORAL | 0 refills | Status: AC

## 2024-10-17 MED ORDER — AMLODIPINE BESYLATE 10 MG PO TABS: 10.0000 mg | ORAL_TABLET | Freq: Every day | ORAL | Status: DC

## 2024-10-17 MED ORDER — MAGNESIUM SULFATE 2 GM/50ML IV SOLN
2.0000 g | Freq: Once | INTRAVENOUS | Status: AC
  Administered 2024-10-17: 2 g via INTRAVENOUS
  Filled 2024-10-17: qty 50

## 2024-10-17 MED ORDER — FERROUS SULFATE 325 (65 FE) MG PO TABS: 325.0000 mg | ORAL_TABLET | Freq: Every day | ORAL | Status: AC

## 2024-10-17 MED ORDER — FERROUS SULFATE 325 (65 FE) MG PO TABS: 325.0000 mg | ORAL_TABLET | Freq: Every day | ORAL | Status: DC

## 2024-10-17 MED ORDER — CYANOCOBALAMIN 1000 MCG PO TABS: 1000.0000 ug | ORAL_TABLET | Freq: Every day | ORAL | Status: AC

## 2024-10-17 NOTE — TOC Transition Note (Signed)
 Transition of Care [facility]) - Discharge Note   Patient Details  Name: Bradley Sawyer MRN: [id number] Date of Birth: 11/27/1955  [facility] CM/SW Contact:  Alvaro Louder, LCSW Phone Number: 10/17/2024, 1:43 PM   Clinical Narrative:   LCSWA received insurance approval for patient to admit to SNF [facility]. LCSWA confirmed with MD that patient is stable for discharge. LCSWA notified the patient and they are in agreement with discharge. LCSWA confirmed bed is available at SNF. Transport arranged with [facility] for next available.  Number to call report [phone] RM: E13   TOC signing off    Final next level of care: Skilled Nursing Facility Barriers to Discharge: No Barriers Identified   Patient Goals and CMS Choice            Discharge Placement              Patient chooses bed at:  ([facility]) Patient to be transferred to facility by: [facility] Name of family member notified: [name] Patient and family notified of of transfer: 10/17/24  Discharge Plan and Services Additional resources added to the After Visit Summary for       Post Acute Care Choice: Skilled Nursing Facility                               Social Drivers of Health (SDOH) Interventions SDOH Screenings   Food Insecurity: No Food Insecurity (10/12/2024)  Housing: Low Risk  (10/12/2024)  Transportation Needs: No Transportation Needs (10/12/2024)  Utilities: Not At Risk (10/12/2024)  Depression (PHQ2-9): Medium Risk (01/22/2022)  Financial Resource Strain: Low Risk  (12/19/2023)   Received from [facility]  Physical Activity: Unknown (10/31/2023)   Received from [facility]  Social Connections: Socially Isolated (10/12/2024)  Stress: No Stress Concern Present (10/31/2023)   Received from [facility]  Tobacco Use: Medium Risk (10/12/2024)     Readmission Risk Interventions     No data to display

## 2024-10-17 NOTE — Progress Notes (Signed)
 Report given to Bascom Ned RN at [facility].

## 2024-10-17 NOTE — Consult Note (Signed)
 PHARMACY CONSULT NOTE - ELECTROLYTES  Pharmacy Consult for Electrolyte Monitoring and Replacement   Recent Labs: Height: 5' 11 (180.3 cm) Weight: 84.7 kg (186 lb 11.7 oz) IBW/kg (Calculated) : 75.3 Estimated Creatinine Clearance: 88.6 mL/min (by C-G formula based on SCr of 0.85 mg/dL). Potassium (mmol/L)  Date Value  10/17/2024 4.2  07/01/2013 3.2 (L)   Magnesium  (mg/dL)  Date Value  88/73/7974 1.8   Calcium  (mg/dL)  Date Value  88/73/7974 9.3   Calcium , Total (mg/dL)  Date Value  91/89/7985 9.5   Albumin (g/dL)  Date Value  88/76/7974 3.0 (L)  06/27/2013 3.6   Phosphorus (mg/dL)  Date Value  88/74/7974 3.0   Sodium (mmol/L)  Date Value  10/17/2024 138  07/01/2013 136   Corrected Ca: 9.4 mg/dL  Assessment  Bradley Sawyer is a 68 y.o. male presenting with sepsis due to pneumonia. PMH significant for HTN, GERD, COPD, CVA, HLD, OA, and axiety. Pharmacy has been consulted to monitor and replace electrolytes.  Diet: heart healthy MIVF: N/A Pertinent medications: spironolactone , potassium chloride  20mEq PO twice daily  Goal of Therapy: Electrolytes WNL  Plan:  Mag 1.8, will give Mag sulfate 2 g IV x 1 Check BMP, Mg with AM labs  Thank you for allowing pharmacy to be a part of this patient's care.  Lum Mania, PharmD, BCPS Clinical Pharmacist 10/17/2024 8:48 AM

## 2024-10-17 NOTE — Progress Notes (Signed)
   10/17/24 1330  Spiritual Encounters  Type of Visit Initial  Care provided to: Patient  Conversation partners present during encounter Nurse  Referral source Other (comment) (Staff)  Reason for visit Routine spiritual support  OnCall Visit Yes   Chaplain was referred to patient for spiritual care by staff member while she was rounding on the Unit.  Patient was happy to see the Chaplain but said he was being discharged.  Patient was happy to go to a facility where he could get his legs stronger.  Chaplain celebrated this with patient.    Rev. Rana M. Nicholaus, M.Div. Chaplain Resident [facility]

## 2024-10-17 NOTE — Progress Notes (Addendum)
 Physical Therapy Treatment Patient Details Name: Bradley Sawyer MRN: [id number] DOB: Feb 06, 1956 Today's Date: 10/17/2024   History of Present Illness 68 y.o. male with medical history significant of essential hypertension, GERD, COPD, history of CVA, hyperlipidemia, osteoarthritis, anxiety disorder, iron deficiency anemia, gout, who presents to the ER with shortness of breath cough and fever    PT Comments  Pt ready for session.  He is able to get to/from EOB with supervision.  Light assist to don/doff shoes and CAM boot.  He stands to RW and needs cues throughout session for proper hand placement as he tries to pull up on walker and often forgets to reach back.  Pt is able to walk 40' x 2 , 15' x 1 with RW and cga/min a x1 for cues and to manage O2 tank.  He did fatigue quicker today and needed seated rest in hallway to safely return to room.  Pt  presents with decreased step length and height, heavy forward lean on walker and difficulty safely placing RLE due to bulk and weight of CAM boot.  Stated he wears O2 at all times at 4 lpm at home.  Sats remains 94% or higher with gait but some SOB and fatigue is noted.  CAM boot does make gait more precarious at times and is a fall hazzard.  Per last podiatry note he could transition out with supportive shoes.  Pt does not have any other footwear options here and stated he does not have any at home.  Finances do seem to be an issue.  Pt was encouraged to have family purchase a pair of supportive sneakers (even from [employer]) but pt stated that would have to wait until the 1st of the month as he has no more money.  Family does seem supportive per discussion.  He does need to use bathroom for large loose BM during session and while he is able to mange his care he does need light assist and would struggle to manage any inc issues at home.  Pt benefits from +1 assist for mobility at all timed for general safety.   Pt stated he did not use walker at home up until  about a month ago where he started experiencing weakness and frequent falls.  Has a walker form a friend that he has been using.  He stated he tried to walk without one yesterday but it was scary.  Agree with self assessment that walking without AD would be challenging and increase fall risk.  Pt stated he has felt progressively weaker and has been struggling more at home the past few weeks.  While family is supportive they are not available at all times.  He asks about rehab upon discharge stating he feels if he had a few weeks of exercise and to learn what to do at home that he would have an easier time.  When asked what HEP he does he said he has never done any exercises but is interested in learning more to stay independent at home.  Given pt's motivation to improve and return to baseline, his more frequent falls, need for increased assist at home and lack of 24 hours support and balance and strength deficits shown he would be appropriate for transition to <3 hrs a day therapy at discharge.     If plan is discharge home, recommend the following: A little help with walking and/or transfers;A little help with bathing/dressing/bathroom;Help with stairs or ramp for entrance;Assist for transportation   Can travel  by private [employer] (2 wheels)    Recommendations for Other Services       Precautions / Restrictions Precautions Precautions: Fall Recall of Precautions/Restrictions: Impaired Required Braces or Orthoses: Other Brace (CAM boot. trying to fiond out how long he needs to wear it for.) Restrictions Weight Bearing Restrictions Per Provider Order: Yes RLE Weight Bearing Per Provider Order: Weight bearing as tolerated     Mobility  Bed Mobility Overal bed mobility: Needs Assistance Bed Mobility: Supine to Sit, Sit to Supine     Supine to sit: Supervision Sit to supine: Supervision     Patient Response:  Cooperative  Transfers Overall transfer level: Needs assistance Equipment used: Rolling walker (2 wheels) Transfers: Sit to/from Stand Sit to Stand: Min assist           General transfer comment: cues for hand placement as he oftern tries to pull up on walker.    Ambulation/Gait Ambulation/Gait assistance: Contact guard assist, Min assist Gait Distance (Feet): 40 Feet Assistive device: Rolling walker (2 wheels) Gait Pattern/deviations: Step-through pattern, Decreased step length - right, Decreased step length - left Gait velocity: dec     General Gait Details: heavy forward lean on walker, generally unsteady at times with CAM boot negatively affecting gait   Stairs             Wheelchair Mobility     Tilt Bed Tilt Bed Patient Response: Cooperative  Modified Rankin (Stroke Patients Only)       Balance Overall balance assessment: Needs assistance, History of Falls Sitting-balance support: Feet supported, Bilateral upper extremity supported Sitting balance-Leahy Scale: Normal     Standing balance support: Bilateral upper extremity supported Standing balance-Leahy Scale: Poor Standing balance comment: with UE support fair, without UE support poor                            Communication Communication Communication: No apparent difficulties Factors Affecting Communication: Hearing impaired;Reduced clarity of speech  Cognition Arousal: Alert Behavior During Therapy: WFL for tasks assessed/performed   PT - Cognitive impairments: No family/caregiver present to determine baseline                         Following commands: Intact Following commands impaired: Follows one step commands with increased time    Cueing Cueing Techniques: Verbal cues  Exercises Other Exercises Other Exercises: to bathroom for loose BM    General Comments        Pertinent Vitals/Pain Pain Assessment Pain Assessment: Faces Faces Pain Scale: Hurts a  little bit Pain Location: general soreness from immobility Pain Descriptors / Indicators: Sore Pain Intervention(s): Limited activity within patient's tolerance, Monitored during session, Repositioned    Home Living                          Prior Function            PT Goals (current goals can now be found in the care plan section) Progress towards PT goals: Progressing toward goals    Frequency    Min 1X/week      PT Plan      Co-evaluation              AM-PAC PT 6 Clicks Mobility   Outcome Measure  Help needed turning from your back to your  side while in a flat bed without using bedrails?: None Help needed moving from lying on your back to sitting on the side of a flat bed without using bedrails?: None Help needed moving to and from a bed to a chair (including a wheelchair)?: A Little Help needed standing up from a chair using your arms (e.g., wheelchair or bedside chair)?: A Little Help needed to walk in hospital room?: A Little Help needed climbing 3-5 steps with a railing? : A Lot 6 Click Score: 19    End of Session Equipment Utilized During Treatment: Oxygen;Gait belt (3L increased to 4 L during ambulation to maintain > 90%. returned to 3 L once seated and back in bed after OOB activity) Activity Tolerance: Patient limited by fatigue;Patient tolerated treatment well Patient left: in bed;with call bell/phone within reach;with bed alarm set Nurse Communication: Mobility status PT Visit Diagnosis: Other abnormalities of gait and mobility (R26.89);Muscle weakness (generalized) (M62.81);Difficulty in walking, not elsewhere classified (R26.2)     Time: 8981-8954 PT Time Calculation (min) (ACUTE ONLY): 27 min  Charges:    $Gait Training: 23-37 mins PT General Charges $$ ACUTE PT VISIT: 1 Visit                    Lauraine Gills, PTA 10/17/24, 11:04 AM

## 2024-10-17 NOTE — Discharge Summary (Signed)
 Physician Discharge Summary   Patient: Bradley Sawyer MRN: [id number] DOB: 1956-02-19  Admit date:     10/12/2024  Discharge date: 10/17/24  Discharge Physician: Murvin Mana   PCP: Corrington, Kip A, MD   Recommendations at discharge:    Follow up with PCP in 1 week Follow-up on homocystine level, needed continue B12 supplement if level elevated.  Discharge Diagnoses: Principal Problem:   Sepsis due to pneumonia [facility]) Active Problems:   AKI (acute kidney injury)   Essential hypertension   COPD (chronic obstructive pulmonary disease) (HCC)   CAD (coronary artery disease)   Hyperlipemia   GERD   BPH (benign prostatic hyperplasia)   Hypotension   Chronic respiratory failure with hypoxia (HCC)   PAD (peripheral artery disease)   History of CVA (cerebrovascular accident)   TOBACCO ABUSE   Hypokalemia  Resolved Problems:   * No resolved hospital problems. *  Hospital Course: 68 y.o. male with medical history significant of essential hypertension, GERD, COPD, history of CVA, hyperlipidemia, osteoarthritis, anxiety disorder, iron deficiency anemia, gout, who presents to the ER with shortness of breath cough and fever    11/22: PT and OT consult, palliative care consult 11/23: MRI brain negative 11/24-11/25: Family demanding placement  Assessment and Plan: #1 sepsis due to pneumonia: Patient met sepsis criteria with initial temperature of 101 at home.  No leukocytosis but has tachycardia.  Evidence of pneumonia.  Patient has severe sepsis with hypotension.  Lactic acid is however 1.4.   Condition has improved, completed antibiotics.  Aggressive behavior. - MRI of the brain negative for any acute pathology.     #2 severe hypokalemia:  Potassium normalized.   #3 AKI Hypokalemia. Condition all resolved.   Iron deficient anemia. Recent iron study showed iron deficiency, started iron supplement.  B12 level borderline low at 226, check homocystine level.  Started B12  supplement.   #5 essential hypertension: Continue Norvasc , Imdur , Aldactone    #6 COPD Chronic hypoxemic respiratory failure. : No clear exacerbation.     #7 coronary artery disease:  Stable at baseline   #8 GERD: Continue PPIs   #9 hyperlipidemia:  Continue statin.   #10 BPH: Continue home regimen   11.  Chronic pain: Resume home treatment.         Consultants: None Procedures performed: None  Disposition: Skilled nursing facility Diet recommendation:  Discharge Diet Orders (From admission, onward)     Start     Ordered   10/17/24 0000  Diet - low sodium heart healthy        10/17/24 1231           Cardiac diet DISCHARGE MEDICATION: Allergies as of 10/17/2024       Reactions   Bee Venom Swelling   Tapentadol Shortness Of Breath   Other Reaction(s): sob/tachycardia        Medication List     STOP taking these medications    Fe Fum-Vit C-Vit B12-FA Caps capsule Commonly known as: TRIGELS-F FORTE   fluticasone -salmeterol 250-50 MCG/ACT Aepb Commonly known as: ADVAIR   furosemide  20 MG tablet Commonly known as: LASIX    ondansetron  4 MG tablet Commonly known as: ZOFRAN    predniSONE  10 MG (21) Tbpk tablet Commonly known as: STERAPRED UNI-PAK 21 TAB   traMADol  50 MG tablet Commonly known as: ULTRAM        TAKE these medications    albuterol  108 (90 Base) MCG/ACT inhaler Commonly known as: VENTOLIN  HFA INHALE 2 PUFFS BY MOUTH EVERY 4-6 HOURS AS  NEEDED FOR WHEEZING What changed: Another medication with the same name was removed. Continue taking this medication, and follow the directions you see here.   amLODipine  10 MG tablet Commonly known as: NORVASC  Take 1 tablet (10 mg total) by mouth daily. What changed:  medication strength how much to take   aspirin  EC 81 MG tablet Take 81 mg by mouth daily.   atorvastatin  80 MG tablet Commonly known as: LIPITOR  TAKE ONE TABLET BY MOUTH ONCE DAILY AT BEDTIME   Breztri  Aerosphere 160-9-4.8  MCG/ACT Aero inhaler Generic drug: budesonide -glycopyrrolate -formoterol  INHALE TWO PUFFS into THE lungs TWICE DAILY morning & bedtime   clopidogrel  75 MG tablet Commonly known as: PLAVIX  TAKE ONE TABLET BY MOUTH ONCE DAILY   colchicine  0.6 MG tablet Take 1 tablet (0.6 mg total) by mouth daily.   cyanocobalamin  1000 MCG tablet Take 1 tablet (1,000 mcg total) by mouth daily. Start taking on: October 18, 2024   cyclobenzaprine  10 MG tablet Commonly known as: FLEXERIL  Take 10 mg by mouth 2 (two) times daily as needed.   EPINEPHrine  0.3 mg/0.3 mL Soaj injection Commonly known as: EPI-PEN Inject 0.3 mg into the muscle as needed for anaphylaxis.   famotidine  40 MG tablet Commonly known as: PEPCID  TAKE ONE TABLET BY MOUTH ONCE DAILY   ferrous sulfate  325 (65 FE) MG tablet Take 1 tablet (325 mg total) by mouth daily with breakfast. Start taking on: October 18, 2024   FLUoxetine  40 MG capsule Commonly known as: PROZAC  Take 40 mg by mouth daily.   ipratropium-albuterol  0.5-2.5 (3) MG/3ML Soln Commonly known as: DUONEB Take 3 mLs by nebulization every 6 (six) hours as needed.   isosorbide  mononitrate 30 MG 24 hr tablet Commonly known as: IMDUR  Take 30 mg by mouth daily.   levocetirizine 5 MG tablet Commonly known as: XYZAL  Take 5 mg by mouth at bedtime.   linaclotide  290 MCG Caps capsule Commonly known as: LINZESS  Take 290 mcg by mouth daily before breakfast.   lisinopril -hydrochlorothiazide  20-25 MG tablet Commonly known as: ZESTORETIC  Take 1 tablet by mouth daily.   metFORMIN  500 MG tablet Commonly known as: GLUCOPHAGE  Take 500 mg by mouth daily.   montelukast  10 MG tablet Commonly known as: SINGULAIR  Take 10 mg by mouth daily.   Multi-Vitamins Tabs Take 1 tablet by mouth daily.   naloxone  4 MG/0.1ML Liqd nasal spray kit Commonly known as: NARCAN  Place 0.4 mg into the nose once.   nitroGLYCERIN  0.4 MG SL tablet Commonly known as: NITROSTAT  DISSOLVE (1)  TABLET UNDER TONGUE AS NEEDED TO RELIEVE CHEST PAIN. MAY REPEAT EVERY 5 MINUTES.   oxyCODONE  15 MG immediate release tablet Commonly known as: ROXICODONE  Take 1 tablet (15 mg total) by mouth every 4 (four) hours.   OXYGEN Inhale 3 L into the lungs.   pantoprazole  40 MG tablet Commonly known as: PROTONIX  Take 1 tablet (40 mg total) by mouth 2 (two) times daily before a meal. LAST REFILL MUST SCHEDULE VISIT   polyethylene glycol 17 g packet Commonly known as: MIRALAX  / GLYCOLAX  Take 17 g by mouth daily as needed (for constipation).   potassium chloride  SA 20 MEQ tablet Commonly known as: KLOR-CON  M Take 1 tablet (20 mEq total) by mouth 2 (two) times daily.   rOPINIRole  1 MG tablet Commonly known as: REQUIP  Take 1 tablet by mouth at bedtime.   spironolactone  25 MG tablet Commonly known as: ALDACTONE  TAKE ONE TABLET BY MOUTH ONCE DAILY   tamsulosin  0.4 MG Caps capsule Commonly known as:  FLOMAX  TAKE ONE CAPSULE BY MOUTH EVERY DAY        Follow-up Information     Corrington, Kip A, MD Follow up in 1 week(s).   Specialty: Family Medicine Contact information: [phone] [address] [place] [address] [phone]                Discharge Exam: Fredricka Weights   10/12/24 2250  Weight: 84.7 kg   General exam: Appears calm and comfortable  Respiratory system: Decreased breath sounds. Respiratory effort normal. Cardiovascular system: S1 & S2 heard, RRR. No JVD, murmurs, rubs, gallops or clicks. No pedal edema. Gastrointestinal system: Abdomen is nondistended, soft and nontender. No organomegaly or masses felt. Normal bowel sounds heard. Central nervous system: Alert and oriented. No focal neurological deficits. Extremities: Symmetric 5 x 5 power. Skin: No rashes, lesions or ulcers Psychiatry: Judgement and insight appear normal. Mood & affect appropriate. '  Condition at discharge: good  The results of significant diagnostics from this hospitalization  (including imaging, microbiology, ancillary and laboratory) are listed below for reference.   Imaging Studies: MR BRAIN WO CONTRAST Result Date: 10/14/2024 EXAM: MRI BRAIN WITHOUT CONTRAST 10/14/2024 09:36:39 PM TECHNIQUE: Multiplanar multisequence MRI of the head/brain was performed without the administration of intravenous contrast. COMPARISON: MR Head Without IV Contrast 12/22/2023. CLINICAL HISTORY: Mental status change, unknown cause. FINDINGS: BRAIN AND VENTRICLES: No acute infarct. No intracranial hemorrhage. No mass. No midline shift. No hydrocephalus. Multifocal hyperintense T2-weighted signal within the cerebral white matter, most commonly due to chronic small vessel disease. The sella is unremarkable. Normal flow voids. ORBITS: No acute abnormality. SINUSES AND MASTOIDS: Fluid in the left mastoid process. Normal nasopharynx. BONES AND SOFT TISSUES: Normal marrow signal. No acute soft tissue abnormality. IMPRESSION: 1. No acute intracranial abnormality. 2. Multifocal T2 hyperintensities in the cerebral white matter, most consistent with chronic small vessel ischemic changes. Electronically signed by: Franky Stanford MD 10/14/2024 09:57 PM EST RP Workstation: [id number]   DG HIPS BILAT WITH PELVIS MIN 5 VIEWS Result Date: 10/12/2024 EXAM: 5 VIEW(S) XRAY OF THE PELVIS AND BILATERAL HIP 10/12/2024 09:35:11 PM COMPARISON: None available. CLINICAL HISTORY: [id number] Fall [id number] FINDINGS: BONES AND JOINTS: SI joints are symmetric. No acute bony abnormalities. Bilateral hips demonstrate normal alignment. Degenerative changes in the hips. Surgical clips in the pelvis. LUMBAR SPINE: Degenerative changes in the lower lumbar spine. SOFT TISSUES: Diffuse vascular calcifications. IMPRESSION: 1. No acute abnormality of the bilateral hips. 2. Degenerative changes in the lower lumbar spine and hips. Electronically signed by: Elsie Gravely MD 10/12/2024 09:38 PM EST RP Workstation: [id number]   CT HEAD WO  CONTRAST Result Date: 10/12/2024 EXAM: CT HEAD AND CERVICAL SPINE 10/12/2024 07:44:37 PM TECHNIQUE: CT of the head and cervical spine was performed without the administration of intravenous contrast. Multiplanar reformatted images are provided for review. Automated exposure control, iterative reconstruction, and/or weight based adjustment of the mA/kV was utilized to reduce the radiation dose to as low as reasonably achievable. COMPARISON: CT head and CT cervical spine 09/06/2024 CLINICAL HISTORY: fall FINDINGS: CT HEAD BRAIN AND VENTRICLES: No acute intracranial hemorrhage. No mass effect or midline shift. No abnormal extra-axial fluid collection. No evidence of acute infarct. No hydrocephalus. ORBITS: No acute abnormality. SINUSES AND MASTOIDS: No acute abnormality. SOFT TISSUES AND SKULL: No acute skull fracture. No acute soft tissue abnormality. CT CERVICAL SPINE BONES AND ALIGNMENT: No acute fracture or traumatic malalignment. Unchanged anterolisthesis of C2 on C3 and reversal of the normal cervical lordosis. Solid C3-C4 interbody  fusion. DEGENERATIVE CHANGES: Facet and uncovertebral hypertrophy with varying degrees of neural foraminal stenosis.[id number] SOFT TISSUES: No prevertebral soft tissue swelling. IMPRESSION: 1. No acute intracranial abnormality. 2. No acute fracture or traumatic malalignment of the cervical spine. Electronically signed by: Gilmore Molt MD 10/12/2024 08:09 PM EST RP Workstation: [id number]   CT Cervical Spine Wo Contrast Result Date: 10/12/2024 EXAM: CT HEAD AND CERVICAL SPINE 10/12/2024 07:44:37 PM TECHNIQUE: CT of the head and cervical spine was performed without the administration of intravenous contrast. Multiplanar reformatted images are provided for review. Automated exposure control, iterative reconstruction, and/or weight based adjustment of the mA/kV was utilized to reduce the radiation dose to as low as reasonably achievable. COMPARISON: CT head and CT cervical  spine 09/06/2024 CLINICAL HISTORY: fall FINDINGS: CT HEAD BRAIN AND VENTRICLES: No acute intracranial hemorrhage. No mass effect or midline shift. No abnormal extra-axial fluid collection. No evidence of acute infarct. No hydrocephalus. ORBITS: No acute abnormality. SINUSES AND MASTOIDS: No acute abnormality. SOFT TISSUES AND SKULL: No acute skull fracture. No acute soft tissue abnormality. CT CERVICAL SPINE BONES AND ALIGNMENT: No acute fracture or traumatic malalignment. Unchanged anterolisthesis of C2 on C3 and reversal of the normal cervical lordosis. Solid C3-C4 interbody fusion. DEGENERATIVE CHANGES: Facet and uncovertebral hypertrophy with varying degrees of neural foraminal stenosis.jdiqdijdijq SOFT TISSUES: No prevertebral soft tissue swelling. IMPRESSION: 1. No acute intracranial abnormality. 2. No acute fracture or traumatic malalignment of the cervical spine. Electronically signed by: Gilmore Molt MD 10/12/2024 08:09 PM EST RP Workstation: [id number]   DG Chest Port 1 View Result Date: 10/12/2024 EXAM: 1 VIEW(S) XRAY OF THE CHEST 10/12/2024 06:29:00 PM COMPARISON: Prior study. CLINICAL HISTORY: Questionable sepsis - evaluate for abnormality. FINDINGS: LUNGS AND PLEURA: Patchy infiltrates in the lung bases are new since the prior study and may represent atelectasis or pneumonia. No pleural effusion or pneumothorax. HEART AND MEDIASTINUM: Heart size and pulmonary vascularity are normal. Mediastinal contours appear intact. Postoperative changes in the mediastinum. BONES AND SOFT TISSUES: Degenerative changes in the spine and shoulders. No acute osseous abnormality. IMPRESSION: 1. New patchy bibasilar airspace opacities, which may represent atelectasis or pneumonia. No pleural effusion or pneumothorax. Electronically signed by: Elsie Gravely MD 10/12/2024 06:32 PM EST RP Workstation: [id number]   CT Angio Chest PE W and/or Wo Contrast Result Date: 09/23/2024 CLINICAL DATA:  Shortness of breath.  Oxygen dependent. Positive D-dimer. Evaluate for pulmonary embolism. EXAM: CT ANGIOGRAPHY CHEST WITH CONTRAST TECHNIQUE: Multidetector CT imaging of the chest was performed using the standard protocol during bolus administration of intravenous contrast. Multiplanar CT image reconstructions and MIPs were obtained to evaluate the vascular anatomy. RADIATION DOSE REDUCTION: This exam was performed according to the departmental dose-optimization program which includes automated exposure control, adjustment of the mA and/or kV according to patient size and/or use of iterative reconstruction technique. CONTRAST:  75mL OMNIPAQUE  IOHEXOL  350 MG/ML SOLN COMPARISON:  03/01/2021 FINDINGS: Cardiovascular: Median sternotomy wires are present. Heart is normal size. Calcified plaque over the left main and 3 vessel coronary arteries. Thoracic aorta is normal in caliber. Mild calcified plaque over the descending thoracic aorta. Pulmonary arterial system is well opacified without evidence of emboli. Remaining vascular structures are unremarkable. Mediastinum/Nodes: No mediastinal or hilar adenopathy. Remaining mediastinal structures are normal. Lungs/Pleura: Lungs are adequately inflated. No acute airspace process or effusion. Right minimal peripheral interstitial prominence over the right apex. Trachea and mainstem bronchi are normal. There is opacification of scaring the proximal right lower lobar bronchi as well as mild material filling a  posterior basilar left lower lobe bronchus. Findings may be due to mucous plugging/aspiration. Upper Abdomen: Calcified plaque over the abdominal aorta. Prior cholecystectomy. No acute findings. Musculoskeletal: No focal abnormality. Partially visualized posterior fusion hardware over the thoracolumbar junction. Review of the MIP images confirms the above findings. IMPRESSION: 1. No evidence of pulmonary embolism. 2. Opacification bilateral lower lobe bronchi. Findings may be due to mucous  plugging/aspiration. 3. Aortic atherosclerosis. Atherosclerotic coronary artery disease. Aortic Atherosclerosis (ICD10-I70.0). Electronically Signed   By: Toribio Agreste M.D.   On: 09/23/2024 12:46   DG Chest 2 View Result Date: 09/23/2024 EXAM: 2 VIEW(S) XRAY OF THE CHEST 09/23/2024 10:19:04 AM COMPARISON: 08/26/2023 CLINICAL HISTORY: SOB SOB FINDINGS: LUNGS AND PLEURA: No focal pulmonary opacity. No pulmonary edema. No pleural effusion. No pneumothorax. HEART AND MEDIASTINUM: No acute abnormality of the cardiac and mediastinal silhouettes. BONES AND SOFT TISSUES: No acute osseous abnormality. IMPRESSION: 1. No acute cardiopulmonary process detected. Electronically signed by: Lynwood Seip MD 09/23/2024 10:22 AM EST RP Workstation: [id number]    Microbiology: Results for orders placed or performed during the hospital encounter of 10/12/24  Blood Culture (routine x 2)     Status: None   Collection Time: 10/12/24  6:29 PM   Specimen: BLOOD  Result Value Ref Range Status   Specimen Description BLOOD LEFT ANTECUBITAL  Final   Special Requests   Final    BOTTLES DRAWN AEROBIC AND ANAEROBIC Blood Culture adequate volume   Culture   Final    NO GROWTH 5 DAYS Performed at [facility], [address] [place], [place] [address]    Report Status 10/17/2024 FINAL  Final  Blood Culture (routine x 2)     Status: None   Collection Time: 10/12/24  6:29 PM   Specimen: BLOOD  Result Value Ref Range Status   Specimen Description BLOOD RIGHT ANTECUBITAL  Final   Special Requests   Final    BOTTLES DRAWN AEROBIC AND ANAEROBIC Blood Culture adequate volume   Culture   Final    NO GROWTH 5 DAYS Performed at [facility], [address] [place], [place] [address]    Report Status 10/17/2024 FINAL  Final  Resp panel by RT-PCR (RSV, Flu A&B, Covid) Anterior Nasal Swab     Status: None   Collection Time: 10/12/24  6:45 PM   Specimen: Anterior Nasal Swab  Result Value Ref Range Status    SARS Coronavirus 2 by RT PCR NEGATIVE NEGATIVE Final    Comment: (NOTE) SARS-CoV-2 target nucleic acids are NOT DETECTED.  The SARS-CoV-2 RNA is generally detectable in upper respiratory specimens during the acute phase of infection. The lowest concentration of SARS-CoV-2 viral copies this assay can detect is 138 copies/mL. A negative result does not preclude SARS-Cov-2 infection and should not be used as the sole basis for treatment or other patient management decisions. A negative result may occur with  improper specimen collection/handling, submission of specimen other than nasopharyngeal swab, presence of viral mutation(s) within the areas targeted by this assay, and inadequate number of viral copies(<138 copies/mL). A negative result must be combined with clinical observations, patient history, and epidemiological information. The expected result is Negative.  Fact Sheet for Patients:  bloggercourse.com  Fact Sheet for Healthcare Providers:  seriousbroker.it  This test is no t yet approved or cleared by the [place]  FDA and  has been authorized for detection and/or diagnosis of SARS-CoV-2 by FDA under an Emergency Use Authorization (EUA). This EUA will remain  in  effect (meaning this test can be used) for the duration of the COVID-19 declaration under Section 564(b)(1) of the Act, 21 U.S.C.section 360bbb-3(b)(1), unless the authorization is terminated  or revoked sooner.       Influenza A by PCR NEGATIVE NEGATIVE Final   Influenza B by PCR NEGATIVE NEGATIVE Final    Comment: (NOTE) The Xpert Xpress SARS-CoV-2/FLU/RSV plus assay is intended as an aid in the diagnosis of influenza from Nasopharyngeal swab specimens and should not be used as a sole basis for treatment. Nasal washings and aspirates are unacceptable for Xpert Xpress SARS-CoV-2/FLU/RSV testing.  Fact Sheet for  Patients: bloggercourse.com  Fact Sheet for Healthcare Providers: seriousbroker.it  This test is not yet approved or cleared by the [place]  FDA and has been authorized for detection and/or diagnosis of SARS-CoV-2 by FDA under an Emergency Use Authorization (EUA). This EUA will remain in effect (meaning this test can be used) for the duration of the COVID-19 declaration under Section 564(b)(1) of the Act, 21 U.S.C. section 360bbb-3(b)(1), unless the authorization is terminated or revoked.     Resp Syncytial Virus by PCR NEGATIVE NEGATIVE Final    Comment: (NOTE) Fact Sheet for Patients: bloggercourse.com  Fact Sheet for Healthcare Providers: seriousbroker.it  This test is not yet approved or cleared by the [place]  FDA and has been authorized for detection and/or diagnosis of SARS-CoV-2 by FDA under an Emergency Use Authorization (EUA). This EUA will remain in effect (meaning this test can be used) for the duration of the COVID-19 declaration under Section 564(b)(1) of the Act, 21 U.S.C. section 360bbb-3(b)(1), unless the authorization is terminated or revoked.  Performed at [facility], [address] [place], [place] [address]     Labs: CBC: Recent Labs  Lab 10/11/24 1252 10/12/24 1845 10/13/24 0438 10/14/24 0534 10/15/24 0332  WBC 11.8* 9.8 5.7 5.3 5.4  NEUTROABS 8.6* 7.0  --   --   --   HGB 9.6* 8.5* 6.7* 7.8* 8.0*  HCT 30.9* 27.6* 21.7* 25.0* 25.6*  MCV 84.9 85.7 85.1 85.6 84.5  PLT 240 244 197 239 280   Basic Metabolic Panel: Recent Labs  Lab 10/12/24 1845 10/13/24 0438 10/13/24 1119 10/14/24 0534 10/15/24 0332 10/16/24 0408 10/17/24 0758  NA 136 139  --  140 140 139 138  K 2.7* 2.5* 3.2* 3.2* 3.7 3.7 4.2  CL 90* 96*  --  99 98 100 101  CO2 34* 35*  --  37* 36* 32 29  GLUCOSE 106* 92  --  91 87 101* 101*  BUN 18 15  --  9 10 9  9   CREATININE 1.65* 1.19  --  0.77 0.81 0.83 0.85  CALCIUM  8.9 8.4*  --  8.6* 8.9 9.3 9.3  MG 1.7  --  2.2 1.9 1.7  --  1.8  PHOS  --   --   --   --  2.5 3.0  --    Liver Function Tests: Recent Labs  Lab 10/11/24 1252 10/12/24 1845 10/13/24 0438 10/14/24 0534  AST 27 37 26 23  ALT 23 20 15 12   ALKPHOS 145* 113 90 89  BILITOT 0.8 0.5 0.4 0.3  PROT 7.4 6.8 5.6* 5.8*  ALBUMIN 4.0 3.5 2.9* 3.0*   CBG: No results for input(s): GLUCAP in the last 168 hours.  Discharge time spent: 35 minutes.  Signed: Murvin Mana, MD [facility] 10/17/2024

## 2024-10-17 NOTE — Plan of Care (Signed)

## 2024-10-17 NOTE — Plan of Care (Signed)
  Problem: Education: Goal: Knowledge of General Education information will improve Description: Including pain rating scale, medication(s)/side effects and non-pharmacologic comfort measures Outcome: Adequate for Discharge   Problem: Health Behavior/Discharge Planning: Goal: Ability to manage health-related needs will improve Outcome: Adequate for Discharge   Problem: Clinical Measurements: Goal: Ability to maintain clinical measurements within normal limits will improve Outcome: Adequate for Discharge Goal: Will remain free from infection Outcome: Adequate for Discharge Goal: Diagnostic test results will improve Outcome: Adequate for Discharge Goal: Respiratory complications will improve Outcome: Adequate for Discharge Goal: Cardiovascular complication will be avoided Outcome: Adequate for Discharge   Problem: Activity: Goal: Risk for activity intolerance will decrease Outcome: Adequate for Discharge   Problem: Nutrition: Goal: Adequate nutrition will be maintained Outcome: Adequate for Discharge   Problem: Coping: Goal: Level of anxiety will decrease Outcome: Adequate for Discharge   Problem: Elimination: Goal: Will not experience complications related to bowel motility Outcome: Adequate for Discharge Goal: Will not experience complications related to urinary retention Outcome: Adequate for Discharge   Problem: Pain Managment: Goal: General experience of comfort will improve and/or be controlled Outcome: Adequate for Discharge   Problem: Safety: Goal: Ability to remain free from injury will improve Outcome: Adequate for Discharge   Problem: Skin Integrity: Goal: Risk for impaired skin integrity will decrease Outcome: Adequate for Discharge   Problem: Fluid Volume: Goal: Hemodynamic stability will improve Outcome: Adequate for Discharge   Problem: Clinical Measurements: Goal: Diagnostic test results will improve Outcome: Adequate for Discharge Goal: Signs  and symptoms of infection will decrease Outcome: Adequate for Discharge   Problem: Respiratory: Goal: Ability to maintain adequate ventilation will improve Outcome: Adequate for Discharge

## 2024-10-17 NOTE — Progress Notes (Signed)
 Mobility Specialist Progress Note:    10/17/24 1205  Mobility  Activity Stood at bedside;Pivoted/transferred from bed to chair  Level of Assistance Minimal assist, patient does 75% or more  Assistive Device  (HHA)  Distance Ambulated (ft) 2 ft  Range of Motion/Exercises Active;All extremities  RLE Weight Bearing Per Provider Order WBAT  Activity Response Tolerated well  Mobility visit 1 Mobility  Mobility Specialist Start Time (ACUTE ONLY) 1149  Mobility Specialist Stop Time (ACUTE ONLY) 1204  Mobility Specialist Time Calculation (min) (ACUTE ONLY) 15 min   Pt received in bed, agreeable to mobility. Required MinA to stand and pivot with one person hand-held assist. Tolerated well, RLE WBAT with cam boot placed prior to mobility. Alarm on and lunch placed in front of pt. All needs met.  Sherrilee Ditty Mobility Specialist Please contact via Special Educational Needs Teacher or  Rehab office at [phone]

## 2024-10-19 LAB — HOMOCYSTEINE: Homocysteine: 21.2 umol/L — ABNORMAL HIGH (ref 0.0–17.2)

## 2024-10-19 NOTE — TOC PASRR Note (Signed)
 RE: Bradley Sawyer. Hopkin Date of Birth: 15-Apr-1956 Date: 10/19/2024     To Whom It May Concern:   Please be advised that the above-named patient will require a short-term nursing home stay - anticipated 30 days or less for rehabilitation and strengthening.  The plan is for return home

## 2024-12-04 ENCOUNTER — Other Ambulatory Visit: Payer: Self-pay

## 2024-12-08 ENCOUNTER — Other Ambulatory Visit: Payer: Self-pay

## 2024-12-08 ENCOUNTER — Emergency Department

## 2024-12-08 ENCOUNTER — Inpatient Hospital Stay
Admission: EM | Admit: 2024-12-08 | Discharge: 2024-12-10 | DRG: 872 | Disposition: A | Discharge status: Home / Self Care | Attending: Internal Medicine | Admitting: Internal Medicine

## 2024-12-08 DIAGNOSIS — Z7902 Long term (current) use of antithrombotics/antiplatelets: Secondary | ICD-10-CM

## 2024-12-08 DIAGNOSIS — N179 Acute kidney failure, unspecified: Principal | ICD-10-CM | POA: Diagnosis present

## 2024-12-08 DIAGNOSIS — Z1152 Encounter for screening for COVID-19: Secondary | ICD-10-CM

## 2024-12-08 DIAGNOSIS — Z9049 Acquired absence of other specified parts of digestive tract: Secondary | ICD-10-CM

## 2024-12-08 DIAGNOSIS — F411 Generalized anxiety disorder: Secondary | ICD-10-CM | POA: Diagnosis present

## 2024-12-08 DIAGNOSIS — E860 Dehydration: Secondary | ICD-10-CM | POA: Diagnosis present

## 2024-12-08 DIAGNOSIS — Z8249 Family history of ischemic heart disease and other diseases of the circulatory system: Secondary | ICD-10-CM

## 2024-12-08 DIAGNOSIS — K760 Fatty (change of) liver, not elsewhere classified: Secondary | ICD-10-CM | POA: Diagnosis present

## 2024-12-08 DIAGNOSIS — E785 Hyperlipidemia, unspecified: Secondary | ICD-10-CM | POA: Diagnosis present

## 2024-12-08 DIAGNOSIS — Z602 Problems related to living alone: Secondary | ICD-10-CM | POA: Diagnosis present

## 2024-12-08 DIAGNOSIS — E872 Acidosis, unspecified: Secondary | ICD-10-CM | POA: Diagnosis present

## 2024-12-08 DIAGNOSIS — Z8701 Personal history of pneumonia (recurrent): Secondary | ICD-10-CM

## 2024-12-08 DIAGNOSIS — Z9981 Dependence on supplemental oxygen: Secondary | ICD-10-CM

## 2024-12-08 DIAGNOSIS — Z604 Social exclusion and rejection: Secondary | ICD-10-CM | POA: Diagnosis present

## 2024-12-08 DIAGNOSIS — Z7984 Long term (current) use of oral hypoglycemic drugs: Secondary | ICD-10-CM

## 2024-12-08 DIAGNOSIS — G8929 Other chronic pain: Secondary | ICD-10-CM | POA: Diagnosis present

## 2024-12-08 DIAGNOSIS — Z7982 Long term (current) use of aspirin: Secondary | ICD-10-CM

## 2024-12-08 DIAGNOSIS — M109 Gout, unspecified: Secondary | ICD-10-CM | POA: Diagnosis present

## 2024-12-08 DIAGNOSIS — Z885 Allergy status to narcotic agent status: Secondary | ICD-10-CM

## 2024-12-08 DIAGNOSIS — Z95820 Peripheral vascular angioplasty status with implants and grafts: Secondary | ICD-10-CM

## 2024-12-08 DIAGNOSIS — A419 Sepsis, unspecified organism: Principal | ICD-10-CM | POA: Diagnosis present

## 2024-12-08 DIAGNOSIS — J440 Chronic obstructive pulmonary disease with acute lower respiratory infection: Secondary | ICD-10-CM | POA: Diagnosis present

## 2024-12-08 DIAGNOSIS — R0600 Dyspnea, unspecified: Secondary | ICD-10-CM

## 2024-12-08 DIAGNOSIS — Z79899 Other long term (current) drug therapy: Secondary | ICD-10-CM

## 2024-12-08 DIAGNOSIS — Z91030 Bee allergy status: Secondary | ICD-10-CM

## 2024-12-08 DIAGNOSIS — Z981 Arthrodesis status: Secondary | ICD-10-CM

## 2024-12-08 DIAGNOSIS — I10 Essential (primary) hypertension: Secondary | ICD-10-CM | POA: Diagnosis present

## 2024-12-08 DIAGNOSIS — Z833 Family history of diabetes mellitus: Secondary | ICD-10-CM

## 2024-12-08 DIAGNOSIS — Z87891 Personal history of nicotine dependence: Secondary | ICD-10-CM

## 2024-12-08 DIAGNOSIS — Z8673 Personal history of transient ischemic attack (TIA), and cerebral infarction without residual deficits: Secondary | ICD-10-CM

## 2024-12-08 DIAGNOSIS — I959 Hypotension, unspecified: Secondary | ICD-10-CM | POA: Diagnosis present

## 2024-12-08 DIAGNOSIS — Z820 Family history of epilepsy and other diseases of the nervous system: Secondary | ICD-10-CM

## 2024-12-08 DIAGNOSIS — Z9889 Other specified postprocedural states: Secondary | ICD-10-CM

## 2024-12-08 DIAGNOSIS — M1990 Unspecified osteoarthritis, unspecified site: Secondary | ICD-10-CM | POA: Diagnosis present

## 2024-12-08 DIAGNOSIS — I2510 Atherosclerotic heart disease of native coronary artery without angina pectoris: Secondary | ICD-10-CM | POA: Diagnosis present

## 2024-12-08 DIAGNOSIS — K219 Gastro-esophageal reflux disease without esophagitis: Secondary | ICD-10-CM | POA: Diagnosis present

## 2024-12-08 LAB — COMPREHENSIVE METABOLIC PANEL WITH GFR
ALT: 11 U/L (ref 0–44)
AST: 17 U/L (ref 15–41)
Albumin: 4.2 g/dL (ref 3.5–5.0)
Alkaline Phosphatase: 108 U/L (ref 38–126)
Anion gap: 15 (ref 5–15)
BUN: 36 mg/dL — ABNORMAL HIGH (ref 8–23)
CO2: 26 mmol/L (ref 22–32)
Calcium: 9.7 mg/dL (ref 8.9–10.3)
Chloride: 89 mmol/L — ABNORMAL LOW (ref 98–111)
Creatinine, Ser: 3.69 mg/dL — ABNORMAL HIGH (ref 0.61–1.24)
GFR, Estimated: 17 mL/min — ABNORMAL LOW
Glucose, Bld: 112 mg/dL — ABNORMAL HIGH (ref 70–99)
Potassium: 4 mmol/L (ref 3.5–5.1)
Sodium: 130 mmol/L — ABNORMAL LOW (ref 135–145)
Total Bilirubin: 0.3 mg/dL (ref 0.0–1.2)
Total Protein: 7.1 g/dL (ref 6.5–8.1)

## 2024-12-08 LAB — LIPASE, BLOOD: Lipase: 26 U/L (ref 11–51)

## 2024-12-08 LAB — CBC WITH DIFFERENTIAL/PLATELET
Abs Immature Granulocytes: 0.01 K/uL (ref 0.00–0.07)
Basophils Absolute: 0.1 K/uL (ref 0.0–0.1)
Basophils Relative: 1 %
Eosinophils Absolute: 0.1 K/uL (ref 0.0–0.5)
Eosinophils Relative: 2 %
HCT: 32 % — ABNORMAL LOW (ref 39.0–52.0)
Hemoglobin: 10.4 g/dL — ABNORMAL LOW (ref 13.0–17.0)
Immature Granulocytes: 0 %
Lymphocytes Relative: 26 %
Lymphs Abs: 1.9 K/uL (ref 0.7–4.0)
MCH: 27.2 pg (ref 26.0–34.0)
MCHC: 32.5 g/dL (ref 30.0–36.0)
MCV: 83.6 fL (ref 80.0–100.0)
Monocytes Absolute: 0.6 K/uL (ref 0.1–1.0)
Monocytes Relative: 8 %
Neutro Abs: 4.7 K/uL (ref 1.7–7.7)
Neutrophils Relative %: 63 %
Platelets: 196 K/uL (ref 150–400)
RBC: 3.83 MIL/uL — ABNORMAL LOW (ref 4.22–5.81)
RDW: 15.5 % (ref 11.5–15.5)
WBC: 7.4 K/uL (ref 4.0–10.5)
nRBC: 0 % (ref 0.0–0.2)

## 2024-12-08 LAB — RESP PANEL BY RT-PCR (RSV, FLU A&B, COVID)  RVPGX2
Influenza A by PCR: NEGATIVE
Influenza B by PCR: NEGATIVE
Resp Syncytial Virus by PCR: NEGATIVE
SARS Coronavirus 2 by RT PCR: NEGATIVE

## 2024-12-08 LAB — LACTIC ACID, PLASMA
Lactic Acid, Venous: 2.3 mmol/L (ref 0.5–1.9)
Lactic Acid, Venous: 1.4 mmol/L (ref 0.5–1.9)

## 2024-12-08 LAB — D-DIMER, QUANTITATIVE: D-Dimer, Quant: 1.45 ug{FEU}/mL — ABNORMAL HIGH (ref 0.00–0.50)

## 2024-12-08 LAB — MAGNESIUM: Magnesium: 1.7 mg/dL (ref 1.7–2.4)

## 2024-12-08 LAB — CK: Total CK: 55 U/L (ref 49–397)

## 2024-12-08 LAB — TROPONIN T, HIGH SENSITIVITY
Troponin T High Sensitivity: 106 ng/L (ref 0–19)
Troponin T High Sensitivity: 86 ng/L — ABNORMAL HIGH (ref 0–19)

## 2024-12-08 MED ORDER — VANCOMYCIN HCL IN DEXTROSE 1-5 GM/200ML-% IV SOLN
1000.0000 mg | Freq: Once | INTRAVENOUS | Status: AC
  Administered 2024-12-08: 1000 mg via INTRAVENOUS
  Filled 2024-12-08: qty 200

## 2024-12-08 MED ORDER — ONDANSETRON HCL 4 MG/2ML IJ SOLN
4.0000 mg | Freq: Once | INTRAMUSCULAR | Status: AC
  Administered 2024-12-08: 4 mg via INTRAVENOUS
  Filled 2024-12-08: qty 2

## 2024-12-08 MED ORDER — BISACODYL 5 MG PO TBEC: 5.0000 mg | DELAYED_RELEASE_TABLET | Freq: Every day | ORAL | Status: DC | PRN

## 2024-12-08 MED ORDER — ALBUTEROL SULFATE (2.5 MG/3ML) 0.083% IN NEBU: 2.5000 mg | INHALATION_SOLUTION | Freq: Four times a day (QID) | RESPIRATORY_TRACT | Status: DC | PRN

## 2024-12-08 MED ORDER — SODIUM CHLORIDE 0.9 % IV BOLUS
1000.0000 mL | Freq: Once | INTRAVENOUS | Status: AC
  Administered 2024-12-08: 1000 mL via INTRAVENOUS

## 2024-12-08 MED ORDER — ENOXAPARIN SODIUM 30 MG/0.3ML IJ SOSY
30.0000 mg | PREFILLED_SYRINGE | INTRAMUSCULAR | Status: DC
  Administered 2024-12-09: 30 mg via SUBCUTANEOUS
  Filled 2024-12-08: qty 0.3

## 2024-12-08 MED ORDER — ACETAMINOPHEN 500 MG PO TABS
1000.0000 mg | ORAL_TABLET | Freq: Once | ORAL | Status: AC
  Administered 2024-12-08: 1000 mg via ORAL
  Filled 2024-12-08: qty 2

## 2024-12-08 MED ORDER — SODIUM CHLORIDE 0.9 % IV SOLN
2.0000 g | Freq: Once | INTRAVENOUS | Status: AC
  Administered 2024-12-08: 2 g via INTRAVENOUS
  Filled 2024-12-08: qty 12.5

## 2024-12-08 MED ORDER — ACETAMINOPHEN 325 MG PO TABS: 650.0000 mg | ORAL_TABLET | Freq: Four times a day (QID) | ORAL | Status: DC | PRN

## 2024-12-08 MED ORDER — ONDANSETRON HCL 4 MG PO TABS: 4.0000 mg | ORAL_TABLET | Freq: Four times a day (QID) | ORAL | Status: DC | PRN

## 2024-12-08 MED ORDER — HYDROCORTISONE SOD SUC (PF) 100 MG IJ SOLR
100.0000 mg | Freq: Two times a day (BID) | INTRAMUSCULAR | Status: DC
  Administered 2024-12-08 – 2024-12-09 (×2): 100 mg via INTRAVENOUS
  Filled 2024-12-08 (×3): qty 2

## 2024-12-08 MED ORDER — METRONIDAZOLE 500 MG/100ML IV SOLN
500.0000 mg | Freq: Once | INTRAVENOUS | Status: AC
  Administered 2024-12-08: 500 mg via INTRAVENOUS
  Filled 2024-12-08: qty 100

## 2024-12-08 MED ORDER — OXYCODONE HCL 5 MG PO TABS
5.0000 mg | ORAL_TABLET | ORAL | Status: DC | PRN
  Administered 2024-12-09: 5 mg via ORAL
  Filled 2024-12-08 (×2): qty 1

## 2024-12-08 MED ORDER — LACTATED RINGERS IV SOLN: INTRAVENOUS | Status: AC

## 2024-12-08 MED ORDER — OXYCODONE HCL 5 MG PO TABS
15.0000 mg | ORAL_TABLET | Freq: Once | ORAL | Status: AC
  Administered 2024-12-08: 15 mg via ORAL
  Filled 2024-12-08: qty 3

## 2024-12-08 MED ORDER — METRONIDAZOLE 500 MG/100ML IV SOLN
500.0000 mg | Freq: Two times a day (BID) | INTRAVENOUS | Status: DC
  Administered 2024-12-09: 500 mg via INTRAVENOUS
  Filled 2024-12-08 (×2): qty 100

## 2024-12-08 MED ORDER — LACTATED RINGERS IV SOLN
150.0000 mL/h | INTRAVENOUS | Status: AC
  Administered 2024-12-08 – 2024-12-09 (×2): 150 mL/h via INTRAVENOUS

## 2024-12-08 MED ORDER — ACETAMINOPHEN 650 MG RE SUPP: 650.0000 mg | Freq: Four times a day (QID) | RECTAL | Status: DC | PRN

## 2024-12-08 MED ORDER — LACTATED RINGERS IV BOLUS (SEPSIS)
1000.0000 mL | Freq: Once | INTRAVENOUS | Status: AC
  Administered 2024-12-08: 1000 mL via INTRAVENOUS

## 2024-12-08 MED ORDER — SODIUM CHLORIDE 0.9 % IV SOLN
2000.0000 mg | INTRAVENOUS | Status: DC
  Filled 2024-12-08: qty 12.5

## 2024-12-08 MED ORDER — POLYETHYLENE GLYCOL 3350 17 G PO PACK: 17.0000 g | PACK | Freq: Every day | ORAL | Status: DC | PRN

## 2024-12-08 MED ORDER — ONDANSETRON HCL 4 MG/2ML IJ SOLN: 4.0000 mg | Freq: Four times a day (QID) | INTRAMUSCULAR | Status: DC | PRN

## 2024-12-08 MED ORDER — VANCOMYCIN VARIABLE DOSE PER UNSTABLE RENAL FUNCTION (PHARMACIST DOSING): Status: DC

## 2024-12-08 MED ORDER — VANCOMYCIN HCL 750 MG/150ML IV SOLN
750.0000 mg | Freq: Once | INTRAVENOUS | Status: AC
  Administered 2024-12-08: 750 mg via INTRAVENOUS
  Filled 2024-12-08: qty 150

## 2024-12-08 NOTE — ED Triage Notes (Signed)
 C/O SOB x 1 day.  Family states appetite is down since starting Colchicine  (right foot gout)  Wears 4l/  [place] home oxygen  AAOx3. Skin warm and dry. Patient is pale. No SOB/ DOE at this time.

## 2024-12-08 NOTE — Assessment & Plan Note (Signed)
 Sepsis - likely 2/2 pulmonary infection, but possibly uti.  Urine and blood cx ordered.  Vanc, cefepime , flagyl  started - renally dosed.  On 4L [place] at home - no increased oxygen demand in the ED.  No respiratory distress, but low pitched wheezing appreciated on exhalation.  - on telemetry - continue iv abx - transition to po as able.  - continuous pulse ox - chronically on 4L.   - continue mIVF for hypotension/aki

## 2024-12-08 NOTE — Progress Notes (Signed)
 Pharmacy Antibiotic Note  Bradley Sawyer is a 69 y.o. male admitted on 12/08/2024 with sepsis.  Pharmacy has been consulted for vancomycin  dosing. Patient is also ordered cefepime  and metronidazole .   Plan: Vancomycin  1000 mg iv once given. Will give an additional 750 mg for a total load of 1750 mg. Will f/u renal function for repeat dosing in the setting of AKI.  Weight: 84.7 kg (186 lb 11.7 oz)  Temp (24hrs), Avg:98 F (36.7 C), Min:97.8 F (36.6 C), Max:98.1 F (36.7 C)  Recent Labs  Lab 12/08/24 1541 12/08/24 1914  WBC 7.4  --   CREATININE 3.69*  --   LATICACIDVEN 2.3* 1.4    Estimated Creatinine Clearance: 20.4 mL/min (A) (by C-G formula based on SCr of 3.69 mg/dL (H)).    Allergies[1]  Thank you for allowing pharmacy to be a part of this patients care.  Bari Glendia BIRCH 12/08/2024 8:58 PM     [1]  Allergies Allergen Reactions   Bee Venom Swelling   Tapentadol Shortness Of Breath    Other Reaction(s): sob/tachycardia

## 2024-12-08 NOTE — ED Provider Notes (Signed)
 "  [facility] Provider Note    Event Date/Time   First MD Initiated Contact with Patient 12/08/24 1512     (approximate)   History   Shortness of Breath   HPI  Bradley Sawyer is a 69 y.o. male past medical history significant for hypertension, GERD, COPD, history of CVA, CAD hyperlipidemia, anxiety disorder, anemia, gout, who presents to the emergency department for not feeling well with nausea and poor appetite.  States that he started feeling poorly yesterday.  Was complaining of nausea, diarrhea, upset stomach and poor appetite.  Started having some worsening shortness of breath.  Denies any fever or chills.  Denies any episodes of vomiting.  Denies any melanotic stool.  No active abdominal pain at this time.  Denies fever or chills.  No dysuria, urinary urgency or frequency.  No history of DVT or PE.  Not on anticoagulation.  States that he normally has an elevated blood pressure and has been taking his hypertensive medications as prescribed.  On chart review patient had a recent hospitalization and was discharged on 10/17/2024 following hospitalization where he is found to have hypotension and sepsis secondary to pneumonia.     Physical Exam   Triage Vital Signs: ED Triage Vitals  Encounter Vitals Group     BP 12/08/24 1446 118/89     Girls Systolic BP Percentile --      Girls Diastolic BP Percentile --      Boys Systolic BP Percentile --      Boys Diastolic BP Percentile --      Pulse Rate 12/08/24 1446 (!) 112     Resp 12/08/24 1446 20     Temp 12/08/24 1446 97.8 F (36.6 C)     Temp Source 12/08/24 1446 Oral     SpO2 12/08/24 1446 98 %     Weight 12/08/24 1445 186 lb 11.7 oz (84.7 kg)     Height --      Head Circumference --      Peak Flow --      Pain Score 12/08/24 1444 8     Pain Loc --      Pain Education --      Exclude from Growth Chart --     Most recent vital signs: Vitals:   12/08/24 1615 12/08/24 1630  BP: (!) 74/46 (!)  77/54  Pulse: 60 (!) 56  Resp: 14 13  Temp:    SpO2: 100% 100%    Physical Exam Constitutional:      Appearance: He is well-developed.  HENT:     Head: Atraumatic.  Eyes:     Conjunctiva/sclera: Conjunctivae normal.  Cardiovascular:     Rate and Rhythm: Regular rhythm.  Pulmonary:     Effort: No tachypnea or respiratory distress.     Breath sounds: No wheezing.     Comments: 4 L of home oxygen Abdominal:     Palpations: Abdomen is soft.     Tenderness: There is abdominal tenderness (Mild epigastric abdominal tenderness to palpation.  No rebound or guarding.  No lower abdominal tenderness.).  Musculoskeletal:     Cervical back: Normal range of motion.     Right lower leg: No edema.     Left lower leg: No edema.  Skin:    General: Skin is warm.     Capillary Refill: Capillary refill takes 2 to 3 seconds.  Neurological:     General: No focal deficit present.     Mental Status:  He is alert. Mental status is at baseline.     IMPRESSION / MDM / ASSESSMENT AND PLAN / ED COURSE  I reviewed the triage vital signs and the nursing notes.  On arrival patient was afebrile, initially was tachycardic with normotension and was on his 4 L of home oxygen.  Repeat blood pressure in the emergency department with significant hypotension with a blood pressure of 70/43, repeat was 62/44.  Differential diagnosis ACS, dysrhythmia, pulmonary embolism, sepsis, urinary tract infection, dehydration, viral illness including COVID/influenza, pneumonia, anemia  Blood cultures obtained.  Felt that 30 cc/kg of IV fluids may be detrimental to the patient given his shortness of breath, started on 1 L of normal saline.  Will reevaluate after 1 L to see if he needs further IV fluids.   EKG  I, Clotilda Punter, the attending physician, personally viewed and interpreted this ECG.  EKG showed normal sinus rhythm.  Normal intervals.  No chamber enlargement.  No significant ST elevation or depression.  No  findings of acute ischemia or dysrhythmia.  Questional a mildly elevated ST elevation to lead III but no reciprocal change and no ST elevation to lead III or aVF.  No significant change when compared to prior EKG.  No tachycardic or bradycardic dysrhythmias while on cardiac telemetry.  RADIOLOGY I independently reviewed imaging, my interpretation of imaging: Chest x-ray -chest x-ray with no findings of pneumothorax.  Normal mediastinum.  No obvious focal findings of pneumonia.  LABS (all labs ordered are listed, but only abnormal results are displayed) Labs interpreted as -    Labs Reviewed  COMPREHENSIVE METABOLIC PANEL WITH GFR - Abnormal; Notable for the following components:      Result Value   Sodium 130 (*)    Chloride 89 (*)    Glucose, Bld 112 (*)    BUN 36 (*)    Creatinine, Ser 3.69 (*)    GFR, Estimated 17 (*)    All other components within normal limits  CBC WITH DIFFERENTIAL/PLATELET - Abnormal; Notable for the following components:   RBC 3.83 (*)    Hemoglobin 10.4 (*)    HCT 32.0 (*)    All other components within normal limits  LACTIC ACID, PLASMA - Abnormal; Notable for the following components:   Lactic Acid, Venous 2.3 (*)    All other components within normal limits  TROPONIN T, HIGH SENSITIVITY - Abnormal; Notable for the following components:   Troponin T High Sensitivity 106 (*)    All other components within normal limits  TROPONIN T, HIGH SENSITIVITY - Abnormal; Notable for the following components:   Troponin T High Sensitivity 86 (*)    All other components within normal limits  RESP PANEL BY RT-PCR (RSV, FLU A&B, COVID)  RVPGX2  CULTURE, BLOOD (ROUTINE X 2)  CULTURE, BLOOD (ROUTINE X 2)  MAGNESIUM   LIPASE, BLOOD  CK  LACTIC ACID, PLASMA  URINALYSIS, W/ REFLEX TO CULTURE (INFECTION SUSPECTED)     MDM   Clinical Course as of 12/08/24 1842  Sat Dec 08, 2024  1618 Blood pressure continues to be low with a MAP of 56 but improvement slightly  of his blood pressure of 74/46.  Give another 1 L bolus of normal saline [SM]  1708 Significant acute kidney injury, unable to get contrasted CT scan.  Chest x-ray with no signs of pneumonia, no signs of cardiomegaly or pulmonary edema.  Continues to have significantly low blood pressure and is currently getting a second liter of IV fluids,  is improving at this time.  Denies any chest pain, denies any tearing pain in his back.  Have a low suspicion for dissection.  Attempted bedside echocardiogram but have very poor windows, likely in the setting of COPD, I do not see any significant pericardial effusion but very limited study.  Initial troponin is elevated at 106.  Plan to get a second troponin.  Repeat EKG with no STEMI criteria.  No active chest pain at this time. [SM]  1739 Repeat troponin is downtrending at 86, no active chest pain at this time several lower suspicion for cardiogenic shock.  No significant leukocytosis but does have an elevated lactic acid of 2.3.  CT scan without contrast resulted with questionable pneumonia.  Sepsis order set utilized.  Patient has already had blood cultures obtained.  Receiving 30 cc/kg of IV fluids with a total of 3 L of IV fluids.  Started on antibiotics to cover unknown source.  Has not yet provided a urine to obtain a urinalysis. [SM]  1842 Blood pressure now significantly improved and is 110/95.  Attempting to get a urine at this time. [SM]    Clinical Course User Index [SM] Suzanne Kirsch, MD     PROCEDURES:  Critical Care performed: yes  .Critical Care  Performed by: Suzanne Kirsch, MD Authorized by: Suzanne Kirsch, MD   Critical care provider statement:    Critical care time (minutes):  60   Critical care time was exclusive of:  Separately billable procedures and treating other patients   Critical care was necessary to treat or prevent imminent or life-threatening deterioration of the following conditions: hypotension.   Critical care was time  spent personally by me on the following activities:  Development of treatment plan with patient or surrogate, discussions with consultants, evaluation of patient's response to treatment, examination of patient, ordering and review of laboratory studies, ordering and review of radiographic studies, ordering and performing treatments and interventions, pulse oximetry, re-evaluation of patient's condition and review of old charts   Care discussed with: admitting provider     Patient's presentation is most consistent with acute presentation with potential threat to life or bodily function.   MEDICATIONS ORDERED IN ED: Medications  acetaminophen  (TYLENOL ) tablet 1,000 mg (has no administration in time range)  lactated ringers  infusion (has no administration in time range)  lactated ringers  bolus 1,000 mL (has no administration in time range)  ceFEPIme  (MAXIPIME ) 2 g in sodium chloride  0.9 % 100 mL IVPB (has no administration in time range)  metroNIDAZOLE  (FLAGYL ) IVPB 500 mg (has no administration in time range)  vancomycin  (VANCOCIN ) IVPB 1000 mg/200 mL premix (has no administration in time range)  sodium chloride  0.9 % bolus 1,000 mL (1,000 mLs Intravenous New Bag/Given 12/08/24 1533)  ondansetron  (ZOFRAN ) injection 4 mg (4 mg Intravenous Given 12/08/24 1556)  sodium chloride  0.9 % bolus 1,000 mL (1,000 mLs Intravenous New Bag/Given 12/08/24 1623)    FINAL CLINICAL IMPRESSION(S) / ED DIAGNOSES   Final diagnoses:  None     Rx / DC Orders   ED Discharge Orders     None        Note:  This document was prepared using Dragon voice recognition software and may include unintentional dictation errors.   Suzanne Kirsch, MD 12/08/24 1742  "

## 2024-12-08 NOTE — Progress Notes (Signed)
 CODE SEPSIS - PHARMACY COMMUNICATION  **Broad Spectrum Antibiotics should be administered within 1 hour of Sepsis diagnosis**  Time Code Sepsis Called/Page Received: 1739  Antibiotics Ordered: cefepime /metronidazole /vancomycin   Time of 1st antibiotic administration: 1802  Additional action taken by pharmacy:   If necessary, Name of Provider/Nurse Contacted:     Bari Glendia BIRCH ,PharmD Clinical Pharmacist  12/08/2024  5:42 PM

## 2024-12-08 NOTE — H&P (Signed)
 " History and Physical    Patient: Bradley Sawyer FMW:[id number] DOB: 08-02-1956 DOA: 12/08/2024 DOS: the patient was seen and examined on 12/09/2024 PCP: Corrington, Coye LABOR, MD  Patient coming from: Home  Chief Complaint:  Chief Complaint  Patient presents with   Shortness of Breath   HPI: Bradley Sawyer is a 69 y.o. male with medical history significant of   Patient is a male with a past medical history of COPD, GERD, CVA, CAD, HLD, GAD, gout, anemia who presents to the ED with shortness of breath and nausea.  Reports worsening shortness of breath over the last couple of days. Also reports nausea and poor oral intake. Denies fevers. Reports new onset left knee pain that he attributes to gout. Denies diarrhea. Reports no difficulty urinating earlier today at home, but does not currently feel the urge to urinate. Denies pain with urination.   Review of Systems: As mentioned in the history of present illness. All other systems reviewed and are negative. 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   Compression fracture    L2   COPD (chronic obstructive pulmonary disease) (HCC)    bronchitis   CVA (cerebral infarction)    Disorder of vocal cord    Dyspnea    Fatty liver    GERD (gastroesophageal reflux disease)    Hx of cardiovascular stress test    ETT/Lexiscan -Myoview (10/15):  Inferior, inferoapical, apical lateral defect c/w mild ischemia, EF 66%; Intermediate Risk   Hyperlipidemia    Hypertension    Right shoulder pain    2/2 partial rotator cuff tear, tendinopathy, mild subacromial/subdeltioid bursitis, A/C joint arthropathy per MRI done in 3/07   Stroke (HCC) 2014   Tobacco abuse    Past Surgical History:  Procedure Laterality Date   ANTERIOR LAT LUMBAR FUSION N/A 12/18/2013   Procedure: Lumbar Two Anteriorlateral  Corpectomy w/ cage, interbody fusion, anterior plating, Lumbar One to Lumbar Three percutaneous pedicle screws;  Surgeon: Victory LABOR Gunnels, MD;  Location: [facility]URO ORS;  Service: Neurosurgery;  Laterality: N/A;   AORTA -INNOMIATE BYPASS N/A 10/15/2014   Procedure: ENDARTERCTOMY OF  INNOMINATE ARTERY ;  Surgeon: Carlin FORBES Haddock, MD;  Location: [facility] OR;  Service: Vascular;  Laterality: N/A;   ARCH AORTOGRAM N/A 08/23/2014   Procedure: ARCH AORTOGRAM;  Surgeon: Carlin FORBES Haddock, MD;  Location: [facility] CATH LAB;  Service: Cardiovascular;  Laterality: N/A;   arch aortogram, left carotid,left subclavian angiogram  08/23/2014   BIOPSY  09/14/2023   Procedure: BIOPSY;  Surgeon: Jinny Carmine, MD;  Location: [facility] ENDOSCOPY;  Service: Endoscopy;;   CAROTID ANGIOGRAM Left 08/23/2014   Procedure: CAROTID GERALYN;  Surgeon: Carlin FORBES Haddock, MD;  Location: [facility] CATH LAB;  Service: Cardiovascular;  Laterality: Left;   CHOLECYSTECTOMY  12/2016   COLONOSCOPY WITH PROPOFOL  N/A 09/14/2023   Procedure: COLONOSCOPY WITH PROPOFOL ;  Surgeon: Jinny Carmine, MD;  Location: [facility] ENDOSCOPY;  Service: Endoscopy;  Laterality: N/A;   ENDARTERECTOMY Right 10/15/2014   Procedure: ENDARTERECTOMY RIGHT SUBCLAVIAN ARTERY;  Surgeon: Carlin FORBES Haddock, MD;  Location: [facility] OR;  Service: Vascular;  Laterality: Right;   ENDARTERECTOMY Right 10/15/2014   Procedure: ENDARTERECTOMY RIGHT COMMON CAROTID ARTERY;  Surgeon: Carlin FORBES Haddock, MD;  Location: [facility] OR;  Service: Vascular;  Laterality: Right;   ESOPHAGOGASTRODUODENOSCOPY (EGD) WITH PROPOFOL  N/A 09/14/2023  Procedure: ESOPHAGOGASTRODUODENOSCOPY (EGD) WITH PROPOFOL ;  Surgeon: Jinny Carmine, MD;  Location: 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   HERNIA REPAIR     LEFT HEART CATH AND CORONARY ANGIOGRAPHY N/A 03/17/2021   Procedure: LEFT HEART CATH AND CORONARY ANGIOGRAPHY;  Surgeon: Fernand Denyse LABOR, MD;  Location: [facility] INVASIVE CV LAB;  Service: Cardiovascular;  Laterality: N/A;   LEFT HEART CATHETERIZATION WITH CORONARY ANGIOGRAM N/A 09/12/2014   Procedure: LEFT HEART CATHETERIZATION WITH CORONARY ANGIOGRAM;  Surgeon: Maude JAYSON Emmer, MD;  Location: [facility] CATH LAB;  Service: Cardiovascular;  Laterality: N/A;   LUMBAR SPINE SURGERY  12/18/2013   L2     DR POOL   NECK SURGERY     PATCH ANGIOPLASTY Right 10/15/2014   Procedure: PATCH ANGIOPLASTY OF RIGHT SUBCLAVIAN ARTERY , RIGHT COMMON CAROTID ARTERY & INNOMINATE ARTERY;  Surgeon: Carlin FORBES Haddock, MD;  Location: [facility] OR;  Service: Vascular;  Laterality: Right;   STERNOTOMY N/A 10/15/2014   Procedure: PARTIAL STERNOTOMY & PLATING OF STERNUM;  Surgeon: Carlin FORBES Haddock, MD;  Location: [facility] OR;  Service: Vascular;  Laterality: N/A;   TEE WITHOUT CARDIOVERSION N/A 12/25/2013   Procedure: TRANSESOPHAGEAL ECHOCARDIOGRAM (TEE);  Surgeon: Ezra GORMAN Shuck, MD;  Location: [facility] ENDOSCOPY;  Service: Cardiovascular;  Laterality: N/A;   Social History:  reports that he quit smoking about 4 years ago. His smoking use included cigarettes. He started smoking about 52 years ago. He has a 96 pack-year smoking history. He has never used smokeless tobacco. He reports that he does not drink alcohol and does not use drugs.  Allergies[1]  Family History  Problem Relation Age of Onset   Heart disease Father    Heart attack Father    Diabetes Paternal Grandmother        1 st degree relatives   Hypertension Paternal Grandmother    Diabetes Brother    Heart disease Brother    Muscular dystrophy Brother    Muscular dystrophy Sister    Prostate cancer Neg Hx    Kidney cancer Neg Hx    Bladder Cancer Neg Hx     Prior to Admission medications  Medication Sig Start Date End Date  Taking? Authorizing Provider  albuterol  (VENTOLIN  HFA) 108 (90 Base) MCG/ACT inhaler INHALE 2 PUFFS BY MOUTH EVERY 4-6 HOURS AS NEEDED FOR WHEEZING 02/25/23   Tamea Dedra CROME, MD  amLODipine  (NORVASC ) 10 MG tablet Take 1 tablet (10 mg total) by mouth daily. 10/17/24 10/17/25  Laurita Pillion, MD  aspirin  EC 81 MG tablet Take 81 mg by mouth daily.    [provider]  atorvastatin  (LIPITOR ) 80 MG tablet TAKE ONE TABLET BY MOUTH ONCE DAILY AT BEDTIME 01/03/24   Fernand Denyse LABOR, MD  BREZTRI  AEROSPHERE 160-9-4.8 MCG/ACT AERO inhaler INHALE TWO PUFFS into THE lungs TWICE DAILY morning & bedtime 06/01/24   Tamea Dedra CROME, MD  clopidogrel  (PLAVIX ) 75 MG tablet TAKE ONE TABLET BY MOUTH ONCE DAILY 01/03/24   Fernand Denyse LABOR, MD  colchicine  0.6 MG tablet Take 1 tablet (0.6 mg total) by mouth daily. 09/14/24   Janit Thresa HERO, DPM  cyanocobalamin  1000 MCG tablet Take 1 tablet (1,000 mcg total) by mouth daily. 10/18/24   Laurita Pillion, MD  cyclobenzaprine  (FLEXERIL ) 10 MG tablet Take  10 mg by mouth 2 (two) times daily as needed. 09/03/24   [provider]  EPINEPHrine  0.3 mg/0.3 mL IJ SOAJ injection Inject 0.3 mg into the muscle as needed for anaphylaxis. 04/04/20   [provider]  famotidine  (PEPCID ) 40 MG tablet TAKE ONE TABLET BY MOUTH ONCE DAILY 01/03/24   Fernand Denyse LABOR, MD  ferrous sulfate  325 (65 FE) MG tablet Take 1 tablet (325 mg total) by mouth daily with breakfast. 10/18/24   Laurita Pillion, MD  FLUoxetine  (PROZAC ) 40 MG capsule Take 40 mg by mouth daily. 09/15/23   [provider]  ipratropium-albuterol  (DUONEB) 0.5-2.5 (3) MG/3ML SOLN Take 3 mLs by nebulization every 6 (six) hours as needed. 03/01/24   Tamea Dedra CROME, MD  isosorbide  mononitrate (IMDUR ) 30 MG 24 hr tablet Take 30 mg by mouth daily.    [provider]  levocetirizine (XYZAL ) 5 MG tablet Take 5 mg by mouth at bedtime. 07/15/20   [provider]  linaclotide  (LINZESS ) 290 MCG CAPS  capsule Take 290 mcg by mouth daily before breakfast. 12/05/19   [provider]  lisinopril -hydrochlorothiazide  (ZESTORETIC ) 20-25 MG tablet Take 1 tablet by mouth daily.    [provider]  metFORMIN  (GLUCOPHAGE ) 500 MG tablet Take 500 mg by mouth daily. 07/22/20   [provider]  montelukast  (SINGULAIR ) 10 MG tablet Take 10 mg by mouth daily. 07/15/20   [provider]  Multiple Vitamin (MULTI-VITAMINS) TABS Take 1 tablet by mouth daily.    [provider]  naloxone  (NARCAN ) nasal spray 4 mg/0.1 mL Place 0.4 mg into the nose once. 09/02/16   [provider]  nitroGLYCERIN  (NITROSTAT ) 0.4 MG SL tablet DISSOLVE (1) TABLET UNDER TONGUE AS NEEDED TO RELIEVE CHEST PAIN. MAY REPEAT EVERY 5 MINUTES. 05/06/23   Fernand Denyse LABOR, MD  oxyCODONE  (ROXICODONE ) 15 MG immediate release tablet Take 1 tablet (15 mg total) by mouth every 4 (four) hours. 10/17/24   Laurita Pillion, MD  OXYGEN Inhale 3 L into the lungs.    [provider]  pantoprazole  (PROTONIX ) 40 MG tablet Take 1 tablet (40 mg total) by mouth 2 (two) times daily before a meal. LAST REFILL MUST SCHEDULE VISIT 09/18/24   Jinny Carmine, MD  polyethylene glycol (MIRALAX  / GLYCOLAX ) packet Take 17 g by mouth daily as needed (for constipation).    [provider]  potassium chloride  SA (KLOR-CON  M) 20 MEQ tablet Take 1 tablet (20 mEq total) by mouth 2 (two) times daily. 10/11/24   Fisher, Devere ORN, PA-C  rOPINIRole  (REQUIP ) 1 MG tablet Take 1 tablet by mouth at bedtime. 06/01/21   [provider]  spironolactone  (ALDACTONE ) 25 MG tablet TAKE ONE TABLET BY MOUTH ONCE DAILY 01/03/24   Fernand Denyse LABOR, MD  tamsulosin  (FLOMAX ) 0.4 MG CAPS capsule TAKE ONE CAPSULE BY MOUTH EVERY DAY 03/07/23   Helon Clotilda LABOR, PA-C    Physical Exam: Vitals:   12/08/24 1730 12/08/24 1845 12/08/24 1848 12/09/24 0122  BP: (!) 88/61 96/74  (!) 80/54  Pulse: (!) 52 62  66  Resp: 18 13  14   Temp:   98.1  F (36.7 C) 98.3 F (36.8 C)  TempSrc:   Oral Oral  SpO2: 100% 100%  100%  Weight:       Gen: alert, oriented.   Heent: perrla, eomi,  Cv: rrr, no murmur Pulm: low pitched wheeze b/l.  On 4L Cassia. No resp distress.  Gi: soft, mild ttp in RUQ. Nbs Ext: no  pedal edema Neuro: cn 2-12 grossly intact Msk: strength equal b/l.  Sarcopenic.   Psych: pleasant affect.     Assessment and Plan: No notes have been filed under this hospital service. Service: Hospitalist    Severe Sepsis -  likely 2/2 pulmonary infection, but possibly uti.  Urine and blood cx ordered.  Vanc, cefepime , flagyl  started - renally dosed.  On 4L  at home - no increased oxygen demand in the ED.  No respiratory distress, but low pitched wheezing appreciated on exhalation.  - on telemetry - continue iv abx - transition to po as able. - On IV steroid (Solu-Cortef  twice daily) - continuous pulse ox - chronically on 4L.   - continue mIVF for hypotension/aki  AKI Significant increase in Creatinine over baseline. D/t poor intake/vomiting. Given 30ml/kg bolus in ED. - Continue mIVF at 150ml/hr. - Recheck cmp in am. - Continue to renally dose medications. - Holding home antihypertensives.  Chronic stable conditions - Patient does not know what medications he takes. - Hold off on restarting any home medications until med rec can be completed.    Advance Care Planning:   Code Status: Full Code   Consults: no  Family Communication: point of contact - son, Lynwood Gallus  Severity of Illness: The appropriate patient status for this patient is INPATIENT. Inpatient status is judged to be reasonable and necessary in order to provide the required intensity of service to ensure the patient's safety. The patient's presenting symptoms, physical exam findings, and initial radiographic and laboratory data in the context of their chronic comorbidities is felt to place them at high risk for further clinical deterioration.  Furthermore, it is not anticipated that the patient will be medically stable for discharge from the hospital within 2 midnights of admission.   * I certify that at the point of admission it is my clinical judgment that the patient will require inpatient hospital care spanning beyond 2 midnights from the point of admission due to high intensity of service, high risk for further deterioration and high frequency of surveillance required.*  Author: Toribio MARLA Slain, MD 12/09/2024 1:44 AM  For on call review [email or web address].      [1]  Allergies Allergen Reactions   Bee Venom Swelling   Tapentadol Shortness Of Breath    Other Reaction(s): sob/tachycardia   "

## 2024-12-08 NOTE — ED Notes (Signed)
 Fall bundle is in place. Pt has fall bracelet on, call bell by bedside, given hospital socks, and informed pt to let nursing staff when needing to get up from bed so we can assist him. Pt understood at this time.

## 2024-12-08 NOTE — Sepsis Progress Note (Signed)
 B Oostuzem RN was notified/inquired about timing of BC and Abx's, per RN he had no information about this and day shift RN gone home

## 2024-12-08 NOTE — Assessment & Plan Note (Signed)
 Significant increase in Creatinine over baseline.  Given 30ml/kg bolus in ED.  Continue mIVF at 150ml/hr.  Recheck cmp in am.  Continue to renally dose medications.  Holding antihypertensives.

## 2024-12-09 ENCOUNTER — Encounter: Payer: Self-pay | Admitting: Family Medicine

## 2024-12-09 ENCOUNTER — Other Ambulatory Visit: Payer: Self-pay | Admitting: Gastroenterology

## 2024-12-09 DIAGNOSIS — N179 Acute kidney failure, unspecified: Secondary | ICD-10-CM

## 2024-12-09 DIAGNOSIS — R6520 Severe sepsis without septic shock: Secondary | ICD-10-CM | POA: Diagnosis not present

## 2024-12-09 DIAGNOSIS — E860 Dehydration: Secondary | ICD-10-CM | POA: Diagnosis not present

## 2024-12-09 DIAGNOSIS — Z8673 Personal history of transient ischemic attack (TIA), and cerebral infarction without residual deficits: Secondary | ICD-10-CM | POA: Diagnosis not present

## 2024-12-09 DIAGNOSIS — R0600 Dyspnea, unspecified: Secondary | ICD-10-CM | POA: Diagnosis not present

## 2024-12-09 DIAGNOSIS — I959 Hypotension, unspecified: Secondary | ICD-10-CM | POA: Diagnosis not present

## 2024-12-09 DIAGNOSIS — A419 Sepsis, unspecified organism: Secondary | ICD-10-CM | POA: Diagnosis not present

## 2024-12-09 DIAGNOSIS — I2510 Atherosclerotic heart disease of native coronary artery without angina pectoris: Secondary | ICD-10-CM | POA: Diagnosis not present

## 2024-12-09 LAB — COMPREHENSIVE METABOLIC PANEL WITH GFR
ALT: 9 U/L (ref 0–44)
AST: 19 U/L (ref 15–41)
Albumin: 3.6 g/dL (ref 3.5–5.0)
Alkaline Phosphatase: 92 U/L (ref 38–126)
Anion gap: 9 (ref 5–15)
BUN: 29 mg/dL — ABNORMAL HIGH (ref 8–23)
CO2: 25 mmol/L (ref 22–32)
Calcium: 9.4 mg/dL (ref 8.9–10.3)
Chloride: 98 mmol/L (ref 98–111)
Creatinine, Ser: 2.22 mg/dL — ABNORMAL HIGH (ref 0.61–1.24)
GFR, Estimated: 31 mL/min — ABNORMAL LOW
Glucose, Bld: 110 mg/dL — ABNORMAL HIGH (ref 70–99)
Potassium: 4.4 mmol/L (ref 3.5–5.1)
Sodium: 132 mmol/L — ABNORMAL LOW (ref 135–145)
Total Bilirubin: 0.3 mg/dL (ref 0.0–1.2)
Total Protein: 6.3 g/dL — ABNORMAL LOW (ref 6.5–8.1)

## 2024-12-09 LAB — GLUCOSE, CAPILLARY
Glucose-Capillary: 123 mg/dL — ABNORMAL HIGH (ref 70–99)
Glucose-Capillary: 159 mg/dL — ABNORMAL HIGH (ref 70–99)

## 2024-12-09 LAB — CBC
HCT: 25.7 % — ABNORMAL LOW (ref 39.0–52.0)
Hemoglobin: 8.7 g/dL — ABNORMAL LOW (ref 13.0–17.0)
MCH: 27.8 pg (ref 26.0–34.0)
MCHC: 33.9 g/dL (ref 30.0–36.0)
MCV: 82.1 fL (ref 80.0–100.0)
Platelets: 168 K/uL (ref 150–400)
RBC: 3.13 MIL/uL — ABNORMAL LOW (ref 4.22–5.81)
RDW: 15.3 % (ref 11.5–15.5)
WBC: 6.1 K/uL (ref 4.0–10.5)
nRBC: 0 % (ref 0.0–0.2)

## 2024-12-09 LAB — URINALYSIS, W/ REFLEX TO CULTURE (INFECTION SUSPECTED)
Bilirubin Urine: NEGATIVE
Glucose, UA: NEGATIVE mg/dL
Hgb urine dipstick: NEGATIVE
Ketones, ur: NEGATIVE mg/dL
Leukocytes,Ua: NEGATIVE
Nitrite: NEGATIVE
Protein, ur: NEGATIVE mg/dL
Specific Gravity, Urine: 1.008 (ref 1.005–1.030)
pH: 5 (ref 5.0–8.0)

## 2024-12-09 LAB — PROTIME-INR
INR: 1.1 (ref 0.8–1.2)
Prothrombin Time: 14.5 s (ref 11.4–15.2)

## 2024-12-09 LAB — VANCOMYCIN, RANDOM: Vancomycin Rm: 9 ug/mL

## 2024-12-09 LAB — MRSA NEXT GEN BY PCR, NASAL: MRSA by PCR Next Gen: NOT DETECTED

## 2024-12-09 MED ORDER — AMOXICILLIN-POT CLAVULANATE 875-125 MG PO TABS
1.0000 | ORAL_TABLET | Freq: Two times a day (BID) | ORAL | Status: DC
  Administered 2024-12-09: 1 via ORAL
  Filled 2024-12-09: qty 1

## 2024-12-09 MED ORDER — ENOXAPARIN SODIUM 40 MG/0.4ML IJ SOSY: 40.0000 mg | PREFILLED_SYRINGE | INTRAMUSCULAR | Status: DC

## 2024-12-09 MED ORDER — SODIUM CHLORIDE 0.9 % IV SOLN: INTRAVENOUS | Status: DC

## 2024-12-09 MED ORDER — ALBUTEROL SULFATE (2.5 MG/3ML) 0.083% IN NEBU: 3.0000 mL | INHALATION_SOLUTION | RESPIRATORY_TRACT | Status: DC | PRN

## 2024-12-09 MED ORDER — PANTOPRAZOLE SODIUM 40 MG PO TBEC
40.0000 mg | DELAYED_RELEASE_TABLET | Freq: Two times a day (BID) | ORAL | Status: DC
  Administered 2024-12-09 – 2024-12-10 (×3): 40 mg via ORAL
  Filled 2024-12-09 (×3): qty 1

## 2024-12-09 MED ORDER — SODIUM CHLORIDE 0.9 % IV SOLN
2000.0000 mg | Freq: Two times a day (BID) | INTRAVENOUS | Status: DC
  Administered 2024-12-09: 2000 mg via INTRAVENOUS
  Filled 2024-12-09 (×2): qty 12.5

## 2024-12-09 MED ORDER — CLOPIDOGREL BISULFATE 75 MG PO TABS
75.0000 mg | ORAL_TABLET | Freq: Every day | ORAL | Status: DC
  Administered 2024-12-09: 75 mg via ORAL
  Filled 2024-12-09: qty 1

## 2024-12-09 MED ORDER — ROPINIROLE HCL 1 MG PO TABS
1.0000 mg | ORAL_TABLET | Freq: Every day | ORAL | Status: DC
  Administered 2024-12-09: 1 mg via ORAL
  Filled 2024-12-09: qty 1

## 2024-12-09 MED ORDER — ATORVASTATIN CALCIUM 20 MG PO TABS
80.0000 mg | ORAL_TABLET | Freq: Every day | ORAL | Status: DC
  Administered 2024-12-10: 80 mg via ORAL
  Filled 2024-12-09: qty 4

## 2024-12-09 MED ORDER — OXYCODONE HCL 5 MG PO TABS
15.0000 mg | ORAL_TABLET | ORAL | Status: DC | PRN
  Administered 2024-12-09 – 2024-12-10 (×6): 15 mg via ORAL
  Filled 2024-12-09 (×6): qty 3

## 2024-12-09 NOTE — ED Notes (Signed)
 Dr. Flynn aware of pts low blood pressure. No new orders a this time, pt asleep.

## 2024-12-09 NOTE — Consult Note (Signed)
 Pharmacy Antibiotic Note  Bradley Sawyer is a 69 y.o. male with PMH including COPD admitted on 12/08/2024 with concerns for sepsis (source unknown). Pharmacy has been consulted for vancomycin  and cefepime  dosing.  Assessment 12/09/24: Patient is afebrile, WBC stable at 6.1, and lactate 1.4. CT chest on 1/17 showed airspace opacify in the inferior right lower lobe, concerning for infectious vs inflammatory in etiology. Patient's creatinine was 3.36 upon admission, but improved to 2.22, though remains above baseline (~0.8).  Plan: - Vancomycin  loading dose of 1750mg  was given 1/17- 1000mg  @ 1820 and 750mg  @ 2213) - Order MRSA PCR - Order vancomycin  random level for 1600 and consider redose depending on if continuing therapy - Change cefepime  from 2g IV q24h to 2g IV q12h based on improved renal function - Continue metronidazole  500mg  IV q12h - Continue to monitor renal function and cultures for adjustments  Height: 5' 11 (180.3 cm) Weight: 76.1 kg (167 lb 12.3 oz) IBW/kg (Calculated) : 75.3  Temp (24hrs), Avg:98.2 F (36.8 C), Min:97.8 F (36.6 C), Max:98.5 F (36.9 C)  Recent Labs  Lab 12/08/24 1541 12/08/24 1914 12/09/24 0601  WBC 7.4  --  6.1  CREATININE 3.69*  --  2.22*  LATICACIDVEN 2.3* 1.4  --     Estimated Creatinine Clearance: 33.9 mL/min (A) (by C-G formula based on SCr of 2.22 mg/dL (H)).    Allergies[1]  Antimicrobials this admission: Vancomycin  1/17 >> Cefepime  1/17 >> Metronidazole  1/17 >>  Microbiology results: 1/17 BCx: pending  1/18 MRSA PCR: pending  Thank you for allowing pharmacy to be a part of this patients care.  Mardy LOISE Lime, PharmD Candidate Clinical Pharmacy Intern 12/09/2024 9:27 AM     [1]  Allergies Allergen Reactions   Bee Venom Swelling   Tapentadol Shortness Of Breath    Other Reaction(s): sob/tachycardia

## 2024-12-09 NOTE — Progress Notes (Signed)
 " PROGRESS NOTE    Bradley Sawyer  FMW:[id number] DOB: [birth year] DOA: 12/08/2024 PCP: Bobbette Coye LABOR, MD   Brief Narrative:   69 y.o. male with medical history significant of COPD, GERD, CVA, CAD, HLD, GAD, gout, anemia who presents to the ED with shortness of breath and nausea.  1/18: DC tele, IV Abx, switch to PO Abx, transfer to med-surg   Assessment & Plan:   Principal Problem:   Sepsis (HCC) Active Problems:   AKI (acute kidney injury)   Severe Sepsis - ruled out. No obvious s/s of infection. At baseline On 4L [place] at home - no increased oxygen demand - DC telemetry - DC iv abx - transition to po Augmentin  empirically - DC IV steroid (Solu-Cortef  twice daily) - his hypotension was likely due to severe dehydration.   AKI Significant increase in Creatinine over baseline. D/t poor intake/vomiting.  - improving with hydration. Continue IVF  HL Resume statin  H/o CVA Resume plavix  and statin   Chronic pain Resume home oxycodone    DVT prophylaxis: Lovenox  enoxaparin  (LOVENOX ) injection 40 mg Start: 12/10/24 1000     Code Status: Full Code Family Communication: none at bedside Disposition Plan: possible DC tomorrow    Antimicrobials:  Augmentin     Subjective:  No further n/v, SOB - tolerating diet, BP stable  Objective: Vitals:   12/09/24 0600 12/09/24 0835 12/09/24 1155 12/09/24 1634  BP:  114/72 117/67 115/75  Pulse:  (!) 56 64 68  Resp:  18 18 18   Temp:  98.2 F (36.8 C) 98.3 F (36.8 C) 98.5 F (36.9 C)  TempSrc:    Oral  SpO2:  97% 100% 98%  Weight: 76.1 kg     Height: 5' 11 (1.803 m)       Intake/Output Summary (Last 24 hours) at 12/09/2024 1652 Last data filed at 12/09/2024 1500 Gross per 24 hour  Intake 4894.19 ml  Output 1225 ml  Net 3669.19 ml   Filed Weights   12/08/24 1445 12/09/24 0600  Weight: 84.7 kg 76.1 kg    Examination:  General exam: Appears calm and comfortable  Respiratory system: Clear to auscultation.  Respiratory effort normal. Cardiovascular system: S1 & S2 heard, RRR. No JVD, murmurs, rubs, gallops or clicks. No pedal edema. Gastrointestinal system: Abdomen is soft, benign Central nervous system: Alert and oriented. No focal neurological deficits. Extremities: Symmetric 5 x 5 power. Skin: No rashes, lesions or ulcers Psychiatry: Judgement and insight appear normal. Mood & affect appropriate.     Data Reviewed: I have personally reviewed following labs and imaging studies  CBC: Recent Labs  Lab 12/08/24 1541 12/09/24 0601  WBC 7.4 6.1  NEUTROABS 4.7  --   HGB 10.4* 8.7*  HCT 32.0* 25.7*  MCV 83.6 82.1  PLT 196 168   Basic Metabolic Panel: Recent Labs  Lab 12/08/24 1541 12/09/24 0601  NA 130* 132*  K 4.0 4.4  CL 89* 98  CO2 26 25  GLUCOSE 112* 110*  BUN 36* 29*  CREATININE 3.69* 2.22*  CALCIUM  9.7 9.4  MG 1.7  --    GFR: Estimated Creatinine Clearance: 33.9 mL/min (A) (by C-G formula based on SCr of 2.22 mg/dL (H)). Liver Function Tests: Recent Labs  Lab 12/08/24 1541 12/09/24 0601  AST 17 19  ALT 11 9  ALKPHOS 108 92  BILITOT 0.3 0.3  PROT 7.1 6.3*  ALBUMIN 4.2 3.6   Recent Labs  Lab 12/08/24 1541  LIPASE 26   No results for  input(s): AMMONIA in the last 168 hours. Coagulation Profile: Recent Labs  Lab 12/09/24 0601  INR 1.1   Cardiac Enzymes: Recent Labs  Lab 12/08/24 1644  CKTOTAL 55   BNP (last 3 results) No results for input(s): PROBNP in the last 8760 hours. HbA1C: No results for input(s): HGBA1C in the last 72 hours. CBG: Recent Labs  Lab 12/09/24 0646 12/09/24 1636  GLUCAP 123* 159*    Sepsis Labs: Recent Labs  Lab 12/08/24 1541 12/08/24 1914  LATICACIDVEN 2.3* 1.4    Recent Results (from the past 240 hours)  Blood Culture (routine x 2)     Status: None (Preliminary result)   Collection Time: 12/08/24  3:34 PM   Specimen: BLOOD  Result Value Ref Range Status   Specimen Description BLOOD RIGHT ANTECUBITAL   Final   Special Requests   Final    BOTTLES DRAWN AEROBIC AND ANAEROBIC Blood Culture adequate volume   Culture   Final    NO GROWTH < 24 HOURS Performed at [facility], [address] [place], [place] [address]    Report Status PENDING  Incomplete  Blood Culture (routine x 2)     Status: None (Preliminary result)   Collection Time: 12/08/24  3:41 PM   Specimen: BLOOD  Result Value Ref Range Status   Specimen Description BLOOD LEFT ANTECUBITAL  Final   Special Requests   Final    BOTTLES DRAWN AEROBIC AND ANAEROBIC Blood Culture results may not be optimal due to an inadequate volume of blood received in culture bottles   Culture   Final    NO GROWTH < 24 HOURS Performed at [facility], [address], [place], [place] [address]    Report Status PENDING  Incomplete  Resp panel by RT-PCR (RSV, Flu A&B, Covid) Anterior Nasal Swab     Status: None   Collection Time: 12/08/24  3:41 PM   Specimen: Anterior Nasal Swab  Result Value Ref Range Status   SARS Coronavirus 2 by RT PCR NEGATIVE NEGATIVE Final    Comment: (NOTE) SARS-CoV-2 target nucleic acids are NOT DETECTED.  The SARS-CoV-2 RNA is generally detectable in upper respiratory specimens during the acute phase of infection. The lowest concentration of SARS-CoV-2 viral copies this assay can detect is 138 copies/mL. A negative result does not preclude SARS-Cov-2 infection and should not be used as the sole basis for treatment or other patient management decisions. A negative result may occur with  improper specimen collection/handling, submission of specimen other than nasopharyngeal swab, presence of viral mutation(s) within the areas targeted by this assay, and inadequate number of viral copies(<138 copies/mL). A negative result must be combined with clinical observations, patient history, and epidemiological information. The expected result is Negative.  Fact Sheet for Patients:   bloggercourse.com  Fact Sheet for Healthcare Providers:  seriousbroker.it  This test is no t yet approved or cleared by the [place]  FDA and  has been authorized for detection and/or diagnosis of SARS-CoV-2 by FDA under an Emergency Use Authorization (EUA). This EUA will remain  in effect (meaning this test can be used) for the duration of the COVID-19 declaration under Section 564(b)(1) of the Act, 21 U.S.C.section 360bbb-3(b)(1), unless the authorization is terminated  or revoked sooner.       Influenza A by PCR NEGATIVE NEGATIVE Final   Influenza B by PCR NEGATIVE NEGATIVE Final    Comment: (NOTE) The Xpert Xpress SARS-CoV-2/FLU/RSV plus assay is intended as an aid in the diagnosis  of influenza from Nasopharyngeal swab specimens and should not be used as a sole basis for treatment. Nasal washings and aspirates are unacceptable for Xpert Xpress SARS-CoV-2/FLU/RSV testing.  Fact Sheet for Patients: bloggercourse.com  Fact Sheet for Healthcare Providers: seriousbroker.it  This test is not yet approved or cleared by the [place]  FDA and has been authorized for detection and/or diagnosis of SARS-CoV-2 by FDA under an Emergency Use Authorization (EUA). This EUA will remain in effect (meaning this test can be used) for the duration of the COVID-19 declaration under Section 564(b)(1) of the Act, 21 U.S.C. section 360bbb-3(b)(1), unless the authorization is terminated or revoked.     Resp Syncytial Virus by PCR NEGATIVE NEGATIVE Final    Comment: (NOTE) Fact Sheet for Patients: bloggercourse.com  Fact Sheet for Healthcare Providers: seriousbroker.it  This test is not yet approved or cleared by the [place]  FDA and has been authorized for detection and/or diagnosis of SARS-CoV-2 by FDA under an Emergency Use  Authorization (EUA). This EUA will remain in effect (meaning this test can be used) for the duration of the COVID-19 declaration under Section 564(b)(1) of the Act, 21 U.S.C. section 360bbb-3(b)(1), unless the authorization is terminated or revoked.  Performed at [facility], [address] [place], [place] [address]          Radiology Studies: CT CHEST ABDOMEN PELVIS WO CONTRAST Result Date: 12/08/2024 EXAM: CT CHEST WITHOUT CONTRAST 12/08/2024 05:16:11 PM TECHNIQUE: CT of the chest was performed without the administration of intravenous contrast. Multiplanar reformatted images are provided for review. Automated exposure control, iterative reconstruction, and/or weight based adjustment of the mA/kV was utilized to reduce the radiation dose to as low as reasonably achievable. COMPARISON: CT angiogram chest 09/23/2024 and CT renal stone protocol 09/26/2024. CLINICAL HISTORY: Sepsis; hypotension, aki. FINDINGS: MEDIASTINUM: Coronary and aortic atherosclerotic calcifications are noted. Pericardium is unremarkable. The central airways are clear. LYMPH NODES: No mediastinal, hilar or axillary lymphadenopathy. LUNGS AND PLEURA: There is a small amount of patchy ground glass and airspace opacity in the inferior right lower lobe. There is mild left lower lobe mucous plugging similar to prior. Right lower lobe mucous plugging has resolved in the interval. Pseudotomy changes are present. No pulmonary edema. No pleural effusion or pneumothorax. SOFT TISSUES/BONES: Sternotomy plates are present. Fusion changes at L1, L2, L3 appear similar to prior. Mild chronic compression deformity of T12 is unchanged. No acute abnormality of the soft tissues. UPPER ABDOMEN: The gallbladder is surgically absent. There is a 2.7 cm cyst in the inferior pole of the right kidney. There is mild diffuse bladder wall thickening versus normal underdistention. The appendix appears normal. There is sigmoid colon  diverticulosis. There are atherosclerotic calcifications of the aorta and iliac arteries. Pericecal gland is enlarged, unchanged. Limited images of the upper abdomen demonstrates no other acute abnormality. IMPRESSION: 1. Small amount of patchy ground glass and airspace opacity in the inferior right lower lobe, which may be infectious or inflammatory in etiology. 2. Mild left lower lobe mucous plugging, similar to prior. 3. Right lower lobe mucous plugging has resolved in the interval. 4. Bladder wall thickening versus normal underdistension. Correlate clinically for cystitis. 5. Sigmoid colon diverticulosis. Electronically signed by: Greig Pique MD 12/08/2024 05:35 PM EST RP Workstation: [id number]   DG Chest 2 View Result Date: 12/08/2024 EXAM: 2 VIEW(S) XRAY OF THE CHEST 12/08/2024 03:38:21 PM COMPARISON: 10/12/2024 CLINICAL HISTORY: SOB, hx COPD SOB, hx COPD SOB, hx COPD SOB, hx COPD SOB, hx COPD FINDINGS: LUNGS  AND PLEURA: Mild hyperinflation. No focal pulmonary opacity. No pleural effusion. No pneumothorax. HEART AND MEDIASTINUM: Aortic arch calcifications. No acute abnormality of the cardiac and mediastinal silhouettes. BONES AND SOFT TISSUES: Partially visualized left lateral upper lumbar spine fusion hardware. Median sternotomy noted. Thoracic spondylosis. IMPRESSION: 1. No acute findings. 2. Mild hyperinflation, consistent with COPD. Electronically signed by: Greig Pique MD 12/08/2024 04:07 PM EST RP Workstation: [id number]        Scheduled Meds:  [START ON 12/10/2024] enoxaparin  (LOVENOX ) injection  40 mg Subcutaneous Q24H   pantoprazole   40 mg Oral BID AC   Continuous Infusions:   LOS: 1 day    Time spent: 35 mins    Nayquan Evinger Maree, MD [facility] Pager [phone]  If 7PM-7AM, please contact night-coverage [email or web address]  12/09/2024, 4:52 PM   "

## 2024-12-10 ENCOUNTER — Other Ambulatory Visit: Payer: Self-pay

## 2024-12-10 DIAGNOSIS — R0600 Dyspnea, unspecified: Secondary | ICD-10-CM

## 2024-12-10 DIAGNOSIS — I959 Hypotension, unspecified: Secondary | ICD-10-CM

## 2024-12-10 DIAGNOSIS — I2510 Atherosclerotic heart disease of native coronary artery without angina pectoris: Secondary | ICD-10-CM | POA: Diagnosis not present

## 2024-12-10 DIAGNOSIS — N179 Acute kidney failure, unspecified: Secondary | ICD-10-CM | POA: Diagnosis not present

## 2024-12-10 LAB — BASIC METABOLIC PANEL WITH GFR
Anion gap: 8 (ref 5–15)
BUN: 16 mg/dL (ref 8–23)
CO2: 30 mmol/L (ref 22–32)
Calcium: 9.1 mg/dL (ref 8.9–10.3)
Chloride: 98 mmol/L (ref 98–111)
Creatinine, Ser: 1.09 mg/dL (ref 0.61–1.24)
GFR, Estimated: >60 mL/min
Glucose, Bld: 90 mg/dL (ref 70–99)
Potassium: 4.2 mmol/L (ref 3.5–5.1)
Sodium: 136 mmol/L (ref 135–145)

## 2024-12-10 LAB — PROTIME-INR
INR: 1.1 (ref 0.8–1.2)
Prothrombin Time: 15.2 s (ref 11.4–15.2)

## 2024-12-10 LAB — CBC
HCT: 27.2 % — ABNORMAL LOW (ref 39.0–52.0)
Hemoglobin: 9 g/dL — ABNORMAL LOW (ref 13.0–17.0)
MCH: 28 pg (ref 26.0–34.0)
MCHC: 33.1 g/dL (ref 30.0–36.0)
MCV: 84.5 fL (ref 80.0–100.0)
Platelets: 148 K/uL — ABNORMAL LOW (ref 150–400)
RBC: 3.22 MIL/uL — ABNORMAL LOW (ref 4.22–5.81)
RDW: 15.2 % (ref 11.5–15.5)
WBC: 7.6 K/uL (ref 4.0–10.5)
nRBC: 0 % (ref 0.0–0.2)

## 2024-12-10 LAB — URINE DRUG SCREEN
Amphetamines: NEGATIVE
Barbiturates: NEGATIVE
Benzodiazepines: NEGATIVE
Cocaine: NEGATIVE
Fentanyl: NEGATIVE
Methadone Scn, Ur: NEGATIVE
Opiates: NEGATIVE
Tetrahydrocannabinol: NEGATIVE

## 2024-12-10 MED ORDER — MONTELUKAST SODIUM 10 MG PO TABS
10.0000 mg | ORAL_TABLET | Freq: Every day | ORAL | 0 refills | Status: AC
  Filled 2024-12-10: qty 30, 30d supply, fill #0

## 2024-12-10 MED ORDER — AMOXICILLIN-POT CLAVULANATE 875-125 MG PO TABS
1.0000 | ORAL_TABLET | Freq: Two times a day (BID) | ORAL | 0 refills | Status: AC
  Filled 2024-12-10: qty 6, 3d supply, fill #0

## 2024-12-10 MED ORDER — ALBUTEROL SULFATE HFA 108 (90 BASE) MCG/ACT IN AERS
2.0000 | INHALATION_SPRAY | RESPIRATORY_TRACT | 11 refills | Status: AC | PRN
  Filled 2024-12-10: qty 18, 30d supply, fill #0
  Filled 2024-12-10: qty 18, 25d supply, fill #0
  Filled 2024-12-10: qty 6.7, 10d supply, fill #0

## 2024-12-10 NOTE — Discharge Summary (Signed)
 " Physician Discharge Summary   Patient: Bradley Sawyer MRN: [id number] DOB: 25-Mar-1956  Admit date:     12/08/2024  Discharge date: 12/10/24  Discharge Physician: Cresencio Fairly   PCP: Bobbette Coye LABOR, MD   Recommendations at discharge:    F/up with outpt providers as requested  Discharge Diagnoses: Principal Problem:   Sepsis [facility]) Active Problems:   AKI (acute kidney injury)   Dyspnea  Hospital Course: Assessment and Plan:  69 y.o. male with medical history significant of COPD, GERD, CVA, CAD, HLD, GAD, gout, anemia who presents to the ED with shortness of breath and nausea.   1/18: DC tele, IV Abx, switch to PO Abx, transfer to med-surg    Severe Sepsis - ruled out. No obvious s/s of infection. At baseline On 4L [place] at home - no increased oxygen demand - DC telemetry - DC iv abx - transition to po Augmentin  empirically - DC IV steroid (Solu-Cortef  twice daily) - his hypotension was likely due to severe dehydration.   AKI Significant increase in Creatinine over baseline. D/t poor intake/vomiting.  - resolved with hydration   HL  statin   H/o CVA plavix  and statin   Chronic pain oxycodone    He would like to go home and is medically stable       Disposition: Home Diet recommendation:  Carb modified diet DISCHARGE MEDICATION: Allergies as of 12/10/2024       Reactions   Bee Venom Swelling   Tapentadol Shortness Of Breath   Other Reaction(s): sob/tachycardia        Medication List     STOP taking these medications    cyclobenzaprine  10 MG tablet Commonly known as: FLEXERIL    potassium chloride  SA 20 MEQ tablet Commonly known as: KLOR-CON  M       TAKE these medications    rOPINIRole  1 MG tablet Commonly known as: REQUIP  Take 1 tablet by mouth at bedtime. The timing of this medication is very important.   albuterol  108 (90 Base) MCG/ACT inhaler Commonly known as: VENTOLIN  HFA Inhale 2 puffs into the lungs every 4 (four) hours as needed  for wheezing or shortness of breath. What changed: See the new instructions.   amLODipine  5 MG tablet Commonly known as: NORVASC  Take 5 mg by mouth daily. What changed: Another medication with the same name was removed. Continue taking this medication, and follow the directions you see here.   amoxicillin -clavulanate 875-125 MG tablet Commonly known as: AUGMENTIN  Take 1 tablet by mouth every 12 (twelve) hours for 3 days.   aspirin  EC 81 MG tablet Take 81 mg by mouth daily.   atorvastatin  80 MG tablet Commonly known as: LIPITOR  TAKE ONE TABLET BY MOUTH ONCE DAILY AT BEDTIME   Breztri  Aerosphere 160-9-4.8 MCG/ACT Aero inhaler Generic drug: budesonide -glycopyrrolate -formoterol  INHALE TWO PUFFS into THE lungs TWICE DAILY morning & bedtime   clopidogrel  75 MG tablet Commonly known as: PLAVIX  TAKE ONE TABLET BY MOUTH ONCE DAILY   colchicine  0.6 MG tablet Take 1 tablet (0.6 mg total) by mouth daily.   cyanocobalamin  1000 MCG tablet Take 1 tablet (1,000 mcg total) by mouth daily.   EPINEPHrine  0.3 mg/0.3 mL Soaj injection Commonly known as: EPI-PEN Inject 0.3 mg into the muscle as needed for anaphylaxis.   famotidine  40 MG tablet Commonly known as: PEPCID  TAKE ONE TABLET BY MOUTH ONCE DAILY   ferrous sulfate  325 (65 FE) MG tablet Take 1 tablet (325 mg total) by mouth daily with breakfast.   FLUoxetine  40  MG capsule Commonly known as: PROZAC  Take 40 mg by mouth daily.   furosemide  20 MG tablet Commonly known as: LASIX  Take 20 mg by mouth daily.   ipratropium-albuterol  0.5-2.5 (3) MG/3ML Soln Commonly known as: DUONEB Take 3 mLs by nebulization every 6 (six) hours as needed.   isosorbide  mononitrate 30 MG 24 hr tablet Commonly known as: IMDUR  Take 30 mg by mouth daily.   levocetirizine 5 MG tablet Commonly known as: XYZAL  Take 5 mg by mouth at bedtime.   linaclotide  290 MCG Caps capsule Commonly known as: LINZESS  Take 290 mcg by mouth daily before breakfast.    lisinopril -hydrochlorothiazide  20-25 MG tablet Commonly known as: ZESTORETIC  Take 1 tablet by mouth daily.   metFORMIN  500 MG tablet Commonly known as: GLUCOPHAGE  Take 500 mg by mouth daily.   montelukast  10 MG tablet Commonly known as: SINGULAIR  Take 1 tablet (10 mg total) by mouth daily.   Multi-Vitamins Tabs Take 1 tablet by mouth daily.   naloxone  4 MG/0.1ML Liqd nasal spray kit Commonly known as: NARCAN  Place 0.4 mg into the nose once.   nitroGLYCERIN  0.4 MG SL tablet Commonly known as: NITROSTAT  DISSOLVE (1) TABLET UNDER TONGUE AS NEEDED TO RELIEVE CHEST PAIN. MAY REPEAT EVERY 5 MINUTES.   ondansetron  4 MG tablet Commonly known as: ZOFRAN  Take 4 mg by mouth every 8 (eight) hours as needed for vomiting or nausea.   oxyCODONE  15 MG immediate release tablet Commonly known as: ROXICODONE  Take 1 tablet (15 mg total) by mouth every 4 (four) hours.   OXYGEN Inhale 3 L into the lungs.   pantoprazole  40 MG tablet Commonly known as: PROTONIX  Take 1 tablet (40 mg total) by mouth 2 (two) times daily before a meal. LAST REFILL MUST SCHEDULE VISIT   polyethylene glycol 17 g packet Commonly known as: MIRALAX  / GLYCOLAX  Take 17 g by mouth daily as needed (for constipation).   spironolactone  25 MG tablet Commonly known as: ALDACTONE  TAKE ONE TABLET BY MOUTH ONCE DAILY   tamsulosin  0.4 MG Caps capsule Commonly known as: FLOMAX  TAKE ONE CAPSULE BY MOUTH EVERY DAY        Follow-up Information     Corrington, Kip A, MD. Schedule an appointment as soon as possible for a visit in 1 week(s).   Specialty: Family Medicine Why: [facility] Discharge F/UP Contact information: [address] [place] [address] [phone]         Tamea Dedra CROME, MD. Go in 2 week(s).   Specialty: Pulmonary Disease Why: Appointment is scheduled for Thursday 12/13/2024 @ 9:00 am. Located in the [facility]. Please arrive @ 8:45 am. Also bring a list of your  current medications. Contact information: [address] [place] [address] [place] [address] [phone]                Discharge Exam: Fredricka Weights   12/08/24 1445 12/09/24 0600  Weight: 84.7 kg 76.1 kg   General exam: Appears calm and comfortable  Respiratory system: Clear to auscultation. Respiratory effort normal. Cardiovascular system: S1 & S2 heard, RRR. No murmurs, . No pedal edema. Gastrointestinal system: Abdomen is soft, benign Central nervous system: Alert and oriented. No focal neurological deficits. Extremities: Symmetric 5 x 5 power. Skin: No rashes, lesions or ulcers Psychiatry: Judgement and insight appear normal. Mood & affect appropriate.   Condition at discharge: fair  The results of significant diagnostics from this hospitalization (including imaging, microbiology, ancillary and laboratory) are listed below for reference.  Imaging Studies: CT CHEST ABDOMEN PELVIS WO CONTRAST Result Date: 12/08/2024 EXAM: CT CHEST WITHOUT CONTRAST 12/08/2024 05:16:11 PM TECHNIQUE: CT of the chest was performed without the administration of intravenous contrast. Multiplanar reformatted images are provided for review. Automated exposure control, iterative reconstruction, and/or weight based adjustment of the mA/kV was utilized to reduce the radiation dose to as low as reasonably achievable. COMPARISON: CT angiogram chest 09/23/2024 and CT renal stone protocol 09/26/2024. CLINICAL HISTORY: Sepsis; hypotension, aki. FINDINGS: MEDIASTINUM: Coronary and aortic atherosclerotic calcifications are noted. Pericardium is unremarkable. The central airways are clear. LYMPH NODES: No mediastinal, hilar or axillary lymphadenopathy. LUNGS AND PLEURA: There is a small amount of patchy ground glass and airspace opacity in the inferior right lower lobe. There is mild left lower lobe mucous plugging similar to prior. Right lower lobe mucous plugging has resolved in the interval. Pseudotomy  changes are present. No pulmonary edema. No pleural effusion or pneumothorax. SOFT TISSUES/BONES: Sternotomy plates are present. Fusion changes at L1, L2, L3 appear similar to prior. Mild chronic compression deformity of T12 is unchanged. No acute abnormality of the soft tissues. UPPER ABDOMEN: The gallbladder is surgically absent. There is a 2.7 cm cyst in the inferior pole of the right kidney. There is mild diffuse bladder wall thickening versus normal underdistention. The appendix appears normal. There is sigmoid colon diverticulosis. There are atherosclerotic calcifications of the aorta and iliac arteries. Pericecal gland is enlarged, unchanged. Limited images of the upper abdomen demonstrates no other acute abnormality. IMPRESSION: 1. Small amount of patchy ground glass and airspace opacity in the inferior right lower lobe, which may be infectious or inflammatory in etiology. 2. Mild left lower lobe mucous plugging, similar to prior. 3. Right lower lobe mucous plugging has resolved in the interval. 4. Bladder wall thickening versus normal underdistension. Correlate clinically for cystitis. 5. Sigmoid colon diverticulosis. Electronically signed by: Greig Pique MD 12/08/2024 05:35 PM EST RP Workstation: [id number]   DG Chest 2 View Result Date: 12/08/2024 EXAM: 2 VIEW(S) XRAY OF THE CHEST 12/08/2024 03:38:21 PM COMPARISON: 10/12/2024 CLINICAL HISTORY: SOB, hx COPD SOB, hx COPD SOB, hx COPD SOB, hx COPD SOB, hx COPD FINDINGS: LUNGS AND PLEURA: Mild hyperinflation. No focal pulmonary opacity. No pleural effusion. No pneumothorax. HEART AND MEDIASTINUM: Aortic arch calcifications. No acute abnormality of the cardiac and mediastinal silhouettes. BONES AND SOFT TISSUES: Partially visualized left lateral upper lumbar spine fusion hardware. Median sternotomy noted. Thoracic spondylosis. IMPRESSION: 1. No acute findings. 2. Mild hyperinflation, consistent with COPD. Electronically signed by: Greig Pique MD  12/08/2024 04:07 PM EST RP Workstation: [id number]    Microbiology: Results for orders placed or performed during the hospital encounter of 12/08/24  Blood Culture (routine x 2)     Status: None (Preliminary result)   Collection Time: 12/08/24  3:34 PM   Specimen: BLOOD  Result Value Ref Range Status   Specimen Description BLOOD RIGHT ANTECUBITAL  Final   Special Requests   Final    BOTTLES DRAWN AEROBIC AND ANAEROBIC Blood Culture adequate volume   Culture   Final    NO GROWTH 2 DAYS Performed at [facility], [address] [place], [place] [address]    Report Status PENDING  Incomplete  Blood Culture (routine x 2)     Status: None (Preliminary result)   Collection Time: 12/08/24  3:41 PM   Specimen: BLOOD  Result Value Ref Range Status   Specimen Description BLOOD LEFT ANTECUBITAL  Final   Special Requests   Final  BOTTLES DRAWN AEROBIC AND ANAEROBIC Blood Culture results may not be optimal due to an inadequate volume of blood received in culture bottles   Culture   Final    NO GROWTH 2 DAYS Performed at [facility], [address] [place], [place] [address]    Report Status PENDING  Incomplete  Resp panel by RT-PCR (RSV, Flu A&B, Covid) Anterior Nasal Swab     Status: None   Collection Time: 12/08/24  3:41 PM   Specimen: Anterior Nasal Swab  Result Value Ref Range Status   SARS Coronavirus 2 by RT PCR NEGATIVE NEGATIVE Final    Comment: (NOTE) SARS-CoV-2 target nucleic acids are NOT DETECTED.  The SARS-CoV-2 RNA is generally detectable in upper respiratory specimens during the acute phase of infection. The lowest concentration of SARS-CoV-2 viral copies this assay can detect is 138 copies/mL. A negative result does not preclude SARS-Cov-2 infection and should not be used as the sole basis for treatment or other patient management decisions. A negative result may occur with  improper specimen collection/handling, submission of specimen  other than nasopharyngeal swab, presence of viral mutation(s) within the areas targeted by this assay, and inadequate number of viral copies(<138 copies/mL). A negative result must be combined with clinical observations, patient history, and epidemiological information. The expected result is Negative.  Fact Sheet for Patients:  bloggercourse.com  Fact Sheet for Healthcare Providers:  seriousbroker.it  This test is no t yet approved or cleared by the [place]  FDA and  has been authorized for detection and/or diagnosis of SARS-CoV-2 by FDA under an Emergency Use Authorization (EUA). This EUA will remain  in effect (meaning this test can be used) for the duration of the COVID-19 declaration under Section 564(b)(1) of the Act, 21 U.S.C.section 360bbb-3(b)(1), unless the authorization is terminated  or revoked sooner.       Influenza A by PCR NEGATIVE NEGATIVE Final   Influenza B by PCR NEGATIVE NEGATIVE Final    Comment: (NOTE) The Xpert Xpress SARS-CoV-2/FLU/RSV plus assay is intended as an aid in the diagnosis of influenza from Nasopharyngeal swab specimens and should not be used as a sole basis for treatment. Nasal washings and aspirates are unacceptable for Xpert Xpress SARS-CoV-2/FLU/RSV testing.  Fact Sheet for Patients: bloggercourse.com  Fact Sheet for Healthcare Providers: seriousbroker.it  This test is not yet approved or cleared by the [place]  FDA and has been authorized for detection and/or diagnosis of SARS-CoV-2 by FDA under an Emergency Use Authorization (EUA). This EUA will remain in effect (meaning this test can be used) for the duration of the COVID-19 declaration under Section 564(b)(1) of the Act, 21 U.S.C. section 360bbb-3(b)(1), unless the authorization is terminated or revoked.     Resp Syncytial Virus by PCR NEGATIVE NEGATIVE Final     Comment: (NOTE) Fact Sheet for Patients: bloggercourse.com  Fact Sheet for Healthcare Providers: seriousbroker.it  This test is not yet approved or cleared by the [place]  FDA and has been authorized for detection and/or diagnosis of SARS-CoV-2 by FDA under an Emergency Use Authorization (EUA). This EUA will remain in effect (meaning this test can be used) for the duration of the COVID-19 declaration under Section 564(b)(1) of the Act, 21 U.S.C. section 360bbb-3(b)(1), unless the authorization is terminated or revoked.  Performed at [facility], [address], [place], [place] [address]   MRSA Next Gen by PCR, Nasal     Status: None   Collection Time: 12/09/24  9:21 AM   Specimen:  Nasal Mucosa; Nasal Swab  Result Value Ref Range Status   MRSA by PCR Next Gen NOT DETECTED NOT DETECTED Final    Comment: (NOTE) The GeneXpert MRSA Assay (FDA approved for NASAL specimens only), is one component of a comprehensive MRSA colonization surveillance program. It is not intended to diagnose MRSA infection nor to guide or monitor treatment for MRSA infections. Test performance is not FDA approved in patients less than 62 years old. Performed at [facility], [address] [place], [place] [address]     Labs: CBC: Recent Labs  Lab 12/08/24 1541 12/09/24 0601 12/10/24 0630  WBC 7.4 6.1 7.6  NEUTROABS 4.7  --   --   HGB 10.4* 8.7* 9.0*  HCT 32.0* 25.7* 27.2*  MCV 83.6 82.1 84.5  PLT 196 168 148*   Basic Metabolic Panel: Recent Labs  Lab 12/08/24 1541 12/09/24 0601 12/10/24 0630  NA 130* 132* 136  K 4.0 4.4 4.2  CL 89* 98 98  CO2 26 25 30   GLUCOSE 112* 110* 90  BUN 36* 29* 16  CREATININE 3.69* 2.22* 1.09  CALCIUM  9.7 9.4 9.1  MG 1.7  --   --    Liver Function Tests: Recent Labs  Lab 12/08/24 1541 12/09/24 0601  AST 17 19  ALT 11 9  ALKPHOS 108 92  BILITOT 0.3 0.3  PROT 7.1 6.3*   ALBUMIN 4.2 3.6   CBG: Recent Labs  Lab 12/09/24 0646 12/09/24 1636  GLUCAP 123* 159*    Discharge time spent: greater than 30 minutes.  Signed: Cresencio Fairly, MD [facility] 12/10/2024 "

## 2024-12-10 NOTE — TOC Transition Note (Signed)
 Transition of Care [facility]) - Discharge Note   Patient Details  Name: Bradley Sawyer MRN: [id number] Date of Birth: [birth year]  Transition of Care [facility]) CM/SW Contact:  Nathanael CHRISTELLA Ring, RN Phone Number: 12/10/2024, 10:45 AM   Clinical Narrative:    Patient medically cleared for discharge home today.  He is from home where he lives alone in a camper in his son and daughter in laws yard.  He is on chronic oxygen through [employer].  Independent at home, son will be picking him up today.  Medications will be delivered to his room. His son will bring an oxygen tank for him to transport home.  No TOC needs.    Final next level of care: Home/Self Care Barriers to Discharge: Barriers Resolved   Patient Goals and CMS Choice            Discharge Placement                       Discharge Plan and Services Additional resources added to the After Visit Summary for                  DME Arranged: N/A         HH Arranged: NA          Social Drivers of Health (SDOH) Interventions SDOH Screenings   Food Insecurity: No Food Insecurity (12/09/2024)  Housing: Low Risk (12/09/2024)  Transportation Needs: No Transportation Needs (12/09/2024)  Utilities: Not At Risk (12/09/2024)  Depression (PHQ2-9): Medium Risk (01/22/2022)  Financial Resource Strain: Low Risk (12/19/2023)   Received from [facility]  Physical Activity: Unknown (10/31/2023)   Received from [facility]  Social Connections: Socially Isolated (12/09/2024)  Stress: No Stress Concern Present (10/31/2023)   Received from [facility]  Tobacco Use: Medium Risk (12/09/2024)     Readmission Risk Interventions     No data to display

## 2024-12-10 NOTE — Progress Notes (Incomplete)
 Discharge orders were reviewed with patient. Questions were encouraged and answered. IV weas

## 2024-12-13 ENCOUNTER — Encounter: Payer: Self-pay | Admitting: Pulmonary Disease

## 2024-12-13 ENCOUNTER — Ambulatory Visit: Admitting: Pulmonary Disease

## 2024-12-13 VITALS — BP 108/64 | HR 73 | Temp 98.1°F | Ht 71.0 in | Wt 167.8 lb

## 2024-12-13 DIAGNOSIS — J9611 Chronic respiratory failure with hypoxia: Secondary | ICD-10-CM

## 2024-12-13 DIAGNOSIS — J4489 Other specified chronic obstructive pulmonary disease: Secondary | ICD-10-CM | POA: Diagnosis not present

## 2024-12-13 DIAGNOSIS — I5189 Other ill-defined heart diseases: Secondary | ICD-10-CM

## 2024-12-13 DIAGNOSIS — Z91199 Patient's noncompliance with other medical treatment and regimen due to unspecified reason: Secondary | ICD-10-CM

## 2024-12-13 DIAGNOSIS — D649 Anemia, unspecified: Secondary | ICD-10-CM

## 2024-12-13 DIAGNOSIS — Z87891 Personal history of nicotine dependence: Secondary | ICD-10-CM

## 2024-12-13 DIAGNOSIS — Z9981 Dependence on supplemental oxygen: Secondary | ICD-10-CM | POA: Diagnosis not present

## 2024-12-13 DIAGNOSIS — J449 Chronic obstructive pulmonary disease, unspecified: Secondary | ICD-10-CM

## 2024-12-13 LAB — CULTURE, BLOOD (ROUTINE X 2)
Culture: NO GROWTH
Culture: NO GROWTH
Special Requests: ADEQUATE

## 2024-12-13 NOTE — Patient Instructions (Addendum)
 VISIT SUMMARY:  During your visit, we discussed your ongoing issues with COPD and asthma overlap, your recent hospitalization for acute kidney injury, and your challenges with your current oxygen setup. We also reviewed your discontinuation of Dupixent  and your concerns about low blood levels.  YOUR PLAN:  -COPD WITH ASTHMA OVERLAP SYNDROME: You have a condition where chronic obstructive pulmonary disease (COPD) and asthma occur together, making it difficult to breathe. You stopped using Dupixent  six months ago because it didn't seem to help, but you should continue using Breztri  and as needed albuterol .  You need to have your anemia investigated further, as it may be making your breathing problems worse, please discuss this with your primary care physician.  -CHRONIC RESPIRATORY FAILURE WITH OXYGEN DEPENDENCE: You have chronic respiratory failure, which means your lungs can't get enough oxygen into your blood. You rely on oxygen therapy, but you're having trouble getting a portable oxygen concentrator. We have sent another request for the portable oxygen concentrator and advised you to contact your supplier to arrange a walking test to fit the new equipment.  INSTRUCTIONS:  Please follow up on your upcoming appointment to address your low blood levels. Additionally, contact your oxygen supplier to set up an appointment for a walking test to fit a portable oxygen concentrator.

## 2024-12-13 NOTE — Progress Notes (Unsigned)
 "  Subjective:    Patient ID: Bradley Sawyer, male    DOB: 1956/04/30, 69 y.o.   MRN: [id number]  Patient Care Team: Corrington, Kip A, MD as PCP - General (Family Medicine) Tamea Dedra CROME, MD as Consulting Physician (Pulmonary Disease)  Chief Complaint  Patient presents with   Hospitalization Follow-up    Worsening shortness of breath. Occasional wheezing.     BACKGROUND/INTERVAL:Bradley Sawyer is a 69 year old former smoker, with problems as noted below, who presents for follow-up on the issue of severe COPD, stage III, with asthma overlap.  Asthma is moderate persistent with seasonal variation.  Patient has also had issues with respiratory failure with hypoxia.  Patient was last seen on 11 Apr 2024 as an acute visit. A pulmonary function test was attempted on 26 Mar 2024 which the patient could not perform due to dizziness.  Patient aborted the test.  He has been on supplemental oxygen but requires requalification.  He has been on Dupixent  for his asthmatic component.  He has been for the most part abstinent of cigarettes for 3-1/2 years but has had few relapses for brief periods of time.   HPI        DATA 06/17/2021 PFTs: FEV1 1.53 L or 42% predicted, FVC 2.87 L or 59% predicted, FEV1/FVC 53%, there is no significant bronchodilator response.  Lung volumes and DLCO are uninterpretable due to the patient having difficulty with the testing.  Consistent with severe obstructive defect. 03/26/2024 PFTs: Patient could not perform. Review of Systems A 10 point review of systems was performed and it is as noted above otherwise negative.   Patient Active Problem List   Diagnosis Date Noted   Dyspnea 12/10/2024   Sepsis (HCC) 12/08/2024   Sepsis due to pneumonia (HCC) 10/12/2024   Chronic pain disorder 09/06/2024   Gout attack 09/06/2024   Cellulitis of right foot 08/05/2024   Hypokalemia 08/05/2024   Hypomagnesemia 08/05/2024   Polyp of colon 09/14/2023   History of  colonic polyps 09/14/2023   Chest pain 08/26/2023   AKI (acute kidney injury) 08/26/2023   Hypotension 08/26/2023   Leucocytosis 08/26/2023   BPH (benign prostatic hyperplasia) 08/26/2023   Depression with anxiety 08/26/2023   Chronic respiratory failure with hypoxia (HCC) 03/30/2021   COPD with acute exacerbation (HCC) 02/27/2021   History of CVA (cerebrovascular accident) 02/27/2021   Chronic pain, radicular 02/27/2021   Chronic, continuous use of opioids 02/27/2021   Unstable angina (HCC) 02/19/2021   Cigarette nicotine  dependence without complication 05/24/2018   Acute lower UTI 02/19/2017   UTI (urinary tract infection) 02/19/2017   Left inguinal hernia 08/12/2016   Elevated troponin 05/08/2016   Anal fissure 03/15/2016   External hemorrhoid 03/15/2016   Acute viral pharyngitis 03/20/2015   Olecranon bursitis of left elbow 12/23/2014   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   Carotid stenosis 10/15/2014   Stenosis of right subclavian artery 09/19/2014   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   PAD (peripheral artery disease) 08/23/2014   Preventative health care 08/12/2014   Occlusion and stenosis of carotid artery without mention of cerebral infarction 07/25/2014   Subclavian steal syndrome 07/25/2014   CAD (coronary artery disease) 07/10/2014   Nocturia 04/17/2014   Testicular pain, right 04/17/2014   Urethral stricture 04/17/2014   Cerebral infarction (HCC) 04/10/2014   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   Hematuria 08/06/2013   Compression fracture of L2, with back and right groin pain 07/18/2013   History of colon polyps  05/18/2012   COPD (chronic obstructive pulmonary disease) (HCC) 12/24/2009   Hyperlipemia 09/20/2006   TOBACCO ABUSE 09/20/2006   Essential hypertension 09/20/2006   GERD 09/20/2006    Social History   Tobacco Use   Smoking status: Former    Current packs/day:  0.00    Average packs/day: 2.0 packs/day for 48.0 years (96.0 ttl pk-yrs)    Types: Cigarettes    Start date: 11/22/1972    Quit date: 11/22/2020    Years since quitting: 4.0   Smokeless tobacco: Never  Substance Use Topics   Alcohol use: No    Alcohol/week: 0.0 standard drinks of alcohol    Allergies[1]  Active Medications[2]  Immunization History  Administered Date(s) Administered   DTaP 12/04/2018   Fluad Quad(high Dose 65+) 10/08/2021   INFLUENZA, HIGH DOSE SEASONAL PF 10/08/2021   Influenza, Seasonal, Injecte, Preservative Fre 08/01/2013, 08/12/2014, 08/26/2015, 08/06/2016, 09/26/2019   Influenza,inj,Quad PF,6+ Mos 08/01/2013, 08/12/2014   Influenza-Unspecified 10/30/2018, 09/26/2019, 09/10/2020   Moderna Sars-Covid-2 Vaccination 02/06/2020, 03/05/2020   PNEUMOCOCCAL CONJUGATE-20 01/27/2022   Pneumococcal Polysaccharide-23 10/20/2014   Pneumococcal-Unspecified 10/20/2014        Objective:     Vitals:   12/13/24 0854  BP: 108/64  Pulse: 73  Temp: 98.1 F (36.7 C)  Height: 5' 11 (1.803 m)  Weight: 167 lb 12.8 oz (76.1 kg)  SpO2: 95% Comment: 3L  TempSrc: Temporal  BMI (Calculated): 23.41     SpO2: 95 % (3L)  GENERAL: HEAD: Normocephalic, atraumatic.  EYES: Pupils equal, round, reactive to light.  No scleral icterus.  MOUTH:  NECK: Supple. No thyromegaly. Trachea midline. No JVD.  No adenopathy. PULMONARY: Good air entry bilaterally.  No adventitious sounds. CARDIOVASCULAR: S1 and S2. Regular rate and rhythm.  ABDOMEN: MUSCULOSKELETAL: No joint deformity, no clubbing, no edema.  NEUROLOGIC:  SKIN: Intact,warm,dry. PSYCH:        Assessment & Plan:     ICD-10-CM   1. Stage 3 severe COPD by GOLD classification (HCC)  J44.9     2. COPD with asthma (HCC)  J44.89     3. Chronic respiratory failure with hypoxia (HCC)  J96.11     4. Grade II diastolic dysfunction  I51.89     5. Anemia, unspecified type  D64.9       No orders of the  defined types were placed in this encounter.   No orders of the defined types were placed in this encounter.     Advised if symptoms do not improve or worsen, to please contact office for sooner follow up or seek emergency care.    I spent xxx minutes of dedicated to the care of this patient on the date of this encounter to include pre-visit review of records, face-to-face time with the patient discussing conditions above, post visit ordering of testing, clinical documentation with the electronic health record, making appropriate referrals as documented, and communicating necessary findings to members of the patients care team.     C. Leita Sanders, MD Advanced Bronchoscopy PCCM [facility]    *This note was generated using voice recognition software/Dragon and/or AI transcription program.  Despite best efforts to proofread, errors can occur which can change the meaning. Any transcriptional errors that result from this process are unintentional and may not be fully corrected at the time of dictation.     [1] Allergies Allergen Reactions   Bee Venom Swelling   Tapentadol Shortness Of Breath    Other Reaction(s): sob/tachycardia  [2] Current Meds  Medication  Sig   albuterol  (VENTOLIN  HFA) 108 (90 Base) MCG/ACT inhaler Inhale 2 puffs into the lungs every 4 (four) hours as needed for wheezing or shortness of breath.   amLODipine  (NORVASC ) 5 MG tablet Take 5 mg by mouth daily.   amoxicillin -clavulanate (AUGMENTIN ) 875-125 MG tablet Take 1 tablet by mouth every 12 (twelve) hours for 3 days.   aspirin  EC 81 MG tablet Take 81 mg by mouth daily.   atorvastatin  (LIPITOR ) 80 MG tablet TAKE ONE TABLET BY MOUTH ONCE DAILY AT BEDTIME   BREZTRI  AEROSPHERE 160-9-4.8 MCG/ACT AERO inhaler INHALE TWO PUFFS into THE lungs TWICE DAILY morning & bedtime   clopidogrel  (PLAVIX ) 75 MG tablet TAKE ONE TABLET BY MOUTH ONCE DAILY   colchicine  0.6 MG tablet Take 1 tablet (0.6  mg total) by mouth daily.   cyanocobalamin  1000 MCG tablet Take 1 tablet (1,000 mcg total) by mouth daily.   EPINEPHrine  0.3 mg/0.3 mL IJ SOAJ injection Inject 0.3 mg into the muscle as needed for anaphylaxis.   famotidine  (PEPCID ) 40 MG tablet TAKE ONE TABLET BY MOUTH ONCE DAILY   ferrous sulfate  325 (65 FE) MG tablet Take 1 tablet (325 mg total) by mouth daily with breakfast.   FLUoxetine  (PROZAC ) 40 MG capsule Take 40 mg by mouth daily.   furosemide  (LASIX ) 20 MG tablet Take 20 mg by mouth daily.   ipratropium-albuterol  (DUONEB) 0.5-2.5 (3) MG/3ML SOLN Take 3 mLs by nebulization every 6 (six) hours as needed.   isosorbide  mononitrate (IMDUR ) 30 MG 24 hr tablet Take 30 mg by mouth daily.   levocetirizine (XYZAL ) 5 MG tablet Take 5 mg by mouth at bedtime.   linaclotide  (LINZESS ) 290 MCG CAPS capsule Take 290 mcg by mouth daily before breakfast.   lisinopril -hydrochlorothiazide  (ZESTORETIC ) 20-25 MG tablet Take 1 tablet by mouth daily.   metFORMIN  (GLUCOPHAGE ) 500 MG tablet Take 500 mg by mouth daily.   montelukast  (SINGULAIR ) 10 MG tablet Take 1 tablet (10 mg total) by mouth daily.   Multiple Vitamin (MULTI-VITAMINS) TABS Take 1 tablet by mouth daily.   naloxone  (NARCAN ) nasal spray 4 mg/0.1 mL Place 0.4 mg into the nose once.   nitroGLYCERIN  (NITROSTAT ) 0.4 MG SL tablet DISSOLVE (1) TABLET UNDER TONGUE AS NEEDED TO RELIEVE CHEST PAIN. MAY REPEAT EVERY 5 MINUTES.   ondansetron  (ZOFRAN ) 4 MG tablet Take 4 mg by mouth every 8 (eight) hours as needed for vomiting or nausea.   oxyCODONE  (ROXICODONE ) 15 MG immediate release tablet Take 1 tablet (15 mg total) by mouth every 4 (four) hours.   OXYGEN Inhale 3 L into the lungs.   pantoprazole  (PROTONIX ) 40 MG tablet Take 1 tablet (40 mg total) by mouth 2 (two) times daily before a meal. MUST SCHEDULE VISIT   polyethylene glycol (MIRALAX  / GLYCOLAX ) packet Take 17 g by mouth daily as needed (for constipation).   rOPINIRole  (REQUIP )  1 MG tablet Take 1 tablet by mouth at bedtime.   spironolactone  (ALDACTONE ) 25 MG tablet TAKE ONE TABLET BY MOUTH ONCE DAILY   tamsulosin  (FLOMAX ) 0.4 MG CAPS capsule TAKE ONE CAPSULE BY MOUTH EVERY DAY  "

## 2025-01-03 ENCOUNTER — Ambulatory Visit: Admitting: Pulmonary Disease
